# Patient Record
Sex: Female | Born: 1950 | Race: White | Hispanic: No | Marital: Married | Smoking: Never smoker
Health system: Southern US, Community
[De-identification: ages and names within clinical notes are randomized; demographics above are authoritative.]

## PROBLEM LIST (undated history)

## (undated) DIAGNOSIS — I351 Nonrheumatic aortic (valve) insufficiency: Secondary | ICD-10-CM

## (undated) DIAGNOSIS — I447 Left bundle-branch block, unspecified: Secondary | ICD-10-CM

## (undated) DIAGNOSIS — M810 Age-related osteoporosis without current pathological fracture: Secondary | ICD-10-CM

## (undated) DIAGNOSIS — R011 Cardiac murmur, unspecified: Secondary | ICD-10-CM

## (undated) DIAGNOSIS — K222 Esophageal obstruction: Secondary | ICD-10-CM

## (undated) DIAGNOSIS — Z8041 Family history of malignant neoplasm of ovary: Secondary | ICD-10-CM

## (undated) DIAGNOSIS — L309 Dermatitis, unspecified: Secondary | ICD-10-CM

## (undated) DIAGNOSIS — M797 Fibromyalgia: Secondary | ICD-10-CM

## (undated) DIAGNOSIS — H4020X0 Unspecified primary angle-closure glaucoma, stage unspecified: Secondary | ICD-10-CM

## (undated) DIAGNOSIS — M542 Cervicalgia: Secondary | ICD-10-CM

## (undated) DIAGNOSIS — I071 Rheumatic tricuspid insufficiency: Secondary | ICD-10-CM

## (undated) DIAGNOSIS — C50919 Malignant neoplasm of unspecified site of unspecified female breast: Secondary | ICD-10-CM

## (undated) DIAGNOSIS — Z808 Family history of malignant neoplasm of other organs or systems: Secondary | ICD-10-CM

## (undated) DIAGNOSIS — I700 Atherosclerosis of aorta: Secondary | ICD-10-CM

## (undated) DIAGNOSIS — I371 Nonrheumatic pulmonary valve insufficiency: Secondary | ICD-10-CM

## (undated) DIAGNOSIS — F419 Anxiety disorder, unspecified: Secondary | ICD-10-CM

## (undated) DIAGNOSIS — R918 Other nonspecific abnormal finding of lung field: Secondary | ICD-10-CM

## (undated) DIAGNOSIS — K219 Gastro-esophageal reflux disease without esophagitis: Secondary | ICD-10-CM

## (undated) DIAGNOSIS — Z800 Family history of malignant neoplasm of digestive organs: Secondary | ICD-10-CM

## (undated) DIAGNOSIS — N879 Dysplasia of cervix uteri, unspecified: Secondary | ICD-10-CM

## (undated) DIAGNOSIS — J309 Allergic rhinitis, unspecified: Secondary | ICD-10-CM

## (undated) DIAGNOSIS — K589 Irritable bowel syndrome without diarrhea: Secondary | ICD-10-CM

## (undated) DIAGNOSIS — K449 Diaphragmatic hernia without obstruction or gangrene: Secondary | ICD-10-CM

## (undated) DIAGNOSIS — I059 Rheumatic mitral valve disease, unspecified: Secondary | ICD-10-CM

## (undated) DIAGNOSIS — I493 Ventricular premature depolarization: Secondary | ICD-10-CM

## (undated) DIAGNOSIS — Z8049 Family history of malignant neoplasm of other genital organs: Secondary | ICD-10-CM

## (undated) DIAGNOSIS — I491 Atrial premature depolarization: Secondary | ICD-10-CM

## (undated) DIAGNOSIS — L719 Rosacea, unspecified: Secondary | ICD-10-CM

## (undated) DIAGNOSIS — Z8042 Family history of malignant neoplasm of prostate: Secondary | ICD-10-CM

## (undated) HISTORY — DX: Family history of malignant neoplasm of prostate: Z80.42

## (undated) HISTORY — DX: Allergic rhinitis, unspecified: J30.9

## (undated) HISTORY — DX: Malignant neoplasm of unspecified site of unspecified female breast: C50.919

## (undated) HISTORY — DX: Family history of malignant neoplasm of other organs or systems: Z80.8

## (undated) HISTORY — PX: ABDOMINAL HYSTERECTOMY: SHX81

## (undated) HISTORY — DX: Atherosclerosis of aorta: I70.0

## (undated) HISTORY — DX: Dermatitis, unspecified: L30.9

## (undated) HISTORY — DX: Family history of malignant neoplasm of other genital organs: Z80.49

## (undated) HISTORY — DX: Family history of malignant neoplasm of ovary: Z80.41

## (undated) HISTORY — PX: NECK SURGERY: SHX720

## (undated) HISTORY — DX: Other nonspecific abnormal finding of lung field: R91.8

## (undated) HISTORY — DX: Cardiac murmur, unspecified: R01.1

## (undated) HISTORY — DX: Fibromyalgia: M79.7

## (undated) HISTORY — DX: Gastro-esophageal reflux disease without esophagitis: K21.9

## (undated) HISTORY — DX: Left bundle-branch block, unspecified: I44.7

## (undated) HISTORY — DX: Rheumatic mitral valve disease, unspecified: I05.9

## (undated) HISTORY — DX: Nonrheumatic aortic (valve) insufficiency: I35.1

## (undated) HISTORY — DX: Ventricular premature depolarization: I49.3

## (undated) HISTORY — PX: EYE SURGERY: SHX253

## (undated) HISTORY — DX: Unspecified primary angle-closure glaucoma, stage unspecified: H40.20X0

## (undated) HISTORY — DX: Age-related osteoporosis without current pathological fracture: M81.0

## (undated) HISTORY — DX: Esophageal obstruction: K22.2

## (undated) HISTORY — DX: Rheumatic tricuspid insufficiency: I07.1

## (undated) HISTORY — DX: Diaphragmatic hernia without obstruction or gangrene: K44.9

## (undated) HISTORY — PX: REFRACTIVE SURGERY: SHX103

## (undated) HISTORY — DX: Cervicalgia: M54.2

## (undated) HISTORY — PX: PARACENTESIS: SHX844

## (undated) HISTORY — DX: Irritable bowel syndrome, unspecified: K58.9

## (undated) HISTORY — DX: Family history of malignant neoplasm of digestive organs: Z80.0

## (undated) HISTORY — PX: CERVICAL DISCECTOMY: SHX98

## (undated) HISTORY — DX: Nonrheumatic pulmonary valve insufficiency: I37.1

## (undated) HISTORY — DX: Atrial premature depolarization: I49.1

## (undated) HISTORY — DX: Dysplasia of cervix uteri, unspecified: N87.9

---

## 1974-03-17 HISTORY — PX: RHINOPLASTY: SUR1284

## 1975-03-18 HISTORY — PX: TUBAL LIGATION: SHX77

## 1978-03-17 DIAGNOSIS — N879 Dysplasia of cervix uteri, unspecified: Secondary | ICD-10-CM

## 1978-03-17 HISTORY — PX: GYNECOLOGIC CRYOSURGERY: SHX857

## 1978-03-17 HISTORY — DX: Dysplasia of cervix uteri, unspecified: N87.9

## 1996-03-17 HISTORY — PX: BREAST LUMPECTOMY: SHX2

## 1996-10-15 HISTORY — PX: OTHER SURGICAL HISTORY: SHX169

## 1997-12-19 ENCOUNTER — Other Ambulatory Visit: Admission: RE | Admit: 1997-12-19 | Discharge: 1997-12-19 | Payer: Self-pay | Admitting: Obstetrics and Gynecology

## 1999-01-31 ENCOUNTER — Encounter: Admission: RE | Admit: 1999-01-31 | Discharge: 1999-01-31 | Payer: Self-pay | Admitting: Neurosurgery

## 1999-01-31 ENCOUNTER — Encounter: Payer: Self-pay | Admitting: Neurosurgery

## 1999-02-14 ENCOUNTER — Other Ambulatory Visit: Admission: RE | Admit: 1999-02-14 | Discharge: 1999-02-14 | Payer: Self-pay | Admitting: Obstetrics and Gynecology

## 1999-04-22 ENCOUNTER — Encounter: Admission: RE | Admit: 1999-04-22 | Discharge: 1999-04-22 | Payer: Self-pay

## 2000-02-18 ENCOUNTER — Other Ambulatory Visit: Admission: RE | Admit: 2000-02-18 | Discharge: 2000-02-18 | Payer: Self-pay | Admitting: Obstetrics and Gynecology

## 2001-02-19 ENCOUNTER — Other Ambulatory Visit: Admission: RE | Admit: 2001-02-19 | Discharge: 2001-02-19 | Payer: Self-pay | Admitting: Obstetrics and Gynecology

## 2001-06-30 ENCOUNTER — Ambulatory Visit (HOSPITAL_COMMUNITY): Admission: RE | Admit: 2001-06-30 | Discharge: 2001-06-30 | Payer: Self-pay | Admitting: Gastroenterology

## 2001-06-30 ENCOUNTER — Encounter: Payer: Self-pay | Admitting: Gastroenterology

## 2001-07-30 ENCOUNTER — Encounter: Admission: RE | Admit: 2001-07-30 | Discharge: 2001-07-30 | Payer: Self-pay | Admitting: Gastroenterology

## 2001-07-30 ENCOUNTER — Encounter: Payer: Self-pay | Admitting: Gastroenterology

## 2002-03-04 ENCOUNTER — Other Ambulatory Visit: Admission: RE | Admit: 2002-03-04 | Discharge: 2002-03-04 | Payer: Self-pay | Admitting: Obstetrics and Gynecology

## 2003-03-07 ENCOUNTER — Other Ambulatory Visit: Admission: RE | Admit: 2003-03-07 | Discharge: 2003-03-07 | Payer: Self-pay | Admitting: Obstetrics and Gynecology

## 2004-01-06 ENCOUNTER — Ambulatory Visit (HOSPITAL_COMMUNITY): Admission: RE | Admit: 2004-01-06 | Discharge: 2004-01-06 | Payer: Self-pay | Admitting: Orthopedic Surgery

## 2004-01-17 ENCOUNTER — Encounter: Admission: RE | Admit: 2004-01-17 | Discharge: 2004-01-17 | Payer: Self-pay | Admitting: Orthopedic Surgery

## 2004-05-02 ENCOUNTER — Other Ambulatory Visit: Admission: RE | Admit: 2004-05-02 | Discharge: 2004-05-02 | Payer: Self-pay | Admitting: Obstetrics and Gynecology

## 2004-06-28 ENCOUNTER — Ambulatory Visit (HOSPITAL_COMMUNITY): Admission: RE | Admit: 2004-06-28 | Discharge: 2004-06-28 | Payer: Self-pay | Admitting: Orthopedic Surgery

## 2004-07-02 ENCOUNTER — Ambulatory Visit: Payer: Self-pay | Admitting: Gastroenterology

## 2004-07-08 ENCOUNTER — Ambulatory Visit: Payer: Self-pay | Admitting: Gastroenterology

## 2004-10-27 ENCOUNTER — Emergency Department (HOSPITAL_COMMUNITY): Admission: EM | Admit: 2004-10-27 | Discharge: 2004-10-27 | Payer: Self-pay | Admitting: Emergency Medicine

## 2005-06-24 ENCOUNTER — Other Ambulatory Visit: Admission: RE | Admit: 2005-06-24 | Discharge: 2005-06-24 | Payer: Self-pay | Admitting: Obstetrics and Gynecology

## 2005-09-09 ENCOUNTER — Ambulatory Visit: Payer: Self-pay | Admitting: Gastroenterology

## 2006-04-27 ENCOUNTER — Ambulatory Visit: Payer: Self-pay | Admitting: Gastroenterology

## 2006-05-13 ENCOUNTER — Ambulatory Visit: Payer: Self-pay | Admitting: Gastroenterology

## 2006-06-30 ENCOUNTER — Other Ambulatory Visit: Admission: RE | Admit: 2006-06-30 | Discharge: 2006-06-30 | Payer: Self-pay | Admitting: Obstetrics and Gynecology

## 2007-02-23 ENCOUNTER — Ambulatory Visit: Payer: Self-pay | Admitting: Gastroenterology

## 2007-03-23 ENCOUNTER — Ambulatory Visit: Payer: Self-pay | Admitting: Gastroenterology

## 2007-04-20 ENCOUNTER — Ambulatory Visit: Payer: Self-pay | Admitting: Gastroenterology

## 2007-04-20 LAB — CONVERTED CEMR LAB
Ferritin: 51.1 ng/mL (ref 10.0–291.0)
Folate: >20 ng/mL
Iron: 70 ug/dL (ref 42–145)
Saturation Ratios: 18.3 % — ABNORMAL LOW (ref 20.0–50.0)
Transferrin: 273.3 mg/dL (ref >=212.0)
Vitamin B-12: 630 pg/mL (ref 211–911)

## 2007-04-29 ENCOUNTER — Ambulatory Visit (HOSPITAL_COMMUNITY): Admission: RE | Admit: 2007-04-29 | Discharge: 2007-04-29 | Payer: Self-pay | Admitting: Gastroenterology

## 2007-05-04 ENCOUNTER — Ambulatory Visit (HOSPITAL_COMMUNITY): Admission: RE | Admit: 2007-05-04 | Discharge: 2007-05-04 | Payer: Self-pay | Admitting: Family Medicine

## 2007-07-20 ENCOUNTER — Other Ambulatory Visit: Admission: RE | Admit: 2007-07-20 | Discharge: 2007-07-20 | Payer: Self-pay | Admitting: Obstetrics and Gynecology

## 2008-04-17 DIAGNOSIS — M542 Cervicalgia: Secondary | ICD-10-CM | POA: Insufficient documentation

## 2008-04-17 DIAGNOSIS — Z853 Personal history of malignant neoplasm of breast: Secondary | ICD-10-CM | POA: Insufficient documentation

## 2008-04-17 DIAGNOSIS — K449 Diaphragmatic hernia without obstruction or gangrene: Secondary | ICD-10-CM | POA: Insufficient documentation

## 2008-04-17 DIAGNOSIS — C50919 Malignant neoplasm of unspecified site of unspecified female breast: Secondary | ICD-10-CM

## 2008-04-17 DIAGNOSIS — K222 Esophageal obstruction: Secondary | ICD-10-CM | POA: Insufficient documentation

## 2008-04-17 DIAGNOSIS — I341 Nonrheumatic mitral (valve) prolapse: Secondary | ICD-10-CM | POA: Insufficient documentation

## 2008-04-17 DIAGNOSIS — I059 Rheumatic mitral valve disease, unspecified: Secondary | ICD-10-CM | POA: Insufficient documentation

## 2008-04-17 DIAGNOSIS — G609 Hereditary and idiopathic neuropathy, unspecified: Secondary | ICD-10-CM | POA: Insufficient documentation

## 2008-04-18 ENCOUNTER — Ambulatory Visit: Payer: Self-pay | Admitting: Gastroenterology

## 2008-04-24 ENCOUNTER — Ambulatory Visit (HOSPITAL_COMMUNITY): Admission: RE | Admit: 2008-04-24 | Discharge: 2008-04-24 | Payer: Self-pay | Admitting: Neurosurgery

## 2008-04-25 ENCOUNTER — Encounter: Payer: Self-pay | Admitting: Gastroenterology

## 2008-05-04 ENCOUNTER — Ambulatory Visit (HOSPITAL_COMMUNITY): Admission: RE | Admit: 2008-05-04 | Discharge: 2008-05-05 | Payer: Self-pay | Admitting: Neurosurgery

## 2008-05-05 ENCOUNTER — Telehealth: Payer: Self-pay | Admitting: Gastroenterology

## 2008-05-08 ENCOUNTER — Telehealth: Payer: Self-pay | Admitting: Gastroenterology

## 2008-08-02 ENCOUNTER — Encounter: Payer: Self-pay | Admitting: Obstetrics and Gynecology

## 2008-08-02 ENCOUNTER — Other Ambulatory Visit: Admission: RE | Admit: 2008-08-02 | Discharge: 2008-08-02 | Payer: Self-pay | Admitting: Obstetrics and Gynecology

## 2008-08-02 ENCOUNTER — Ambulatory Visit: Payer: Self-pay | Admitting: Obstetrics and Gynecology

## 2008-08-03 ENCOUNTER — Ambulatory Visit: Payer: Self-pay | Admitting: Obstetrics and Gynecology

## 2009-06-26 ENCOUNTER — Ambulatory Visit: Payer: Self-pay | Admitting: Gastroenterology

## 2009-06-26 LAB — CONVERTED CEMR LAB
Ferritin: 62.6 ng/mL (ref 10.0–291.0)
Folate: 20 ng/mL
Iron: 86 ug/dL (ref 42–145)
Saturation Ratios: 20.7 % (ref 20.0–50.0)
Transferrin: 297 mg/dL (ref 212.0–360.0)
Vitamin B-12: 528 pg/mL (ref 211–911)

## 2009-06-28 ENCOUNTER — Encounter: Payer: Self-pay | Admitting: Gastroenterology

## 2009-07-12 ENCOUNTER — Telehealth: Payer: Self-pay | Admitting: Gastroenterology

## 2009-08-07 ENCOUNTER — Ambulatory Visit: Payer: Self-pay | Admitting: Obstetrics and Gynecology

## 2009-08-07 ENCOUNTER — Other Ambulatory Visit: Admission: RE | Admit: 2009-08-07 | Discharge: 2009-08-07 | Payer: Self-pay | Admitting: Obstetrics and Gynecology

## 2009-10-03 ENCOUNTER — Encounter: Payer: Self-pay | Admitting: Gastroenterology

## 2009-10-03 ENCOUNTER — Telehealth: Payer: Self-pay | Admitting: Gastroenterology

## 2009-10-04 ENCOUNTER — Telehealth: Payer: Self-pay | Admitting: Gastroenterology

## 2009-10-11 ENCOUNTER — Ambulatory Visit: Payer: Self-pay | Admitting: Obstetrics and Gynecology

## 2009-11-07 ENCOUNTER — Ambulatory Visit: Payer: Self-pay | Admitting: Obstetrics and Gynecology

## 2009-11-13 ENCOUNTER — Ambulatory Visit: Payer: Self-pay | Admitting: Obstetrics and Gynecology

## 2009-11-15 ENCOUNTER — Ambulatory Visit: Payer: Self-pay | Admitting: Obstetrics and Gynecology

## 2009-11-21 ENCOUNTER — Ambulatory Visit: Payer: Self-pay | Admitting: Obstetrics and Gynecology

## 2010-03-17 DIAGNOSIS — I491 Atrial premature depolarization: Secondary | ICD-10-CM

## 2010-03-17 HISTORY — DX: Atrial premature depolarization: I49.1

## 2010-04-06 ENCOUNTER — Encounter: Payer: Self-pay | Admitting: Orthopedic Surgery

## 2010-04-06 ENCOUNTER — Encounter: Payer: Self-pay | Admitting: General Surgery

## 2010-04-18 NOTE — Progress Notes (Signed)
Summary: Triage  Medications Added SUCRALFATE 1 GM/10ML SUSP (SUCRALFATE) 1 gm three times a day after meals, Take one day before taking Atelvia and 3 days after.       Phone Note Outgoing Call   Call placed by: Ashok Cordia RN,  October 03, 2009 3:22 PM Summary of Call: Received note from pt.  Gyn has put her on Atevia (risedronate sodium) 35 mg weekly.  Pt is having an uncomfortable feeling in esophagus for a few days after taking med.  Asking if Dr. Jarold Motto thinks it is OK for pt to take this medication? Initial call taken by: Ashok Cordia RN,  October 03, 2009 3:24 PM  Follow-up for Phone Call        Per Dr. Jarold Motto:  Take carafate 1 gm pc three times a day for 1 day before and 3 days after taking Rx. Follow-up by: Ashok Cordia RN,  October 03, 2009 3:25 PM  Additional Follow-up for Phone Call Additional follow up Details #1::        LM for pt to call.  Lupita Leash Surface RN  October 03, 2009 3:25 PM  Pt notified.  Will try carafate and report back. Additional Follow-up by: Ashok Cordia RN,  October 03, 2009 3:46 PM    New/Updated Medications: SUCRALFATE 1 GM/10ML SUSP (SUCRALFATE) 1 gm three times a day after meals, Take one day before taking Atelvia and 3 days after. Prescriptions: SUCRALFATE 1 GM/10ML SUSP (SUCRALFATE) 1 gm three times a day after meals, Take one day before taking Atelvia and 3 days after.  #14 oz x 3   Entered by:   Ashok Cordia RN   Authorized by:   Mardella Layman MD [facility]   Signed by:   Ashok Cordia RN on 10/03/2009   Method used:   [facility]  [name] [address] [facility]       [name]       [place], [place]  [address]       Ph: [phone] or [phone]       Fax: [phone]   RxID:   [id number]

## 2010-04-18 NOTE — Medication Information (Signed)
Summary: Approved/[employer]  Approved/[employer]   Imported By: Lester Bronson 07/04/2009 08:26:16  _____________________________________________________________________  External Attachment:    Type:   Image     Comment:   External Document

## 2010-04-18 NOTE — Procedures (Signed)
Summary: Egd   EGD  Procedure date:  06/30/2001  Findings:      Location: [facility]    Patient Name: [name] [name], [name] [name] MRN: [id number] Procedure Procedures: Panendoscopy (EGD) CPT: 43235.    with esophageal dilation. CPT: G9296129.  Personnel: Endoscopist: [name]. Jarold Motto, MD.  Exam Location: Exam performed in Endoscopy Suite.  Patient Consent: Procedure, Alternatives, Risks and Benefits discussed, consent obtained,  Indications Symptoms: Dysphagia. Reflux symptoms  History  Pre-Exam Physical: Performed Jun 30, 2001  Cardio-pulmonary exam, Abdominal exam, Extremity exam, Mental status exam WNL.  Exam Exam Info: Maximum depth of insertion Duodenum, intended Duodenum. Patient position: on left side. Duration of exam: 20 minutes. Vocal cords visualized. Gastric retroflexion performed. Images taken. ASA Classification: I. Tolerance: excellent.  Sedation Meds: Cetacaine Spray 2 sprays Versed 8 mg.  Monitoring: BP and pulse monitoring done. Oximetry used. Supplemental O2 given  Fluoroscopy: Fluoroscopy was not used.  Instrument(s): PCF 140L. Serial [id number].   Findings - Normal: Proximal Esophagus.  - HIATAL HERNIA: Prolapsing, 6 cms. in length. ICD9: Hernia, Hiatal: 553.3. - STRICTURE / STENOSIS: Distal Esophagus.  Constriction: partial. Lumen diameter is 15 mm. ICD9: Esophageal Stricture: 530.3.  - Dilation: Distal Esophagus. for esophageal stricture. Savary dilator used, Diameter: 18 mm, No Resistance, No Heme present on extraction. 1  total dilators used. Patient tolerance excellent. Outcome: successful.  - Normal: Body to Duodenal 2nd Portion. Not Seen: Ulcer. Mucosal abnormality.   Assessment  Diagnoses: 553.3: Hernia, Hiatal. GERD.  530.3: Esophageal Stricture.   Events  Unplanned Intervention: No unplanned interventions were required.  Plans Medication(s): PPI: Omeprazole/Prilosec 40 mg prn, starting Jun 30, 2001 for indefinitely.    Patient Education: Patient given standard instructions for: Reflux. Stenosis / Stricture. Soft diet for 24 hours.  Disposition: After procedure patient sent to recovery.  Scheduling: Call office for appointment, to [name]. Jarold Motto, MD, around Jul 30, 2001.    This report was created from the original endoscopy report, which was reviewed and signed by the above listed endoscopist.

## 2010-04-18 NOTE — Assessment & Plan Note (Signed)
Summary: YEARLY FUP FOR PROTONIX REFILL/YF    History of Present Illness Visit Type: Follow-up Visit Primary GI MD: Sheryn Bison MD FACP FAGA Primary Provider: Foy Guadalajara Chief Complaint: Patient here for yearly follow up of her reflux and needs Protonix refills. History of Present Illness:   [name] [name] has Raynaud's phenomenon and chronic GERD controlled on Protonix 40 mg one to 2 times a day. She recently has been bothered with Roscea in the care of Dr. Dorinda Hill. She has multiple drug allergies to various antibiotics. 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 Rapid good her weight is stable. She denies hepatobiliary or lower gastrointestinal complaints.   GI Review of Systems    Reports acid reflux.      Denies abdominal pain, belching, bloating, chest pain, dysphagia with liquids, dysphagia with solids, heartburn, loss of appetite, nausea, vomiting, vomiting blood, weight loss, and  weight gain.        Denies anal fissure, black tarry stools, change in bowel habit, constipation, diarrhea, diverticulosis, fecal incontinence, heme positive stool, hemorrhoids, irritable bowel syndrome, jaundice, light color stool, liver problems, rectal bleeding, and  rectal pain.    Current Medications (verified): 1)  Protonix 40 Mg Pack (Pantoprazole Sodium) .... Take One By Mouth Two Times A Day 2)  Align  Caps (Misc Intestinal Flora Regulat) .... Once Daily 3)  Caltrate 600+d 600-400 Mg-Unit Tabs (Calcium Carbonate-Vitamin D) .... Take One By Mouth Two Times A Day 4)  Vitamin C Cr 500 Mg Cr-Caps (Ascorbic Acid) .... Once Daily 5)  Protopic 0.1 % Oint (Tacrolimus) .... Apply To Face At Bedtime 6)  Vitamin D3 400 Unit Tabs (Cholecalciferol) .... Take One By Mouth Two Times A Day 7)  B Complex  Tabs (B Complex Vitamins) .... Take One By Mouth Once Daily 8)  Nasalcrom 5.2 Mg/act Aers (Cromolyn  Sodium) .... One Spray One Nostril  Three Times A Day 9)  Rhinocort Aqua 32 Mcg/act Susp (Budesonide) .... One Spray One Nostril Once A Day 10)  Ketoconazole 2 % Crea (Ketoconazole) .... Apply To Face Once A Day  Allergies: 1)  ! Penicillin 2)  ! Sulfa 3)  ! * Emycin 4)  ! Flagyl 5)  ! Doxycycline 6)  ! Quinaglute 7)  ! * Thimerosal 8)  ! * Tramadol 9)  ! * Ditropan 10)  ! * Mincycline  Past History:  Past medical, surgical, family and social histories (including risk factors) reviewed for relevance to current acute and chronic problems.  Past Medical History: Reviewed history from 04/18/2008 and no changes required. Current Problems:  MITRAL VALVE PROLAPSE (ICD-424.0) DEPRESSION (ICD-311) ADENOCARCINOMA, BREAST, RIGHT (ICD-174.9) NECK PAIN (ICD-723.1) PERIPHERAL NEUROPATHY (ICD-356.9) OSTEOPOROSIS (ICD-733.00) GERD (ICD-530.81) IBS (ICD-564.1) ESOPHAGEAL STRICTURE (ICD-530.3) HIATAL HERNIA (ICD-553.3)  Past Surgical History: neck surgery with bone graft and fusion [id number] Rhinoplasty 1976 Tubal Ligation 1977 Cyrosurgery 1980 Right breast lumpectomy with radiation 1998 Laser surgery for narrow angle glucoma anterior cervical discectomy c3-4 & c4-5 with bone graft and fusion  Family History: Reviewed history from 04/17/2008 and no changes required. Family History of Pancreatic Cancer:grandfather Family History of Uterine Cancer:sister Family History of Heart Disease: mother, grandfather, father, sister No FH of Colon Cancer Family History of ETOH Abuse: Sister, mother Family History of Depression: mother, sister sister deceased of melanoma  Social History: Reviewed history from 04/17/2008 and no changes required. Married Patient has never smoked.  Alcohol Use - yes Illicit Drug Use -  no  Review of Systems       The patient complains of back pain.  The patient denies allergy/sinus, anemia, anxiety-new, arthritis/joint pain, blood in urine, breast  changes/lumps, change in vision, confusion, cough, coughing up blood, depression-new, fainting, fatigue, fever, headaches-new, hearing problems, heart murmur, heart rhythm changes, itching, menstrual pain, muscle pains/cramps, night sweats, nosebleeds, pregnancy symptoms, shortness of breath, skin rash, sleeping problems, sore throat, swelling of feet/legs, swollen lymph glands, thirst - excessive , urination - excessive , urination changes/pain, urine leakage, vision changes, and voice change.         She has had neck fusion surgery also occasions and does have some mild dysphasia and her upper esophageal area which is chronic in nature.  Vital Signs:  Patient profile:   60 year old female Height:      65 inches Weight:      113.2 pounds BMI:     18.91 Pulse rate:   880 / minute Pulse rhythm:   regular BP sitting:   140 / 66  (left arm) Cuff size:   regular  Vitals Entered By: Harlow Mares CMA Duncan Dull) (June 26, 2009 1:50 PM)  Physical Exam  General:  Well developed, well nourished, no acute distress.healthy appearing and tall statured.  healthy appearing.   Head:  Normocephalic and atraumatic. Eyes:  PERRLA, no icterus.exam deferred to patient's ophthalmologist.  exam deferred to patient's ophthalmologist.   Neck:  Neck deformity with anterior displacement of her cartilage. I cannot appreciate thyromegaly or other masses or tenderness. Abdomen:  Soft, nontender and nondistended. No masses, hepatosplenomegaly or hernias noted. Normal bowel sounds. Extremities:  No clubbing, cyanosis, edema or deformities noted. Neurologic:  Alert and  oriented x4;  grossly normal neurologically. Skin:  Changes of facial telangiectasias bilaterally over the malar areas. I cannot appreciate other skin signs of scleroderma. Psych:  Alert and cooperative. Normal mood and affect.anxious.     Impression & Recommendations:  Problem # 1:  GERD (ICD-530.81) Assessment Improved Continue antireflux maneuvers  and daily Protonix generic and twice a day as needed. I do not think followup endoscopy indicated this time. We will check B12 level per chronic use of PPI medications. 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 Orders: TLB-B12, Serum-Total ONLY ([address]-U04) TLB-Ferritin (82728-FER) TLB-Folic Acid (Folate) (82746-FOL) TLB-IBC Pnl (Iron/FE;Transferrin) (83550-IBC)  Problem # 3:  IBS (ICD-564.1) Assessment: Improved  Patient Instructions: 1)  Please go to the basement for lab work. 2)  Take pantoprazole two times a day. 3)  take Xifaxin two times a day for 10 days. 4)  The medication list was reviewed and reconciled.  All changed / newly prescribed medications were explained.  A complete medication list was provided to the patient / caregiver. 5)  Copy sent to : Dr. Dorinda Hill in dermatology and Dr. Marinda Elk in primary care. 6)  Please continue current medications.  7)  Avoid foods high in acid content ( tomatoes, citrus juices, spicy foods) . Avoid eating within 3 to 4 hours of lying down or before exercising. Do not over eat; try smaller more frequent meals. Elevate head of bed four inches when sleeping.   Appended Document: YEARLY FUP FOR PROTONIX REFILL/YF    Clinical Lists Changes  Medications: Added new medication of XIFAXAN 550 MG TABS (RIFAXIMIN)  1 by mouth two times a day - Signed Changed medication from PROTONIX 40 MG PACK (PANTOPRAZOLE SODIUM) take one by mouth two times a day to PANTOPRAZOLE SODIUM 40 MG TBEC (PANTOPRAZOLE SODIUM) 1 by mouth two times a day - Signed Rx of XIFAXAN 550 MG TABS (RIFAXIMIN) 1 by mouth two times a day;  #28 x 0;  Signed;  Entered by: Ashok Cordia RN;  Authorized by: Mardella Layman MD [facility];  Method used: Print  then Give to Patient Rx of PANTOPRAZOLE SODIUM 40 MG TBEC (PANTOPRAZOLE SODIUM) 1 by mouth two times a day;  #60 x 11;  Signed;  Entered by: Ashok Cordia RN;  Authorized by: Mardella Layman MD [facility];  Method used: Print then Give to Patient Rx of PANTOPRAZOLE SODIUM 40 MG TBEC (PANTOPRAZOLE SODIUM) 1 by mouth two times a day;  #60 x 11;  Signed;  Entered by: Ashok Cordia RN;  Authorized by: Mardella Layman MD [facility];  Method used[facility]  [name] [address] [name], [place], [place]  [address], Ph: [phone] or [phone], Fax: [phone]    Prescriptions: PANTOPRAZOLE SODIUM 40 MG TBEC (PANTOPRAZOLE SODIUM) 1 by mouth two times a day  #60 x 11   Entered by:   Ashok Cordia RN   Authorized by:   Mardella Layman MD [facility]   Signed by:   Ashok Cordia RN on 06/26/2009   Method used:   [facility]        [facility]  [name] [address] ([facility]ail)       [name]       [place], [place]  [address]       Ph: [phone] or [phone]       Fax: [phone]   RxID:   [id number] PANTOPRAZOLE SODIUM 40 MG TBEC (PANTOPRAZOLE SODIUM) 1 by mouth two times a day  #60 x 11   Entered by:   Ashok Cordia RN   Authorized by:   Mardella Layman MD [facility]   Signed by:   Ashok Cordia RN on 06/26/2009   Method used:   Print then Give to Patient   RxID:   [id number] TDDUKGU 550 MG TABS (RIFAXIMIN) 1 by mouth two times a day  #28 x 0   Entered by:   Ashok Cordia RN   Authorized by:   Mardella Layman MD [facility]   Signed by:   Ashok Cordia RN on 06/26/2009   Method used:   Print then Give to Patient   RxID:   [id number]

## 2010-04-18 NOTE — Assessment & Plan Note (Signed)
Summary: YEARLY F-UP/YF  Medications Added PROTONIX 40 MG PACK (PANTOPRAZOLE SODIUM) Take  every morning Eat 1/2 hour afterwards PROTONIX 40 MG PACK (PANTOPRAZOLE SODIUM) take one by mouth two times a day ALIGN  CAPS (MISC INTESTINAL FLORA REGULAT) once daily CALCIUM 500/D 500-400 MG-UNIT CHEW (CALCIUM-VITAMIN D) two times a day VITAMIN C CR 500 MG CR-CAPS (ASCORBIC ACID) once daily FINACEA 15 % GEL (AZELAIC ACID) at bedtime for rosacea PROTOPIC 0.1 % OINT (TACROLIMUS) Apply to face at bedtime        History of Present Illness Visit Type: follow up Primary GI MD: Sheryn Bison MD FACP FAGA Primary Provider: Foy Guadalajara Chief Complaint: follow-up visit/Yearly History of Present Illness:   [name] [name] is doing markedly well on probiotic therapy and daily Protonix. She will occasionally have acid reflux which responds to twice a day Protonix therapy. She has no associated dysphasia. Her lower bowel problems have abated with probiotic therapy and removal of fructose-sorbitol from her diet. She is to have cervical disc surgery for the third time in the next several months. She may need solutabs for acid control at that time if she has dysphasia. She denies nocturnal reflux symptoms but has not had previous 24-hour pH probe monitor sedation. Her dentist apparently feels that some of her enamel erosion is from GERD.   GI Review of Systems    Reports acid reflux and  heartburn.      Denies abdominal pain, belching, bloating, chest pain, dysphagia with liquids, dysphagia with solids, loss of appetite, nausea, vomiting, vomiting blood, weight loss, and  weight gain.        Denies anal fissure, black tarry stools, change in bowel habit, constipation, diarrhea, diverticulosis, fecal incontinence, heme positive stool, hemorrhoids, irritable bowel syndrome, jaundice, light color stool, liver problems, rectal bleeding, and  rectal pain.     Prior Medications Reviewed Using: List Brought by Patient  Updated  Prior Medication List: PROTONIX 40 MG PACK (PANTOPRAZOLE SODIUM) Take  every morning Eat 1/2 hour afterwards ALIGN  CAPS (MISC INTESTINAL FLORA REGULAT) once daily CALCIUM 500/D 500-400 MG-UNIT CHEW (CALCIUM-VITAMIN D) two times a day VITAMIN C CR 500 MG CR-CAPS (ASCORBIC ACID) once daily FINACEA 15 % GEL (AZELAIC ACID) at bedtime for rosacea PROTOPIC 0.1 % OINT (TACROLIMUS) Apply to face at bedtime  Current Allergies (reviewed today): ! PENICILLIN ! SULFA ! [facility] ! FLAGYL ! DOXYCYCLINE ! QUINAGLUTE ! * THIMEROSAL ! * TRAMADOL ! * DITROPAN  Past Medical History:    Reviewed history and no changes required:       Current Problems:        MITRAL VALVE PROLAPSE (ICD-424.0)       DEPRESSION (ICD-311)       ADENOCARCINOMA, BREAST, RIGHT (ICD-174.9)       NECK PAIN (ICD-723.1)       PERIPHERAL NEUROPATHY (ICD-356.9)       OSTEOPOROSIS (ICD-733.00)       GERD (ICD-530.81)       IBS (ICD-564.1)       ESOPHAGEAL STRICTURE (ICD-530.3)       HIATAL HERNIA (ICD-553.3)         Past Surgical History:    Reviewed history from 04/17/2008 and no changes required:       neck surgery with bone graft and fusion [id number]       Rhinoplasty 1976       Tubal Ligation 1977       Cyrosurgery 1980       Right breast lumpectomy  with radiation 1998       Laser surgery for narrow angle glucoma   Family History:    Reviewed history from 04/17/2008 and no changes required:       Family History of Pancreatic Cancer:grandfather       Family History of Uterine Cancer:sister       Family History of Heart Disease: mother, grandfather, father, sister       No FH of Colon Cancer       Family History of ETOH Abuse: Sister, mother       Family History of Depression: mother, sister  Social History:    Reviewed history from 04/17/2008 and no changes required:       Married       Patient has never smoked.        Alcohol Use - yes       Illicit Drug Use - no   Risk Factors:  Passive smoke  exposure:  no Caffeine use:  0 drinks per day    Type:  wine or beer    Drinks per day:  <1 Exercise:  no Seatbelt use:  100 %   Review of Systems  The patient denies allergy/sinus, anemia, anxiety-new, arthritis/joint pain, back pain, blood in urine, breast changes/lumps, change in vision, confusion, cough, coughing up blood, depression-new, fainting, fatigue, fever, headaches-new, hearing problems, heart murmur, heart rhythm changes, itching, menstrual pain, muscle pains/cramps, night sweats, nosebleeds, pregnancy symptoms, shortness of breath, skin rash, sleeping problems, sore throat, swelling of feet/legs, swollen lymph glands, thirst - excessive , urination - excessive , urination changes/pain, urine leakage, vision changes, and voice change.     Vital Signs:  Patient Profile:   60 Years Old Female Height:     65 inches Weight:      120.25 pounds BMI:     20.08 Pulse rate:   72 / minute Pulse rhythm:   regular BP sitting:   112 / 70  (left arm)  Vitals Entered By: June McMurray CMA (April 18, 2008 1:43 PM)                  Physical Exam  General:     Well developed, well nourished, no acute distress.healthy appearing.   Psych:     Alert and cooperative. Normal mood and affect.    Impression & Recommendations:  Problem # 1:  GERD (ICD-530.81) Assessment: Improved Continue current regime with Protonix 40 mg 30 minutes before breakfast twice a day if needed.  Problem # 2:  IBS (ICD-564.1) Assessment: Improved Continue probiotic therapy as tolerated.   Patient Instructions: 1)  Copy Sent To:Dr. Marinda Elk.Marland KitchenMarland Kitchen 2)  Please Continue current medications. 3)  Avoid foods high in acid content (tomatoes, citrus juices, spicy foods). Avoid eating within 3 to 4 hours of lying down or before exercising. Do not over eat; try smaller more frequent meals. Elevate head of bed four inches when sleeping. 4)  Consider changing to Kapidex 60 mg q.a.m. if needed for longer pH  acid control.    Prescriptions: PROTONIX 40 MG PACK (PANTOPRAZOLE SODIUM) take one by mouth two times a day  #60 x 3   Entered by:   Harlow Mares CMA   Authorized by:   Mardella Layman MD FACG,FAGA   Signed by:   Harlow Mares CMA on 04/18/2008   Method used:   [facility]        [facility]  [name] [address] ([facility])  [name]       [place], [place]  [address]       Ph: [phone] or [phone]       Fax: [phone]   RxID:   [id number]

## 2010-04-18 NOTE — Progress Notes (Signed)
Summary: RX/Triage  Medications Added PREVACID SOLUTAB 30 MG TBDP (LANSOPRAZOLE) take one by mouth once daily       Phone Note Call from Patient Call back at Home Phone [phone]   Caller: Patient Call For: [name] [name]  Reason for Call: Talk to Nurse Details for Reason: RX / triage  Summary of Call: pt just had neck surgery having trouble swallowing  and was told that Dr would be happy to call in Prevacid solutab. [facility]  [name] [address]Marland KitchenMarland Kitchenplease call pt when done  Initial call taken by: Guadlupe Spanish [facility],  May 05, 2008 11:48 AM  Follow-up for Phone Call        rx sent... No Answer at patient home number Follow-up by: Harlow Mares CMA,  May 05, 2008 12:05 PM    New/Updated Medications: PREVACID SOLUTAB 30 MG TBDP (LANSOPRAZOLE) take one by mouth once daily   Prescriptions: PREVACID SOLUTAB 30 MG TBDP (LANSOPRAZOLE) take one by mouth once daily  #30 x 1   Entered by:   Harlow Mares CMA   Authorized by:   Mardella Layman MD FACG,FAGA   Signed by:   Harlow Mares CMA on 05/05/2008   Method used:   [facility]        [facility]  [name] [address] ([facility]ail)       [name]       [place], [place]  [address]       Ph: [phone] or [phone]       Fax: [phone]   RxID:   [id number]

## 2010-04-18 NOTE — Progress Notes (Signed)
Summary: stop prior authi   Phone Note Call from Patient Call back at Home Phone [phone]   Caller: Patient Call For: [name] [name]  Reason for Call: Talk to Nurse Details for Reason: STOP prior autho Summary of Call: would like to STOP prior autho on Prevacid Solutab. pt can now swallow pills Initial call taken by: Guadlupe Spanish [facility],  May 08, 2008 8:10 AM  Follow-up for Phone Call        noted and removed from med list. Follow-up by: Harlow Mares CMA,  May 10, 2008 8:31 AM

## 2010-04-18 NOTE — Progress Notes (Signed)
Summary: results request   Phone Note Call from Patient Call back at Home Phone [phone]   Caller: Patient Call For: Dr. Jarold Motto Reason for Call: Lab or Test Results Summary of Call: would like labwork results from early April... also has questions about Xifaxan rx Initial call taken by: Vallarie Mare,  July 12, 2009 2:16 PM  Follow-up for Phone Call        yesPt given lab results.  Req a copy of lab work.  this is faxed to pt. Pt  was given rx for xifaxin when last here.  She has a hard time swallowing pills.  THe xifaxin 550 tabs are large pills.  Pt wanted to know if she can cut pills in half? If not can she be given a smaller dose that may be a smaller tab? Follow-up by: Ashok Cordia RN,  July 12, 2009 4:33 PM  Additional Follow-up for Phone Call Additional follow up Details #1::        pt called back wishing to speak with Lupita Leash about "something they discussed yesterday" Additional Follow-up by: Vallarie Mare,  July 13, 2009 8:16 AM    Additional Follow-up for Phone Call Additional follow up Details #2::    yes.Marland Kitchenoffice visit if nreeded Follow-up by: Mardella Layman MD Clementeen Graham,  July 13, 2009 8:16 AM  Additional Follow-up for Phone Call Additional follow up Details #3:: Details for Additional Follow-up Action Taken: pt. called back and wants labwork faxed to her @ [phone]. Said she did not receive them yesterday   Labs faxed again.  Pt notified re xifaxin.   Ashok Cordia RN  July 13, 2009 12:41 PM Additional Follow-up by: Karna Christmas,  July 13, 2009 12:36 PM

## 2010-04-18 NOTE — Medication Information (Signed)
Summary: PROTONIX Approved til 04/24/09/[employer]  PROTONIX Approved til 04/24/09/[employer]   Imported By: Lester Savona 05/10/2008 09:27:41  _____________________________________________________________________  External Attachment:    Type:   Image     Comment:   External Document

## 2010-04-18 NOTE — Progress Notes (Signed)
Summary: med ?'s  Medications Added * GI COCKTAIL (EQUAL PARTS MAALOX, DONNATAL, XYLOCIANE) 1 Tbsp 1 6-8 hrs as needed       Phone Note Call from Patient Call back at [phone]   Caller: Patient Call For: Dr. Jarold Motto Reason for Call: Talk to Nurse Summary of Call: has more questions regarding Carafate Initial call taken by: Vallarie Mare,  October 04, 2009 3:25 PM  Follow-up for Phone Call        Se phone note from 10/03/09.  Pt states shc can not afford to take the carafate.  Asking if there is any thing else she can take?  Per pharmacist, no generic is available for carefate susp.  Pt also forgot to tell Dr. Jarold Motto that she is on a six month treatment of cephelaxin from dermatologist.  Takes 500 mg two times a day. Follow-up by: Ashok Cordia RN,  October 04, 2009 3:54 PM    Additional Follow-up for Phone Call Additional follow up Details #2::    gi cocktail as needed... Follow-up by: Mardella Layman MD FACG,  October 05, 2009 8:41 AM  Additional Follow-up for Phone Call Additional follow up Details #3:: Details for Additional Follow-up Action Taken: Pt notified.  Rx sent. Additional Follow-up by: Ashok Cordia RN,  October 05, 2009 2:36 PM  New/Updated Medications: * GI COCKTAIL (EQUAL PARTS MAALOX, DONNATAL, XYLOCIANE) 1 Tbsp 1 6-8 hrs as needed Prescriptions: GI COCKTAIL (EQUAL PARTS MAALOX, DONNATAL, XYLOCIANE) 1 Tbsp 1 6-8 hrs as needed  #200 cc x 2   Entered by:   Ashok Cordia RN   Authorized by:   Mardella Layman MD [facility]   Signed by:   Ashok Cordia RN on 10/05/2009   Method used:   Faxed to ...       [facility]  [name] [address] (retail)       [name]       [place], [place]  [address]       Ph: [phone] or [phone]       Fax: [phone]   RxID:   [id number]

## 2010-04-18 NOTE — Procedures (Signed)
Summary: Colon   Colonoscopy  Procedure date:  05/13/2006  Findings:      [facility]   [facility]    Patient Name: [name] [name], [name] [name] MRN: [id number] Procedure Procedures: Colonoscopy CPT: 13086.  Personnel: Endoscopist: [name]. Jarold Motto, MD.  Exam Location: Exam performed in Outpatient Clinic. Outpatient  Patient Consent: Procedure, Alternatives, Risks and Benefits discussed, consent obtained, from patient. Consent was obtained by the RN.  Indications  Average Risk Screening Routine.  History  Current Medications: Patient is not currently taking Coumadin.  Medical/ Surgical History: Breast Cancer, Osteoporesis, Reflux Disease,  Pre-Exam Physical: Performed May 13, 2006. Cardio-pulmonary exam, Abdominal exam, Extremity exam, Mental status exam WNL.  Comments: Pt. history reviewed/updated, physical exam performed prior to initiation of sedation?yes Exam Exam: Extent of exam reached: Cecum, extent intended: Cecum.  The cecum was identified by appendiceal orifice and IC valve. Patient position: on left side. Time to Cecum: 00:05:05. Time for Withdrawl: 00:04:15. Colon retroflexion performed. Images taken. ASA Classification: II. Tolerance: excellent.  Monitoring: Pulse and BP monitoring, Oximetry used. Supplemental O2 given. at 2 Liters.  Colon Prep Used Golytely for colon prep. Prep results: excellent.  Sedation Meds: Patient assessed and found to be appropriate for moderate (conscious) sedation. Versed 7 mg. given IV. Benadryl 25 given IV.  Instrument(s): CF 140L. Serial [id number].  Findings - NORMAL EXAM: Cecum to Rectum. Not Seen: Polyps. AVM's. Colitis. Tumors. Melanosis. Crohn's. Diverticulosis.   Assessment Normal examination.  Events  Unplanned Interventions: No intervention was required.  Plans Medication Plan: Referring provider to order medications.  Patient Education: Patient given standard instructions for: a normal exam.   Disposition: After procedure patient sent to recovery. After recovery patient sent home.  Scheduling/Referral: Follow-Up prn.    CC: Teena Irani. Arlyce Dice, MD   This report was created from the original endoscopy report, which was reviewed and signed by the above listed endoscopist.

## 2010-04-18 NOTE — Letter (Signed)
Summary: Response to patient  Response to patient   Imported By: Lester [place] 10/08/2009 08:44:29  _____________________________________________________________________  External Attachment:    Type:   Image     Comment:   External Document

## 2010-07-02 LAB — CBC
HCT: 36.3 % (ref 36.0–46.0)
HCT: 36.4 % (ref 36.0–46.0)
Hemoglobin: 12.5 g/dL (ref 12.0–15.0)
Hemoglobin: 12.8 g/dL (ref 12.0–15.0)
MCHC: 34.4 g/dL (ref 30.0–36.0)
MCHC: 35 g/dL (ref 30.0–36.0)
MCV: 92.1 fL (ref 78.0–100.0)
MCV: 91.6 fL (ref 78.0–100.0)
Platelets: 351 10*3/uL (ref 150–400)
Platelets: 218 10*3/uL (ref 150–400)
RBC: 3.94 MIL/uL (ref 3.87–5.11)
RBC: 3.98 MIL/uL (ref 3.87–5.11)
RDW: 12.8 % (ref 11.5–15.5)
RDW: 13 % (ref 11.5–15.5)
WBC: 7 10*3/uL (ref 4.0–10.5)
WBC: 8.9 10*3/uL (ref 4.0–10.5)

## 2010-07-02 LAB — COMPREHENSIVE METABOLIC PANEL
ALT: 17 U/L (ref 0–35)
AST: 19 U/L (ref 0–37)
Albumin: 4.2 g/dL (ref 3.5–5.2)
Alkaline Phosphatase: 60 U/L (ref 39–117)
BUN: 6 mg/dL (ref 6–23)
CO2: 28 mEq/L (ref 19–32)
Calcium: 9.5 mg/dL (ref 8.4–10.5)
Chloride: 103 mEq/L (ref 96–112)
Creatinine, Ser: 0.6 mg/dL (ref 0.4–1.2)
GFR calc Af Amer: >60 mL/min (ref >60)
GFR calc non Af Amer: >60 mL/min (ref >60)
Glucose, Bld: 98 mg/dL (ref 70–99)
Potassium: 4.4 mEq/L (ref 3.5–5.1)
Sodium: 139 mEq/L (ref 135–145)
Total Bilirubin: 0.6 mg/dL (ref 0.3–1.2)
Total Protein: 8.1 g/dL (ref 6.0–8.3)

## 2010-07-04 ENCOUNTER — Ambulatory Visit (INDEPENDENT_AMBULATORY_CARE_PROVIDER_SITE_OTHER): Payer: BC Managed Care – PPO | Admitting: Obstetrics and Gynecology

## 2010-07-04 DIAGNOSIS — N949 Unspecified condition associated with female genital organs and menstrual cycle: Secondary | ICD-10-CM

## 2010-07-04 DIAGNOSIS — R350 Frequency of micturition: Secondary | ICD-10-CM

## 2010-07-09 ENCOUNTER — Ambulatory Visit (INDEPENDENT_AMBULATORY_CARE_PROVIDER_SITE_OTHER): Payer: BC Managed Care – PPO | Admitting: Obstetrics and Gynecology

## 2010-07-09 ENCOUNTER — Other Ambulatory Visit: Payer: BC Managed Care – PPO

## 2010-07-09 DIAGNOSIS — D259 Leiomyoma of uterus, unspecified: Secondary | ICD-10-CM

## 2010-07-09 DIAGNOSIS — R1032 Left lower quadrant pain: Secondary | ICD-10-CM

## 2010-07-09 DIAGNOSIS — N83209 Unspecified ovarian cyst, unspecified side: Secondary | ICD-10-CM

## 2010-07-09 DIAGNOSIS — N949 Unspecified condition associated with female genital organs and menstrual cycle: Secondary | ICD-10-CM

## 2010-07-30 NOTE — Assessment & Plan Note (Signed)
[facility]                         GASTROENTEROLOGY OFFICE NOTE   NAME:[name] [name]                     MRN:          [id number]  DATE:02/23/2007                            DOB:          05/21/1950    [name] [name] has been having some increased reflux symptoms, etiology of which  are unclear. She had been on increased doses of Protonix for the last  few days with some improvement. Her last endoscopic examination was  completed in April of 2003. She has had extensive workups otherwise  including colonoscopy in February of 2008. She initially had some  positive antigliadin antibodies, but anti tissue transglutaminase  antibody for celiac disease was negative. She really otherwise denies GI  complaints. She does complain of a lot of abdominal gas and bloating and  does use sorbitol in her diet which I have asked her to discontinue. She  also has a history of lactose intolerance.   She weighs 124 pounds. Her blood pressure is 122/62, pulse 66 and  regular.  I could not appreciate stigmata of chronic liver disease.  CHEST: Was clear.  CARDIAC: Examination was unremarkable.  I could not appreciate hepatosplenomegaly, abdominal masses or  tenderness. Bowel sounds were normal.   ASSESSMENT:  1. Worsening gastroesophageal reflux disease, reason for which are      unclear.  2. Abdominal gas and bloating probably secondary to carbohydrate      malabsorption.  3. History of Lyrica 100 mg twice a day for treatment of cervical      NECK: Pain and peripheral neuropathy.  4. History of osteoporosis on weekly actonel. She had recently gone to      monthly actonel which may have exacerbated her acid reflux.   RECOMMENDATIONS:  1. Avoidance of sorbitol and fructose along with lactose free diet.  2. Increase Protonix to 40 mg twice a day for 2-3 weeks.  3. Trial of align probiotic therapy.  4. Consider repeat endoscopy with small bowel biopsy dependent on her   clinic course.     [name]. Jarold Motto, MD, Caleen Essex, FAGA  Electronically Signed    DRP/MedQ  DD: 02/23/2007  DT: 02/23/2007  Job #: [id number]

## 2010-07-30 NOTE — Op Note (Signed)
NAME:  [name] [name], [name] [name]              ACCOUNT NO.:  [id number]   MEDICAL RECORD NO.:  [id number]          PATIENT TYPE:  OIB   LOCATION:  3526                         FACILITY:  [facility]   PHYSICIAN:  Hewitt Shorts, M.D.DATE OF BIRTH:  1951/02/22   DATE OF PROCEDURE:  05/04/2008  DATE OF DISCHARGE:                               OPERATIVE REPORT   PREOPERATIVE DIAGNOSIS:  C3-4 and C4-5 cervical degenerative disk  disease, spondylosis, and disk herniation.   POSTOPERATIVE DIAGNOSIS:  C3-4 and C4-5 cervical degenerative disk  disease, spondylosis, and disk herniation.   PROCEDURE:  C3-4 and C4-5 anterior cervical decompression and  arthrodesis with allograft and Tether cervical plating.   SURGEON:  Hewitt Shorts, M.D.   ASSISTANT:  Nelia Shi. Webb Silversmith, NP and Hilda Lias, M.D.   ANESTHESIA:  General endotracheal.   INDICATIONS:  The patient is a 60 year old woman who presented with neck  pain descending into the trapezius and shoulders bilaterally, at times  into the left pectoral region.  She has had previous diskectomy and  fusions at C5-6 and C6-7 and x-rays of the MRI scan show degenerative  changes with disk protrusions at C3-4 and C4-5.  A decision was made to  proceed with 2 level anterior cervical decompression and arthrodesis.   PROCEDURE IN DETAIL:  The patient was brought to the operating room and  placed under general endotracheal anesthesia.  The patient was placed in  10 pounds of Halter traction.  The neck was prepped with Betadine soap  and solution and draped in sterile fashion.  A horizontal incision was  made on the left side of the neck.  The line of the incision was  infiltrated with local anesthetic with epinephrine.  Dissection was  carried down through the subcutaneous tissue and platysma with bipolar  cautery.  Electrocautery was used to maintain hemostasis.  Dissection  was carried out through an avascular plane leaving the  sternocleidomastoid,  carotid artery, and jugular vein laterally and the  trachea and esophagus medially.  The ventral aspect of the vertebral  column was identified and localizing x-rays were taken.  The C3-4 and C4-  5 intervertebral disk space was identified.  Diskectomy was begun at the  each level with incision of the annulus and continued with microcurettes  and pituitary rongeurs.  Both disk spaces were remarkably narrowed.  We  removed the anterior aspect of overgrowth and proceeded with the  diskectomy.  The operating microscope was draped and brought into the  field to provide additional magnification, illumination, and  visualization.  The remainder of the decompression was performed using  microdissection and microsurgical technique.  Diskectomy was continued  with microcurettes and pituitary rongeurs and this cauterized, endplates  were removed using microcurettes and high-speed drill.  Posterior  osteophytic overgrowth was removed using the high-speed drill along with  a 2-mm Kerrison punch with the thin foot plate.  The posterior  longitudinal ligament was carefully removed along with the disk  herniation.  Good decompression was achieved in the spinal canal and  thecal sac and then the attention was turned to the neuroforamina  and  the spondylotic encroachment was removed as well using a 2-mm Kerrison  punch at each level bilaterally.  Once decompression was completed,  hemostasis was established with the use of Gelfoam soaked in thrombin.  We then measured the height of the intervertebral disk space and  selected two 6 mm intervertebral grafts.  Each of the grafts was  hydrated in saline solution and positioned in the intervertebral disk  space and countersunk.  We then selected a 29-mm Tether cervical plate.  It was positioned over the fusion construct and secured to the vertebra  with 4 x 13 mm variable angle screws placing a pair of screws at C3  another pair at C5, and a single screw at C4.   Once all 5 screws were in  place, final tightening was performed.  Then the wound was irrigated  with Bacitracin solution, checked for hemostasis which was established  and confirmed, and then an x-ray was taken which showed the plate,  graft, and screws all in good position.  The alignment was good.  Then  we proceeded with closure.  Incision was closed with interrupted  inverted 2-0 undyed Vicryl suture.  The subcutaneous and subcuticular  were closed with interrupted inverted 2-0 and 3-0 undyed Vicryl sutures.  The skin was approximated with Dermabond.  The procedure was tolerated  well.  The estimated blood loss was 100 mL.  Sponge and needle count  were correct.  Following surgery, the patient was to be placed in a soft  cervical collar  reversed from the anesthetic, extubated, and  transferred to the recovery room for further care, was noted to be  moving all 4 extremities to command.      Hewitt Shorts, M.D.  Electronically Signed     RWN/MEDQ  D:  05/04/2008  T:  05/05/2008  Job:  [id number]

## 2010-07-30 NOTE — Assessment & Plan Note (Signed)
[facility]                         GASTROENTEROLOGY OFFICE NOTE   NAME:[name], [name] PERI                     MRN:          [id number]  DATE:04/20/2007                            DOB:          Jan 14, 1951    [name] [name] is better after being treated empirically for bacterial overgrowth  syndrome with Xifaxan 400 mg t.i.d. for 10 days, along with Align  probiotic therapy.  She continued with the gas, bloating, and some early  satiety.  She has chronic GERD and has been taking Protonix 1-2 times a  day.  Other medications include Lyrica 100 mg twice a day and a variety  of multivitamin and minerals, Actonel 35 mg a week.  She has a history  of allergies to PENICILLIN, ERYTHROMYCIN, FLAGYL, and SULFA.   She continues to be concerned about malabsorption, and this certainly  is a possibility, although she has not had anorexia, weight loss, or  steatorrhea.  She has had chronic problems on review of her chart for  the last several years, without any definite diagnosis being determined.  Review of her chart as per my previous note says she had a weakly  positive sprue panel but a negative TTG.  Previous trials that have  treated her with pancreatic extracts were unsuccessful.   She weighs 129 pounds, which is actually up 10 pounds from 3 years ago.  Blood pressure 118/68, and pulse was 72 and regular.  General physical  exam is deferred.   ASSESSMENT:  1. Probable chronic irritable bowel syndrome.  2. Exclude gastrointestinal motility disturbance with bacterial      overgrowth secondary to this problem and chronic proton pump      inhibitor use.  3. Chronic acid reflux.   RECOMMENDATIONS:  1. Check technetium gastric emptying scan.  2. If scan is normal, will proceed with repeat endoscopy and upper      abdominal ultrasound exam.  3. Continue avoidance of nondigestible carbohydrates.  4. Decrease PPI therapy to once a day.  5. Check anemia profile, mainly  to see B12 level, to see if this is      low, which would support a diagnosis of recurrent bacterial      overgrowth syndrome.     [name]. Jarold Motto, MD, Caleen Essex, FAGA  Electronically Signed    DRP/MedQ  DD: 04/20/2007  DT: 04/21/2007  Job #: [id number]   cc:   Molly Maduro L. Foy [place], M.D.

## 2010-07-30 NOTE — Assessment & Plan Note (Signed)
[facility]                         GASTROENTEROLOGY OFFICE NOTE   NAME:Alejos, MORGANNA STYLES                     MRN:          [id number]  DATE:03/23/2007                            DOB:          [birth year]    HISTORY:  Natajah's reflux has been cured by taking twice a day Protonix.  She continued with the gas and bloating which was initially abolished  with Align, but just returned.  It gives her a lot of mid abdominal  discomfort.  Her appetite is good and her weight is stable.  She is  having fairly regular bowel movements.  She is up to date on her  endoscopy and colonoscopy.  The last colonoscopy in February 2008.   PHYSICAL EXAMINATION:  VITAL SIGNS:  All normal.  General physical  examination was not repeated.   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  She also has probable gluten sensitivity, although her Sprue  panel does not show a positive TTG.  Surprisingly, she says that wheat  products soothe my gut.  This makes celiac disease highly unlikely.   RECOMMENDATIONS:  1. Trial of Xifaxan 40 mg t.i.d. for 10 days along with a Align      probiotic therapy.  2. Adjust Protonix dose to 30 minutes before breakfast and twice a day      if needed.  3. Continue to avoid sorbitol and fructose.  4. GI followup in 1 months' time.     [name]. Jarold Motto, MD, Caleen Essex, FAGA  Electronically Signed    DRP/MedQ  DD: 03/23/2007  DT: 03/23/2007  Job #: [id number]

## 2010-10-29 ENCOUNTER — Encounter: Payer: Self-pay | Admitting: Gastroenterology

## 2010-10-29 ENCOUNTER — Ambulatory Visit (INDEPENDENT_AMBULATORY_CARE_PROVIDER_SITE_OTHER): Payer: BC Managed Care – PPO | Admitting: Gastroenterology

## 2010-10-29 VITALS — BP 128/64 | HR 60 | Ht 65.0 in | Wt 114.0 lb

## 2010-10-29 DIAGNOSIS — K589 Irritable bowel syndrome without diarrhea: Secondary | ICD-10-CM

## 2010-10-29 DIAGNOSIS — K6389 Other specified diseases of intestine: Secondary | ICD-10-CM

## 2010-10-29 MED ORDER — RIFAXIMIN 550 MG PO TABS: 550.0000 mg | ORAL_TABLET | Freq: Two times a day (BID) | ORAL | Status: DC

## 2010-10-29 NOTE — Patient Instructions (Signed)
Your prescription(s) have been sent to you pharmacy.  Hold Protonix until you come back to see Dr Jarold Motto in 2 weeks.

## 2010-10-29 NOTE — Progress Notes (Signed)
This is a 60 year old Caucasian female with chronic acid reflux and chronic IBS. She now presents with vague abdominal discomfort, gas and bloating which she attributes to one year of by mouth Cephalexin that she is taken for facial Rosecea. He has tried various probiotics, and actually tried by mouth Xifaxan in February minimal improvement. Recent abdominal ultrasound, pelvic ultrasound, and GYN exam by Dr. Eda Paschal apparently been normal. Patient has had previous surgery and radiation therapy for breast cancer. She also has had fusion surgery of C3-C7. She has been evaluated repeatedly for celiac disease, and apparently workups have been negative. She denies symptoms of malabsorption, anorexia, weight loss, melena, hematochezia hepatobiliary complaints, or current upper GI issues. She avoids sorbitol and fructose and eats healthfully.  Current Medications, Allergies, Past Medical History, Past Surgical History, Family History and Social History were reviewed in [employer] record.  Pertinent Review of Systems Negative... no current dermatologic problems. Review of systems otherwise negative.   Physical Exam: Attractive healthy appearing patient in no acute distress. I cannot appreciate any dermatologic lesions or stigmata of chronic liver disease. Chest clear cardiac exam shows no murmurs gallops or rubs with a normal rhythm. I cannot appreciate hepatosplenomegaly, abdominal masses or tenderness. Bowel sounds are normal.  Extremities are unremarkable mental status is normal.   Assessment and Plan: We'll recurrent bacterial overgrowth syndrome versus recurrent IBS. We will repeat her Xifaxan therapy for 2 weeks with probiotics, hold her Protonix therapy, and see her in the office in 2 weeks' time. She is to send [name] her labs for review. She probably needs H. pylori antibodies and anemia profile with B12 level if not done. No diagnosis found.

## 2010-10-31 ENCOUNTER — Telehealth: Payer: Self-pay | Admitting: Gastroenterology

## 2010-10-31 NOTE — Telephone Encounter (Signed)
Advised to continue all medication, except Protonix.

## 2010-11-12 ENCOUNTER — Ambulatory Visit (INDEPENDENT_AMBULATORY_CARE_PROVIDER_SITE_OTHER): Payer: BC Managed Care – PPO | Admitting: Gastroenterology

## 2010-11-12 ENCOUNTER — Encounter: Payer: Self-pay | Admitting: Gastroenterology

## 2010-11-12 VITALS — BP 116/70 | HR 76 | Ht 65.0 in | Wt 115.2 lb

## 2010-11-12 DIAGNOSIS — K589 Irritable bowel syndrome without diarrhea: Secondary | ICD-10-CM | POA: Insufficient documentation

## 2010-11-12 DIAGNOSIS — K219 Gastro-esophageal reflux disease without esophagitis: Secondary | ICD-10-CM | POA: Insufficient documentation

## 2010-11-12 DIAGNOSIS — K6389 Other specified diseases of intestine: Secondary | ICD-10-CM | POA: Insufficient documentation

## 2010-11-12 MED ORDER — PANTOPRAZOLE SODIUM 40 MG PO TBEC: 40.0000 mg | DELAYED_RELEASE_TABLET | Freq: Two times a day (BID) | ORAL | Status: DC

## 2010-11-12 NOTE — Progress Notes (Signed)
History of Present Illness: This is a 60 year old Caucasian female with chronic IBS who recently completed a two-week course of Xifaxan 550 mg twice a day along with probiotic therapy. She has had marked improvement in her gas and bloating and obvious symptomatology. At this time she held her Protonix medication, and is currently having recurrent mild acid reflux symptoms. Patient has a long history of multiple drug sensitivities and allergies. Currently her appetite is good and her weight is stable and she denies upper or lower bowel problems, melena, hematochezia, or any hepatobiliary difficulties. Review of her labs from her primary care physician are all normal.    Current Medications, Allergies, Past Medical History, Past Surgical History, Family History and Social History were reviewed in [employer] record.   Assessment and plan: Chronic IBS with probable element of bacterial overgrowth syndrome. She has she does symptomatically and decide if she needs periodic Xifaxan treatment. We will resume her Protonix, and again see how she tolerates this medication. I've given her information concerning dietary adjustments with IBS. She is to call in 2 weeks time for progress report. Encounter Diagnoses  Name Primary?  . IBS (irritable bowel syndrome) Yes  . Bacterial overgrowth syndrome   . GERD (gastroesophageal reflux disease)

## 2010-11-12 NOTE — Patient Instructions (Signed)
Your prescription(s) have been sent to you pharmacy.  We have made a copy of Diet modifications for patients for IBS.

## 2010-11-19 ENCOUNTER — Ambulatory Visit: Payer: BC Managed Care – PPO | Admitting: Gastroenterology

## 2010-11-19 ENCOUNTER — Other Ambulatory Visit: Payer: Self-pay | Admitting: Obstetrics and Gynecology

## 2010-11-22 ENCOUNTER — Telehealth: Payer: Self-pay

## 2010-11-22 MED ORDER — RISEDRONATE SODIUM 35 MG PO TBEC: 35.0000 mg | DELAYED_RELEASE_TABLET | ORAL | Status: DC

## 2010-11-22 NOTE — Telephone Encounter (Signed)
PT DOES NOT HAVE ANYMORE REFILLS ON HER ATELVIA, BECAUSE LAST PAP WAS MAY 2011. [facility] STATES AT THE April 2012 VISITS SHE HAD WITH YOU THAT YOU TOLD HER OK TO HAVE NEXT PAP 2013 WITH YOU. SEE PCP FOR PHYSICAL THIS YEAR. SHE STATES SHE ALSO SEES DR. TOTH FOR HER ANNUAL BREAST CHECK WITH HIM. IS THIS ALL CORRECT SO I CAN REFILL HER RX THRU 2013?

## 2010-11-22 NOTE — Telephone Encounter (Signed)
Does PCP do pelvic exam on her?

## 2010-11-22 NOTE — Telephone Encounter (Signed)
NO-PER DR. Janina Mayo PT OK TO GET AEX WITH PAP WITH HIM April 2013. I REFILLED HER RX THRU THEN. PT. NOTIFIED.

## 2010-12-05 ENCOUNTER — Ambulatory Visit
Admission: RE | Admit: 2010-12-05 | Discharge: 2010-12-05 | Disposition: A | Discharge status: Home / Self Care | Payer: BC Managed Care – PPO | Source: Ambulatory Visit | Attending: Family Medicine | Admitting: Family Medicine

## 2010-12-05 ENCOUNTER — Other Ambulatory Visit: Payer: Self-pay | Admitting: Family Medicine

## 2010-12-05 DIAGNOSIS — R42 Dizziness and giddiness: Secondary | ICD-10-CM

## 2010-12-05 DIAGNOSIS — S0990XA Unspecified injury of head, initial encounter: Secondary | ICD-10-CM

## 2010-12-05 DIAGNOSIS — R51 Headache: Secondary | ICD-10-CM

## 2011-03-20 ENCOUNTER — Other Ambulatory Visit (HOSPITAL_BASED_OUTPATIENT_CLINIC_OR_DEPARTMENT_OTHER): Payer: Self-pay | Admitting: Family Medicine

## 2011-03-20 DIAGNOSIS — F0781 Postconcussional syndrome: Secondary | ICD-10-CM

## 2011-03-22 ENCOUNTER — Ambulatory Visit (HOSPITAL_BASED_OUTPATIENT_CLINIC_OR_DEPARTMENT_OTHER)
Admission: RE | Admit: 2011-03-22 | Discharge: 2011-03-22 | Disposition: A | Discharge status: Home / Self Care | Payer: BC Managed Care – PPO | Source: Ambulatory Visit | Attending: Family Medicine | Admitting: Family Medicine

## 2011-03-22 DIAGNOSIS — F0781 Postconcussional syndrome: Secondary | ICD-10-CM

## 2011-03-22 DIAGNOSIS — IMO0002 Reserved for concepts with insufficient information to code with codable children: Secondary | ICD-10-CM

## 2011-03-22 DIAGNOSIS — R930 Abnormal findings on diagnostic imaging of skull and head, not elsewhere classified: Secondary | ICD-10-CM

## 2011-03-22 DIAGNOSIS — R51 Headache: Secondary | ICD-10-CM

## 2011-03-28 ENCOUNTER — Encounter: Payer: Self-pay | Admitting: Obstetrics and Gynecology

## 2011-03-31 ENCOUNTER — Encounter (INDEPENDENT_AMBULATORY_CARE_PROVIDER_SITE_OTHER): Payer: Self-pay | Admitting: General Surgery

## 2011-07-07 ENCOUNTER — Ambulatory Visit (INDEPENDENT_AMBULATORY_CARE_PROVIDER_SITE_OTHER): Payer: Self-pay | Admitting: General Surgery

## 2011-07-10 ENCOUNTER — Ambulatory Visit (INDEPENDENT_AMBULATORY_CARE_PROVIDER_SITE_OTHER): Payer: BC Managed Care – PPO | Admitting: General Surgery

## 2011-07-10 ENCOUNTER — Encounter (INDEPENDENT_AMBULATORY_CARE_PROVIDER_SITE_OTHER): Payer: Self-pay | Admitting: General Surgery

## 2011-07-10 VITALS — BP 122/73 | HR 73 | Temp 98.9°F | Ht 65.0 in | Wt 115.8 lb

## 2011-07-10 DIAGNOSIS — C50919 Malignant neoplasm of unspecified site of unspecified female breast: Secondary | ICD-10-CM

## 2011-07-10 DIAGNOSIS — Z86000 Personal history of in-situ neoplasm of breast: Secondary | ICD-10-CM | POA: Insufficient documentation

## 2011-07-10 NOTE — Patient Instructions (Signed)
Continue regular self exams  

## 2011-07-10 NOTE — Progress Notes (Signed)
Subjective:     Patient ID: [name] [name], female   DOB: [birth year], 61 y.o.   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 She received radiation and no of extreme it. She still has stable soreness in the right breast that is unchanged. She also has some soreness in the 6:00 position of the left breast which comes and goes. Otherwise she has had no major medical problems since her last visit.  Review of Systems  Constitutional: Negative.   HENT: Negative.   Eyes: Negative.   Respiratory: Negative.   Cardiovascular: Negative.   Gastrointestinal: Negative.   Genitourinary: Negative.   Musculoskeletal: Negative.   Skin: Negative.   Neurological: Negative.   Hematological: Negative.   Psychiatric/Behavioral: Negative.        Objective:   Physical Exam  Constitutional: She is oriented to person, place, and time. She appears well-developed and well-nourished.  HENT:  Head: Normocephalic and atraumatic.  Eyes: Conjunctivae and EOM are normal. Pupils are equal, round, and reactive to light.  Neck: Normal range of motion. Neck supple.  Cardiovascular: Normal rate, regular rhythm and normal heart sounds.   Pulmonary/Chest: Effort normal and breath sounds normal.       She has no palpable mass in either breast. No axillary supraclavicular or cervical lymphadenopathy is palpable.  Abdominal: Soft. Bowel sounds are normal. She exhibits no mass. There is no tenderness.  Musculoskeletal: Normal range of motion.  Lymphadenopathy:    She has no cervical adenopathy.  Neurological: She is alert and oriented to person, place, and time.  Skin: Skin is warm and dry.  Psychiatric: She has a normal mood and affect. Her behavior is normal.       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 We will plan to see her back  in another year.

## 2011-07-24 ENCOUNTER — Telehealth: Payer: Self-pay | Admitting: Gastroenterology

## 2011-07-24 NOTE — Telephone Encounter (Signed)
Prescribe Florstar twice a day for 2 weeks.

## 2011-07-24 NOTE — Telephone Encounter (Signed)
Pt reports Dr Jarold Motto will place her on Xifaxan when she has problems with her stomach. Pt had a sinus infection in March and was on an antibiotic. Ever since, she has gas, bloating and "discomfort" in her stomach. Please advise. Thanks.

## 2011-07-24 NOTE — Telephone Encounter (Signed)
Informed pt of the need for a Probiotic. Pt reports she's been on a probiotic for years. Please advise. Thanks.

## 2011-07-25 NOTE — Telephone Encounter (Signed)
This is been a problem for this patient for many years. She has not need more antibiotics. Referr to [facility], please arrange, I am not providing further antibiotics or phone consultations.

## 2011-07-28 ENCOUNTER — Telehealth: Payer: Self-pay | Admitting: *Deleted

## 2011-07-28 MED ORDER — SACCHAROMYCES BOULARDII 250 MG PO CAPS: 250.0000 mg | ORAL_CAPSULE | Freq: Two times a day (BID) | ORAL | Status: AC

## 2011-07-28 NOTE — Telephone Encounter (Signed)
Informed pt Dr Jarold Motto does not want to tx her with more antibiotics. Dr Jarold Motto would like to refer pt to [facility] for their recommendations. Pt stated understanding and will call when she reviews her schedule.

## 2011-07-28 NOTE — Telephone Encounter (Signed)
Pt reports she will not make an appt with [facility] at this time. She did request I order the probiotic recommended by Dr Jarold Motto; ordered Florastor.

## 2011-08-07 ENCOUNTER — Encounter: Payer: Self-pay | Admitting: Gynecology

## 2011-08-15 ENCOUNTER — Encounter: Payer: Self-pay | Admitting: Obstetrics and Gynecology

## 2011-08-15 ENCOUNTER — Ambulatory Visit (INDEPENDENT_AMBULATORY_CARE_PROVIDER_SITE_OTHER): Payer: BC Managed Care – PPO | Admitting: Obstetrics and Gynecology

## 2011-08-15 VITALS — BP 124/70 | Ht 65.0 in | Wt 114.0 lb

## 2011-08-15 DIAGNOSIS — M949 Disorder of cartilage, unspecified: Secondary | ICD-10-CM

## 2011-08-15 DIAGNOSIS — M8580 Other specified disorders of bone density and structure, unspecified site: Secondary | ICD-10-CM

## 2011-08-15 DIAGNOSIS — N949 Unspecified condition associated with female genital organs and menstrual cycle: Secondary | ICD-10-CM

## 2011-08-15 DIAGNOSIS — M899 Disorder of bone, unspecified: Secondary | ICD-10-CM

## 2011-08-15 DIAGNOSIS — R102 Pelvic and perineal pain: Secondary | ICD-10-CM

## 2011-08-15 DIAGNOSIS — Z01419 Encounter for gynecological examination (general) (routine) without abnormal findings: Secondary | ICD-10-CM

## 2011-08-15 DIAGNOSIS — N898 Other specified noninflammatory disorders of vagina: Secondary | ICD-10-CM

## 2011-08-15 DIAGNOSIS — I493 Ventricular premature depolarization: Secondary | ICD-10-CM | POA: Insufficient documentation

## 2011-08-15 DIAGNOSIS — I491 Atrial premature depolarization: Secondary | ICD-10-CM | POA: Insufficient documentation

## 2011-08-15 DIAGNOSIS — N899 Noninflammatory disorder of vagina, unspecified: Secondary | ICD-10-CM

## 2011-08-15 LAB — CBC WITH DIFFERENTIAL/PLATELET
Basophils Absolute: 0 10*3/uL (ref 0.0–0.1)
Basophils Relative: 0 % (ref 0–1)
Eosinophils Absolute: 0.1 10*3/uL (ref 0.0–0.7)
Eosinophils Relative: 1 % (ref 0–5)
HCT: 37.7 % (ref 36.0–46.0)
Hemoglobin: 12.5 g/dL (ref 12.0–15.0)
Lymphocytes Relative: 26 % (ref 12–46)
Lymphs Abs: 2 10*3/uL (ref 0.7–4.0)
MCH: 30 pg (ref 26.0–34.0)
MCHC: 33.2 g/dL (ref 30.0–36.0)
MCV: 90.4 fL (ref 78.0–100.0)
Monocytes Absolute: 0.6 10*3/uL (ref 0.1–1.0)
Monocytes Relative: 7 % (ref 3–12)
Neutro Abs: 5.1 10*3/uL (ref 1.7–7.7)
Neutrophils Relative %: 66 % (ref 43–77)
Platelets: 263 10*3/uL (ref 150–400)
RBC: 4.17 MIL/uL (ref 3.87–5.11)
RDW: 14.3 % (ref 11.5–15.5)
WBC: 7.7 10*3/uL (ref 4.0–10.5)

## 2011-08-15 LAB — WET PREP FOR TRICH, YEAST, CLUE
Clue Cells Wet Prep HPF POC: NONE SEEN
Trich, Wet Prep: NONE SEEN
Yeast Wet Prep HPF POC: NONE SEEN

## 2011-08-15 MED ORDER — NYSTATIN-TRIAMCINOLONE 100000-0.1 UNIT/GM-% EX OINT: TOPICAL_OINTMENT | Freq: Two times a day (BID) | CUTANEOUS | Status: DC

## 2011-08-15 NOTE — Progress Notes (Addendum)
Patient came to see me today for her annual GYN exam. She is menopausal and is doing well without HRT. She does have atrophic vaginitis but she is a breast cancer survivor and is not on vaginal estrogen. She does have dyspareunia but they have other sexual activity. She is up-to-date on mammograms. She is having no vaginal bleeding. She is having lower abdominal discomfort and bloating. She has had low bone mass and has been treated with biphosphonate's. From 2006-2009 she took Actonel. Prior to that she took 2 years of Fosamax in 1998-1999. For the past 2 years she has been on atelvia without side effects. She is due for followup bone density. She had her cholesterol checked by her PCP. She does get intermittent vulvar irritation especially when she wears tight clothes. Patient was treated for CIN with cryosurgery in 1980 and has had normal Paps since that.  Physical examination: Kennon Portela present HEENT within normal limits. Neck: Thyroid not large. No masses. Supraclavicular nodes: not enlarged. Breasts: Examined in both sitting and lying  position. No skin changes and no masses. Abdomen: Soft no guarding rebound or masses or hernia. Pelvic: External: Within normal limits. BUS: Within normal limits. Vaginal:within normal limits. Poor  estrogen effect. No evidence of cystocele rectocele or enterocele. Cervix: clean. Uterus: Normal size and shape. Adnexa: No masses. Rectovaginal exam: Confirmatory and negative. Extremities: Within normal limits. Wet prep negative.  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 Scheduled bone density here. Continue yearly mammograms. Schedule pelvic ultrasound. Mycolog  ointment for vulvar irritation. We also discussed testing for BRCA-1 and BRCA2. Patient had early onset DCIS and sister had ovarian cancer. Her risk for the gene is small. I suggested if she wanted to discuss whether she needs to  be tested to get genetic testing at [facility].

## 2011-08-16 LAB — URINALYSIS W MICROSCOPIC + REFLEX CULTURE
Bacteria, UA: NONE SEEN
Bilirubin Urine: NEGATIVE
Casts: NONE SEEN
Crystals: NONE SEEN
Glucose, UA: NEGATIVE mg/dL
Hgb urine dipstick: NEGATIVE
Ketones, ur: NEGATIVE mg/dL
Leukocytes, UA: NEGATIVE
Nitrite: NEGATIVE
Protein, ur: NEGATIVE mg/dL
Specific Gravity, Urine: 1.01 (ref 1.005–1.030)
Squamous Epithelial / LPF: NONE SEEN
Urobilinogen, UA: 0.2 mg/dL (ref 0.0–1.0)
pH: 7 (ref 5.0–8.0)

## 2011-09-03 ENCOUNTER — Ambulatory Visit (INDEPENDENT_AMBULATORY_CARE_PROVIDER_SITE_OTHER): Payer: BC Managed Care – PPO

## 2011-09-03 DIAGNOSIS — M899 Disorder of bone, unspecified: Secondary | ICD-10-CM

## 2011-09-03 DIAGNOSIS — M8580 Other specified disorders of bone density and structure, unspecified site: Secondary | ICD-10-CM

## 2011-09-03 DIAGNOSIS — M949 Disorder of cartilage, unspecified: Secondary | ICD-10-CM

## 2011-09-10 ENCOUNTER — Encounter: Payer: Self-pay | Admitting: Obstetrics and Gynecology

## 2011-09-11 ENCOUNTER — Ambulatory Visit (INDEPENDENT_AMBULATORY_CARE_PROVIDER_SITE_OTHER): Payer: BC Managed Care – PPO | Admitting: Obstetrics and Gynecology

## 2011-09-11 ENCOUNTER — Ambulatory Visit (INDEPENDENT_AMBULATORY_CARE_PROVIDER_SITE_OTHER): Payer: BC Managed Care – PPO

## 2011-09-11 DIAGNOSIS — M8580 Other specified disorders of bone density and structure, unspecified site: Secondary | ICD-10-CM

## 2011-09-11 DIAGNOSIS — R102 Pelvic and perineal pain: Secondary | ICD-10-CM

## 2011-09-11 DIAGNOSIS — IMO0002 Reserved for concepts with insufficient information to code with codable children: Secondary | ICD-10-CM

## 2011-09-11 DIAGNOSIS — N949 Unspecified condition associated with female genital organs and menstrual cycle: Secondary | ICD-10-CM

## 2011-09-11 DIAGNOSIS — N9089 Other specified noninflammatory disorders of vulva and perineum: Secondary | ICD-10-CM

## 2011-09-11 DIAGNOSIS — M949 Disorder of cartilage, unspecified: Secondary | ICD-10-CM

## 2011-09-11 DIAGNOSIS — M899 Disorder of bone, unspecified: Secondary | ICD-10-CM

## 2011-09-11 DIAGNOSIS — D259 Leiomyoma of uterus, unspecified: Secondary | ICD-10-CM

## 2011-09-11 MED ORDER — CLOBETASOL PROPIONATE 0.05 % EX CREA: TOPICAL_CREAM | Freq: Two times a day (BID) | CUTANEOUS | Status: DC

## 2011-09-11 NOTE — Progress Notes (Signed)
The patient came to see me today for an ultrasound with pelvic pain and dyspareunia. 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 This is smaller than when she last had an ultrasound. Her endometrial echo is thin at 1.3 mm. Her right ovary is atrophic. Her left ovary has a thin walled echo-free cyst of 1.2 cm. It is negative for color-flow Doppler. The patient was reassured. She told me that she has been using nystatin/triamcinolone cream I gave her at the end of May. She gets some relief from it but not complete. We switched her today to Temovate ointment to use twice a day. If it persists after this she will make an office visit. She also had a question about her bone density. She was surprised that I had stopped her biphosphonate since her bone density was only stable but not improved. I explained to her that that is considered a positive response and that there will always be some risk of fracture regardless of whether she takes her medication or not. I offered to let her stay on it but she is fine with the explanation and we will repeat her bone density in 2 years.

## 2011-10-08 ENCOUNTER — Encounter: Payer: Self-pay | Admitting: Gynecology

## 2011-10-08 ENCOUNTER — Ambulatory Visit (INDEPENDENT_AMBULATORY_CARE_PROVIDER_SITE_OTHER): Payer: BC Managed Care – PPO | Admitting: Gynecology

## 2011-10-08 DIAGNOSIS — N899 Noninflammatory disorder of vagina, unspecified: Secondary | ICD-10-CM

## 2011-10-08 DIAGNOSIS — N952 Postmenopausal atrophic vaginitis: Secondary | ICD-10-CM

## 2011-10-08 DIAGNOSIS — N898 Other specified noninflammatory disorders of vagina: Secondary | ICD-10-CM

## 2011-10-08 MED ORDER — FLUCONAZOLE 150 MG PO TABS: 150.0000 mg | ORAL_TABLET | Freq: Once | ORAL | Status: AC

## 2011-10-08 NOTE — Progress Notes (Signed)
Patient presents complaining of periurethral red areas with some yellowish skin changes and purpleish changes. Has been recently treating for vaginal irritation initially with Mytrex type cream and then subsequently with Temovate cream by Dr. Eda Paschal. She has a history of atrophic vaginal changes preventing intercourse. She denies any trauma to the area or manipulation. She has been applying the cream digitally.  Exam with Selena Batten assistant Pelvic: External BUS vagina with atrophic changes. Several small red hemorrhagic linear mucosal changes suburethral opening. Mucosa intact without evidence of infection, swelling, necrosis. Vagina palpates normal. Cervix normal. Uterus normal size midline mobile nontender. Adnexa without masses or tenderness.  Assessment and plan: Red linear areas suburethral probable due to trauma and mucosal fragility. No evidence of infection necrosis tumor or any other abnormality. Recommended patient stop applying cream at present. I would cover her with Diflucan 150 mg tablets one by mouth daily x5 days to treat any cutaneous yeast and she'll follow up in one month for reinspection.

## 2011-10-08 NOTE — Patient Instructions (Signed)
Take Diflucan pills daily for 5 days. Follow up in one month for reinspection. Sooner if any issues.

## 2011-10-14 ENCOUNTER — Ambulatory Visit: Payer: BC Managed Care – PPO | Admitting: Obstetrics and Gynecology

## 2011-10-15 ENCOUNTER — Ambulatory Visit: Payer: BC Managed Care – PPO | Admitting: Obstetrics and Gynecology

## 2011-11-05 ENCOUNTER — Ambulatory Visit: Payer: BC Managed Care – PPO | Admitting: Gynecology

## 2011-11-06 ENCOUNTER — Encounter: Payer: Self-pay | Admitting: Gynecology

## 2011-11-06 ENCOUNTER — Ambulatory Visit (INDEPENDENT_AMBULATORY_CARE_PROVIDER_SITE_OTHER): Payer: BC Managed Care – PPO | Admitting: Gynecology

## 2011-11-06 DIAGNOSIS — B3731 Acute candidiasis of vulva and vagina: Secondary | ICD-10-CM

## 2011-11-06 DIAGNOSIS — N952 Postmenopausal atrophic vaginitis: Secondary | ICD-10-CM

## 2011-11-06 DIAGNOSIS — B373 Candidiasis of vulva and vagina: Secondary | ICD-10-CM

## 2011-11-06 NOTE — Patient Instructions (Signed)
Use nystatin triamcinolone cream externally as needed. If external irritation becomes consisting call me otherwise follow up when you're due for your annual pelvic exam.

## 2011-11-06 NOTE — Progress Notes (Signed)
Patient follows up in reference to her 10/08/2011 visit. She is some linear suburethral streaking thought to be due to trauma but she was concerned about this on self exam. She also was treated with Diflucan for 5 days having failed Mytrex and Temovate cream. Patient notes that her irritative symptoms are much improved and on self exam she no longer see the street areas. Does have occasional external vulvar irritation and itching. Does not have dyspareunia or other hypoestrogenic type symptoms.  Exam with a mean assistant External BUS vagina with atrophic changes prior linear suburethral streaking areas are now gone. Bimanual without masses or tenderness  Assessment and plan: Slight trauma to be able to intercourse previously now totally resolved. Occasional irritation externally. Recommended Mytrex cream that she has at home when necessary. If her symptoms would become more consistent I will retreat with a longer course of Diflucan given her good response to the five-day course. We discussed atrophic vaginal treatments to include OTC moisturizers such as Replens. She does have a history of breast cancer and she does not want to entertain estrogen. Assuming she continues well then she'll follow up when she is due for her annual exam. Sooner as needed.

## 2012-01-13 ENCOUNTER — Encounter: Payer: Self-pay | Admitting: Gastroenterology

## 2012-01-13 ENCOUNTER — Ambulatory Visit (INDEPENDENT_AMBULATORY_CARE_PROVIDER_SITE_OTHER): Payer: BC Managed Care – PPO | Admitting: Gastroenterology

## 2012-01-13 VITALS — BP 118/62 | HR 64 | Ht 65.0 in | Wt 114.0 lb

## 2012-01-13 DIAGNOSIS — Z8719 Personal history of other diseases of the digestive system: Secondary | ICD-10-CM

## 2012-01-13 DIAGNOSIS — K589 Irritable bowel syndrome without diarrhea: Secondary | ICD-10-CM

## 2012-01-13 DIAGNOSIS — Z1211 Encounter for screening for malignant neoplasm of colon: Secondary | ICD-10-CM

## 2012-01-13 MED ORDER — RIFAXIMIN 550 MG PO TABS: 550.0000 mg | ORAL_TABLET | Freq: Two times a day (BID) | ORAL | Status: DC

## 2012-01-13 MED ORDER — PANTOPRAZOLE SODIUM 40 MG PO TBEC: 40.0000 mg | DELAYED_RELEASE_TABLET | Freq: Two times a day (BID) | ORAL | Status: DC

## 2012-01-13 NOTE — Progress Notes (Signed)
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 She is on regular probiotics and daily PPI therapy. She denies current change in her chronic GI complaints of gas and bloating. She specifically denies melena, hematochezia nausea vomiting, anorexia or weight loss. Previous treatment with Xifaxan had questionable benefits. This patient has a history of multiple drug allergies to almost all antibiotics.  Current Medications, Allergies, Past Medical History, Past Surgical History, Family History and Social History were reviewed in [employer] record.  Pertinent Review of Systems Negative   Physical Exam: Blood pressure 118/62, pulse 64 and regular, and weight 114 pounds with BMI of 18.97. Healthy-appearing patient in no distress. Abdominal exam entirely normal without distention, organomegaly, masses or tenderness.    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 She may have an element of bacterial overgrowth syndrome, and we have given her prescription for when necessary Xifaxan 5 mg twice a day for 10 days as needed. Also have renewed her Protonix. At her request we will do IFOB  Stool cards depending on her insurance coverage. She has no symptoms of refractory acid reflux, and no family history of colon polyps or colon carcinoma.  Encounter Diagnosis  Name Primary?  . Special screening for malignant neoplasms, colon Yes

## 2012-01-13 NOTE — Patient Instructions (Addendum)
Your physician has requested that you go to the basement for the following lab work before leaving today: Ifob.  We have sent the following medications to your pharmacy for you to pick up at your convenience:pantoprazole.  You have been handed a prescription for Xifaxan for you take as needed.

## 2012-04-02 ENCOUNTER — Encounter: Payer: Self-pay | Admitting: Gynecology

## 2012-05-13 ENCOUNTER — Other Ambulatory Visit (HOSPITAL_BASED_OUTPATIENT_CLINIC_OR_DEPARTMENT_OTHER): Payer: Self-pay | Admitting: Family Medicine

## 2012-05-13 DIAGNOSIS — R109 Unspecified abdominal pain: Secondary | ICD-10-CM

## 2012-05-14 ENCOUNTER — Ambulatory Visit (HOSPITAL_BASED_OUTPATIENT_CLINIC_OR_DEPARTMENT_OTHER): Payer: BC Managed Care – PPO

## 2012-05-20 ENCOUNTER — Ambulatory Visit (HOSPITAL_BASED_OUTPATIENT_CLINIC_OR_DEPARTMENT_OTHER)
Admission: RE | Admit: 2012-05-20 | Discharge: 2012-05-20 | Disposition: A | Discharge status: Home / Self Care | Payer: BC Managed Care – PPO | Source: Ambulatory Visit | Attending: Family Medicine | Admitting: Family Medicine

## 2012-05-20 ENCOUNTER — Other Ambulatory Visit (HOSPITAL_BASED_OUTPATIENT_CLINIC_OR_DEPARTMENT_OTHER): Payer: BC Managed Care – PPO

## 2012-05-20 ENCOUNTER — Encounter (HOSPITAL_BASED_OUTPATIENT_CLINIC_OR_DEPARTMENT_OTHER): Payer: Self-pay

## 2012-05-20 DIAGNOSIS — N949 Unspecified condition associated with female genital organs and menstrual cycle: Secondary | ICD-10-CM | POA: Insufficient documentation

## 2012-05-20 DIAGNOSIS — R109 Unspecified abdominal pain: Secondary | ICD-10-CM

## 2012-05-20 HISTORY — DX: Left bundle-branch block, unspecified: I44.7

## 2012-05-20 MED ORDER — IOHEXOL 300 MG/ML  SOLN
100.0000 mL | Freq: Once | INTRAMUSCULAR | Status: AC | PRN
  Administered 2012-05-20: 100 mL via INTRAVENOUS

## 2012-05-26 ENCOUNTER — Encounter (INDEPENDENT_AMBULATORY_CARE_PROVIDER_SITE_OTHER): Payer: Self-pay | Admitting: General Surgery

## 2012-10-13 ENCOUNTER — Encounter (INDEPENDENT_AMBULATORY_CARE_PROVIDER_SITE_OTHER): Payer: Self-pay

## 2012-10-13 ENCOUNTER — Ambulatory Visit (INDEPENDENT_AMBULATORY_CARE_PROVIDER_SITE_OTHER): Payer: BC Managed Care – PPO | Admitting: General Surgery

## 2012-10-13 ENCOUNTER — Encounter (INDEPENDENT_AMBULATORY_CARE_PROVIDER_SITE_OTHER): Payer: Self-pay | Admitting: General Surgery

## 2012-10-13 VITALS — BP 124/72 | HR 64 | Resp 12 | Ht 65.0 in | Wt 112.0 lb

## 2012-10-13 DIAGNOSIS — C50911 Malignant neoplasm of unspecified site of right female breast: Secondary | ICD-10-CM

## 2012-10-13 DIAGNOSIS — C50919 Malignant neoplasm of unspecified site of unspecified female breast: Secondary | ICD-10-CM

## 2012-10-13 NOTE — Progress Notes (Signed)
Subjective:     Patient ID: [name] [name], female   DOB: Oct 05, 1950, 62 y.o.   MRN: [id number]  HPI The patient is a 62 year old white female who is 15 years status post right lumpectomy for DCIS. She continues to have the same soreness in the right breast that she has had previously. She denies any discharge from her nipple. Her only other complaint is of back pain which began in October. She is followed for this by her neurosurgeon.  Review of Systems  Constitutional: Negative.   HENT: Negative.   Eyes: Negative.   Respiratory: Negative.   Cardiovascular: Negative.   Gastrointestinal: Negative.   Endocrine: Negative.   Genitourinary: Negative.   Musculoskeletal: Positive for back pain and arthralgias.  Skin: Negative.   Allergic/Immunologic: Negative.   Neurological: Negative.   Hematological: Negative.   Psychiatric/Behavioral: Negative.        Objective:   Physical Exam  Constitutional: She is oriented to person, place, and time. She appears well-developed and well-nourished.  HENT:  Head: Normocephalic and atraumatic.  Eyes: Conjunctivae and EOM are normal. Pupils are equal, round, and reactive to light.  Neck: Normal range of motion. Neck supple.  Cardiovascular: Normal rate, regular rhythm and normal heart sounds.   Pulmonary/Chest: Effort normal and breath sounds normal.  There is no palpable mass in either breast. Her right lumpectomy incision is healing nicely. There is a small soft mobile lymph node palpable in the left axilla. There is no palpable adenopathy in the right axilla, supraclavicular or cervical area  Abdominal: Soft. Bowel sounds are normal. She exhibits no mass. There is no tenderness.  Musculoskeletal: Normal range of motion.  Lymphadenopathy:    She has no cervical adenopathy.  Neurological: She is alert and oriented to person, place, and time.  Skin: Skin is warm and dry.  Psychiatric: She has a normal mood and affect. Her behavior is normal.        Assessment:     The patient is 62 years status post right lumpectomy for DCIS     Plan:     At this point she will continue to do regular self exams. We will plan to see her back in one year. Her most recent mammogram in January showed no evidence of malignancy

## 2012-10-13 NOTE — Patient Instructions (Signed)
Continue regular self exams  

## 2012-10-19 DIAGNOSIS — M546 Pain in thoracic spine: Secondary | ICD-10-CM | POA: Insufficient documentation

## 2012-10-19 DIAGNOSIS — M545 Low back pain, unspecified: Secondary | ICD-10-CM | POA: Insufficient documentation

## 2012-10-19 DIAGNOSIS — M5414 Radiculopathy, thoracic region: Secondary | ICD-10-CM | POA: Insufficient documentation

## 2012-11-19 ENCOUNTER — Other Ambulatory Visit: Payer: Self-pay | Admitting: Gastroenterology

## 2012-11-19 DIAGNOSIS — R109 Unspecified abdominal pain: Secondary | ICD-10-CM

## 2012-11-23 ENCOUNTER — Ambulatory Visit (HOSPITAL_BASED_OUTPATIENT_CLINIC_OR_DEPARTMENT_OTHER): Payer: BC Managed Care – PPO

## 2012-11-23 ENCOUNTER — Other Ambulatory Visit: Payer: BC Managed Care – PPO

## 2012-11-23 ENCOUNTER — Ambulatory Visit (HOSPITAL_BASED_OUTPATIENT_CLINIC_OR_DEPARTMENT_OTHER)
Admission: RE | Admit: 2012-11-23 | Discharge: 2012-11-23 | Disposition: A | Discharge status: Home / Self Care | Payer: BC Managed Care – PPO | Source: Ambulatory Visit | Attending: Gastroenterology | Admitting: Gastroenterology

## 2012-11-23 ENCOUNTER — Other Ambulatory Visit (HOSPITAL_BASED_OUTPATIENT_CLINIC_OR_DEPARTMENT_OTHER): Payer: Self-pay | Admitting: Gastroenterology

## 2012-11-23 DIAGNOSIS — I700 Atherosclerosis of aorta: Secondary | ICD-10-CM | POA: Insufficient documentation

## 2012-11-23 DIAGNOSIS — Z923 Personal history of irradiation: Secondary | ICD-10-CM | POA: Insufficient documentation

## 2012-11-23 DIAGNOSIS — D7389 Other diseases of spleen: Secondary | ICD-10-CM | POA: Insufficient documentation

## 2012-11-23 DIAGNOSIS — M25559 Pain in unspecified hip: Secondary | ICD-10-CM | POA: Insufficient documentation

## 2012-11-23 DIAGNOSIS — R109 Unspecified abdominal pain: Secondary | ICD-10-CM

## 2012-11-23 DIAGNOSIS — Z853 Personal history of malignant neoplasm of breast: Secondary | ICD-10-CM | POA: Insufficient documentation

## 2012-11-23 MED ORDER — IOHEXOL 300 MG/ML  SOLN
100.0000 mL | Freq: Once | INTRAMUSCULAR | Status: AC | PRN
  Administered 2012-11-23: 100 mL via INTRAVENOUS

## 2013-01-03 ENCOUNTER — Ambulatory Visit (INDEPENDENT_AMBULATORY_CARE_PROVIDER_SITE_OTHER): Payer: BC Managed Care – PPO | Admitting: Gynecology

## 2013-01-03 ENCOUNTER — Encounter: Payer: Self-pay | Admitting: Gynecology

## 2013-01-03 ENCOUNTER — Other Ambulatory Visit (HOSPITAL_COMMUNITY)
Admission: RE | Admit: 2013-01-03 | Discharge: 2013-01-03 | Disposition: A | Discharge status: Home / Self Care | Payer: BC Managed Care – PPO | Source: Ambulatory Visit | Attending: Gynecology | Admitting: Gynecology

## 2013-01-03 VITALS — BP 120/70 | Ht 65.0 in | Wt 111.0 lb

## 2013-01-03 DIAGNOSIS — N952 Postmenopausal atrophic vaginitis: Secondary | ICD-10-CM

## 2013-01-03 DIAGNOSIS — R109 Unspecified abdominal pain: Secondary | ICD-10-CM

## 2013-01-03 DIAGNOSIS — N879 Dysplasia of cervix uteri, unspecified: Secondary | ICD-10-CM

## 2013-01-03 DIAGNOSIS — M8580 Other specified disorders of bone density and structure, unspecified site: Secondary | ICD-10-CM

## 2013-01-03 DIAGNOSIS — Z01419 Encounter for gynecological examination (general) (routine) without abnormal findings: Secondary | ICD-10-CM | POA: Insufficient documentation

## 2013-01-03 DIAGNOSIS — M899 Disorder of bone, unspecified: Secondary | ICD-10-CM

## 2013-01-03 DIAGNOSIS — Z1151 Encounter for screening for human papillomavirus (HPV): Secondary | ICD-10-CM | POA: Insufficient documentation

## 2013-01-03 NOTE — Addendum Note (Signed)
Addended by: Dayna Barker on: 01/03/2013 03:44 PM   Modules accepted: Orders

## 2013-01-03 NOTE — Patient Instructions (Signed)
Follow up in one year for annual exam 

## 2013-01-03 NOTE — Progress Notes (Signed)
[name] [name] 1950-04-01 [id number]        62 y.o.  G0P0 for annual followup exam.  Former patient of Dr. Eda Paschal. Several issues noted below.  Past medical history,surgical history, medications, allergies, family history and social history were all reviewed and documented in the EPIC chart.  ROS:  Performed and pertinent positives and negatives are included in the history, assessment and plan .  Exam: Kim assistant There were no vitals filed for this visit. General appearance  Normal Skin grossly normal Head/Neck normal with no cervical or supraclavicular adenopathy thyroid normal Lungs  clear Cardiac RR, without RMG Abdominal  soft, nontender, without masses, organomegaly or hernia Breasts  examined lying and sitting without masses, retractions, discharge or axillary adenopathy. Right breast status post lumpectomy changes. No acute changes. Pelvic  Ext/BUS/vagina  significant atrophic changes  Cervix  flush with the upper vagina. Os stenotic. Pap/HPV  Uterus  anteverted, normal size, shape and contour, midline and mobile nontender   Adnexa  Without masses or tenderness    Anus and perineum  normal   Rectovaginal  normal sphincter tone without palpated masses or tenderness.    Assessment/Plan:  62 y.o. G0P0 female for annual exam.   1. Postmenopausal/atrophic genital changes.  History of breast cancer and does not want to entertain estrogen either systemically or vaginally. Is tolerating her atrophic genital changes.  2. Osteopenia. DEXA 08/2011 T score -1.9.  Per Dr. Verl Dicker history from 2006-2009 she took Actonel. Prior to that she took 2 years of Fosamax in 1998-1999. For the past 2 years she has been on [place] which was discontinued last year and she is one year into a drug-free holiday. Increase calcium vitamin D reviewed. Plan repeat DEXA next year at 2 year interval 3. Breast cancer. Continues to see her oncologist with routine followup and NED. Mammogram 03/2012.  Continue with annual mammography. SBE monthly reviewed. Sister with ovarian cancer. Dr. Eda Paschal had discussed and offered BRCA testing given her history of breast cancer also. Patient has not had done and again reoffered the option to talk to a genetic counselor at the oncology center which she declines. 4. Pap smear 2011. Pap/HPV today. History of cryosurgery 1980 without significant abnormalities since then. Her cervix is very atrophic and I would not be surprised if it is inadequate on Pap smear and HPV also with this. 5. Colonoscopy. Scheduled for next week along with endoscopy. Being worked up for abdominal pain. Has had numerous testing to include CT scans x2 this past year. Will followup with her gastroenterologist. She did ask about missing GYN disease. I reviewed with her with a negative CT x2 unlikely. Could have miliary disease that is unseen on ultrasound or CT scans but unlikely. The only other testing would be laparoscopy which she is not interested in pursuing.  6. Health maintenance. No blood work done today as it is all done through her primary physician's office who she sees on on a regular basis. Followup one year, sooner as needed.     Note: This document was prepared with digital dictation and possible smart phrase technology. Any transcriptional errors that result from this process are unintentional.   Dara Lords MD, 2:34 PM 01/03/2013

## 2013-01-04 NOTE — Progress Notes (Signed)
Error encounter. 

## 2013-01-10 ENCOUNTER — Other Ambulatory Visit: Payer: Self-pay | Admitting: Dermatology

## 2013-04-28 ENCOUNTER — Ambulatory Visit: Payer: BC Managed Care – PPO | Admitting: Cardiology

## 2013-05-05 ENCOUNTER — Encounter: Payer: Self-pay | Admitting: Gynecology

## 2013-05-18 ENCOUNTER — Encounter: Payer: Self-pay | Admitting: General Surgery

## 2013-05-18 ENCOUNTER — Encounter (INDEPENDENT_AMBULATORY_CARE_PROVIDER_SITE_OTHER): Payer: Self-pay

## 2013-05-18 ENCOUNTER — Ambulatory Visit (INDEPENDENT_AMBULATORY_CARE_PROVIDER_SITE_OTHER): Payer: BC Managed Care – PPO | Admitting: Cardiology

## 2013-05-18 ENCOUNTER — Encounter: Payer: Self-pay | Admitting: Cardiology

## 2013-05-18 VITALS — BP 128/67 | HR 96 | Ht 65.0 in | Wt 112.8 lb

## 2013-05-18 DIAGNOSIS — I4719 Other supraventricular tachycardia: Secondary | ICD-10-CM

## 2013-05-18 DIAGNOSIS — I493 Ventricular premature depolarization: Secondary | ICD-10-CM

## 2013-05-18 DIAGNOSIS — I447 Left bundle-branch block, unspecified: Secondary | ICD-10-CM

## 2013-05-18 DIAGNOSIS — I471 Supraventricular tachycardia, unspecified: Secondary | ICD-10-CM

## 2013-05-18 DIAGNOSIS — I491 Atrial premature depolarization: Secondary | ICD-10-CM

## 2013-05-18 DIAGNOSIS — I059 Rheumatic mitral valve disease, unspecified: Secondary | ICD-10-CM

## 2013-05-18 DIAGNOSIS — I4949 Other premature depolarization: Secondary | ICD-10-CM

## 2013-05-18 NOTE — Patient Instructions (Signed)
Your physician recommends that you continue on your current medications as directed. Please refer to the Current Medication list given to you today.  Your physician wants you to follow-up in: 1 year with Dr. Turner. You will receive a reminder letter in the mail two months in advance. If you don't receive a letter, please call our office to schedule the follow-up appointment.  

## 2013-05-18 NOTE — Progress Notes (Signed)
[facility], [place], [place]  [address] Phone: [phone] Fax:  [phone]  Date:  05/18/2013   ID:  [name] [name], DOB 06/17/1950, MRN [id number]  PCP:  Abigail Miyamoto, MD  Cardiologist:  Fransico Him, MD     History of Present Illness: [name] [name] is a 63 y.o. female with a history of PAT, PAC's/PVC's, MVP and LBBB who presents today for followup.  She is doing well.  She denies any chest pain, SOB, DOE, LE edema, dizziness or syncope.  She occasionally has some palpitation but her PAC's and PVC's have been very well controlled.   Wt Readings from Last 3 Encounters:  05/18/13 112 lb 12.8 oz (51.166 kg)  01/03/13 111 lb (50.349 kg)  10/13/12 112 lb (50.803 kg)     Past Medical History  Diagnosis Date  . Mitral valve disorders     No antibiotics required  . Cervicalgia   . Esophageal reflux   . Irritable bowel syndrome   . Stricture and stenosis of esophagus   . Hiatal hernia   . Heart murmur   . Osteopenia 08/2011    T score -1.9  . PVC (premature ventricular contraction)   . PAC (premature atrial contraction) 2012    ,PVC's, and nonsustained atril tachycardia w aberration by heart monitor   . Cervical dysplasia 1980  . Left bundle branch block   . LBBB (left bundle branch block)   . 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 Eczema     Rosacea,dermatitis-Dr [employer]  . Allergic rhinitis   . GERD (gastroesophageal reflux disease)   . 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), unspecified site     DCIS  . Glaucoma, narrow-angle     Current Outpatient Prescriptions  Medication Sig Dispense Refill  . Azelaic Acid (FINACEA EX) Apply topically daily.        . Calcium Carbonate-Vitamin D (CALTRATE 600+D) 600-400 MG-UNIT per tablet Take 1 tablet by mouth 2 (two) times daily.        . cromolyn (NASALCROM) 5.2 MG/ACT nasal spray Place 1 spray into the nose as needed.       Marland Kitchen Dextromethorphan-Guaifenesin (MUCINEX DM PO) Take by  mouth as needed.       . gabapentin (NEURONTIN) 300 MG capsule Take 300 mg by mouth daily.       . Sulfacetamide Sodium 10 % CREA Apply topically at bedtime.        . tacrolimus (PROTOPIC) 0.1 % ointment Apply topically at bedtime.        . vitamin C (ASCORBIC ACID) 500 MG tablet Take 500 mg by mouth as needed.       No current facility-administered medications for this visit.    Allergies:    Allergies  Allergen Reactions  . Avelox [Moxifloxacin Hcl In Nacl]   . Doxycycline   . Erythromycin   . Metronidazole   . Minocycline   . Oxybutynin Chloride     Ditropan   . Penicillins   . Quinolones   . Sulfonamide Derivatives   . Thimerosal   . Tramadol     Social History:  The patient  reports that she has never smoked. She has never used smokeless tobacco. She reports that she drinks alcohol. She reports that she does not use illicit drugs.   Family History:  The patient's family history includes Alcohol abuse in her sister; Aneurysm in her father; Cancer in  her maternal grandfather; Depression in her mother and sister; Heart disease in her father, mother, paternal grandfather, and sister; Hypertension in her father, mother, and sister; Melanoma in her sister and sister; Ovarian cancer in her sister; Stroke in her maternal grandmother and paternal grandmother; Uterine cancer in her sister. There is no history of Colon cancer.   ROS:  Please see the history of present illness.       All other systems reviewed and negative.   PHYSICAL EXAM: VS:  BP 128/67  Pulse 96  Ht 5\' 5"  (1.651 m)  Wt 112 lb 12.8 oz (51.166 kg)  BMI 18.77 kg/m2 Well nourished, well developed, in no acute distress HEENT: normal Neck: no JVD Cardiac:  normal S1, S2; RRR; no murmur Lungs:  clear to auscultation bilaterally, no wheezing, rhonchi or rales Abd: soft, nontender, no hepatomegaly Ext: no edema Skin: warm and dry Neuro:  CNs 2-12 intact, no focal abnormalities noted  EKG:  NSR with LBBB      ASSESSMENT AND PLAN:  1. Chronic LBBB 2. PAC's and PVC's well tolerated 3. MVP  Followup with me in 1 year  Signed, Fransico Him, MD 05/18/2013 1:37 PM

## 2013-06-26 ENCOUNTER — Encounter: Payer: Self-pay | Admitting: *Deleted

## 2013-09-19 ENCOUNTER — Encounter (INDEPENDENT_AMBULATORY_CARE_PROVIDER_SITE_OTHER): Payer: Self-pay | Admitting: General Surgery

## 2013-11-03 DIAGNOSIS — M542 Cervicalgia: Secondary | ICD-10-CM | POA: Diagnosis not present

## 2013-11-03 DIAGNOSIS — Z23 Encounter for immunization: Secondary | ICD-10-CM | POA: Diagnosis not present

## 2013-11-03 DIAGNOSIS — M25559 Pain in unspecified hip: Secondary | ICD-10-CM | POA: Diagnosis not present

## 2013-11-29 DIAGNOSIS — H2510 Age-related nuclear cataract, unspecified eye: Secondary | ICD-10-CM | POA: Diagnosis not present

## 2013-11-29 DIAGNOSIS — H402290 Chronic angle-closure glaucoma, unspecified eye, stage unspecified: Secondary | ICD-10-CM | POA: Diagnosis not present

## 2013-12-12 ENCOUNTER — Ambulatory Visit (INDEPENDENT_AMBULATORY_CARE_PROVIDER_SITE_OTHER): Payer: Medicare Other | Admitting: General Surgery

## 2013-12-20 DIAGNOSIS — Z23 Encounter for immunization: Secondary | ICD-10-CM | POA: Diagnosis not present

## 2013-12-20 DIAGNOSIS — Z13220 Encounter for screening for lipoid disorders: Secondary | ICD-10-CM | POA: Diagnosis not present

## 2013-12-20 DIAGNOSIS — R109 Unspecified abdominal pain: Secondary | ICD-10-CM | POA: Diagnosis not present

## 2013-12-20 DIAGNOSIS — Z0000 Encounter for general adult medical examination without abnormal findings: Secondary | ICD-10-CM | POA: Diagnosis not present

## 2013-12-20 DIAGNOSIS — Z136 Encounter for screening for cardiovascular disorders: Secondary | ICD-10-CM | POA: Diagnosis not present

## 2014-01-23 DIAGNOSIS — M79671 Pain in right foot: Secondary | ICD-10-CM | POA: Diagnosis not present

## 2014-03-14 ENCOUNTER — Encounter: Payer: Self-pay | Admitting: Cardiology

## 2014-04-03 DIAGNOSIS — J020 Streptococcal pharyngitis: Secondary | ICD-10-CM | POA: Diagnosis not present

## 2014-04-13 DIAGNOSIS — R0981 Nasal congestion: Secondary | ICD-10-CM | POA: Diagnosis not present

## 2014-04-13 DIAGNOSIS — M542 Cervicalgia: Secondary | ICD-10-CM | POA: Diagnosis not present

## 2014-05-30 DIAGNOSIS — H40033 Anatomical narrow angle, bilateral: Secondary | ICD-10-CM | POA: Diagnosis not present

## 2014-05-30 DIAGNOSIS — H0289 Other specified disorders of eyelid: Secondary | ICD-10-CM | POA: Diagnosis not present

## 2014-06-13 DIAGNOSIS — Z1231 Encounter for screening mammogram for malignant neoplasm of breast: Secondary | ICD-10-CM | POA: Diagnosis not present

## 2014-06-13 DIAGNOSIS — Z853 Personal history of malignant neoplasm of breast: Secondary | ICD-10-CM | POA: Diagnosis not present

## 2014-06-14 ENCOUNTER — Encounter: Payer: Self-pay | Admitting: Gynecology

## 2014-06-21 ENCOUNTER — Ambulatory Visit: Payer: Self-pay | Admitting: Cardiology

## 2014-07-11 ENCOUNTER — Encounter: Payer: Self-pay | Admitting: Cardiology

## 2014-07-11 ENCOUNTER — Ambulatory Visit (INDEPENDENT_AMBULATORY_CARE_PROVIDER_SITE_OTHER): Payer: Medicare Other | Admitting: Cardiology

## 2014-07-11 VITALS — BP 140/70 | HR 75 | Ht 65.0 in | Wt 114.0 lb

## 2014-07-11 DIAGNOSIS — I471 Supraventricular tachycardia: Secondary | ICD-10-CM

## 2014-07-11 DIAGNOSIS — I493 Ventricular premature depolarization: Secondary | ICD-10-CM | POA: Diagnosis not present

## 2014-07-11 DIAGNOSIS — I491 Atrial premature depolarization: Secondary | ICD-10-CM | POA: Diagnosis not present

## 2014-07-11 DIAGNOSIS — I447 Left bundle-branch block, unspecified: Secondary | ICD-10-CM

## 2014-07-11 DIAGNOSIS — I059 Rheumatic mitral valve disease, unspecified: Secondary | ICD-10-CM

## 2014-07-11 DIAGNOSIS — R0602 Shortness of breath: Secondary | ICD-10-CM

## 2014-07-11 NOTE — Progress Notes (Signed)
Cardiology Office Note   Date:  07/11/2014   ID:  [name] [name], DOB 10-22-1950, MRN [id number]  PCP:  Abigail Miyamoto, MD    Chief Complaint  Patient presents with  . Irregular Heart Beat  . Shortness of Breath      History of Present Illness: [name] [name] is a 64 y.o. female with a history of PAT, PAC's/PVC's, MVP and LBBB who presents today for followup. She is doing well. She denies any chest pain,  LE edema, dizziness or syncope. She occasionally has some palpitation but her PAC's and PVC's have been very well controlled.  She has chronic allergies but has noticed that she has been having some DOE when going upstairs over the past year and has also been more tired than usual.  She says that she will get an odd feeling in her chest when exerting herself.  She does have some problems with GERD as well and uses PPI PRN.      Past Medical History  Diagnosis Date  . Mitral valve disorders     No antibiotics required  . Cervicalgia   . Esophageal reflux   . Irritable bowel syndrome   . Stricture and stenosis of esophagus   . Hiatal hernia   . Heart murmur   . Osteopenia 08/2011    T score -1.9  . PVC (premature ventricular contraction)   . PAC (premature atrial contraction) 2012    ,PVC's, and nonsustained atril tachycardia w aberration by heart monitor   . Cervical dysplasia 1980  . Left bundle branch block   . LBBB (left bundle branch block)   . 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 Eczema     Rosacea,dermatitis-Dr [employer]  . Allergic rhinitis   . GERD (gastroesophageal reflux disease)   . 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), unspecified site     DCIS  . Glaucoma, narrow-angle     Past Surgical History  Procedure Laterality Date  . Neck surgery  [phone]    bone graft and fusion  . Rhinoplasty  1976  . Tubal ligation  1977  . Breast lumpectomy  1998    right breast with radiation  . Refractive surgery      narrow  angle glucoma  . Cervical discectomy      C3-5 with bone graft  . Gynecologic cryosurgery  1980     Current Outpatient Prescriptions  Medication Sig Dispense Refill  . Azelaic Acid (FINACEA EX) Apply topically daily.      . Calcium Carbonate-Vitamin D (CALTRATE 600+D) 600-400 MG-UNIT per tablet Take 1 tablet by mouth 2 (two) times daily.      . cromolyn (NASALCROM) 5.2 MG/ACT nasal spray Place 1 spray into the nose as needed.     Marland Kitchen Dextromethorphan-Guaifenesin (MUCINEX DM PO) Take by mouth as needed.     . gabapentin (NEURONTIN) 300 MG capsule Take 300 mg by mouth daily.     . Magnesium 250 MG TABS Take 1 tablet by mouth daily.    . Sulfacetamide Sodium 10 % CREA Apply topically at bedtime.      . tacrolimus (PROTOPIC) 0.1 % ointment Apply topically at bedtime.      . vitamin C (ASCORBIC ACID) 500 MG tablet Take 500 mg by mouth as needed.     No current facility-administered medications for this visit.    Allergies:   Avelox; Doxycycline; Erythromycin; Metronidazole; Minocycline; Oxybutynin chloride; Penicillins;  Quinolones; Sulfonamide derivatives; Thimerosal; and Tramadol    Social History:  The patient  reports that she has never smoked. She has never used smokeless tobacco. She reports that she drinks alcohol. She reports that she does not use illicit drugs.   Family History:  The patient's family history includes Alcohol abuse in her sister; Aneurysm in her father; Cancer in her maternal grandfather; Depression in her mother and sister; Heart disease in her father, mother, paternal grandfather, and sister; Hypertension in her father, mother, and sister; Melanoma in her sister and sister; Ovarian cancer in her sister; Stroke in her maternal grandmother and paternal grandmother; Uterine cancer in her sister. There is no history of Colon cancer.    ROS:  Please see the history of present illness.   Otherwise, review of systems are positive for none.   All other systems are reviewed  and negative.    PHYSICAL EXAM: VS:  BP 140/70 mmHg  Pulse 75  Ht 5\' 5"  (1.651 m)  Wt 114 lb (51.71 kg)  BMI 18.97 kg/m2 , BMI Body mass index is 18.97 kg/(m^2). GEN: Well nourished, well developed, in no acute distress HEENT: normal Neck: no JVD, carotid bruits, or masses Cardiac: RRR; no murmurs, rubs, or gallops,no edema  Respiratory:  clear to auscultation bilaterally, normal work of breathing GI: soft, nontender, nondistended, + BS MS: no deformity or atrophy Skin: warm and dry, no rash Neuro:  Strength and sensation are intact Psych: euthymic mood, full affect   EKG:  EKG  ordered today showed NSR with LBBB and LAE.    Recent Labs: No results found for requested labs within last 365 days.    Lipid Panel No results found for: CHOL, TRIG, HDL, CHOLHDL, VLDL, LDLCALC, LDLDIRECT    Wt Readings from Last 3 Encounters:  07/11/14 114 lb (51.71 kg)  05/18/13 112 lb 12.8 oz (51.166 kg)  01/03/13 111 lb (50.349 kg)    ASSESSMENT AND PLAN:  1. Chronic LBBB 2. PAC's and PVC's well tolerated 3. MVP 4. SOB ? Related to allergies but need to rule out cardiac etiology.  I will get a 2D echo to assess LVF and stress myoview to assess for ischemia.   5. Paroxysmal atrial tachcyardia   Current medicines are reviewed at length with the patient today.  The patient does not have concerns regarding medicines.  The following changes have been made:  no change  Labs/ tests ordered today include: see above assessment and plan  Orders Placed This Encounter  Procedures  . Myocardial Perfusion Imaging  . EKG 12-Lead  . 2D Echocardiogram with contrast     Disposition:   FU with me in 1 year   Signed, Sueanne Margarita, MD  07/11/2014 1:59 PM    [facility] [place], [place], [place]  [address] Phone: [phone]; Fax: [phone]

## 2014-07-11 NOTE — Patient Instructions (Signed)
Medication Instructions:  Your physician recommends that you continue on your current medications as directed. Please refer to the Current Medication list given to you today.   Labwork: None  Testing/Procedures: Your physician has requested that you have NUCLEAR STRESS TEST.  Your physician has requested that you have an echocardiogram. Echocardiography is a painless test that uses sound waves to create images of your heart. It provides your doctor with information about the size and shape of your heart and how well your heart's chambers and valves are working. This procedure takes approximately one hour. There are no restrictions for this procedure.  Follow-Up: Your physician wants you to follow-up in: 1 year with Dr. Radford Pax. You will receive a reminder letter in the mail two months in advance. If you don't receive a letter, please call our office to schedule the follow-up appointment.

## 2014-07-12 ENCOUNTER — Telehealth: Payer: Self-pay | Admitting: Cardiology

## 2014-07-12 NOTE — Telephone Encounter (Signed)
Spoke with pt and she states that she had spoken with [facility] yesterday in regards to instructions for her stress test. Pt states that she is going to attempt the Exercise Myoview but may have to do Lexiscan if unable to exercise. Pt wanted to know if the instructions would be any different if she went from one test to the other. Pt verbalized instructions given to her by [facility] and I informed pt that those were the correct instructions whether she had the exercise or the lexiscan, Pt verbalized understanding and was appreciative for the call.

## 2014-07-12 NOTE — Telephone Encounter (Signed)
New message         Pt has a question about her upcoming test

## 2014-07-13 ENCOUNTER — Telehealth: Payer: Self-pay | Admitting: Cardiology

## 2014-07-13 NOTE — Telephone Encounter (Signed)
New problem   Pt has questions about some medications she is taking and will it affect her stress test.  Please call home first and then [phone]

## 2014-07-13 NOTE — Telephone Encounter (Signed)
Reviewed medication list with patient again and verified with her that her medications are OK to take and will not affect her stress test.

## 2014-07-19 ENCOUNTER — Telehealth: Payer: Self-pay | Admitting: Cardiology

## 2014-07-19 NOTE — Telephone Encounter (Signed)
Patient is calling requesting prior authorization for her upcoming echo and nuclear test. I advised her these tests do not require precertification.   She asked me if I could get it approved through medicare even though it does not require so she will know she won't get a bill. I advised her that we cannot, as medicare covers by medical necessity. I quoted her an estimate of charges and explained that her blue cross supplement will pick up what medicare doesn't cover. She stated that she called medicare and they told her that her providers office can call and get an approval. I have been on the phone since 12:15 pm and it is now 3:20.  I have been transferred 5 times and every department states that CPT [phone] and CPT [phone] are covered and approved codes and that payment is based on medical necessity when the claim is received.   I called her back to tell her this information and explained that Dr. Radford Pax would only be requesting the tests if they were medically necessary.  She is requesting a call from our billing manager in hopes that she can not be charged anything if medicare was to deny payment. Can you please help me with this?  She is requesting a call at her work # in the morning.  She is available at that # until 59.  She can be reached at her home # after 2 pm. Her tests are scheduled the 10th.  She states she will cancel them tomorrow if she does not receive a phone call.

## 2014-07-24 ENCOUNTER — Telehealth (HOSPITAL_COMMUNITY): Payer: Self-pay

## 2014-07-24 NOTE — Telephone Encounter (Signed)
Patient given detailed instructions per Myocardial Perfusion Study Information Sheet for test on 07-25-2014 at 12:30pm. Patient verbalized understanding. [name] [name], [name] A

## 2014-07-25 ENCOUNTER — Other Ambulatory Visit: Payer: Self-pay

## 2014-07-25 ENCOUNTER — Ambulatory Visit (HOSPITAL_BASED_OUTPATIENT_CLINIC_OR_DEPARTMENT_OTHER): Payer: Medicare Other

## 2014-07-25 ENCOUNTER — Ambulatory Visit (HOSPITAL_COMMUNITY): Payer: Medicare Other | Attending: Cardiology

## 2014-07-25 DIAGNOSIS — I493 Ventricular premature depolarization: Secondary | ICD-10-CM | POA: Diagnosis not present

## 2014-07-25 DIAGNOSIS — I059 Rheumatic mitral valve disease, unspecified: Secondary | ICD-10-CM

## 2014-07-25 DIAGNOSIS — I341 Nonrheumatic mitral (valve) prolapse: Secondary | ICD-10-CM | POA: Insufficient documentation

## 2014-07-25 DIAGNOSIS — R0602 Shortness of breath: Secondary | ICD-10-CM | POA: Diagnosis not present

## 2014-07-25 LAB — MYOCARDIAL PERFUSION IMAGING
Estimated workload: 1 METS
LV dias vol: 75 mL
LV sys vol: 29 mL
Nuc Stress EF: 61 %
Peak HR: 117 {beats}/min
Percent of predicted max HR: 75 %
RATE: 0.33
Rest HR: 77 {beats}/min
SDS: 2
SRS: 4
SSS: 6
Stage 1 DBP: 68 mmHg
Stage 1 Grade: 0 %
Stage 1 HR: 79 {beats}/min
Stage 1 SBP: 130 mmHg
Stage 1 Speed: 0 mph
Stage 2 Grade: 0 %
Stage 2 HR: 80 {beats}/min
Stage 2 Speed: 0 mph
Stage 3 DBP: 53 mmHg
Stage 3 Grade: 0 %
Stage 3 HR: 116 {beats}/min
Stage 3 SBP: 145 mmHg
Stage 3 Speed: 0 mph
Stage 4 Grade: 0 %
Stage 4 HR: 117 {beats}/min
Stage 4 Speed: 0 mph
Stage 5 DBP: 57 mmHg
Stage 5 Grade: 0 %
Stage 5 HR: 104 {beats}/min
Stage 5 SBP: 135 mmHg
Stage 5 Speed: 0 mph
Stage 6 DBP: 61 mmHg
Stage 6 Grade: 0 %
Stage 6 HR: 96 {beats}/min
Stage 6 SBP: 128 mmHg
Stage 6 Speed: 0 mph
TID: 1.02

## 2014-07-25 MED ORDER — TECHNETIUM TC 99M SESTAMIBI GENERIC - CARDIOLITE
33.0000 | Freq: Once | INTRAVENOUS | Status: AC | PRN
  Administered 2014-07-25: 33 via INTRAVENOUS

## 2014-07-25 MED ORDER — REGADENOSON 0.4 MG/5ML IV SOLN
0.4000 mg | Freq: Once | INTRAVENOUS | Status: AC
  Administered 2014-07-25: 0.4 mg via INTRAVENOUS

## 2014-07-25 MED ORDER — TECHNETIUM TC 99M SESTAMIBI GENERIC - CARDIOLITE
11.0000 | Freq: Once | INTRAVENOUS | Status: AC | PRN
  Administered 2014-07-25: 11 via INTRAVENOUS

## 2014-07-26 ENCOUNTER — Telehealth: Payer: Self-pay | Admitting: Cardiology

## 2014-07-26 NOTE — Telephone Encounter (Signed)
ERROR

## 2014-08-15 ENCOUNTER — Encounter: Payer: Self-pay | Admitting: Gynecology

## 2014-08-22 ENCOUNTER — Ambulatory Visit (INDEPENDENT_AMBULATORY_CARE_PROVIDER_SITE_OTHER): Payer: Medicare Other | Admitting: Gynecology

## 2014-08-22 ENCOUNTER — Encounter: Payer: Self-pay | Admitting: Gynecology

## 2014-08-22 VITALS — BP 120/74 | Ht 65.0 in | Wt 113.0 lb

## 2014-08-22 DIAGNOSIS — Z01419 Encounter for gynecological examination (general) (routine) without abnormal findings: Secondary | ICD-10-CM | POA: Diagnosis not present

## 2014-08-22 DIAGNOSIS — M8580 Other specified disorders of bone density and structure, unspecified site: Secondary | ICD-10-CM

## 2014-08-22 NOTE — Progress Notes (Signed)
[name] [name] 1950-07-31 [id number]        64 y.o.  G0P0 for breast and pelvic exam. Several issues noted below.  Past medical history,surgical history, problem list, medications, allergies, family history and social history were all reviewed and documented as reviewed in the EPIC chart.  ROS:  Performed with pertinent positives and negatives included in the history, assessment and plan.   Additional significant findings :  none   Exam: Kim Counsellor Vitals:   08/22/14 1332  BP: 120/74  Height: 5\' 5"  (1.651 m)  Weight: 113 lb (51.256 kg)   General appearance:  Normal affect, orientation and appearance. Skin: Grossly normal HEENT: Without gross lesions.  No cervical or supraclavicular adenopathy. Thyroid normal.  Lungs:  Clear without wheezing, rales or rhonchi Cardiac: RR, without RMG Abdominal:  Soft, nontender, without masses, guarding, rebound, organomegaly or hernia Breasts:  Examined lying and sitting without masses, retractions, discharge or axillary adenopathy.  Well-healed right lumpectomy scar. Pelvic:  Ext/BUS/vagina with atrophic changes  Cervix with atrophic changes  Uterus anteverted, normal size, shape and contour, midline and mobile nontender   Adnexa  Without masses or tenderness    Anus and perineum  Normal   Rectovaginal  Normal sphincter tone without palpated masses or tenderness.    Assessment/Plan:  64 y.o. G0P0 female for breast and pelvic exam.   1. Osteopenia. DEXA 08/2011 T score -1.9. 1998 through 1999 took Fosamax.  2006 through 2009 she took Actonel. Subsequently was on [place] for 2 years and has been off of medication now for 2 years. Repeat DEXA now the patient will schedule. Increased calcium vitamin D reviewed. 2. Postmenopausal/atrophic genital changes. Without significant hot flushes, night sweats, vaginal dryness. No vaginal bleeding. Continue to monitor and report any vaginal bleeding. 3. Right sided breast cancer. Exam NED.   Mammography 05/2014. SBE monthly reviewed. Continue with annual mammography. Follow up with oncology as recommended. 4. Pap smear/HPV negative 12/2012. No Pap smear done today. History of cryosurgery in 1980 with normal Pap smears since then. Plan repeat Pap smear in 3-5 year interval current screening guidelines. 5. Colonoscopy 2008. Repeat at their recommended interval. 6. Health maintenance. No routine blood work done as this is done at her primary physician's office. Follow up for bone density otherwise annually.     Anastasio Auerbach MD, 2:39 PM 08/22/2014

## 2014-08-22 NOTE — Patient Instructions (Signed)
Follow up for bone density as scheduled.  You may obtain a copy of any labs that were done today by logging onto MyChart as outlined in the instructions provided with your AVS (after visit summary). The office will not call with normal lab results but certainly if there are any significant abnormalities then we will contact you.   Health Maintenance, Female A healthy lifestyle and preventative care can promote health and wellness.  Maintain regular health, dental, and eye exams.  Eat a healthy diet. Foods like vegetables, fruits, whole grains, low-fat dairy products, and lean protein foods contain the nutrients you need without too many calories. Decrease your intake of foods high in solid fats, added sugars, and salt. Get information about a proper diet from your caregiver, if necessary.  Regular physical exercise is one of the most important things you can do for your health. Most adults should get at least 150 minutes of moderate-intensity exercise (any activity that increases your heart rate and causes you to sweat) each week. In addition, most adults need muscle-strengthening exercises on 2 or more days a week.   Maintain a healthy weight. The body mass index (BMI) is a screening tool to identify possible weight problems. It provides an estimate of body fat based on height and weight. Your caregiver can help determine your BMI, and can help you achieve or maintain a healthy weight. For adults 20 years and older:  A BMI below 18.5 is considered underweight.  A BMI of 18.5 to 24.9 is normal.  A BMI of 25 to 29.9 is considered overweight.  A BMI of 30 and above is considered obese.  Maintain normal blood lipids and cholesterol by exercising and minimizing your intake of saturated fat. Eat a balanced diet with plenty of fruits and vegetables. Blood tests for lipids and cholesterol should begin at age 20 and be repeated every 5 years. If your lipid or cholesterol levels are high, you are over  50, or you are a high risk for heart disease, you may need your cholesterol levels checked more frequently.Ongoing high lipid and cholesterol levels should be treated with medicines if diet and exercise are not effective.  If you smoke, find out from your caregiver how to quit. If you do not use tobacco, do not start.  Lung cancer screening is recommended for adults aged 55 80 years who are at high risk for developing lung cancer because of a history of smoking. Yearly low-dose computed tomography (CT) is recommended for people who have at least a 30-pack-year history of smoking and are a current smoker or have quit within the past 15 years. A pack year of smoking is smoking an average of 1 pack of cigarettes a day for 1 year (for example: 1 pack a day for 30 years or 2 packs a day for 15 years). Yearly screening should continue until the smoker has stopped smoking for at least 15 years. Yearly screening should also be stopped for people who develop a health problem that would prevent them from having lung cancer treatment.  If you are pregnant, do not drink alcohol. If you are breastfeeding, be very cautious about drinking alcohol. If you are not pregnant and choose to drink alcohol, do not exceed 1 drink per day. One drink is considered to be 12 ounces (355 mL) of beer, 5 ounces (148 mL) of wine, or 1.5 ounces (44 mL) of liquor.  Avoid use of street drugs. Do not share needles with anyone. Ask for help   if you need support or instructions about stopping the use of drugs.  High blood pressure causes heart disease and increases the risk of stroke. Blood pressure should be checked at least every 1 to 2 years. Ongoing high blood pressure should be treated with medicines, if weight loss and exercise are not effective.  If you are 55 to 66 years old, ask your caregiver if you should take aspirin to prevent strokes.  Diabetes screening involves taking a blood sample to check your fasting blood sugar level.  This should be done once every 3 years, after age 45, if you are within normal weight and without risk factors for diabetes. Testing should be considered at a younger age or be carried out more frequently if you are overweight and have at least 1 risk factor for diabetes.  Breast cancer screening is essential preventative care for women. You should practice "breast self-awareness." This means understanding the normal appearance and feel of your breasts and may include breast self-examination. Any changes detected, no matter how small, should be reported to a caregiver. Women in their 20s and 30s should have a clinical breast exam (CBE) by a caregiver as part of a regular health exam every 1 to 3 years. After age 40, women should have a CBE every year. Starting at age 40, women should consider having a mammogram (breast X-ray) every year. Women who have a family history of breast cancer should talk to their caregiver about genetic screening. Women at a high risk of breast cancer should talk to their caregiver about having an MRI and a mammogram every year.  Breast cancer gene (BRCA)-related cancer risk assessment is recommended for women who have family members with BRCA-related cancers. BRCA-related cancers include breast, ovarian, tubal, and peritoneal cancers. Having family members with these cancers may be associated with an increased risk for harmful changes (mutations) in the breast cancer genes BRCA1 and BRCA2. Results of the assessment will determine the need for genetic counseling and BRCA1 and BRCA2 testing.  The Pap test is a screening test for cervical cancer. Women should have a Pap test starting at age 21. Between ages 21 and 29, Pap tests should be repeated every 2 years. Beginning at age 30, you should have a Pap test every 3 years as long as the past 3 Pap tests have been normal. If you had a hysterectomy for a problem that was not cancer or a condition that could lead to cancer, then you no  longer need Pap tests. If you are between ages 65 and 70, and you have had normal Pap tests going back 10 years, you no longer need Pap tests. If you have had past treatment for cervical cancer or a condition that could lead to cancer, you need Pap tests and screening for cancer for at least 20 years after your treatment. If Pap tests have been discontinued, risk factors (such as a new sexual partner) need to be reassessed to determine if screening should be resumed. Some women have medical problems that increase the chance of getting cervical cancer. In these cases, your caregiver may recommend more frequent screening and Pap tests.  The human papillomavirus (HPV) test is an additional test that may be used for cervical cancer screening. The HPV test looks for the virus that can cause the cell changes on the cervix. The cells collected during the Pap test can be tested for HPV. The HPV test could be used to screen women aged 30 years and older, and   should be used in women of any age who have unclear Pap test results. After the age of 30, women should have HPV testing at the same frequency as a Pap test.  Colorectal cancer can be detected and often prevented. Most routine colorectal cancer screening begins at the age of 50 and continues through age 75. However, your caregiver may recommend screening at an earlier age if you have risk factors for colon cancer. On a yearly basis, your caregiver may provide home test kits to check for hidden blood in the stool. Use of a small camera at the end of a tube, to directly examine the colon (sigmoidoscopy or colonoscopy), can detect the earliest forms of colorectal cancer. Talk to your caregiver about this at age 50, when routine screening begins. Direct examination of the colon should be repeated every 5 to 10 years through age 75, unless early forms of pre-cancerous polyps or small growths are found.  Hepatitis C blood testing is recommended for all people born from  1945 through 1965 and any individual with known risks for hepatitis C.  Practice safe sex. Use condoms and avoid high-risk sexual practices to reduce the spread of sexually transmitted infections (STIs). Sexually active women aged 25 and younger should be checked for Chlamydia, which is a common sexually transmitted infection. Older women with new or multiple partners should also be tested for Chlamydia. Testing for other STIs is recommended if you are sexually active and at increased risk.  Osteoporosis is a disease in which the bones lose minerals and strength with aging. This can result in serious bone fractures. The risk of osteoporosis can be identified using a bone density scan. Women ages 65 and over and women at risk for fractures or osteoporosis should discuss screening with their caregivers. Ask your caregiver whether you should be taking a calcium supplement or vitamin D to reduce the rate of osteoporosis.  Menopause can be associated with physical symptoms and risks. Hormone replacement therapy is available to decrease symptoms and risks. You should talk to your caregiver about whether hormone replacement therapy is right for you.  Use sunscreen. Apply sunscreen liberally and repeatedly throughout the day. You should seek shade when your shadow is shorter than you. Protect yourself by wearing long sleeves, pants, a wide-brimmed hat, and sunglasses year round, whenever you are outdoors.  Notify your caregiver of new moles or changes in moles, especially if there is a change in shape or color. Also notify your caregiver if a mole is larger than the size of a pencil eraser.  Stay current with your immunizations. Document Released: 09/16/2010 Document Revised: 06/28/2012 Document Reviewed: 09/16/2010 Exit[employer] Patient Information 2014 [employer].   

## 2014-08-22 NOTE — Addendum Note (Signed)
Addended by: Joaquin Music on: 08/22/2014 03:57 PM   Modules accepted: Orders

## 2014-08-23 ENCOUNTER — Encounter: Payer: Self-pay | Admitting: Gynecology

## 2014-09-06 DIAGNOSIS — D2239 Melanocytic nevi of other parts of face: Secondary | ICD-10-CM | POA: Diagnosis not present

## 2014-09-06 DIAGNOSIS — L814 Other melanin hyperpigmentation: Secondary | ICD-10-CM | POA: Diagnosis not present

## 2014-09-06 DIAGNOSIS — L718 Other rosacea: Secondary | ICD-10-CM | POA: Diagnosis not present

## 2014-09-06 DIAGNOSIS — D1801 Hemangioma of skin and subcutaneous tissue: Secondary | ICD-10-CM | POA: Diagnosis not present

## 2014-09-06 DIAGNOSIS — D2271 Melanocytic nevi of right lower limb, including hip: Secondary | ICD-10-CM | POA: Diagnosis not present

## 2014-09-06 DIAGNOSIS — D225 Melanocytic nevi of trunk: Secondary | ICD-10-CM | POA: Diagnosis not present

## 2014-09-06 DIAGNOSIS — L821 Other seborrheic keratosis: Secondary | ICD-10-CM | POA: Diagnosis not present

## 2014-09-11 ENCOUNTER — Other Ambulatory Visit: Payer: Self-pay

## 2014-09-15 DIAGNOSIS — M810 Age-related osteoporosis without current pathological fracture: Secondary | ICD-10-CM

## 2014-09-15 HISTORY — DX: Age-related osteoporosis without current pathological fracture: M81.0

## 2014-10-09 ENCOUNTER — Other Ambulatory Visit: Payer: Self-pay | Admitting: Gynecology

## 2014-10-09 ENCOUNTER — Ambulatory Visit (INDEPENDENT_AMBULATORY_CARE_PROVIDER_SITE_OTHER): Payer: Medicare Other

## 2014-10-09 DIAGNOSIS — M8580 Other specified disorders of bone density and structure, unspecified site: Secondary | ICD-10-CM

## 2014-10-09 DIAGNOSIS — M810 Age-related osteoporosis without current pathological fracture: Secondary | ICD-10-CM

## 2014-10-10 ENCOUNTER — Encounter: Payer: Self-pay | Admitting: Gynecology

## 2014-10-10 ENCOUNTER — Telehealth: Payer: Self-pay | Admitting: Gynecology

## 2014-10-10 DIAGNOSIS — M810 Age-related osteoporosis without current pathological fracture: Secondary | ICD-10-CM

## 2014-10-10 NOTE — Telephone Encounter (Signed)
Pt informed with the below note, orders placed for labs, pt would like to check with medicare to see if they are going to pay. She will call to schedule lab and consult.

## 2014-10-10 NOTE — Telephone Encounter (Signed)
Tell patient that her most recent bone density does show osteoporosis with an 8% to 90% loss of bone at her spine. Also a 4-5% loss at her left hip. I would recommend considering treatment with medication now. Given her past history of medication use I would suggest Prolia. I do think given her younger age and osteoporosis I would recommend checking a baseline lab studies to include vitamin D,TSH, PTH, comprehensive metabolic panel and 56-DJSH urine collection for calcium excretion.  Office visit after this lab work for discussion.

## 2014-10-25 ENCOUNTER — Telehealth: Payer: Self-pay | Admitting: *Deleted

## 2014-10-25 NOTE — Telephone Encounter (Signed)
Pt was told to come back to have blood done for Osteoporosis, she had a CMET done at PCP office in Oct. 2016 asked if you could use this one? Please advise

## 2014-10-25 NOTE — Telephone Encounter (Signed)
If she had a normal comprehensive metabolic panel that is okay but she still needs the vitamin D TSH PTH and 24-hour urine collection for calcium excretion

## 2014-10-25 NOTE — Telephone Encounter (Signed)
Pt informed states was normal will have result faxed to office to review.

## 2014-11-01 ENCOUNTER — Telehealth: Payer: Self-pay

## 2014-11-01 NOTE — Telephone Encounter (Signed)
Patient said she faxed her metabolic panel results last week to [facility] and wanted to be sure received. Anderson Malta did not recall receiving them and I do not see if scanned in to chart yet. I asked her to re-fax them to my attn.   Patient will drop them by tomorrow and will make them to my attention. Refer to Jennifer's last note and let patient know.

## 2014-11-02 ENCOUNTER — Telehealth: Payer: Self-pay

## 2014-11-02 NOTE — Telephone Encounter (Signed)
Patient informed. 

## 2014-11-02 NOTE — Telephone Encounter (Addendum)
Patient called back. I informed her I received her CMET and all normal. Per Dr. Loetta Rough note 8.10.16 no need to repeat but will need to come for other labs as ordered.  She asked does she need to discontinue her Calcium supplement prior to 24 hour urine for calcium excretion?  (She takes 600 mg Calcium tab w 800 units of D3 in it. She tries to take 2 daily but sometimes just one. )

## 2014-11-02 NOTE — Telephone Encounter (Signed)
No, she should continue her calcium as normally she does.

## 2014-11-02 NOTE — Telephone Encounter (Signed)
Per Jennifer's note from 10/25/14 patient dropped her CMET from 12/20/13 by today.  It does appear that it was all normal. She was advised we will not have to repeat it per Dr. Loetta Rough 10/25/14 note but will need to get the other labs drawn.

## 2014-11-30 DIAGNOSIS — H40033 Anatomical narrow angle, bilateral: Secondary | ICD-10-CM | POA: Diagnosis not present

## 2014-12-04 ENCOUNTER — Other Ambulatory Visit: Payer: Medicare Other

## 2014-12-11 ENCOUNTER — Other Ambulatory Visit: Payer: Medicare Other

## 2014-12-11 DIAGNOSIS — M810 Age-related osteoporosis without current pathological fracture: Secondary | ICD-10-CM

## 2014-12-12 LAB — CALCIUM, URINE, 24 HOUR
Calcium, 24 hour urine: 196 mg/d (ref 100–250)
Calcium, Ur: 14 mg/dL

## 2014-12-12 LAB — VITAMIN D 25 HYDROXY (VIT D DEFICIENCY, FRACTURES): Vit D, 25-Hydroxy: 41 ng/mL (ref 30–100)

## 2014-12-12 LAB — PTH, INTACT AND CALCIUM
Calcium: 10.3 mg/dL (ref 8.4–10.5)
PTH: 29 pg/mL (ref 14–64)

## 2014-12-12 LAB — TSH: TSH: 2.243 u[IU]/mL (ref 0.350–4.500)

## 2015-01-02 DIAGNOSIS — Z23 Encounter for immunization: Secondary | ICD-10-CM | POA: Diagnosis not present

## 2015-01-08 DIAGNOSIS — M542 Cervicalgia: Secondary | ICD-10-CM | POA: Diagnosis not present

## 2015-01-08 DIAGNOSIS — M79672 Pain in left foot: Secondary | ICD-10-CM | POA: Diagnosis not present

## 2015-01-08 DIAGNOSIS — M810 Age-related osteoporosis without current pathological fracture: Secondary | ICD-10-CM | POA: Diagnosis not present

## 2015-01-08 DIAGNOSIS — Z981 Arthrodesis status: Secondary | ICD-10-CM | POA: Diagnosis not present

## 2015-01-17 ENCOUNTER — Ambulatory Visit: Payer: Medicare Other | Admitting: Gynecology

## 2015-01-22 ENCOUNTER — Ambulatory Visit (INDEPENDENT_AMBULATORY_CARE_PROVIDER_SITE_OTHER): Payer: Medicare Other | Admitting: Gynecology

## 2015-01-22 ENCOUNTER — Encounter: Payer: Self-pay | Admitting: Gynecology

## 2015-01-22 VITALS — BP 120/70

## 2015-01-22 DIAGNOSIS — M810 Age-related osteoporosis without current pathological fracture: Secondary | ICD-10-CM

## 2015-01-22 MED ORDER — RISEDRONATE SODIUM 35 MG PO TBEC: 1.0000 | DELAYED_RELEASE_TABLET | ORAL | Status: DC

## 2015-01-22 NOTE — Progress Notes (Signed)
[name] [name] 1950/05/01 [id number]        64 y.o.  G0P0 presents with her husband to discuss her most recent bone density which shows osteoporosis T score -2.5 at the AP spine. Secondary workup was all normal to include a normal TSH, PTH, serum calcium, vitamin D and 24-hour urine calcium excretion.  Comparison to her last bone density 2013 shows a statistically significant decline at the spine of 8.8% and left hip of 4.7%.  She took Fosamax 1998 and 1999 but discontinued due to foot pain and took Actonel/Atelvia 2006 through 2009. She  Has an issue with chronic pain which limits her mobility from a weight bearing exercise standpoint.  Past medical history,surgical history, problem list, medications, allergies, family history and social history were all reviewed and documented in the EPIC chart.  Directed ROS with pertinent positives and negatives documented in the history of present illness/assessment and plan.  Exam: Filed Vitals:   01/22/15 1552  BP: 120/70   General appearance:  Normal   Assessment/Plan:  64 y.o. G0P0 With osteoporosisand a statistically significant decline in her spine and left hip. Prior use of bisphosphate for approximately 5 your total. I reviewed options with the patient and her husband and I discussed all available medications to include oral bisphosphonates, Reclast, Prolia, Evista, Forteo. The risks/benefits, side effects were reviewed. GERD, osteonecrosis of the jaw, atypical fractures particularly with prolonged use rashes and infections all reviewed. Options for referral for second opinion with endocrinologist such as Dr. Benjiman Core discussed. Patient came prepared with many questions from Internet studies.  At this point the patient wants to start back on [place] she did well with this from a side effect standpoint. She understands the issues of atypical fractures particularly with prolonged use and we are going to plan on a short interval study in 12-14  months to assess response. Patient will call if she has any issues or questions.   Time spent face-to-face in excess of 30 minutes.    Anastasio Auerbach MD, 4:39 PM 01/22/2015

## 2015-01-29 DIAGNOSIS — M79671 Pain in right foot: Secondary | ICD-10-CM | POA: Diagnosis not present

## 2015-03-09 DIAGNOSIS — J029 Acute pharyngitis, unspecified: Secondary | ICD-10-CM | POA: Diagnosis not present

## 2015-03-09 DIAGNOSIS — J019 Acute sinusitis, unspecified: Secondary | ICD-10-CM | POA: Diagnosis not present

## 2015-04-03 DIAGNOSIS — M797 Fibromyalgia: Secondary | ICD-10-CM | POA: Diagnosis not present

## 2015-04-03 DIAGNOSIS — R201 Hypoesthesia of skin: Secondary | ICD-10-CM | POA: Diagnosis not present

## 2015-04-27 DIAGNOSIS — M542 Cervicalgia: Secondary | ICD-10-CM | POA: Diagnosis not present

## 2015-04-27 DIAGNOSIS — R51 Headache: Secondary | ICD-10-CM | POA: Diagnosis not present

## 2015-04-27 DIAGNOSIS — Z981 Arthrodesis status: Secondary | ICD-10-CM | POA: Diagnosis not present

## 2015-04-27 DIAGNOSIS — G4486 Cervicogenic headache: Secondary | ICD-10-CM | POA: Insufficient documentation

## 2015-05-31 DIAGNOSIS — H40033 Anatomical narrow angle, bilateral: Secondary | ICD-10-CM | POA: Diagnosis not present

## 2015-05-31 DIAGNOSIS — H43813 Vitreous degeneration, bilateral: Secondary | ICD-10-CM | POA: Diagnosis not present

## 2015-05-31 DIAGNOSIS — H2513 Age-related nuclear cataract, bilateral: Secondary | ICD-10-CM | POA: Diagnosis not present

## 2015-07-03 DIAGNOSIS — Z853 Personal history of malignant neoplasm of breast: Secondary | ICD-10-CM | POA: Diagnosis not present

## 2015-07-03 DIAGNOSIS — Z1231 Encounter for screening mammogram for malignant neoplasm of breast: Secondary | ICD-10-CM | POA: Diagnosis not present

## 2015-07-18 DIAGNOSIS — G8929 Other chronic pain: Secondary | ICD-10-CM | POA: Diagnosis not present

## 2015-07-18 DIAGNOSIS — M810 Age-related osteoporosis without current pathological fracture: Secondary | ICD-10-CM | POA: Diagnosis not present

## 2015-07-18 DIAGNOSIS — I889 Nonspecific lymphadenitis, unspecified: Secondary | ICD-10-CM | POA: Diagnosis not present

## 2015-07-18 DIAGNOSIS — Z23 Encounter for immunization: Secondary | ICD-10-CM | POA: Diagnosis not present

## 2015-07-18 DIAGNOSIS — I700 Atherosclerosis of aorta: Secondary | ICD-10-CM | POA: Diagnosis not present

## 2015-08-22 DIAGNOSIS — M797 Fibromyalgia: Secondary | ICD-10-CM | POA: Diagnosis not present

## 2015-10-25 DIAGNOSIS — H538 Other visual disturbances: Secondary | ICD-10-CM | POA: Diagnosis not present

## 2015-10-25 DIAGNOSIS — M797 Fibromyalgia: Secondary | ICD-10-CM | POA: Diagnosis not present

## 2015-10-30 DIAGNOSIS — L218 Other seborrheic dermatitis: Secondary | ICD-10-CM | POA: Diagnosis not present

## 2015-10-30 DIAGNOSIS — D2239 Melanocytic nevi of other parts of face: Secondary | ICD-10-CM | POA: Diagnosis not present

## 2015-10-30 DIAGNOSIS — D225 Melanocytic nevi of trunk: Secondary | ICD-10-CM | POA: Diagnosis not present

## 2015-10-30 DIAGNOSIS — D1801 Hemangioma of skin and subcutaneous tissue: Secondary | ICD-10-CM | POA: Diagnosis not present

## 2015-10-30 DIAGNOSIS — D2271 Melanocytic nevi of right lower limb, including hip: Secondary | ICD-10-CM | POA: Diagnosis not present

## 2015-10-30 DIAGNOSIS — L718 Other rosacea: Secondary | ICD-10-CM | POA: Diagnosis not present

## 2015-10-30 DIAGNOSIS — L821 Other seborrheic keratosis: Secondary | ICD-10-CM | POA: Diagnosis not present

## 2015-10-30 DIAGNOSIS — D2221 Melanocytic nevi of right ear and external auricular canal: Secondary | ICD-10-CM | POA: Diagnosis not present

## 2015-11-14 DIAGNOSIS — H2513 Age-related nuclear cataract, bilateral: Secondary | ICD-10-CM | POA: Diagnosis not present

## 2015-11-14 DIAGNOSIS — H40033 Anatomical narrow angle, bilateral: Secondary | ICD-10-CM | POA: Diagnosis not present

## 2015-11-14 DIAGNOSIS — H43813 Vitreous degeneration, bilateral: Secondary | ICD-10-CM | POA: Diagnosis not present

## 2015-12-21 DIAGNOSIS — Z23 Encounter for immunization: Secondary | ICD-10-CM | POA: Diagnosis not present

## 2015-12-26 DIAGNOSIS — M797 Fibromyalgia: Secondary | ICD-10-CM | POA: Diagnosis not present

## 2015-12-26 DIAGNOSIS — H538 Other visual disturbances: Secondary | ICD-10-CM | POA: Diagnosis not present

## 2015-12-28 DIAGNOSIS — J069 Acute upper respiratory infection, unspecified: Secondary | ICD-10-CM | POA: Diagnosis not present

## 2015-12-28 DIAGNOSIS — J029 Acute pharyngitis, unspecified: Secondary | ICD-10-CM | POA: Diagnosis not present

## 2016-01-10 ENCOUNTER — Ambulatory Visit: Payer: Medicare Other | Admitting: Gynecology

## 2016-01-11 ENCOUNTER — Encounter: Payer: Self-pay | Admitting: Gynecology

## 2016-01-11 ENCOUNTER — Ambulatory Visit (INDEPENDENT_AMBULATORY_CARE_PROVIDER_SITE_OTHER): Payer: Medicare Other | Admitting: Gynecology

## 2016-01-11 VITALS — BP 118/70

## 2016-01-11 DIAGNOSIS — M810 Age-related osteoporosis without current pathological fracture: Secondary | ICD-10-CM | POA: Diagnosis not present

## 2016-01-11 MED ORDER — RISEDRONATE SODIUM 35 MG PO TBEC: 1.0000 | DELAYED_RELEASE_TABLET | ORAL | 12 refills | Status: DC

## 2016-01-11 NOTE — Progress Notes (Signed)
    TANDRIA STAUP 07-15-1950 [id number]        65 y.o.  G0P0 presents to discuss several issues. Currently on Atelvia for osteoporosis. Started 1 year ago. We had talked about a short interval study at that time but she is questioning whether she would prefer to wait a full 2 years before doing the follow up bone density. She also asked about annual exams and Medicare coverage.  Past medical history,surgical history, problem list, medications, allergies, family history and social history were all reviewed and documented in the EPIC chart.  Directed ROS with pertinent positives and negatives documented in the history of present illness/assessment and plan.  Exam: Vitals:   01/11/16 1217  BP: 118/70   General appearance:  Normal   Assessment/Plan:  65 y.o. G0P0 With osteoporosis currently on Atelvia. We had a lengthy discussion 01/22/2015 as far as options and she elected to start the medication. She is doing well from a tolerance standpoint. We discussed a short interval study. I think given that she is doing well with this we can certainly extend her density out till next year at a 2 year interval. I refilled her [place] 1 year. She is due for exam at this point. She asked me about Medicare coverage pain every 2 years. I reviewed with her that this is in a low risk patient. I reviewed with her the benefits of exam particularly in a patient with a history of breast cancer should have a provider breast exam annually. 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 Lastly she is complaining today of vaginal dryness and irritation which is also needs to be addressed. Patient is going to schedule an exam to see me which I think is appropriate.  Greater than 50% of my time was spent in direct face to face counseling and coordination of care with the patient.     Anastasio Auerbach MD, 12:54 PM 01/11/2016

## 2016-01-11 NOTE — Patient Instructions (Signed)
Follow up for exam as we discussed.

## 2016-01-12 LAB — VITAMIN D 25 HYDROXY (VIT D DEFICIENCY, FRACTURES): Vit D, 25-Hydroxy: 32 ng/mL (ref 30–100)

## 2016-01-18 DIAGNOSIS — H43813 Vitreous degeneration, bilateral: Secondary | ICD-10-CM | POA: Diagnosis not present

## 2016-01-18 DIAGNOSIS — H2513 Age-related nuclear cataract, bilateral: Secondary | ICD-10-CM | POA: Diagnosis not present

## 2016-01-18 DIAGNOSIS — H40033 Anatomical narrow angle, bilateral: Secondary | ICD-10-CM | POA: Diagnosis not present

## 2016-03-26 DIAGNOSIS — M797 Fibromyalgia: Secondary | ICD-10-CM | POA: Diagnosis not present

## 2016-03-26 DIAGNOSIS — M542 Cervicalgia: Secondary | ICD-10-CM | POA: Diagnosis not present

## 2016-03-28 ENCOUNTER — Encounter: Payer: Self-pay | Admitting: Gastroenterology

## 2016-04-08 ENCOUNTER — Encounter: Payer: Medicare Other | Admitting: Gynecology

## 2016-05-14 ENCOUNTER — Ambulatory Visit (INDEPENDENT_AMBULATORY_CARE_PROVIDER_SITE_OTHER): Payer: Medicare Other | Admitting: Gynecology

## 2016-05-14 ENCOUNTER — Encounter: Payer: Self-pay | Admitting: Gynecology

## 2016-05-14 VITALS — BP 116/74 | Ht 65.0 in | Wt 115.0 lb

## 2016-05-14 DIAGNOSIS — M810 Age-related osteoporosis without current pathological fracture: Secondary | ICD-10-CM

## 2016-05-14 DIAGNOSIS — Z01411 Encounter for gynecological examination (general) (routine) with abnormal findings: Secondary | ICD-10-CM | POA: Diagnosis not present

## 2016-05-14 DIAGNOSIS — N952 Postmenopausal atrophic vaginitis: Secondary | ICD-10-CM

## 2016-05-14 DIAGNOSIS — Z779 Other contact with and (suspected) exposures hazardous to health: Secondary | ICD-10-CM | POA: Diagnosis not present

## 2016-05-14 DIAGNOSIS — Z01419 Encounter for gynecological examination (general) (routine) without abnormal findings: Secondary | ICD-10-CM | POA: Diagnosis not present

## 2016-05-14 DIAGNOSIS — Z9189 Other specified personal risk factors, not elsewhere classified: Secondary | ICD-10-CM | POA: Diagnosis not present

## 2016-05-14 MED ORDER — MUPIROCIN 2 % EX OINT: 1.0000 "application " | TOPICAL_OINTMENT | Freq: Two times a day (BID) | CUTANEOUS | 1 refills | Status: DC

## 2016-05-14 NOTE — Progress Notes (Addendum)
    [name] [name] 02-14-1951 [id number]        66 y.o.  G0P0 for breast and pelvic exam.  Past medical history,surgical history, problem list, medications, allergies, family history and social history were all reviewed and documented as reviewed in the EPIC chart.  ROS:  Performed with pertinent positives and negatives included in the history, assessment and plan.   Additional significant findings :  None   Exam: Caryn Bee assistant Vitals:   05/14/16 1357  BP: 116/74  Weight: 115 lb (52.2 kg)  Height: 5\' 5"  (1.651 m)   Body mass index is 19.14 kg/m.  General appearance:  Normal affect, orientation and appearance. Skin: Grossly normal HEENT: Without gross lesions.  No cervical or supraclavicular adenopathy. Thyroid normal.  Lungs:  Clear without wheezing, rales or rhonchi Cardiac: RR, without RMG Abdominal:  Soft, nontender, without masses, guarding, rebound, organomegaly or hernia Breasts:  Examined lying and sitting without masses, retractions, discharge or axillary adenopathy.  Well-healed right lumpectomy scar Pelvic:  Ext, BUS, Vagina: With atrophic changes  Cervix: With atrophic changes  Uterus: Anteverted, normal size, shape and contour, midline and mobile nontender   Adnexa: Without masses or tenderness    Anus and perineum: Normal   Rectovaginal: Normal sphincter tone without palpated masses or tenderness.    Assessment/Plan:  66 y.o. G0P0 female for breast and pelvic exam  1. Postmenopausal/atrophic genital changes. No significant hot flushes, night sweats, vaginal dryness or any vaginal bleeding. Continue to monitor and report any issues or bleeding. 2. Patient asked for a refill of Bactroban 2% ointment that she received elsewhere but uses on her perineum when she has irritation which quickly resolves it. Refill times to tubes provided. 3. Family history of melanoma 2 in her sisters to include vulvar melanoma. Vulvar exam is normal. Self vulvar exams  reviewed with annual provider vulvar exam commended. 4. Osteoporosis. DEXA 09/2014 T score -2.5. On Atelvia started approximately 12/2014. Doing well on this. We'll continue and repeat her bone density in this coming fall. Patient knows to follow up for this. 5. Right-sided breast cancer. Exam NED. Mammography coming due in April and I reminded her to schedule this. SBE monthly reviewed. 6. Pap smear/HPV 12/2012. No Pap smear done today. History of cryosurgery 1980 with normal Pap smears afterwards. Plan repeat Pap smear at 5 year interval per current screening guidelines. 7. Colonoscopy 2014. Repeat at their recommended interval. 8. Health maintenance. No routine lab work done as patient does this elsewhere. Follow up for DEXA this coming fall as scheduled. Follow up for annual exam in one year.   Anastasio Auerbach MD, 2:40 PM 05/14/2016

## 2016-05-14 NOTE — Patient Instructions (Signed)
Follow up for your bone density this coming fall.

## 2016-05-28 ENCOUNTER — Encounter: Payer: Self-pay | Admitting: Gynecology

## 2016-06-09 DIAGNOSIS — J0100 Acute maxillary sinusitis, unspecified: Secondary | ICD-10-CM | POA: Diagnosis not present

## 2016-06-09 DIAGNOSIS — R911 Solitary pulmonary nodule: Secondary | ICD-10-CM | POA: Diagnosis not present

## 2016-06-09 DIAGNOSIS — R05 Cough: Secondary | ICD-10-CM | POA: Diagnosis not present

## 2016-06-10 ENCOUNTER — Ambulatory Visit
Admission: RE | Admit: 2016-06-10 | Discharge: 2016-06-10 | Disposition: A | Discharge status: Home / Self Care | Payer: Medicare Other | Source: Ambulatory Visit | Attending: Family Medicine | Admitting: Family Medicine

## 2016-06-10 ENCOUNTER — Other Ambulatory Visit: Payer: Self-pay | Admitting: Family Medicine

## 2016-06-10 DIAGNOSIS — R918 Other nonspecific abnormal finding of lung field: Secondary | ICD-10-CM

## 2016-06-10 DIAGNOSIS — R05 Cough: Secondary | ICD-10-CM | POA: Diagnosis not present

## 2016-06-17 DIAGNOSIS — G4483 Primary cough headache: Secondary | ICD-10-CM | POA: Diagnosis not present

## 2016-06-17 DIAGNOSIS — J329 Chronic sinusitis, unspecified: Secondary | ICD-10-CM | POA: Diagnosis not present

## 2016-06-17 DIAGNOSIS — M797 Fibromyalgia: Secondary | ICD-10-CM | POA: Diagnosis not present

## 2016-06-17 DIAGNOSIS — R51 Headache: Secondary | ICD-10-CM | POA: Diagnosis not present

## 2016-07-16 DIAGNOSIS — H40033 Anatomical narrow angle, bilateral: Secondary | ICD-10-CM | POA: Diagnosis not present

## 2016-07-16 DIAGNOSIS — H2513 Age-related nuclear cataract, bilateral: Secondary | ICD-10-CM | POA: Diagnosis not present

## 2016-07-16 DIAGNOSIS — H43813 Vitreous degeneration, bilateral: Secondary | ICD-10-CM | POA: Diagnosis not present

## 2016-07-22 ENCOUNTER — Encounter: Payer: Self-pay | Admitting: Gynecology

## 2016-07-22 DIAGNOSIS — Z853 Personal history of malignant neoplasm of breast: Secondary | ICD-10-CM | POA: Diagnosis not present

## 2016-07-22 DIAGNOSIS — Z1231 Encounter for screening mammogram for malignant neoplasm of breast: Secondary | ICD-10-CM | POA: Diagnosis not present

## 2016-09-11 DIAGNOSIS — M797 Fibromyalgia: Secondary | ICD-10-CM | POA: Diagnosis not present

## 2016-09-28 DIAGNOSIS — M25552 Pain in left hip: Secondary | ICD-10-CM | POA: Diagnosis not present

## 2016-12-03 DIAGNOSIS — D2239 Melanocytic nevi of other parts of face: Secondary | ICD-10-CM | POA: Diagnosis not present

## 2016-12-03 DIAGNOSIS — L718 Other rosacea: Secondary | ICD-10-CM | POA: Diagnosis not present

## 2016-12-03 DIAGNOSIS — L2089 Other atopic dermatitis: Secondary | ICD-10-CM | POA: Diagnosis not present

## 2016-12-03 DIAGNOSIS — L821 Other seborrheic keratosis: Secondary | ICD-10-CM | POA: Diagnosis not present

## 2016-12-03 DIAGNOSIS — D2271 Melanocytic nevi of right lower limb, including hip: Secondary | ICD-10-CM | POA: Diagnosis not present

## 2016-12-03 DIAGNOSIS — D692 Other nonthrombocytopenic purpura: Secondary | ICD-10-CM | POA: Diagnosis not present

## 2016-12-03 DIAGNOSIS — D1801 Hemangioma of skin and subcutaneous tissue: Secondary | ICD-10-CM | POA: Diagnosis not present

## 2016-12-03 DIAGNOSIS — L814 Other melanin hyperpigmentation: Secondary | ICD-10-CM | POA: Diagnosis not present

## 2016-12-03 DIAGNOSIS — D2221 Melanocytic nevi of right ear and external auricular canal: Secondary | ICD-10-CM | POA: Diagnosis not present

## 2016-12-03 DIAGNOSIS — D225 Melanocytic nevi of trunk: Secondary | ICD-10-CM | POA: Diagnosis not present

## 2016-12-03 DIAGNOSIS — L720 Epidermal cyst: Secondary | ICD-10-CM | POA: Diagnosis not present

## 2016-12-10 DIAGNOSIS — Z0000 Encounter for general adult medical examination without abnormal findings: Secondary | ICD-10-CM | POA: Diagnosis not present

## 2016-12-10 DIAGNOSIS — F341 Dysthymic disorder: Secondary | ICD-10-CM | POA: Diagnosis not present

## 2016-12-10 DIAGNOSIS — M797 Fibromyalgia: Secondary | ICD-10-CM | POA: Diagnosis not present

## 2016-12-19 DIAGNOSIS — Z23 Encounter for immunization: Secondary | ICD-10-CM | POA: Diagnosis not present

## 2017-01-12 ENCOUNTER — Ambulatory Visit (INDEPENDENT_AMBULATORY_CARE_PROVIDER_SITE_OTHER): Payer: Medicare Other

## 2017-01-12 ENCOUNTER — Other Ambulatory Visit: Payer: Self-pay | Admitting: Gynecology

## 2017-01-12 DIAGNOSIS — M810 Age-related osteoporosis without current pathological fracture: Secondary | ICD-10-CM

## 2017-01-13 ENCOUNTER — Encounter: Payer: Self-pay | Admitting: Gynecology

## 2017-01-19 ENCOUNTER — Other Ambulatory Visit: Payer: Self-pay | Admitting: Gynecology

## 2017-01-28 DIAGNOSIS — H40022 Open angle with borderline findings, high risk, left eye: Secondary | ICD-10-CM | POA: Diagnosis not present

## 2017-01-29 DIAGNOSIS — J029 Acute pharyngitis, unspecified: Secondary | ICD-10-CM | POA: Diagnosis not present

## 2017-01-29 DIAGNOSIS — J069 Acute upper respiratory infection, unspecified: Secondary | ICD-10-CM | POA: Diagnosis not present

## 2017-02-25 DIAGNOSIS — M797 Fibromyalgia: Secondary | ICD-10-CM | POA: Diagnosis not present

## 2017-02-25 DIAGNOSIS — R0789 Other chest pain: Secondary | ICD-10-CM | POA: Diagnosis not present

## 2017-03-13 DIAGNOSIS — J069 Acute upper respiratory infection, unspecified: Secondary | ICD-10-CM | POA: Diagnosis not present

## 2017-03-13 DIAGNOSIS — J0100 Acute maxillary sinusitis, unspecified: Secondary | ICD-10-CM | POA: Diagnosis not present

## 2017-05-13 DIAGNOSIS — H2513 Age-related nuclear cataract, bilateral: Secondary | ICD-10-CM | POA: Diagnosis not present

## 2017-05-13 DIAGNOSIS — H40022 Open angle with borderline findings, high risk, left eye: Secondary | ICD-10-CM | POA: Diagnosis not present

## 2017-05-13 DIAGNOSIS — H43813 Vitreous degeneration, bilateral: Secondary | ICD-10-CM | POA: Diagnosis not present

## 2017-05-16 ENCOUNTER — Other Ambulatory Visit: Payer: Self-pay | Admitting: Gynecology

## 2017-05-18 NOTE — Telephone Encounter (Signed)
Annual scheduled on 06/30/17

## 2017-06-30 ENCOUNTER — Encounter: Payer: Self-pay | Admitting: Gynecology

## 2017-06-30 ENCOUNTER — Ambulatory Visit (INDEPENDENT_AMBULATORY_CARE_PROVIDER_SITE_OTHER): Payer: Medicare Other | Admitting: Gynecology

## 2017-06-30 VITALS — BP 118/74 | Ht 65.0 in | Wt 120.0 lb

## 2017-06-30 DIAGNOSIS — Z779 Other contact with and (suspected) exposures hazardous to health: Secondary | ICD-10-CM | POA: Diagnosis not present

## 2017-06-30 DIAGNOSIS — Z01411 Encounter for gynecological examination (general) (routine) with abnormal findings: Secondary | ICD-10-CM

## 2017-06-30 DIAGNOSIS — Z853 Personal history of malignant neoplasm of breast: Secondary | ICD-10-CM

## 2017-06-30 DIAGNOSIS — M810 Age-related osteoporosis without current pathological fracture: Secondary | ICD-10-CM

## 2017-06-30 DIAGNOSIS — N952 Postmenopausal atrophic vaginitis: Secondary | ICD-10-CM

## 2017-06-30 NOTE — Progress Notes (Signed)
    [name] [name] 12-31-1950 [id number]        66 y.o.  G0P0 for breast and pelvic exam.  Without gynecologic complaints.  Past medical history,surgical history, problem list, medications, allergies, family history and social history were all reviewed and documented as reviewed in the EPIC chart.  ROS:  Performed with pertinent positives and negatives included in the history, assessment and plan.   Additional significant findings : None   Exam: Caryn Bee assistant Vitals:   06/30/17 1400  BP: 118/74  Weight: 120 lb (54.4 kg)  Height: 5\' 5"  (1.651 m)   Body mass index is 19.97 kg/m.  General appearance:  Normal affect, orientation and appearance. Skin: Grossly normal HEENT: Without gross lesions.  No cervical or supraclavicular adenopathy. Thyroid normal.  Lungs:  Clear without wheezing, rales or rhonchi Cardiac: RR, without RMG Abdominal:  Soft, nontender, without masses, guarding, rebound, organomegaly or hernia Breasts:  Examined lying and sitting without masses, retractions, discharge or axillary adenopathy.  Well-healed right lumpectomy scar Pelvic:  Ext, BUS, Vagina: With atrophic changes.  No lesions or abnormal pigmentation.  Cervix: With atrophic changes  Uterus: Anteverted, normal size, shape and contour, midline and mobile nontender   Adnexa: Without masses or tenderness    Anus and perineum: Normal   Rectovaginal: Normal sphincter tone without palpated masses or tenderness.    Assessment/Plan:  67 y.o. G0P0 female for breast and pelvic exam.   1. Postmenopausal/atrophic genital changes.  Without significant symptoms or any vaginal bleeding. 2. Osteoporosis.  DEXA 12/2016.  T score -2.3.  Prior DEXA 2014 T score -2.5.  On Atelvia started 12/2014.  Doing well with this.  Will continue for this coming year.  Has supply at home but will call when needs more. 3. Pap smear/HPV 12/2012.  Discussed Pap smear this year but patient prefers to wait till next year.   Concerned about Medicare reimbursement.  Options to stop screening altogether per current screening guidelines based on age also reviewed.  Will readdress next year.  History of cryosurgery 1980 with normal Pap smears since. 4. History of right-sided breast cancer.  Mammography coming due next month and I reminded her to schedule this.  Exam NED. 5. History of melanoma in her sisters to include vulvar melanoma.  Vulvar exam today is normal.  Patient will continue with self vulvar exams. 6. Colonoscopy 2014.  Repeat at their recommended interval. 7. Uses Bactroban 2% ointment for perineal irritation intermittently.  Does well for her.  Has supply at home but will call when needs more. 8. Health maintenance.  No routine lab work done as patient does this elsewhere.  Follow-up in 1 year, sooner as needed.   Anastasio Auerbach MD, 2:30 PM 06/30/2017

## 2017-06-30 NOTE — Patient Instructions (Signed)
Follow-up in 1 year for annual exam, sooner as needed. 

## 2017-07-14 ENCOUNTER — Other Ambulatory Visit: Payer: Self-pay | Admitting: Gynecology

## 2017-08-12 ENCOUNTER — Encounter: Payer: Self-pay | Admitting: Gynecology

## 2017-08-12 DIAGNOSIS — Z853 Personal history of malignant neoplasm of breast: Secondary | ICD-10-CM | POA: Diagnosis not present

## 2017-08-12 DIAGNOSIS — Z1231 Encounter for screening mammogram for malignant neoplasm of breast: Secondary | ICD-10-CM | POA: Diagnosis not present

## 2017-10-06 DIAGNOSIS — M797 Fibromyalgia: Secondary | ICD-10-CM | POA: Diagnosis not present

## 2017-10-06 DIAGNOSIS — G8929 Other chronic pain: Secondary | ICD-10-CM | POA: Diagnosis not present

## 2017-10-06 DIAGNOSIS — H6122 Impacted cerumen, left ear: Secondary | ICD-10-CM | POA: Diagnosis not present

## 2017-10-06 DIAGNOSIS — H60543 Acute eczematoid otitis externa, bilateral: Secondary | ICD-10-CM | POA: Diagnosis not present

## 2017-11-02 DIAGNOSIS — S90122A Contusion of left lesser toe(s) without damage to nail, initial encounter: Secondary | ICD-10-CM | POA: Diagnosis not present

## 2017-11-02 DIAGNOSIS — S92505A Nondisplaced unspecified fracture of left lesser toe(s), initial encounter for closed fracture: Secondary | ICD-10-CM | POA: Diagnosis not present

## 2017-11-09 DIAGNOSIS — S92504A Nondisplaced unspecified fracture of right lesser toe(s), initial encounter for closed fracture: Secondary | ICD-10-CM | POA: Diagnosis not present

## 2017-11-11 DIAGNOSIS — H43813 Vitreous degeneration, bilateral: Secondary | ICD-10-CM | POA: Diagnosis not present

## 2017-11-11 DIAGNOSIS — H40022 Open angle with borderline findings, high risk, left eye: Secondary | ICD-10-CM | POA: Diagnosis not present

## 2017-11-11 DIAGNOSIS — H2513 Age-related nuclear cataract, bilateral: Secondary | ICD-10-CM | POA: Diagnosis not present

## 2017-12-07 DIAGNOSIS — S92504D Nondisplaced unspecified fracture of right lesser toe(s), subsequent encounter for fracture with routine healing: Secondary | ICD-10-CM | POA: Diagnosis not present

## 2017-12-16 DIAGNOSIS — L718 Other rosacea: Secondary | ICD-10-CM | POA: Diagnosis not present

## 2017-12-16 DIAGNOSIS — L814 Other melanin hyperpigmentation: Secondary | ICD-10-CM | POA: Diagnosis not present

## 2017-12-16 DIAGNOSIS — L2089 Other atopic dermatitis: Secondary | ICD-10-CM | POA: Diagnosis not present

## 2017-12-16 DIAGNOSIS — L821 Other seborrheic keratosis: Secondary | ICD-10-CM | POA: Diagnosis not present

## 2017-12-16 DIAGNOSIS — D1801 Hemangioma of skin and subcutaneous tissue: Secondary | ICD-10-CM | POA: Diagnosis not present

## 2017-12-16 DIAGNOSIS — D2271 Melanocytic nevi of right lower limb, including hip: Secondary | ICD-10-CM | POA: Diagnosis not present

## 2017-12-16 DIAGNOSIS — D225 Melanocytic nevi of trunk: Secondary | ICD-10-CM | POA: Diagnosis not present

## 2017-12-17 DIAGNOSIS — Z23 Encounter for immunization: Secondary | ICD-10-CM | POA: Diagnosis not present

## 2018-01-21 DIAGNOSIS — Z0000 Encounter for general adult medical examination without abnormal findings: Secondary | ICD-10-CM | POA: Diagnosis not present

## 2018-01-21 DIAGNOSIS — G8929 Other chronic pain: Secondary | ICD-10-CM | POA: Diagnosis not present

## 2018-01-21 DIAGNOSIS — M810 Age-related osteoporosis without current pathological fracture: Secondary | ICD-10-CM | POA: Diagnosis not present

## 2018-01-21 DIAGNOSIS — M797 Fibromyalgia: Secondary | ICD-10-CM | POA: Diagnosis not present

## 2018-05-25 DIAGNOSIS — W19XXXA Unspecified fall, initial encounter: Secondary | ICD-10-CM | POA: Diagnosis not present

## 2018-05-25 DIAGNOSIS — S0181XA Laceration without foreign body of other part of head, initial encounter: Secondary | ICD-10-CM | POA: Diagnosis not present

## 2018-06-03 DIAGNOSIS — H43813 Vitreous degeneration, bilateral: Secondary | ICD-10-CM | POA: Diagnosis not present

## 2018-06-03 DIAGNOSIS — H2513 Age-related nuclear cataract, bilateral: Secondary | ICD-10-CM | POA: Diagnosis not present

## 2018-06-03 DIAGNOSIS — H40033 Anatomical narrow angle, bilateral: Secondary | ICD-10-CM | POA: Diagnosis not present

## 2018-06-03 DIAGNOSIS — H401421 Capsular glaucoma with pseudoexfoliation of lens, left eye, mild stage: Secondary | ICD-10-CM | POA: Diagnosis not present

## 2018-06-11 ENCOUNTER — Other Ambulatory Visit: Payer: Self-pay | Admitting: Gynecology

## 2018-06-14 NOTE — Telephone Encounter (Signed)
Annual exam is scheduled on 07/08/18

## 2018-07-08 ENCOUNTER — Encounter: Payer: Medicare Other | Admitting: Gynecology

## 2018-07-13 ENCOUNTER — Other Ambulatory Visit: Payer: Self-pay | Admitting: *Deleted

## 2018-07-13 MED ORDER — RISEDRONATE SODIUM 35 MG PO TBEC: 1.0000 | DELAYED_RELEASE_TABLET | ORAL | 1 refills | Status: DC

## 2018-07-13 NOTE — Telephone Encounter (Signed)
Annual scheduled on 08/24/18

## 2018-08-23 ENCOUNTER — Other Ambulatory Visit: Payer: Self-pay

## 2018-08-24 ENCOUNTER — Ambulatory Visit (INDEPENDENT_AMBULATORY_CARE_PROVIDER_SITE_OTHER): Payer: Medicare Other | Admitting: Gynecology

## 2018-08-24 ENCOUNTER — Encounter: Payer: Self-pay | Admitting: Gynecology

## 2018-08-24 VITALS — BP 120/74 | Ht 65.0 in | Wt 116.0 lb

## 2018-08-24 DIAGNOSIS — Z9289 Personal history of other medical treatment: Secondary | ICD-10-CM

## 2018-08-24 DIAGNOSIS — Z124 Encounter for screening for malignant neoplasm of cervix: Secondary | ICD-10-CM

## 2018-08-24 DIAGNOSIS — M810 Age-related osteoporosis without current pathological fracture: Secondary | ICD-10-CM

## 2018-08-24 DIAGNOSIS — N952 Postmenopausal atrophic vaginitis: Secondary | ICD-10-CM

## 2018-08-24 DIAGNOSIS — Z9189 Other specified personal risk factors, not elsewhere classified: Secondary | ICD-10-CM | POA: Diagnosis not present

## 2018-08-24 DIAGNOSIS — Z01419 Encounter for gynecological examination (general) (routine) without abnormal findings: Secondary | ICD-10-CM | POA: Diagnosis not present

## 2018-08-24 MED ORDER — MUPIROCIN 2 % EX OINT: 1.0000 "application " | TOPICAL_OINTMENT | CUTANEOUS | 2 refills | Status: DC | PRN

## 2018-08-24 NOTE — Patient Instructions (Signed)
Follow-up for your bone density this coming fall.  Follow-up for annual exam in 1 year.

## 2018-08-24 NOTE — Progress Notes (Signed)
    PEGI MILAZZO [birth year] [id number]        68 y.o.  G0P0 for breast and pelvic exam.  Notes left breast tenderness as discussed below.  Past medical history,surgical history, problem list, medications, allergies, family history and social history were all reviewed and documented as reviewed in the EPIC chart.  ROS:  Performed with pertinent positives and negatives included in the history, assessment and plan.   Additional significant findings : None   Exam: Caryn Bee assistant Vitals:   08/24/18 1356  BP: 120/74  Weight: 116 lb (52.6 kg)  Height: 5\' 5"  (1.651 m)   Body mass index is 19.3 kg/m.  General appearance:  Normal affect, orientation and appearance. Skin: Grossly normal HEENT: Without gross lesions.  No cervical or supraclavicular adenopathy. Thyroid normal.  Lungs:  Clear without wheezing, rales or rhonchi Cardiac: RR, without RMG Abdominal:  Soft, nontender, without masses, guarding, rebound, organomegaly or hernia Breasts:  Examined lying and sitting without masses, retractions, discharge or axillary adenopathy.  Well-healed right lumpectomy scar. Pelvic:  Ext, BUS, Vagina: With atrophic changes  Cervix: With atrophic changes.  Pap smear done  Uterus: Anteverted, normal size, shape and contour, midline and mobile nontender   Adnexa: Without masses or tenderness    Anus and perineum: Normal   Rectovaginal: Normal sphincter tone without palpated masses or tenderness.    Assessment/Plan:  68 y.o. G0P0 female for breast and pelvic exam  1. Postmenopausal.  No significant menopausal symptoms or any vaginal bleeding. 2. Osteoporosis.  DEXA 12/2016 T score -2.3 prior DEXA T score -2.5.  On Atelvia started 2016.  Will continue for now and repeat her bone density this coming fall.  She has a supply but will call when needs more. 3. History of right breast cancer.  Complaining of left breast pain over the past several weeks that come and go.  No palpable  abnormalities.  No nipple discharge.  Breast exam on the right NED.  Breast exam on the left with no palpable masses or other abnormalities.  Is coming due for mammogram.  Will plan diagnostic mammography on the left.  Continue with self breast exams and if any palpable abnormalities reevaluation.  Follow-up if pain persists. 4. History of vulvar melanoma in her sister.  Exam today is normal.  She will continue with self vulvar exams and annual physician exams. 5. Colonoscopy 2014.  Repeat at their recommended interval. 6. Pap smear/HPV 2014.  Pap smear done today.  History of cryo surgery 1980 with normal Pap smears since. 7. Health maintenance.  No routine lab work done as patient does this elsewhere.  Follow-up in 1 year, sooner as needed.   Anastasio Auerbach MD, 2:39 PM 08/24/2018

## 2018-08-24 NOTE — Addendum Note (Signed)
Addended by: Nelva Nay on: 08/24/2018 02:56 PM   Modules accepted: Orders

## 2018-08-25 ENCOUNTER — Telehealth: Payer: Self-pay | Admitting: *Deleted

## 2018-08-25 LAB — PAP IG W/ RFLX HPV ASCU

## 2018-08-25 NOTE — Telephone Encounter (Signed)
Patient scheduled on 09/01/18 @ 2:45pm at [facility], order faxed, aware to wear mask and come alone due to COVID-19.

## 2018-08-25 NOTE — Telephone Encounter (Signed)
-----   Message from Anastasio Auerbach, MD sent at 08/24/2018  2:42 PM EDT ----- Recommend diagnostic left mammography at [facility].  History of right breast cancer now with left breast tenderness over the past month.  Physician exam without palpable abnormalities.

## 2018-09-01 DIAGNOSIS — N644 Mastodynia: Secondary | ICD-10-CM | POA: Diagnosis not present

## 2018-09-02 ENCOUNTER — Encounter: Payer: Self-pay | Admitting: Gynecology

## 2018-09-06 ENCOUNTER — Other Ambulatory Visit: Payer: Self-pay

## 2018-09-06 MED ORDER — RISEDRONATE SODIUM 35 MG PO TBEC: 1.0000 | DELAYED_RELEASE_TABLET | ORAL | 11 refills | Status: DC

## 2018-09-22 DIAGNOSIS — Z1159 Encounter for screening for other viral diseases: Secondary | ICD-10-CM | POA: Diagnosis not present

## 2018-09-22 DIAGNOSIS — J329 Chronic sinusitis, unspecified: Secondary | ICD-10-CM | POA: Diagnosis not present

## 2018-10-06 DIAGNOSIS — M797 Fibromyalgia: Secondary | ICD-10-CM | POA: Diagnosis not present

## 2018-10-06 DIAGNOSIS — J309 Allergic rhinitis, unspecified: Secondary | ICD-10-CM | POA: Diagnosis not present

## 2018-11-23 ENCOUNTER — Other Ambulatory Visit: Payer: Self-pay

## 2018-11-23 ENCOUNTER — Encounter (HOSPITAL_BASED_OUTPATIENT_CLINIC_OR_DEPARTMENT_OTHER): Payer: Self-pay | Admitting: *Deleted

## 2018-11-23 ENCOUNTER — Emergency Department (HOSPITAL_BASED_OUTPATIENT_CLINIC_OR_DEPARTMENT_OTHER)
Admission: EM | Admit: 2018-11-23 | Discharge: 2018-11-23 | Disposition: A | Discharge status: Home / Self Care | Payer: Medicare Other | Attending: Emergency Medicine | Admitting: Emergency Medicine

## 2018-11-23 ENCOUNTER — Emergency Department (HOSPITAL_BASED_OUTPATIENT_CLINIC_OR_DEPARTMENT_OTHER): Payer: Medicare Other

## 2018-11-23 DIAGNOSIS — N839 Noninflammatory disorder of ovary, fallopian tube and broad ligament, unspecified: Secondary | ICD-10-CM | POA: Diagnosis not present

## 2018-11-23 DIAGNOSIS — N838 Other noninflammatory disorders of ovary, fallopian tube and broad ligament: Secondary | ICD-10-CM | POA: Diagnosis not present

## 2018-11-23 DIAGNOSIS — R109 Unspecified abdominal pain: Secondary | ICD-10-CM | POA: Diagnosis not present

## 2018-11-23 DIAGNOSIS — R8290 Unspecified abnormal findings in urine: Secondary | ICD-10-CM | POA: Diagnosis not present

## 2018-11-23 DIAGNOSIS — Z853 Personal history of malignant neoplasm of breast: Secondary | ICD-10-CM | POA: Diagnosis not present

## 2018-11-23 LAB — CBC WITH DIFFERENTIAL/PLATELET
Abs Immature Granulocytes: 0.03 10*3/uL (ref 0.00–0.07)
Basophils Absolute: 0 10*3/uL (ref 0.0–0.1)
Basophils Relative: 0 %
Eosinophils Absolute: 0 10*3/uL (ref 0.0–0.5)
Eosinophils Relative: 0 %
HCT: 39 % (ref 36.0–46.0)
Hemoglobin: 12.4 g/dL (ref 12.0–15.0)
Immature Granulocytes: 0 %
Lymphocytes Relative: 14 %
Lymphs Abs: 1.3 10*3/uL (ref 0.7–4.0)
MCH: 28.4 pg (ref 26.0–34.0)
MCHC: 31.8 g/dL (ref 30.0–36.0)
MCV: 89.4 fL (ref 80.0–100.0)
Monocytes Absolute: 0.7 10*3/uL (ref 0.1–1.0)
Monocytes Relative: 7 %
Neutro Abs: 7.8 10*3/uL — ABNORMAL HIGH (ref 1.7–7.7)
Neutrophils Relative %: 79 %
Platelets: 459 10*3/uL — ABNORMAL HIGH (ref 150–400)
RBC: 4.36 MIL/uL (ref 3.87–5.11)
RDW: 13.8 % (ref 11.5–15.5)
WBC: 9.9 10*3/uL (ref 4.0–10.5)
nRBC: 0 % (ref 0.0–0.2)

## 2018-11-23 LAB — COMPREHENSIVE METABOLIC PANEL
ALT: 23 U/L (ref 0–44)
AST: 34 U/L (ref 15–41)
Albumin: 3.6 g/dL (ref 3.5–5.0)
Alkaline Phosphatase: 79 U/L (ref 38–126)
Anion gap: 15 (ref 5–15)
BUN: 9 mg/dL (ref 8–23)
CO2: 21 mmol/L — ABNORMAL LOW (ref 22–32)
Calcium: 8.8 mg/dL — ABNORMAL LOW (ref 8.9–10.3)
Chloride: 94 mmol/L — ABNORMAL LOW (ref 98–111)
Creatinine, Ser: 0.48 mg/dL (ref 0.44–1.00)
GFR calc Af Amer: >60 mL/min (ref >60)
GFR calc non Af Amer: >60 mL/min (ref >60)
Glucose, Bld: 85 mg/dL (ref 70–99)
Potassium: 3.5 mmol/L (ref 3.5–5.1)
Sodium: 130 mmol/L — ABNORMAL LOW (ref 135–145)
Total Bilirubin: 0.6 mg/dL (ref 0.3–1.2)
Total Protein: 7.1 g/dL (ref 6.5–8.1)

## 2018-11-23 LAB — LIPASE, BLOOD: Lipase: 31 U/L (ref 11–51)

## 2018-11-23 MED ORDER — IOHEXOL 300 MG/ML  SOLN
100.0000 mL | Freq: Once | INTRAMUSCULAR | Status: AC | PRN
  Administered 2018-11-23: 17:00:00 100 mL via INTRAVENOUS

## 2018-11-23 NOTE — ED Triage Notes (Signed)
She was seen by her Md today with abdominal pain and distention x 3 days. She was sent to the ED for further evaluation.

## 2018-11-23 NOTE — ED Provider Notes (Signed)
[facility] EMERGENCY DEPARTMENT Provider Note   CSN: [id number] Arrival date & time: 11/23/18  1552     History   Chief Complaint Chief Complaint  Patient presents with  . Abdominal Pain    HPI [name] [name] is a 68 y.o. female.      Abdominal Pain Pain location:  Generalized Pain quality: bloating   Pain radiates to:  Does not radiate Pain severity:  Mild Onset quality:  Gradual Duration:  1 week Timing:  Constant Progression:  Unchanged Chronicity:  New Context: not alcohol use, not previous surgeries, not sick contacts and not suspicious food intake   Relieved by:  Nothing Worsened by:  Nothing Associated symptoms: flatus   Associated symptoms: no chest pain, no chills, no constipation, no cough, no dysuria, no fever, no hematuria, no melena, no nausea, no shortness of breath, no sore throat and no vomiting   Risk factors: has not had multiple surgeries     Past Medical History:  Diagnosis Date  . Allergic rhinitis   . Cervical dysplasia 1980  . Cervicalgia   . Eczema    Rosacea,dermatitis-Dr [employer]  . Esophageal reflux   . Fibromyalgia   . GERD (gastroesophageal reflux disease)   . Glaucoma, narrow-angle   . Heart murmur   . Hiatal hernia   . Irritable bowel syndrome   . LBBB (left bundle branch block)   . Left bundle branch block   . 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 Mitral valve disorders(424.0)    No antibiotics required  . Osteoporosis 12/2016   T score -2.3, 2014 T score -2.5 AP spine  . PAC (premature atrial contraction) 2012   ,PVC's, and nonsustained atril tachycardia w aberration by heart monitor   . PVC (premature ventricular contraction)   . Stricture and stenosis of esophagus     Patient Active Problem List   Diagnosis Date Noted  . LBBB (left bundle branch block) 05/18/2013  . Atrial tachycardia, paroxysmal ([place])  05/18/2013  . PVC (premature ventricular contraction)   . PAC (premature atrial contraction)   . Breast cancer ([place]) 07/10/2011  . IBS (irritable bowel syndrome) 11/12/2010  . Bacterial overgrowth syndrome 11/12/2010  . GERD (gastroesophageal reflux disease) 11/12/2010  . PERIPHERAL NEUROPATHY 04/17/2008  . 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 ESOPHAGEAL STRICTURE 04/17/2008  . HIATAL HERNIA 04/17/2008  . NECK PAIN 04/17/2008    Past Surgical History:  Procedure Laterality Date  . BREAST LUMPECTOMY  1998   right breast with radiation  . CERVICAL DISCECTOMY     C3-5 with bone graft  . GYNECOLOGIC CRYOSURGERY  1980  . NECK SURGERY  [phone]   bone graft and fusion  . REFRACTIVE SURGERY     narrow angle glucoma  . RHINOPLASTY  1976  . TUBAL LIGATION  1977     OB History    Gravida  0   Para      Term      Preterm      AB      Living        SAB      TAB      Ectopic      Multiple      Live Births               Home Medications    Prior to Admission medications  Medication Sig Start Date End Date Taking? Authorizing Provider  Azelaic Acid (FINACEA EX) Apply topically daily.      [provider]  Calcium Carbonate-Vitamin D (CALTRATE 600+D) 600-400 MG-UNIT per tablet Take 1 tablet by mouth daily.     [provider]  cromolyn (NASALCROM) 5.2 MG/ACT nasal spray Place 1 spray into the nose as needed.     [provider]  gabapentin (NEURONTIN) 300 MG capsule Take 600 mg by mouth 3 (three) times daily.  04/05/13   [provider]  mupirocin ointment (BACTROBAN) 2 % Apply 1 application topically as needed. 08/24/18   Fontaine, Belinda Block, MD  Risedronate Sodium 35 MG TBEC Take 1 tablet (35 mg total) by mouth once a week. 09/06/18   Fontaine, Belinda Block, MD  Sulfacetamide Sodium 10 % CREA Apply topically at bedtime.      [provider]    Family History Family History  Problem Relation Age of Onset  . Heart  disease Mother   . Depression Mother   . Hypertension Mother   . Heart disease Father   . Hypertension Father   . Aneurysm Father   . Uterine cancer Sister   . Hypertension Sister   . Ovarian cancer Sister   . Melanoma Sister   . Heart disease Sister   . Alcohol abuse Sister   . Depression Sister   . Melanoma Sister   . Heart disease Paternal Grandfather   . Stroke Maternal Grandmother   . Cancer Maternal Grandfather        Pancreatic  . Stroke Paternal Grandmother   . Colon cancer Neg Hx     Social History Social History   Tobacco Use  . Smoking status: Never Smoker  . Smokeless tobacco: Never Used  Substance Use Topics  . Alcohol use: Yes    Alcohol/week: 2.0 standard drinks    Types: 2 Standard drinks or equivalent per week    Comment: Social   . Drug use: No     Allergies   Avelox [moxifloxacin hcl in nacl], Doxycycline, Erythromycin, Metronidazole, Minocycline, Oxybutynin chloride, Penicillins, Quinolones, Sulfonamide derivatives, Thimerosal, and Tramadol   Review of Systems Review of Systems  Constitutional: Negative for chills and fever.  HENT: Negative for ear pain and sore throat.   Eyes: Negative for pain and visual disturbance.  Respiratory: Negative for cough and shortness of breath.   Cardiovascular: Negative for chest pain and palpitations.  Gastrointestinal: Positive for abdominal pain and flatus. Negative for constipation, melena, nausea and vomiting.  Genitourinary: Negative for dysuria and hematuria.  Musculoskeletal: Negative for arthralgias and back pain.  Skin: Negative for color change and rash.  Neurological: Negative for seizures and syncope.  All other systems reviewed and are negative.    Physical Exam Updated Vital Signs BP 140/66 (BP Location: Left Arm)   Pulse (!) 109   Temp 98.2 F (36.8 C) (Oral)   Resp 20   Ht 5\' 5"  (1.651 m)   Wt 52.6 kg   SpO2 97%   BMI 19.30 kg/m   Physical Exam Vitals signs and nursing note  reviewed.  Constitutional:      General: She is not in acute distress.    Appearance: She is well-developed. She is not ill-appearing.  HENT:     Head: Normocephalic and atraumatic.     Mouth/Throat:     Mouth: Mucous membranes are moist.     Pharynx: No oropharyngeal exudate.  Eyes:     Extraocular Movements:  Extraocular movements intact.     Conjunctiva/sclera: Conjunctivae normal.     Pupils: Pupils are equal, round, and reactive to light.  Neck:     Musculoskeletal: Neck supple.  Cardiovascular:     Rate and Rhythm: Normal rate and regular rhythm.     Heart sounds: Normal heart sounds. No murmur.  Pulmonary:     Effort: Pulmonary effort is normal. No respiratory distress.     Breath sounds: Normal breath sounds.  Abdominal:     General: There is distension.     Palpations: Abdomen is soft.     Tenderness: There is no abdominal tenderness. There is no right CVA tenderness, left CVA tenderness, guarding or rebound. Negative signs include Murphy's sign, Rovsing's sign, McBurney's sign and psoas sign.  Skin:    General: Skin is warm and dry.     Capillary Refill: Capillary refill takes less than 2 seconds.  Neurological:     General: No focal deficit present.     Mental Status: She is alert.      ED Treatments / Results  Labs (all labs ordered are listed, but only abnormal results are displayed) Labs Reviewed  CBC WITH DIFFERENTIAL/PLATELET - Abnormal; Notable for the following components:      Result Value   Platelets 459 (*)    Neutro Abs 7.8 (*)    All other components within normal limits  COMPREHENSIVE METABOLIC PANEL - Abnormal; Notable for the following components:   Sodium 130 (*)    Chloride 94 (*)    CO2 21 (*)    Calcium 8.8 (*)    All other components within normal limits  LIPASE, BLOOD  CA 125    EKG None  Radiology Ct Abdomen Pelvis W Contrast  Result Date: 11/23/2018 CLINICAL DATA:  Abdominal pain and distention for 3 days. EXAM: CT ABDOMEN  AND PELVIS WITH CONTRAST TECHNIQUE: Multidetector CT imaging of the abdomen and pelvis was performed using the standard protocol following bolus administration of intravenous contrast. CONTRAST:  168mL OMNIPAQUE IOHEXOL 300 MG/ML  SOLN COMPARISON:  CT scan 11/23/2012 FINDINGS: Lower chest: The lung bases are clear of acute process. No pleural effusion or pulmonary lesions. The heart is normal in size. No pericardial effusion. The distal esophagus and aorta are unremarkable. Hepatobiliary: The liver contour is slightly irregular but caudate lobe is not enlarged and the hepatic fissures are not dilated. Do not see any gross portal venous collaterals and spleen is not enlarged. No focal hepatic lesions or intrahepatic biliary dilatation. The gallbladder is grossly normal. There is significant peri hepatic ascites. Pancreas: No mass, inflammation or ductal dilatation. Spleen: Normal size.  No focal lesions. Adrenals/Urinary Tract: The adrenal glands and kidneys are unremarkable. Stomach/Bowel: The stomach, duodenum, small bowel and colon are grossly normal. The small bowel and colon are surrounded by fluid. Vascular/Lymphatic: The aorta demonstrates moderate atherosclerotic calcifications. No aneurysm or dissection. The branch vessels are patent. The major venous structures are patent. Scattered small mesenteric and retroperitoneal lymph nodes. Vague ill-defined enhancing appearing peritoneal lesions in the mesentery worrisome for peritoneal carcinomatosis. Reproductive: The uterus and left ovary appear normal. Ill-defined irregular enhancing soft tissue noted in the region of the right adnexa but I do not see a discrete mass. Other: Large volume abdominal/pelvic ascites Musculoskeletal: No significant bony findings. IMPRESSION: 1. Findings highly suspicious for peritoneal carcinomatosis. Recommend paracentesis for therapeutic and diagnostic purposes. I do not see an obvious primary lesion but there is some irregular  enhancing soft tissue in the  right adnexal area. CA 125 level may be helpful. 2. Mild surface irregularity involving the liver but I do not see any obvious changes of cirrhosis. No hepatic lesions. Electronically Signed   By: Marijo Sanes M.D.   On: 11/23/2018 17:54    Procedures Procedures (including critical care time)  Medications Ordered in ED Medications  iohexol (OMNIPAQUE) 300 MG/ML solution 100 mL (100 mLs Intravenous Contrast Given 11/23/18 1719)     Initial Impression / Assessment and Plan / ED Course  I have reviewed the triage vital signs and the nursing notes.  Pertinent labs & imaging results that were available during my care of the patient were reviewed by me and considered in my medical decision making (see chart for details).     [name] [name] is a 68 year old female with history of breast cancer in remission who presents the ED with abdominal distention.  This has been ongoing for the last several weeks.  Patient with normal vitals.  No fever.  Patient was sent here to rule out bowel obstruction.  Patient has some distention of his abdomen on exam but no real focal tenderness.  Will evaluate for urinary tract infection, bowel obstruction, other intra-abdominal process such as appendicitis.   Lab work was overall unremarkable.  However, CT scan showing signs concerning for peritoneal carcinoma, likely in the setting of right ovarian mass.  Talked with gynecology on-call and they recommend getting a Ca125.  They will arrange for outpatient follow-up with gynecology oncology.  Patient is in no respiratory distress.  Overall she is well-appearing and comfortable.  Talk to her about her CT findings.  She understands follow-up with gynecology oncology.  Given return precautions and discharged in ED in good condition.  This chart was dictated using voice recognition software.  Despite best efforts to proofread,  errors can occur which can change the documentation meaning.     Final Clinical Impressions(s) / ED Diagnoses   Final diagnoses:  Abdominal pain, unspecified abdominal location  Ovarian mass    ED Discharge Orders    None       Lennice Sites, DO 11/23/18 1845

## 2018-11-23 NOTE — ED Notes (Signed)
ED Provider at bedside. 

## 2018-11-23 NOTE — ED Notes (Signed)
Family at bedside. 

## 2018-11-23 NOTE — ED Notes (Signed)
Verified with lab to do add on for CA 125

## 2018-11-24 LAB — CA 125: Cancer Antigen (CA) 125: 253 U/mL — ABNORMAL HIGH (ref 0.0–38.1)

## 2018-11-25 ENCOUNTER — Inpatient Hospital Stay: Payer: Medicare Other | Attending: Gynecologic Oncology | Admitting: Gynecologic Oncology

## 2018-11-25 ENCOUNTER — Other Ambulatory Visit: Payer: Self-pay

## 2018-11-25 ENCOUNTER — Ambulatory Visit (HOSPITAL_COMMUNITY)
Admission: RE | Admit: 2018-11-25 | Discharge: 2018-11-25 | Disposition: A | Discharge status: Home / Self Care | Payer: Medicare Other | Source: Ambulatory Visit | Attending: Gynecologic Oncology | Admitting: Gynecologic Oncology

## 2018-11-25 VITALS — BP 145/74 | HR 123 | Temp 98.1°F | Resp 20

## 2018-11-25 DIAGNOSIS — K219 Gastro-esophageal reflux disease without esophagitis: Secondary | ICD-10-CM | POA: Insufficient documentation

## 2018-11-25 DIAGNOSIS — R180 Malignant ascites: Secondary | ICD-10-CM | POA: Diagnosis not present

## 2018-11-25 DIAGNOSIS — R011 Cardiac murmur, unspecified: Secondary | ICD-10-CM | POA: Insufficient documentation

## 2018-11-25 DIAGNOSIS — K589 Irritable bowel syndrome without diarrhea: Secondary | ICD-10-CM | POA: Insufficient documentation

## 2018-11-25 DIAGNOSIS — Z79899 Other long term (current) drug therapy: Secondary | ICD-10-CM | POA: Diagnosis not present

## 2018-11-25 DIAGNOSIS — C801 Malignant (primary) neoplasm, unspecified: Secondary | ICD-10-CM | POA: Diagnosis not present

## 2018-11-25 DIAGNOSIS — I493 Ventricular premature depolarization: Secondary | ICD-10-CM | POA: Insufficient documentation

## 2018-11-25 DIAGNOSIS — Z7689 Persons encountering health services in other specified circumstances: Secondary | ICD-10-CM | POA: Diagnosis not present

## 2018-11-25 DIAGNOSIS — Z800 Family history of malignant neoplasm of digestive organs: Secondary | ICD-10-CM | POA: Diagnosis not present

## 2018-11-25 DIAGNOSIS — Z808 Family history of malignant neoplasm of other organs or systems: Secondary | ICD-10-CM | POA: Insufficient documentation

## 2018-11-25 DIAGNOSIS — I447 Left bundle-branch block, unspecified: Secondary | ICD-10-CM | POA: Diagnosis not present

## 2018-11-25 DIAGNOSIS — N879 Dysplasia of cervix uteri, unspecified: Secondary | ICD-10-CM | POA: Diagnosis not present

## 2018-11-25 DIAGNOSIS — R110 Nausea: Secondary | ICD-10-CM | POA: Insufficient documentation

## 2018-11-25 DIAGNOSIS — Z8041 Family history of malignant neoplasm of ovary: Secondary | ICD-10-CM | POA: Insufficient documentation

## 2018-11-25 DIAGNOSIS — C786 Secondary malignant neoplasm of retroperitoneum and peritoneum: Secondary | ICD-10-CM | POA: Insufficient documentation

## 2018-11-25 DIAGNOSIS — R188 Other ascites: Secondary | ICD-10-CM | POA: Diagnosis not present

## 2018-11-25 DIAGNOSIS — M797 Fibromyalgia: Secondary | ICD-10-CM | POA: Diagnosis not present

## 2018-11-25 DIAGNOSIS — L719 Rosacea, unspecified: Secondary | ICD-10-CM | POA: Insufficient documentation

## 2018-11-25 DIAGNOSIS — D398 Neoplasm of uncertain behavior of other specified female genital organs: Secondary | ICD-10-CM

## 2018-11-25 DIAGNOSIS — Z23 Encounter for immunization: Secondary | ICD-10-CM | POA: Insufficient documentation

## 2018-11-25 DIAGNOSIS — R1110 Vomiting, unspecified: Secondary | ICD-10-CM | POA: Insufficient documentation

## 2018-11-25 DIAGNOSIS — R971 Elevated cancer antigen 125 [CA 125]: Secondary | ICD-10-CM

## 2018-11-25 DIAGNOSIS — Z923 Personal history of irradiation: Secondary | ICD-10-CM | POA: Insufficient documentation

## 2018-11-25 DIAGNOSIS — M810 Age-related osteoporosis without current pathological fracture: Secondary | ICD-10-CM | POA: Insufficient documentation

## 2018-11-25 DIAGNOSIS — I491 Atrial premature depolarization: Secondary | ICD-10-CM | POA: Insufficient documentation

## 2018-11-25 DIAGNOSIS — M542 Cervicalgia: Secondary | ICD-10-CM | POA: Insufficient documentation

## 2018-11-25 DIAGNOSIS — Z853 Personal history of malignant neoplasm of breast: Secondary | ICD-10-CM | POA: Insufficient documentation

## 2018-11-25 DIAGNOSIS — Z5111 Encounter for antineoplastic chemotherapy: Secondary | ICD-10-CM | POA: Diagnosis not present

## 2018-11-25 DIAGNOSIS — C482 Malignant neoplasm of peritoneum, unspecified: Secondary | ICD-10-CM | POA: Insufficient documentation

## 2018-11-25 DIAGNOSIS — C800 Disseminated malignant neoplasm, unspecified: Secondary | ICD-10-CM

## 2018-11-25 MED ORDER — LIDOCAINE HCL 1 % IJ SOLN
INTRAMUSCULAR | Status: AC
  Filled 2018-11-25: qty 10

## 2018-11-25 NOTE — Procedures (Signed)
PROCEDURE SUMMARY:  Successful image-guided paracentesis from the right lateral abdomen.  Yielded 3.4 liters of hazy amber fluid.  No immediate complications.  EBL = 0 mL. Patient tolerated well.   Specimen was sent for labs.  Please see imaging section of Epic for full dictation.   Earley Abide PA-C 11/25/2018 3:51 PM

## 2018-11-25 NOTE — Progress Notes (Signed)
Consult Note: Gyn-Onc  Consult was requested by Dr. Rolanda Jay the evaluation of [name] [name] 68 y.o. female  CC: Ascites, pelvic mass  Assessment/Plan:  [name] is a 68 y.o. 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  Recent screening mammography and colonoscopy have been negative.  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  A ultrasound-guided paracentesis has been ordered to assess the source of the primary.  Given the presentation and physical examination findings, if a GYN malignancy is confirmed I recommend adjuvant chemotherapy with interval debulking followed by additional chemotherapy.  Referral to Dr. Lottie Rater for neoadjuvant chemotherapy Referral for genetic counselling  Advised to increase hydration after paracentesis.   HPI: [name] is a 68 y.o.   nulliparous female who reports menopause in her late 72s.  She reports a 6-year history of fibromyalgia.  He was in her usual state of health until several months ago when she noticed increased abdominal girth and some dyspnea upon exertion.  However since Friday, September 4 she has noted poor appetite and a rather significant increase in the abdominal distention.  She reports heartburn and intermittent nausea.  There is been no rectal or vaginal bleeding but she does report intermittent diarrhea.  [name] [name] was evaluated  in the ED for the complaint of abdominal distention on November 23, 2018.  A CT scan was notable for a slightly irregular liver contour.  Significant perihepatic ascites.  There was vague ill-defined enhancing appearing peritoneal lesions in the mesentery worrisome for  peritoneal carcinomatosis.  The uterus and left ovary appeared normal but there was an ill-defined irregular enhancing soft tissue noted in the region of the right adnexa without discrete masses.  Large volume abdominal pelvic ascites was noted.  Ca1 25 was collected and returned at a value of 253.  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  Her most recent mammogram was in June 2020 and was  within normal limits, her  last colonoscopy was in October 2014 and also was  within normal limits  [name] [name]'s  family history is notable for a sister diagnosed with multiple primaries ovarian cancer the age of 49 and vulva melanoma at the age of 50.  A second sister was diagnosed with melanoma in situ.  A paternal uncle was diagnosed with multiple primaries namely pancreatic cancer multiple myeloma and melanoma.   Review of Systems: Constitutional  Feels well,  Cardiovascular  No chest pain, shortness of breath, or edema  Pulmonary  No cough or wheeze.  Reports shallow breaths because of abdominal distention sleeps on one pillow Gastro Intestinal  [name] [name] nausea, vomitting, remittent diarrhoea. No bright red blood per rectum, distention that causes abdominal pain, no change in bowel movement, or constipation.  Genito Urinary  No frequency, urgency, dysuria, no vaginal bleeding musculo Skeletal  Intermittent myalgia, arthralgia, joint swelling and pain with flareups of fibromyalgia neurologic  No weakness, numbness, change in gait,  Psychology  No depression, anxiety, insomnia.    Current Meds:  Outpatient Encounter Medications as of 11/25/2018  Medication Sig  . magnesium oxide (MAG-OX)  400 MG tablet Take 400 mg by mouth daily.  Marland Kitchen UNABLE TO FIND Med Name: finaccea gel  . Azelaic Acid (FINACEA EX) Apply topically daily.    . Calcium  Carbonate-Vitamin D (CALTRATE 600+D) 600-400 MG-UNIT per tablet Take 1 tablet by mouth daily.   . cromolyn (NASALCROM) 5.2 MG/ACT nasal spray Place 1 spray into the nose as needed.   . gabapentin (NEURONTIN) 300 MG capsule Take 600 mg by mouth 3 (three) times daily.   . mupirocin ointment (BACTROBAN) 2 % Apply 1 application topically as needed.  . Risedronate Sodium 35 MG TBEC Take 1 tablet (35 mg total) by mouth once a week.  . Sulfacetamide Sodium 10 % CREA Apply topically at bedtime.     No facility-administered encounter medications on file as of 11/25/2018.     Allergy:  Allergies  Allergen Reactions  . Oxybutynin Chloride Other (See Comments)    Ditropan  Pt doesn't remember   . Thimerosal Other (See Comments)    Pt doesn't remember  . Tramadol Other (See Comments)    Pt doesn't remember  . Avelox [Moxifloxacin Hcl In Nacl] Rash  . Doxycycline Rash  . Erythromycin Rash  . Metronidazole Rash  . Minocycline Rash  . Penicillins Rash  . Quinolones Rash  . Sulfonamide Derivatives Rash    Social Hx:   Social History   Socioeconomic History  . Marital status: Married    Spouse name: Not on file  . Number of children: Not on file  . Years of education: Not on file  . Highest education level: Not on file  Occupational History  . Occupation: Teacher, early years/pre: [employer]  . Financial resource strain: Not on file  . Food insecurity    Worry: Not on file    Inability: Not on file  . Transportation needs    Medical: Not on file    Non-medical: Not on file  Tobacco Use  . Smoking status: Never Smoker  . Smokeless tobacco: Never Used  Substance and Sexual Activity  . Alcohol use: Yes    Alcohol/week: 2.0 standard drinks    Types: 2 Standard drinks or equivalent per week    Comment: Social   . Drug use: No  . Sexual activity: Not Currently    Birth control/protection: Post-menopausal    Comment: 1st intercourse 68 yo- More than 5  partners  Lifestyle  . Physical activity    Days per week: Not on file    Minutes per session: Not on file  . Stress: Not on file  Relationships  . Social Herbalist on phone: Not on file    Gets together: Not on file    Attends religious service: Not on file    Active member of club or organization: Not on file    Attends meetings of clubs or organizations: Not on file    Relationship status: Not on file  . Intimate partner violence    Fear of current or ex partner: Not on file    Emotionally abused: Not on file    Physically abused: Not on file    Forced sexual activity: Not on file  Other Topics Concern  . Not on file  Social History Narrative  . Not on file    Past Surgical Hx:  Past Surgical History:  Procedure Laterality Date  . BREAST LUMPECTOMY  1998   right breast with radiation  . CERVICAL DISCECTOMY  C3-5 with bone graft  . GYNECOLOGIC CRYOSURGERY  1980  . NECK SURGERY  [phone]   bone graft and fusion  . REFRACTIVE SURGERY     narrow angle glucoma  . RHINOPLASTY  1976  . TUBAL LIGATION  1977    Past Medical Hx:  Past Medical History:  Diagnosis Date  . Allergic rhinitis   . Cervical dysplasia 1980  . Cervicalgia   . Eczema    Rosacea,dermatitis-Dr [employer]  . Esophageal reflux   . Fibromyalgia   . GERD (gastroesophageal reflux disease)   . Glaucoma, narrow-angle   . Heart murmur   . Hiatal hernia   . Irritable bowel syndrome   . LBBB (left bundle branch block)   . Left bundle branch block   . 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 Mitral valve disorders(424.0)    No antibiotics required  . Osteoporosis 12/2016   T score -2.3, 2014 T score -2.5 AP spine  . PAC (premature atrial contraction) 2012   ,PVC's, and nonsustained atril tachycardia w aberration by heart monitor   . PVC (premature ventricular contraction)   . Stricture and  stenosis of esophagus     Past Gynecological History: Menarche at age 31 menopause late 35s nulliparous bilateral tubal ligation in her 76s.  History of abnormal Pap test treated with cryotherapy negative Pap test since  Family Hx:  Family History  Problem Relation Age of Onset  . Heart disease Mother   . Depression Mother   . Hypertension Mother   . Heart disease Father   . Hypertension Father   . Aneurysm Father   . Uterine cancer Sister   . Hypertension Sister   . Ovarian cancer Sister   . Melanoma Sister   . Heart disease Sister   . Alcohol abuse Sister   . Depression Sister   . Melanoma Sister   . Heart disease Paternal Grandfather   . Stroke Maternal Grandmother   . Cancer Maternal Grandfather        Pancreatic  . Stroke Paternal Grandmother   . Colon cancer Neg Hx   Sister #1 ovarian cancer diagnosed at age 31 vulvar melanoma diagnosed at age 75 Sister #2 melanoma in situ Sister #3 cervical cancer Paternal uncle pancreatic cancer multiple myeloma and melanoma  Vitals:  Blood pressure (!) 145/74, pulse (!) 123, temperature 98.1 F (36.7 C), temperature source Oral, resp. rate 20, SpO2 98 %.  Physical Exam: WD in NAD Neck  Supple NROM, without any enlargements.  Lymph Node Survey No cervical supraclavicular or inguinal adenopathy Cardiovascular  Pulse normal rate, regularity and rhythm.  Lungs  Clear to auscultation bilaterally, breath sounds present at the bases .  Skin  No rash/lesions/breakdown  Psychiatry  Alert and oriented appropriate mood affect speech and reasoning. Abdomen  Normoactive bowel sounds, abdomen soft, non-tender significantly distended consistent with ascites.  Unable to palpate an omental cake Back No CVA tenderness Genito Urinary  Vulva/vagina: Normal external female genitalia.  No lesions. No discharge or bleeding.  Bladder/urethra:  No lesions or masses  Vagina: Atrophic no discharge no bleeding cervix: Normal nulliparous  appearing, no lesions.  Uterus: Mobile, small no parametrial involvement   Adnexa: Central nodularity fixed Rectal  Good tone, no masses left-sided nodularity palpated on the anterior rectal wall Extremities  No bilateral cyanosis, clubbing or edema.   Janie Morning, MD, PhD  11/25/2018, 3:28 PM

## 2018-11-26 ENCOUNTER — Telehealth: Payer: Self-pay | Admitting: Oncology

## 2018-11-26 NOTE — Telephone Encounter (Signed)
Called [name] [name] and advised her of appointment to see Dr. Alvy Bimler on 11/30/18 at 1:30 and genetics on 12/02/18 at 1 pm.  She verbalized understanding and agreement.

## 2018-11-26 NOTE — Telephone Encounter (Signed)
[name] [name] called and asked if she can hold off on the genetics appointment for now.  She also asked if a PET scan would be recommended.

## 2018-11-29 ENCOUNTER — Other Ambulatory Visit: Payer: Self-pay

## 2018-11-29 ENCOUNTER — Telehealth: Payer: Self-pay

## 2018-11-29 NOTE — Telephone Encounter (Signed)
Told [name] [name] that the pathology shows that the fluid has cancer cells in it. The specific type of cancer is pending some more specific staining. This will not effect the visit with Dr. Alvy Bimler tomorrow.  Dr. Alvy Bimler will review pathology and plan of treatment for her tomorrow at visit.

## 2018-11-30 ENCOUNTER — Other Ambulatory Visit: Payer: Self-pay

## 2018-11-30 ENCOUNTER — Inpatient Hospital Stay (HOSPITAL_BASED_OUTPATIENT_CLINIC_OR_DEPARTMENT_OTHER): Payer: Medicare Other | Admitting: Hematology and Oncology

## 2018-11-30 ENCOUNTER — Inpatient Hospital Stay: Payer: Medicare Other

## 2018-11-30 ENCOUNTER — Encounter: Payer: Self-pay | Admitting: Hematology and Oncology

## 2018-11-30 VITALS — BP 134/84 | HR 102 | Temp 98.5°F | Resp 18 | Ht 65.0 in | Wt 112.8 lb

## 2018-11-30 DIAGNOSIS — Z923 Personal history of irradiation: Secondary | ICD-10-CM

## 2018-11-30 DIAGNOSIS — Z7289 Other problems related to lifestyle: Secondary | ICD-10-CM

## 2018-11-30 DIAGNOSIS — C482 Malignant neoplasm of peritoneum, unspecified: Secondary | ICD-10-CM

## 2018-11-30 DIAGNOSIS — R180 Malignant ascites: Secondary | ICD-10-CM

## 2018-11-30 DIAGNOSIS — Z23 Encounter for immunization: Secondary | ICD-10-CM

## 2018-11-30 DIAGNOSIS — R971 Elevated cancer antigen 125 [CA 125]: Secondary | ICD-10-CM | POA: Diagnosis not present

## 2018-11-30 DIAGNOSIS — Z86000 Personal history of in-situ neoplasm of breast: Secondary | ICD-10-CM | POA: Diagnosis not present

## 2018-11-30 DIAGNOSIS — H4020X4 Unspecified primary angle-closure glaucoma, indeterminate stage: Secondary | ICD-10-CM

## 2018-11-30 DIAGNOSIS — D398 Neoplasm of uncertain behavior of other specified female genital organs: Secondary | ICD-10-CM | POA: Diagnosis not present

## 2018-11-30 DIAGNOSIS — C801 Malignant (primary) neoplasm, unspecified: Secondary | ICD-10-CM | POA: Diagnosis not present

## 2018-11-30 DIAGNOSIS — Z5111 Encounter for antineoplastic chemotherapy: Secondary | ICD-10-CM | POA: Diagnosis not present

## 2018-11-30 LAB — CMP (CANCER CENTER ONLY)
ALT: 22 U/L (ref 0–44)
AST: 34 U/L (ref 15–41)
Albumin: 3.1 g/dL — ABNORMAL LOW (ref 3.5–5.0)
Alkaline Phosphatase: 84 U/L (ref 38–126)
Anion gap: 10 (ref 5–15)
BUN: 11 mg/dL (ref 8–23)
CO2: 29 mmol/L (ref 22–32)
Calcium: 8.8 mg/dL — ABNORMAL LOW (ref 8.9–10.3)
Chloride: 101 mmol/L (ref 98–111)
Creatinine: 0.62 mg/dL (ref 0.44–1.00)
GFR, Est AFR Am: >60 mL/min (ref >60)
GFR, Estimated: >60 mL/min (ref >60)
Glucose, Bld: 90 mg/dL (ref 70–99)
Potassium: 3.9 mmol/L (ref 3.5–5.1)
Sodium: 140 mmol/L (ref 135–145)
Total Bilirubin: <0.2 mg/dL — ABNORMAL LOW (ref 0.3–1.2)
Total Protein: 6.6 g/dL (ref 6.5–8.1)

## 2018-11-30 LAB — CBC WITH DIFFERENTIAL (CANCER CENTER ONLY)
Abs Immature Granulocytes: 0.04 10*3/uL (ref 0.00–0.07)
Basophils Absolute: 0.1 10*3/uL (ref 0.0–0.1)
Basophils Relative: 1 %
Eosinophils Absolute: 0.1 10*3/uL (ref 0.0–0.5)
Eosinophils Relative: 1 %
HCT: 35.3 % — ABNORMAL LOW (ref 36.0–46.0)
Hemoglobin: 11.3 g/dL — ABNORMAL LOW (ref 12.0–15.0)
Immature Granulocytes: 1 %
Lymphocytes Relative: 16 %
Lymphs Abs: 1.4 10*3/uL (ref 0.7–4.0)
MCH: 28.8 pg (ref 26.0–34.0)
MCHC: 32 g/dL (ref 30.0–36.0)
MCV: 90.1 fL (ref 80.0–100.0)
Monocytes Absolute: 0.7 10*3/uL (ref 0.1–1.0)
Monocytes Relative: 7 %
Neutro Abs: 6.7 10*3/uL (ref 1.7–7.7)
Neutrophils Relative %: 74 %
Platelet Count: 580 10*3/uL — ABNORMAL HIGH (ref 150–400)
RBC: 3.92 MIL/uL (ref 3.87–5.11)
RDW: 14.2 % (ref 11.5–15.5)
WBC Count: 8.9 10*3/uL (ref 4.0–10.5)
nRBC: 0 % (ref 0.0–0.2)

## 2018-11-30 MED ORDER — PROCHLORPERAZINE MALEATE 10 MG PO TABS: 10.0000 mg | ORAL_TABLET | Freq: Four times a day (QID) | ORAL | 1 refills | Status: DC | PRN

## 2018-11-30 MED ORDER — LIDOCAINE-PRILOCAINE 2.5-2.5 % EX CREA: TOPICAL_CREAM | CUTANEOUS | 3 refills | Status: DC

## 2018-11-30 MED ORDER — ONDANSETRON HCL 8 MG PO TABS: 8.0000 mg | ORAL_TABLET | Freq: Three times a day (TID) | ORAL | 1 refills | Status: DC | PRN

## 2018-11-30 MED ORDER — DEXAMETHASONE 4 MG PO TABS: ORAL_TABLET | ORAL | 6 refills | Status: DC

## 2018-11-30 MED ORDER — INFLUENZA VAC A&B SA ADJ QUAD 0.5 ML IM PRSY
PREFILLED_SYRINGE | INTRAMUSCULAR | Status: AC
  Filled 2018-11-30: qty 0.5

## 2018-11-30 MED ORDER — INFLUENZA VAC A&B SA ADJ QUAD 0.5 ML IM PRSY
0.5000 mL | PREFILLED_SYRINGE | Freq: Once | INTRAMUSCULAR | Status: AC
  Administered 2018-11-30: 0.5 mL via INTRAMUSCULAR

## 2018-11-30 MED FILL — LIDOCAINE-PRILOCAINE CREAM: 2.5-2.5 | 15 days supply | Qty: 30 | Fill #0

## 2018-11-30 MED FILL — DEXAMETHASONE 4 MG TABLET: 4 | 21 days supply | Qty: 4 | Fill #0

## 2018-11-30 MED FILL — ONDANSETRON HCL 8 MG TABLET: 8 | 10 days supply | Qty: 30 | Fill #0

## 2018-11-30 MED FILL — PROCHLORPERAZINE 10 MG TAB: 10 | 7 days supply | Qty: 30 | Fill #0

## 2018-11-30 NOTE — Progress Notes (Signed)
START ON PATHWAY REGIMEN - Ovarian     A cycle is every 21 days:     Paclitaxel      Carboplatin   **Always confirm dose/schedule in your pharmacy ordering system**  Patient Characteristics: Preoperative or Nonsurgical Candidate (Clinical Staging), Newly Diagnosed, Neoadjuvant Therapy followed by Surgery Therapeutic Status: Preoperative or Nonsurgical Candidate (Clinical Staging) BRCA Mutation Status: Awaiting Test Results AJCC T Category: cT3 AJCC 8 Stage Grouping: Unknown AJCC N Category: cN0 AJCC M Category: cM0 Therapy Plan: Neoadjuvant Therapy followed by Surgery Intent of Therapy: Curative Intent, Discussed with Patient 

## 2018-12-01 ENCOUNTER — Encounter: Payer: Self-pay | Admitting: Hematology and Oncology

## 2018-12-01 DIAGNOSIS — H4020X0 Unspecified primary angle-closure glaucoma, stage unspecified: Secondary | ICD-10-CM | POA: Insufficient documentation

## 2018-12-01 MED FILL — LIDOCAINE-PRILOCAINE CREAM: 2.5-2.5 | 15 days supply | Qty: 30 | Fill #0

## 2018-12-01 MED FILL — ONDANSETRON HCL 8 MG TABLET: 8 | 10 days supply | Qty: 30 | Fill #0

## 2018-12-01 NOTE — Assessment & Plan Note (Signed)
She has history of DCIS status post lumpectomy and radiation Her recent ultrasound and mammogram screening were negative

## 2018-12-01 NOTE — Assessment & Plan Note (Signed)
We reviewed the NCCN guidelines We discussed the role of chemotherapy. The intent is of curative intent.  We discussed some of the risks, benefits, side-effects of carboplatin & Taxol. Treatment is intravenous, every 3 weeks x 6 cycles  Some of the short term side-effects included, though not limited to, including weight loss, life threatening infections, risk of allergic reactions, need for transfusions of blood products, nausea, vomiting, change in bowel habits, loss of hair, admission to hospital for various reasons, and risks of death.   Long term side-effects are also discussed including risks of infertility, permanent damage to nerve function, hearing loss, chronic fatigue, kidney damage with possibility needing hemodialysis, and rare secondary malignancy including bone marrow disorders.  The patient is aware that the response rates discussed earlier is not guaranteed.  After a long discussion, patient made an informed decision to proceed with the prescribed plan of care.   Patient education material was dispensed. We discussed premedication with dexamethasone before chemotherapy. We discussed neoadjuvant chemotherapy approach for 3 cycles of treatment followed by imaging study If imaging study showed favorable response, she will undergo interval debulking surgery followed by 3 more cycles of adjuvant treatment I plan to order CT scan of the chest, abdomen and pelvis for staging after 3 cycles of treatment I have spoken with her ophthalmologist who felt that she can take steroid therapy We discussed port placement.  Unfortunately, due to timing issue, she will proceed with cycle 1 of treatment using peripheral veins She will attend chemo education class I do not plan prophylactic G-CSF support We discussed the role of genetic counseling and genetic testing.  I prefer to wait until her interval debulking surgery before consulting genetics We discussed the importance of preventive care and  reviewed the vaccination programs. She does not have any prior allergic reactions to influenza vaccination. She agrees to proceed with influenza vaccination today and we will administer it today at the clinic.

## 2018-12-01 NOTE — Assessment & Plan Note (Signed)
She has palpable ascites on exam I warned the patient about the risk and benefits of therapeutic paracentesis She will contact us if she needs paracentesis For now, I recommend observation only

## 2018-12-01 NOTE — Progress Notes (Signed)
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 The intent is of curative intent.  We discussed some of the risks, benefits, side-effects of carboplatin & Taxol. Treatment is intravenous, every 3 weeks x 6 cycles  Some of the short term side-effects included, though not limited to, including weight loss, life threatening infections, risk of allergic reactions, need for transfusions of blood products, nausea, vomiting, change in bowel habits, loss of hair, admission to hospital for various reasons, and risks of death.   Long term side-effects are also discussed including risks of infertility, permanent damage to nerve function, hearing loss, chronic fatigue, kidney damage with possibility needing hemodialysis, and rare secondary malignancy including bone marrow disorders.  The patient is aware that the response rates discussed earlier is not guaranteed.  After a long discussion, patient made an informed decision to proceed with the prescribed plan of care.   Patient education material was dispensed. We discussed premedication with dexamethasone before chemotherapy. We discussed neoadjuvant chemotherapy approach for 3 cycles of treatment followed by imaging study If imaging study showed favorable response, she will undergo interval debulking surgery followed by 3 more cycles of adjuvant treatment I plan to order CT scan of the chest, abdomen and pelvis for staging after 3 cycles of treatment I have spoken with her ophthalmologist who felt that she can take steroid therapy We discussed port placement.  Unfortunately, due to timing issue, she will proceed with cycle 1 of treatment using peripheral veins She will attend chemo education class I do not plan prophylactic G-CSF support We discussed the role of  genetic counseling and genetic testing.  I prefer to wait until her interval debulking surgery before consulting genetics We discussed the importance of preventive care and reviewed the vaccination programs. She does not have any prior allergic reactions to influenza vaccination. She agrees to proceed with influenza vaccination today and we will administer it today at the clinic.   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 IR IMAGING GUIDED PORT INSERTION    Standing Status:   Future    Standing Expiration Date:   01/30/2020    Order Specific Question:   Reason for Exam (SYMPTOM  OR DIAGNOSIS REQUIRED)    Answer:   need power port for chemo    Order Specific Question:   Preferred Imaging Location?    Answer:   [facility]  . CBC with Differential ([place] Only)    Standing Status:   Standing    Number of Occurrences:   20    Standing Expiration Date:   11/30/2019  . CMP ([place] only)    Standing Status:   Standing    Number of Occurrences:   20    Standing Expiration Date:   11/30/2019  .  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 female is here because of recent diagnosis of cancer. Her husband, [name] [name] is also present The patient had prior history of DCIS of the breast diagnosed at 44 treated successfully with lumpectomy and radiation with no signs of recurrence Her symptoms started with abdominal distention, fatigue and feeling unwell.  She felt bloated but denies nausea or changes in bowel habits No recent vaginal bleeding. She went to the emergency department and underwent imaging study and subsequently referred to GYN oncology for evaluation and biopsy  I have reviewed her chart and materials related to her cancer extensively and collaborated history with the patient. Summary of oncologic history is as follows: Oncology History  Peritoneal carcinoma ([place])  11/23/2018 Imaging   Ct abdomen and pelvis 1. Findings highly suspicious for peritoneal carcinomatosis. Recommend paracentesis for therapeutic and diagnostic purposes. I do not see an obvious primary lesion but there is some irregular enhancing soft tissue in the right adnexal area. CA 125 level may be helpful. 2. Mild surface irregularity involving the liver but I do not see any obvious changes of cirrhosis. No hepatic lesions.   11/23/2018 Tumor Marker   Patient's tumor was tested for the following markers: CA-125 Results of the tumor marker test revealed 253.   11/25/2018 Initial Diagnosis   Peritoneal carcinoma ([place])   11/25/2018 Imaging   US paracentesis Successful ultrasound-guided paracentesis yielding 3.4 L of peritoneal fluid.   12/01/2018 Cancer Staging   Staging form: Ovary, Fallopian Tube, and Primary Peritoneal Carcinoma, AJCC 8th Edition - Clinical stage from 12/01/2018: cT3, cN0, cM0 - Signed by [name], MD on 12/01/2018     MEDICAL HISTORY:  Past Medical History:  Diagnosis Date  . Allergic rhinitis   . Cervical dysplasia 1980  . Cervicalgia   . Eczema    Rosacea,dermatitis-Dr [employer]   . Esophageal reflux   . Fibromyalgia   . GERD (gastroesophageal reflux disease)   . Glaucoma, narrow-angle   . Heart murmur   . Hiatal hernia   . Irritable bowel syndrome   . LBBB (left bundle branch block)   . Left bundle branch block   . 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 Mitral valve disorders(424.0)    No antibiotics required  . Osteoporosis 12/2016   T score -2.3, 2014 T score -2.5 AP spine  . PAC (premature atrial contraction) 2012   ,PVC's, and nonsustained atril tachycardia w aberration by heart monitor   . PVC (premature ventricular contraction)   . Stricture and stenosis of esophagus     SURGICAL HISTORY: Past Surgical History:  Procedure Laterality Date  . BREAST LUMPECTOMY  1998   right breast with radiation  . CERVICAL DISCECTOMY     C3-5 with bone graft  . GYNECOLOGIC CRYOSURGERY  1980  . NECK SURGERY  [phone]   bone graft and fusion  . REFRACTIVE SURGERY     narrow angle glucoma  . RHINOPLASTY  1976  . TUBAL LIGATION  1977    SOCIAL HISTORY: Social History   Socioeconomic History  .  Marital status: Married    Spouse name: Antony Haste  . Number of children: 0  . Years of education: Not on file  . Highest education level: Not on file  Occupational History  . Occupation: Teacher, early years/pre: [place]  . Financial resource strain: Not on file  . Food insecurity    Worry: Not on file    Inability: Not on file  . Transportation needs    Medical: Not on file    Non-medical: Not on file  Tobacco Use  . Smoking status: Never Smoker  . Smokeless tobacco: Never Used  Substance and Sexual Activity  . Alcohol use: Yes    Alcohol/week: 2.0 standard drinks    Types: 2 Standard drinks or equivalent per week    Comment: Social   . Drug use: No  . Sexual activity: Not Currently    Birth control/protection: Post-menopausal     Comment: 1st intercourse 68 yo- More than 5 partners  Lifestyle  . Physical activity    Days per week: Not on file    Minutes per session: Not on file  . Stress: Not on file  Relationships  . Social Herbalist on phone: Not on file    Gets together: Not on file    Attends religious service: Not on file    Active member of club or organization: Not on file    Attends meetings of clubs or organizations: Not on file    Relationship status: Not on file  . Intimate partner violence    Fear of current or ex partner: Not on file    Emotionally abused: Not on file    Physically abused: Not on file    Forced sexual activity: Not on file  Other Topics Concern  . Not on file  Social History Narrative  . Not on file    FAMILY HISTORY: Family History  Problem Relation Age of Onset  . Heart disease Mother   . Depression Mother   . Hypertension Mother   . Heart disease Father   . Hypertension Father   . Aneurysm Father   . Uterine cancer Sister   . Hypertension Sister   . Ovarian cancer Sister 34  . Melanoma Sister   . Heart disease Sister   . Alcohol abuse Sister   . Depression Sister   . Melanoma Sister   . Heart disease Paternal Grandfather   . Stroke Maternal Grandmother   . Cancer Maternal Grandfather        Pancreatic  . Stroke Paternal Grandmother   . Cancer Paternal Uncle        multiple myeloma  . Cancer Paternal Uncle        prostate cancer  . Cancer Paternal Uncle        melanoma  . Colon cancer Neg Hx     ALLERGIES:  is allergic to oxybutynin chloride; thimerosal; tramadol; avelox [moxifloxacin hcl in nacl]; doxycycline; erythromycin; metronidazole; minocycline; penicillins; quinolones; and sulfonamide derivatives.  MEDICATIONS:  Current Outpatient Medications  Medication Sig Dispense Refill  . magnesium oxide (MAG-OX) 400 MG tablet Take 400 mg by mouth daily as needed.    . meloxicam (MOBIC) 7.5 MG tablet Take 7.5 mg by mouth 2 (two) times daily  as needed.    . Azelaic Acid (FINACEA EX) Apply topically daily.      . Calcium Carbonate-Vitamin D (CALTRATE 600+D) 600-400 MG-UNIT per tablet Take 1 tablet by mouth daily.     Marland Kitchen  cromolyn (NASALCROM) 5.2 MG/ACT nasal spray Place 1 spray into the nose as needed.     Marland Kitchen dexamethasone (DECADRON) 4 MG tablet Take 2 tabs at the night before and 2 tab the morning of chemotherapy, every 3 weeks, by mouth 4 tablet 6  . gabapentin (NEURONTIN) 300 MG capsule Take 600 mg by mouth 3 (three) times daily.     Marland Kitchen lidocaine-prilocaine (EMLA) cream Apply to affected area once 30 g 3  . mupirocin ointment (BACTROBAN) 2 % Apply 1 application topically as needed. 22 g 2  . ondansetron (ZOFRAN) 8 MG tablet Take 1 tablet (8 mg total) by mouth every 8 (eight) hours as needed for refractory nausea / vomiting. 30 tablet 1  . prochlorperazine (COMPAZINE) 10 MG tablet Take 1 tablet (10 mg total) by mouth every 6 (six) hours as needed (Nausea or vomiting). 30 tablet 1  . Risedronate Sodium 35 MG TBEC Take 1 tablet (35 mg total) by mouth once a week. 4 tablet 11  . Sulfacetamide Sodium 10 % CREA Apply topically at bedtime.       No current facility-administered medications for this visit.     REVIEW OF SYSTEMS:   Constitutional: Denies fevers, chills or abnormal night sweats Eyes: Denies blurriness of vision, double vision or watery eyes Ears, nose, mouth, throat, and face: Denies mucositis or sore throat Respiratory: Denies cough, dyspnea or wheezes Cardiovascular: Denies palpitation, chest discomfort or lower extremity swelling Skin: Denies abnormal skin rashes Lymphatics: Denies new lymphadenopathy or easy bruising Neurological:Denies numbness, tingling or new weaknesses Behavioral/Psych: Mood is stable, no new changes  All other systems were reviewed with the patient and are negative.  PHYSICAL EXAMINATION: ECOG PERFORMANCE STATUS: 1 - Symptomatic but completely ambulatory  Vitals:   11/30/18 1314  BP: 134/84   Pulse: (!) 102  Resp: 18  Temp: 98.5 F (36.9 C)  SpO2: 100%   Filed Weights   11/30/18 1314  Weight: 112 lb 12.8 oz (51.2 kg)    GENERAL:alert, no distress and comfortable SKIN: skin color, texture, turgor are normal, no rashes or significant lesions EYES: normal, conjunctiva are pink and non-injected, sclera clear OROPHARYNX:no exudate, no erythema and lips, buccal mucosa, and tongue normal  NECK: supple, thyroid normal size, non-tender, without nodularity LYMPH:  no palpable lymphadenopathy in the cervical, axillary or inguinal LUNGS: clear to auscultation and percussion with normal breathing effort HEART: regular rate & rhythm and no murmurs and no lower extremity edema ABDOMEN:abdomen soft, abdomen appeared distended with ascites but she does not appear uncomfortable with exam Musculoskeletal:no cyanosis of digits and no clubbing  PSYCH: alert & oriented x 3 with fluent speech NEURO: no focal motor/sensory deficits  LABORATORY DATA:  I have reviewed the data as listed Lab Results  Component Value Date   WBC 8.9 11/30/2018   HGB 11.3 (L) 11/30/2018   HCT 35.3 (L) 11/30/2018   MCV 90.1 11/30/2018   PLT 580 (H) 11/30/2018   Recent Labs    11/23/18 1627 11/30/18 1508  NA 130* 140  K 3.5 3.9  CL 94* 101  CO2 21* 29  GLUCOSE 85 90  BUN 9 11  CREATININE 0.48 0.62  CALCIUM 8.8* 8.8*  GFRNONAA >60 >60  GFRAA >60 >60  PROT 7.1 6.6  ALBUMIN 3.6 3.1*  AST 34 34  ALT 23 22  ALKPHOS 79 84  BILITOT 0.6 <0.2*    RADIOGRAPHIC STUDIES: I have personally reviewed the radiological images as listed and agreed with the findings in the  report. Ct Abdomen Pelvis W Contrast  Result Date: 11/23/2018 CLINICAL DATA:  Abdominal pain and distention for 3 days. EXAM: CT ABDOMEN AND PELVIS WITH CONTRAST TECHNIQUE: Multidetector CT imaging of the abdomen and pelvis was performed using the standard protocol following bolus administration of intravenous contrast. CONTRAST:  192m  OMNIPAQUE IOHEXOL 300 MG/ML  SOLN COMPARISON:  CT scan 11/23/2012 FINDINGS: Lower chest: The lung bases are clear of acute process. No pleural effusion or pulmonary lesions. The heart is normal in size. No pericardial effusion. The distal esophagus and aorta are unremarkable. Hepatobiliary: The liver contour is slightly irregular but caudate lobe is not enlarged and the hepatic fissures are not dilated. Do not see any gross portal venous collaterals and spleen is not enlarged. No focal hepatic lesions or intrahepatic biliary dilatation. The gallbladder is grossly normal. There is significant peri hepatic ascites. Pancreas: No mass, inflammation or ductal dilatation. Spleen: Normal size.  No focal lesions. Adrenals/Urinary Tract: The adrenal glands and kidneys are unremarkable. Stomach/Bowel: The stomach, duodenum, small bowel and colon are grossly normal. The small bowel and colon are surrounded by fluid. Vascular/Lymphatic: The aorta demonstrates moderate atherosclerotic calcifications. No aneurysm or dissection. The branch vessels are patent. The major venous structures are patent. Scattered small mesenteric and retroperitoneal lymph nodes. Vague ill-defined enhancing appearing peritoneal lesions in the mesentery worrisome for peritoneal carcinomatosis. Reproductive: The uterus and left ovary appear normal. Ill-defined irregular enhancing soft tissue noted in the region of the right adnexa but I do not see a discrete mass. Other: Large volume abdominal/pelvic ascites Musculoskeletal: No significant bony findings. IMPRESSION: 1. Findings highly suspicious for peritoneal carcinomatosis. Recommend paracentesis for therapeutic and diagnostic purposes. I do not see an obvious primary lesion but there is some irregular enhancing soft tissue in the right adnexal area. CA 125 level may be helpful. 2. Mild surface irregularity involving the liver but I do not see any obvious changes of cirrhosis. No hepatic lesions.  Electronically Signed   By: PMarijo SanesM.D.   On: 11/23/2018 17:54   [place]  Result Date: 11/25/2018 INDICATION: Patient with history of breast cancer currently in remission, CT findings concerning for possible metastatic GYN primary (irregular enhancing soft tissue in the right adnexal area) and peritoneal carcinomatosis, elevated CA 125, abdominal distension, and new rapid onset of ascites. Request is made for diagnostic and therapeutic paracentesis. EXAM: ULTRASOUND GUIDED DIAGNOSTIC AND THERAPEUTIC PARACENTESIS MEDICATIONS: 10 mL 1% lidocaine COMPLICATIONS: None immediate. PROCEDURE: Informed written consent was obtained from the patient after a discussion of the risks, benefits and alternatives to treatment. A timeout was performed prior to the initiation of the procedure. Initial ultrasound scanning demonstrates a large amount of ascites within the right lower abdominal quadrant. The right lower abdomen was prepped and draped in the usual sterile fashion. 1% lidocaine was used for local anesthesia. Following this, a 19 gauge, 7-cm, Yueh catheter was introduced. An ultrasound image was saved for documentation purposes. The paracentesis was performed. The catheter was removed and a dressing was applied. The patient tolerated the procedure well without immediate post procedural complication. FINDINGS: A total of approximately 3.4 L of hazy amber fluid was removed. Samples were sent to the laboratory as requested by the clinical team. IMPRESSION: Successful ultrasound-guided paracentesis yielding 3.4 L of peritoneal fluid. Read by: AEarley Abide PA-C Electronically Signed   By: AMarkus DaftM.D.   On: 11/25/2018 16:32    I spent 90 minutes counseling the patient face to face. The total time spent  in the appointment was 95 minutes and more than 50% was on counseling.  All questions were answered. The patient knows to call the clinic with any problems, questions or concerns.  [name],  MD 12/01/2018 12:38 PM

## 2018-12-01 NOTE — Assessment & Plan Note (Signed)
This is surgically corrected However, per patient request, I have spoken with her ophthalmologist to inquire whether it is okay to proceed with steroid treatment and he agreed

## 2018-12-02 ENCOUNTER — Encounter: Payer: Medicare Other | Admitting: Licensed Clinical Social Worker

## 2018-12-02 ENCOUNTER — Other Ambulatory Visit: Payer: Medicare Other

## 2018-12-02 ENCOUNTER — Telehealth: Payer: Self-pay | Admitting: Hematology and Oncology

## 2018-12-02 NOTE — Telephone Encounter (Signed)
I talk with patient regarding schedule  

## 2018-12-03 LAB — CA 125: Cancer Antigen (CA) 125: 267 U/mL — ABNORMAL HIGH (ref 0.0–38.1)

## 2018-12-06 ENCOUNTER — Other Ambulatory Visit: Payer: Self-pay

## 2018-12-06 ENCOUNTER — Other Ambulatory Visit: Payer: Self-pay | Admitting: Hematology and Oncology

## 2018-12-06 ENCOUNTER — Inpatient Hospital Stay: Payer: Medicare Other

## 2018-12-06 VITALS — BP 119/61 | HR 81 | Temp 97.9°F | Resp 17 | Wt 112.5 lb

## 2018-12-06 DIAGNOSIS — Z5111 Encounter for antineoplastic chemotherapy: Secondary | ICD-10-CM | POA: Diagnosis not present

## 2018-12-06 DIAGNOSIS — C801 Malignant (primary) neoplasm, unspecified: Secondary | ICD-10-CM | POA: Diagnosis not present

## 2018-12-06 DIAGNOSIS — C482 Malignant neoplasm of peritoneum, unspecified: Secondary | ICD-10-CM

## 2018-12-06 DIAGNOSIS — D398 Neoplasm of uncertain behavior of other specified female genital organs: Secondary | ICD-10-CM | POA: Diagnosis not present

## 2018-12-06 DIAGNOSIS — Z23 Encounter for immunization: Secondary | ICD-10-CM | POA: Diagnosis not present

## 2018-12-06 DIAGNOSIS — R180 Malignant ascites: Secondary | ICD-10-CM

## 2018-12-06 DIAGNOSIS — R971 Elevated cancer antigen 125 [CA 125]: Secondary | ICD-10-CM | POA: Diagnosis not present

## 2018-12-06 MED ORDER — SODIUM CHLORIDE 0.9 % IV SOLN
Freq: Once | INTRAVENOUS | Status: AC
  Administered 2018-12-06: 09:00:00 via INTRAVENOUS
  Filled 2018-12-06: qty 5

## 2018-12-06 MED ORDER — SODIUM CHLORIDE 0.9 % IV SOLN
175.0000 mg/m2 | Freq: Once | INTRAVENOUS | Status: AC
  Administered 2018-12-06: 270 mg via INTRAVENOUS
  Filled 2018-12-06: qty 45

## 2018-12-06 MED ORDER — DIPHENHYDRAMINE HCL 50 MG/ML IJ SOLN
INTRAMUSCULAR | Status: AC
  Filled 2018-12-06: qty 1

## 2018-12-06 MED ORDER — DIPHENHYDRAMINE HCL 50 MG/ML IJ SOLN
25.0000 mg | Freq: Once | INTRAMUSCULAR | Status: AC
  Administered 2018-12-06: 25 mg via INTRAVENOUS

## 2018-12-06 MED ORDER — PALONOSETRON HCL INJECTION 0.25 MG/5ML
INTRAVENOUS | Status: AC
  Filled 2018-12-06: qty 5

## 2018-12-06 MED ORDER — PALONOSETRON HCL INJECTION 0.25 MG/5ML
0.2500 mg | Freq: Once | INTRAVENOUS | Status: AC
  Administered 2018-12-06: 0.25 mg via INTRAVENOUS

## 2018-12-06 MED ORDER — FAMOTIDINE IN NACL 20-0.9 MG/50ML-% IV SOLN
INTRAVENOUS | Status: AC
  Filled 2018-12-06: qty 50

## 2018-12-06 MED ORDER — FAMOTIDINE IN NACL 20-0.9 MG/50ML-% IV SOLN
20.0000 mg | Freq: Once | INTRAVENOUS | Status: AC
  Administered 2018-12-06: 20 mg via INTRAVENOUS

## 2018-12-06 MED ORDER — SODIUM CHLORIDE 0.9 % IV SOLN
400.0000 mg | Freq: Once | INTRAVENOUS | Status: AC
  Administered 2018-12-06: 400 mg via INTRAVENOUS
  Filled 2018-12-06: qty 40

## 2018-12-06 MED ORDER — SODIUM CHLORIDE 0.9 % IV SOLN
Freq: Once | INTRAVENOUS | Status: AC
  Administered 2018-12-06: 08:00:00 via INTRAVENOUS
  Filled 2018-12-06: qty 250

## 2018-12-06 NOTE — Progress Notes (Signed)
Dr. Alvy Bimler aware that patient is having mild abdominal distention with discomfort, affecting appetite, and she feels likes she needs another paracentesis. Okay to treat per Dr. Alvy Bimler and she will be ordering a paracentesis and patient aware.

## 2018-12-06 NOTE — Patient Instructions (Signed)
[facility] Discharge Instructions for Patients Receiving Chemotherapy  Today you received the following chemotherapy agents: Paclitaxel, Carboplatin  To help prevent nausea and vomiting after your treatment, we encourage you to take your nausea medication as directed.   If you develop nausea and vomiting that is not controlled by your nausea medication, call the clinic.   BELOW ARE SYMPTOMS THAT SHOULD BE REPORTED IMMEDIATELY:  *FEVER GREATER THAN 100.5 F  *CHILLS WITH OR WITHOUT FEVER  NAUSEA AND VOMITING THAT IS NOT CONTROLLED WITH YOUR NAUSEA MEDICATION  *UNUSUAL SHORTNESS OF BREATH  *UNUSUAL BRUISING OR BLEEDING  TENDERNESS IN MOUTH AND THROAT WITH OR WITHOUT PRESENCE OF ULCERS  *URINARY PROBLEMS  *BOWEL PROBLEMS  UNUSUAL RASH Items with * indicate a potential emergency and should be followed up as soon as possible.  Feel free to call the clinic should you have any questions or concerns. The clinic phone number is [phone].  Please show the CHEMO ALERT CARD at check-in to the Emergency Department and triage nurse.  Paclitaxel injection What is this medicine? PACLITAXEL (PAK li TAX el) is a chemotherapy drug. It targets fast dividing cells, like cancer cells, and causes these cells to die. This medicine is used to treat ovarian cancer, breast cancer, lung cancer, Kaposi's sarcoma, and other cancers. This medicine may be used for other purposes; ask your health care provider or pharmacist if you have questions. COMMON BRAND NAME(S): Onxol, Taxol What should I tell my health care provider before I take this medicine? They need to know if you have any of these conditions:  history of irregular heartbeat  liver disease  low blood counts, like low white cell, platelet, or red cell counts  lung or breathing disease, like asthma  tingling of the fingers or toes, or other nerve disorder  an unusual or allergic reaction to paclitaxel, alcohol,  polyoxyethylated castor oil, other chemotherapy, other medicines, foods, dyes, or preservatives  pregnant or trying to get pregnant  breast-feeding How should I use this medicine? This drug is given as an infusion into a vein. It is administered in a hospital or clinic by a specially trained health care professional. Talk to your pediatrician regarding the use of this medicine in children. Special care may be needed. Overdosage: If you think you have taken too much of this medicine contact a poison control center or emergency room at once. NOTE: This medicine is only for you. Do not share this medicine with others. What if I miss a dose? It is important not to miss your dose. Call your doctor or health care professional if you are unable to keep an appointment. What may interact with this medicine? Do not take this medicine with any of the following medications:  disulfiram  metronidazole This medicine may also interact with the following medications:  antiviral medicines for hepatitis, HIV or AIDS  certain antibiotics like erythromycin and clarithromycin  certain medicines for fungal infections like ketoconazole and itraconazole  certain medicines for seizures like carbamazepine, phenobarbital, phenytoin  gemfibrozil  nefazodone  rifampin  St. John's wort This list may not describe all possible interactions. Give your health care provider a list of all the medicines, herbs, non-prescription drugs, or dietary supplements you use. Also tell them if you smoke, drink alcohol, or use illegal drugs. Some items may interact with your medicine. What should I watch for while using this medicine? Your condition will be monitored carefully while you are receiving this medicine. You will need important blood   work done while you are taking this medicine. This medicine can cause serious allergic reactions. To reduce your risk you will need to take other medicine(s) before treatment with this  medicine. If you experience allergic reactions like skin rash, itching or hives, swelling of the face, lips, or tongue, tell your doctor or health care professional right away. In some cases, you may be given additional medicines to help with side effects. Follow all directions for their use. This drug may make you feel generally unwell. This is not uncommon, as chemotherapy can affect healthy cells as well as cancer cells. Report any side effects. Continue your course of treatment even though you feel ill unless your doctor tells you to stop. Call your doctor or health care professional for advice if you get a fever, chills or sore throat, or other symptoms of a cold or flu. Do not treat yourself. This drug decreases your body's ability to fight infections. Try to avoid being around people who are sick. This medicine may increase your risk to bruise or bleed. Call your doctor or health care professional if you notice any unusual bleeding. Be careful brushing and flossing your teeth or using a toothpick because you may get an infection or bleed more easily. If you have any dental work done, tell your dentist you are receiving this medicine. Avoid taking products that contain aspirin, acetaminophen, ibuprofen, naproxen, or ketoprofen unless instructed by your doctor. These medicines may hide a fever. Do not become pregnant while taking this medicine. Women should inform their doctor if they wish to become pregnant or think they might be pregnant. There is a potential for serious side effects to an unborn child. Talk to your health care professional or pharmacist for more information. Do not breast-feed an infant while taking this medicine. Men are advised not to father a child while receiving this medicine. This product may contain alcohol. Ask your pharmacist or healthcare provider if this medicine contains alcohol. Be sure to tell all healthcare providers you are taking this medicine. Certain medicines,  like metronidazole and disulfiram, can cause an unpleasant reaction when taken with alcohol. The reaction includes flushing, headache, nausea, vomiting, sweating, and increased thirst. The reaction can last from 30 minutes to several hours. What side effects may I notice from receiving this medicine? Side effects that you should report to your doctor or health care professional as soon as possible:  allergic reactions like skin rash, itching or hives, swelling of the face, lips, or tongue  breathing problems  changes in vision  fast, irregular heartbeat  high or low blood pressure  mouth sores  pain, tingling, numbness in the hands or feet  signs of decreased platelets or bleeding - bruising, pinpoint red spots on the skin, black, tarry stools, blood in the urine  signs of decreased red blood cells - unusually weak or tired, feeling faint or lightheaded, falls  signs of infection - fever or chills, cough, sore throat, pain or difficulty passing urine  signs and symptoms of liver injury like dark yellow or brown urine; general ill feeling or flu-like symptoms; light-colored stools; loss of appetite; nausea; right upper belly pain; unusually weak or tired; yellowing of the eyes or skin  swelling of the ankles, feet, hands  unusually slow heartbeat Side effects that usually do not require medical attention (report to your doctor or health care professional if they continue or are bothersome):  diarrhea  hair loss  loss of appetite  muscle or joint pain    nausea, vomiting  pain, redness, or irritation at site where injected  tiredness This list may not describe all possible side effects. Call your doctor for medical advice about side effects. You may report side effects to FDA at 1-800-FDA-1088. Where should I keep my medicine? This drug is given in a hospital or clinic and will not be stored at home. NOTE: This sheet is a summary. It may not cover all possible information.  If you have questions about this medicine, talk to your doctor, pharmacist, or health care provider.  2020 Elsevier/Gold Standard (2016-11-04 13:14:55)  Carboplatin injection What is this medicine? CARBOPLATIN (KAR boe pla tin) is a chemotherapy drug. It targets fast dividing cells, like cancer cells, and causes these cells to die. This medicine is used to treat ovarian cancer and many other cancers. This medicine may be used for other purposes; ask your health care provider or pharmacist if you have questions. COMMON BRAND NAME(S): Paraplatin What should I tell my health care provider before I take this medicine? They need to know if you have any of these conditions:  blood disorders  hearing problems  kidney disease  recent or ongoing radiation therapy  an unusual or allergic reaction to carboplatin, cisplatin, other chemotherapy, other medicines, foods, dyes, or preservatives  pregnant or trying to get pregnant  breast-feeding How should I use this medicine? This drug is usually given as an infusion into a vein. It is administered in a hospital or clinic by a specially trained health care professional. Talk to your pediatrician regarding the use of this medicine in children. Special care may be needed. Overdosage: If you think you have taken too much of this medicine contact a poison control center or emergency room at once. NOTE: This medicine is only for you. Do not share this medicine with others. What if I miss a dose? It is important not to miss a dose. Call your doctor or health care professional if you are unable to keep an appointment. What may interact with this medicine?  medicines for seizures  medicines to increase blood counts like filgrastim, pegfilgrastim, sargramostim  some antibiotics like amikacin, gentamicin, neomycin, streptomycin, tobramycin  vaccines Talk to your doctor or health care professional before taking any of these  medicines:  acetaminophen  aspirin  ibuprofen  ketoprofen  naproxen This list may not describe all possible interactions. Give your health care provider a list of all the medicines, herbs, non-prescription drugs, or dietary supplements you use. Also tell them if you smoke, drink alcohol, or use illegal drugs. Some items may interact with your medicine. What should I watch for while using this medicine? Your condition will be monitored carefully while you are receiving this medicine. You will need important blood work done while you are taking this medicine. This drug may make you feel generally unwell. This is not uncommon, as chemotherapy can affect healthy cells as well as cancer cells. Report any side effects. Continue your course of treatment even though you feel ill unless your doctor tells you to stop. In some cases, you may be given additional medicines to help with side effects. Follow all directions for their use. Call your doctor or health care professional for advice if you get a fever, chills or sore throat, or other symptoms of a cold or flu. Do not treat yourself. This drug decreases your body's ability to fight infections. Try to avoid being around people who are sick. This medicine may increase your risk to bruise or bleed.   Call your doctor or health care professional if you notice any unusual bleeding. Be careful brushing and flossing your teeth or using a toothpick because you may get an infection or bleed more easily. If you have any dental work done, tell your dentist you are receiving this medicine. Avoid taking products that contain aspirin, acetaminophen, ibuprofen, naproxen, or ketoprofen unless instructed by your doctor. These medicines may hide a fever. Do not become pregnant while taking this medicine. Women should inform their doctor if they wish to become pregnant or think they might be pregnant. There is a potential for serious side effects to an unborn child. Talk  to your health care professional or pharmacist for more information. Do not breast-feed an infant while taking this medicine. What side effects may I notice from receiving this medicine? Side effects that you should report to your doctor or health care professional as soon as possible:  allergic reactions like skin rash, itching or hives, swelling of the face, lips, or tongue  signs of infection - fever or chills, cough, sore throat, pain or difficulty passing urine  signs of decreased platelets or bleeding - bruising, pinpoint red spots on the skin, black, tarry stools, nosebleeds  signs of decreased red blood cells - unusually weak or tired, fainting spells, lightheadedness  breathing problems  changes in hearing  changes in vision  chest pain  high blood pressure  low blood counts - This drug may decrease the number of white blood cells, red blood cells and platelets. You may be at increased risk for infections and bleeding.  nausea and vomiting  pain, swelling, redness or irritation at the injection site  pain, tingling, numbness in the hands or feet  problems with balance, talking, walking  trouble passing urine or change in the amount of urine Side effects that usually do not require medical attention (report to your doctor or health care professional if they continue or are bothersome):  hair loss  loss of appetite  metallic taste in the mouth or changes in taste This list may not describe all possible side effects. Call your doctor for medical advice about side effects. You may report side effects to FDA at 1-800-FDA-1088. Where should I keep my medicine? This drug is given in a hospital or clinic and will not be stored at home. NOTE: This sheet is a summary. It may not cover all possible information. If you have questions about this medicine, talk to your doctor, pharmacist, or health care provider.  2020 Elsevier/Gold Standard (2007-06-08 14:38:05)  

## 2018-12-07 ENCOUNTER — Telehealth: Payer: Self-pay

## 2018-12-07 NOTE — Telephone Encounter (Signed)
Called to follow up after treatment yesterday. She is doing well today except feeling a little tired. Her abdomen does not feel as distended today as yesterday. She is questioning if she should have paracentesis tomorrow or reschedule. Encouraged her to keep appt and she is going to keep appt as scheduled.   She is worried about port placement on 10/9. She has a history of mitral valve prolapse and used to take antibiotic prior to procedures. In 2016 she thinks that Dr. Radford Pax told her that she no longer has MVP.  Does she need to take a antibiotic prior to port placement?

## 2018-12-08 ENCOUNTER — Telehealth: Payer: Self-pay

## 2018-12-08 ENCOUNTER — Ambulatory Visit (HOSPITAL_COMMUNITY)
Admission: RE | Admit: 2018-12-08 | Discharge: 2018-12-08 | Disposition: A | Discharge status: Home / Self Care | Payer: Medicare Other | Source: Ambulatory Visit | Attending: Hematology and Oncology | Admitting: Hematology and Oncology

## 2018-12-08 ENCOUNTER — Other Ambulatory Visit: Payer: Self-pay

## 2018-12-08 ENCOUNTER — Telehealth: Payer: Self-pay | Admitting: Cardiology

## 2018-12-08 DIAGNOSIS — C482 Malignant neoplasm of peritoneum, unspecified: Secondary | ICD-10-CM | POA: Diagnosis not present

## 2018-12-08 DIAGNOSIS — R180 Malignant ascites: Secondary | ICD-10-CM

## 2018-12-08 MED ORDER — LIDOCAINE HCL 1 % IJ SOLN
INTRAMUSCULAR | Status: AC
  Filled 2018-12-08: qty 10

## 2018-12-08 NOTE — Procedures (Signed)
Ultrasound-guided therapeutic paracentesis performed yielding 1.7 liters of amber fluid. No immediate complications. EBL < 1 cc.

## 2018-12-08 NOTE — Telephone Encounter (Signed)
Called and given below message. She verbalized understanding. 

## 2018-12-08 NOTE — Telephone Encounter (Signed)
I am not sure about the role of antibiotics before port I suggest she calls Dr. Theodosia Blender office to inquire

## 2018-12-08 NOTE — Telephone Encounter (Signed)
° ° °  Patient calling requesting to speak with nurse. Patient is wanting  "clearance"  from Dr Radford Pax at the request of her Oncology MD.  Advised patient she had not been seen since 2016 and an appointment would be appropriate. Patient declined to schedule. Requesting to speak with nursing staff

## 2018-12-08 NOTE — Telephone Encounter (Signed)
She called and left a message to call her. She is upset about cardiology appt to see if she needs to take antibiotics prior to port placement.  Called her back and reassured her to go to cardiology appt this Friday and call back with a update. She verbalized understanding.

## 2018-12-08 NOTE — Telephone Encounter (Signed)
I called pt and explained to her that we will need to make an appt since she has not bee seen since 07/11/2014. Pt states she she needs to have a port-a-cath put in 12/24/18. She states the oncologist just wanted to know if we could just look her recent labs from them and see if she is ok to have port-a-cath. I again explained to her that she will need to have an appt. Pt states she has a MVP and said she supposes that she will need an echo and is worried that this will delay her procedure for her port-a-cath. I explained to her that we will do our very best to not delay her procedure. Pt is agreeable to appt. I looked for an appt and was not able to find a a new pt appt in time enough for the pt. I then s/w Richardson Dopp, PAC to see if it was possible to use the 8:45 time slot for the pt, even though this was not a new pt time slot. Richardson Dopp, [facility] reviewed pt's chart and is agreeable to see pt 12/10/18 @ 8:45 am. Pt is aware of the appt time and she is to wear a mask for the whole visit. Pt thanked me for the call and my help.

## 2018-12-09 NOTE — Progress Notes (Signed)
Cardiology Office Note:    Date:  12/10/2018   ID:  [name] [name], DOB 1950-06-23, MRN [id number]  PCP:  Orpah Melter, MD  Cardiologist:  Fransico Him, MD   Electrophysiologist:  None   Referring MD: Orpah Melter, MD   Chief Complaint  Patient presents with  . Surgical clearance    History of Present Illness:    [name] [name] is a 68 y.o. female with:  Paroxysmal atrial tachycardia  PACs/PVCs  Mitral valve prolapse  LBBB  GERD  Ms. Wiltz was last seen by Dr. Radford Pax in 2016.  She complained of shortness of breath at that time and underwent stress testing and echocardiography.  An echocardiogram demonstrated an EF of 45-50%.  Myoview demonstrated no ischemia.  EF was normal.  An event monitor was also ordered at that time but was never done.    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  Notes from oncology indicate she may undergo debulking surgery if she has an adequate response to chemotherapy.  She is here today with her husband.  She has recently started chemotherapy.  Prior to starting chemotherapy, she was doing well without physical limitations.  She has not had chest pain, shortness of breath, syncope.  She has a hx of palpitations related to PVCs.  These have been stable for years.  She does not have a history of congenital heart defect or prior endocarditis.  She does not have diabetes.  She does not have coronary artery disease or vascular disease.  She does not smoke.   Prior CV studies:   The following studies were reviewed today:  Myoview 07/25/2014  Myocardial perfusion is abnormal. There is a moderate-sized fixed septal defect, secondary to LBBB. No reversible ischemia This is a low risk study. Overall left ventricular systolic function was normal. The left ventricular ejection fraction is normal (55-65%). There is no prior study for comparison.  Echocardiogram  07/25/2014 EF 45-50  Past Medical History:  Diagnosis Date  . Allergic rhinitis   . Cervical dysplasia 1980  . Cervicalgia   . Eczema    Rosacea,dermatitis-Dr [employer]  . Esophageal reflux   . Fibromyalgia   . GERD (gastroesophageal reflux disease)   . Glaucoma, narrow-angle   . Heart murmur   . Hiatal hernia   . Irritable bowel syndrome   . LBBB (left bundle branch block)   . Left bundle branch block   . 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 Mitral valve disorders(424.0)    No antibiotics required  . Osteoporosis 12/2016   T score -2.3, 2014 T score -2.5 AP spine  . PAC (premature atrial contraction) 2012   ,PVC's, and nonsustained atril tachycardia w aberration by heart monitor   . PVC (premature ventricular contraction)   . Stricture and stenosis of esophagus    Surgical Hx: The patient  has a past surgical history that includes Neck surgery [id number]); Rhinoplasty (1976); Tubal ligation (1977); Breast lumpectomy (1998); Refractive surgery; Cervical discectomy; and Gynecologic cryosurgery (1980).   Current Medications: Current Meds  Medication Sig  . Azelaic Acid (FINACEA EX) Apply topically daily.    . Calcium Carbonate-Vitamin D (CALTRATE 600+D) 600-400 MG-UNIT per tablet Take 1 tablet by mouth daily.   . cromolyn (NASALCROM) 5.2 MG/ACT nasal spray Place 1 spray into the  nose as needed.   Marland Kitchen dexamethasone (DECADRON) 4 MG tablet Take 2 tabs at the night before and 2 tab the morning of chemotherapy, every 3 weeks, by mouth  . gabapentin (NEURONTIN) 300 MG capsule Take 600 mg by mouth 3 (three) times daily.   Marland Kitchen lidocaine-prilocaine (EMLA) cream Apply to affected area once  . magnesium oxide (MAG-OX) 400 MG tablet Take 400 mg by mouth daily as needed.  . meloxicam (MOBIC) 7.5 MG tablet Take 7.5 mg by mouth 2 (two) times daily as needed.  . mupirocin ointment (BACTROBAN) 2 %  Apply 1 application topically as needed.  . ondansetron (ZOFRAN) 8 MG tablet Take 1 tablet (8 mg total) by mouth every 8 (eight) hours as needed for refractory nausea / vomiting.  . prochlorperazine (COMPAZINE) 10 MG tablet Take 1 tablet (10 mg total) by mouth every 6 (six) hours as needed (Nausea or vomiting).  . Risedronate Sodium 35 MG TBEC Take 1 tablet (35 mg total) by mouth once a week.  . Sulfacetamide Sodium 10 % CREA Apply topically at bedtime.       Allergies:   Oxybutynin chloride, Thimerosal, Tramadol, Avelox [moxifloxacin hcl in nacl], Doxycycline, Erythromycin, Metronidazole, Minocycline, Penicillins, Quinolones, and Sulfonamide derivatives   Social History   Tobacco Use  . Smoking status: Never Smoker  . Smokeless tobacco: Never Used  Substance Use Topics  . Alcohol use: Yes    Alcohol/week: 2.0 standard drinks    Types: 2 Standard drinks or equivalent per week    Comment: Social   . Drug use: No     Family Hx: The patient's family history includes Alcohol abuse in her sister; Aneurysm in her father; Cancer in her maternal grandfather, paternal uncle, paternal uncle, and paternal uncle; Depression in her mother and sister; Heart disease in her father, mother, paternal grandfather, and sister; Hypertension in her father, mother, and sister; Melanoma in her sister and sister; Ovarian cancer (age of onset: 67) in her sister; Stroke in her maternal grandmother and paternal grandmother; Uterine cancer in her sister. There is no history of Colon cancer.  ROS:   Please see the history of present illness.    ROS All other systems reviewed and are negative.   EKGs/Labs/Other Test Reviewed:    EKG:  EKG is  ordered today.  The ekg ordered today demonstrates normal sinus rhythm, heart rate 97, IVCD/borderline LBBB, QTC 462, no change from prior tracing dated 07/11/2014  Recent Labs: 11/30/2018: ALT 22; BUN 11; Creatinine 0.62; Hemoglobin 11.3; Platelet Count 580; Potassium 3.9;  Sodium 140   Recent Lipid Panel No results found for: CHOL, TRIG, HDL, CHOLHDL, LDLCALC, LDLDIRECT  Physical Exam:    VS:  BP 124/60   Pulse 97   Ht 5\' 5"  (1.651 m)   Wt 106 lb 6.4 oz (48.3 kg)   SpO2 98%   BMI 17.71 kg/m     Wt Readings from Last 3 Encounters:  12/10/18 106 lb 6.4 oz (48.3 kg)  12/06/18 112 lb 8 oz (51 kg)  11/30/18 112 lb 12.8 oz (51.2 kg)     Physical Exam  Constitutional: She is oriented to person, place, and time. No distress.  Thin, frail female  HENT:  Head: Normocephalic and atraumatic.  Eyes: No scleral icterus.  Neck: No JVD present. No thyromegaly present.  Cardiovascular: Normal rate and regular rhythm. Exam reveals no gallop and no midsystolic click.  No murmur heard. Pulmonary/Chest: Effort normal. She has no wheezes. She has no rales.  Abdominal: Soft. There is no abdominal tenderness.  Musculoskeletal:        General: No edema.  Lymphadenopathy:    She has no cervical adenopathy.  Neurological: She is alert and oriented to person, place, and time.  Skin: Skin is warm and dry.  Psychiatric: She has a normal mood and affect.    ASSESSMENT & PLAN:    1. Preoperative cardiovascular examination The patient may need peritoneal debulking surgery at some point.  She does have a history of left bundle branch block.  However, her Myoview in 2016 was low risk.  She had normal EF on nuclear stress test at that time.  She has not had symptoms to suggest angina.  Her risk for perioperative major cardiac event is low at 0.9% according to the Revised Cardiac Risk Index.  She does not require further CV testing at this time and is at acceptable risk.  If her surgery is delayed greater than 6 months from now, we can reevaluate her by telephone, if needed.  2. Peritoneal carcinoma [facility]) She has recently started chemotherapy with oncology.  As noted, she may need debulking surgery at some point in the future.  3. Mitral valve disorder The patient is  pending Port-A-Cath placement.  She does not require antibiotic prophylaxis.  4. PVC (premature ventricular contraction) Overall stable.  5. LBBB (left bundle branch block) Chronic.  Myoview in 2016 was low risk.   Dispo:  Return in about 2 years (around 12/09/2020) for Routine Follow Up with Dr. Radford Pax, or Richardson Dopp, PA-C.   Medication Adjustments/Labs and Tests Ordered: Current medicines are reviewed at length with the patient today.  Concerns regarding medicines are outlined above.  Tests Ordered: Orders Placed This Encounter  Procedures  . EKG 12-Lead   Medication Changes: No orders of the defined types were placed in this encounter.   Signed, Richardson Dopp, PA-C  12/10/2018 9:27 AM    [facility] [address], [place], [place]  [address] Phone: [phone]; Fax: [phone]

## 2018-12-10 ENCOUNTER — Encounter: Payer: Self-pay | Admitting: Physician Assistant

## 2018-12-10 ENCOUNTER — Ambulatory Visit (INDEPENDENT_AMBULATORY_CARE_PROVIDER_SITE_OTHER): Payer: Medicare Other | Admitting: Physician Assistant

## 2018-12-10 ENCOUNTER — Other Ambulatory Visit: Payer: Self-pay

## 2018-12-10 VITALS — BP 124/60 | HR 97 | Ht 65.0 in | Wt 106.4 lb

## 2018-12-10 DIAGNOSIS — I493 Ventricular premature depolarization: Secondary | ICD-10-CM | POA: Diagnosis not present

## 2018-12-10 DIAGNOSIS — C482 Malignant neoplasm of peritoneum, unspecified: Secondary | ICD-10-CM | POA: Diagnosis not present

## 2018-12-10 DIAGNOSIS — I447 Left bundle-branch block, unspecified: Secondary | ICD-10-CM

## 2018-12-10 DIAGNOSIS — I059 Rheumatic mitral valve disease, unspecified: Secondary | ICD-10-CM

## 2018-12-10 DIAGNOSIS — Z01810 Encounter for preprocedural cardiovascular examination: Secondary | ICD-10-CM | POA: Diagnosis not present

## 2018-12-10 NOTE — Patient Instructions (Signed)
Medication Instructions:  Your physician recommends that you continue on your current medications as directed. Please refer to the Current Medication list given to you today.  If you need a refill on your cardiac medications before your next appointment, please call your pharmacy.   Lab work: NONE If you have labs (blood work) drawn today and your tests are completely normal, you will receive your results only by: Marland Kitchen MyChart Message (if you have MyChart) OR . A paper copy in the mail If you have any lab test that is abnormal or we need to change your treatment, we will call you to review the results.  Testing/Procedures: NONE  Follow-Up: At [facility], you and your health needs are our priority.  As part of our continuing mission to provide you with exceptional heart care, we have created designated Provider Care Teams.  These Care Teams include your primary Cardiologist (physician) and Advanced Practice Providers (APPs -  Physician Assistants and Nurse Practitioners) who all work together to provide you with the care you need, when you need it. You will need a follow up appointment in 2 years.  Please call our office 2 months in advance to schedule this appointment.  You may see DR. TURNER or one of the following Advanced Practice Providers on your designated Care Team:   [place], PA-C Melina Copa, PA-C . Ermalinda Barrios, PA-C

## 2018-12-20 ENCOUNTER — Telehealth: Payer: Self-pay

## 2018-12-20 NOTE — Telephone Encounter (Signed)
She called and left a message to call her.  Called back. She saw cardiology and does not need to to take any antibiotics prior to port placement. She confirmed port placement time. Instructed to call the office for further questions or concerns. She verbalized understanding,

## 2018-12-21 MED FILL — DEXAMETHASONE 4 MG TABLET: 4 | 21 days supply | Qty: 4 | Fill #1

## 2018-12-22 ENCOUNTER — Encounter: Payer: Self-pay | Admitting: Gynecology

## 2018-12-23 ENCOUNTER — Telehealth: Payer: Self-pay

## 2018-12-23 ENCOUNTER — Other Ambulatory Visit: Payer: Self-pay | Admitting: Radiology

## 2018-12-23 NOTE — Telephone Encounter (Signed)
She called and left message to call her.  Called back. She is concerned about port placement. She want to give IR a heads up about a medication that has had in the past that she cannot take. Offered to call IR. She would like to talk with them. Phone number given.

## 2018-12-24 ENCOUNTER — Encounter (HOSPITAL_COMMUNITY): Payer: Self-pay

## 2018-12-24 ENCOUNTER — Ambulatory Visit (HOSPITAL_COMMUNITY)
Admission: RE | Admit: 2018-12-24 | Discharge: 2018-12-24 | Disposition: A | Discharge status: Home / Self Care | Payer: Medicare Other | Source: Ambulatory Visit | Attending: Hematology and Oncology | Admitting: Hematology and Oncology

## 2018-12-24 ENCOUNTER — Other Ambulatory Visit: Payer: Self-pay

## 2018-12-24 DIAGNOSIS — I447 Left bundle-branch block, unspecified: Secondary | ICD-10-CM | POA: Insufficient documentation

## 2018-12-24 DIAGNOSIS — Z79899 Other long term (current) drug therapy: Secondary | ICD-10-CM | POA: Insufficient documentation

## 2018-12-24 DIAGNOSIS — C482 Malignant neoplasm of peritoneum, unspecified: Secondary | ICD-10-CM | POA: Insufficient documentation

## 2018-12-24 DIAGNOSIS — M810 Age-related osteoporosis without current pathological fracture: Secondary | ICD-10-CM | POA: Insufficient documentation

## 2018-12-24 DIAGNOSIS — Z853 Personal history of malignant neoplasm of breast: Secondary | ICD-10-CM | POA: Insufficient documentation

## 2018-12-24 DIAGNOSIS — K219 Gastro-esophageal reflux disease without esophagitis: Secondary | ICD-10-CM | POA: Diagnosis not present

## 2018-12-24 DIAGNOSIS — Z452 Encounter for adjustment and management of vascular access device: Secondary | ICD-10-CM | POA: Diagnosis not present

## 2018-12-24 HISTORY — PX: IR IMAGING GUIDED PORT INSERTION: IMG5740

## 2018-12-24 LAB — CBC WITH DIFFERENTIAL/PLATELET
Abs Immature Granulocytes: 0.04 10*3/uL (ref 0.00–0.07)
Basophils Absolute: 0.1 10*3/uL (ref 0.0–0.1)
Basophils Relative: 1 %
Eosinophils Absolute: 0.1 10*3/uL (ref 0.0–0.5)
Eosinophils Relative: 1 %
HCT: 36.1 % (ref 36.0–46.0)
Hemoglobin: 11.3 g/dL — ABNORMAL LOW (ref 12.0–15.0)
Immature Granulocytes: 0 %
Lymphocytes Relative: 19 %
Lymphs Abs: 1.7 10*3/uL (ref 0.7–4.0)
MCH: 27.8 pg (ref 26.0–34.0)
MCHC: 31.3 g/dL (ref 30.0–36.0)
MCV: 88.7 fL (ref 80.0–100.0)
Monocytes Absolute: 1.1 10*3/uL — ABNORMAL HIGH (ref 0.1–1.0)
Monocytes Relative: 12 %
Neutro Abs: 6.2 10*3/uL (ref 1.7–7.7)
Neutrophils Relative %: 67 %
Platelets: 418 10*3/uL — ABNORMAL HIGH (ref 150–400)
RBC: 4.07 MIL/uL (ref 3.87–5.11)
RDW: 14.6 % (ref 11.5–15.5)
WBC: 9.1 10*3/uL (ref 4.0–10.5)
nRBC: 0 % (ref 0.0–0.2)

## 2018-12-24 LAB — PROTIME-INR
INR: 0.8 (ref 0.8–1.2)
Prothrombin Time: 11.4 seconds (ref 11.4–15.2)

## 2018-12-24 MED ORDER — MIDAZOLAM HCL 2 MG/2ML IJ SOLN
INTRAMUSCULAR | Status: AC
  Filled 2018-12-24: qty 2

## 2018-12-24 MED ORDER — LIDOCAINE-EPINEPHRINE 1 %-1:100000 IJ SOLN
INTRAMUSCULAR | Status: AC
  Filled 2018-12-24: qty 1

## 2018-12-24 MED ORDER — MIDAZOLAM HCL 2 MG/2ML IJ SOLN
INTRAMUSCULAR | Status: AC | PRN
  Administered 2018-12-24 (×3): 1 mg via INTRAVENOUS

## 2018-12-24 MED ORDER — LIDOCAINE-EPINEPHRINE (PF) 1 %-1:200000 IJ SOLN
INTRAMUSCULAR | Status: AC | PRN
  Administered 2018-12-24 (×2): 10 mL

## 2018-12-24 MED ORDER — HYDROMORPHONE HCL 2 MG/ML IJ SOLN
INTRAMUSCULAR | Status: AC
  Filled 2018-12-24: qty 1

## 2018-12-24 MED ORDER — SODIUM CHLORIDE 0.9 % IV SOLN
INTRAVENOUS | Status: DC
  Administered 2018-12-24: 09:00:00 via INTRAVENOUS

## 2018-12-24 MED ORDER — HEPARIN SOD (PORK) LOCK FLUSH 100 UNIT/ML IV SOLN
INTRAVENOUS | Status: AC
  Administered 2018-12-24: 11:00:00
  Filled 2018-12-24: qty 5

## 2018-12-24 MED ORDER — CLINDAMYCIN PHOSPHATE 900 MG/50ML IV SOLN
INTRAVENOUS | Status: AC
  Administered 2018-12-24: 900 mg via INTRAVENOUS
  Filled 2018-12-24: qty 50

## 2018-12-24 MED ORDER — CLINDAMYCIN PHOSPHATE 900 MG/50ML IV SOLN
900.0000 mg | Freq: Once | INTRAVENOUS | Status: AC
  Administered 2018-12-24: 10:00:00 900 mg via INTRAVENOUS

## 2018-12-24 NOTE — Discharge Instructions (Addendum)
Please do not apply any lotions or EMLA cream to port area until glue has fallen off/ 2weeks ; for any questions or concerns call [phone]. For after hours, call [phone] and ask for on call MD .   [place] An implanted port is a device that is placed under the skin. It is usually placed in the chest. The device can be used to give IV medicine, to take blood, or for dialysis. You may have an implanted port if:  You need IV medicine that would be irritating to the small veins in your hands or arms.  You need IV medicines, such as antibiotics, for a long period of time.  You need IV nutrition for a long period of time.  You need dialysis. Having a port means that your health care provider will not need to use the veins in your arms for these procedures. You may have fewer limitations when using a port than you would if you used other types of long-term IVs, and you will likely be able to return to normal activities after your incision heals. An implanted port has two main parts:  Reservoir. The reservoir is the part where a needle is inserted to give medicines or draw blood. The reservoir is round. After it is placed, it appears as a small, raised area under your skin.  Catheter. The catheter is a thin, flexible tube that connects the reservoir to a vein. Medicine that is inserted into the reservoir goes into the catheter and then into the vein. How is my port accessed? To access your port:  A numbing cream may be placed on the skin over the port site.  Your health care provider will put on a mask and sterile gloves.  The skin over your port will be cleaned carefully with a germ-killing soap and allowed to dry.  Your health care provider will gently pinch the port and insert a needle into it.  Your health care provider will check for a blood return to make sure the port is in the vein and is not clogged.  If your port needs to remain accessed to get medicine  continuously (constant infusion), your health care provider will place a clear bandage (dressing) over the needle site. The dressing and needle will need to be changed every week, or as told by your health care provider. What is flushing? Flushing helps keep the port from getting clogged. Follow instructions from your health care provider about how and when to flush the port. Ports are usually flushed with saline solution or a medicine called heparin. The need for flushing will depend on how the port is used:  If the port is only used from time to time to give medicines or draw blood, the port may need to be flushed: ? Before and after medicines have been given. ? Before and after blood has been drawn. ? As part of routine maintenance. Flushing may be recommended every 4-6 weeks.  If a constant infusion is running, the port may not need to be flushed.  Throw away any syringes in a disposal container that is meant for sharp items (sharps container). You can buy a sharps container from a pharmacy, or you can make one by using an empty hard plastic bottle with a cover. How long will my port stay implanted? The port can stay in for as long as your health care provider thinks it is needed. When it is time for the port to come out, a  surgery will be done to remove it. The surgery will be similar to the procedure that was done to put the port in. Follow these instructions at home:   Flush your port as told by your health care provider.  If you need an infusion over several days, follow instructions from your health care provider about how to take care of your port site. Make sure you: ? Wash your hands with soap and water before you change your dressing. If soap and water are not available, use alcohol-based hand sanitizer. ? Change your dressing as told by your health care provider. ? Place any used dressings or infusion bags into a plastic bag. Throw that bag in the trash. ? Keep the dressing that  covers the needle clean and dry. Do not get it wet. ? Do not use scissors or sharp objects near the tube. ? Keep the tube clamped, unless it is being used.  Check your port site every day for signs of infection. Check for: ? Redness, swelling, or pain. ? Fluid or blood. ? Pus or a bad smell.  Protect the skin around the port site. ? Avoid wearing bra straps that rub or irritate the site. ? Protect the skin around your port from seat belts. Place a soft pad over your chest if needed.  Bathe or shower as told by your health care provider. The site may get wet as long as you are not actively receiving an infusion.  Return to your normal activities as told by your health care provider. Ask your health care provider what activities are safe for you.  Carry a medical alert card or wear a medical alert bracelet at all times. This will let health care providers know that you have an implanted port in case of an emergency. Get help right away if:  You have redness, swelling, or pain at the port site.  You have fluid or blood coming from your port site.  You have pus or a bad smell coming from the port site.  You have a fever. Summary  Implanted ports are usually placed in the chest for long-term IV access.  Follow instructions from your health care provider about flushing the port and changing bandages (dressings).  Take care of the area around your port by avoiding clothing that puts pressure on the area, and by watching for signs of infection.  Protect the skin around your port from seat belts. Place a soft pad over your chest if needed.  Get help right away if you have a fever or you have redness, swelling, pain, drainage, or a bad smell at the port site. This information is not intended to replace advice given to you by your health care provider. Make sure you discuss any questions you have with your health care provider. Document Released: 03/03/2005 Document Revised: 06/25/2018  Document Reviewed: 04/05/2016 Elsevier Patient Education  [place]. Moderate Conscious Sedation, Adult, Care After These instructions provide you with information about caring for yourself after your procedure. Your health care provider may also give you more specific instructions. Your treatment has been planned according to current medical practices, but problems sometimes occur. Call your health care provider if you have any problems or questions after your procedure. What can I expect after the procedure? After your procedure, it is common:  To feel sleepy for several hours.  To feel clumsy and have poor balance for several hours.  To have poor judgment for several hours.  To vomit if  you eat too soon. Follow these instructions at home: For at least 24 hours after the procedure:   Do not: ? Participate in activities where you could fall or become injured. ? Drive. ? Use heavy machinery. ? Drink alcohol. ? Take sleeping pills or medicines that cause drowsiness. ? Make important decisions or sign legal documents. ? Take care of children on your own.  Rest. Eating and drinking  Follow the diet recommended by your health care provider.  If you vomit: ? Drink water, juice, or soup when you can drink without vomiting. ? Make sure you have little or no nausea before eating solid foods. General instructions  Have a responsible adult stay with you until you are awake and alert.  Take over-the-counter and prescription medicines only as told by your health care provider.  If you smoke, do not smoke without supervision.  Keep all follow-up visits as told by your health care provider. This is important. Contact a health care provider if:  You keep feeling nauseous or you keep vomiting.  You feel light-headed.  You develop a rash.  You have a fever. Get help right away if:  You have trouble breathing. This information is not intended to replace advice given to  you by your health care provider. Make sure you discuss any questions you have with your health care provider. Document Released: 12/22/2012 Document Revised: 02/13/2017 Document Reviewed: 06/23/2015 Elsevier Patient Education  2020 [employer].

## 2018-12-24 NOTE — Procedures (Signed)
Interventional Radiology Procedure Note  Procedure: Placement of a right IJ approach single lumen PowerPort.  Tip is positioned at the superior cavoatrial junction and catheter is ready for immediate use.  Complications: No immediate Recommendations:  - Ok to shower tomorrow - Do not submerge for 7 days - Routine line care   Signed,  Brevin Mcfadden K. Gianmarco Roye, MD   

## 2018-12-24 NOTE — H&P (Signed)
Referring Physician(s): [name]  Supervising Physician: Jacqulynn Cadet  Patient Status:  [name] [name] OP  Chief Complaint:  "I'm getting a port a cath"  Subjective: Patient familiar to IR service from paracentesis x2, latest on 12/08/2018.  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  She presents today for Port-A-Cath placement for chemotherapy.  She currently denies fever, headache, chest pain, dyspnea, cough, abdominal/back pain, nausea, vomiting or bleeding.  She does have some shoulder/neck pain secondary to fibromyalgia.  She is anxious.  Past Medical History:  Diagnosis Date  . Allergic rhinitis   . Cervical dysplasia 1980  . Cervicalgia   . Eczema    Rosacea,dermatitis-Dr [employer]  . Esophageal reflux   . Fibromyalgia   . GERD (gastroesophageal reflux disease)   . Glaucoma, narrow-angle   . Heart murmur   . Hiatal hernia   . Irritable bowel syndrome   . LBBB (left bundle branch block)   . Left bundle branch block   . 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 Mitral valve disorders(424.0)    No antibiotics required  . Osteoporosis 12/2016   T score -2.3, 2014 T score -2.5 AP spine  . PAC (premature atrial contraction) 2012   ,PVC's, and nonsustained atril tachycardia w aberration by heart monitor   . PVC (premature ventricular contraction)   . Stricture and stenosis of esophagus    Past Surgical History:  Procedure Laterality Date  . BREAST LUMPECTOMY  1998   right breast with radiation  . CERVICAL DISCECTOMY     C3-5 with bone graft  . GYNECOLOGIC CRYOSURGERY  1980  . NECK SURGERY  [phone]   bone graft and fusion  . REFRACTIVE SURGERY     narrow angle glucoma  . RHINOPLASTY  1976  . TUBAL LIGATION  1977      Allergies: Fentanyl, Oxybutynin chloride, Thimerosal, Tramadol,  Avelox [moxifloxacin hcl in nacl], Doxycycline, Erythromycin, Metronidazole, Minocycline, Penicillins, Quinolones, and Sulfonamide derivatives  Medications: Prior to Admission medications   Medication Sig Start Date End Date Taking? Authorizing Provider  Azelaic Acid (FINACEA EX) Apply topically daily.     Yes [provider]  Calcium Carbonate-Vitamin D (CALTRATE 600+D) 600-400 MG-UNIT per tablet Take 1 tablet by mouth daily.    Yes [provider]  cromolyn (NASALCROM) 5.2 MG/ACT nasal spray Place 1 spray into the nose as needed.    Yes [provider]  dexamethasone (DECADRON) 4 MG tablet Take 2 tabs at the night before and 2 tab the morning of chemotherapy, every 3 weeks, by mouth 11/30/18  Yes Gorsuch, Ni, MD  gabapentin (NEURONTIN) 300 MG capsule Take 600 mg by mouth 3 (three) times daily.  04/05/13  Yes [provider]  mupirocin ointment (BACTROBAN) 2 % Apply 1 application topically as needed. 08/24/18  Yes Fontaine, Belinda Block, MD  Sulfacetamide Sodium 10 % CREA Apply topically at bedtime.     Yes [provider]  lidocaine-prilocaine (EMLA) cream Apply to affected area once 11/30/18   [name], MD  magnesium oxide (MAG-OX) 400 MG tablet Take 400 mg by mouth daily as needed.    [provider]  meloxicam (MOBIC) 7.5 MG tablet Take 7.5 mg by mouth 2 (two) times daily as needed. 11/15/18   [provider]  ondansetron (ZOFRAN) 8 MG tablet  Take 1 tablet (8 mg total) by mouth every 8 (eight) hours as needed for refractory nausea / vomiting. 11/30/18   [name], MD  prochlorperazine (COMPAZINE) 10 MG tablet Take 1 tablet (10 mg total) by mouth every 6 (six) hours as needed (Nausea or vomiting). 11/30/18   [name], MD  Risedronate Sodium 35 MG TBEC Take 1 tablet (35 mg total) by mouth once a week. 09/06/18   Fontaine, Belinda Block, MD     Vital Signs: BP (!) 148/84   Pulse (!) 102   Temp 98 F (36.7 C) (Oral)   Resp 18   SpO2  98%   Physical Exam awake, alert.  Chest clear to auscultation bilaterally.  Heart with slightly tachycardic but regular rhythm.  Abdomen soft, positive bowel sounds, nontender.  No lower extremity edema.  Imaging: No results found.  Labs:  CBC: Recent Labs    11/23/18 1627 11/30/18 1508 12/24/18 0815  WBC 9.9 8.9 9.1  HGB 12.4 11.3* 11.3*  HCT 39.0 35.3* 36.1  PLT 459* 580* 418*    COAGS: Recent Labs    12/24/18 0815  INR 0.8    BMP: Recent Labs    11/23/18 1627 11/30/18 1508  NA 130* 140  K 3.5 3.9  CL 94* 101  CO2 21* 29  GLUCOSE 85 90  BUN 9 11  CALCIUM 8.8* 8.8*  CREATININE 0.48 0.62  GFRNONAA >60 >60  GFRAA >60 >60    LIVER FUNCTION TESTS: Recent Labs    11/23/18 1627 11/30/18 1508  BILITOT 0.6 <0.2*  AST 34 34  ALT 23 22  ALKPHOS 79 84  PROT 7.1 6.6  ALBUMIN 3.6 3.1*    Assessment and Plan: Pt with remote history of right breast DCIS and 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  She presents today for Port-A-Cath placement for chemotherapy. Risks and benefits of image guided port-a-catheter placement was discussed with the patient including, but not limited to bleeding, infection, pneumothorax, or fibrin sheath development and need for additional procedures.  All of the patient's questions were answered, patient is agreeable to proceed. Consent signed and in chart.     Electronically Signed: D. Rowe Robert, PA-C 12/24/2018, 8:55 AM   I spent a total of 25 minutes at the the patient's bedside AND on the patient's hospital floor or unit, greater than 50% of which was counseling/coordinating care for Port-A-Cath placement

## 2018-12-27 ENCOUNTER — Inpatient Hospital Stay: Payer: Medicare Other

## 2018-12-27 ENCOUNTER — Encounter: Payer: Self-pay | Admitting: Hematology and Oncology

## 2018-12-27 ENCOUNTER — Inpatient Hospital Stay (HOSPITAL_BASED_OUTPATIENT_CLINIC_OR_DEPARTMENT_OTHER): Payer: Medicare Other | Admitting: Hematology and Oncology

## 2018-12-27 ENCOUNTER — Other Ambulatory Visit: Payer: Self-pay

## 2018-12-27 ENCOUNTER — Encounter: Payer: Self-pay | Admitting: Oncology

## 2018-12-27 ENCOUNTER — Inpatient Hospital Stay: Payer: Medicare Other | Attending: Gynecologic Oncology

## 2018-12-27 VITALS — HR 60

## 2018-12-27 DIAGNOSIS — Z5111 Encounter for antineoplastic chemotherapy: Secondary | ICD-10-CM | POA: Insufficient documentation

## 2018-12-27 DIAGNOSIS — M898X9 Other specified disorders of bone, unspecified site: Secondary | ICD-10-CM | POA: Insufficient documentation

## 2018-12-27 DIAGNOSIS — C482 Malignant neoplasm of peritoneum, unspecified: Secondary | ICD-10-CM | POA: Diagnosis not present

## 2018-12-27 DIAGNOSIS — R180 Malignant ascites: Secondary | ICD-10-CM | POA: Insufficient documentation

## 2018-12-27 LAB — CMP (CANCER CENTER ONLY)
ALT: 17 U/L (ref 0–44)
AST: 20 U/L (ref 15–41)
Albumin: 3.6 g/dL (ref 3.5–5.0)
Alkaline Phosphatase: 102 U/L (ref 38–126)
Anion gap: 14 (ref 5–15)
BUN: 14 mg/dL (ref 8–23)
CO2: 25 mmol/L (ref 22–32)
Calcium: 9.6 mg/dL (ref 8.9–10.3)
Chloride: 96 mmol/L — ABNORMAL LOW (ref 98–111)
Creatinine: 0.67 mg/dL (ref 0.44–1.00)
GFR, Est AFR Am: >60 mL/min (ref >60)
GFR, Estimated: >60 mL/min (ref >60)
Glucose, Bld: 115 mg/dL — ABNORMAL HIGH (ref 70–99)
Potassium: 4.1 mmol/L (ref 3.5–5.1)
Sodium: 135 mmol/L (ref 135–145)
Total Bilirubin: 0.2 mg/dL — ABNORMAL LOW (ref 0.3–1.2)
Total Protein: 8 g/dL (ref 6.5–8.1)

## 2018-12-27 LAB — CBC WITH DIFFERENTIAL (CANCER CENTER ONLY)
Abs Immature Granulocytes: 0.07 10*3/uL (ref 0.00–0.07)
Basophils Absolute: 0 10*3/uL (ref 0.0–0.1)
Basophils Relative: 0 %
Eosinophils Absolute: 0 10*3/uL (ref 0.0–0.5)
Eosinophils Relative: 0 %
HCT: 32.3 % — ABNORMAL LOW (ref 36.0–46.0)
Hemoglobin: 10.8 g/dL — ABNORMAL LOW (ref 12.0–15.0)
Immature Granulocytes: 1 %
Lymphocytes Relative: 6 %
Lymphs Abs: 0.8 10*3/uL (ref 0.7–4.0)
MCH: 28.3 pg (ref 26.0–34.0)
MCHC: 33.4 g/dL (ref 30.0–36.0)
MCV: 84.8 fL (ref 80.0–100.0)
Monocytes Absolute: 0.3 10*3/uL (ref 0.1–1.0)
Monocytes Relative: 2 %
Neutro Abs: 12.4 10*3/uL — ABNORMAL HIGH (ref 1.7–7.7)
Neutrophils Relative %: 91 %
Platelet Count: 448 10*3/uL — ABNORMAL HIGH (ref 150–400)
RBC: 3.81 MIL/uL — ABNORMAL LOW (ref 3.87–5.11)
RDW: 14.1 % (ref 11.5–15.5)
WBC Count: 13.6 10*3/uL — ABNORMAL HIGH (ref 4.0–10.5)
nRBC: 0 % (ref 0.0–0.2)

## 2018-12-27 MED ORDER — SODIUM CHLORIDE 0.9 % IV SOLN
403.8000 mg | Freq: Once | INTRAVENOUS | Status: AC
  Administered 2018-12-27: 400 mg via INTRAVENOUS
  Filled 2018-12-27: qty 40

## 2018-12-27 MED ORDER — SODIUM CHLORIDE 0.9 % IV SOLN
Freq: Once | INTRAVENOUS | Status: AC
  Administered 2018-12-27: 12:00:00 via INTRAVENOUS
  Filled 2018-12-27: qty 5

## 2018-12-27 MED ORDER — PALONOSETRON HCL INJECTION 0.25 MG/5ML
INTRAVENOUS | Status: AC
  Filled 2018-12-27: qty 5

## 2018-12-27 MED ORDER — SODIUM CHLORIDE 0.9 % IV SOLN
175.0000 mg/m2 | Freq: Once | INTRAVENOUS | Status: AC
  Administered 2018-12-27: 270 mg via INTRAVENOUS
  Filled 2018-12-27: qty 45

## 2018-12-27 MED ORDER — SODIUM CHLORIDE 0.9% FLUSH
10.0000 mL | INTRAVENOUS | Status: DC | PRN
  Administered 2018-12-27: 10 mL
  Filled 2018-12-27: qty 10

## 2018-12-27 MED ORDER — HEPARIN SOD (PORK) LOCK FLUSH 100 UNIT/ML IV SOLN
500.0000 [IU] | Freq: Once | INTRAVENOUS | Status: AC | PRN
  Administered 2018-12-27: 500 [IU]
  Filled 2018-12-27: qty 5

## 2018-12-27 MED ORDER — DIPHENHYDRAMINE HCL 50 MG/ML IJ SOLN
INTRAMUSCULAR | Status: AC
  Filled 2018-12-27: qty 1

## 2018-12-27 MED ORDER — PALONOSETRON HCL INJECTION 0.25 MG/5ML
0.2500 mg | Freq: Once | INTRAVENOUS | Status: AC
  Administered 2018-12-27: 0.25 mg via INTRAVENOUS

## 2018-12-27 MED ORDER — FAMOTIDINE IN NACL 20-0.9 MG/50ML-% IV SOLN
20.0000 mg | Freq: Once | INTRAVENOUS | Status: AC
  Administered 2018-12-27: 20 mg via INTRAVENOUS

## 2018-12-27 MED ORDER — DIPHENHYDRAMINE HCL 25 MG PO CAPS
ORAL_CAPSULE | ORAL | Status: AC
  Filled 2018-12-27: qty 1

## 2018-12-27 MED ORDER — DIPHENHYDRAMINE HCL 50 MG/ML IJ SOLN
25.0000 mg | Freq: Once | INTRAMUSCULAR | Status: AC
  Administered 2018-12-27: 25 mg via INTRAVENOUS

## 2018-12-27 MED ORDER — FAMOTIDINE IN NACL 20-0.9 MG/50ML-% IV SOLN
INTRAVENOUS | Status: AC
  Filled 2018-12-27: qty 50

## 2018-12-27 MED ORDER — SODIUM CHLORIDE 0.9% FLUSH
10.0000 mL | Freq: Once | INTRAVENOUS | Status: AC
  Administered 2018-12-27: 10 mL
  Filled 2018-12-27: qty 10

## 2018-12-27 MED ORDER — SODIUM CHLORIDE 0.9 % IV SOLN
Freq: Once | INTRAVENOUS | Status: AC
  Administered 2018-12-27: 11:00:00 via INTRAVENOUS
  Filled 2018-12-27: qty 250

## 2018-12-27 NOTE — Assessment & Plan Note (Signed)
So far, she tolerated treatment well except for expected side effects such as weakness, bone achiness and some fatigue She felt that her ascites has resolved Her blood work showed improvement of her total protein and albumin Clinically, she has positive response to treatment I recommend we proceed without dose adjustment I will plan CT imaging after cycle 3 and we will arrange appointment for her to see GYN surgeon after CT imaging

## 2018-12-27 NOTE — Progress Notes (Signed)
[place] OFFICE PROGRESS NOTE  Patient Care Team: Orpah Melter, MD as PCP - General (Family Medicine) Sueanne Margarita, MD as PCP - Cardiology (Cardiology)  ASSESSMENT & PLAN:  Peritoneal carcinoma [facility]) So far, she tolerated treatment well except for expected side effects such as weakness, bone achiness and some fatigue She felt that her ascites has resolved Her blood work showed improvement of her total protein and albumin Clinically, she has positive response to treatment I recommend we proceed without dose adjustment I will plan CT imaging after cycle 3 and we will arrange appointment for her to see GYN surgeon after CT imaging  Malignant ascites She has no palpable ascites on exam We will monitor closely  Bone pain She has multifactorial bone pain, likely related to side effects of chemotherapy and recent flare of fibromyalgia We discussed chronic pain management The patient declined narcotic prescription   No orders of the defined types were placed in this encounter.   INTERVAL HISTORY: Please see below for problem oriented charting. She returns to be seen prior to cycle 2 of treatment She tolerated treatment well except for bony aches, fatigue and leg weakness She denies nausea No peripheral neuropathy She felt that ascites are resolving No recent fever or chills  SUMMARY OF ONCOLOGIC HISTORY: Oncology History  Peritoneal carcinoma ([place])  11/23/2018 Imaging   Ct abdomen and pelvis 1. Findings highly suspicious for peritoneal carcinomatosis. Recommend paracentesis for therapeutic and diagnostic purposes. I do not see an obvious primary lesion but there is some irregular enhancing soft tissue in the right adnexal area. CA 125 level may be helpful. 2. Mild surface irregularity involving the liver but I do not see any obvious changes of cirrhosis. No hepatic lesions.   11/23/2018 Tumor Marker   Patient's tumor was tested for the following markers:  CA-125 Results of the tumor marker test revealed 253.   11/25/2018 Initial Diagnosis   Peritoneal carcinoma ([place])   11/25/2018 Imaging   US paracentesis Successful ultrasound-guided paracentesis yielding 3.4 L of peritoneal fluid.   12/01/2018 Cancer Staging   Staging form: Ovary, Fallopian Tube, and Primary Peritoneal Carcinoma, AJCC 8th Edition - Clinical stage from 12/01/2018: cT3, cN0, cM0 - Signed by [name], MD on 12/01/2018   12/02/2018 Tumor Marker   Patient's tumor was tested for the following markers: CA-125 Results of the tumor marker test revealed 267   12/06/2018 -  Chemotherapy   The patient had carboplatin and taxol for chemotherapy treatment.     12/08/2018 Procedure   Successful ultrasound-guided therapeutic paracentesis yielding 1.7 liters of peritoneal fluid.   12/24/2018 Procedure   Successful placement of a right IJ approach Power Port with ultrasound and fluoroscopic guidance. The catheter is ready for use     REVIEW OF SYSTEMS:   Constitutional: Denies fevers, chills or abnormal weight loss Eyes: Denies blurriness of vision Ears, nose, mouth, throat, and face: Denies mucositis or sore throat Respiratory: Denies cough, dyspnea or wheezes Cardiovascular: Denies palpitation, chest discomfort or lower extremity swelling Gastrointestinal:  Denies nausea, heartburn or change in bowel habits Skin: Denies abnormal skin rashes Lymphatics: Denies new lymphadenopathy or easy bruising Neurological:Denies numbness, tingling or new weaknesses Behavioral/Psych: Mood is stable, no new changes  All other systems were reviewed with the patient and are negative.  I have reviewed the past medical history, past surgical history, social history and family history with the patient and they are unchanged from previous note.  ALLERGIES:  is allergic to fentanyl; oxybutynin  chloride; thimerosal; tramadol; avelox [moxifloxacin hcl in nacl]; doxycycline; erythromycin;  metronidazole; minocycline; penicillins; quinolones; and sulfonamide derivatives.  MEDICATIONS:  Current Outpatient Medications  Medication Sig Dispense Refill  . Azelaic Acid (FINACEA EX) Apply topically daily.      . Calcium Carbonate-Vitamin D (CALTRATE 600+D) 600-400 MG-UNIT per tablet Take 1 tablet by mouth daily.     . cromolyn (NASALCROM) 5.2 MG/ACT nasal spray Place 1 spray into the nose as needed.     Marland Kitchen dexamethasone (DECADRON) 4 MG tablet Take 2 tabs at the night before and 2 tab the morning of chemotherapy, every 3 weeks, by mouth 4 tablet 6  . gabapentin (NEURONTIN) 300 MG capsule Take 600 mg by mouth 3 (three) times daily.     Marland Kitchen lidocaine-prilocaine (EMLA) cream Apply to affected area once 30 g 3  . magnesium oxide (MAG-OX) 400 MG tablet Take 400 mg by mouth daily as needed.    . meloxicam (MOBIC) 7.5 MG tablet Take 7.5 mg by mouth 2 (two) times daily as needed.    . mupirocin ointment (BACTROBAN) 2 % Apply 1 application topically as needed. 22 g 2  . ondansetron (ZOFRAN) 8 MG tablet Take 1 tablet (8 mg total) by mouth every 8 (eight) hours as needed for refractory nausea / vomiting. 30 tablet 1  . prochlorperazine (COMPAZINE) 10 MG tablet Take 1 tablet (10 mg total) by mouth every 6 (six) hours as needed (Nausea or vomiting). 30 tablet 1  . Risedronate Sodium 35 MG TBEC Take 1 tablet (35 mg total) by mouth once a week. 4 tablet 11  . Sulfacetamide Sodium 10 % CREA Apply topically at bedtime.       No current facility-administered medications for this visit.    Facility-Administered Medications Ordered in Other Visits  Medication Dose Route Frequency Provider Last Rate Last Dose  . CARBOplatin (PARAPLATIN) 400 mg in sodium chloride 0.9 % 250 mL chemo infusion  400 mg Intravenous Once Alvy Bimler, Abby Tucholski, MD      . heparin lock flush 100 unit/mL  500 Units Intracatheter Once PRN Alvy Bimler, Ellyn Rubiano, MD      . PACLitaxel (TAXOL) 270 mg in sodium chloride 0.9 % 250 mL chemo infusion (> 80mg /m2)   175 mg/m2 (Treatment Plan Recorded) Intravenous Once Branston Halsted, MD      . sodium chloride flush (NS) 0.9 % injection 10 mL  10 mL Intracatheter PRN Alvy Bimler, Mehmet Scally, MD        PHYSICAL EXAMINATION: ECOG PERFORMANCE STATUS: 1 - Symptomatic but completely ambulatory  Vitals:   12/27/18 1045  BP: (!) 124/58  Pulse: (!) 113  Resp: 18  Temp: 98.3 F (36.8 C)  SpO2: 100%   Filed Weights   12/27/18 1045  Weight: 107 lb 3.2 oz (48.6 kg)    GENERAL:alert, no distress and comfortable.  She looks thin SKIN: skin color, texture, turgor are normal, no rashes or significant lesions EYES: normal, Conjunctiva are pink and non-injected, sclera clear OROPHARYNX:no exudate, no erythema and lips, buccal mucosa, and tongue normal  NECK: supple, thyroid normal size, non-tender, without nodularity LYMPH:  no palpable lymphadenopathy in the cervical, axillary or inguinal LUNGS: clear to auscultation and percussion with normal breathing effort HEART: regular rate & rhythm and no murmurs and no lower extremity edema ABDOMEN:abdomen soft, non-tender and normal bowel sounds Musculoskeletal:no cyanosis of digits and no clubbing  NEURO: alert & oriented x 3 with fluent speech, no focal motor/sensory deficits  LABORATORY DATA:  I have reviewed the data as  listed    Component Value Date/Time   NA 135 12/27/2018 1035   K 4.1 12/27/2018 1035   CL 96 (L) 12/27/2018 1035   CO2 25 12/27/2018 1035   GLUCOSE 115 (H) 12/27/2018 1035   BUN 14 12/27/2018 1035   CREATININE 0.67 12/27/2018 1035   CALCIUM 9.6 12/27/2018 1035   PROT 8.0 12/27/2018 1035   ALBUMIN 3.6 12/27/2018 1035   AST 20 12/27/2018 1035   ALT 17 12/27/2018 1035   ALKPHOS 102 12/27/2018 1035   BILITOT 0.2 (L) 12/27/2018 1035   GFRNONAA >60 12/27/2018 1035   GFRAA >60 12/27/2018 1035    No results found for: SPEP, UPEP  Lab Results  Component Value Date   WBC 13.6 (H) 12/27/2018   NEUTROABS 12.4 (H) 12/27/2018   HGB 10.8 (L) 12/27/2018    HCT 32.3 (L) 12/27/2018   MCV 84.8 12/27/2018   PLT 448 (H) 12/27/2018      Chemistry      Component Value Date/Time   NA 135 12/27/2018 1035   K 4.1 12/27/2018 1035   CL 96 (L) 12/27/2018 1035   CO2 25 12/27/2018 1035   BUN 14 12/27/2018 1035   CREATININE 0.67 12/27/2018 1035      Component Value Date/Time   CALCIUM 9.6 12/27/2018 1035   ALKPHOS 102 12/27/2018 1035   AST 20 12/27/2018 1035   ALT 17 12/27/2018 1035   BILITOT 0.2 (L) 12/27/2018 1035       RADIOGRAPHIC STUDIES: I have personally reviewed the radiological images as listed and agreed with the findings in the report. US Paracentesis  Result Date: 12/08/2018 INDICATION: Patient with remote history of right breast DCIS; now with peritoneal carcinoma and recurrent malignant ascites; request made for therapeutic paracentesis. EXAM: ULTRASOUND GUIDED THERAPEUTIC PARACENTESIS MEDICATIONS: None COMPLICATIONS: None immediate. PROCEDURE: Informed written consent was obtained from the patient after a discussion of the risks, benefits and alternatives to treatment. A timeout was performed prior to the initiation of the procedure. Initial ultrasound scanning demonstrates a small to moderate amount of ascites within the right mid to lower abdominal quadrant. The right mid to lower abdomen was prepped and draped in the usual sterile fashion. 1% lidocaine was used for local anesthesia. Following this, a 6 Fr Safe-T-Centesis catheter was introduced. An ultrasound image was saved for documentation purposes. The paracentesis was performed. The catheter was removed and a dressing was applied. The patient tolerated the procedure well without immediate post procedural complication. FINDINGS: A total of approximately 1.7 liters of amber fluid was removed. IMPRESSION: Successful ultrasound-guided therapeutic paracentesis yielding 1.7 liters of peritoneal fluid. Read by: Rowe Robert, PA-C Electronically Signed   By: Sandi Mariscal M.D.   On:  12/08/2018 10:54   Ir Imaging Guided Port Insertion  Result Date: 12/24/2018 INDICATION: 68 year old female with a history of breast cancer and peritoneal carcinomatosis. She presents for port catheter placement. EXAM: IMPLANTED PORT A CATH PLACEMENT WITH ULTRASOUND AND FLUOROSCOPIC GUIDANCE MEDICATIONS: 900 mg clindamycin; The antibiotic was administered within an appropriate time interval prior to skin puncture. ANESTHESIA/SEDATION: Versed 3 mg IV.   This does not constitute moderate sedation. FLUOROSCOPY TIME:  0 minutes, 48 seconds (2 mGy) COMPLICATIONS: None immediate. PROCEDURE: The right neck and chest was prepped with chlorhexidine, and draped in the usual sterile fashion using maximum barrier technique (cap and mask, sterile gown, sterile gloves, large sterile sheet, hand hygiene and cutaneous antiseptic). Local anesthesia was attained by infiltration with 1% lidocaine with epinephrine. Ultrasound demonstrated patency of  the right internal jugular vein, and this was documented with an image. Under real-time ultrasound guidance, this vein was accessed with a 21 gauge micropuncture needle and image documentation was performed. A small dermatotomy was made at the access site with an 11 scalpel. A 0.018" wire was advanced into the SVC and the access needle exchanged for a 59F micropuncture vascular sheath. The 0.018" wire was then removed and a 0.035" wire advanced into the IVC. An appropriate location for the subcutaneous reservoir was selected below the clavicle and an incision was made through the skin and underlying soft tissues. The subcutaneous tissues were then dissected using a combination of blunt and sharp surgical technique and a pocket was formed. A single lumen power injectable slim profile portacatheter was then tunneled through the subcutaneous tissues from the pocket to the dermatotomy and the port reservoir placed within the subcutaneous pocket. The venous access site was then serially  dilated and a peel away vascular sheath placed over the wire. The wire was removed and the port catheter advanced into position under fluoroscopic guidance. The catheter tip is positioned in the superior cavoatrial junction. This was documented with a spot image. The portacatheter was then tested and found to flush and aspirate well. The port was flushed with saline followed by 100 units/mL heparinized saline. The pocket was then closed in two layers using first subdermal inverted interrupted absorbable sutures followed by a running subcuticular suture. The epidermis was then sealed with Dermabond. The dermatotomy at the venous access site was also closed with Dermabond. IMPRESSION: Successful placement of a right IJ approach Power Port with ultrasound and fluoroscopic guidance. The catheter is ready for use. Electronically Signed   By: Jacqulynn Cadet M.D.   On: 12/24/2018 13:18    All questions were answered. The patient knows to call the clinic with any problems, questions or concerns. No barriers to learning was detected.  I spent 15 minutes counseling the patient face to face. The total time spent in the appointment was 20 minutes and more than 50% was on counseling and review of test results  [name], MD 12/27/2018 12:46 PM

## 2018-12-27 NOTE — Assessment & Plan Note (Signed)
She has multifactorial bone pain, likely related to side effects of chemotherapy and recent flare of fibromyalgia We discussed chronic pain management The patient declined narcotic prescription

## 2018-12-27 NOTE — Patient Instructions (Signed)
[facility] Discharge Instructions for Patients Receiving Chemotherapy  Today you received the following chemotherapy agents Paclitaxel (TAXOL) & Carboplatin (PARAPLATIN).  To help prevent nausea and vomiting after your treatment, we encourage you to take your nausea medication as prescribed.   If you develop nausea and vomiting that is not controlled by your nausea medication, call the clinic.   BELOW ARE SYMPTOMS THAT SHOULD BE REPORTED IMMEDIATELY:  *FEVER GREATER THAN 100.5 F  *CHILLS WITH OR WITHOUT FEVER  NAUSEA AND VOMITING THAT IS NOT CONTROLLED WITH YOUR NAUSEA MEDICATION  *UNUSUAL SHORTNESS OF BREATH  *UNUSUAL BRUISING OR BLEEDING  TENDERNESS IN MOUTH AND THROAT WITH OR WITHOUT PRESENCE OF ULCERS  *URINARY PROBLEMS  *BOWEL PROBLEMS  UNUSUAL RASH Items with * indicate a potential emergency and should be followed up as soon as possible.  Feel free to call the clinic should you have any questions or concerns. The clinic phone number is [phone].  Please show the CHEMO ALERT CARD at check-in to the Emergency Department and triage nurse.  Coronavirus (COVID-19) Are you at risk?  Are you at risk for the Coronavirus (COVID-19)?  To be considered HIGH RISK for Coronavirus (COVID-19), you have to meet the following criteria:  . Traveled to [place], [place], [name], [place] or [place]; or in the [place] to [place], [place], [place], or [place]; and have fever, cough, and shortness of breath within the last 2 weeks of travel OR . Been in close contact with a person diagnosed with COVID-19 within the last 2 weeks and have fever, cough, and shortness of breath . IF YOU DO NOT MEET THESE CRITERIA, YOU ARE CONSIDERED LOW RISK FOR COVID-19.  What to do if you are HIGH RISK for COVID-19?  . If you are having a medical emergency, call 911. . Seek medical care right away. Before you go to a doctor's office, urgent care or emergency department,  call ahead and tell them about your recent travel, contact with someone diagnosed with COVID-19, and your symptoms. You should receive instructions from your physician's office regarding next steps of care.  . When you arrive at healthcare provider, tell the healthcare staff immediately you have returned from visiting [place], [place], [place], [place] or [name]; or traveled in the [place] to [place], [place], [place], or [place]; in the last two weeks or you have been in close contact with a person diagnosed with COVID-19 in the last 2 weeks.   . Tell the health care staff about your symptoms: fever, cough and shortness of breath. . After you have been seen by a medical provider, you will be either: o Tested for (COVID-19) and discharged home on quarantine except to seek medical care if symptoms worsen, and asked to  - Stay home and avoid contact with others until you get your results (4-5 days)  - Avoid travel on public transportation if possible (such as bus, train, or airplane) or o Sent to the Emergency Department by EMS for evaluation, COVID-19 testing, and possible admission depending on your condition and test results.  What to do if you are LOW RISK for COVID-19?  Reduce your risk of any infection by using the same precautions used for avoiding the common cold or flu:  . Wash your hands often with soap and warm water for at least 20 seconds.  If soap and water are not readily available, use an alcohol-based hand sanitizer with at least 60% alcohol.  .   If coughing or sneezing, cover your mouth and nose by coughing or sneezing into the elbow areas of your shirt or coat, into a tissue or into your sleeve (not your hands). . Avoid shaking hands with others and consider head nods or verbal greetings only. . Avoid touching your eyes, nose, or mouth with unwashed hands.  . Avoid close contact with people who are sick. . Avoid places or events with large numbers of people in one  location, like concerts or sporting events. . Carefully consider travel plans you have or are making. . If you are planning any travel outside or inside the [place], visit the CDC's Travelers' Health webpage for the latest health notices. . If you have some symptoms but not all symptoms, continue to monitor at home and seek medical attention if your symptoms worsen. . If you are having a medical emergency, call 911.   ADDITIONAL HEALTHCARE OPTIONS FOR PATIENTS  [facility] / e-Visit: [email or web address]         [facility]: [phone]  [facility]: [phone]                   [facility]: [phone]   

## 2018-12-27 NOTE — Assessment & Plan Note (Signed)
She has no palpable ascites on exam We will monitor closely

## 2018-12-28 ENCOUNTER — Telehealth: Payer: Self-pay | Admitting: Hematology and Oncology

## 2018-12-28 NOTE — Telephone Encounter (Signed)
I talk with patient regarding schedule  

## 2019-01-03 ENCOUNTER — Telehealth: Payer: Self-pay | Admitting: Oncology

## 2019-01-03 NOTE — Telephone Encounter (Signed)
[name] [name] and scheduled follow up with Dr. Denman George on 02/01/19 at 12 pm.  She verbalized understanding and agreement.

## 2019-01-12 MED FILL — DEXAMETHASONE 4 MG TABLET: 4 | 21 days supply | Qty: 4 | Fill #2

## 2019-01-17 ENCOUNTER — Inpatient Hospital Stay: Payer: Medicare Other

## 2019-01-17 ENCOUNTER — Inpatient Hospital Stay (HOSPITAL_BASED_OUTPATIENT_CLINIC_OR_DEPARTMENT_OTHER): Payer: Medicare Other | Admitting: Hematology and Oncology

## 2019-01-17 ENCOUNTER — Other Ambulatory Visit: Payer: Self-pay

## 2019-01-17 ENCOUNTER — Encounter: Payer: Self-pay | Admitting: Hematology and Oncology

## 2019-01-17 ENCOUNTER — Inpatient Hospital Stay: Payer: Medicare Other | Attending: Gynecologic Oncology

## 2019-01-17 VITALS — BP 148/71 | HR 120 | Temp 97.8°F | Resp 18 | Ht 65.0 in | Wt 103.7 lb

## 2019-01-17 VITALS — HR 104

## 2019-01-17 DIAGNOSIS — R180 Malignant ascites: Secondary | ICD-10-CM

## 2019-01-17 DIAGNOSIS — T451X5A Adverse effect of antineoplastic and immunosuppressive drugs, initial encounter: Secondary | ICD-10-CM | POA: Diagnosis not present

## 2019-01-17 DIAGNOSIS — D6481 Anemia due to antineoplastic chemotherapy: Secondary | ICD-10-CM

## 2019-01-17 DIAGNOSIS — R188 Other ascites: Secondary | ICD-10-CM | POA: Diagnosis not present

## 2019-01-17 DIAGNOSIS — C482 Malignant neoplasm of peritoneum, unspecified: Secondary | ICD-10-CM | POA: Diagnosis not present

## 2019-01-17 DIAGNOSIS — M797 Fibromyalgia: Secondary | ICD-10-CM | POA: Insufficient documentation

## 2019-01-17 DIAGNOSIS — M898X9 Other specified disorders of bone, unspecified site: Secondary | ICD-10-CM

## 2019-01-17 DIAGNOSIS — Z5111 Encounter for antineoplastic chemotherapy: Secondary | ICD-10-CM | POA: Insufficient documentation

## 2019-01-17 DIAGNOSIS — R1900 Intra-abdominal and pelvic swelling, mass and lump, unspecified site: Secondary | ICD-10-CM | POA: Insufficient documentation

## 2019-01-17 LAB — CBC WITH DIFFERENTIAL (CANCER CENTER ONLY)
Abs Immature Granulocytes: 0.06 10*3/uL (ref 0.00–0.07)
Basophils Absolute: 0 10*3/uL (ref 0.0–0.1)
Basophils Relative: 0 %
Eosinophils Absolute: 0 10*3/uL (ref 0.0–0.5)
Eosinophils Relative: 0 %
HCT: 32.6 % — ABNORMAL LOW (ref 36.0–46.0)
Hemoglobin: 10.8 g/dL — ABNORMAL LOW (ref 12.0–15.0)
Immature Granulocytes: 0 %
Lymphocytes Relative: 6 %
Lymphs Abs: 0.8 10*3/uL (ref 0.7–4.0)
MCH: 27.9 pg (ref 26.0–34.0)
MCHC: 33.1 g/dL (ref 30.0–36.0)
MCV: 84.2 fL (ref 80.0–100.0)
Monocytes Absolute: 0.1 10*3/uL (ref 0.1–1.0)
Monocytes Relative: 1 %
Neutro Abs: 13.4 10*3/uL — ABNORMAL HIGH (ref 1.7–7.7)
Neutrophils Relative %: 93 %
Platelet Count: 434 10*3/uL — ABNORMAL HIGH (ref 150–400)
RBC: 3.87 MIL/uL (ref 3.87–5.11)
RDW: 15.2 % (ref 11.5–15.5)
WBC Count: 14.4 10*3/uL — ABNORMAL HIGH (ref 4.0–10.5)
nRBC: 0 % (ref 0.0–0.2)

## 2019-01-17 LAB — CMP (CANCER CENTER ONLY)
ALT: 19 U/L (ref 0–44)
AST: 18 U/L (ref 15–41)
Albumin: 4 g/dL (ref 3.5–5.0)
Alkaline Phosphatase: 94 U/L (ref 38–126)
Anion gap: 14 (ref 5–15)
BUN: 10 mg/dL (ref 8–23)
CO2: 25 mmol/L (ref 22–32)
Calcium: 10.2 mg/dL (ref 8.9–10.3)
Chloride: 101 mmol/L (ref 98–111)
Creatinine: 0.61 mg/dL (ref 0.44–1.00)
GFR, Est AFR Am: >60 mL/min (ref >60)
GFR, Estimated: >60 mL/min (ref >60)
Glucose, Bld: 123 mg/dL — ABNORMAL HIGH (ref 70–99)
Potassium: 3.8 mmol/L (ref 3.5–5.1)
Sodium: 140 mmol/L (ref 135–145)
Total Bilirubin: 0.2 mg/dL — ABNORMAL LOW (ref 0.3–1.2)
Total Protein: 8.4 g/dL — ABNORMAL HIGH (ref 6.5–8.1)

## 2019-01-17 MED ORDER — FAMOTIDINE IN NACL 20-0.9 MG/50ML-% IV SOLN
20.0000 mg | Freq: Once | INTRAVENOUS | Status: AC
  Administered 2019-01-17: 13:00:00 20 mg via INTRAVENOUS

## 2019-01-17 MED ORDER — DIPHENHYDRAMINE HCL 50 MG/ML IJ SOLN
INTRAMUSCULAR | Status: AC
  Filled 2019-01-17: qty 1

## 2019-01-17 MED ORDER — PALONOSETRON HCL INJECTION 0.25 MG/5ML
0.2500 mg | Freq: Once | INTRAVENOUS | Status: AC
  Administered 2019-01-17: 13:00:00 0.25 mg via INTRAVENOUS

## 2019-01-17 MED ORDER — SODIUM CHLORIDE 0.9 % IV SOLN
Freq: Once | INTRAVENOUS | Status: AC
  Administered 2019-01-17: 13:00:00 via INTRAVENOUS
  Filled 2019-01-17: qty 5

## 2019-01-17 MED ORDER — DIPHENHYDRAMINE HCL 50 MG/ML IJ SOLN
25.0000 mg | Freq: Once | INTRAMUSCULAR | Status: AC
  Administered 2019-01-17: 25 mg via INTRAVENOUS

## 2019-01-17 MED ORDER — HEPARIN SOD (PORK) LOCK FLUSH 100 UNIT/ML IV SOLN
500.0000 [IU] | Freq: Once | INTRAVENOUS | Status: AC | PRN
  Administered 2019-01-17: 18:00:00 500 [IU]
  Filled 2019-01-17: qty 5

## 2019-01-17 MED ORDER — PALONOSETRON HCL INJECTION 0.25 MG/5ML
INTRAVENOUS | Status: AC
  Filled 2019-01-17: qty 5

## 2019-01-17 MED ORDER — SODIUM CHLORIDE 0.9% FLUSH
10.0000 mL | INTRAVENOUS | Status: DC | PRN
  Administered 2019-01-17: 18:00:00 10 mL
  Filled 2019-01-17: qty 10

## 2019-01-17 MED ORDER — FAMOTIDINE IN NACL 20-0.9 MG/50ML-% IV SOLN
INTRAVENOUS | Status: AC
  Filled 2019-01-17: qty 50

## 2019-01-17 MED ORDER — SODIUM CHLORIDE 0.9 % IV SOLN
403.8000 mg | Freq: Once | INTRAVENOUS | Status: AC
  Administered 2019-01-17: 17:00:00 400 mg via INTRAVENOUS
  Filled 2019-01-17: qty 40

## 2019-01-17 MED ORDER — SODIUM CHLORIDE 0.9% FLUSH
10.0000 mL | Freq: Once | INTRAVENOUS | Status: AC
  Administered 2019-01-17: 10 mL
  Filled 2019-01-17: qty 10

## 2019-01-17 MED ORDER — SODIUM CHLORIDE 0.9 % IV SOLN
175.0000 mg/m2 | Freq: Once | INTRAVENOUS | Status: AC
  Administered 2019-01-17: 14:00:00 270 mg via INTRAVENOUS
  Filled 2019-01-17: qty 45

## 2019-01-17 MED ORDER — SODIUM CHLORIDE 0.9 % IV SOLN
Freq: Once | INTRAVENOUS | Status: AC
  Administered 2019-01-17: 12:00:00 via INTRAVENOUS
  Filled 2019-01-17: qty 250

## 2019-01-17 NOTE — Patient Instructions (Signed)
[facility] Discharge Instructions for Patients Receiving Chemotherapy  Today you received the following chemotherapy agents Paclitaxel (TAXOL) & Carboplatin (PARAPLATIN).  To help prevent nausea and vomiting after your treatment, we encourage you to take your nausea medication as prescribed.   If you develop nausea and vomiting that is not controlled by your nausea medication, call the clinic.   BELOW ARE SYMPTOMS THAT SHOULD BE REPORTED IMMEDIATELY:  *FEVER GREATER THAN 100.5 F  *CHILLS WITH OR WITHOUT FEVER  NAUSEA AND VOMITING THAT IS NOT CONTROLLED WITH YOUR NAUSEA MEDICATION  *UNUSUAL SHORTNESS OF BREATH  *UNUSUAL BRUISING OR BLEEDING  TENDERNESS IN MOUTH AND THROAT WITH OR WITHOUT PRESENCE OF ULCERS  *URINARY PROBLEMS  *BOWEL PROBLEMS  UNUSUAL RASH Items with * indicate a potential emergency and should be followed up as soon as possible.  Feel free to call the clinic should you have any questions or concerns. The clinic phone number is [phone].  Please show the CHEMO ALERT CARD at check-in to the Emergency Department and triage nurse.  Coronavirus (COVID-19) Are you at risk?  Are you at risk for the Coronavirus (COVID-19)?  To be considered HIGH RISK for Coronavirus (COVID-19), you have to meet the following criteria:  . Traveled to [place], [place], [name], [place] or [place]; or in the [place] to [place], [place], [place], or [place]; and have fever, cough, and shortness of breath within the last 2 weeks of travel OR . Been in close contact with a person diagnosed with COVID-19 within the last 2 weeks and have fever, cough, and shortness of breath . IF YOU DO NOT MEET THESE CRITERIA, YOU ARE CONSIDERED LOW RISK FOR COVID-19.  What to do if you are HIGH RISK for COVID-19?  . If you are having a medical emergency, call 911. . Seek medical care right away. Before you go to a doctor's office, urgent care or emergency department,  call ahead and tell them about your recent travel, contact with someone diagnosed with COVID-19, and your symptoms. You should receive instructions from your physician's office regarding next steps of care.  . When you arrive at healthcare provider, tell the healthcare staff immediately you have returned from visiting [place], [place], [place], [place] or [name]; or traveled in the [place] to [place], [place], [place], or [place]; in the last two weeks or you have been in close contact with a person diagnosed with COVID-19 in the last 2 weeks.   . Tell the health care staff about your symptoms: fever, cough and shortness of breath. . After you have been seen by a medical provider, you will be either: o Tested for (COVID-19) and discharged home on quarantine except to seek medical care if symptoms worsen, and asked to  - Stay home and avoid contact with others until you get your results (4-5 days)  - Avoid travel on public transportation if possible (such as bus, train, or airplane) or o Sent to the Emergency Department by EMS for evaluation, COVID-19 testing, and possible admission depending on your condition and test results.  What to do if you are LOW RISK for COVID-19?  Reduce your risk of any infection by using the same precautions used for avoiding the common cold or flu:  . Wash your hands often with soap and warm water for at least 20 seconds.  If soap and water are not readily available, use an alcohol-based hand sanitizer with at least 60% alcohol.  .   If coughing or sneezing, cover your mouth and nose by coughing or sneezing into the elbow areas of your shirt or coat, into a tissue or into your sleeve (not your hands). . Avoid shaking hands with others and consider head nods or verbal greetings only. . Avoid touching your eyes, nose, or mouth with unwashed hands.  . Avoid close contact with people who are sick. . Avoid places or events with large numbers of people in one  location, like concerts or sporting events. . Carefully consider travel plans you have or are making. . If you are planning any travel outside or inside the [place], visit the CDC's Travelers' Health webpage for the latest health notices. . If you have some symptoms but not all symptoms, continue to monitor at home and seek medical attention if your symptoms worsen. . If you are having a medical emergency, call 911.   ADDITIONAL HEALTHCARE OPTIONS FOR PATIENTS  [facility] / e-Visit: [email or web address]         [facility]: [phone]  [facility]: [phone]                   [facility]: [phone]   

## 2019-01-17 NOTE — Progress Notes (Signed)
Pt's HR consistently in 100's, ok to treat per Dr. Alvy Bimler.

## 2019-01-17 NOTE — Progress Notes (Signed)
Met with patient to introduce myself as Arboriculturist and to offer available resources.  Discussed one-time $1000 Radio broadcast assistant to assist with personal expenses while going through treatment. Patient states she is ok for now.  Gave her my card if interested in applying and for any additional financial questions or concerns.

## 2019-01-18 ENCOUNTER — Telehealth: Payer: Self-pay | Admitting: Oncology

## 2019-01-18 ENCOUNTER — Encounter: Payer: Self-pay | Admitting: Hematology and Oncology

## 2019-01-18 ENCOUNTER — Telehealth: Payer: Self-pay

## 2019-01-18 DIAGNOSIS — D6481 Anemia due to antineoplastic chemotherapy: Secondary | ICD-10-CM | POA: Insufficient documentation

## 2019-01-18 DIAGNOSIS — T451X5A Adverse effect of antineoplastic and immunosuppressive drugs, initial encounter: Secondary | ICD-10-CM | POA: Insufficient documentation

## 2019-01-18 LAB — CA 125: Cancer Antigen (CA) 125: 57.2 U/mL — ABNORMAL HIGH (ref 0.0–38.1)

## 2019-01-18 NOTE — Assessment & Plan Note (Signed)

## 2019-01-18 NOTE — Telephone Encounter (Signed)
Called and given below message. She verbalized understanding. 

## 2019-01-18 NOTE — Progress Notes (Signed)
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[facility]) Clinically, she has responded to treatment well She has no detectable ascites on exam Her tumor markers are markedly improved She is able to tolerate side effects of chemotherapy with aggressive supportive care The plan will be to proceed with treatment as scheduled I will order a CT scan to be done in about 2 weeks She has appointment made to see GYN oncologist to discuss whether interval debulking surgery is appropriate  Bone pain She has significant bone pain and flare of fibromyalgia during chemotherapy which is not unusual She will continue conservative management with over-the-counter analgesics and pain medicine as needed  Anemia due to antineoplastic chemotherapy This is likely due to recent treatment. The patient denies recent history of bleeding such as epistaxis, hematuria or hematochezia. She is asymptomatic from the anemia. I will observe for now.  She does not require transfusion now. I will continue the chemotherapy at current dose without dosage adjustment.  If the anemia gets progressive worse in the future, I might have to delay her treatment or adjust the chemotherapy dose.    Orders Placed This Encounter  Procedures  . CT ABDOMEN PELVIS W CONTRAST    Standing Status:   Future    Standing Expiration Date:   01/17/2020    Order Specific Question:   If indicated for the ordered procedure, I authorize the administration of contrast media per Radiology protocol    Answer:   Yes    Order Specific Question:   Preferred imaging location?    Answer:   [facility]    Order Specific Question:   Radiology Contrast Protocol - do NOT remove file path    Answer:   \\[email or web address]  . CT CHEST W CONTRAST    Standing  Status:   Future    Standing Expiration Date:   01/17/2020    Order Specific Question:   If indicated for the ordered procedure, I authorize the administration of contrast media per Radiology protocol    Answer:   Yes    Order Specific Question:   Preferred imaging location?    Answer:   [facility]    Order Specific Question:   Radiology Contrast Protocol - do NOT remove file path    Answer:   \\[email or web address]    INTERVAL HISTORY: Please see below for problem oriented charting. She returns for cycle 3 of chemotherapy She tolerated treatment reasonably well with expected side effects She had recent port placement without complication She denies nausea or constipation with treatment She complained of bone ache, fatigue and weakness overall with recent flare of fibromyalgia She noted some low-grade temperature but none of the documented temperature was over 99.5 She has concern over recent port placement with occasional discomfort She denies peripheral neuropathy  SUMMARY OF ONCOLOGIC HISTORY: Oncology History  Peritoneal carcinoma ([place])  11/23/2018 Imaging   Ct abdomen and pelvis 1. Findings highly suspicious for peritoneal carcinomatosis. Recommend paracentesis for therapeutic and diagnostic purposes. I do not see an obvious primary lesion but there is some irregular enhancing soft tissue in the right adnexal area. CA 125 level may be helpful. 2. Mild surface irregularity involving the liver but I do not see any obvious changes of cirrhosis. No hepatic lesions.   11/23/2018 Tumor Marker   Patient's tumor was tested  for the following markers: CA-125 Results of the tumor marker test revealed 253.   11/25/2018 Initial Diagnosis   Peritoneal carcinoma ([place])   11/25/2018 Imaging   US paracentesis Successful ultrasound-guided paracentesis yielding 3.4 L of peritoneal fluid.   12/01/2018 Cancer Staging   Staging form: Ovary, Fallopian Tube, and Primary  Peritoneal Carcinoma, AJCC 8th Edition - Clinical stage from 12/01/2018: cT3, cN0, cM0 - Signed by [name], MD on 12/01/2018   12/02/2018 Tumor Marker   Patient's tumor was tested for the following markers: CA-125 Results of the tumor marker test revealed 267   12/06/2018 -  Chemotherapy   The patient had carboplatin and taxol for chemotherapy treatment.     12/08/2018 Procedure   Successful ultrasound-guided therapeutic paracentesis yielding 1.7 liters of peritoneal fluid.   12/24/2018 Procedure   Successful placement of a right IJ approach Power Port with ultrasound and fluoroscopic guidance. The catheter is ready for use   01/17/2019 Tumor Marker   Patient's tumor was tested for the following markers: CA-125 Results of the tumor marker test revealed 57.2     REVIEW OF SYSTEMS:   Eyes: Denies blurriness of vision Ears, nose, mouth, throat, and face: Denies mucositis or sore throat Respiratory: Denies cough, dyspnea or wheezes Cardiovascular: Denies palpitation, chest discomfort or lower extremity swelling Skin: Denies abnormal skin rashes Lymphatics: Denies new lymphadenopathy or easy bruising Neurological:Denies numbness, tingling or new weaknesses Behavioral/Psych: Mood is stable, no new changes  All other systems were reviewed with the patient and are negative.  I have reviewed the past medical history, past surgical history, social history and family history with the patient and they are unchanged from previous note.  ALLERGIES:  is allergic to fentanyl; oxybutynin chloride; thimerosal; tramadol; avelox [moxifloxacin hcl in nacl]; doxycycline; erythromycin; metronidazole; minocycline; penicillins; quinolones; and sulfonamide derivatives.  MEDICATIONS:  Current Outpatient Medications  Medication Sig Dispense Refill  . Azelaic Acid (FINACEA EX) Apply topically daily.      . Calcium Carbonate-Vitamin D (CALTRATE 600+D) 600-400 MG-UNIT per tablet Take 1 tablet by mouth daily.      . cromolyn (NASALCROM) 5.2 MG/ACT nasal spray Place 1 spray into the nose as needed.     Marland Kitchen dexamethasone (DECADRON) 4 MG tablet Take 2 tabs at the night before and 2 tab the morning of chemotherapy, every 3 weeks, by mouth 4 tablet 6  . gabapentin (NEURONTIN) 300 MG capsule Take 600 mg by mouth 3 (three) times daily.     Marland Kitchen lidocaine-prilocaine (EMLA) cream Apply to affected area once 30 g 3  . magnesium oxide (MAG-OX) 400 MG tablet Take 400 mg by mouth daily as needed.    . meloxicam (MOBIC) 7.5 MG tablet Take 7.5 mg by mouth 2 (two) times daily as needed.    . mupirocin ointment (BACTROBAN) 2 % Apply 1 application topically as needed. 22 g 2  . ondansetron (ZOFRAN) 8 MG tablet Take 1 tablet (8 mg total) by mouth every 8 (eight) hours as needed for refractory nausea / vomiting. 30 tablet 1  . prochlorperazine (COMPAZINE) 10 MG tablet Take 1 tablet (10 mg total) by mouth every 6 (six) hours as needed (Nausea or vomiting). 30 tablet 1  . Risedronate Sodium 35 MG TBEC Take 1 tablet (35 mg total) by mouth once a week. 4 tablet 11  . Sulfacetamide Sodium 10 % CREA Apply topically at bedtime.       No current facility-administered medications for this visit.     PHYSICAL EXAMINATION: ECOG  PERFORMANCE STATUS: 1 - Symptomatic but completely ambulatory  Vitals:   01/17/19 1126  BP: (!) 148/71  Pulse: (!) 120  Resp: 18  Temp: 97.8 F (36.6 C)  SpO2: 100%   Filed Weights   01/17/19 1126  Weight: 103 lb 11.2 oz (47 kg)    GENERAL:alert, no distress and comfortable SKIN: skin color, texture, turgor are normal, no rashes or significant lesions EYES: normal, Conjunctiva are pink and non-injected, sclera clear OROPHARYNX:no exudate, no erythema and lips, buccal mucosa, and tongue normal  NECK: supple, thyroid normal size, non-tender, without nodularity LYMPH:  no palpable lymphadenopathy in the cervical, axillary or inguinal LUNGS: clear to auscultation and percussion with normal breathing  effort HEART: regular rate & rhythm and no murmurs and no lower extremity edema ABDOMEN:abdomen soft, non-tender and normal bowel sounds Musculoskeletal:no cyanosis of digits and no clubbing.  The port site looks okay NEURO: alert & oriented x 3 with fluent speech, no focal motor/sensory deficits  LABORATORY DATA:  I have reviewed the data as listed    Component Value Date/Time   NA 140 01/17/2019 1114   K 3.8 01/17/2019 1114   CL 101 01/17/2019 1114   CO2 25 01/17/2019 1114   GLUCOSE 123 (H) 01/17/2019 1114   BUN 10 01/17/2019 1114   CREATININE 0.61 01/17/2019 1114   CALCIUM 10.2 01/17/2019 1114   PROT 8.4 (H) 01/17/2019 1114   ALBUMIN 4.0 01/17/2019 1114   AST 18 01/17/2019 1114   ALT 19 01/17/2019 1114   ALKPHOS 94 01/17/2019 1114   BILITOT 0.2 (L) 01/17/2019 1114   GFRNONAA >60 01/17/2019 1114   GFRAA >60 01/17/2019 1114    No results found for: SPEP, UPEP  Lab Results  Component Value Date   WBC 14.4 (H) 01/17/2019   NEUTROABS 13.4 (H) 01/17/2019   HGB 10.8 (L) 01/17/2019   HCT 32.6 (L) 01/17/2019   MCV 84.2 01/17/2019   PLT 434 (H) 01/17/2019      Chemistry      Component Value Date/Time   NA 140 01/17/2019 1114   K 3.8 01/17/2019 1114   CL 101 01/17/2019 1114   CO2 25 01/17/2019 1114   BUN 10 01/17/2019 1114   CREATININE 0.61 01/17/2019 1114      Component Value Date/Time   CALCIUM 10.2 01/17/2019 1114   ALKPHOS 94 01/17/2019 1114   AST 18 01/17/2019 1114   ALT 19 01/17/2019 1114   BILITOT 0.2 (L) 01/17/2019 1114       RADIOGRAPHIC STUDIES: I have personally reviewed the radiological images as listed and agreed with the findings in the report. Ir Imaging Guided Port Insertion  Result Date: 12/24/2018 INDICATION: 69 year old female with a history of breast cancer and peritoneal carcinomatosis. She presents for port catheter placement. EXAM: IMPLANTED PORT A CATH PLACEMENT WITH ULTRASOUND AND FLUOROSCOPIC GUIDANCE MEDICATIONS: 900 mg clindamycin;  The antibiotic was administered within an appropriate time interval prior to skin puncture. ANESTHESIA/SEDATION: Versed 3 mg IV.   This does not constitute moderate sedation. FLUOROSCOPY TIME:  0 minutes, 48 seconds (2 mGy) COMPLICATIONS: None immediate. PROCEDURE: The right neck and chest was prepped with chlorhexidine, and draped in the usual sterile fashion using maximum barrier technique (cap and mask, sterile gown, sterile gloves, large sterile sheet, hand hygiene and cutaneous antiseptic). Local anesthesia was attained by infiltration with 1% lidocaine with epinephrine. Ultrasound demonstrated patency of the right internal jugular vein, and this was documented with an image. Under real-time ultrasound guidance, this vein was accessed  with a 21 gauge micropuncture needle and image documentation was performed. A small dermatotomy was made at the access site with an 11 scalpel. A 0.018" wire was advanced into the SVC and the access needle exchanged for a 56F micropuncture vascular sheath. The 0.018" wire was then removed and a 0.035" wire advanced into the IVC. An appropriate location for the subcutaneous reservoir was selected below the clavicle and an incision was made through the skin and underlying soft tissues. The subcutaneous tissues were then dissected using a combination of blunt and sharp surgical technique and a pocket was formed. A single lumen power injectable slim profile portacatheter was then tunneled through the subcutaneous tissues from the pocket to the dermatotomy and the port reservoir placed within the subcutaneous pocket. The venous access site was then serially dilated and a peel away vascular sheath placed over the wire. The wire was removed and the port catheter advanced into position under fluoroscopic guidance. The catheter tip is positioned in the superior cavoatrial junction. This was documented with a spot image. The portacatheter was then tested and found to flush and aspirate well.  The port was flushed with saline followed by 100 units/mL heparinized saline. The pocket was then closed in two layers using first subdermal inverted interrupted absorbable sutures followed by a running subcuticular suture. The epidermis was then sealed with Dermabond. The dermatotomy at the venous access site was also closed with Dermabond. IMPRESSION: Successful placement of a right IJ approach Power Port with ultrasound and fluoroscopic guidance. The catheter is ready for use. Electronically Signed   By: Jacqulynn Cadet M.D.   On: 12/24/2018 13:18    All questions were answered. The patient knows to call the clinic with any problems, questions or concerns. No barriers to learning was detected.  I spent 15 minutes counseling the patient face to face. The total time spent in the appointment was 20 minutes and more than 50% was on counseling and review of test results  [name], MD 01/18/2019 7:23 AM

## 2019-01-18 NOTE — Telephone Encounter (Signed)
Called [place] and let her know that her husband can attend her appointment with Dr. Denman George on 02/01/19.

## 2019-01-18 NOTE — Telephone Encounter (Signed)
-----   Message from [name], MD sent at 01/18/2019  7:07 AM EST ----- Regarding: pls let her know CA-125 is better

## 2019-01-18 NOTE — Assessment & Plan Note (Signed)
Clinically, she has responded to treatment well She has no detectable ascites on exam Her tumor markers are markedly improved She is able to tolerate side effects of chemotherapy with aggressive supportive care The plan will be to proceed with treatment as scheduled I will order a CT scan to be done in about 2 weeks She has appointment made to see GYN oncologist to discuss whether interval debulking surgery is appropriate

## 2019-01-18 NOTE — Assessment & Plan Note (Signed)
She has significant bone pain and flare of fibromyalgia during chemotherapy which is not unusual She will continue conservative management with over-the-counter analgesics and pain medicine as needed

## 2019-01-31 ENCOUNTER — Other Ambulatory Visit: Payer: Self-pay

## 2019-01-31 ENCOUNTER — Encounter (HOSPITAL_COMMUNITY): Payer: Self-pay

## 2019-01-31 ENCOUNTER — Ambulatory Visit (HOSPITAL_COMMUNITY)
Admission: RE | Admit: 2019-01-31 | Discharge: 2019-01-31 | Disposition: A | Discharge status: Home / Self Care | Payer: Medicare Other | Source: Ambulatory Visit | Attending: Hematology and Oncology | Admitting: Hematology and Oncology

## 2019-01-31 DIAGNOSIS — C482 Malignant neoplasm of peritoneum, unspecified: Secondary | ICD-10-CM | POA: Diagnosis not present

## 2019-01-31 DIAGNOSIS — R180 Malignant ascites: Secondary | ICD-10-CM

## 2019-01-31 DIAGNOSIS — C481 Malignant neoplasm of specified parts of peritoneum: Secondary | ICD-10-CM | POA: Diagnosis not present

## 2019-01-31 MED ORDER — HEPARIN SOD (PORK) LOCK FLUSH 100 UNIT/ML IV SOLN
INTRAVENOUS | Status: AC
  Administered 2019-01-31: 500 [IU] via INTRAVENOUS
  Filled 2019-01-31: qty 5

## 2019-01-31 MED ORDER — SODIUM CHLORIDE (PF) 0.9 % IJ SOLN
INTRAMUSCULAR | Status: AC
  Filled 2019-01-31: qty 50

## 2019-01-31 MED ORDER — IOHEXOL 300 MG/ML  SOLN
100.0000 mL | Freq: Once | INTRAMUSCULAR | Status: AC | PRN
  Administered 2019-01-31: 100 mL via INTRAVENOUS

## 2019-01-31 MED ORDER — HEPARIN SOD (PORK) LOCK FLUSH 100 UNIT/ML IV SOLN
500.0000 [IU] | Freq: Once | INTRAVENOUS | Status: AC
  Administered 2019-01-31: 08:00:00 500 [IU] via INTRAVENOUS

## 2019-02-01 ENCOUNTER — Inpatient Hospital Stay (HOSPITAL_BASED_OUTPATIENT_CLINIC_OR_DEPARTMENT_OTHER): Payer: Medicare Other | Admitting: Gynecologic Oncology

## 2019-02-01 ENCOUNTER — Other Ambulatory Visit: Payer: Self-pay

## 2019-02-01 ENCOUNTER — Encounter: Payer: Self-pay | Admitting: Gynecologic Oncology

## 2019-02-01 VITALS — BP 129/68 | HR 106 | Temp 98.0°F | Resp 17 | Ht 65.0 in | Wt 107.0 lb

## 2019-02-01 DIAGNOSIS — R1900 Intra-abdominal and pelvic swelling, mass and lump, unspecified site: Secondary | ICD-10-CM | POA: Diagnosis not present

## 2019-02-01 DIAGNOSIS — Z5111 Encounter for antineoplastic chemotherapy: Secondary | ICD-10-CM | POA: Diagnosis not present

## 2019-02-01 DIAGNOSIS — C482 Malignant neoplasm of peritoneum, unspecified: Secondary | ICD-10-CM

## 2019-02-01 DIAGNOSIS — M797 Fibromyalgia: Secondary | ICD-10-CM | POA: Diagnosis not present

## 2019-02-01 DIAGNOSIS — R180 Malignant ascites: Secondary | ICD-10-CM | POA: Diagnosis not present

## 2019-02-01 DIAGNOSIS — R188 Other ascites: Secondary | ICD-10-CM | POA: Diagnosis not present

## 2019-02-01 MED ORDER — ENOXAPARIN SODIUM 40 MG/0.4ML ~~LOC~~ SOLN: 40.0000 mg | SUBCUTANEOUS | 0 refills | Status: DC

## 2019-02-01 MED ORDER — OXYCODONE HCL 5 MG PO TABS: 5.0000 mg | ORAL_TABLET | ORAL | 0 refills | Status: DC | PRN

## 2019-02-01 MED ORDER — SENNOSIDES-DOCUSATE SODIUM 8.6-50 MG PO TABS: 2.0000 | ORAL_TABLET | Freq: Every day | ORAL | 1 refills | Status: DC

## 2019-02-01 NOTE — H&P (View-Only) (Signed)
Follow-up Note: Gyn-Onc  Consult was initially requested by Dr. Olen Pel for the evaluation of [name] [name] 68 y.o. female  CC: Ascites, pelvic mass  Assessment/Plan:  [name] is a 68 y.o. with a history of stage IIIC cancer, s/p 3 cycles of neoadjuvant chemotherapy.  She has had a good partial response to 3 cycles of chemotherapy. Today we reviewed her CT from 01/31/19.   I am recommending interval cytoreductive surgery.  After my evaluation of her scans, and physical examination, I think she is a good candidate for robotic assisted total hysterectomy with BSO, omentectomy, possible tumor debulking.  I explained in detail the procedure, the potential need for mini laparotomy laparotomy, the potential risks associated with such procedure.  I explained that surgery is recommended in conjunction with ovarian cancer chemotherapy in order to obtain best outcomes and likelihood of either cure and during remission.  I explained surgical risks including  bleeding, infection, damage to internal organs (such as bladder,ureters, bowels), blood clot, reoperation and rehospitalization. I explained that her increased risk for VTE perioperatively due to ovarian cancer surgery would be medicated with 2 weeks of postoperative Lovenox.  She is very concerned about opioid use postop and we counseled her extensively about use of anti-inflammatory medications to avoid this.  She had history of neck surgery and cannot hyperextend her neck, I counseled her to inform anesthesia of this on the day of surgery as they may require fiberoptic or glide scope to achieve intubation.  I counseled her that after surgery and a 3 to 4-week recovery.  She would likely then transition to her final 3 cycles of chemotherapy.  I explained that she will need to have genetic testing of both germline and somatic to rule out BRCA mutations or HRD.  If these are present she would be a candidate for maintenance PARP  inhibitor therapy.   HPI: [name] is a 68 y.o.   nulliparous female who reported menopause in her late 37s.  She reported a 6-year history of fibromyalgia.  She was in her usual state of health until the spring/summer of 2020 when she noticed increased abdominal girth and some dyspnea upon exertion.  After Friday, September 4 she noted poor appetite and a rather significant increase in the abdominal distention.  She reported heartburn and intermittent nausea.  There had been no rectal or vaginal bleeding but she did report intermittent diarrhea.  SHERIAL EBRAHIM was evaluated  in the ED for the complaint of abdominal distention on November 23, 2018.  A CT scan was notable for a slightly irregular liver contour.  Significant perihepatic ascites.  There was vague ill-defined enhancing appearing peritoneal lesions in the mesentery worrisome for peritoneal carcinomatosis.  The uterus and left ovary appeared normal but there was an ill-defined irregular enhancing soft tissue noted in the region of the right adnexa without discrete masses.  Large volume abdominal pelvic ascites was noted.  Ca1 25 was collected and returned at a value of 253.  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  Her most recent mammogram was in June 2020 and was  within normal limits, her  last colonoscopy was in October 2014 and also was  within normal limits  [name] [name]'s  family history is notable for a sister diagnosed with multiple primaries ovarian cancer the age of 82 and vulva melanoma  at the age of 82.  A second sister was diagnosed with melanoma in situ.  A paternal uncle was diagnosed with multiple primaries namely pancreatic cancer multiple myeloma and melanoma.  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  She was diagnosed with stage 3C cancer and was recommended neoadjuvant chemotherapy for 3 cycles followed by interval cytoreductive surgery.   Chemotherapy with carboplatin and paclitaxel began on 12/06/2018.  Day 1 of cycle 3 was administered on 01/17/2019.   CA 125 on 01/17/19 was 57.2.  CT abd/pelvis on 01/31/19 showed significant decrease in peritoneal carcinomatosis since previous study. Interval resolution of ascites. Focal area of parenchymal consolidation in central right middle lobe, which measures 3 cm. Differential diagnosis includes infectious or inflammatory process, atelectasis, and neoplasm.  Review of Systems: Constitutional  Feels well,  Cardiovascular  No chest pain, shortness of breath, or edema  Pulmonary  No cough or wheeze.  Reports shallow breaths because of abdominal distention sleeps on one pillow Gastro Intestinal  Mittens nausea, vomitting, remittent diarrhoea. No bright red blood per rectum, distention that causes abdominal pain, no change in bowel movement, or constipation.  Genito Urinary  No frequency, urgency, dysuria, no vaginal bleeding musculo Skeletal  Intermittent myalgia, arthralgia, joint swelling and pain with flareups of fibromyalgia neurologic  No weakness, numbness, change in gait,  Psychology  No depression, anxiety, insomnia.    Current Meds:  Outpatient Encounter Medications as of 02/01/2019  Medication Sig  . Azelaic Acid (FINACEA EX) Apply topically daily.    . Calcium Carbonate-Vitamin D (CALTRATE 600+D) 600-400 MG-UNIT per tablet Take 1 tablet by mouth daily.   . cromolyn (NASALCROM) 5.2 MG/ACT nasal spray Place 1 spray into the nose as needed.   Marland Kitchen dexamethasone (DECADRON) 4 MG tablet Take 2 tabs at the night before and 2 tab the morning of chemotherapy, every 3 weeks, by mouth  . [START ON 02/18/2019] enoxaparin (LOVENOX) 40 MG/0.4ML injection Inject 0.4 mLs (40 mg total) into the  skin daily for 14 doses. For AFTER surgery only  . gabapentin (NEURONTIN) 300 MG capsule Take 600 mg by mouth 3 (three) times daily.   Marland Kitchen lidocaine-prilocaine (EMLA) cream Apply to affected area once  . magnesium oxide (MAG-OX) 400 MG tablet Take 400 mg by mouth daily as needed.  . meloxicam (MOBIC) 7.5 MG tablet Take 7.5 mg by mouth 2 (two) times daily as needed.  . mupirocin ointment (BACTROBAN) 2 % Apply 1 application topically as needed.  . ondansetron (ZOFRAN) 8 MG tablet Take 1 tablet (8 mg total) by mouth every 8 (eight) hours as needed for refractory nausea / vomiting.  Marland Kitchen oxyCODONE (OXY IR/ROXICODONE) 5 MG immediate release tablet Take 1 tablet (5 mg total) by mouth every 4 (four) hours as needed for severe pain.  Marland Kitchen prochlorperazine (COMPAZINE) 10 MG tablet Take 1 tablet (10 mg total) by mouth every 6 (six) hours as needed (Nausea or vomiting).  . Risedronate Sodium 35 MG TBEC Take 1 tablet (35 mg total) by mouth once a week.  . senna-docusate (SENOKOT-S) 8.6-50 MG tablet Take 2 tablets by mouth at bedtime. For AFTER surgery, do not take if having diarrhea  . Sulfacetamide Sodium 10 % CREA Apply topically at bedtime.    . [DISCONTINUED] enoxaparin (LOVENOX) 40 MG/0.4ML injection Inject 0.4 mLs (40 mg total) into the skin daily for 14 doses. For AFTER surgery only   No facility-administered encounter  medications on file as of 02/01/2019.     Allergy:  Allergies  Allergen Reactions  . Fentanyl Palpitations    Palpitations , fever ,headahe, N/V  " loud pounding heart beat"   . Oxybutynin Chloride Other (See Comments)    Ditropan  Pt doesn't remember   . Thimerosal Other (See Comments)    Pt doesn't remember  . Tramadol Other (See Comments)    Pt doesn't remember  . Avelox [Moxifloxacin Hcl In Nacl] Rash  . Doxycycline Rash  . Erythromycin Rash  . Metronidazole Rash  . Minocycline Rash  . Penicillins Rash  . Quinolones Rash  . Sulfonamide Derivatives Rash    Social Hx:    Social History   Socioeconomic History  . Marital status: Married    Spouse name: Antony Haste  . Number of children: 0  . Years of education: Not on file  . Highest education level: Not on file  Occupational History  . Occupation: Teacher, early years/pre: [employer]  . Financial resource strain: Not on file  . Food insecurity    Worry: Not on file    Inability: Not on file  . Transportation needs    Medical: Not on file    Non-medical: Not on file  Tobacco Use  . Smoking status: Never Smoker  . Smokeless tobacco: Never Used  Substance and Sexual Activity  . Alcohol use: Yes    Alcohol/week: 2.0 standard drinks    Types: 2 Standard drinks or equivalent per week    Comment: Social   . Drug use: No  . Sexual activity: Not Currently    Birth control/protection: Post-menopausal    Comment: 1st intercourse 68 yo- More than 5 partners  Lifestyle  . Physical activity    Days per week: Not on file    Minutes per session: Not on file  . Stress: Not on file  Relationships  . Social Herbalist on phone: Not on file    Gets together: Not on file    Attends religious service: Not on file    Active member of club or organization: Not on file    Attends meetings of clubs or organizations: Not on file    Relationship status: Not on file  . Intimate partner violence    Fear of current or ex partner: Not on file    Emotionally abused: Not on file    Physically abused: Not on file    Forced sexual activity: Not on file  Other Topics Concern  . Not on file  Social History Narrative  . Not on file    Past Surgical Hx:  Past Surgical History:  Procedure Laterality Date  . BREAST LUMPECTOMY  1998   right breast with radiation  . CERVICAL DISCECTOMY     C3-5 with bone graft  . GYNECOLOGIC CRYOSURGERY  1980  . IR IMAGING GUIDED PORT INSERTION  12/24/2018  . NECK SURGERY  [phone]   bone graft and fusion  . REFRACTIVE SURGERY     narrow angle  glucoma  . RHINOPLASTY  1976  . TUBAL LIGATION  1977    Past Medical Hx:  Past Medical History:  Diagnosis Date  . Allergic rhinitis   . Cervical dysplasia 1980  . Cervicalgia   . Eczema    Rosacea,dermatitis-Dr [employer]  . Esophageal reflux   . Fibromyalgia   . GERD (gastroesophageal reflux disease)   . Glaucoma, narrow-angle   . Heart murmur   .  Hiatal hernia   . Irritable bowel syndrome   . LBBB (left bundle branch block)   . Left bundle branch block   . 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 Mitral valve disorders(424.0)    No antibiotics required  . Osteoporosis 12/2016   T score -2.3, 2014 T score -2.5 AP spine  . PAC (premature atrial contraction) 2012   ,PVC's, and nonsustained atril tachycardia w aberration by heart monitor   . PVC (premature ventricular contraction)   . Stricture and stenosis of esophagus     Past Gynecological History: Menarche at age 14 menopause late 36s nulliparous bilateral tubal ligation in her 15s.  History of abnormal Pap test treated with cryotherapy negative Pap test since  Family Hx:  Family History  Problem Relation Age of Onset  . Heart disease Mother   . Depression Mother   . Hypertension Mother   . Heart disease Father   . Hypertension Father   . Aneurysm Father   . Uterine cancer Sister   . Hypertension Sister   . Ovarian cancer Sister 44  . Melanoma Sister   . Heart disease Sister   . Alcohol abuse Sister   . Depression Sister   . Melanoma Sister   . Heart disease Paternal Grandfather   . Stroke Maternal Grandmother   . Cancer Maternal Grandfather        Pancreatic  . Stroke Paternal Grandmother   . Cancer Paternal Uncle        multiple myeloma  . Cancer Paternal Uncle        prostate cancer  . Cancer Paternal Uncle        melanoma  . Colon cancer Neg Hx   Sister #1 ovarian cancer diagnosed at age 64 vulvar melanoma  diagnosed at age 25 Sister #2 melanoma in situ Sister #3 cervical cancer Paternal uncle pancreatic cancer multiple myeloma and melanoma  Vitals:  Blood pressure 129/68, pulse (!) 106, temperature 98 F (36.7 C), temperature source Temporal, resp. rate 17, height 5' 5" (1.651 m), weight 107 lb (48.5 kg), SpO2 100 %.  Physical Exam: WD in NAD Neck  Supple NROM, without any enlargements.  Lymph Node Survey No cervical supraclavicular or inguinal adenopathy Cardiovascular  Pulse normal rate, regularity and rhythm.  Lungs  Clear to auscultation bilaterally, breath sounds present at the bases .  Skin  No rash/lesions/breakdown  Psychiatry  Alert and oriented appropriate mood affect speech and reasoning. Abdomen  Normoactive bowel sounds, abdomen soft, non-tender, no masses or distension Back No CVA tenderness Genito Urinary  Vulva/vagina: Normal external female genitalia.  No lesions. No discharge or bleeding.  Bladder/urethra:  No lesions or masses  Vagina: Atrophic no discharge no bleeding cervix: Normal nulliparous appearing, no lesions.  Uterus: Mobile, small no parametrial involvement   Adnexa: No central nodularity fixed Rectal  Good tone, no masses unable to appreciated the previously felt left-sided nodularity palpated on the anterior rectal wall  Extremities  No bilateral cyanosis, clubbing or edema.   30 minutes was spent with the patient including >50% of direct face to face counseling timet. This included discussion about prognosis, therapy recommendations and postoperative side effects.  Thereasa Solo, MD, PhD 02/01/2019, 5:39 PM

## 2019-02-01 NOTE — Patient Instructions (Addendum)
Preparing for your Surgery  Plan for surgery on February 17, 2019 with Dr. Everitt Amber at [place] will be scheduled for a robotic assisted total laparoscopic hysterectomy, bilateral salpingo-oophorectomy, omentectomy, possible debulking, possible laparotomy.   Pre-operative Testing -You will receive a phone call from presurgical testing at [facility] if you have not received a call already to arrange for a pre-operative testing appointment before your surgery.  This appointment normally occurs one to two weeks before your scheduled surgery.   -Bring your insurance card, copy of an advanced directive if applicable, medication list  -At that visit, you will be asked to sign a consent for a possible blood transfusion in case a transfusion becomes necessary during surgery.  The need for a blood transfusion is rare but having consent is a necessary part of your care.     -You should not be taking blood thinners or aspirin at least ten days prior to surgery unless instructed by your surgeon.  -Do not take supplements such as fish oil (omega 3), red yeast rice, tumeric before your surgery.  Day Before Surgery at [name] will be asked to take in a light diet the day before surgery.  Avoid carbonated beverages.  You will be advised to have nothing to eat or drink after midnight the evening before.    Eat a light diet the day before surgery.  Examples including soups, broths, toast, yogurt, mashed potatoes.  Things to avoid include carbonated beverages (fizzy beverages), raw fruits and raw vegetables, or beans.   If your bowels are filled with gas, your surgeon will have difficulty visualizing your pelvic organs which increases your surgical risks.  Your role in recovery Your role is to become active as soon as directed by your doctor, while still giving yourself time to heal.  Rest when you feel tired. You will be asked to do the following in order to speed your recovery:   - Cough and breathe deeply. This helps toclear and expand your lungs and can prevent pneumonia.  - Do mild physical activity. Walking or moving your legs help your circulation and body functions return to normal. A staff member will help you when you try to walk and will provide you with simple exercises. Do not try to get up or walk alone the first time. - Actively manage your pain. Managing your pain lets you move in comfort. We will ask you to rate your pain on a scale of zero to 10. It is your responsibility to tell your doctor or nurse where and how much you hurt so your pain can be treated.  Special Considerations -If you are diabetic, you may be placed on insulin after surgery to have closer control over your blood sugars to promote healing and recovery.  This does not mean that you will be discharged on insulin.  If applicable, your oral antidiabetics will be resumed when you are tolerating a solid diet.  -Your final pathology results from surgery should be available around one week after surgery and the results will be relayed to you when available.  -Dr. Lahoma Crocker is the surgeon that assists your GYN Oncologist with surgery.  If you end up staying the night, the next day after your surgery you will either see Dr. Denman George or Dr. Lahoma Crocker.  -FMLA forms can be faxed to [phone] and please allow 5-7 business days for completion.  Pain Management After Surgery -You have been prescribed your pain medication and bowel  regimen medications before surgery so that you can have these available when you are discharged from the hospital. The pain medication is for use ONLY AFTER surgery and a new prescription will not be given.   -Make sure that you have Tylenol and Ibuprofen at home to use on a regular basis after surgery for pain control. We recommend alternating the medications every hour to six hours since they work differently and are processed in the body differently for pain  relief.  -Review the attached handout on narcotic use and their risks and side effects.   Bowel Regimen -You have been prescribed Sennakot-S to take nightly to prevent constipation especially if you are taking the narcotic pain medication intermittently.  It is important to prevent constipation and drink adequate amounts of liquids.  Blood Transfusion Information WHAT IS A BLOOD TRANSFUSION? A transfusion is the replacement of blood or some of its parts. Blood is made up of multiple cells which provide different functions.  Red blood cells carry oxygen and are used for blood loss replacement.  White blood cells fight against infection.  Platelets control bleeding.  Plasma helps clot blood.  Other blood products are available for specialized needs, such as hemophilia or other clotting disorders. BEFORE THE TRANSFUSION  Who gives blood for transfusions?   You may be able to donate blood to be used at a later date on yourself (autologous donation).  Relatives can be asked to donate blood. This is generally not any safer than if you have received blood from a stranger. The same precautions are taken to ensure safety when a relative's blood is donated.  Healthy volunteers who are fully evaluated to make sure their blood is safe. This is blood bank blood. Transfusion therapy is the safest it has ever been in the practice of medicine. Before blood is taken from a donor, a complete history is taken to make sure that person has no history of diseases nor engages in risky social behavior (examples are intravenous drug use or sexual activity with multiple partners). The donor's travel history is screened to minimize risk of transmitting infections, such as malaria. The donated blood is tested for signs of infectious diseases, such as HIV and hepatitis. The blood is then tested to be sure it is compatible with you in order to minimize the chance of a transfusion reaction. If you or a relative donates  blood, this is often done in anticipation of surgery and is not appropriate for emergency situations. It takes many days to process the donated blood. RISKS AND COMPLICATIONS Although transfusion therapy is very safe and saves many lives, the main dangers of transfusion include:   Getting an infectious disease.  Developing a transfusion reaction. This is an allergic reaction to something in the blood you were given. Every precaution is taken to prevent this. The decision to have a blood transfusion has been considered carefully by your caregiver before blood is given. Blood is not given unless the benefits outweigh the risks.  AFTER SURGERY INSTRUCTIONS 02/01/2019  Return to work: 4-6 weeks if applicable  Activity: 1. Be up and out of the bed during the day.  Take a nap if needed.  You may walk up steps but be careful and use the hand rail.  Stair climbing will tire you more than you think, you may need to stop part way and rest.   2. No lifting or straining for 6 weeks.  3. No driving for 1 week(s).  Do not drive if  you are taking narcotic pain medicine.  4. Shower daily.  Use soap and water on your incision and pat dry; don't rub.  No tub baths until cleared by your surgeon.   5. No sexual activity and nothing in the vagina for 8 weeks.  6. You may experience a small amount of clear drainage from your incisions, which is normal.  If the drainage persists or increases, please call the office.  7. You may experience vaginal spotting after surgery or around the 6-8 week mark from surgery when the stitches at the top of the vagina begin to dissolve.  The spotting is normal but if you experience heavy bleeding, call our office.  8. Take Tylenol or ibuprofen first for pain and only use narcotic pain medication for severe pain not relieved by the Tylenol or Ibuprofen.  Monitor your Tylenol intake to a max of 4,000 mg.  Diet: 1. Low sodium Heart Healthy Diet is recommended.  2. It is safe  to use a laxative, such as Miralax or Colace, if you have difficulty moving your bowels. You can take Sennakot at bedtime every evening to keep bowel movements regular and to prevent constipation.    Wound Care: 1. Keep clean and dry.  Shower daily.  Reasons to call the Doctor:  Fever - Oral temperature greater than 100.4 degrees Fahrenheit  Foul-smelling vaginal discharge  Difficulty urinating  Nausea and vomiting  Increased pain at the site of the incision that is unrelieved with pain medicine.  Difficulty breathing with or without chest pain  New calf pain especially if only on one side  Sudden, continuing increased vaginal bleeding with or without clots.   Contacts: For questions or concerns you should contact:  Dr. Everitt Amber at [phone]  Joylene John, NP at [phone]  After Hours: call [phone] and have the GYN Oncologist paged/contacted  Enoxaparin injection What is this medicine? ENOXAPARIN (ee nox a PA rin) is used after knee, hip, or abdominal surgeries to prevent blood clotting. It is also used to treat existing blood clots in the lungs or in the veins. This medicine may be used for other purposes; ask your health care provider or pharmacist if you have questions. COMMON BRAND NAME(S): Lovenox What should I tell my health care provider before I take this medicine? They need to know if you have any of these conditions:  bleeding disorders, hemorrhage, or hemophilia  infection of the heart or heart valves  kidney or liver disease  previous stroke  prosthetic heart valve  recent surgery or delivery of a baby  ulcer in the stomach or intestine, diverticulitis, or other bowel disease  an unusual or allergic reaction to enoxaparin, heparin, pork or pork products, other medicines, foods, dyes, or preservatives  pregnant or trying to get pregnant  breast-feeding How should I use this medicine? This medicine is for injection under the skin. It  is usually given by a health-care professional. You or a family member may be trained on how to give the injections. If you are to give yourself injections, make sure you understand how to use the syringe, measure the dose if necessary, and give the injection. To avoid bruising, do not rub the site where this medicine has been injected. Do not take your medicine more often than directed. Do not stop taking except on the advice of your doctor or health care professional. Make sure you receive a puncture-resistant container to dispose of the needles and syringes once you have finished with them.  Do not reuse these items. Return the container to your doctor or health care professional for proper disposal. Talk to your pediatrician regarding the use of this medicine in children. Special care may be needed. Overdosage: If you think you have taken too much of this medicine contact a poison control center or emergency room at once. NOTE: This medicine is only for you. Do not share this medicine with others. What if I miss a dose? If you miss a dose, take it as soon as you can. If it is almost time for your next dose, take only that dose. Do not take double or extra doses. What may interact with this medicine?  aspirin and aspirin-like medicines  certain medicines that treat or prevent blood clots  dipyridamole  NSAIDs, medicines for pain and inflammation, like ibuprofen or naproxen This list may not describe all possible interactions. Give your health care provider a list of all the medicines, herbs, non-prescription drugs, or dietary supplements you use. Also tell them if you smoke, drink alcohol, or use illegal drugs. Some items may interact with your medicine. What should I watch for while using this medicine? Visit your healthcare professional for regular checks on your progress. You may need blood work done while you are taking this medicine. Your condition will be monitored carefully while you are  receiving this medicine. It is important not to miss any appointments. If you are going to need surgery or other procedure, tell your healthcare professional that you are using this medicine. Using this medicine for a long time may weaken your bones and increase the risk of bone fractures. Avoid sports and activities that might cause injury while you are using this medicine. Severe falls or injuries can cause unseen bleeding. Be careful when using sharp tools or knives. Consider using an Copy. Take special care brushing or flossing your teeth. Report any injuries, bruising, or red spots on the skin to your healthcare professional. Wear a medical ID bracelet or chain. Carry a card that describes your disease and details of your medicine and dosage times. What side effects may I notice from receiving this medicine? Side effects that you should report to your doctor or health care professional as soon as possible:  allergic reactions like skin rash, itching or hives, swelling of the face, lips, or tongue  bone pain  signs and symptoms of bleeding such as bloody or black, tarry stools; red or dark-brown urine; spitting up blood or brown material that looks like coffee grounds; red spots on the skin; unusual bruising or bleeding from the eye, gums, or nose  signs and symptoms of a blood clot such as chest pain; shortness of breath; pain, swelling, or warmth in the leg  signs and symptoms of a stroke such as changes in vision; confusion; trouble speaking or understanding; severe headaches; sudden numbness or weakness of the face, arm or leg; trouble walking; dizziness; loss of coordination Side effects that usually do not require medical attention (report to your doctor or health care professional if they continue or are bothersome):  hair loss  pain, redness, or irritation at site where injected This list may not describe all possible side effects. Call your doctor for medical advice about  side effects. You may report side effects to FDA at 1-800-FDA-1088. Where should I keep my medicine? Keep out of the reach of children. Store at room temperature between 15 and 30 degrees C (59 and 86 degrees F). Do not freeze. If your injections  have been specially prepared, you may need to store them in the refrigerator. Ask your pharmacist. Throw away any unused medicine after the expiration date. NOTE: This sheet is a summary. It may not cover all possible information. If you have questions about this medicine, talk to your doctor, pharmacist, or health care provider.  2020 [employer] Standard (2017-02-26 11:25:34)

## 2019-02-01 NOTE — Progress Notes (Signed)
Follow-up Note: Gyn-Onc  Consult was initially requested by Dr. Olen Pel for the evaluation of [name] [name] 68 y.o. female  CC: Ascites, pelvic mass  Assessment/Plan:  [name] is a 68 y.o. with a history of stage IIIC cancer, s/p 3 cycles of neoadjuvant chemotherapy.  She has had a good partial response to 3 cycles of chemotherapy. Today we reviewed her CT from 01/31/19.   I am recommending interval cytoreductive surgery.  After my evaluation of her scans, and physical examination, I think she is a good candidate for robotic assisted total hysterectomy with BSO, omentectomy, possible tumor debulking.  I explained in detail the procedure, the potential need for mini laparotomy laparotomy, the potential risks associated with such procedure.  I explained that surgery is recommended in conjunction with ovarian cancer chemotherapy in order to obtain best outcomes and likelihood of either cure and during remission.  I explained surgical risks including  bleeding, infection, damage to internal organs (such as bladder,ureters, bowels), blood clot, reoperation and rehospitalization. I explained that her increased risk for VTE perioperatively due to ovarian cancer surgery would be medicated with 2 weeks of postoperative Lovenox.  She is very concerned about opioid use postop and we counseled her extensively about use of anti-inflammatory medications to avoid this.  She had history of neck surgery and cannot hyperextend her neck, I counseled her to inform anesthesia of this on the day of surgery as they may require fiberoptic or glide scope to achieve intubation.  I counseled her that after surgery and a 3 to 4-week recovery.  She would likely then transition to her final 3 cycles of chemotherapy.  I explained that she will need to have genetic testing of both germline and somatic to rule out BRCA mutations or HRD.  If these are present she would be a candidate for maintenance PARP  inhibitor therapy.   HPI: [name] is a 68 y.o.   nulliparous female who reported menopause in her late 37s.  She reported a 6-year history of fibromyalgia.  She was in her usual state of health until the spring/summer of 2020 when she noticed increased abdominal girth and some dyspnea upon exertion.  After Friday, September 4 she noted poor appetite and a rather significant increase in the abdominal distention.  She reported heartburn and intermittent nausea.  There had been no rectal or vaginal bleeding but she did report intermittent diarrhea.  [name] [name] was evaluated  in the ED for the complaint of abdominal distention on November 23, 2018.  A CT scan was notable for a slightly irregular liver contour.  Significant perihepatic ascites.  There was vague ill-defined enhancing appearing peritoneal lesions in the mesentery worrisome for peritoneal carcinomatosis.  The uterus and left ovary appeared normal but there was an ill-defined irregular enhancing soft tissue noted in the region of the right adnexa without discrete masses.  Large volume abdominal pelvic ascites was noted.  Ca1 25 was collected and returned at a value of 253.  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  Her most recent mammogram was in June 2020 and was  within normal limits, her  last colonoscopy was in October 2014 and also was  within normal limits  [name] [name]'s  family history is notable for a sister diagnosed with multiple primaries ovarian cancer the age of 82 and vulva melanoma  at the age of 82.  A second sister was diagnosed with melanoma in situ.  A paternal uncle was diagnosed with multiple primaries namely pancreatic cancer multiple myeloma and melanoma.  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  She was diagnosed with stage 3C cancer and was recommended neoadjuvant chemotherapy for 3 cycles followed by interval cytoreductive surgery.   Chemotherapy with carboplatin and paclitaxel began on 12/06/2018.  Day 1 of cycle 3 was administered on 01/17/2019.   CA 125 on 01/17/19 was 57.2.  CT abd/pelvis on 01/31/19 showed significant decrease in peritoneal carcinomatosis since previous study. Interval resolution of ascites. Focal area of parenchymal consolidation in central right middle lobe, which measures 3 cm. Differential diagnosis includes infectious or inflammatory process, atelectasis, and neoplasm.  Review of Systems: Constitutional  Feels well,  Cardiovascular  No chest pain, shortness of breath, or edema  Pulmonary  No cough or wheeze.  Reports shallow breaths because of abdominal distention sleeps on one pillow Gastro Intestinal  Mittens nausea, vomitting, remittent diarrhoea. No bright red blood per rectum, distention that causes abdominal pain, no change in bowel movement, or constipation.  Genito Urinary  No frequency, urgency, dysuria, no vaginal bleeding musculo Skeletal  Intermittent myalgia, arthralgia, joint swelling and pain with flareups of fibromyalgia neurologic  No weakness, numbness, change in gait,  Psychology  No depression, anxiety, insomnia.    Current Meds:  Outpatient Encounter Medications as of 02/01/2019  Medication Sig  . Azelaic Acid (FINACEA EX) Apply topically daily.    . Calcium Carbonate-Vitamin D (CALTRATE 600+D) 600-400 MG-UNIT per tablet Take 1 tablet by mouth daily.   . cromolyn (NASALCROM) 5.2 MG/ACT nasal spray Place 1 spray into the nose as needed.   Marland Kitchen dexamethasone (DECADRON) 4 MG tablet Take 2 tabs at the night before and 2 tab the morning of chemotherapy, every 3 weeks, by mouth  . [START ON 02/18/2019] enoxaparin (LOVENOX) 40 MG/0.4ML injection Inject 0.4 mLs (40 mg total) into the  skin daily for 14 doses. For AFTER surgery only  . gabapentin (NEURONTIN) 300 MG capsule Take 600 mg by mouth 3 (three) times daily.   Marland Kitchen lidocaine-prilocaine (EMLA) cream Apply to affected area once  . magnesium oxide (MAG-OX) 400 MG tablet Take 400 mg by mouth daily as needed.  . meloxicam (MOBIC) 7.5 MG tablet Take 7.5 mg by mouth 2 (two) times daily as needed.  . mupirocin ointment (BACTROBAN) 2 % Apply 1 application topically as needed.  . ondansetron (ZOFRAN) 8 MG tablet Take 1 tablet (8 mg total) by mouth every 8 (eight) hours as needed for refractory nausea / vomiting.  Marland Kitchen oxyCODONE (OXY IR/ROXICODONE) 5 MG immediate release tablet Take 1 tablet (5 mg total) by mouth every 4 (four) hours as needed for severe pain.  Marland Kitchen prochlorperazine (COMPAZINE) 10 MG tablet Take 1 tablet (10 mg total) by mouth every 6 (six) hours as needed (Nausea or vomiting).  . Risedronate Sodium 35 MG TBEC Take 1 tablet (35 mg total) by mouth once a week.  . senna-docusate (SENOKOT-S) 8.6-50 MG tablet Take 2 tablets by mouth at bedtime. For AFTER surgery, do not take if having diarrhea  . Sulfacetamide Sodium 10 % CREA Apply topically at bedtime.    . [DISCONTINUED] enoxaparin (LOVENOX) 40 MG/0.4ML injection Inject 0.4 mLs (40 mg total) into the skin daily for 14 doses. For AFTER surgery only   No facility-administered encounter  medications on file as of 02/01/2019.     Allergy:  Allergies  Allergen Reactions  . Fentanyl Palpitations    Palpitations , fever ,headahe, N/V  " loud pounding heart beat"   . Oxybutynin Chloride Other (See Comments)    Ditropan  Pt doesn't remember   . Thimerosal Other (See Comments)    Pt doesn't remember  . Tramadol Other (See Comments)    Pt doesn't remember  . Avelox [Moxifloxacin Hcl In Nacl] Rash  . Doxycycline Rash  . Erythromycin Rash  . Metronidazole Rash  . Minocycline Rash  . Penicillins Rash  . Quinolones Rash  . Sulfonamide Derivatives Rash    Social Hx:    Social History   Socioeconomic History  . Marital status: Married    Spouse name: Antony Haste  . Number of children: 0  . Years of education: Not on file  . Highest education level: Not on file  Occupational History  . Occupation: Teacher, early years/pre: [employer]  . Financial resource strain: Not on file  . Food insecurity    Worry: Not on file    Inability: Not on file  . Transportation needs    Medical: Not on file    Non-medical: Not on file  Tobacco Use  . Smoking status: Never Smoker  . Smokeless tobacco: Never Used  Substance and Sexual Activity  . Alcohol use: Yes    Alcohol/week: 2.0 standard drinks    Types: 2 Standard drinks or equivalent per week    Comment: Social   . Drug use: No  . Sexual activity: Not Currently    Birth control/protection: Post-menopausal    Comment: 1st intercourse 68 yo- More than 5 partners  Lifestyle  . Physical activity    Days per week: Not on file    Minutes per session: Not on file  . Stress: Not on file  Relationships  . Social Herbalist on phone: Not on file    Gets together: Not on file    Attends religious service: Not on file    Active member of club or organization: Not on file    Attends meetings of clubs or organizations: Not on file    Relationship status: Not on file  . Intimate partner violence    Fear of current or ex partner: Not on file    Emotionally abused: Not on file    Physically abused: Not on file    Forced sexual activity: Not on file  Other Topics Concern  . Not on file  Social History Narrative  . Not on file    Past Surgical Hx:  Past Surgical History:  Procedure Laterality Date  . BREAST LUMPECTOMY  1998   right breast with radiation  . CERVICAL DISCECTOMY     C3-5 with bone graft  . GYNECOLOGIC CRYOSURGERY  1980  . IR IMAGING GUIDED PORT INSERTION  12/24/2018  . NECK SURGERY  [phone]   bone graft and fusion  . REFRACTIVE SURGERY     narrow angle  glucoma  . RHINOPLASTY  1976  . TUBAL LIGATION  1977    Past Medical Hx:  Past Medical History:  Diagnosis Date  . Allergic rhinitis   . Cervical dysplasia 1980  . Cervicalgia   . Eczema    Rosacea,dermatitis-Dr [employer]  . Esophageal reflux   . Fibromyalgia   . GERD (gastroesophageal reflux disease)   . Glaucoma, narrow-angle   . Heart murmur   .  Hiatal hernia   . Irritable bowel syndrome   . LBBB (left bundle branch block)   . Left bundle branch block   . 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 Mitral valve disorders(424.0)    No antibiotics required  . Osteoporosis 12/2016   T score -2.3, 2014 T score -2.5 AP spine  . PAC (premature atrial contraction) 2012   ,PVC's, and nonsustained atril tachycardia w aberration by heart monitor   . PVC (premature ventricular contraction)   . Stricture and stenosis of esophagus     Past Gynecological History: Menarche at age 14 menopause late 36s nulliparous bilateral tubal ligation in her 15s.  History of abnormal Pap test treated with cryotherapy negative Pap test since  Family Hx:  Family History  Problem Relation Age of Onset  . Heart disease Mother   . Depression Mother   . Hypertension Mother   . Heart disease Father   . Hypertension Father   . Aneurysm Father   . Uterine cancer Sister   . Hypertension Sister   . Ovarian cancer Sister 44  . Melanoma Sister   . Heart disease Sister   . Alcohol abuse Sister   . Depression Sister   . Melanoma Sister   . Heart disease Paternal Grandfather   . Stroke Maternal Grandmother   . Cancer Maternal Grandfather        Pancreatic  . Stroke Paternal Grandmother   . Cancer Paternal Uncle        multiple myeloma  . Cancer Paternal Uncle        prostate cancer  . Cancer Paternal Uncle        melanoma  . Colon cancer Neg Hx   Sister #1 ovarian cancer diagnosed at age 64 vulvar melanoma  diagnosed at age 25 Sister #2 melanoma in situ Sister #3 cervical cancer Paternal uncle pancreatic cancer multiple myeloma and melanoma  Vitals:  Blood pressure 129/68, pulse (!) 106, temperature 98 F (36.7 C), temperature source Temporal, resp. rate 17, height 5' 5" (1.651 m), weight 107 lb (48.5 kg), SpO2 100 %.  Physical Exam: WD in NAD Neck  Supple NROM, without any enlargements.  Lymph Node Survey No cervical supraclavicular or inguinal adenopathy Cardiovascular  Pulse normal rate, regularity and rhythm.  Lungs  Clear to auscultation bilaterally, breath sounds present at the bases .  Skin  No rash/lesions/breakdown  Psychiatry  Alert and oriented appropriate mood affect speech and reasoning. Abdomen  Normoactive bowel sounds, abdomen soft, non-tender, no masses or distension Back No CVA tenderness Genito Urinary  Vulva/vagina: Normal external female genitalia.  No lesions. No discharge or bleeding.  Bladder/urethra:  No lesions or masses  Vagina: Atrophic no discharge no bleeding cervix: Normal nulliparous appearing, no lesions.  Uterus: Mobile, small no parametrial involvement   Adnexa: No central nodularity fixed Rectal  Good tone, no masses unable to appreciated the previously felt left-sided nodularity palpated on the anterior rectal wall  Extremities  No bilateral cyanosis, clubbing or edema.   30 minutes was spent with the patient including >50% of direct face to face counseling timet. This included discussion about prognosis, therapy recommendations and postoperative side effects.  Thereasa Solo, MD, PhD 02/01/2019, 5:39 PM

## 2019-02-02 ENCOUNTER — Telehealth: Payer: Self-pay | Admitting: *Deleted

## 2019-02-02 ENCOUNTER — Other Ambulatory Visit: Payer: Self-pay | Admitting: Gynecologic Oncology

## 2019-02-02 DIAGNOSIS — C482 Malignant neoplasm of peritoneum, unspecified: Secondary | ICD-10-CM

## 2019-02-02 NOTE — Telephone Encounter (Signed)
Patient called and stated "after Melissa and I talked yesterday, I can home and reviewed things. I have a couple questions and things I want to tell her." Message forwarded to [facility] APP

## 2019-02-04 ENCOUNTER — Encounter: Payer: Self-pay | Admitting: Gynecologic Oncology

## 2019-02-04 ENCOUNTER — Telehealth: Payer: Self-pay | Admitting: Gynecologic Oncology

## 2019-02-04 NOTE — Telephone Encounter (Signed)
Patient wouls like a call from [facility] APP, she has some questions regarding her surgery

## 2019-02-04 NOTE — Telephone Encounter (Signed)
Returned call to patient.  All questions answered.  Dr. Alvy Bimler informed of patient's concerns about finger tip numbness and toe numbness.  Discussed the role of genetic testing and advised she would be referred in the near future.  Advised her to call for any questions or concerns.

## 2019-02-07 ENCOUNTER — Other Ambulatory Visit: Payer: Medicare Other

## 2019-02-07 ENCOUNTER — Ambulatory Visit: Payer: Medicare Other | Admitting: Hematology and Oncology

## 2019-02-07 ENCOUNTER — Ambulatory Visit: Payer: Medicare Other

## 2019-02-08 NOTE — Patient Instructions (Addendum)
DUE TO COVID-19 ONLY ONE VISITOR IS ALLOWED TO COME WITH YOU AND STAY IN THE WAITING ROOM ONLY DURING PRE OP AND PROCEDURE DAY OF SURGERY. THE 1 VISITOR MAY VISIT WITH YOU AFTER SURGERY IN YOUR PRIVATE ROOM DURING VISITING HOURS ONLY!  YOU NEED TO HAVE A COVID 19 TEST ON_Monday 11/30/2020______ @_______ , THIS TEST MUST BE DONE BEFORE SURGERY, COME  [place] , [id number].  ([place]) ONCE YOUR COVID TEST IS COMPLETED, PLEASE BEGIN THE QUARANTINE INSTRUCTIONS AS OUTLINED IN YOUR HANDOUT.                [name] [name]    Your procedure is scheduled on: Thursday 02/17/2019   Report to [facility] Main  Entrance    Report to admitting at 8:00 am      Call this number if you have problems the morning of surgery [phone]    Remember: Do not eat food or drink after Midnight.   BRUSH YOUR TEETH MORNING OF SURGERY AND RINSE YOUR MOUTH OUT, NO CHEWING GUM CANDY OR MINTS.  Eat a light diet the day before surgery.  Examples including soups, broths, toast, yogurt, mashed potatoes.  Things to avoid include carbonated beverages (fizzy beverages), raw fruits and raw vegetables, or beans.   If your bowels are filled with gas, your surgeon will have difficulty visualizing your pelvic organs which increases your surgical risks.   CLEAR LIQUID DIET   Foods Allowed                                                                     Foods Excluded  Coffee and tea, regular and decaf                             liquids that you cannot  Plain Jell-O any favor except red or purple             see through such as: Fruit ices (not with fruit pulp)                                     milk, soups, orange juice  Iced Popsicles                                    All solid food                                 Cranberry, grape and apple juices Sports drinks like Gatorade Lightly seasoned clear broth or consume(fat free) Sugar, honey syrup  Sample  Menu Breakfast                                Lunch                                     Supper  Cranberry juice                    Beef broth                            Chicken broth Jell-O                                     Grape juice                           Apple juice Coffee or tea                        Jell-O                                      Popsicle                                                Coffee or tea                        Coffee or tea  _____________________________________________________________________       Take these medicines the morning of surgery with A SIP OF WATER: Gabapentin (Neurontin), Loratadine (Claritin), Eye drops if needed.                                 You may not have any metal on your body including hair pins and              piercings  Do not wear jewelry, make-up, lotions, powders or perfumes, deodorant             Do not wear nail polish on your fingernails.  Do not shave  48 hours prior to surgery.                Do not bring valuables to the hospital. [place].  Contacts, dentures or bridgework may not be worn into surgery.  Leave suitcase in the car. After surgery it may be brought to your room.     Patients discharged the day of surgery will not be allowed to drive home. IF YOU ARE HAVING SURGERY AND GOING HOME THE SAME DAY, YOU MUST HAVE AN ADULT TO DRIVE YOU HOME AND  BE WITH YOU FOR 24 HOURS. YOU MAY GO HOME BY TAXI OR UBER OR ORTHERWISE, BUT AN ADULT MUST ACCOMPANY YOU HOME AND STAY WITH YOU FOR 24 HOURS.    Name and phone number of your driver: Husband [name] [name] [phone]    Special Instructions: N/A              Please read over the following fact sheets you were given: _____________________________________________________________________             [facility] - Preparing for Surgery Before surgery, you can play an important role.  Because skin is  not sterile,  your skin needs to be as free of germs as possible.  You can reduce the number of germs on your skin by washing with CHG (chlorahexidine gluconate) soap before surgery.  CHG is an antiseptic cleaner which kills germs and bonds with the skin to continue killing germs even after washing. Please DO NOT use if you have an allergy to CHG or antibacterial soaps.  If your skin becomes reddened/irritated stop using the CHG and inform your nurse when you arrive at Short Stay. Do not shave (including legs and underarms) for at least 48 hours prior to the first CHG shower.  You may shave your face/neck. Please follow these instructions carefully:  1.  Shower with CHG Soap the night before surgery and the  morning of Surgery.  2.  If you choose to wash your hair, wash your hair first as usual with your  normal  shampoo.  3.  After you shampoo, rinse your hair and body thoroughly to remove the  shampoo.                            4.  Use CHG as you would any other liquid soap.  You can apply chg directly  to the skin and wash                       Gently with a scrungie or clean washcloth.  5.  Apply the CHG Soap to your body ONLY FROM THE NECK DOWN.   Do not use on face/ open                           Wound or open sores. Avoid contact with eyes, ears mouth and genitals (private parts).                       Wash face,  Genitals (private parts) with your normal soap.             6.  Wash thoroughly, paying special attention to the area where your surgery  will be performed.  7.  Thoroughly rinse your body with warm water from the neck down.  8.  DO NOT shower/wash with your normal soap after using and rinsing off  the CHG Soap.                9.  Pat yourself dry with a clean towel.            10.  Wear clean pajamas.            11.  Place clean sheets on your bed the night of your first shower and do not  sleep with pets. Day of Surgery : Do not apply any lotions/deodorants the morning of surgery.  Please  wear clean clothes to the hospital/surgery center.  FAILURE TO FOLLOW THESE INSTRUCTIONS MAY RESULT IN THE CANCELLATION OF YOUR SURGERY PATIENT SIGNATURE_________________________________  NURSE SIGNATURE__________________________________  ________________________________________________________________________   Adam Phenix  An incentive spirometer is a tool that can help keep your lungs clear and active. This tool measures how well you are filling your lungs with each breath. Taking long deep breaths may help reverse or decrease the chance of developing breathing (pulmonary) problems (especially infection) following:  A long period of time when you are unable to move or be active. BEFORE THE PROCEDURE   If the  spirometer includes an indicator to show your best effort, your nurse or respiratory therapist will set it to a desired goal.  If possible, sit up straight or lean slightly forward. Try not to slouch.  Hold the incentive spirometer in an upright position. INSTRUCTIONS FOR USE  1. Sit on the edge of your bed if possible, or sit up as far as you can in bed or on a chair. 2. Hold the incentive spirometer in an upright position. 3. Breathe out normally. 4. Place the mouthpiece in your mouth and seal your lips tightly around it. 5. Breathe in slowly and as deeply as possible, raising the piston or the ball toward the top of the column. 6. Hold your breath for 3-5 seconds or for as long as possible. Allow the piston or ball to fall to the bottom of the column. 7. Remove the mouthpiece from your mouth and breathe out normally. 8. Rest for a few seconds and repeat Steps 1 through 7 at least 10 times every 1-2 hours when you are awake. Take your time and take a few normal breaths between deep breaths. 9. The spirometer may include an indicator to show your best effort. Use the indicator as a goal to work toward during each repetition. 10. After each set of 10 deep breaths,  practice coughing to be sure your lungs are clear. If you have an incision (the cut made at the time of surgery), support your incision when coughing by placing a pillow or rolled up towels firmly against it. Once you are able to get out of bed, walk around indoors and cough well. You may stop using the incentive spirometer when instructed by your caregiver.  RISKS AND COMPLICATIONS  Take your time so you do not get dizzy or light-headed.  If you are in pain, you may need to take or ask for pain medication before doing incentive spirometry. It is harder to take a deep breath if you are having pain. AFTER USE  Rest and breathe slowly and easily.  It can be helpful to keep track of a log of your progress. Your caregiver can provide you with a simple table to help with this. If you are using the spirometer at home, follow these instructions: [place] IF:   You are having difficultly using the spirometer.  You have trouble using the spirometer as often as instructed.  Your pain medication is not giving enough relief while using the spirometer.  You develop fever of 100.5 F (38.1 C) or higher. SEEK IMMEDIATE MEDICAL CARE IF:   You cough up bloody sputum that had not been present before.  You develop fever of 102 F (38.9 C) or greater.  You develop worsening pain at or near the incision site. MAKE SURE YOU:   Understand these instructions.  Will watch your condition.  Will get help right away if you are not doing well or get worse. Document Released: 07/14/2006 Document Revised: 05/26/2011 Document Reviewed: 09/14/2006 ExitCare Patient Information 2014 ExitCare, [place].   ________________________________________________________________________  WHAT IS A BLOOD TRANSFUSION? Blood Transfusion Information  A transfusion is the replacement of blood or some of its parts. Blood is made up of multiple cells which provide different functions.  Red blood cells carry oxygen  and are used for blood loss replacement.  White blood cells fight against infection.  Platelets control bleeding.  Plasma helps clot blood.  Other blood products are available for specialized needs, such as hemophilia or other clotting disorders. BEFORE  THE TRANSFUSION  Who gives blood for transfusions?   Healthy volunteers who are fully evaluated to make sure their blood is safe. This is blood bank blood. Transfusion therapy is the safest it has ever been in the practice of medicine. Before blood is taken from a donor, a complete history is taken to make sure that person has no history of diseases nor engages in risky social behavior (examples are intravenous drug use or sexual activity with multiple partners). The donor's travel history is screened to minimize risk of transmitting infections, such as malaria. The donated blood is tested for signs of infectious diseases, such as HIV and hepatitis. The blood is then tested to be sure it is compatible with you in order to minimize the chance of a transfusion reaction. If you or a relative donates blood, this is often done in anticipation of surgery and is not appropriate for emergency situations. It takes many days to process the donated blood. RISKS AND COMPLICATIONS Although transfusion therapy is very safe and saves many lives, the main dangers of transfusion include:   Getting an infectious disease.  Developing a transfusion reaction. This is an allergic reaction to something in the blood you were given. Every precaution is taken to prevent this. The decision to have a blood transfusion has been considered carefully by your caregiver before blood is given. Blood is not given unless the benefits outweigh the risks. AFTER THE TRANSFUSION  Right after receiving a blood transfusion, you will usually feel much better and more energetic. This is especially true if your red blood cells have gotten low (anemic). The transfusion raises the level of  the red blood cells which carry oxygen, and this usually causes an energy increase.  The nurse administering the transfusion will monitor you carefully for complications. HOME CARE INSTRUCTIONS  No special instructions are needed after a transfusion. You may find your energy is better. Speak with your caregiver about any limitations on activity for underlying diseases you may have. SEEK MEDICAL CARE IF:   Your condition is not improving after your transfusion.  You develop redness or irritation at the intravenous (IV) site. SEEK IMMEDIATE MEDICAL CARE IF:  Any of the following symptoms occur over the next 12 hours:  Shaking chills.  You have a temperature by mouth above 102 F (38.9 C), not controlled by medicine.  Chest, back, or muscle pain.  People around you feel you are not acting correctly or are confused.  Shortness of breath or difficulty breathing.  Dizziness and fainting.  You get a rash or develop hives.  You have a decrease in urine output.  Your urine turns a dark color or changes to pink, red, or brown. Any of the following symptoms occur over the next 10 days:  You have a temperature by mouth above 102 F (38.9 C), not controlled by medicine.  Shortness of breath.  Weakness after normal activity.  The white part of the eye turns yellow (jaundice).  You have a decrease in the amount of urine or are urinating less often.  Your urine turns a dark color or changes to pink, red, or brown. Document Released: 02/29/2000 Document Revised: 05/26/2011 Document Reviewed: 10/18/2007 [facility] Patient Information 2014 [place], [place].  _______________________________________________________________________

## 2019-02-08 NOTE — Progress Notes (Signed)
PCP - Orpah Melter MD Cardiologist - Fransico Him MD   Chest x-ray -  EKG - 12/10/2018 chart and epic Stress Test - 07/25/2014 epic ECHO - 07/25/2018 epic Cardiac Cath -   Sleep Study - none CPAP -   Fasting Blood Sugar -  Checks Blood Sugar _____ times a day  Blood Thinner Instructions: none Aspirin Instructions: none Last Dose:  Anesthesia review: Chart given to Konrad Felix PA for review.  HX of: PVC, PAC, Left Bundle Branch Block, Atrial Tachycardia, Heart Murmur, HTN  Patient denies shortness of breath, fever, cough and chest pain at PAT appointment   Patient verbalized understanding of instructions that were given to them at the PAT appointment. Patient was also instructed that they will need to review over the PAT instructions again at home before surgery.

## 2019-02-14 ENCOUNTER — Other Ambulatory Visit (HOSPITAL_COMMUNITY)
Admission: RE | Admit: 2019-02-14 | Discharge: 2019-02-14 | Disposition: A | Discharge status: Home / Self Care | Payer: Medicare Other | Source: Ambulatory Visit | Attending: Gynecologic Oncology | Admitting: Gynecologic Oncology

## 2019-02-14 DIAGNOSIS — Z01812 Encounter for preprocedural laboratory examination: Secondary | ICD-10-CM | POA: Diagnosis not present

## 2019-02-14 DIAGNOSIS — Z20828 Contact with and (suspected) exposure to other viral communicable diseases: Secondary | ICD-10-CM | POA: Insufficient documentation

## 2019-02-15 ENCOUNTER — Other Ambulatory Visit: Payer: Self-pay

## 2019-02-15 ENCOUNTER — Encounter (HOSPITAL_COMMUNITY): Payer: Self-pay

## 2019-02-15 ENCOUNTER — Encounter (HOSPITAL_COMMUNITY)
Admission: RE | Admit: 2019-02-15 | Discharge: 2019-02-15 | Disposition: A | Discharge status: Home / Self Care | Payer: Medicare Other | Source: Ambulatory Visit | Attending: Gynecologic Oncology | Admitting: Gynecologic Oncology

## 2019-02-15 DIAGNOSIS — M810 Age-related osteoporosis without current pathological fracture: Secondary | ICD-10-CM | POA: Diagnosis not present

## 2019-02-15 DIAGNOSIS — C569 Malignant neoplasm of unspecified ovary: Secondary | ICD-10-CM | POA: Insufficient documentation

## 2019-02-15 DIAGNOSIS — R011 Cardiac murmur, unspecified: Secondary | ICD-10-CM | POA: Diagnosis not present

## 2019-02-15 DIAGNOSIS — I493 Ventricular premature depolarization: Secondary | ICD-10-CM | POA: Insufficient documentation

## 2019-02-15 DIAGNOSIS — Z79899 Other long term (current) drug therapy: Secondary | ICD-10-CM | POA: Insufficient documentation

## 2019-02-15 DIAGNOSIS — M797 Fibromyalgia: Secondary | ICD-10-CM | POA: Insufficient documentation

## 2019-02-15 DIAGNOSIS — I447 Left bundle-branch block, unspecified: Secondary | ICD-10-CM | POA: Diagnosis not present

## 2019-02-15 DIAGNOSIS — Z01812 Encounter for preprocedural laboratory examination: Secondary | ICD-10-CM | POA: Insufficient documentation

## 2019-02-15 HISTORY — DX: Rosacea, unspecified: L71.9

## 2019-02-15 LAB — COMPREHENSIVE METABOLIC PANEL
ALT: 20 U/L (ref 0–44)
AST: 25 U/L (ref 15–41)
Albumin: 4.4 g/dL (ref 3.5–5.0)
Alkaline Phosphatase: 58 U/L (ref 38–126)
Anion gap: 9 (ref 5–15)
BUN: 11 mg/dL (ref 8–23)
CO2: 27 mmol/L (ref 22–32)
Calcium: 9.7 mg/dL (ref 8.9–10.3)
Chloride: 101 mmol/L (ref 98–111)
Creatinine, Ser: 0.52 mg/dL (ref 0.44–1.00)
GFR calc Af Amer: >60 mL/min (ref >60)
GFR calc non Af Amer: >60 mL/min (ref >60)
Glucose, Bld: 98 mg/dL (ref 70–99)
Potassium: 4.1 mmol/L (ref 3.5–5.1)
Sodium: 137 mmol/L (ref 135–145)
Total Bilirubin: 0.8 mg/dL (ref 0.3–1.2)
Total Protein: 7.9 g/dL (ref 6.5–8.1)

## 2019-02-15 LAB — URINALYSIS, ROUTINE W REFLEX MICROSCOPIC
Bilirubin Urine: NEGATIVE
Glucose, UA: NEGATIVE mg/dL
Ketones, ur: NEGATIVE mg/dL
Leukocytes,Ua: NEGATIVE
Nitrite: NEGATIVE
Protein, ur: NEGATIVE mg/dL
Specific Gravity, Urine: 1.006 (ref 1.005–1.030)
pH: 7 (ref 5.0–8.0)

## 2019-02-15 LAB — CBC
HCT: 36.6 % (ref 36.0–46.0)
Hemoglobin: 11.6 g/dL — ABNORMAL LOW (ref 12.0–15.0)
MCH: 28.4 pg (ref 26.0–34.0)
MCHC: 31.7 g/dL (ref 30.0–36.0)
MCV: 89.5 fL (ref 80.0–100.0)
Platelets: 317 10*3/uL (ref 150–400)
RBC: 4.09 MIL/uL (ref 3.87–5.11)
RDW: 17 % — ABNORMAL HIGH (ref 11.5–15.5)
WBC: 6.2 10*3/uL (ref 4.0–10.5)
nRBC: 0 % (ref 0.0–0.2)

## 2019-02-15 LAB — NOVEL CORONAVIRUS, NAA (HOSP ORDER, SEND-OUT TO REF LAB; TAT 18-24 HRS): SARS-CoV-2, NAA: NOT DETECTED

## 2019-02-15 LAB — ABO/RH: ABO/RH(D): A POS

## 2019-02-16 ENCOUNTER — Telehealth: Payer: Self-pay

## 2019-02-16 NOTE — Anesthesia Preprocedure Evaluation (Addendum)
Anesthesia Evaluation  Patient identified by MRN, date of birth, ID band Patient awake    Reviewed: Allergy & Precautions, NPO status , Patient's Chart, lab work & pertinent test results  History of Anesthesia Complications Negative for: history of anesthetic complications  Airway Mallampati: II  TM Distance: >3 FB Neck ROM: Limited    Dental  (+) Teeth Intact   Pulmonary neg pulmonary ROS,    Pulmonary exam normal        Cardiovascular (-) anginaNormal cardiovascular exam  LBBB- low risk myoview 2016, EF 55-60%, cleared by cardiology 12/10/18   Neuro/Psych negative psych ROS   GI/Hepatic Neg liver ROS, hiatal hernia, GERD  ,  Endo/Other  negative endocrine ROS  Renal/GU negative Renal ROS   ovarian cancer    Musculoskeletal  (+) Fibromyalgia -  Abdominal   Peds  Hematology negative hematology ROS (+)   Anesthesia Other Findings   Reproductive/Obstetrics                            Anesthesia Physical Anesthesia Plan  ASA: III  Anesthesia Plan: General   Post-op Pain Management:    Induction: Intravenous  PONV Risk Score and Plan: 4 or greater and Ondansetron, Dexamethasone, Treatment may vary due to age or medical condition and Midazolam  Airway Management Planned: Oral ETT  Additional Equipment: None  Intra-op Plan:   Post-operative Plan: Extubation in OR  Informed Consent: I have reviewed the patients History and Physical, chart, labs and discussed the procedure including the risks, benefits and alternatives for the proposed anesthesia with the patient or authorized representative who has indicated his/her understanding and acceptance.     Dental advisory given  Plan Discussed with:   Anesthesia Plan Comments: (Discussed fentanyl allergy- pt experienced palpitations after arriving home after a colonoscopy in which fentanyl was used many years ago. Discussed that  fentanyl is unlikely to have cardiac side effects, and that we will use fentanyl towards beginning of the case and try to minimize overall opioid use.)      Anesthesia Quick Evaluation

## 2019-02-16 NOTE — Progress Notes (Signed)
Anesthesia Chart Review   Case: [id number] Date/Time: 02/17/19 0945   Procedure: XI ROBOTIC ASSISTED TOTAL HYSTERECTOMY BILATERAL SALPINGO OOPHORECTOMY WITH OMENTECTOMY AND POSSIBLE DEBULKING,POSSIBLE LAPAROTOMY (Bilateral )   Anesthesia type: General   Pre-op diagnosis: OVARIAN CANCER   Location: [place] [facility] / [facility] OR[facility]   Surgeon: Everitt Amber, MD      DISCUSSION:.never smoker with h/o GERD, LBBB, fibromyalgia, PVC's, ovarian cancer scheduled for above procedure 02/17/2019 with Dr. Everitt Amber.   Pt last seen by cardiology 12/10/2018.  Seen by Richardson Dopp, PA-C.  Per OV note, "The patient may need peritoneal debulking surgery at some point.  She does have a history of left bundle branch block.  However, her Myoview in 2016 was low risk.  She had normal EF on nuclear stress test at that time.  She has not had symptoms to suggest angina.  Her risk for perioperative major cardiac event is low at 0.9% according to the Revised Cardiac Risk Index.  She does not require further CV testing at this time and is at acceptable risk.  If her surgery is delayed greater than 6 months from now, we can reevaluate her by telephone, if needed."  Anticipate pt can proceed with planned procedure barring acute status change.   VS: BP 128/86   Pulse 82   Temp 36.7 C (Oral)   Resp 16   Ht 5\' 5"  (1.651 m)   Wt 48.1 kg   SpO2 100%   BMI 17.64 kg/m   PROVIDERS: Orpah Melter, MD is PCP   Fransico Him, MD is Cardiologist  LABS: Labs reviewed: Acceptable for surgery. 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 Interval resolution of ascites.  Focal area of parenchymal consolidation in central right middle lobe, which measures 3 cm. Differential diagnosis includes infectious or inflammatory process, atelectasis, and neoplasm. Recommend short-term follow-up by chest CT in 2-3 months.  EKG: 12/10/2018 Rate 97 bpm Normal sinus rhythm  Biatrial enlargement  Septal infarct, age undetermined ST & T wave abnormality, consider lateral ischemia  CV: Echo 07/25/2014 Study Conclusions  - Left ventricle: Abnormal septal motoin. The cavity size was   mildly dilated. Wall thickness was normal. Systolic function was   mildly reduced. The estimated ejection fraction was in the range   of 45% to 50%. - Atrial septum: No defect or patent foramen ovale was identified.  Myocardial Perfusion Imaging 07/25/2014  Myocardial perfusion is abnormal. There is a moderate-sized fixed septal defect, secondary to LBBB. No reversible ischemia This is a low risk study. Overall left ventricular systolic function was normal. The left ventricular ejection fraction is normal (55-65%). There is no prior study for comparison. Past Medical History:  Diagnosis Date  . Allergic rhinitis   . Cervical dysplasia 1980  . Cervicalgia   . Eczema    Rosacea,dermatitis-Dr [employer]  . Esophageal  reflux   . Fibromyalgia   . GERD (gastroesophageal reflux disease)   . Glaucoma, narrow-angle   . Heart murmur   . Hiatal hernia   . Irritable bowel syndrome   . LBBB (left bundle branch block)   . Left bundle branch block   . 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 Mitral valve disorders(424.0)    No antibiotics required  . Osteoporosis 12/2016    T score -2.3, 2014 T score -2.5 AP spine  . PAC (premature atrial contraction) 2012   ,PVC's, and nonsustained atril tachycardia w aberration by heart monitor   . PVC (premature ventricular contraction)   . Rosacea   . Stricture and stenosis of esophagus     Past Surgical History:  Procedure Laterality Date  . BREAST LUMPECTOMY  1998   right breast with radiation  . CERVICAL DISCECTOMY     C3-5 with bone graft  . GYNECOLOGIC CRYOSURGERY  1980  . IR IMAGING GUIDED PORT INSERTION  12/24/2018  . NECK SURGERY  [phone]   bone graft and fusion  . REFRACTIVE SURGERY     narrow angle glucoma  . RHINOPLASTY  1976  . TUBAL LIGATION  1977    MEDICATIONS: . acetaminophen (TYLENOL) 500 MG tablet  . ARTIFICIAL TEAR SOLUTION OP  . Azelaic Acid (FINACEA) 15 % cream  . cromolyn (NASALCROM) 5.2 MG/ACT nasal spray  . dexamethasone (DECADRON) 4 MG tablet  . [START ON 02/18/2019] enoxaparin (LOVENOX) 40 MG/0.4ML injection  . gabapentin (NEURONTIN) 300 MG capsule  . guaiFENesin (MUCINEX) 600 MG 12 hr tablet  . Ketotifen Fumarate (ALLERGY EYE DROPS OP)  . lidocaine-prilocaine (EMLA) cream  . loratadine (CLARITIN) 5 MG/5ML syrup  . mupirocin ointment (BACTROBAN) 2 %  . ondansetron (ZOFRAN) 8 MG tablet  . oxyCODONE (OXY IR/ROXICODONE) 5 MG immediate release tablet  . prochlorperazine (COMPAZINE) 10 MG tablet  . Risedronate Sodium 35 MG TBEC  . senna-docusate (SENOKOT-S) 8.6-50 MG tablet  . Sulfacetamide Sodium, Acne, 10 % LOTN   No current facility-administered medications for this encounter.     Maia Plan [facility] Pre-Surgical Testing [phone] 02/16/19  3:07 PM

## 2019-02-16 NOTE — Telephone Encounter (Signed)
I spoke with [name] [name] this am.  I told her she is covid negative.  I asked if she had any questions regarding surgery tomorrow.  She did not.

## 2019-02-17 ENCOUNTER — Ambulatory Visit (HOSPITAL_COMMUNITY)
Admission: RE | Admit: 2019-02-17 | Discharge: 2019-02-17 | Disposition: A | Discharge status: Home / Self Care | Payer: Medicare Other | Attending: Gynecologic Oncology | Admitting: Gynecologic Oncology

## 2019-02-17 ENCOUNTER — Ambulatory Visit (HOSPITAL_COMMUNITY): Payer: Medicare Other | Admitting: Physician Assistant

## 2019-02-17 ENCOUNTER — Encounter (HOSPITAL_COMMUNITY)
Admission: RE | Disposition: A | Discharge status: Home / Self Care | Payer: Self-pay | Source: Home / Self Care | Attending: Gynecologic Oncology

## 2019-02-17 ENCOUNTER — Other Ambulatory Visit: Payer: Self-pay

## 2019-02-17 ENCOUNTER — Ambulatory Visit (HOSPITAL_COMMUNITY): Payer: Medicare Other | Admitting: Anesthesiology

## 2019-02-17 ENCOUNTER — Encounter (HOSPITAL_COMMUNITY): Payer: Self-pay | Admitting: Certified Registered Nurse Anesthetist

## 2019-02-17 DIAGNOSIS — K449 Diaphragmatic hernia without obstruction or gangrene: Secondary | ICD-10-CM | POA: Insufficient documentation

## 2019-02-17 DIAGNOSIS — Z791 Long term (current) use of non-steroidal anti-inflammatories (NSAID): Secondary | ICD-10-CM | POA: Insufficient documentation

## 2019-02-17 DIAGNOSIS — Z7901 Long term (current) use of anticoagulants: Secondary | ICD-10-CM | POA: Insufficient documentation

## 2019-02-17 DIAGNOSIS — I447 Left bundle-branch block, unspecified: Secondary | ICD-10-CM | POA: Diagnosis not present

## 2019-02-17 DIAGNOSIS — Z79899 Other long term (current) drug therapy: Secondary | ICD-10-CM | POA: Diagnosis not present

## 2019-02-17 DIAGNOSIS — T451X5A Adverse effect of antineoplastic and immunosuppressive drugs, initial encounter: Secondary | ICD-10-CM | POA: Diagnosis present

## 2019-02-17 DIAGNOSIS — I491 Atrial premature depolarization: Secondary | ICD-10-CM | POA: Diagnosis not present

## 2019-02-17 DIAGNOSIS — D6481 Anemia due to antineoplastic chemotherapy: Secondary | ICD-10-CM | POA: Diagnosis present

## 2019-02-17 DIAGNOSIS — Z923 Personal history of irradiation: Secondary | ICD-10-CM | POA: Diagnosis not present

## 2019-02-17 DIAGNOSIS — Z8041 Family history of malignant neoplasm of ovary: Secondary | ICD-10-CM | POA: Diagnosis not present

## 2019-02-17 DIAGNOSIS — R188 Other ascites: Secondary | ICD-10-CM | POA: Insufficient documentation

## 2019-02-17 DIAGNOSIS — Z8249 Family history of ischemic heart disease and other diseases of the circulatory system: Secondary | ICD-10-CM | POA: Diagnosis not present

## 2019-02-17 DIAGNOSIS — K589 Irritable bowel syndrome without diarrhea: Secondary | ICD-10-CM | POA: Insufficient documentation

## 2019-02-17 DIAGNOSIS — M797 Fibromyalgia: Secondary | ICD-10-CM | POA: Insufficient documentation

## 2019-02-17 DIAGNOSIS — Z853 Personal history of malignant neoplasm of breast: Secondary | ICD-10-CM | POA: Insufficient documentation

## 2019-02-17 DIAGNOSIS — C482 Malignant neoplasm of peritoneum, unspecified: Secondary | ICD-10-CM | POA: Diagnosis not present

## 2019-02-17 DIAGNOSIS — M810 Age-related osteoporosis without current pathological fracture: Secondary | ICD-10-CM | POA: Insufficient documentation

## 2019-02-17 DIAGNOSIS — C569 Malignant neoplasm of unspecified ovary: Secondary | ICD-10-CM | POA: Diagnosis not present

## 2019-02-17 DIAGNOSIS — C7962 Secondary malignant neoplasm of left ovary: Secondary | ICD-10-CM | POA: Insufficient documentation

## 2019-02-17 DIAGNOSIS — K219 Gastro-esophageal reflux disease without esophagitis: Secondary | ICD-10-CM | POA: Insufficient documentation

## 2019-02-17 HISTORY — PX: OMENTECTOMY: SHX2098

## 2019-02-17 LAB — TYPE AND SCREEN
ABO/RH(D): A POS
Antibody Screen: NEGATIVE

## 2019-02-17 SURGERY — XI ROBOTIC ASSISTED TOTAL HYSTERECTOMY BILATERAL SALPINGO OOPHORECTOMY WITH OMENTECTOMY AND DEBULKING
Anesthesia: General | Laterality: Bilateral

## 2019-02-17 MED ORDER — OXYCODONE HCL 5 MG PO TABS: 5.0000 mg | ORAL_TABLET | ORAL | Status: DC | PRN

## 2019-02-17 MED ORDER — LIDOCAINE 20MG/ML (2%) 15 ML SYRINGE OPTIME
INTRAMUSCULAR | Status: DC | PRN
  Administered 2019-02-17: 1.5 mg/kg/h via INTRAVENOUS

## 2019-02-17 MED ORDER — ROCURONIUM BROMIDE 10 MG/ML (PF) SYRINGE
PREFILLED_SYRINGE | INTRAVENOUS | Status: DC | PRN
  Administered 2019-02-17: 20 mg via INTRAVENOUS
  Administered 2019-02-17: 50 mg via INTRAVENOUS
  Administered 2019-02-17: 20 mg via INTRAVENOUS

## 2019-02-17 MED ORDER — MIDAZOLAM HCL 2 MG/2ML IJ SOLN
INTRAMUSCULAR | Status: AC
  Filled 2019-02-17: qty 2

## 2019-02-17 MED ORDER — ACETAMINOPHEN 650 MG RE SUPP
650.0000 mg | RECTAL | Status: DC | PRN
  Filled 2019-02-17: qty 1

## 2019-02-17 MED ORDER — HYDROMORPHONE HCL 1 MG/ML IJ SOLN
0.2500 mg | INTRAMUSCULAR | Status: DC | PRN
  Administered 2019-02-17 (×4): 0.5 mg via INTRAVENOUS

## 2019-02-17 MED ORDER — ONDANSETRON HCL 4 MG/2ML IJ SOLN
INTRAMUSCULAR | Status: DC | PRN
  Administered 2019-02-17: 4 mg via INTRAVENOUS

## 2019-02-17 MED ORDER — ROCURONIUM BROMIDE 10 MG/ML (PF) SYRINGE
PREFILLED_SYRINGE | INTRAVENOUS | Status: AC
  Filled 2019-02-17: qty 10

## 2019-02-17 MED ORDER — GABAPENTIN 300 MG PO CAPS
300.0000 mg | ORAL_CAPSULE | ORAL | Status: AC
  Administered 2019-02-17: 300 mg via ORAL
  Filled 2019-02-17: qty 1

## 2019-02-17 MED ORDER — MORPHINE SULFATE (PF) 4 MG/ML IV SOLN: 2.0000 mg | INTRAVENOUS | Status: DC | PRN

## 2019-02-17 MED ORDER — SODIUM CHLORIDE 0.9% FLUSH: 3.0000 mL | INTRAVENOUS | Status: DC | PRN

## 2019-02-17 MED ORDER — FENTANYL CITRATE (PF) 100 MCG/2ML IJ SOLN
INTRAMUSCULAR | Status: DC | PRN
  Administered 2019-02-17: 100 ug via INTRAVENOUS
  Administered 2019-02-17 (×2): 50 ug via INTRAVENOUS

## 2019-02-17 MED ORDER — CEFAZOLIN SODIUM-DEXTROSE 2-4 GM/100ML-% IV SOLN
2.0000 g | INTRAVENOUS | Status: AC
  Administered 2019-02-17: 2 g via INTRAVENOUS
  Filled 2019-02-17: qty 100

## 2019-02-17 MED ORDER — LIDOCAINE 2% (20 MG/ML) 5 ML SYRINGE
INTRAMUSCULAR | Status: DC | PRN
  Administered 2019-02-17: 80 mg via INTRAVENOUS

## 2019-02-17 MED ORDER — LACTATED RINGERS IR SOLN
Status: DC | PRN
  Administered 2019-02-17: 1000 mL

## 2019-02-17 MED ORDER — MIDAZOLAM HCL 5 MG/5ML IJ SOLN
INTRAMUSCULAR | Status: DC | PRN
  Administered 2019-02-17: 2 mg via INTRAVENOUS

## 2019-02-17 MED ORDER — SODIUM CHLORIDE 0.9 % IV SOLN: 250.0000 mL | INTRAVENOUS | Status: DC | PRN

## 2019-02-17 MED ORDER — HYDROMORPHONE HCL 1 MG/ML IJ SOLN
INTRAMUSCULAR | Status: AC
  Administered 2019-02-17: 0.5 mg via INTRAVENOUS
  Filled 2019-02-17: qty 1

## 2019-02-17 MED ORDER — DEXAMETHASONE SODIUM PHOSPHATE 4 MG/ML IJ SOLN
4.0000 mg | INTRAMUSCULAR | Status: AC
  Administered 2019-02-17: 5 mg via INTRAVENOUS

## 2019-02-17 MED ORDER — ENOXAPARIN SODIUM 40 MG/0.4ML ~~LOC~~ SOLN
40.0000 mg | SUBCUTANEOUS | Status: AC
  Administered 2019-02-17: 40 mg via SUBCUTANEOUS
  Filled 2019-02-17: qty 0.4

## 2019-02-17 MED ORDER — SUGAMMADEX SODIUM 200 MG/2ML IV SOLN
INTRAVENOUS | Status: DC | PRN
  Administered 2019-02-17: 100 mg via INTRAVENOUS

## 2019-02-17 MED ORDER — LIDOCAINE HCL 2 % IJ SOLN
INTRAMUSCULAR | Status: AC
  Filled 2019-02-17: qty 20

## 2019-02-17 MED ORDER — SODIUM CHLORIDE 0.9% FLUSH: 3.0000 mL | Freq: Two times a day (BID) | INTRAVENOUS | Status: DC

## 2019-02-17 MED ORDER — PHENYLEPHRINE 40 MCG/ML (10ML) SYRINGE FOR IV PUSH (FOR BLOOD PRESSURE SUPPORT)
PREFILLED_SYRINGE | INTRAVENOUS | Status: DC | PRN
  Administered 2019-02-17 (×2): 80 ug via INTRAVENOUS
  Administered 2019-02-17: 40 ug via INTRAVENOUS
  Administered 2019-02-17 (×2): 80 ug via INTRAVENOUS

## 2019-02-17 MED ORDER — ONDANSETRON HCL 4 MG/2ML IJ SOLN: 4.0000 mg | Freq: Once | INTRAMUSCULAR | Status: DC | PRN

## 2019-02-17 MED ORDER — LIDOCAINE 2% (20 MG/ML) 5 ML SYRINGE
INTRAMUSCULAR | Status: AC
  Filled 2019-02-17: qty 5

## 2019-02-17 MED ORDER — BUPIVACAINE LIPOSOME 1.3 % IJ SUSP
20.0000 mL | Freq: Once | INTRAMUSCULAR | Status: AC
  Administered 2019-02-17: 20 mL
  Filled 2019-02-17: qty 20

## 2019-02-17 MED ORDER — ACETAMINOPHEN 500 MG PO TABS
1000.0000 mg | ORAL_TABLET | ORAL | Status: AC
  Administered 2019-02-17: 1000 mg via ORAL
  Filled 2019-02-17: qty 2

## 2019-02-17 MED ORDER — BUPIVACAINE HCL (PF) 0.25 % IJ SOLN
INTRAMUSCULAR | Status: AC
  Filled 2019-02-17: qty 30

## 2019-02-17 MED ORDER — ACETAMINOPHEN 325 MG PO TABS: 650.0000 mg | ORAL_TABLET | ORAL | Status: DC | PRN

## 2019-02-17 MED ORDER — EPHEDRINE SULFATE-NACL 50-0.9 MG/10ML-% IV SOSY
PREFILLED_SYRINGE | INTRAVENOUS | Status: DC | PRN
  Administered 2019-02-17: 5 mg via INTRAVENOUS
  Administered 2019-02-17 (×2): 10 mg via INTRAVENOUS

## 2019-02-17 MED ORDER — PROPOFOL 10 MG/ML IV BOLUS
INTRAVENOUS | Status: DC | PRN
  Administered 2019-02-17: 90 mg via INTRAVENOUS

## 2019-02-17 MED ORDER — LACTATED RINGERS IV SOLN
INTRAVENOUS | Status: DC
  Administered 2019-02-17: 1000 mL via INTRAVENOUS
  Administered 2019-02-17 (×2): via INTRAVENOUS

## 2019-02-17 MED ORDER — BUPIVACAINE HCL 0.25 % IJ SOLN
INTRAMUSCULAR | Status: DC | PRN
  Administered 2019-02-17: 30 mL

## 2019-02-17 MED ORDER — MEPERIDINE HCL 50 MG/ML IJ SOLN: 6.2500 mg | INTRAMUSCULAR | Status: DC | PRN

## 2019-02-17 MED ORDER — PROPOFOL 10 MG/ML IV BOLUS
INTRAVENOUS | Status: AC
  Filled 2019-02-17: qty 20

## 2019-02-17 SURGICAL SUPPLY — 76 items
ADH SKN CLS APL DERMABOND .7 (GAUZE/BANDAGES/DRESSINGS) ×1
AGENT HMST KT MTR STRL THRMB (HEMOSTASIS)
APL ESCP 34 STRL LF DISP (HEMOSTASIS)
APPLICATOR SURGIFLO ENDO (HEMOSTASIS) IMPLANT
BAG LAPAROSCOPIC 12 15 PORT 16 (BASKET) IMPLANT
BAG RETRIEVAL 12/15 (BASKET)
BAG SPEC RTRVL LRG 6X4 10 (ENDOMECHANICALS)
BLADE SURG SZ10 CARB STEEL (BLADE) IMPLANT
CELLS DAT CNTRL 66122 CELL SVR (MISCELLANEOUS) ×1 IMPLANT
COVER BACK TABLE 60X90IN (DRAPES) ×2 IMPLANT
COVER TIP SHEARS 8 DVNC (MISCELLANEOUS) ×1 IMPLANT
COVER TIP SHEARS 8MM DA VINCI (MISCELLANEOUS) ×1
COVER WAND RF STERILE (DRAPES) IMPLANT
DECANTER SPIKE VIAL GLASS SM (MISCELLANEOUS) IMPLANT
DERMABOND ADVANCED (GAUZE/BANDAGES/DRESSINGS) ×1
DERMABOND ADVANCED .7 DNX12 (GAUZE/BANDAGES/DRESSINGS) ×1 IMPLANT
DRAPE ARM DVNC X/XI (DISPOSABLE) ×4 IMPLANT
DRAPE COLUMN DVNC XI (DISPOSABLE) ×1 IMPLANT
DRAPE DA VINCI XI ARM (DISPOSABLE) ×4
DRAPE DA VINCI XI COLUMN (DISPOSABLE) ×1
DRAPE SHEET LG 3/4 BI-LAMINATE (DRAPES) ×2 IMPLANT
DRAPE SURG IRRIG POUCH 19X23 (DRAPES) ×2 IMPLANT
DRSG OPSITE POSTOP 4X6 (GAUZE/BANDAGES/DRESSINGS) ×1 IMPLANT
DRSG OPSITE POSTOP 4X8 (GAUZE/BANDAGES/DRESSINGS) IMPLANT
ELECT REM PT RETURN 15FT ADLT (MISCELLANEOUS) ×2 IMPLANT
GLOVE BIO SURGEON STRL SZ 6 (GLOVE) ×8 IMPLANT
GLOVE BIO SURGEON STRL SZ 6.5 (GLOVE) ×2 IMPLANT
GOWN STRL REUS W/ TWL LRG LVL3 (GOWN DISPOSABLE) ×4 IMPLANT
GOWN STRL REUS W/TWL LRG LVL3 (GOWN DISPOSABLE) ×8
HOLDER FOLEY CATH W/STRAP (MISCELLANEOUS) ×2 IMPLANT
IRRIG SUCT STRYKERFLOW 2 WTIP (MISCELLANEOUS) ×2
IRRIGATION SUCT STRKRFLW 2 WTP (MISCELLANEOUS) ×1 IMPLANT
KIT PROCEDURE DA VINCI SI (MISCELLANEOUS)
KIT PROCEDURE DVNC SI (MISCELLANEOUS) IMPLANT
KIT TURNOVER KIT A (KITS) ×1 IMPLANT
LIGASURE IMPACT 36 18CM CVD LR (INSTRUMENTS) ×1 IMPLANT
MANIPULATOR UTERINE 4.5 ZUMI (MISCELLANEOUS) ×2 IMPLANT
NDL SPNL 18GX3.5 QUINCKE PK (NEEDLE) IMPLANT
NEEDLE HYPO 22GX1.5 SAFETY (NEEDLE) ×1 IMPLANT
NEEDLE SPNL 18GX3.5 QUINCKE PK (NEEDLE) IMPLANT
OBTURATOR OPTICAL STANDARD 8MM (TROCAR) ×1
OBTURATOR OPTICAL STND 8 DVNC (TROCAR) ×1
OBTURATOR OPTICALSTD 8 DVNC (TROCAR) ×1 IMPLANT
PACK ROBOT GYN CUSTOM WL (TRAY / TRAY PROCEDURE) ×2 IMPLANT
PAD POSITIONING PINK XL (MISCELLANEOUS) ×2 IMPLANT
PENCIL SMOKE EVACUATOR (MISCELLANEOUS) ×1 IMPLANT
PORT ACCESS TROCAR AIRSEAL 12 (TROCAR) ×1 IMPLANT
PORT ACCESS TROCAR AIRSEAL 5M (TROCAR) ×1
POUCH SPECIMEN RETRIEVAL 10MM (ENDOMECHANICALS) IMPLANT
RETRACTOR WND ALEXIS 18 MED (MISCELLANEOUS) IMPLANT
RTRCTR WOUND ALEXIS 18CM MED (MISCELLANEOUS) ×2
SEAL CANN UNIV 5-8 DVNC XI (MISCELLANEOUS) ×3 IMPLANT
SEAL XI 5MM-8MM UNIVERSAL (MISCELLANEOUS) ×4
SET TRI-LUMEN FLTR TB AIRSEAL (TUBING) ×2 IMPLANT
SPONGE DRAIN TRACH 4X4 STRL 2S (GAUZE/BANDAGES/DRESSINGS) ×1 IMPLANT
SPONGE LAP 18X18 X RAY DECT (DISPOSABLE) ×2 IMPLANT
SURGIFLO W/THROMBIN 8M KIT (HEMOSTASIS) IMPLANT
SUT MNCRL AB 4-0 PS2 18 (SUTURE) ×3 IMPLANT
SUT PDS AB 1 TP1 96 (SUTURE) ×2 IMPLANT
SUT SILK 3 0 SH CR/8 (SUTURE) ×2 IMPLANT
SUT VIC AB 0 CT1 27 (SUTURE) ×2
SUT VIC AB 0 CT1 27XBRD ANTBC (SUTURE) IMPLANT
SUT VIC AB 2-0 CT1 27 (SUTURE) ×2
SUT VIC AB 2-0 CT1 TAPERPNT 27 (SUTURE) IMPLANT
SUT VIC AB 3-0 SH 27 (SUTURE) ×6
SUT VIC AB 3-0 SH 27X BRD (SUTURE) IMPLANT
SUT VIC AB 3-0 SH 27XBRD (SUTURE) IMPLANT
SUT VICRYL 4-0 PS2 18IN ABS (SUTURE) ×4 IMPLANT
SYR 10ML LL (SYRINGE) ×1 IMPLANT
TOWEL OR NON WOVEN STRL DISP B (DISPOSABLE) ×2 IMPLANT
TRAP SPECIMEN MUCOUS 40CC (MISCELLANEOUS) IMPLANT
TRAY FOLEY MTR SLVR 16FR STAT (SET/KITS/TRAYS/PACK) ×2 IMPLANT
TROCAR XCEL NON-BLD 5MMX100MML (ENDOMECHANICALS) IMPLANT
UNDERPAD 30X36 HEAVY ABSORB (UNDERPADS AND DIAPERS) ×2 IMPLANT
WATER STERILE IRR 1000ML POUR (IV SOLUTION) ×2 IMPLANT
YANKAUER SUCT BULB TIP 10FT TU (MISCELLANEOUS) ×1 IMPLANT

## 2019-02-17 NOTE — Anesthesia Postprocedure Evaluation (Signed)
Anesthesia Post Note  Patient: [name] [name]  Procedure(s) Performed: XI ROBOTIC ASSISTED TOTAL HYSTERECTOMY BILATERAL SALPINGO OOPHORECTOMY WITH OMENTECTOMY AND DEBULKING, LAPAROTOMY (Bilateral )     Patient location during evaluation: PACU Anesthesia Type: General Level of consciousness: sedated and patient cooperative Pain management: pain level controlled Vital Signs Assessment: post-procedure vital signs reviewed and stable Respiratory status: spontaneous breathing Cardiovascular status: stable Anesthetic complications: no    Last Vitals:  Vitals:   02/17/19 1500 02/17/19 1515  BP:  (!) 102/46  Pulse: 77 81  Resp: 11 12  Temp:  (!) 36.3 C  SpO2: 100% 95%    Last Pain:  Vitals:   02/17/19 1500  TempSrc:   PainSc: [place]

## 2019-02-17 NOTE — Interval H&P Note (Signed)
History and Physical Interval Note:  02/17/2019 10:16 AM  [name] [name]  has presented today for surgery, with the diagnosis of OVARIAN CANCER.  The various methods of treatment have been discussed with the patient and family. After consideration of risks, benefits and other options for treatment, the patient has consented to  Procedure(s): XI ROBOTIC ASSISTED TOTAL HYSTERECTOMY BILATERAL SALPINGO OOPHORECTOMY WITH OMENTECTOMY AND POSSIBLE DEBULKING,POSSIBLE LAPAROTOMY (Bilateral) as a surgical intervention.  The patient's history has been reviewed, patient examined, no change in status, stable for surgery.  I have reviewed the patient's chart and labs.  Questions were answered to the patient's satisfaction.     Thereasa Solo

## 2019-02-17 NOTE — Anesthesia Procedure Notes (Signed)
Procedure Name: Intubation Date/Time: 02/17/2019 10:58 AM Performed by: Claudia Desanctis, CRNA Pre-anesthesia Checklist: Patient identified, Emergency Drugs available, Suction available and Patient being monitored Patient Re-evaluated:Patient Re-evaluated prior to induction Oxygen Delivery Method: Circle system utilized Preoxygenation: Pre-oxygenation with 100% oxygen Induction Type: IV induction Ventilation: Mask ventilation without difficulty Laryngoscope Size: 2 and Miller Grade View: Grade I Tube type: Oral Tube size: 7.0 mm Number of attempts: 1 Airway Equipment and Method: Stylet Placement Confirmation: ETT inserted through vocal cords under direct vision,  positive ETCO2 and breath sounds checked- equal and bilateral Secured at: 20 cm Tube secured with: Tape Dental Injury: Teeth and Oropharynx as per pre-operative assessment

## 2019-02-17 NOTE — Transfer of Care (Signed)
Immediate Anesthesia Transfer of Care Note  Patient: KHALEESI CRISTIANO  Procedure(s) Performed: XI ROBOTIC ASSISTED TOTAL HYSTERECTOMY BILATERAL SALPINGO OOPHORECTOMY WITH OMENTECTOMY AND DEBULKING, LAPAROTOMY (Bilateral )  Patient Location: PACU  Anesthesia Type:General  Level of Consciousness: awake and patient cooperative  Airway & Oxygen Therapy: Patient Spontanous Breathing and Patient connected to face mask  Post-op Assessment: Report given to RN and Post -op Vital signs reviewed and stable  Post vital signs: Reviewed and stable  Last Vitals:  Vitals Value Taken Time  BP 125/53 02/17/19 1400  Temp    Pulse 72 02/17/19 1400  Resp 17 02/17/19 1400  SpO2 100 % 02/17/19 1400  Vitals shown include unvalidated device data.  Last Pain:  Vitals:   02/17/19 0849  TempSrc:   PainSc: 2       Patients Stated Pain Goal: 4 ([id number])  Complications: No apparent anesthesia complications

## 2019-02-17 NOTE — Discharge Instructions (Signed)
Return to work: 4 weeks (2 weeks with physical restrictions).  Activity: 1. Be up and out of the bed during the day.  Take a nap if needed.  You may walk up steps but be careful and use the hand rail.  Stair climbing will tire you more than you think, you may need to stop part way and rest.   2. No lifting or straining for 4 weeks.  3. No driving for 1 weeks.  Do Not drive if you are taking narcotic pain medicine.  4. Shower daily.  Use soap and water on your incision and pat dry; don't rub.   5. No sexual activity and nothing in the vagina for 8 weeks.  Medications:  - Take ibuprofen and tylenol first line for pain control. Take these regularly (every 6 hours) to decrease the build up of pain.  - If necessary, for severe pain not relieved by ibuprofen, contact Dr Quavis Klutz's office and you will be prescribed percocet.  - While taking percocet you should take sennakot every night to reduce the likelihood of constipation. If this causes diarrhea, stop its use.  Diet: 1. Low sodium Heart Healthy Diet is recommended.  2. It is safe to use a laxative if you have difficulty moving your bowels.   Wound Care: 1. Keep clean and dry.  Shower daily. 2. You can get the dressing wet in the shower, but avoid tub baths. 3. Remove the dressing between 5 and 7 days after your surgery (1 week after your operation). 4. After the dressing is removed you can get the incision wet in the shower, however continue to avoid tub baths until advised otherwise by your surgeon at follow-up.   Reasons to call the Doctor:   Fever - Oral temperature greater than 100.4 degrees Fahrenheit  Foul-smelling vaginal discharge  Difficulty urinating  Nausea and vomiting  Increased pain at the site of the incision that is unrelieved with pain medicine.  Difficulty breathing with or without chest pain  New calf pain especially if only on one side  Sudden, continuing increased vaginal bleeding with or without  clots.   Follow-up: 1. See [name] [name] in 4 weeks.  Contacts: For questions or concerns you should contact:  Dr. Shaiann Mcmanamon at [phone] After hours and on week-ends call [phone] and ask to speak to the physician on call for Gynecologic Oncology  After Your Surgery  The information in this section will tell you what to expect after your surgery, both during your stay and after you leave. You will learn how to safely recover from your surgery. Write down any questions you have and be sure to ask your doctor or nurse.  What to Expect When you wake up after your surgery, you will be in the Post-Anesthesia Care Unit (PACU) or your recovery room. A nurse will be monitoring your body temperature, blood pressure, pulse, and oxygen levels. You may have a urinary catheter in your bladder to help monitor the amount of urine you are making. It should come out before you go home. You will also have compression boots on your lower legs to help your circulation. Your pain medication will be given through an IV line or in tablet form. If you are having pain, tell your nurse. Your nurse will tell you how to recover from your surgery. Below are examples of ways you can help yourself recover safely. . You will be encouraged to walk with the help of your nurse or physical therapist.   We will give you medication to relieve pain. Walking helps reduce the risk for blood clots and pneumonia. It also helps to stimulate your bowels so they begin working again. . Use your incentive spirometer. This will help your lungs expand, which prevents pneumonia.   Commonly Asked Questions  Will I have pain after surgery? Yes, you will have some pain after your surgery, especially in the first few days. Your doctor and nurse will ask you about your pain often. You will be given medication to manage your pain as needed. If your pain is not relieved, please tell your doctor or nurse. It is important to control your pain  so you can cough, breathe deeply, use your incentive spirometer, and get out of bed and walk.  Will I be able to eat? Yes, you will be able to eat a regular diet or eat as tolerated. You should start with foods that are soft and easy to digest such as apple sauce and chicken noodle soup. Eat small meals frequently, and then advance to regular foods. If you experience bloating, gas, or cramps, limit high-fiber foods, including whole grain breads and cereal, nuts, seeds, salads, fresh fruit, broccoli, cabbage, and cauliflower. Will I have pain when I am home? The length of time each person has pain or discomfort varies. You may still have some pain when you go home and will probably be taking pain medication. Follow the guidelines below. . Take your medications as directed and as needed. . Call your doctor if the medication prescribed for you doesn't relieve your pain. . Don't drive or drink alcohol while you're taking prescription pain medication. . As your incision heals, you will have less pain and need less pain medication. A mild pain reliever such as acetaminophen (Tylenol) or ibuprofen (Advil) will relieve aches and discomfort. However, large quantities of acetaminophen may be harmful to your liver. Don't take more acetaminophen than the amount directed on the bottle or as instructed by your doctor or nurse. . Pain medication should help you as you resume your normal activities. Take enough medication to do your exercises comfortably. Pain medication is most effective 30 to 45 minutes after taking it. . Keep track of when you take your pain medication. Taking it when your pain first begins is more effective than waiting for the pain to get worse. Pain medication may cause constipation (having fewer bowel movements than what is normal for you).  How can I prevent constipation? . Go to the bathroom at the same time every day. Your body will get used to going at that time. . If you feel the urge  to go, don't put it off. Try to use the bathroom 5 to 15 minutes after meals. . After breakfast is a good time to move your bowels. The reflexes in your colon are strongest at this time. . Exercise, if you can. Walking is an excellent form of exercise. . Drink 8 (8-ounce) glasses (2 liters) of liquids daily, if you can. Drink water, juices, soups, ice cream shakes, and other drinks that don't have caffeine. Drinks with caffeine, such as coffee and soda, pull fluid out of the body. . Slowly increase the fiber in your diet to 25 to 35 grams per day. Fruits, vegetables, whole grains, and cereals contain fiber. If you have an ostomy or have had recent bowel surgery, check with your doctor or nurse before making any changes in your diet. . Both over-the-counter and prescription medications are available to treat constipation.   Start with 1 of the following over-the-counter medications first: o Docusate sodium (Colace) 100 mg. Take ___1__ capsules _2____ times a day. This is a stool softener that causes few side effects. Don't take it with mineral oil. o Polyethylene glycol (MiraLAX) 17 grams daily. o Senna (Senokot) 2 tablets at bedtime. This is a stimulant laxative, which can cause cramping. . If you haven't had a bowel movement in 2 days, call your doctor or nurse.  Can I shower? Yes, you should shower 24 hours after your surgery. Be sure to shower every day. Taking a warm shower is relaxing and can help decrease muscle aches. Use soap when you shower and gently wash your incision. Pat the areas dry with a towel after showering, and leave your incision uncovered (unless there is drainage). Call your doctor if you see any redness or drainage from your incision. Don't take tub baths until you discuss it with your doctor at the first appointment after your surgery. How do I care for my incisions? You will have several small incisions on your abdomen. The incisions are closed with Steri-Strips or  Dermabond. You may also have square white dressings on your incisions ([place]). You can remove these in the shower 24 hours after your surgery. You should clean your incisions with soap and water. If you go home with Steri-Strips on your incision, they will loosen and may fall off by themselves. If they haven't fallen off within 10 days, you can remove them. If you go home with Dermabond over your sutures (stitches), it will also loosen and peel off.  What are the most common symptoms after a hysterectomy? It's common for you to have some vaginal spotting or light bleeding. You should monitor this with a pad or a panty liner. If you have having heavy bleeding (bleeding through a pad or liner every 1 to 2 hours), call your doctor right away. It's also common to have some discomfort after surgery from the air that was pumped into your abdomen during surgery. To help with this, walk, drink plenty of liquids and make sure to take the stool softeners you received.  When is it safe for me to drive? You may resume driving 2 weeks after surgery, as long as you are not taking pain medication that may make you drowsy.  When can I resume sexual activity? Do not place anything in your vagina or have vaginal intercourse for 8 weeks after your surgery. Some people will need to wait longer than 8 weeks, so speak with your doctor before resuming sexual intercourse.  Will I be able to travel? Yes, you can travel. If you are traveling by plane within a few weeks after your surgery, make sure you get up and walk every hour. Be sure to stretch your legs, drink plenty of liquids, and keep your feet elevated when possible.  Will I need any supplies? Most people do not need any supplies after the surgery. In the rare case that you do need supplies, such as tubes or drains, your nurse will order them for you.  When can I return to work? The time it takes to return to work depends on the type of work you do, the  type of surgery you had, and how fast your body heals. Most people can return to work about 2 to 4 weeks after the surgery.  What exercises can I do? Exercise will help you gain strength and feel better. Walking and stair climbing are excellent forms of exercise.   Gradually increase the distance you walk. Climb stairs slowly, resting or stopping as needed. Ask your doctor or nurse before starting more strenuous exercises.  When can I lift heavy objects? Most people should not lift anything heavier than 10 pounds (4.5 kilograms) for at least 4 weeks after surgery. Speak with your doctor about when you can do heavy lifting.  How can I cope with my feelings? After surgery for a serious illness, you may have new and upsetting feelings. Many people say they felt weepy, sad, worried, nervous, irritable, and angry at one time or another. You may find that you can't control some of these feelings. If this happens, it's a good idea to seek emotional support. The first step in coping is to talk about how you feel. Family and friends can help. Your nurse, doctor, and social worker can reassure, support, and guide you. It's always a good idea to let these professionals know how you, your family, and your friends are feeling emotionally. Many resources are available to patients and their families. Whether you're in the hospital or at home, the nurses, doctors, and social workers are here to help you and your family and friends handle the emotional aspects of your illness.  When is my first appointment after surgery? Your first appointment after surgery will be 2 to 4 weeks after surgery. Your nurse will give you instructions on how to make this appointment, including the phone number to call.  What if I have other questions? If you have any questions or concerns, please talk with your doctor or nurse. You can reach them Monday through Friday from 9:00 am to 5:00 pm. After 5:00 pm, during the weekend, and on  holidays, call [phone] and ask for the doctor on call for your doctor.  . Have a temperature of 101 F (38.3 C) or higher . Have pain that does not get better with pain medication . Have redness, drainage, or swelling from your incisions 

## 2019-02-17 NOTE — Op Note (Signed)
OPERATIVE NOTE 02/17/19  Surgeon: Donaciano Eva   Assistants: Dr Lahoma Crocker (an MD assistant was necessary for tissue manipulation, management of robotic instrumentation, retraction and positioning due to the complexity of the case and hospital policies).   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  Surgeon: Donaciano Eva  Assistant Surgeon: Lahoma Crocker MD  Anesthesia: GET  Urine Output: 100cc  Operative Findings:  : grossly normal uterus, ovaries normal, few scattered peritoneal nodules (29mm) on serosa of uterus and tubes. The omentum (gastrocolic) was tethered to the mesentery with tumor (thin rind).  Complete (optimal) resection of tumor with no gross residual disease.  Estimated Blood Loss:  50cc      Total IV Fluids: 1,000 ml         Specimens: uterus with cervix and bilateral tubes and ovaries and omentum         Complications:  None; patient tolerated the procedure well.         Disposition: PACU - hemodynamically stable.  Procedure Details  The patient was seen in the Holding Room. The risks, benefits, complications, treatment options, and expected outcomes were discussed with the patient.  The patient concurred with the proposed plan, giving informed consent.  The site of surgery properly noted/marked. The patient was identified as [name] [name] and the procedure verified as a Robotic-assisted hysterectomy with bilateral salpingo oophorectomy and omentectomy. A Time Out was held and the above information confirmed.  After induction of anesthesia, the patient was draped and prepped in the usual sterile manner. Pt was placed in supine position after anesthesia and  draped and prepped in the usual sterile manner. The abdominal drape was placed after the CholoraPrep had been allowed to dry for 3 minutes.  Her arms were tucked to her side with all appropriate precautions.  The shoulders were stabilized with padded shoulder blocks applied to the acromium processes.  The patient was placed in the semi-lithotomy position in [place].  The perineum was prepped with Betadine. The patient was then prepped. Foley catheter was placed.  A uterine manipulator could not be placed due to the narrow vagina, therefore an EEA sizer was used instead.  OG tube placement was confirmed and to suction.   Next, a 5 mm skin incision was made 1 cm below the subcostal margin in the midclavicular line.  The 5 mm Optiview port and scope was used for direct entry.  Opening pressure was under 10 mm CO2.  The abdomen was insufflated and the findings were noted as above.   At this point and all points during the procedure, the patient's intra-abdominal pressure did not exceed 15 mmHg. Next, a 10 mm skin incision was made in the umbilicus and a right and left port was placed about 10 cm lateral to the robot port on the right and left side.  A fourth arm was placed in the left lower quadrant 2 cm above and superior and medial to the anterior superior iliac spine.  All ports were placed under direct visualization.  The patient was placed in steep Trendelenburg.  Bowel was folded away into the upper abdomen.  The robot was docked in the normal manner.  The hysterectomy was started after the round ligament on the right side was incised and the retroperitoneum was entered and the pararectal  space was developed.  The ureter was noted to be on the medial leaf of the broad ligament.  The peritoneum above the ureter was incised and stretched and the infundibulopelvic ligament was skeletonized, cauterized and cut.  The posterior peritoneum was taken down to the level of the EEA sizer. This incision was  extended across the posterior cervix to develop a rectovaginal septum and drop the rectum off of the back of the cervix and vagina.The anterior peritoneum was also taken down.  This was a difficult dissection likely secondary to the indurated bladder flap from regression of tumor. The bladder flap was created to the level of the EEA sizer.  The uterine artery on the right side was skeletonized, cauterized and cut in the normal manner.  A similar procedure was performed on the left.  The colpotomy was made and the uterus, cervix, bilateral ovaries and tubes were amputated and delivered through the vagina.  Pedicles were inspected and excellent hemostasis was achieved.    The colpotomy at the vaginal cuff was closed with Vicryl on a CT1 needle in a running manner.   An area of anterior rectal wall was imbricated with 3-0 vicryl (as was an area of cecum where it had been mobilized from sidewall adhesions) using lembrich's sutures. Irrigation was used and excellent hemostasis was achieved.  At this point in the robotic procedure was completed. It was felt that omentectomy should be performed through minilaparotomy due to the tethering to the small bowel mesentery.  Robotic instruments were removed under direct visulaization.  The robot was undocked.   A 6cm supraumbilical midline vertical incision was made with the scalpel. The subcutaneous skin was opened with the bovie. The fascia was opened with the bovie inferiorally and superiorally. The peritoneum was opened sharply in the midline. The peritoneal incision was extended. The omentectomy was then performed.  Windows were made in the infragastric omentum parallel to the short gastric vessels.  These were sealed with bipolar sealing device.  And transected.  The omentum was taken down from its attachments to the greater curvature of the stomach to into the lesser sac.  The omentum was carefully dissected from the mesentery of the transverse colon.  A 10 cm  stretch of tumor rind was appreciated on the attachment between the gastrocolic omentum and the transverse colon in the midline.  This was carefully dissected from the transverse colon with Metzenbaum scissors.  An additional tethered nodule of omentum existed between the transverse colon and the hepatic flexure.  This was carefully dissected with Metzenbaum scissors and the LigaSure.  Areas of bleeding vessels were made hemostatic with 3-0 silk suture.  The gastric wall was reinforced areas close to the dissection with 3-0 silk suture.  The bowel and gastric wall carefully inspected were noted to be hemostatic and free of injury including thermal sharp.  A manual survey of the peritoneal cavity confirmed no palpable residual tumor in the peritoneal or retroperitoneal spaces.    The fascia was closed with running #1 looped PDS. The subcutaneous fat was closed with 2-0 vicryl. 20cc of exparel mixed with 20cc of marcaine was infiltrated into the incision. The incision was closed at the skin with monocryl and dermabond.   The 10 mm ports were closed with Vicryl on a UR-5 needle and the fascia was closed with 0 Vicryl on a UR-5 needle.  The skin was closed with 4-0 Vicryl in a subcuticular manner.  Dermabond was applied.  Sponge, lap and needle counts correct  x 2.  The patient was taken to the recovery room in stable condition.  The vagina was swabbed with  minimal bleeding noted.   All instrument and needle counts were correct x  3.   The patient was transferred to the recovery room in a stable condition.  Donaciano Eva, MD

## 2019-02-18 ENCOUNTER — Telehealth: Payer: Self-pay

## 2019-02-18 NOTE — Telephone Encounter (Signed)
Ms Reindel states that she has had some nausea and vomiting x 2 this am.  She has taken a Zofran tablet with good effect. She has not eaten too much with nausea. She is passing gas.  Will begin the senokot-s this evening. Told her she can take bid tomorrow if bowels have not moved. She can also add Miralax on Saturday if needed. Experiencing some belching. She can use gas-x. She can apply a heating pad to her back as well. Her pain is ~ 6-7/10 when she is up and around. She is taking tylenol 1000 mg  alt with Ibuprofen 400 mg q hrs. Pt afraid to take the Oxycodone 5 mg tabs. Suggested that she take half a tablet with crackers and wait 45 minutes to see if she is more comfortable. If not, she can take the other half.  Her pain can be contributing to her nausea.   Suggest she splint her abdomen with a small pillow when she gets up and or take deeps breaths and cough. She is eating and drinking but encouraged her to increase fluids. She can eat jello to get fluids in. Dressings are D&I. Told her to remove the honey comb dressing on Tuesday 02-22-19. Instructed Ms Briguglio to call if she develops severe abdominal pain with nausea and vomiting. Some times the bowel does not wake up well after surgery. She would need to call the office at [phone] and listen to the message to get alternate number for after hours nurse.  Tell them that she had surgery with GYN Oncology and would need the on call physician. She has appointment for 03-08-19 for post op appointment. Pt verbalized understanding.

## 2019-02-22 LAB — SURGICAL PATHOLOGY

## 2019-03-04 MED FILL — DEXAMETHASONE 4 MG TABLET: 4 | 21 days supply | Qty: 4 | Fill #3

## 2019-03-05 ENCOUNTER — Encounter: Payer: Self-pay | Admitting: Gynecologic Oncology

## 2019-03-08 ENCOUNTER — Encounter: Payer: Self-pay | Admitting: Gynecologic Oncology

## 2019-03-08 ENCOUNTER — Other Ambulatory Visit: Payer: Self-pay

## 2019-03-08 ENCOUNTER — Inpatient Hospital Stay: Payer: Medicare Other | Attending: Gynecologic Oncology | Admitting: Gynecologic Oncology

## 2019-03-08 VITALS — BP 129/53 | HR 90 | Temp 98.0°F | Resp 16 | Ht 66.0 in | Wt 103.6 lb

## 2019-03-08 DIAGNOSIS — C7989 Secondary malignant neoplasm of other specified sites: Secondary | ICD-10-CM

## 2019-03-08 DIAGNOSIS — C482 Malignant neoplasm of peritoneum, unspecified: Secondary | ICD-10-CM

## 2019-03-08 DIAGNOSIS — Z9221 Personal history of antineoplastic chemotherapy: Secondary | ICD-10-CM

## 2019-03-08 NOTE — Patient Instructions (Signed)
Dr Denman George is facilitating follow up with Dr Alvy Bimler to resume chemotherapy. Dr Alvy Bimler and Elmo Putt (nurse navigator) will facilitate your referral to genetics.  Dr Denman George will see you back after completing therapy for 3 monthly surveillance checks.

## 2019-03-08 NOTE — Progress Notes (Signed)
Follow-up Note: Gyn-Onc  Consult was initially requested by Dr. Olen Pel for the evaluation of [name] [name] 68 y.o. female  Chief Complaint  Patient presents with  . Peritoneal carcinoma [facility])    Assessment/Plan:  [name] [name] is a 68 y.o. with a history of stage IIIC cancer, s/p 3 cycles of neoadjuvant chemotherapy. S/p optimal interval cytoreduction on February 17, 2019.  I recommend that she transition to her final 3 cycles of chemotherapy.  I explained that she will need to have genetic testing of both germline and somatic to rule out BRCA mutations or HRD.  If these are present she would be a candidate for maintenance PARP inhibitor therapy.  I will see her back to transition to surveillance after she has completed her adjuvant therapy. I would recommend a baseline post-treatment CT scan after completing therapy to document disease status.  HPI: [name] [name] is a 68 y.o.   nulliparous female who reported menopause in her late 26s.  She reported a 6-year history of fibromyalgia.  She was in her usual state of health until the spring/summer of 2020 when she noticed increased abdominal girth and some dyspnea upon exertion.  After Friday, September 4 she noted poor appetite and a rather significant increase in the abdominal distention.  She reported heartburn and intermittent nausea.  There had been no rectal or vaginal bleeding but she did report intermittent diarrhea.  [name] [name] was evaluated  in the ED for the complaint of abdominal distention on November 23, 2018.  A CT scan was notable for a slightly irregular liver contour.  Significant perihepatic ascites.  There was vague ill-defined enhancing appearing peritoneal lesions in the mesentery worrisome for peritoneal carcinomatosis.  The uterus and left ovary appeared normal but there was an ill-defined irregular enhancing soft tissue noted in the region of the right adnexa without discrete masses.  Large volume  abdominal pelvic ascites was noted.  Ca1 25 was collected and returned at a value of 253.  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  Her most recent mammogram was in June 2020 and was  within normal limits, her  last colonoscopy was in October 2014 and also was  within normal limits  [name] [name]'s  family history is notable for a sister diagnosed with multiple primaries ovarian cancer the age of 21 and vulva melanoma at the age of 20.  A second sister was diagnosed with melanoma in situ.  A paternal uncle was diagnosed with multiple primaries namely pancreatic cancer multiple myeloma and melanoma.   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  She was diagnosed with stage 3C cancer and was recommended neoadjuvant chemotherapy for 3 cycles followed by interval cytoreductive surgery.   Chemotherapy with carboplatin and paclitaxel began on 12/06/2018.  Day 1 of cycle 3 was administered on 01/17/2019.   CA 125 on 01/17/19 was 57.2.  CT abd/pelvis on 01/31/19 showed significant decrease in peritoneal carcinomatosis since previous study. Interval resolution of ascites. Focal area of parenchymal consolidation in central right middle lobe, which measures 3 cm. Differential diagnosis includes infectious or inflammatory process, atelectasis, and neoplasm.  Interval Hx:  On February 17, 2019 she underwent robotic assisted total hysterectomy with BSO, omentectomy, radical tumor debulking, mini laparotomy for omentectomy. Intraoperative findings were significant for a  grossly normal uterus, normal ovaries, few scattered peritoneal nodules 1 mm on the serosa of the uterus and tubes.  The omentum and the gastrocolic region was tethered to the mesentery with tumor.   Surgery included a complete optimal resection with no gross residual disease at the end of the procedure. Surgery was uncomplicated.  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  There were metastatic implants on the serosa of the uterus, ovaries, and fallopian tubes in addition to metastatic foci greater than 2 cm in the omentum.  Since surgery she is done as well as can be expected with some postoperative soreness but no specific complications or complaints.  Review of Systems: Constitutional  Feels well,  Cardiovascular  No chest pain, shortness of breath, or edema  Pulmonary  No cough or wheeze.  Reports shallow breaths because of abdominal distention sleeps on one pillow Gastro Intestinal  [name] [name] nausea, vomitting, remittent diarrhoea. No bright red blood per rectum, distention that causes abdominal pain, no change in bowel movement, or constipation.  Genito Urinary  No frequency, urgency, dysuria, no vaginal bleeding musculo Skeletal  Intermittent myalgia, arthralgia, joint swelling and pain with flareups of fibromyalgia neurologic  No weakness, numbness, change in gait,  Psychology  No depression, anxiety, insomnia.    Current Meds:  Outpatient Encounter Medications as of 03/08/2019  Medication Sig  . acetaminophen (TYLENOL) 500 MG tablet Take 500 mg by mouth every 6 (six) hours as needed for moderate pain or headache.  . ARTIFICIAL TEAR SOLUTION OP Place 1 drop into both eyes daily as needed (dry eyes).  . Azelaic Acid (FINACEA) 15 % cream Apply 1 application topically daily.  . cromolyn (NASALCROM) 5.2 MG/ACT nasal spray Place 1 spray into the nose as needed for allergies.   Marland Kitchen dexamethasone (DECADRON) 4 MG tablet Take 2 tabs at the night before and 2 tab the morning of chemotherapy, every 3 weeks, by mouth (Patient taking  differently: Take 4 mg by mouth See admin instructions. Take 4 mg at the night before and 4 mg the morning of chemotherapy, every 3 weeks, by mouth)  . gabapentin (NEURONTIN) 300 MG capsule Take 600 mg by mouth 3 (three) times daily.   Marland Kitchen guaiFENesin (MUCINEX) 600 MG 12 hr tablet Take 600 mg by mouth 2 (two) times daily as needed (congestion).  . Ketotifen Fumarate (ALLERGY EYE DROPS OP) Place 1 drop into both eyes daily as needed (allergies).  Marland Kitchen lidocaine-prilocaine (EMLA) cream Apply to affected area once (Patient taking differently: Apply 1 application topically daily as needed (port access). )  . loratadine (CLARITIN) 5 MG/5ML syrup Take 5 mg by mouth daily as needed for allergies (bone pain related to chemo).  . mupirocin ointment (BACTROBAN) 2 % Apply 1 application topically as needed. (Patient taking differently: Apply 1 application topically as needed (wound care). )  . ondansetron (ZOFRAN) 8 MG tablet Take 1 tablet (8 mg total) by mouth every 8 (eight) hours as needed for refractory nausea / vomiting.  . prochlorperazine (COMPAZINE) 10 MG tablet Take 1 tablet (10 mg total) by mouth every 6 (six) hours as needed (Nausea or vomiting).  . Risedronate Sodium 35 MG TBEC Take 1 tablet (35 mg total) by mouth once a week. (Patient taking differently: Take 1 tablet by mouth every Tuesday. )  . Sulfacetamide Sodium, Acne, 10 % LOTN Apply 1 application topically  at bedtime.  . [DISCONTINUED] enoxaparin (LOVENOX) 40 MG/0.4ML injection Inject 0.4 mLs (40 mg total) into the skin daily for 14 doses. For AFTER surgery only  . [DISCONTINUED] oxyCODONE (OXY IR/ROXICODONE) 5 MG immediate release tablet Take 1 tablet (5 mg total) by mouth every 4 (four) hours as needed for severe pain. (Patient not taking: Reported on 03/08/2019)  . [DISCONTINUED] senna-docusate (SENOKOT-S) 8.6-50 MG tablet Take 2 tablets by mouth at bedtime. For AFTER surgery, do not take if having diarrhea (Patient not taking: Reported on  03/08/2019)   No facility-administered encounter medications on file as of 03/08/2019.    Allergy:  Allergies  Allergen Reactions  . Fentanyl Palpitations    Palpitations , fever ,headahe, N/V  " loud pounding heart beat"   . Milk-Related Compounds Diarrhea and Nausea And Vomiting  . Oxybutynin Chloride Other (See Comments)    Ditropan  Pt doesn't remember   . Thimerosal Other (See Comments)    Pt doesn't remember  . Tramadol Other (See Comments)    Pt doesn't remember  . Avelox [Moxifloxacin Hcl In Nacl] Rash  . Doxycycline Rash  . Erythromycin Rash  . Metronidazole Rash  . Minocycline Rash  . Penicillins Rash    Did it involve swelling of the face/tongue/throat, SOB, or low BP? No Did it involve sudden or severe rash/hives, skin peeling, or any reaction on the inside of your mouth or nose? No Did you need to seek medical attention at a hospital or doctor's office? No When did it last happen?Childhood allergy If all above answers are "NO", may proceed with cephalosporin use.   . Quinolones Rash  . Sulfonamide Derivatives Rash    Social Hx:   Social History   Socioeconomic History  . Marital status: Married    Spouse name: Antony Haste  . Number of children: 0  . Years of education: Not on file  . Highest education level: Not on file  Occupational History  . Occupation: Teacher, early years/pre: [employer]  Tobacco Use  . Smoking status: Never Smoker  . Smokeless tobacco: Never Used  Substance and Sexual Activity  . Alcohol use: Yes    Alcohol/week: 2.0 standard drinks    Types: 2 Standard drinks or equivalent per week    Comment: Social   . Drug use: No  . Sexual activity: Not Currently    Birth control/protection: Post-menopausal    Comment: 1st intercourse 68 yo- More than 5 partners  Other Topics Concern  . Not on file  Social History Narrative  . Not on file   Social Determinants of Health   Financial Resource Strain:   . Difficulty of  Paying Living Expenses: Not on file  Food Insecurity:   . Worried About Charity fundraiser in the Last Year: Not on file  . Ran Out of Food in the Last Year: Not on file  Transportation Needs:   . Lack of Transportation (Medical): Not on file  . Lack of Transportation (Non-Medical): Not on file  Physical Activity:   . Days of Exercise per Week: Not on file  . Minutes of Exercise per Session: Not on file  Stress:   . Feeling of Stress : Not on file  Social Connections:   . Frequency of Communication with Friends and Family: Not on file  . Frequency of Social Gatherings with Friends and Family: Not on file  . Attends Religious Services: Not on file  . Active Member of Clubs or Organizations: Not  on file  . Attends Archivist Meetings: Not on file  . Marital Status: Not on file  Intimate Partner Violence:   . Fear of Current or Ex-Partner: Not on file  . Emotionally Abused: Not on file  . Physically Abused: Not on file  . Sexually Abused: Not on file    Past Surgical Hx:  Past Surgical History:  Procedure Laterality Date  . BREAST LUMPECTOMY  1998   right breast with radiation  . CERVICAL DISCECTOMY     C3-5 with bone graft  . GYNECOLOGIC CRYOSURGERY  1980  . IR IMAGING GUIDED PORT INSERTION  12/24/2018  . NECK SURGERY  [id number]   bone graft and fusion  . REFRACTIVE SURGERY     narrow angle glucoma  . RHINOPLASTY  1976  . TUBAL LIGATION  1977    Past Medical Hx:  Past Medical History:  Diagnosis Date  . Allergic rhinitis   . Cervical dysplasia 1980  . Cervicalgia   . Eczema    Rosacea,dermatitis-Dr [employer]  . Esophageal reflux   . Fibromyalgia   . GERD (gastroesophageal reflux disease)   . Glaucoma, narrow-angle   . Heart murmur   . Hiatal hernia   . Irritable bowel syndrome   . LBBB (left bundle branch block)   . Left bundle branch block   . 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 Mitral valve disorders(424.0)    No antibiotics required  . Osteoporosis 12/2016   T score -2.3, 2014 T score -2.5 AP spine  . PAC (premature atrial contraction) 2012   ,PVC's, and nonsustained atril tachycardia w aberration by heart monitor   . PVC (premature ventricular contraction)   . Rosacea   . Stricture and stenosis of esophagus     Past Gynecological History: Menarche at age 7 menopause late 42s nulliparous bilateral tubal ligation in her 24s.  History of abnormal Pap test treated with cryotherapy negative Pap test since  Family Hx:  Family History  Problem Relation Age of Onset  . Heart disease Mother   . Depression Mother   . Hypertension Mother   . Heart disease Father   . Hypertension Father   . Aneurysm Father   . Uterine cancer Sister   . Hypertension Sister   . Ovarian cancer Sister 75  . Melanoma Sister   . Heart disease Sister   . Alcohol abuse Sister   . Depression Sister   . Melanoma Sister   . Heart disease Paternal Grandfather   . Stroke Maternal Grandmother   . Cancer Maternal Grandfather        Pancreatic  . Stroke Paternal Grandmother   . Cancer Paternal Uncle        multiple myeloma  . Cancer Paternal Uncle        prostate cancer  . Cancer Paternal Uncle        melanoma  . Colon cancer Neg Hx   Sister #1 ovarian cancer diagnosed at age 64 vulvar melanoma diagnosed at age 34 Sister #2 melanoma in situ Sister #3 cervical cancer Paternal uncle pancreatic cancer multiple myeloma and melanoma  Vitals:  Blood pressure (!) 129/53, pulse 90, temperature 98 F (36.7 C), resp. rate 16, height _0  (1.676 m), weight 103 lb 9.6 oz (47 kg), SpO2 100 %.  Physical Exam: WD in NAD Neck  Supple NROM, without any  enlargements.  Lymph Node Survey No cervical supraclavicular or inguinal adenopathy Cardiovascular  Pulse normal rate, regularity and rhythm.  Lungs  Clear to auscultation bilaterally, breath sounds present  at the bases .  Skin  No rash/lesions/breakdown  Psychiatry  Alert and oriented appropriate mood affect speech and reasoning. Abdomen  Normoactive bowel sounds, abdomen soft, non-tender, no masses or distension. Incisions well-healed Back No CVA tenderness Genito Urinary  Vaginal cuff is somewhat raw with some blood at the vaginal cuff but no active bleeding.  Sutures are intact.  No palpable defects in the cuff line.  No palpable fluid collections in the pelvis Rectal  deferred Extremities  No bilateral cyanosis, clubbing or edema.  30 minutes was spent with the patient including >50% of direct face to face counseling timet. This included discussion about prognosis, therapy recommendations and postoperative side effects.  Thereasa Solo, MD, PhD 03/08/2019, 4:22 PM

## 2019-03-14 ENCOUNTER — Telehealth: Payer: Self-pay | Admitting: Hematology and Oncology

## 2019-03-14 NOTE — Telephone Encounter (Signed)
Scheduled appt per 12/28 sch message - pt is aware of appt date and time   

## 2019-03-15 ENCOUNTER — Telehealth: Payer: Self-pay | Admitting: Licensed Clinical Social Worker

## 2019-03-15 NOTE — Telephone Encounter (Signed)
Returned phone call from patient with questions about upcoming genetic counseling appointment. Patient wondered what information she needed to bring and we discussed having information about the family history related to cancer diagnoses.

## 2019-03-21 ENCOUNTER — Inpatient Hospital Stay: Payer: Medicare Other

## 2019-03-21 ENCOUNTER — Encounter: Payer: Self-pay | Admitting: Hematology and Oncology

## 2019-03-21 ENCOUNTER — Other Ambulatory Visit: Payer: Self-pay | Admitting: Licensed Clinical Social Worker

## 2019-03-21 ENCOUNTER — Encounter: Payer: Self-pay | Admitting: Licensed Clinical Social Worker

## 2019-03-21 ENCOUNTER — Inpatient Hospital Stay: Payer: Medicare Other | Attending: Gynecologic Oncology | Admitting: Hematology and Oncology

## 2019-03-21 ENCOUNTER — Inpatient Hospital Stay (HOSPITAL_BASED_OUTPATIENT_CLINIC_OR_DEPARTMENT_OTHER): Payer: Medicare Other | Admitting: Licensed Clinical Social Worker

## 2019-03-21 ENCOUNTER — Telehealth: Payer: Self-pay | Admitting: Hematology and Oncology

## 2019-03-21 ENCOUNTER — Other Ambulatory Visit: Payer: Self-pay

## 2019-03-21 DIAGNOSIS — Z86000 Personal history of in-situ neoplasm of breast: Secondary | ICD-10-CM

## 2019-03-21 DIAGNOSIS — T451X5A Adverse effect of antineoplastic and immunosuppressive drugs, initial encounter: Secondary | ICD-10-CM

## 2019-03-21 DIAGNOSIS — C482 Malignant neoplasm of peritoneum, unspecified: Secondary | ICD-10-CM

## 2019-03-21 DIAGNOSIS — Z8041 Family history of malignant neoplasm of ovary: Secondary | ICD-10-CM | POA: Diagnosis not present

## 2019-03-21 DIAGNOSIS — G620 Drug-induced polyneuropathy: Secondary | ICD-10-CM | POA: Diagnosis not present

## 2019-03-21 DIAGNOSIS — Z8042 Family history of malignant neoplasm of prostate: Secondary | ICD-10-CM | POA: Diagnosis not present

## 2019-03-21 DIAGNOSIS — Z8049 Family history of malignant neoplasm of other genital organs: Secondary | ICD-10-CM | POA: Diagnosis not present

## 2019-03-21 DIAGNOSIS — Z800 Family history of malignant neoplasm of digestive organs: Secondary | ICD-10-CM | POA: Insufficient documentation

## 2019-03-21 DIAGNOSIS — Z5111 Encounter for antineoplastic chemotherapy: Secondary | ICD-10-CM | POA: Diagnosis present

## 2019-03-21 DIAGNOSIS — R509 Fever, unspecified: Secondary | ICD-10-CM | POA: Diagnosis not present

## 2019-03-21 DIAGNOSIS — Z808 Family history of malignant neoplasm of other organs or systems: Secondary | ICD-10-CM | POA: Diagnosis not present

## 2019-03-21 DIAGNOSIS — Z7189 Other specified counseling: Secondary | ICD-10-CM | POA: Insufficient documentation

## 2019-03-21 LAB — CMP (CANCER CENTER ONLY)
ALT: 23 U/L (ref 0–44)
AST: 23 U/L (ref 15–41)
Albumin: 4.7 g/dL (ref 3.5–5.0)
Alkaline Phosphatase: 56 U/L (ref 38–126)
Anion gap: 11 (ref 5–15)
BUN: 10 mg/dL (ref 8–23)
CO2: 26 mmol/L (ref 22–32)
Calcium: 9.5 mg/dL (ref 8.9–10.3)
Chloride: 103 mmol/L (ref 98–111)
Creatinine: 0.55 mg/dL (ref 0.44–1.00)
GFR, Est AFR Am: >60 mL/min (ref >60)
GFR, Estimated: >60 mL/min (ref >60)
Glucose, Bld: 84 mg/dL (ref 70–99)
Potassium: 3.7 mmol/L (ref 3.5–5.1)
Sodium: 140 mmol/L (ref 135–145)
Total Bilirubin: 0.7 mg/dL (ref 0.3–1.2)
Total Protein: 7.7 g/dL (ref 6.5–8.1)

## 2019-03-21 LAB — CBC WITH DIFFERENTIAL (CANCER CENTER ONLY)
Abs Immature Granulocytes: 0.01 10*3/uL (ref 0.00–0.07)
Basophils Absolute: 0 10*3/uL (ref 0.0–0.1)
Basophils Relative: 1 %
Eosinophils Absolute: 0.1 10*3/uL (ref 0.0–0.5)
Eosinophils Relative: 2 %
HCT: 34.4 % — ABNORMAL LOW (ref 36.0–46.0)
Hemoglobin: 11.1 g/dL — ABNORMAL LOW (ref 12.0–15.0)
Immature Granulocytes: 0 %
Lymphocytes Relative: 24 %
Lymphs Abs: 1.3 10*3/uL (ref 0.7–4.0)
MCH: 28.8 pg (ref 26.0–34.0)
MCHC: 32.3 g/dL (ref 30.0–36.0)
MCV: 89.4 fL (ref 80.0–100.0)
Monocytes Absolute: 0.5 10*3/uL (ref 0.1–1.0)
Monocytes Relative: 10 %
Neutro Abs: 3.5 10*3/uL (ref 1.7–7.7)
Neutrophils Relative %: 63 %
Platelet Count: 253 10*3/uL (ref 150–400)
RBC: 3.85 MIL/uL — ABNORMAL LOW (ref 3.87–5.11)
RDW: 17.2 % — ABNORMAL HIGH (ref 11.5–15.5)
WBC Count: 5.5 10*3/uL (ref 4.0–10.5)
nRBC: 0 % (ref 0.0–0.2)

## 2019-03-21 LAB — GENETIC SCREENING ORDER

## 2019-03-21 MED ORDER — SODIUM CHLORIDE 0.9% FLUSH
10.0000 mL | Freq: Once | INTRAVENOUS | Status: AC
  Administered 2019-03-21: 10 mL
  Filled 2019-03-21: qty 10

## 2019-03-21 MED ORDER — HEPARIN SOD (PORK) LOCK FLUSH 100 UNIT/ML IV SOLN
500.0000 [IU] | Freq: Once | INTRAVENOUS | Status: AC
  Administered 2019-03-21: 500 [IU]
  Filled 2019-03-21: qty 5

## 2019-03-21 NOTE — Assessment & Plan Note (Signed)
she has mild peripheral neuropathy, likely related to side effects of treatment. °I plan to reduce the dose of treatment as outlined above.  °I explained to the patient the rationale of this strategy and reassured the patient it would not compromise the efficacy of treatment ° °

## 2019-03-21 NOTE — Patient Instructions (Signed)

## 2019-03-21 NOTE — Telephone Encounter (Signed)
Scheduled per 1/4 sch msg. Called and left msg. Mailing printout 

## 2019-03-21 NOTE — Assessment & Plan Note (Signed)
She has numerous questions about plan of care We will resume chemotherapy this week with 3 more cycles of treatment before repeating imaging study in March We will continue close tumor marker monitoring She has appointment to see genetic counselor for genetic testing as well

## 2019-03-21 NOTE — Progress Notes (Signed)
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 I plan to reduce the dose of treatment as outlined above.  I explained to the patient the rationale of this strategy and reassured the patient it would not compromise the efficacy of treatment   Goals of care, counseling/discussion She has numerous questions about goals of care We discussed the role of adjuvant treatment for 3 more cycles before repeating CT imaging If she have complete response at the end of treatment, she will be observed If she tested positive for genetic markers, she would be a candidate for consideration for olaparib maintenance We discussed the risk and benefits of Covid vaccine in the setting of chemotherapy and for now, I do not recommend her to proceed with the vaccination until she is finished with all her treatment   No orders of the defined types were placed in this encounter.   INTERVAL HISTORY: Please see below for problem oriented charting. She returns for resumption of chemotherapy and follow-up Since she had surgery, she felt slightly weaker but overall improving She has minimum incisional pain Her appetite and bowel habits are within normal limits She has noted persistent neuropathy affecting the tips of fingers and toes She has question related to plan of care as well  is Covid vaccine  SUMMARY OF ONCOLOGIC HISTORY: Oncology History  Peritoneal carcinoma ([place])  11/23/2018 Imaging   Ct abdomen and pelvis 1. Findings highly suspicious for peritoneal carcinomatosis. Recommend paracentesis for therapeutic and diagnostic purposes. I do not see an obvious primary lesion but there is some irregular enhancing soft tissue in the right adnexal area. CA 125 level may be helpful. 2. Mild surface irregularity involving the liver but I do not see any obvious changes of cirrhosis. No hepatic lesions.   11/23/2018 Tumor Marker   Patient's tumor was tested for the following markers: CA-125 Results of the tumor marker test revealed 253.   11/25/2018 Initial Diagnosis   Peritoneal carcinoma ([place])   11/25/2018 Imaging   US paracentesis Successful ultrasound-guided paracentesis yielding 3.4 L of peritoneal fluid.   12/01/2018 Cancer Staging   Staging form: Ovary, Fallopian Tube, and Primary Peritoneal Carcinoma, AJCC 8th Edition - Clinical stage from 12/01/2018: cT3, cN0, cM0 - Signed by [name], MD on 12/01/2018   12/02/2018 Tumor Marker   Patient's tumor was tested for the following markers: CA-125 Results of the tumor marker test revealed 267   12/06/2018 -  Chemotherapy   The patient had carboplatin and taxol for chemotherapy treatment.     12/08/2018 Procedure   Successful ultrasound-guided therapeutic paracentesis yielding 1.7 liters of peritoneal fluid.   12/24/2018 Procedure   Successful placement of a right IJ approach Power Port with ultrasound and fluoroscopic guidance. The catheter is ready for use   01/17/2019 Tumor Marker   Patient's tumor was tested for the following markers: CA-125 Results of the tumor marker test revealed 57.2  01/31/2019 Imaging   Significant decrease in peritoneal carcinomatosis since previous study. Interval resolution of ascites.   Focal area of parenchymal consolidation in central right middle lobe, which measures 3 cm.  Differential diagnosis includes infectious or inflammatory process, atelectasis, and neoplasm. Recommend short-term follow-up by chest CT in 2-3 months.     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   Surgeon: Donaciano Eva    Operative Findings:  : grossly normal uterus, ovaries normal, few scattered peritoneal nodules (67mm) on serosa of uterus and tubes. The omentum (gastrocolic) was tethered to the mesentery with tumor (thin rind). Complete (optimal) resection of tumor with no gross residual disease.     02/17/2019 Pathology Results   FINAL MICROSCOPIC DIAGNOSIS: A. UTERUS, CERVIX, BILATERAL FALLOPIAN TUBES AND OVARIES, HYSTERECTOMY WITH SALPINGOOOPHORECTOMY: - Uterus: Endometrium: Inactive endometrium. No hyperplasia or malignancy. Myometrium: Unremarkable. No malignancy. Serosa: Metastatic carcinoma. No malignancy. - Cervix: Benign squamous and endocervical mucosa. No dysplasia or malignancy. - Left ovary and fallopian tube: Metastatic carcinoma. - Right ovary: No malignancy identified. - Right fallopian tube: Luminal tumor, see comment. B. OMENTUM, RESECTION: - High grade serous carcinoma. - Deposits up to at least 3 cm. - See oncology table. ONCOLOGY TABLE: OVARY or FALLOPIAN TUBE or PRIMARY PERITONEUM: Procedure: Hysterectomy with bilateral salpingo-oophorectomy and omentectomy. Specimen Integrity: Intact Tumor Site: Peritoneum Ovarian Surface Involvement (required only if applicable): Left ovary Fallopian Tube Surface Involvement (required only if applicable): Left Fallopian tube Tumor Size: Largest deposit 3 cm Histologic Type: High-grade serous carcinoma Histologic Grade: High-grade Implants (required for advanced stage  serous/seromucinous borderline tumors only): Uterine serosa, left fallopian tube and ovary, omentum Other Tissue/ Organ Involvement: As above Largest Extrapelvic Peritoneal Focus (required only if applicable): 3 cm Peritoneal/Ascitic Fluid: Pre neoadjuvant [id number] positive for carcinoma. Treatment Effect (required only for high-grade serous carcinomas): Probably partial treatment effect in omental tissue. Regional Lymph Nodes: No lymph nodes submitted or found Pathologic Stage Classification (pTNM, AJCC 8th Edition): ypT3c, ypNX Representative Tumor Block: B5 Comment(s): There is surface involvement of the left ovary and fallopian tube but no primary tumor. Within the right fallopian tube lumen there is detached fragments of tumor but again no precursor lesion is noted in the right fallopian tube. Thus, the tumor is presumed primary peritoneal and staged as such.     REVIEW OF SYSTEMS:   Constitutional: Denies fevers, chills or abnormal weight loss Eyes: Denies blurriness of vision Ears, nose, mouth, throat, and face: Denies mucositis or sore throat Respiratory: Denies cough, dyspnea or wheezes Cardiovascular: Denies palpitation, chest discomfort or lower extremity swelling Gastrointestinal:  Denies nausea, heartburn or change in bowel habits Skin: Denies abnormal skin rashes Behavioral/Psych: Mood is stable, no new changes  All other systems were reviewed with the patient and are negative.  I have reviewed the past medical history, past surgical history, social history and family history with the patient and they are unchanged from previous note.  ALLERGIES:  is allergic to fentanyl; milk-related compounds; oxybutynin chloride; thimerosal; tramadol; avelox [moxifloxacin hcl in nacl]; doxycycline; erythromycin; metronidazole; minocycline; penicillins; quinolones; and sulfonamide derivatives.  MEDICATIONS:  Current Outpatient Medications  Medication Sig Dispense Refill  . acetaminophen  (TYLENOL) 500 MG tablet Take 500 mg by mouth every 6 (six) hours as needed for moderate pain or headache.    . ARTIFICIAL TEAR  SOLUTION OP Place 1 drop into both eyes daily as needed (dry eyes).    . Azelaic Acid (FINACEA) 15 % cream Apply 1 application topically daily.    . cromolyn (NASALCROM) 5.2 MG/ACT nasal spray Place 1 spray into the nose as needed for allergies.     Marland Kitchen dexamethasone (DECADRON) 4 MG tablet Take 2 tabs at the night before and 2 tab the morning of chemotherapy, every 3 weeks, by mouth (Patient taking differently: Take 4 mg by mouth See admin instructions. Take 4 mg at the night before and 4 mg the morning of chemotherapy, every 3 weeks, by mouth) 4 tablet 6  . gabapentin (NEURONTIN) 300 MG capsule Take 600 mg by mouth 3 (three) times daily.     Marland Kitchen guaiFENesin (MUCINEX) 600 MG 12 hr tablet Take 600 mg by mouth 2 (two) times daily as needed (congestion).    . Ketotifen Fumarate (ALLERGY EYE DROPS OP) Place 1 drop into both eyes daily as needed (allergies).    Marland Kitchen lidocaine-prilocaine (EMLA) cream Apply to affected area once (Patient taking differently: Apply 1 application topically daily as needed (port access). ) 30 g 3  . loratadine (CLARITIN) 5 MG/5ML syrup Take 5 mg by mouth daily as needed for allergies (bone pain related to chemo).    . mupirocin ointment (BACTROBAN) 2 % Apply 1 application topically as needed. (Patient taking differently: Apply 1 application topically as needed (wound care). ) 22 g 2  . ondansetron (ZOFRAN) 8 MG tablet Take 1 tablet (8 mg total) by mouth every 8 (eight) hours as needed for refractory nausea / vomiting. 30 tablet 1  . prochlorperazine (COMPAZINE) 10 MG tablet Take 1 tablet (10 mg total) by mouth every 6 (six) hours as needed (Nausea or vomiting). 30 tablet 1  . Risedronate Sodium 35 MG TBEC Take 1 tablet (35 mg total) by mouth once a week. (Patient taking differently: Take 1 tablet by mouth every Tuesday. ) 4 tablet 11  . Sulfacetamide Sodium,  Acne, 10 % LOTN Apply 1 application topically at bedtime.     No current facility-administered medications for this visit.    PHYSICAL EXAMINATION: ECOG PERFORMANCE STATUS: 1 - Symptomatic but completely ambulatory  Vitals:   03/21/19 1306  BP: (!) 136/59  Pulse: (!) 105  Resp: 18  Temp: 98.9 F (37.2 C)  SpO2: 100%   Filed Weights   03/21/19 1306  Weight: 102 lb 9.6 oz (46.5 kg)    GENERAL:alert, no distress and comfortable SKIN: skin color, texture, turgor are normal, no rashes or significant lesions EYES: normal, Conjunctiva are pink and non-injected, sclera clear OROPHARYNX:no exudate, no erythema and lips, buccal mucosa, and tongue normal  NECK: supple, thyroid normal size, non-tender, without nodularity LYMPH:  no palpable lymphadenopathy in the cervical, axillary or inguinal LUNGS: clear to auscultation and percussion with normal breathing effort HEART: regular rate & rhythm and no murmurs and no lower extremity edema ABDOMEN:abdomen soft, non-tender and normal bowel sounds.  Well-healed surgical scar Musculoskeletal:no cyanosis of digits and no clubbing  NEURO: alert & oriented x 3 with fluent speech, no focal motor/sensory deficits  LABORATORY DATA:  I have reviewed the data as listed    Component Value Date/Time   NA 137 02/15/2019 0938   K 4.1 02/15/2019 0938   CL 101 02/15/2019 0938   CO2 27 02/15/2019 0938   GLUCOSE 98 02/15/2019 0938   BUN 11 02/15/2019 0938   CREATININE 0.52 02/15/2019 0938   CREATININE 0.61 01/17/2019 1114  CALCIUM 9.7 02/15/2019 0938   PROT 7.9 02/15/2019 0938   ALBUMIN 4.4 02/15/2019 0938   AST 25 02/15/2019 0938   AST 18 01/17/2019 1114   ALT 20 02/15/2019 0938   ALT 19 01/17/2019 1114   ALKPHOS 58 02/15/2019 0938   BILITOT 0.8 02/15/2019 0938   BILITOT 0.2 (L) 01/17/2019 1114   GFRNONAA >60 02/15/2019 0938   GFRNONAA >60 01/17/2019 1114   GFRAA >60 02/15/2019 0938   GFRAA >60 01/17/2019 1114    No results found for:  SPEP, UPEP  Lab Results  Component Value Date   WBC 6.2 02/15/2019   NEUTROABS 13.4 (H) 01/17/2019   HGB 11.6 (L) 02/15/2019   HCT 36.6 02/15/2019   MCV 89.5 02/15/2019   PLT 317 02/15/2019      Chemistry      Component Value Date/Time   NA 137 02/15/2019 0938   K 4.1 02/15/2019 0938   CL 101 02/15/2019 0938   CO2 27 02/15/2019 0938   BUN 11 02/15/2019 0938   CREATININE 0.52 02/15/2019 0938   CREATININE 0.61 01/17/2019 1114      Component Value Date/Time   CALCIUM 9.7 02/15/2019 0938   ALKPHOS 58 02/15/2019 0938   AST 25 02/15/2019 0938   AST 18 01/17/2019 1114   ALT 20 02/15/2019 0938   ALT 19 01/17/2019 1114   BILITOT 0.8 02/15/2019 0938   BILITOT 0.2 (L) 01/17/2019 1114      All questions were answered. The patient knows to call the clinic with any problems, questions or concerns. No barriers to learning was detected.  I spent 25 minutes counseling the patient face to face. The total time spent in the appointment was 30 minutes and more than 50% was on counseling and review of test results  [name], MD 03/21/2019 2:00 PM

## 2019-03-21 NOTE — Progress Notes (Signed)
REFERRING PROVIDER: Everitt Amber, MD [place],  [place] [address]  PRIMARY PROVIDER:  Orpah Melter, MD  PRIMARY REASON FOR VISIT:  1. 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 Family history of ovarian cancer   3. Family history of uterine cancer   4. Family history of melanoma   5. Family history of prostate cancer   6. Family history of pancreatic cancer   7. Family history of colon cancer   8. History of ductal carcinoma in situ (DCIS) of breast     HISTORY OF PRESENT ILLNESS:   [name] [name], a 69 y.o. female, was seen for a [place] cancer genetics consultation at the request of Dr. Denman [name] due to a personal and family history of cancer.  [name] [name] presents to clinic today to discuss the possibility of a hereditary predisposition to cancer, genetic testing, and to further clarify her future cancer risks, as well as potential cancer risks for family members.   At the age of 34, [name] [name] was diagnosed with right breast cancer, treated with lumpectomy and radiation.  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 So far she has had neoadjuvant chemotherapy and underwent TAH-BSO on 02/17/2019, and will have more chemotherapy.  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 Findings highly suspicious for peritoneal carcinomatosis. Recommend paracentesis for therapeutic and diagnostic purposes. I do not see an obvious primary lesion but there is some irregular enhancing soft tissue in the right adnexal area. CA 125 level may be helpful. 2. Mild surface irregularity involving the liver but I do not see any obvious changes of cirrhosis. No hepatic lesions.   11/23/2018 Tumor Marker   Patient's tumor was tested for the following markers: CA-125 Results of the tumor marker test revealed 253.   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   12/01/2018 Cancer Staging   Staging form: Ovary, Fallopian Tube, and Primary Peritoneal Carcinoma, AJCC 8th Edition - Clinical stage from 12/01/2018: cT3, cN0, cM0 - Signed by [name], MD on 12/01/2018   12/02/2018 Tumor Marker   Patient's tumor was tested for the following markers: CA-125 Results of the tumor marker test revealed 267   12/06/2018 -  Chemotherapy   The patient had carboplatin and taxol for chemotherapy treatment.     12/08/2018 Procedure   Successful ultrasound-guided therapeutic paracentesis yielding 1.7 liters of peritoneal fluid.   12/24/2018 Procedure   Successful placement of a right IJ approach Power Port with ultrasound and fluoroscopic guidance. The catheter is ready for use   01/17/2019 Tumor Marker   Patient's tumor was tested for the following markers: CA-125 Results of the tumor marker test revealed 57.2   01/31/2019 Imaging   Significant decrease in peritoneal carcinomatosis since previous study. Interval resolution of ascites.   Focal area of parenchymal consolidation in central right middle lobe, which measures 3 cm. Differential diagnosis includes infectious or inflammatory process, atelectasis, and neoplasm. Recommend short-term follow-up by chest CT in 2-3 months.     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   Surgeon: Donaciano Eva    Operative Findings:  :  grossly normal uterus, ovaries normal, few scattered peritoneal nodules (1m) on serosa of uterus and tubes. The omentum (gastrocolic) was tethered to the mesentery with tumor (thin rind). Complete (optimal) resection of tumor with no gross residual  disease.     02/17/2019 Pathology Results   FINAL MICROSCOPIC DIAGNOSIS: A. UTERUS, CERVIX, BILATERAL FALLOPIAN TUBES AND OVARIES, HYSTERECTOMY WITH SALPINGOOOPHORECTOMY: - Uterus: Endometrium: Inactive endometrium. No hyperplasia or malignancy. Myometrium: Unremarkable. No malignancy. Serosa: Metastatic carcinoma. No malignancy. - Cervix: Benign squamous and endocervical mucosa. No dysplasia or malignancy. - Left ovary and fallopian tube: Metastatic carcinoma. - Right ovary: No malignancy identified. - Right fallopian tube: Luminal tumor, see comment. B. OMENTUM, RESECTION: - High grade serous carcinoma. - Deposits up to at least 3 cm. - See oncology table. ONCOLOGY TABLE: OVARY or FALLOPIAN TUBE or PRIMARY PERITONEUM: Procedure: Hysterectomy with bilateral salpingo-oophorectomy and omentectomy. Specimen Integrity: Intact Tumor Site: Peritoneum Ovarian Surface Involvement (required only if applicable): Left ovary Fallopian Tube Surface Involvement (required only if applicable): Left Fallopian tube Tumor Size: Largest deposit 3 cm Histologic Type: High-grade serous carcinoma Histologic Grade: High-grade Implants (required for advanced stage serous/seromucinous borderline tumors only): Uterine serosa, left fallopian tube and ovary, omentum Other Tissue/ Organ Involvement: As above Largest Extrapelvic Peritoneal Focus (required only if applicable): 3 cm Peritoneal/Ascitic Fluid: Pre neoadjuvant [id number] positive for carcinoma. Treatment Effect (required only for high-grade serous carcinomas): Probably partial treatment effect in omental tissue. Regional Lymph Nodes: No lymph nodes submitted or found Pathologic Stage Classification (pTNM, AJCC 8th Edition): ypT3c, ypNX Representative Tumor Block: B5 Comment(s): There is surface involvement of the left ovary and fallopian tube but no primary tumor. Within the right fallopian tube lumen there is detached fragments of tumor but again no  precursor lesion is noted in the right fallopian tube. Thus, the tumor is presumed primary peritoneal and staged as such.      RISK FACTORS:  Menarche was at age 69  First live birth at age no children.  OCP use for approximately 10 years.  Ovaries intact: no.  Hysterectomy: yes.  Menopausal status: postmenopausal.  HRT use: 0 years. Colonoscopy: yes in 2014, normal.  Mammogram within the last year: yes.   Past Medical History:  Diagnosis Date  . Allergic rhinitis   . Cervical dysplasia 1980  . Cervicalgia   . Eczema    Rosacea,dermatitis-Dr [employer] . Esophageal reflux   . Family history of colon cancer   . Family history of melanoma   . Family history of ovarian cancer   . Family history of pancreatic cancer   . Family history of prostate cancer   . Family history of uterine cancer   . Fibromyalgia   . GERD (gastroesophageal reflux disease)   . Glaucoma, narrow-angle   . Heart murmur   . Hiatal hernia   . Irritable bowel syndrome   . LBBB (left bundle branch block)   . Left bundle branch block   . 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 Mitral valve disorders(424.0)    No antibiotics required  . Osteoporosis 12/2016   T score -2.3, 2014 T score -2.5 AP spine  . PAC (premature atrial contraction) 2012   ,PVC's, and nonsustained atril tachycardia w aberration by heart monitor   . PVC (premature ventricular contraction)   . Rosacea   . Stricture and stenosis of esophagus  Past Surgical History:  Procedure Laterality Date  . BREAST LUMPECTOMY  1998   right breast with radiation  . CERVICAL DISCECTOMY     C3-5 with bone graft  . GYNECOLOGIC CRYOSURGERY  1980  . IR IMAGING GUIDED PORT INSERTION  12/24/2018  . NECK SURGERY  [phone]   bone graft and fusion  . REFRACTIVE SURGERY     narrow angle glucoma  . RHINOPLASTY  1976  . TUBAL LIGATION  1977    Social  History   Socioeconomic History  . Marital status: Married    Spouse name: Antony Haste  . Number of children: 0  . Years of education: Not on file  . Highest education level: Not on file  Occupational History  . Occupation: Teacher, early years/pre: [employer]  Tobacco Use  . Smoking status: Never Smoker  . Smokeless tobacco: Never Used  Substance and Sexual Activity  . Alcohol use: Yes    Alcohol/week: 2.0 standard drinks    Types: 2 Standard drinks or equivalent per week    Comment: Social   . Drug use: No  . Sexual activity: Not Currently    Birth control/protection: Post-menopausal    Comment: 1st intercourse 69 yo- More than 5 partners  Other Topics Concern  . Not on file  Social History Narrative  . Not on file   Social Determinants of Health   Financial Resource Strain:   . Difficulty of Paying Living Expenses: Not on file  Food Insecurity:   . Worried About Charity fundraiser in the Last Year: Not on file  . Ran Out of Food in the Last Year: Not on file  Transportation Needs:   . Lack of Transportation (Medical): Not on file  . Lack of Transportation (Non-Medical): Not on file  Physical Activity:   . Days of Exercise per Week: Not on file  . Minutes of Exercise per Session: Not on file  Stress:   . Feeling of Stress : Not on file  Social Connections:   . Frequency of Communication with Friends and Family: Not on file  . Frequency of Social Gatherings with Friends and Family: Not on file  . Attends Religious Services: Not on file  . Active Member of Clubs or Organizations: Not on file  . Attends Archivist Meetings: Not on file  . Marital Status: Not on file     FAMILY HISTORY:  We obtained a detailed, 4-generation family history.  Significant diagnoses are listed below: Family History  Problem Relation Age of Onset  . Heart disease Mother   . Depression Mother   . Hypertension Mother   . Heart disease Father   . Hypertension Father   .  Aneurysm Father   . Uterine cancer Sister 88  . Hypertension Sister   . Ovarian cancer Sister 71  . Melanoma Sister 61  . Depression Sister   . Melanoma Sister 69  . Heart disease Paternal Grandfather   . Stroke Maternal Grandmother   . Cancer Maternal Grandfather 67       Pancreatic  . Stroke Paternal Grandmother   . Cancer Paternal Uncle        multiple myeloma, prostate cancer and melanoma (separate primaries)  . Colon cancer Neg Hx    [name] [name] does not have children. She has 3 sisters. One sister had ovarian cancer at 57 and vulvar melanoma at 83, and died at 77. Another sister is living at 79 and melanoma at 76,  and her third sister is living at 22 and had endometrial cancer at 62. Patient has 3 nephews and 1 niece.   [name] [name] mother died at 64, no history of cancer. Patient had 1 maternal aunt, no cancer, died in her 52s due to multiple sclerosis. Patient does not have contact with her 2 cousins on this side of the family. Maternal grandfather had pancreatic cancer at 97 and died at 76. Grandmother had stroke and passed in her 5s-70s. This grandmother's father had colon cancer and died at 8.   [name] [name] father died at 53, no cancer. Patient had 1 paternal aunt and 1 paternal uncle. Her uncle had 3 types of cancer: multiple myeloma, prostate cancer and melanoma. No known cancers in paternal cousins. Paternal grandparents both passed approximately in their 73s, with no cancer history.  [name] [name] is unaware of previous family history of genetic testing for hereditary cancer risks.There is no reported Ashkenazi Jewish ancestry. There is no known consanguinity.  GENETIC COUNSELING ASSESSMENT: [name] [name] is a 69 y.o. female with a personal and family history which is somewhat suggestive of a hereditary cancer syndrome and predisposition to cancer. We, therefore, discussed and recommended the following at today's visit.   DISCUSSION: We discussed that 15 - 25% of  ovarian cancer is hereditary, with most cases associated with BRCA1/BRCA2 mutations.  There are other genes that can be associated with hereditary ovarian/peritoneal cancer syndromes.  We discussed that testing is beneficial for several reasons including knowing if the individual is a candidate for targeted therapy, knowing how to follow individuals after completing their treatment, and understand if other family members could be at risk for cancer and allow them to undergo genetic testing.   We reviewed the characteristics, features and inheritance patterns of hereditary cancer syndromes. We also discussed genetic testing, including the appropriate family members to test, the process of testing, insurance coverage and turn-around-time for results. We discussed the implications of a negative, positive and/or variant of uncertain significant result. We recommended [name] [name] pursue genetic testing for the [facility] CancerNext+TumorNext HRD gene panel.   We discussed that genetic testing through [facility] will test for hereditary mutations that could explain her diagnosis of cancer. However, homologous recombination testing (HRD) is genetic testing performed on her tumor that can determine genetic changes that could influence her management.  HRD testing is performed in tandem with genetic testing, and typically at no additional cost.    The CancerNext+RNAinsight gene panel offered by [facility] includes sequencing and rearrangement analysis for the following 36 genes: APC*, ATM*, AXIN2, BARD1, BMPR1A, BRCA1*, BRCA2*, BRIP1*, CDH1*, CDK4, CDKN2A, CHEK2*, DICER1, MLH1*, MSH2*, MSH3, MSH6*, MUTYH*, NBN, NF1*, NTHL1, PALB2*, PMS2*, PTEN*, RAD51C*, RAD51D*, RECQL, SMAD4, SMARCA4, STK11 and TP53* (sequencing and deletion/duplication); HOXB13, POLD1 and POLE (sequencing only); EPCAM and GREM1 (deletion/duplication only). DNA and RNA analyses performed for * genes.   Somatic genes analyzed through TumorNext-HRD:  ATM, BARD1, BRCA1, BRCA2, BRIP1, CHEK2, MRE11A, NBN, PALB2, RAD51C, RAD51D.  Based on [name] [name]'s personal and family history of cancer, she meets medical criteria for genetic testing. Despite that she meets criteria, she may still have an out of pocket cost. We discussed that if her out of pocket cost for testing is over $100, the laboratory will call and confirm whether she wants to proceed with testing.  If the out of pocket cost of testing is less than $100 she will be billed by the genetic testing laboratory.   PLAN: After considering the risks, benefits,  and limitations, [name] [name] provided informed consent to pursue genetic testing and the blood sample was sent to [facility] for analysis of the CancerNext+TumorNextHRD panel. Results should be available within approximately 6-8 weeks' time, at which point they will be disclosed by telephone to [name] [name], as will any additional recommendations warranted by these results. [name] [name] will receive a summary of her genetic counseling visit and a copy of her results once available. This information will also be available in Epic.   [name] [name] questions were answered to her satisfaction today. Our contact information was provided should additional questions or concerns arise. Thank you for the referral and allowing [name] to share in the care of your patient.   Faith Rogue, MS, [facility] Genetic Counselor [email or web address] Phone: [phone]  The patient was seen for a total of 30 minutes in face-to-face genetic counseling.  Drs. Magrinat, Lindi Adie and/or Burr Medico were available for discussion regarding this case.   _______________________________________________________________________ For Office Staff:  Number of people involved in session: 1 Was an Intern/ student involved with case: no

## 2019-03-21 NOTE — Assessment & Plan Note (Signed)
She has numerous questions about goals of care We discussed the role of adjuvant treatment for 3 more cycles before repeating CT imaging If she have complete response at the end of treatment, she will be observed If she tested positive for genetic markers, she would be a candidate for consideration for olaparib maintenance We discussed the risk and benefits of Covid vaccine in the setting of chemotherapy and for now, I do not recommend her to proceed with the vaccination until she is finished with all her treatment

## 2019-03-22 ENCOUNTER — Telehealth: Payer: Self-pay | Admitting: *Deleted

## 2019-03-22 ENCOUNTER — Other Ambulatory Visit: Payer: Self-pay | Admitting: Hematology and Oncology

## 2019-03-22 LAB — CA 125: Cancer Antigen (CA) 125: 15.1 U/mL (ref 0.0–38.1)

## 2019-03-22 NOTE — Telephone Encounter (Signed)
-----   Message from [name], MD sent at 03/22/2019  8:16 AM EST ----- Regarding: pls let her know CA-125 is now nromal and labs are fine

## 2019-03-22 NOTE — Telephone Encounter (Signed)
Telephone call to patient to advise lab results as directed below.

## 2019-03-23 ENCOUNTER — Inpatient Hospital Stay (HOSPITAL_BASED_OUTPATIENT_CLINIC_OR_DEPARTMENT_OTHER): Payer: Medicare Other | Admitting: Medical

## 2019-03-23 ENCOUNTER — Telehealth: Payer: Self-pay | Admitting: *Deleted

## 2019-03-23 ENCOUNTER — Other Ambulatory Visit: Payer: Self-pay

## 2019-03-23 ENCOUNTER — Other Ambulatory Visit: Payer: Self-pay | Admitting: Emergency Medicine

## 2019-03-23 ENCOUNTER — Telehealth: Payer: Self-pay | Admitting: Hematology and Oncology

## 2019-03-23 ENCOUNTER — Inpatient Hospital Stay: Payer: Medicare Other

## 2019-03-23 VITALS — BP 134/62 | HR 104 | Temp 99.6°F | Resp 18 | Ht 66.0 in | Wt 103.2 lb

## 2019-03-23 DIAGNOSIS — Z5111 Encounter for antineoplastic chemotherapy: Secondary | ICD-10-CM | POA: Diagnosis not present

## 2019-03-23 DIAGNOSIS — R509 Fever, unspecified: Secondary | ICD-10-CM | POA: Diagnosis not present

## 2019-03-23 DIAGNOSIS — C482 Malignant neoplasm of peritoneum, unspecified: Secondary | ICD-10-CM

## 2019-03-23 LAB — CBC WITH DIFFERENTIAL (CANCER CENTER ONLY)
Abs Immature Granulocytes: 0.01 10*3/uL (ref 0.00–0.07)
Basophils Absolute: 0 10*3/uL (ref 0.0–0.1)
Basophils Relative: 1 %
Eosinophils Absolute: 0 10*3/uL (ref 0.0–0.5)
Eosinophils Relative: 0 %
HCT: 34.6 % — ABNORMAL LOW (ref 36.0–46.0)
Hemoglobin: 11.5 g/dL — ABNORMAL LOW (ref 12.0–15.0)
Immature Granulocytes: 0 %
Lymphocytes Relative: 22 %
Lymphs Abs: 0.8 10*3/uL (ref 0.7–4.0)
MCH: 29 pg (ref 26.0–34.0)
MCHC: 33.2 g/dL (ref 30.0–36.0)
MCV: 87.2 fL (ref 80.0–100.0)
Monocytes Absolute: 0.2 10*3/uL (ref 0.1–1.0)
Monocytes Relative: 7 %
Neutro Abs: 2.4 10*3/uL (ref 1.7–7.7)
Neutrophils Relative %: 70 %
Platelet Count: 184 10*3/uL (ref 150–400)
RBC: 3.97 MIL/uL (ref 3.87–5.11)
RDW: 16.6 % — ABNORMAL HIGH (ref 11.5–15.5)
WBC Count: 3.4 10*3/uL — ABNORMAL LOW (ref 4.0–10.5)
nRBC: 0 % (ref 0.0–0.2)

## 2019-03-23 LAB — URINALYSIS, COMPLETE (UACMP) WITH MICROSCOPIC
Bacteria, UA: NONE SEEN
Bilirubin Urine: NEGATIVE
Glucose, UA: NEGATIVE mg/dL
Ketones, ur: NEGATIVE mg/dL
Leukocytes,Ua: NEGATIVE
Nitrite: NEGATIVE
Protein, ur: NEGATIVE mg/dL
Specific Gravity, Urine: 1.014 (ref 1.005–1.030)
pH: 6 (ref 5.0–8.0)

## 2019-03-23 LAB — CMP (CANCER CENTER ONLY)
ALT: 34 U/L (ref 0–44)
AST: 45 U/L — ABNORMAL HIGH (ref 15–41)
Albumin: 4.3 g/dL (ref 3.5–5.0)
Alkaline Phosphatase: 65 U/L (ref 38–126)
Anion gap: 12 (ref 5–15)
BUN: 10 mg/dL (ref 8–23)
CO2: 23 mmol/L (ref 22–32)
Calcium: 8.9 mg/dL (ref 8.9–10.3)
Chloride: 102 mmol/L (ref 98–111)
Creatinine: 0.71 mg/dL (ref 0.44–1.00)
GFR, Est AFR Am: >60 mL/min (ref >60)
GFR, Estimated: >60 mL/min (ref >60)
Glucose, Bld: 109 mg/dL — ABNORMAL HIGH (ref 70–99)
Potassium: 3.8 mmol/L (ref 3.5–5.1)
Sodium: 137 mmol/L (ref 135–145)
Total Bilirubin: 0.3 mg/dL (ref 0.3–1.2)
Total Protein: 7.4 g/dL (ref 6.5–8.1)

## 2019-03-23 MED ORDER — CEFUROXIME AXETIL 500 MG PO TABS: 500.0000 mg | ORAL_TABLET | Freq: Two times a day (BID) | ORAL | 0 refills | Status: DC

## 2019-03-23 NOTE — Telephone Encounter (Signed)
Discussed symptoms with [facility] nurse and provider. Ok to add to schedule. Scheduling message sent and patient notified of appts. She expressed great appreciation and confirms appt for 1230/1pm.

## 2019-03-23 NOTE — Telephone Encounter (Signed)
Patient called to report a fever that started last night. It was 101.2 late last night. She returned home from her appts yesterday and felt horrible. Nothing specific just "bad in general". She states she went right to bed. She woke up in the evening, had chills and felt flush. She took her temperature and it was 101.2. She took a Tylenol and went back to bed.   This morning she still feels poorly, no appetite and the thought of food makes her nauseous. Her temperature this morning is 100.3. She denies any nausea, vomiting, urinary symptoms or respiratory symptoms. She is concerned about coming in tomorrow with the fever.

## 2019-03-23 NOTE — Telephone Encounter (Signed)
Her recent work-up is normal Can she be seen at symptom management clinic today? Otherwise I can also work her in tomorrow morning and see her at 8 am Take tylenol and anti-emetics as prescribed

## 2019-03-23 NOTE — Telephone Encounter (Signed)
Scheduled appt per 1/6 sch message - pt is aware of appt date and time   

## 2019-03-23 NOTE — Telephone Encounter (Signed)
Pt called per visit today with Lucianne Lei in [facility] - chemo scheduled for 1/7 has been rescheduled to 1/13.  She is not scheduled for lab prior to infusion ( note she is scheduled for 730 am). " will I need a lab for chemo on 1/13?  She also stated she was informed her following chemo from the 13th will be in over 3 weeks (26 days vs 21 days ) " is it ok "  This RN informed pt her lab concerns will be forwarded to MD for review. If labs are needed on 1/13 - nurse in infusion room may need to draw when they access port due to early appointment.  No other issues at this time.  This message will be forwarded to MD for further recommendations.

## 2019-03-23 NOTE — Patient Instructions (Signed)

## 2019-03-24 ENCOUNTER — Other Ambulatory Visit: Payer: Self-pay | Admitting: Hematology and Oncology

## 2019-03-24 ENCOUNTER — Ambulatory Visit: Payer: Medicare Other

## 2019-03-24 LAB — URINE CULTURE: Culture: NO GROWTH

## 2019-03-24 NOTE — Telephone Encounter (Signed)
Her labs from yesterday is fine No need to repeat labs since she has been off treatment since surgery and I do not anticipate any changes

## 2019-03-24 NOTE — Telephone Encounter (Signed)
Telephone call to patient to discuss labs. Patient understands she will not need labs and to come 1/13. Patient states she is still feeling the same but has not had any fevers today. She will continue to monitor these and call us with any concerns.

## 2019-03-25 ENCOUNTER — Telehealth: Payer: Self-pay | Admitting: Medical

## 2019-03-25 ENCOUNTER — Telehealth: Payer: Self-pay | Admitting: *Deleted

## 2019-03-25 MED ORDER — ALBUTEROL SULFATE HFA 108 (90 BASE) MCG/ACT IN AERS: 2.0000 | INHALATION_SPRAY | Freq: Four times a day (QID) | RESPIRATORY_TRACT | 1 refills | Status: DC | PRN

## 2019-03-25 NOTE — Progress Notes (Signed)
[facility] Progress Note   [name] [name] [id number] 04/18/1950 69 y.o.  [name] [name] is managed by Dr. [name]  Actively treated with chemotherapy/immunotherapy/hormonal therapy: yes  Current therapy: Carboplatin and paclitaxel  Next scheduled appointment with provider: 03/24/2019  Assessment: Plan:    Peritoneal carcinoma ([place])  Fever, unspecified fever cause - Plan: cefUROXime (CEFTIN) 500 MG tablet, albuterol (VENTOLIN HFA) 108 (90 Base) MCG/ACT inhaler   Peritoneal carcinoma: The patient is managed by Dr. [name] and is treated with carboplatin and paclitaxel.  She is status post cycle 3 of chemotherapy and was scheduled to return to clinic on 03/23/2018 (cycle 4 of chemotherapy.  This will be delayed by 1 week given the patient's fevers.  Fever: Blood cultures x2, urine, urine culture, CBC, and chemistry panel collected today.  Patient CBC returned with a WBC of 3.4 and an ANC of 2.4.  She was given a prescription of Ceftin 500 mg p.o. twice daily x7 days.  She was told to continue using Tylenol as needed.  She was asked to call back later this week for follow-up if she was still febrile.  Please see After Visit Summary for patient specific instructions.  Future Appointments  Date Time Provider [place]  03/30/2019  7:30 AM [facility] INFUSION [facility] None  04/25/2019  8:45 AM [facility] LAB 1 [facility] None  04/25/2019  9:00 AM [facility] [place] FLUSH [facility] None  04/25/2019  9:45 AM Alvy Bimler, Ni, MD [facility] None  04/25/2019 11:00 AM [facility] INFUSION [facility] None  05/16/2019  9:15 AM [facility] LAB 3 [facility] None  05/16/2019  9:30 AM [facility] [place] FLUSH [facility] None  05/16/2019 10:00 AM Alvy Bimler, Ni, MD [facility] None  05/16/2019 11:00 AM [facility] INFUSION [facility] None  09/08/2019 11:00 AM Larey Days, MD [facility]    No orders of the defined types were placed in this encounter.      Subjective:    Patient ID:  [name] [name] is a 69 y.o. (DOB 1951-01-27) female.  Chief Complaint:  Chief Complaint  Patient presents with  . Fever    HPI MARIGOLD MOM  Is a 69 y.o. female with a diagnosis of a peritoneal carcinoma.  She is managed by Dr. [name] and is treated with carboplatin and paclitaxel.  She was scheduled to return tomorrow for cycle 4, day 1 of chemotherapy.  She reports that she has had a fever of 101 to as high as 101.2 since Monday.  She has been using Tylenol.  Her temperature this morning was 99.5.  She has anorexia, fatigue, and chills.  She denies redness, tenderness, or swelling around her port.  She also denies chest pain, shortness of breath, cough, nausea, vomiting, or diarrhea.  No one else in her home has been sick.  She has been limiting her time out of her home as much as possible  Medications: I have reviewed the patient's current medications.  Allergies:  Allergies  Allergen Reactions  . Fentanyl Palpitations    Palpitations , fever ,headahe, N/V  " loud pounding heart beat"   . Milk-Related Compounds Diarrhea and Nausea And Vomiting  . Oxybutynin Chloride Other (See Comments)    Ditropan  Pt doesn't remember   . Thimerosal Other (See Comments)    Pt doesn't remember  . Tramadol Other (See Comments)    Pt doesn't remember  . Avelox [Moxifloxacin Hcl In Nacl] Rash  . Doxycycline Rash  . Erythromycin Rash  . Metronidazole Rash  .  Minocycline Rash  . Penicillins Rash    Did it involve swelling of the face/tongue/throat, SOB, or low BP? No Did it involve sudden or severe rash/hives, skin peeling, or any reaction on the inside of your mouth or nose? No Did you need to seek medical attention at a hospital or doctor's office? No When did it last happen?Childhood allergy If all above answers are "NO", may proceed with cephalosporin use.   . Quinolones Rash  . Sulfonamide Derivatives Rash    Past Medical History:  Diagnosis Date  .  Allergic rhinitis   . Cervical dysplasia 1980  . Cervicalgia   . Eczema    Rosacea,dermatitis-Dr [employer]  . Esophageal reflux   . Family history of colon cancer   . Family history of melanoma   . Family history of ovarian cancer   . Family history of pancreatic cancer   . Family history of prostate cancer   . Family history of uterine cancer   . Fibromyalgia   . GERD (gastroesophageal reflux disease)   . Glaucoma, narrow-angle   . Heart murmur   . Hiatal hernia   . Irritable bowel syndrome   . LBBB (left bundle branch block)   . Left bundle branch block   . 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 Mitral valve disorders(424.0)    No antibiotics required  . Osteoporosis 12/2016   T score -2.3, 2014 T score -2.5 AP spine  . PAC (premature atrial contraction) 2012   ,PVC's, and nonsustained atril tachycardia w aberration by heart monitor   . PVC (premature ventricular contraction)   . Rosacea   . Stricture and stenosis of esophagus     Past Surgical History:  Procedure Laterality Date  . BREAST LUMPECTOMY  1998   right breast with radiation  . CERVICAL DISCECTOMY     C3-5 with bone graft  . GYNECOLOGIC CRYOSURGERY  1980  . IR IMAGING GUIDED PORT INSERTION  12/24/2018  . NECK SURGERY  [phone]   bone graft and fusion  . REFRACTIVE SURGERY     narrow angle glucoma  . RHINOPLASTY  1976  . TUBAL LIGATION  1977    Family History  Problem Relation Age of Onset  . Heart disease Mother   . Depression Mother   . Hypertension Mother   . Heart disease Father   . Hypertension Father   . Aneurysm Father   . Uterine cancer Sister 35  . Hypertension Sister   . Ovarian cancer Sister 32  . Melanoma Sister 7  . Depression Sister   . Melanoma Sister 88  . Heart disease Paternal Grandfather   . Stroke Maternal Grandmother   . Cancer Maternal Grandfather 69       Pancreatic  .  Stroke Paternal Grandmother   . Cancer Paternal Uncle        multiple myeloma, prostate cancer and melanoma (separate primaries)  . Colon cancer Neg Hx     Social History   Socioeconomic History  . Marital status: Married    Spouse name: Antony Haste  . Number of children: 0  . Years of education: Not on file  . Highest education level: Not on file  Occupational History  . Occupation: Teacher, early years/pre: [employer]  Tobacco Use  . Smoking status: Never Smoker  . Smokeless tobacco: Never Used  Substance and Sexual  Activity  . Alcohol use: Yes    Alcohol/week: 2.0 standard drinks    Types: 2 Standard drinks or equivalent per week    Comment: Social   . Drug use: No  . Sexual activity: Not Currently    Birth control/protection: Post-menopausal    Comment: 1st intercourse 69 yo- More than 5 partners  Other Topics Concern  . Not on file  Social History Narrative  . Not on file   Social Determinants of Health   Financial Resource Strain:   . Difficulty of Paying Living Expenses: Not on file  Food Insecurity:   . Worried About Charity fundraiser in the Last Year: Not on file  . Ran Out of Food in the Last Year: Not on file  Transportation Needs:   . Lack of Transportation (Medical): Not on file  . Lack of Transportation (Non-Medical): Not on file  Physical Activity:   . Days of Exercise per Week: Not on file  . Minutes of Exercise per Session: Not on file  Stress:   . Feeling of Stress : Not on file  Social Connections:   . Frequency of Communication with Friends and Family: Not on file  . Frequency of Social Gatherings with Friends and Family: Not on file  . Attends Religious Services: Not on file  . Active Member of Clubs or Organizations: Not on file  . Attends Archivist Meetings: Not on file  . Marital Status: Not on file  Intimate Partner Violence:   . Fear of Current or Ex-Partner: Not on file  . Emotionally Abused: Not on file  .  Physically Abused: Not on file  . Sexually Abused: Not on file    Past Medical History, Surgical history, Social history, and Family history were reviewed and updated as appropriate.   Please see review of systems for further details on the patient's review from today.   Review of Systems:  Review of Systems  Constitutional: Positive for appetite change, chills, fatigue and fever. Negative for diaphoresis and unexpected weight change.  HENT: Negative for trouble swallowing.   Respiratory: Negative for cough, chest tightness and shortness of breath.   Cardiovascular: Negative for chest pain and palpitations.  Gastrointestinal: Negative for abdominal pain, constipation, diarrhea, nausea and vomiting.  Genitourinary: Negative for difficulty urinating, dysuria and frequency.  Skin: Negative for color change and rash.  Neurological: Negative for dizziness and headaches.    Objective:   Physical Exam:  BP 134/62 (BP Location: Left Arm, Patient Position: Sitting)   Pulse (!) 104   Temp 99.6 F (37.6 C) (Oral)   Resp 18   Ht _0  (1.676 m)   Wt 103 lb 3.2 oz (46.8 kg)   SpO2 100%   BMI 16.66 kg/m  ECOG: 0  Physical Exam Constitutional:      General: She is not in acute distress.    Appearance: She is not diaphoretic.  HENT:     Head: Normocephalic and atraumatic.     Mouth/Throat:     Mouth: Mucous membranes are moist.     Pharynx: No oropharyngeal exudate or posterior oropharyngeal erythema.  Eyes:     General: No scleral icterus.       Right eye: No discharge.        Left eye: No discharge.     Conjunctiva/sclera: Conjunctivae normal.  Cardiovascular:     Rate and Rhythm: Normal rate and regular rhythm.     Heart sounds: Normal heart sounds. No murmur.  No friction rub. No gallop.   Pulmonary:     Effort: Pulmonary effort is normal. No respiratory distress.     Breath sounds: Normal breath sounds. No wheezing or rales.  Chest:    Abdominal:     General: Abdomen is  flat. Bowel sounds are normal. There is no distension.     Palpations: Abdomen is soft.     Tenderness: There is no abdominal tenderness. There is no guarding.  Musculoskeletal:     Cervical back: Normal range of motion and neck supple. No rigidity or tenderness.  Lymphadenopathy:     Cervical: No cervical adenopathy.     Upper Body:     Right upper body: No supraclavicular, axillary or epitrochlear adenopathy.     Left upper body: No supraclavicular, axillary or epitrochlear adenopathy.  Skin:    General: Skin is warm and dry.     Findings: No erythema or rash.  Neurological:     Mental Status: She is alert.     Lab Review:     Component Value Date/Time   NA 137 03/23/2019 1210   K 3.8 03/23/2019 1210   CL 102 03/23/2019 1210   CO2 23 03/23/2019 1210   GLUCOSE 109 (H) 03/23/2019 1210   BUN 10 03/23/2019 1210   CREATININE 0.71 03/23/2019 1210   CALCIUM 8.9 03/23/2019 1210   PROT 7.4 03/23/2019 1210   ALBUMIN 4.3 03/23/2019 1210   AST 45 (H) 03/23/2019 1210   ALT 34 03/23/2019 1210   ALKPHOS 65 03/23/2019 1210   BILITOT 0.3 03/23/2019 1210   GFRNONAA >60 03/23/2019 1210   GFRAA >60 03/23/2019 1210       Component Value Date/Time   WBC 3.4 (L) 03/23/2019 1210   WBC 6.2 02/15/2019 0938   RBC 3.97 03/23/2019 1210   HGB 11.5 (L) 03/23/2019 1210   HCT 34.6 (L) 03/23/2019 1210   PLT 184 03/23/2019 1210   MCV 87.2 03/23/2019 1210   MCH 29.0 03/23/2019 1210   MCHC 33.2 03/23/2019 1210   RDW 16.6 (H) 03/23/2019 1210   LYMPHSABS 0.8 03/23/2019 1210   MONOABS 0.2 03/23/2019 1210   EOSABS 0.0 03/23/2019 1210   BASOSABS 0.0 03/23/2019 1210   -------------------------------  Imaging from last 24 hours (if applicable):  Radiology interpretation: No results found.

## 2019-03-25 NOTE — Telephone Encounter (Signed)
The patient reported that she was still having a low-grade fever of up to 100.1.  She has a mild cough and continues to have some fatigue.  She is agreeable to continue with Ceftin as prescribed.  An albuterol inhaler was sent to her pharmacy.

## 2019-03-25 NOTE — Telephone Encounter (Signed)
-----   Message from [name], MD sent at 03/25/2019  8:18 AM EST ----- Regarding: she saw Lucianne Lei 2 days ago. can you call and check on her? is she feeling better?

## 2019-03-25 NOTE — Telephone Encounter (Signed)
VM left on [facility] line by patient asking to speak with PA Lucianne Lei as a f/u from 03/23/19 [facility] visit about fevers/fatigue.  PA Lucianne Lei states he will call pt back.  Desk RN Clarise Cruz aware that PA Lucianne Lei is also f/u with patient today by phone.

## 2019-03-25 NOTE — Telephone Encounter (Signed)
Telephone call to patient. She is feeling a little better. She still reports she feels "crummy" no specific symptoms, just overall does not feel well. She has been monitoring her temperature and she has not had a fever since Wednesday. She knows to call this office or go to the nearest ER if her symptoms change or she starts to feel worse.

## 2019-03-28 ENCOUNTER — Telehealth: Payer: Self-pay

## 2019-03-28 LAB — CULTURE, BLOOD (SINGLE)
Culture: NO GROWTH
Culture: NO GROWTH

## 2019-03-28 NOTE — Telephone Encounter (Signed)
Called and given below message. She verbalized understanding. 

## 2019-03-28 NOTE — Telephone Encounter (Signed)
She called and left a message to call her.  Called back. She is scheduled for chemo 1/13. Her husband works in a office and 3 people are out with Covid. He was around those people, but wore a mask and is careful. No symptoms for Covid for her husband and he is still working. She denies symptoms. She is still taking antibiotic and will finish the last one on 1/13.   Is she okay for appt for infusion on 1/13? Her next appt after 1/13 is 2/8, this is not 21 days.

## 2019-03-28 NOTE — Telephone Encounter (Signed)
She should get her treatment as scheduled Her next month appt was scheduled that way because we move her treatment to Mondays

## 2019-03-30 ENCOUNTER — Other Ambulatory Visit: Payer: Self-pay

## 2019-03-30 ENCOUNTER — Inpatient Hospital Stay: Payer: Medicare Other

## 2019-03-30 VITALS — BP 120/63 | HR 99 | Temp 98.9°F | Resp 16 | Ht 65.0 in

## 2019-03-30 DIAGNOSIS — Z5111 Encounter for antineoplastic chemotherapy: Secondary | ICD-10-CM | POA: Diagnosis not present

## 2019-03-30 DIAGNOSIS — C482 Malignant neoplasm of peritoneum, unspecified: Secondary | ICD-10-CM

## 2019-03-30 MED ORDER — DIPHENHYDRAMINE HCL 50 MG/ML IJ SOLN
25.0000 mg | Freq: Once | INTRAMUSCULAR | Status: AC
  Administered 2019-03-30: 08:00:00 25 mg via INTRAVENOUS

## 2019-03-30 MED ORDER — SODIUM CHLORIDE 0.9 % IV SOLN
131.2500 mg/m2 | Freq: Once | INTRAVENOUS | Status: AC
  Administered 2019-03-30: 09:00:00 198 mg via INTRAVENOUS
  Filled 2019-03-30: qty 33

## 2019-03-30 MED ORDER — SODIUM CHLORIDE 0.9 % IV SOLN
Freq: Once | INTRAVENOUS | Status: AC
  Filled 2019-03-30: qty 5

## 2019-03-30 MED ORDER — PALONOSETRON HCL INJECTION 0.25 MG/5ML
0.2500 mg | Freq: Once | INTRAVENOUS | Status: AC
  Administered 2019-03-30: 0.25 mg via INTRAVENOUS

## 2019-03-30 MED ORDER — FAMOTIDINE IN NACL 20-0.9 MG/50ML-% IV SOLN
INTRAVENOUS | Status: AC
  Filled 2019-03-30: qty 50

## 2019-03-30 MED ORDER — DIPHENHYDRAMINE HCL 50 MG/ML IJ SOLN
INTRAMUSCULAR | Status: AC
  Filled 2019-03-30: qty 1

## 2019-03-30 MED ORDER — PALONOSETRON HCL INJECTION 0.25 MG/5ML
INTRAVENOUS | Status: AC
  Filled 2019-03-30: qty 5

## 2019-03-30 MED ORDER — HEPARIN SOD (PORK) LOCK FLUSH 100 UNIT/ML IV SOLN
500.0000 [IU] | Freq: Once | INTRAVENOUS | Status: AC | PRN
  Administered 2019-03-30: 500 [IU]
  Filled 2019-03-30: qty 5

## 2019-03-30 MED ORDER — FAMOTIDINE IN NACL 20-0.9 MG/50ML-% IV SOLN
20.0000 mg | Freq: Once | INTRAVENOUS | Status: AC
  Administered 2019-03-30: 08:00:00 20 mg via INTRAVENOUS

## 2019-03-30 MED ORDER — SODIUM CHLORIDE 0.9 % IV SOLN
403.8000 mg | Freq: Once | INTRAVENOUS | Status: AC
  Administered 2019-03-30: 12:00:00 400 mg via INTRAVENOUS
  Filled 2019-03-30: qty 40

## 2019-03-30 MED ORDER — SODIUM CHLORIDE 0.9% FLUSH
10.0000 mL | INTRAVENOUS | Status: DC | PRN
  Administered 2019-03-30: 13:00:00 10 mL
  Filled 2019-03-30: qty 10

## 2019-03-30 MED ORDER — SODIUM CHLORIDE 0.9 % IV SOLN
Freq: Once | INTRAVENOUS | Status: AC
  Filled 2019-03-30: qty 250

## 2019-03-30 NOTE — Patient Instructions (Signed)
[facility] Discharge Instructions for Patients Receiving Chemotherapy  Today you received the following chemotherapy agents Paclitaxel (TAXOL) & Carboplatin (PARAPLATIN).  To help prevent nausea and vomiting after your treatment, we encourage you to take your nausea medication as prescribed.   If you develop nausea and vomiting that is not controlled by your nausea medication, call the clinic.   BELOW ARE SYMPTOMS THAT SHOULD BE REPORTED IMMEDIATELY:  *FEVER GREATER THAN 100.5 F  *CHILLS WITH OR WITHOUT FEVER  NAUSEA AND VOMITING THAT IS NOT CONTROLLED WITH YOUR NAUSEA MEDICATION  *UNUSUAL SHORTNESS OF BREATH  *UNUSUAL BRUISING OR BLEEDING  TENDERNESS IN MOUTH AND THROAT WITH OR WITHOUT PRESENCE OF ULCERS  *URINARY PROBLEMS  *BOWEL PROBLEMS  UNUSUAL RASH Items with * indicate a potential emergency and should be followed up as soon as possible.  Feel free to call the clinic should you have any questions or concerns. The clinic phone number is [phone].  Please show the CHEMO ALERT CARD at check-in to the Emergency Department and triage nurse.  Coronavirus (COVID-19) Are you at risk?  Are you at risk for the Coronavirus (COVID-19)?  To be considered HIGH RISK for Coronavirus (COVID-19), you have to meet the following criteria:  . Traveled to [place], [place], [name], [place] or [place]; or in the [place] to [place], [place], [place], or [place]; and have fever, cough, and shortness of breath within the last 2 weeks of travel OR . Been in close contact with a person diagnosed with COVID-19 within the last 2 weeks and have fever, cough, and shortness of breath . IF YOU DO NOT MEET THESE CRITERIA, YOU ARE CONSIDERED LOW RISK FOR COVID-19.  What to do if you are HIGH RISK for COVID-19?  . If you are having a medical emergency, call 911. . Seek medical care right away. Before you go to a doctor's office, urgent care or emergency department,  call ahead and tell them about your recent travel, contact with someone diagnosed with COVID-19, and your symptoms. You should receive instructions from your physician's office regarding next steps of care.  . When you arrive at healthcare provider, tell the healthcare staff immediately you have returned from visiting [place], [place], [place], [place] or [name]; or traveled in the [place] to [place], [place], [place], or [place]; in the last two weeks or you have been in close contact with a person diagnosed with COVID-19 in the last 2 weeks.   . Tell the health care staff about your symptoms: fever, cough and shortness of breath. . After you have been seen by a medical provider, you will be either: o Tested for (COVID-19) and discharged home on quarantine except to seek medical care if symptoms worsen, and asked to  - Stay home and avoid contact with others until you get your results (4-5 days)  - Avoid travel on public transportation if possible (such as bus, train, or airplane) or o Sent to the Emergency Department by EMS for evaluation, COVID-19 testing, and possible admission depending on your condition and test results.  What to do if you are LOW RISK for COVID-19?  Reduce your risk of any infection by using the same precautions used for avoiding the common cold or flu:  . Wash your hands often with soap and warm water for at least 20 seconds.  If soap and water are not readily available, use an alcohol-based hand sanitizer with at least 60% alcohol.  .   If coughing or sneezing, cover your mouth and nose by coughing or sneezing into the elbow areas of your shirt or coat, into a tissue or into your sleeve (not your hands). . Avoid shaking hands with others and consider head nods or verbal greetings only. . Avoid touching your eyes, nose, or mouth with unwashed hands.  . Avoid close contact with people who are sick. . Avoid places or events with large numbers of people in one  location, like concerts or sporting events. . Carefully consider travel plans you have or are making. . If you are planning any travel outside or inside the [place], visit the CDC's Travelers' Health webpage for the latest health notices. . If you have some symptoms but not all symptoms, continue to monitor at home and seek medical attention if your symptoms worsen. . If you are having a medical emergency, call 911.   ADDITIONAL HEALTHCARE OPTIONS FOR PATIENTS  [facility] / e-Visit: [email or web address]         [facility]: [phone]  [facility]: [phone]                   [facility]: [phone]   

## 2019-04-01 ENCOUNTER — Ambulatory Visit: Payer: Medicare Other

## 2019-04-12 MED FILL — DEXAMETHASONE 4 MG TABLET: 4 | 21 days supply | Qty: 4 | Fill #4

## 2019-04-12 MED FILL — LIDOCAINE-PRILOCAINE CREAM: 2.5-2.5 | 15 days supply | Qty: 30 | Fill #1

## 2019-04-14 ENCOUNTER — Ambulatory Visit: Payer: Medicare Other | Admitting: Hematology and Oncology

## 2019-04-14 ENCOUNTER — Ambulatory Visit: Payer: Medicare Other

## 2019-04-14 ENCOUNTER — Other Ambulatory Visit: Payer: Medicare Other

## 2019-04-18 ENCOUNTER — Ambulatory Visit: Payer: Medicare Other

## 2019-04-18 ENCOUNTER — Other Ambulatory Visit: Payer: Medicare Other

## 2019-04-18 ENCOUNTER — Ambulatory Visit: Payer: Medicare Other | Admitting: Hematology and Oncology

## 2019-04-22 ENCOUNTER — Other Ambulatory Visit: Payer: Self-pay | Admitting: Hematology and Oncology

## 2019-04-25 ENCOUNTER — Inpatient Hospital Stay: Payer: Medicare Other | Attending: Gynecologic Oncology

## 2019-04-25 ENCOUNTER — Inpatient Hospital Stay (HOSPITAL_BASED_OUTPATIENT_CLINIC_OR_DEPARTMENT_OTHER): Payer: Medicare Other | Admitting: Hematology and Oncology

## 2019-04-25 ENCOUNTER — Inpatient Hospital Stay: Payer: Medicare Other

## 2019-04-25 ENCOUNTER — Encounter: Payer: Self-pay | Admitting: Hematology and Oncology

## 2019-04-25 ENCOUNTER — Other Ambulatory Visit: Payer: Self-pay

## 2019-04-25 VITALS — HR 93

## 2019-04-25 DIAGNOSIS — C482 Malignant neoplasm of peritoneum, unspecified: Secondary | ICD-10-CM

## 2019-04-25 DIAGNOSIS — M797 Fibromyalgia: Secondary | ICD-10-CM | POA: Insufficient documentation

## 2019-04-25 DIAGNOSIS — K1231 Oral mucositis (ulcerative) due to antineoplastic therapy: Secondary | ICD-10-CM | POA: Insufficient documentation

## 2019-04-25 DIAGNOSIS — G620 Drug-induced polyneuropathy: Secondary | ICD-10-CM | POA: Diagnosis not present

## 2019-04-25 DIAGNOSIS — Z5111 Encounter for antineoplastic chemotherapy: Secondary | ICD-10-CM | POA: Insufficient documentation

## 2019-04-25 DIAGNOSIS — D6481 Anemia due to antineoplastic chemotherapy: Secondary | ICD-10-CM | POA: Diagnosis not present

## 2019-04-25 DIAGNOSIS — T451X5A Adverse effect of antineoplastic and immunosuppressive drugs, initial encounter: Secondary | ICD-10-CM

## 2019-04-25 LAB — CMP (CANCER CENTER ONLY)
ALT: 18 U/L (ref 0–44)
AST: 19 U/L (ref 15–41)
Albumin: 4.5 g/dL (ref 3.5–5.0)
Alkaline Phosphatase: 85 U/L (ref 38–126)
Anion gap: 14 (ref 5–15)
BUN: 12 mg/dL (ref 8–23)
CO2: 25 mmol/L (ref 22–32)
Calcium: 9.8 mg/dL (ref 8.9–10.3)
Chloride: 100 mmol/L (ref 98–111)
Creatinine: 0.71 mg/dL (ref 0.44–1.00)
GFR, Est AFR Am: >60 mL/min (ref >60)
GFR, Estimated: >60 mL/min (ref >60)
Glucose, Bld: 135 mg/dL — ABNORMAL HIGH (ref 70–99)
Potassium: 3.9 mmol/L (ref 3.5–5.1)
Sodium: 139 mmol/L (ref 135–145)
Total Bilirubin: 0.2 mg/dL — ABNORMAL LOW (ref 0.3–1.2)
Total Protein: 8.3 g/dL — ABNORMAL HIGH (ref 6.5–8.1)

## 2019-04-25 LAB — CBC WITH DIFFERENTIAL (CANCER CENTER ONLY)
Abs Immature Granulocytes: 0.08 10*3/uL — ABNORMAL HIGH (ref 0.00–0.07)
Basophils Absolute: 0 10*3/uL (ref 0.0–0.1)
Basophils Relative: 0 %
Eosinophils Absolute: 0 10*3/uL (ref 0.0–0.5)
Eosinophils Relative: 0 %
HCT: 35 % — ABNORMAL LOW (ref 36.0–46.0)
Hemoglobin: 11.6 g/dL — ABNORMAL LOW (ref 12.0–15.0)
Immature Granulocytes: 1 %
Lymphocytes Relative: 7 %
Lymphs Abs: 0.7 10*3/uL (ref 0.7–4.0)
MCH: 29.1 pg (ref 26.0–34.0)
MCHC: 33.1 g/dL (ref 30.0–36.0)
MCV: 87.9 fL (ref 80.0–100.0)
Monocytes Absolute: 0.1 10*3/uL (ref 0.1–1.0)
Monocytes Relative: 1 %
Neutro Abs: 9.9 10*3/uL — ABNORMAL HIGH (ref 1.7–7.7)
Neutrophils Relative %: 91 %
Platelet Count: 351 10*3/uL (ref 150–400)
RBC: 3.98 MIL/uL (ref 3.87–5.11)
RDW: 15.7 % — ABNORMAL HIGH (ref 11.5–15.5)
WBC Count: 10.8 10*3/uL — ABNORMAL HIGH (ref 4.0–10.5)
nRBC: 0 % (ref 0.0–0.2)

## 2019-04-25 MED ORDER — DIPHENHYDRAMINE HCL 50 MG/ML IJ SOLN
25.0000 mg | Freq: Once | INTRAMUSCULAR | Status: AC
  Administered 2019-04-25: 11:00:00 25 mg via INTRAVENOUS

## 2019-04-25 MED ORDER — SODIUM CHLORIDE 0.9% FLUSH
10.0000 mL | INTRAVENOUS | Status: DC | PRN
  Administered 2019-04-25: 10 mL
  Filled 2019-04-25: qty 10

## 2019-04-25 MED ORDER — SODIUM CHLORIDE 0.9 % IV SOLN
Freq: Once | INTRAVENOUS | Status: AC
  Filled 2019-04-25: qty 5

## 2019-04-25 MED ORDER — DIPHENHYDRAMINE HCL 50 MG/ML IJ SOLN
INTRAMUSCULAR | Status: AC
  Filled 2019-04-25: qty 1

## 2019-04-25 MED ORDER — HEPARIN SOD (PORK) LOCK FLUSH 100 UNIT/ML IV SOLN
500.0000 [IU] | Freq: Once | INTRAVENOUS | Status: AC | PRN
  Administered 2019-04-25: 16:00:00 500 [IU]
  Filled 2019-04-25: qty 5

## 2019-04-25 MED ORDER — SODIUM CHLORIDE 0.9 % IV SOLN: 481.5000 mg | Freq: Once | INTRAVENOUS | Status: DC

## 2019-04-25 MED ORDER — SODIUM CHLORIDE 0.9 % IV SOLN
116.6667 mg/m2 | Freq: Once | INTRAVENOUS | Status: AC
  Administered 2019-04-25: 12:00:00 174 mg via INTRAVENOUS
  Filled 2019-04-25: qty 29

## 2019-04-25 MED ORDER — PALONOSETRON HCL INJECTION 0.25 MG/5ML
INTRAVENOUS | Status: AC
  Filled 2019-04-25: qty 5

## 2019-04-25 MED ORDER — SODIUM CHLORIDE 0.9 % IV SOLN
382.2000 mg | Freq: Once | INTRAVENOUS | Status: AC
  Administered 2019-04-25: 380 mg via INTRAVENOUS
  Filled 2019-04-25: qty 38

## 2019-04-25 MED ORDER — SODIUM CHLORIDE 0.9 % IV SOLN
Freq: Once | INTRAVENOUS | Status: AC
  Filled 2019-04-25: qty 250

## 2019-04-25 MED ORDER — FAMOTIDINE IN NACL 20-0.9 MG/50ML-% IV SOLN
20.0000 mg | Freq: Once | INTRAVENOUS | Status: AC
  Administered 2019-04-25: 20 mg via INTRAVENOUS

## 2019-04-25 MED ORDER — FAMOTIDINE IN NACL 20-0.9 MG/50ML-% IV SOLN
INTRAVENOUS | Status: AC
  Filled 2019-04-25: qty 50

## 2019-04-25 MED ORDER — PALONOSETRON HCL INJECTION 0.25 MG/5ML
0.2500 mg | Freq: Once | INTRAVENOUS | Status: AC
  Administered 2019-04-25: 11:00:00 0.25 mg via INTRAVENOUS

## 2019-04-25 NOTE — Progress Notes (Signed)
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[facility]) She had multiple different side effects with her last treatment including mucositis and mild worsening neuropathy Her calculated carboplatin dose is lower today We will proceed with low-dose carboplatin and I plan to reduce the dose of Taxol further I will see her again in 3 weeks for further follow-up  Mucositis due to chemotherapy We discussed conservative approach I recommend minor dose adjustment  Peripheral neuropathy due to chemotherapy ([place]) I plan to reduce the dose of chemotherapy further and I explained to her it will not jeopardize the effectiveness of treatment  Anemia due to antineoplastic chemotherapy The anemia is somewhat improved compared to prior visit Observe for now   No orders of the defined types were placed in this encounter.   All questions were answered. The patient knows to call the clinic with any problems, questions or concerns. The total time spent in the appointment was 30 minutes encounter with patients including review of chart and various tests results, discussions about plan of care and coordination of care plan   [name], MD 04/25/2019 10:37 AM  INTERVAL HISTORY: Please see below for problem oriented charting. She returns for cycle 5 of chemotherapy Since last time I saw her, she noticed some slight worsening neuropathy especially in her toes She also had some mucositis that lasted for about 2 weeks No recent nausea or constipation She noticed some rosacea with a flareup for the first 2 days She felt somewhat weak on day 3 and 4 after feeling good on the first 2 days of treatment She also have some mild worsening flare of her fibromyalgia  SUMMARY OF ONCOLOGIC  HISTORY: Oncology History  Peritoneal carcinoma ([place])  11/23/2018 Imaging   Ct abdomen and pelvis 1. Findings highly suspicious for peritoneal carcinomatosis. Recommend paracentesis for therapeutic and diagnostic purposes. I do not see an obvious primary lesion but there is some irregular enhancing soft tissue in the right adnexal area. CA 125 level may be helpful. 2. Mild surface irregularity involving the liver but I do not see any obvious changes of cirrhosis. No hepatic lesions.   11/23/2018 Tumor Marker   Patient's tumor was tested for the following markers: CA-125 Results of the tumor marker test revealed 253.   11/25/2018 Initial Diagnosis   Peritoneal carcinoma ([place])   11/25/2018 Imaging   US paracentesis Successful ultrasound-guided paracentesis yielding 3.4 L of peritoneal fluid.   12/01/2018 Cancer Staging   Staging form: Ovary, Fallopian Tube, and Primary Peritoneal Carcinoma, AJCC 8th Edition - Clinical stage from 12/01/2018: cT3, cN0, cM0 - Signed by [name], MD on 12/01/2018   12/02/2018 Tumor Marker   Patient's tumor was tested for the following markers: CA-125 Results of the tumor marker test revealed 267   12/06/2018 -  Chemotherapy   The patient had carboplatin and taxol for chemotherapy treatment.     12/08/2018 Procedure   Successful ultrasound-guided therapeutic paracentesis yielding 1.7 liters of peritoneal fluid.   12/24/2018 Procedure   Successful placement of a right IJ approach Power Port with ultrasound and fluoroscopic guidance. The catheter is ready for use   01/17/2019 Tumor Marker   Patient's tumor was tested for the following markers: CA-125 Results of the tumor marker test revealed 57.2   01/31/2019 Imaging  Significant decrease in peritoneal carcinomatosis since previous study. Interval resolution of ascites.   Focal area of parenchymal consolidation in central right middle lobe, which measures 3 cm. Differential diagnosis includes infectious or  inflammatory process, atelectasis, and neoplasm. Recommend short-term follow-up by chest CT in 2-3 months.     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   Surgeon: Donaciano Eva    Operative Findings:  : grossly normal uterus, ovaries normal, few scattered peritoneal nodules (17mm) on serosa of uterus and tubes. The omentum (gastrocolic) was tethered to the mesentery with tumor (thin rind). Complete (optimal) resection of tumor with no gross residual disease.     02/17/2019 Pathology Results   FINAL MICROSCOPIC DIAGNOSIS: A. UTERUS, CERVIX, BILATERAL FALLOPIAN TUBES AND OVARIES, HYSTERECTOMY WITH SALPINGOOOPHORECTOMY: - Uterus: Endometrium: Inactive endometrium. No hyperplasia or malignancy. Myometrium: Unremarkable. No malignancy. Serosa: Metastatic carcinoma. No malignancy. - Cervix: Benign squamous and endocervical mucosa. No dysplasia or malignancy. - Left ovary and fallopian tube: Metastatic carcinoma. - Right ovary: No malignancy identified. - Right fallopian tube: Luminal tumor, see comment. B. OMENTUM, RESECTION: - High grade serous carcinoma. - Deposits up to at least 3 cm. - See oncology table. ONCOLOGY TABLE: OVARY or FALLOPIAN TUBE or PRIMARY PERITONEUM: Procedure: Hysterectomy with bilateral salpingo-oophorectomy and omentectomy. Specimen Integrity: Intact Tumor Site: Peritoneum Ovarian Surface Involvement (required only if applicable): Left ovary Fallopian Tube Surface Involvement (required only if applicable): Left Fallopian tube Tumor Size: Largest deposit 3 cm Histologic Type: High-grade serous carcinoma Histologic Grade: High-grade Implants (required for advanced stage serous/seromucinous borderline tumors only):  Uterine serosa, left fallopian tube and ovary, omentum Other Tissue/ Organ Involvement: As above Largest Extrapelvic Peritoneal Focus (required only if applicable): 3 cm Peritoneal/Ascitic Fluid: Pre neoadjuvant [id number] positive for carcinoma. Treatment Effect (required only for high-grade serous carcinomas): Probably partial treatment effect in omental tissue. Regional Lymph Nodes: No lymph nodes submitted or found Pathologic Stage Classification (pTNM, AJCC 8th Edition): ypT3c, ypNX Representative Tumor Block: B5 Comment(s): There is surface involvement of the left ovary and fallopian tube but no primary tumor. Within the right fallopian tube lumen there is detached fragments of tumor but again no precursor lesion is noted in the right fallopian tube. Thus, the tumor is presumed primary peritoneal and staged as such.   03/21/2019 Tumor Marker   Patient's tumor was tested for the following markers: CA-125 Results of the tumor marker test revealed 15.1     REVIEW OF SYSTEMS:   Constitutional: Denies fevers, chills or abnormal weight loss Eyes: Denies blurriness of vision Respiratory: Denies cough, dyspnea or wheezes Cardiovascular: Denies palpitation, chest discomfort or lower extremity swelling Gastrointestinal:  Denies nausea, heartburn or change in bowel habits Skin: Denies abnormal skin rashes Lymphatics: Denies new lymphadenopathy or easy bruising Behavioral/Psych: Mood is stable, no new changes  All other systems were reviewed with the patient and are negative.  I have reviewed the past medical history, past surgical history, social history and family history with the patient and they are unchanged from previous note.  ALLERGIES:  is allergic to fentanyl; milk-related compounds; oxybutynin chloride; thimerosal; tramadol; avelox [moxifloxacin hcl in nacl]; doxycycline; erythromycin; metronidazole; minocycline; penicillins; quinolones; and sulfonamide derivatives.  MEDICATIONS:   Current Outpatient Medications  Medication Sig Dispense Refill  . acetaminophen (TYLENOL) 500 MG tablet Take 500 mg by mouth every  6 (six) hours as needed for moderate pain or headache.    . albuterol (VENTOLIN HFA) 108 (90 Base) MCG/ACT inhaler Inhale 2 puffs into the lungs every 6 (six) hours as needed for wheezing or shortness of breath. 18 g 1  . ARTIFICIAL TEAR SOLUTION OP Place 1 drop into both eyes daily as needed (dry eyes).    . Azelaic Acid (FINACEA) 15 % cream Apply 1 application topically daily.    . cefUROXime (CEFTIN) 500 MG tablet Take 1 tablet (500 mg total) by mouth 2 (two) times daily with a meal. 14 tablet 0  . cromolyn (NASALCROM) 5.2 MG/ACT nasal spray Place 1 spray into the nose as needed for allergies.     Marland Kitchen dexamethasone (DECADRON) 4 MG tablet Take 2 tabs at the night before and 2 tab the morning of chemotherapy, every 3 weeks, by mouth (Patient taking differently: Take 4 mg by mouth See admin instructions. Take 4 mg at the night before and 4 mg the morning of chemotherapy, every 3 weeks, by mouth) 4 tablet 6  . gabapentin (NEURONTIN) 300 MG capsule Take 600 mg by mouth 3 (three) times daily.     Marland Kitchen guaiFENesin (MUCINEX) 600 MG 12 hr tablet Take 600 mg by mouth 2 (two) times daily as needed (congestion).    . Ketotifen Fumarate (ALLERGY EYE DROPS OP) Place 1 drop into both eyes daily as needed (allergies).    Marland Kitchen lidocaine-prilocaine (EMLA) cream Apply to affected area once (Patient taking differently: Apply 1 application topically daily as needed (port access). ) 30 g 3  . loratadine (CLARITIN) 5 MG/5ML syrup Take 5 mg by mouth daily as needed for allergies (bone pain related to chemo).    . mupirocin ointment (BACTROBAN) 2 % Apply 1 application topically as needed. (Patient taking differently: Apply 1 application topically as needed (wound care). ) 22 g 2  . ondansetron (ZOFRAN) 8 MG tablet Take 1 tablet (8 mg total) by mouth every 8 (eight) hours as needed for refractory  nausea / vomiting. 30 tablet 1  . prochlorperazine (COMPAZINE) 10 MG tablet Take 1 tablet (10 mg total) by mouth every 6 (six) hours as needed (Nausea or vomiting). 30 tablet 1  . Risedronate Sodium 35 MG TBEC Take 1 tablet (35 mg total) by mouth once a week. (Patient taking differently: Take 1 tablet by mouth every Tuesday. ) 4 tablet 11  . Sulfacetamide Sodium, Acne, 10 % LOTN Apply 1 application topically at bedtime.     No current facility-administered medications for this visit.   Facility-Administered Medications Ordered in Other Visits  Medication Dose Route Frequency Provider Last Rate Last Admin  . CARBOplatin (PARAPLATIN) 480 mg in sodium chloride 0.9 % 250 mL chemo infusion  480 mg Intravenous Once Alvy Bimler, Linwood Gullikson, MD      . diphenhydrAMINE (BENADRYL) injection 25 mg  25 mg Intravenous Once Alvy Bimler, Javonda Suh, MD      . famotidine (PEPCID) IVPB 20 mg premix  20 mg Intravenous Once Alvy Bimler, Sorayah Schrodt, MD      . fosaprepitant (EMEND) 150 mg, dexamethasone (DECADRON) 12 mg in sodium chloride 0.9 % 145 mL IVPB   Intravenous Once Alvy Bimler, Zhyon Antenucci, MD      . heparin lock flush 100 unit/mL  500 Units Intracatheter Once PRN Alvy Bimler, Climmie Cronce, MD      . PACLitaxel (TAXOL) 174 mg in sodium chloride 0.9 % 250 mL chemo infusion (> 80mg /m2)  BB:[id number] mg/m2 (Treatment Plan Recorded) Intravenous Once [name], MD      .  sodium chloride flush (NS) 0.9 % injection 10 mL  10 mL Intracatheter PRN Alvy Bimler, Ashland Osmer, MD        PHYSICAL EXAMINATION: ECOG PERFORMANCE STATUS: 1 - Symptomatic but completely ambulatory  Vitals:   04/25/19 0922  BP: 127/65  Pulse: (!) 111  Resp: 18  Temp: 97.9 F (36.6 C)  SpO2: 98%   Filed Weights   04/25/19 0922  Weight: 101 lb 14.4 oz (46.2 kg)    GENERAL:alert, no distress and comfortable SKIN: skin color, texture, turgor are normal, no rashes or significant lesions EYES: normal, Conjunctiva are pink and non-injected, sclera clear OROPHARYNX:no exudate, no erythema and lips, buccal  mucosa, and tongue normal  NECK: supple, thyroid normal size, non-tender, without nodularity LYMPH:  no palpable lymphadenopathy in the cervical, axillary or inguinal LUNGS: clear to auscultation and percussion with normal breathing effort HEART: regular rate & rhythm and no murmurs and no lower extremity edema ABDOMEN:abdomen soft, non-tender and normal bowel sounds Musculoskeletal:no cyanosis of digits and no clubbing  NEURO: alert & oriented x 3 with fluent speech, no focal motor/sensory deficits  LABORATORY DATA:  I have reviewed the data as listed    Component Value Date/Time   NA 139 04/25/2019 0845   K 3.9 04/25/2019 0845   CL 100 04/25/2019 0845   CO2 25 04/25/2019 0845   GLUCOSE 135 (H) 04/25/2019 0845   BUN 12 04/25/2019 0845   CREATININE 0.71 04/25/2019 0845   CALCIUM 9.8 04/25/2019 0845   PROT 8.3 (H) 04/25/2019 0845   ALBUMIN 4.5 04/25/2019 0845   AST 19 04/25/2019 0845   ALT 18 04/25/2019 0845   ALKPHOS 85 04/25/2019 0845   BILITOT 0.2 (L) 04/25/2019 0845   GFRNONAA >60 04/25/2019 0845   GFRAA >60 04/25/2019 0845    No results found for: SPEP, UPEP  Lab Results  Component Value Date   WBC 10.8 (H) 04/25/2019   NEUTROABS 9.9 (H) 04/25/2019   HGB 11.6 (L) 04/25/2019   HCT 35.0 (L) 04/25/2019   MCV 87.9 04/25/2019   PLT 351 04/25/2019      Chemistry      Component Value Date/Time   NA 139 04/25/2019 0845   K 3.9 04/25/2019 0845   CL 100 04/25/2019 0845   CO2 25 04/25/2019 0845   BUN 12 04/25/2019 0845   CREATININE 0.71 04/25/2019 0845      Component Value Date/Time   CALCIUM 9.8 04/25/2019 0845   ALKPHOS 85 04/25/2019 0845   AST 19 04/25/2019 0845   ALT 18 04/25/2019 0845   BILITOT 0.2 (L) 04/25/2019 0845

## 2019-04-25 NOTE — Patient Instructions (Signed)
[facility] Discharge Instructions for Patients Receiving Chemotherapy  Today you received the following chemotherapy agents: Paclitaxel, Carboplatin  To help prevent nausea and vomiting after your treatment, we encourage you to take your nausea medication as directed by your MD.   If you develop nausea and vomiting that is not controlled by your nausea medication, call the clinic.   BELOW ARE SYMPTOMS THAT SHOULD BE REPORTED IMMEDIATELY:  *FEVER GREATER THAN 100.5 F  *CHILLS WITH OR WITHOUT FEVER  NAUSEA AND VOMITING THAT IS NOT CONTROLLED WITH YOUR NAUSEA MEDICATION  *UNUSUAL SHORTNESS OF BREATH  *UNUSUAL BRUISING OR BLEEDING  TENDERNESS IN MOUTH AND THROAT WITH OR WITHOUT PRESENCE OF ULCERS  *URINARY PROBLEMS  *BOWEL PROBLEMS  UNUSUAL RASH Items with * indicate a potential emergency and should be followed up as soon as possible.  Feel free to call the clinic should you have any questions or concerns. The clinic phone number is [phone].  Please show the CHEMO ALERT CARD at check-in to the Emergency Department and triage nurse.  Coronavirus (COVID-19) Are you at risk?  Are you at risk for the Coronavirus (COVID-19)?  To be considered HIGH RISK for Coronavirus (COVID-19), you have to meet the following criteria:  . Traveled to [place], [place], [name], [place] or [place]; or in the [place] to [place], [place], [place], or [place]; and have fever, cough, and shortness of breath within the last 2 weeks of travel OR . Been in close contact with a person diagnosed with COVID-19 within the last 2 weeks and have fever, cough, and shortness of breath . IF YOU DO NOT MEET THESE CRITERIA, YOU ARE CONSIDERED LOW RISK FOR COVID-19.  What to do if you are HIGH RISK for COVID-19?  . If you are having a medical emergency, call 911. . Seek medical care right away. Before you go to a doctor's office, urgent care or emergency department, call ahead and  tell them about your recent travel, contact with someone diagnosed with COVID-19, and your symptoms. You should receive instructions from your physician's office regarding next steps of care.  . When you arrive at healthcare provider, tell the healthcare staff immediately you have returned from visiting [place], [place], [place], [place] or [name]; or traveled in the [place] to [place], [place], [place], or [place]; in the last two weeks or you have been in close contact with a person diagnosed with COVID-19 in the last 2 weeks.   . Tell the health care staff about your symptoms: fever, cough and shortness of breath. . After you have been seen by a medical provider, you will be either: o Tested for (COVID-19) and discharged home on quarantine except to seek medical care if symptoms worsen, and asked to  - Stay home and avoid contact with others until you get your results (4-5 days)  - Avoid travel on public transportation if possible (such as bus, train, or airplane) or o Sent to the Emergency Department by EMS for evaluation, COVID-19 testing, and possible admission depending on your condition and test results.  What to do if you are LOW RISK for COVID-19?  Reduce your risk of any infection by using the same precautions used for avoiding the common cold or flu:  . Wash your hands often with soap and warm water for at least 20 seconds.  If soap and water are not readily available, use an alcohol-based hand sanitizer with at least 60% alcohol.  .   If coughing or sneezing, cover your mouth and nose by coughing or sneezing into the elbow areas of your shirt or coat, into a tissue or into your sleeve (not your hands). . Avoid shaking hands with others and consider head nods or verbal greetings only. . Avoid touching your eyes, nose, or mouth with unwashed hands.  . Avoid close contact with people who are sick. . Avoid places or events with large numbers of people in one location, like  concerts or sporting events. . Carefully consider travel plans you have or are making. . If you are planning any travel outside or inside the [place], visit the CDC's Travelers' Health webpage for the latest health notices. . If you have some symptoms but not all symptoms, continue to monitor at home and seek medical attention if your symptoms worsen. . If you are having a medical emergency, call 911.   ADDITIONAL HEALTHCARE OPTIONS FOR PATIENTS  [place] Telehealth / e-Visit: [email or web address]         [facility]: [phone]  [facility]: [phone]                   [facility]: [phone]  

## 2019-04-25 NOTE — Assessment & Plan Note (Signed)
I plan to reduce the dose of chemotherapy further and I explained to her it will not jeopardize the effectiveness of treatment

## 2019-04-25 NOTE — Assessment & Plan Note (Signed)
She had multiple different side effects with her last treatment including mucositis and mild worsening neuropathy Her calculated carboplatin dose is lower today We will proceed with low-dose carboplatin and I plan to reduce the dose of Taxol further I will see her again in 3 weeks for further follow-up

## 2019-04-25 NOTE — Assessment & Plan Note (Signed)
The anemia is somewhat improved compared to prior visit Observe for now

## 2019-04-25 NOTE — Assessment & Plan Note (Signed)
We discussed conservative approach I recommend minor dose adjustment

## 2019-04-25 NOTE — Progress Notes (Signed)
Per Dr. Alvy Bimler, use Carboplatin dose of 380mg  today (AUC=6).   Demetrius Charity, PharmD, [facility] Pharmacist Pharmacy Phone: [phone] 04/25/2019

## 2019-05-09 ENCOUNTER — Ambulatory Visit: Payer: Medicare Other

## 2019-05-09 ENCOUNTER — Other Ambulatory Visit: Payer: Medicare Other

## 2019-05-09 ENCOUNTER — Ambulatory Visit: Payer: Medicare Other | Admitting: Hematology and Oncology

## 2019-05-09 MED FILL — DEXAMETHASONE 4 MG TABLET: 4 | 21 days supply | Qty: 4 | Fill #5

## 2019-05-13 ENCOUNTER — Telehealth: Payer: Self-pay | Admitting: Licensed Clinical Social Worker

## 2019-05-13 ENCOUNTER — Other Ambulatory Visit: Payer: Self-pay | Admitting: Hematology and Oncology

## 2019-05-16 ENCOUNTER — Other Ambulatory Visit: Payer: Self-pay | Admitting: Hematology and Oncology

## 2019-05-16 ENCOUNTER — Inpatient Hospital Stay: Payer: Medicare Other

## 2019-05-16 ENCOUNTER — Encounter: Payer: Self-pay | Admitting: Licensed Clinical Social Worker

## 2019-05-16 ENCOUNTER — Ambulatory Visit: Payer: Self-pay | Admitting: Licensed Clinical Social Worker

## 2019-05-16 ENCOUNTER — Inpatient Hospital Stay (HOSPITAL_BASED_OUTPATIENT_CLINIC_OR_DEPARTMENT_OTHER): Payer: Medicare Other | Admitting: Hematology and Oncology

## 2019-05-16 ENCOUNTER — Telehealth: Payer: Self-pay | Admitting: Hematology and Oncology

## 2019-05-16 ENCOUNTER — Other Ambulatory Visit: Payer: Self-pay

## 2019-05-16 ENCOUNTER — Inpatient Hospital Stay: Payer: Medicare Other | Attending: Gynecologic Oncology

## 2019-05-16 VITALS — BP 136/66 | HR 125 | Temp 98.9°F | Resp 18 | Ht 65.0 in | Wt 102.6 lb

## 2019-05-16 DIAGNOSIS — K449 Diaphragmatic hernia without obstruction or gangrene: Secondary | ICD-10-CM | POA: Diagnosis not present

## 2019-05-16 DIAGNOSIS — D6481 Anemia due to antineoplastic chemotherapy: Secondary | ICD-10-CM | POA: Insufficient documentation

## 2019-05-16 DIAGNOSIS — Z8049 Family history of malignant neoplasm of other genital organs: Secondary | ICD-10-CM

## 2019-05-16 DIAGNOSIS — K2100 Gastro-esophageal reflux disease with esophagitis, without bleeding: Secondary | ICD-10-CM

## 2019-05-16 DIAGNOSIS — G620 Drug-induced polyneuropathy: Secondary | ICD-10-CM | POA: Diagnosis not present

## 2019-05-16 DIAGNOSIS — K219 Gastro-esophageal reflux disease without esophagitis: Secondary | ICD-10-CM | POA: Insufficient documentation

## 2019-05-16 DIAGNOSIS — Z808 Family history of malignant neoplasm of other organs or systems: Secondary | ICD-10-CM

## 2019-05-16 DIAGNOSIS — C482 Malignant neoplasm of peritoneum, unspecified: Secondary | ICD-10-CM

## 2019-05-16 DIAGNOSIS — R000 Tachycardia, unspecified: Secondary | ICD-10-CM

## 2019-05-16 DIAGNOSIS — Z1379 Encounter for other screening for genetic and chromosomal anomalies: Secondary | ICD-10-CM

## 2019-05-16 DIAGNOSIS — L719 Rosacea, unspecified: Secondary | ICD-10-CM

## 2019-05-16 DIAGNOSIS — Z5111 Encounter for antineoplastic chemotherapy: Secondary | ICD-10-CM | POA: Insufficient documentation

## 2019-05-16 DIAGNOSIS — Z8041 Family history of malignant neoplasm of ovary: Secondary | ICD-10-CM

## 2019-05-16 DIAGNOSIS — Z86000 Personal history of in-situ neoplasm of breast: Secondary | ICD-10-CM

## 2019-05-16 DIAGNOSIS — R911 Solitary pulmonary nodule: Secondary | ICD-10-CM | POA: Diagnosis not present

## 2019-05-16 DIAGNOSIS — Z8042 Family history of malignant neoplasm of prostate: Secondary | ICD-10-CM

## 2019-05-16 DIAGNOSIS — T451X5A Adverse effect of antineoplastic and immunosuppressive drugs, initial encounter: Secondary | ICD-10-CM

## 2019-05-16 DIAGNOSIS — Z800 Family history of malignant neoplasm of digestive organs: Secondary | ICD-10-CM

## 2019-05-16 LAB — CMP (CANCER CENTER ONLY)
ALT: 16 U/L (ref 0–44)
AST: 19 U/L (ref 15–41)
Albumin: 4.5 g/dL (ref 3.5–5.0)
Alkaline Phosphatase: 77 U/L (ref 38–126)
Anion gap: 12 (ref 5–15)
BUN: 11 mg/dL (ref 8–23)
CO2: 26 mmol/L (ref 22–32)
Calcium: 10.1 mg/dL (ref 8.9–10.3)
Chloride: 102 mmol/L (ref 98–111)
Creatinine: 0.65 mg/dL (ref 0.44–1.00)
GFR, Est AFR Am: >60 mL/min (ref >60)
GFR, Estimated: >60 mL/min (ref >60)
Glucose, Bld: 125 mg/dL — ABNORMAL HIGH (ref 70–99)
Potassium: 3.9 mmol/L (ref 3.5–5.1)
Sodium: 140 mmol/L (ref 135–145)
Total Bilirubin: 0.2 mg/dL — ABNORMAL LOW (ref 0.3–1.2)
Total Protein: 8.3 g/dL — ABNORMAL HIGH (ref 6.5–8.1)

## 2019-05-16 LAB — CBC WITH DIFFERENTIAL (CANCER CENTER ONLY)
Abs Immature Granulocytes: 0.04 10*3/uL (ref 0.00–0.07)
Basophils Absolute: 0 10*3/uL (ref 0.0–0.1)
Basophils Relative: 0 %
Eosinophils Absolute: 0 10*3/uL (ref 0.0–0.5)
Eosinophils Relative: 0 %
HCT: 34.3 % — ABNORMAL LOW (ref 36.0–46.0)
Hemoglobin: 11.3 g/dL — ABNORMAL LOW (ref 12.0–15.0)
Immature Granulocytes: 0 %
Lymphocytes Relative: 6 %
Lymphs Abs: 0.7 10*3/uL (ref 0.7–4.0)
MCH: 28.7 pg (ref 26.0–34.0)
MCHC: 32.9 g/dL (ref 30.0–36.0)
MCV: 87.1 fL (ref 80.0–100.0)
Monocytes Absolute: 0.1 10*3/uL (ref 0.1–1.0)
Monocytes Relative: 1 %
Neutro Abs: 11.1 10*3/uL — ABNORMAL HIGH (ref 1.7–7.7)
Neutrophils Relative %: 93 %
Platelet Count: 323 10*3/uL (ref 150–400)
RBC: 3.94 MIL/uL (ref 3.87–5.11)
RDW: 15.4 % (ref 11.5–15.5)
WBC Count: 12 10*3/uL — ABNORMAL HIGH (ref 4.0–10.5)
nRBC: 0 % (ref 0.0–0.2)

## 2019-05-16 MED ORDER — SODIUM CHLORIDE 0.9 % IV SOLN
Freq: Once | INTRAVENOUS | Status: AC
  Filled 2019-05-16: qty 250

## 2019-05-16 MED ORDER — HEPARIN SOD (PORK) LOCK FLUSH 100 UNIT/ML IV SOLN
500.0000 [IU] | Freq: Once | INTRAVENOUS | Status: AC | PRN
  Administered 2019-05-16: 17:00:00 500 [IU]
  Filled 2019-05-16: qty 5

## 2019-05-16 MED ORDER — DIPHENHYDRAMINE HCL 50 MG/ML IJ SOLN
25.0000 mg | Freq: Once | INTRAMUSCULAR | Status: AC
  Administered 2019-05-16: 11:00:00 25 mg via INTRAVENOUS

## 2019-05-16 MED ORDER — FAMOTIDINE IN NACL 20-0.9 MG/50ML-% IV SOLN
INTRAVENOUS | Status: AC
  Filled 2019-05-16: qty 50

## 2019-05-16 MED ORDER — SODIUM CHLORIDE 0.9 % IV SOLN
Freq: Once | INTRAVENOUS | Status: AC
  Filled 2019-05-16: qty 5

## 2019-05-16 MED ORDER — FAMOTIDINE IN NACL 20-0.9 MG/50ML-% IV SOLN
20.0000 mg | Freq: Once | INTRAVENOUS | Status: AC
  Administered 2019-05-16: 20 mg via INTRAVENOUS

## 2019-05-16 MED ORDER — PALONOSETRON HCL INJECTION 0.25 MG/5ML
0.2500 mg | Freq: Once | INTRAVENOUS | Status: AC
  Administered 2019-05-16: 11:00:00 0.25 mg via INTRAVENOUS

## 2019-05-16 MED ORDER — PANTOPRAZOLE SODIUM 40 MG PO TBEC: 40.0000 mg | DELAYED_RELEASE_TABLET | Freq: Every day | ORAL | 3 refills | Status: DC

## 2019-05-16 MED ORDER — SODIUM CHLORIDE 0.9 % IV SOLN
87.5000 mg/m2 | Freq: Once | INTRAVENOUS | Status: AC
  Administered 2019-05-16: 13:00:00 126 mg via INTRAVENOUS
  Filled 2019-05-16: qty 21

## 2019-05-16 MED ORDER — DIPHENHYDRAMINE HCL 50 MG/ML IJ SOLN
INTRAMUSCULAR | Status: AC
  Filled 2019-05-16: qty 1

## 2019-05-16 MED ORDER — SODIUM CHLORIDE 0.9 % IV SOLN
380.0000 mg | Freq: Once | INTRAVENOUS | Status: AC
  Administered 2019-05-16: 16:00:00 380 mg via INTRAVENOUS
  Filled 2019-05-16: qty 38

## 2019-05-16 MED ORDER — SODIUM CHLORIDE 0.9% FLUSH
10.0000 mL | Freq: Once | INTRAVENOUS | Status: DC
  Filled 2019-05-16: qty 10

## 2019-05-16 MED ORDER — PALONOSETRON HCL INJECTION 0.25 MG/5ML
INTRAVENOUS | Status: AC
  Filled 2019-05-16: qty 5

## 2019-05-16 MED ORDER — SODIUM CHLORIDE 0.9% FLUSH
10.0000 mL | INTRAVENOUS | Status: DC | PRN
  Administered 2019-05-16: 17:00:00 10 mL
  Filled 2019-05-16: qty 10

## 2019-05-16 NOTE — Patient Instructions (Signed)
[facility] Discharge Instructions for Patients Receiving Chemotherapy  Today you received the following chemotherapy agents: Paclitaxel, Carboplatin  To help prevent nausea and vomiting after your treatment, we encourage you to take your nausea medication as directed by your MD.   If you develop nausea and vomiting that is not controlled by your nausea medication, call the clinic.   BELOW ARE SYMPTOMS THAT SHOULD BE REPORTED IMMEDIATELY:  *FEVER GREATER THAN 100.5 F  *CHILLS WITH OR WITHOUT FEVER  NAUSEA AND VOMITING THAT IS NOT CONTROLLED WITH YOUR NAUSEA MEDICATION  *UNUSUAL SHORTNESS OF BREATH  *UNUSUAL BRUISING OR BLEEDING  TENDERNESS IN MOUTH AND THROAT WITH OR WITHOUT PRESENCE OF ULCERS  *URINARY PROBLEMS  *BOWEL PROBLEMS  UNUSUAL RASH Items with * indicate a potential emergency and should be followed up as soon as possible.  Feel free to call the clinic should you have any questions or concerns. The clinic phone number is [phone].  Please show the CHEMO ALERT CARD at check-in to the Emergency Department and triage nurse.  Coronavirus (COVID-19) Are you at risk?  Are you at risk for the Coronavirus (COVID-19)?  To be considered HIGH RISK for Coronavirus (COVID-19), you have to meet the following criteria:  . Traveled to [place], [place], [name], [place] or [place]; or in the [place] to [place], [place], [place], or [place]; and have fever, cough, and shortness of breath within the last 2 weeks of travel OR . Been in close contact with a person diagnosed with COVID-19 within the last 2 weeks and have fever, cough, and shortness of breath . IF YOU DO NOT MEET THESE CRITERIA, YOU ARE CONSIDERED LOW RISK FOR COVID-19.  What to do if you are HIGH RISK for COVID-19?  . If you are having a medical emergency, call 911. . Seek medical care right away. Before you go to a doctor's office, urgent care or emergency department, call ahead and  tell them about your recent travel, contact with someone diagnosed with COVID-19, and your symptoms. You should receive instructions from your physician's office regarding next steps of care.  . When you arrive at healthcare provider, tell the healthcare staff immediately you have returned from visiting [place], [place], [place], [place] or [name]; or traveled in the [place] to [place], [place], [place], or [place]; in the last two weeks or you have been in close contact with a person diagnosed with COVID-19 in the last 2 weeks.   . Tell the health care staff about your symptoms: fever, cough and shortness of breath. . After you have been seen by a medical provider, you will be either: o Tested for (COVID-19) and discharged home on quarantine except to seek medical care if symptoms worsen, and asked to  - Stay home and avoid contact with others until you get your results (4-5 days)  - Avoid travel on public transportation if possible (such as bus, train, or airplane) or o Sent to the Emergency Department by EMS for evaluation, COVID-19 testing, and possible admission depending on your condition and test results.  What to do if you are LOW RISK for COVID-19?  Reduce your risk of any infection by using the same precautions used for avoiding the common cold or flu:  . Wash your hands often with soap and warm water for at least 20 seconds.  If soap and water are not readily available, use an alcohol-based hand sanitizer with at least 60% alcohol.  .   If coughing or sneezing, cover your mouth and nose by coughing or sneezing into the elbow areas of your shirt or coat, into a tissue or into your sleeve (not your hands). . Avoid shaking hands with others and consider head nods or verbal greetings only. . Avoid touching your eyes, nose, or mouth with unwashed hands.  . Avoid close contact with people who are sick. . Avoid places or events with large numbers of people in one location, like  concerts or sporting events. . Carefully consider travel plans you have or are making. . If you are planning any travel outside or inside the [place], visit the CDC's Travelers' Health webpage for the latest health notices. . If you have some symptoms but not all symptoms, continue to monitor at home and seek medical attention if your symptoms worsen. . If you are having a medical emergency, call 911.   ADDITIONAL HEALTHCARE OPTIONS FOR PATIENTS  [place] Telehealth / e-Visit: [email or web address]         [facility]: [phone]  [facility]: [phone]                   [facility]: [phone]  

## 2019-05-16 NOTE — Telephone Encounter (Signed)
Scheduled appt per 3/1 sch message - pt to get an updated schedule after tx

## 2019-05-16 NOTE — Telephone Encounter (Signed)
Revealed negative genetic testing.  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  We discussed that we do not know why she has cancer or why there is cancer in the family. It could be due to a different gene that we are not testing, or something our current technology cannot pick up.  It will be important for her to keep in contact with genetics to learn if additional testing may be needed in the future.

## 2019-05-16 NOTE — Progress Notes (Signed)
HPI:  [name] [name] was previously seen in the [place] clinic due to a personal and family history of cancer and concerns regarding a hereditary predisposition to cancer. Please refer to our prior cancer genetics clinic note for more information regarding our discussion, assessment and recommendations, at the time. [name] [name] recent genetic test results were disclosed to her, as were recommendations warranted by these results. These results and recommendations are discussed in more detail below.  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 Findings highly suspicious for peritoneal carcinomatosis. Recommend paracentesis for therapeutic and diagnostic purposes. I do not see an obvious primary lesion but there is some irregular enhancing soft tissue in the right adnexal area. CA 125 level may be helpful. 2. Mild surface irregularity involving the liver but I do not see any obvious changes of cirrhosis. No hepatic lesions.   11/23/2018 Tumor Marker   Patient's tumor was tested for the following markers: CA-125 Results of the tumor marker test revealed 253.   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   12/01/2018 Cancer Staging   Staging form: Ovary, Fallopian Tube, and Primary Peritoneal Carcinoma, AJCC 8th Edition - Clinical stage from 12/01/2018: cT3, cN0, cM0 - Signed by [name], MD on 12/01/2018   12/02/2018 Tumor Marker   Patient's tumor was tested for the following markers: CA-125 Results of the tumor marker test revealed 267   12/06/2018 -  Chemotherapy   The patient had carboplatin and taxol for chemotherapy treatment.     12/08/2018 Procedure   Successful ultrasound-guided therapeutic paracentesis yielding 1.7 liters of peritoneal fluid.   12/24/2018 Procedure   Successful placement of a right IJ approach Power Port with ultrasound and fluoroscopic guidance. The catheter is ready for use   01/17/2019 Tumor Marker   Patient's tumor was tested for the following markers: CA-125 Results of the tumor marker test revealed 57.2   01/31/2019 Imaging   Significant decrease in peritoneal carcinomatosis since previous study. Interval resolution of ascites.   Focal area of parenchymal consolidation in central right middle lobe, which measures 3 cm. Differential diagnosis includes infectious or inflammatory process, atelectasis, and neoplasm. Recommend short-term follow-up by chest CT in 2-3 months.     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   Surgeon: Donaciano Eva    Operative Findings:  : grossly normal uterus, ovaries normal, few scattered peritoneal nodules (63m) on serosa of uterus and tubes. The omentum (gastrocolic) was tethered to the mesentery with tumor (thin rind). Complete (optimal) resection of tumor with no gross residual disease.     02/17/2019 Pathology Results   FINAL MICROSCOPIC DIAGNOSIS: A. UTERUS, CERVIX, BILATERAL FALLOPIAN TUBES AND OVARIES, HYSTERECTOMY WITH SALPINGOOOPHORECTOMY: - Uterus: Endometrium: Inactive endometrium. No hyperplasia or malignancy. Myometrium: Unremarkable. No malignancy. Serosa: Metastatic carcinoma. No malignancy. - Cervix: Benign squamous and endocervical mucosa. No dysplasia or malignancy. - Left ovary and fallopian tube: Metastatic carcinoma. - Right ovary: No malignancy identified. - Right fallopian tube: Luminal tumor, see comment. B. OMENTUM, RESECTION: - High grade serous carcinoma. - Deposits up to at least 3 cm. - See oncology table. ONCOLOGY  TABLE: OVARY or FALLOPIAN TUBE  or PRIMARY PERITONEUM: Procedure: Hysterectomy with bilateral salpingo-oophorectomy and omentectomy. Specimen Integrity: Intact Tumor Site: Peritoneum Ovarian Surface Involvement (required only if applicable): Left ovary Fallopian Tube Surface Involvement (required only if applicable): Left Fallopian tube Tumor Size: Largest deposit 3 cm Histologic Type: High-grade serous carcinoma Histologic Grade: High-grade Implants (required for advanced stage serous/seromucinous borderline tumors only): Uterine serosa, left fallopian tube and ovary, omentum Other Tissue/ Organ Involvement: As above Largest Extrapelvic Peritoneal Focus (required only if applicable): 3 cm Peritoneal/Ascitic Fluid: Pre neoadjuvant [id number] positive for carcinoma. Treatment Effect (required only for high-grade serous carcinomas): Probably partial treatment effect in omental tissue. Regional Lymph Nodes: No lymph nodes submitted or found Pathologic Stage Classification (pTNM, AJCC 8th Edition): ypT3c, ypNX Representative Tumor Block: B5 Comment(s): There is surface involvement of the left ovary and fallopian tube but no primary tumor. Within the right fallopian tube lumen there is detached fragments of tumor but again no precursor lesion is noted in the right fallopian tube. Thus, the tumor is presumed primary peritoneal and staged as such.   03/21/2019 Tumor Marker   Patient's tumor was tested for the following markers: CA-125 Results of the tumor marker test revealed 15.1    Genetic Testing   Negative genetic testing. No pathogenic variants identified on the [facility] CancerNext + RNAinsight panel. VUS in ATM called c.[id number] identified. The report date is 05/12/2019. TumorNext HRD was originally ordered but there was not enough sample to complete this testing.   The CancerNext+RNAinsight gene panel offered by [facility] includes sequencing and rearrangement analysis for the following 36  genes: APC*, ATM*, AXIN2, BARD1, BMPR1A, BRCA1*, BRCA2*, BRIP1*, CDH1*, CDK4, CDKN2A, CHEK2*, DICER1, MLH1*, MSH2*, MSH3, MSH6*, MUTYH*, NBN, NF1*, NTHL1, PALB2*, PMS2*, PTEN*, RAD51C*, RAD51D*, RECQL, SMAD4, SMARCA4, STK11 and TP53* (sequencing and deletion/duplication); HOXB13, POLD1 and POLE (sequencing only); EPCAM and GREM1 (deletion/duplication only). DNA and RNA analyses performed for * genes.      FAMILY HISTORY:  We obtained a detailed, 4-generation family history.  Significant diagnoses are listed below: Family History  Problem Relation Age of Onset   Heart disease Mother    Depression Mother    Hypertension Mother    Heart disease Father    Hypertension Father    Aneurysm Father    Uterine cancer Sister 20   Hypertension Sister    Ovarian cancer Sister 53   Melanoma Sister 20   Depression Sister    Melanoma Sister 29   Heart disease Paternal Grandfather    Stroke Maternal Grandmother    Cancer Maternal Grandfather 56       Pancreatic   Stroke Paternal Grandmother    Cancer Paternal Uncle        multiple myeloma, prostate cancer and melanoma (separate primaries)   Colon cancer Neg Hx     [name] [name] does not have children. She has 3 sisters. One sister had ovarian cancer at 82 and vulvar melanoma at 43, and died at 45. Another sister is living at 7 and melanoma at 36, and her third sister is living at 33 and had endometrial cancer at 54. Patient has 3 nephews and 1 niece.   [name] [name] mother died at 61, no history of cancer. Patient had 1 maternal aunt, no cancer, died in her 54s due to multiple sclerosis. Patient does not have contact with her 2 cousins on this side of the family. Maternal grandfather had pancreatic cancer at 31 and died at 45. Grandmother had stroke and passed in her 30s-70s. This grandmother's  father had colon cancer and died at 18.   [name] [name] father died at 19, no cancer. Patient had 1 paternal aunt and 1 paternal uncle.  Her uncle had 3 types of cancer: multiple myeloma, prostate cancer and melanoma. No known cancers in paternal cousins. Paternal grandparents both passed approximately in their 39s, with no cancer history.  [name] [name] is unaware of previous family history of genetic testing for hereditary cancer risks.There is no reported Ashkenazi Jewish ancestry. There is no known consanguinity.  GENETIC TEST RESULTS: Genetic testing reported out on 05/12/2019 through the [facility] CancerNext+RNAinsight cancer panel found no pathogenic mutations.  The CancerNext+RNAinsight gene panel offered by [facility] includes sequencing and rearrangement analysis for the following 36 genes: APC*, ATM*, AXIN2, BARD1, BMPR1A, BRCA1*, BRCA2*, BRIP1*, CDH1*, CDK4, CDKN2A, CHEK2*, DICER1, MLH1*, MSH2*, MSH3, MSH6*, MUTYH*, NBN, NF1*, NTHL1, PALB2*, PMS2*, PTEN*, RAD51C*, RAD51D*, RECQL, SMAD4, SMARCA4, STK11 and TP53* (sequencing and deletion/duplication); HOXB13, POLD1 and POLE (sequencing only); EPCAM and GREM1 (deletion/duplication only). DNA and RNA analyses performed for * genes.   TumorNext HRD was originally ordered but there was insufficient tumor sample and the test could not be completed.  The test report has been scanned into EPIC and is located under the Molecular Pathology section of the Results Review tab.  A portion of the result report is included below for reference.     We discussed with [name] [name] that because current genetic testing is not perfect, it is possible there may be a gene mutation in one of these genes that current testing cannot detect, but that chance is small.  We also discussed, that there could be another gene that has not yet been discovered, or that we have not yet tested, that is responsible for the cancer diagnoses in the family. It is also possible there is a hereditary cause for the cancer in the family that [name] [name] did not inherit and therefore was not identified in her testing.   Therefore, it is important to remain in touch with cancer genetics in the future so that we can continue to offer [name] [name] the most up to date genetic testing.   Genetic testing did identify a variant of uncertain significance (VUS) was identified in the ATM gene called c.6007G>A.  At this time, it is unknown if this variant is associated with increased cancer risk or if this is a normal finding, but most variants such as this get reclassified to being inconsequential. It should not be used to make medical management decisions. With time, we suspect the lab will determine the significance of this variant, if any. If we do learn more about it, we will try to contact [name] [name] to discuss it further. However, it is important to stay in touch with [name] periodically and keep the address and phone number up to date.  ADDITIONAL GENETIC TESTING: We discussed with [name] [name] that her genetic testing was fairly extensive.  If there are genes identified to increase cancer risk that can be analyzed in the future, we would be happy to discuss and coordinate this testing at that time.    CANCER SCREENING RECOMMENDATIONS: [name] [name] test result is considered negative (normal).  This means that we have not identified a hereditary cause for her  personal and family history of cancer at this time.  While reassuring, this does not definitively rule out a hereditary predisposition to cancer. It is still possible that there could be genetic mutations that are undetectable by current technology. There  could be genetic mutations in genes that have not been tested or identified to increase cancer risk.  Therefore, it is recommended she continue to follow the cancer management and screening guidelines provided by her oncology and primary healthcare provider.   An individual's cancer risk and medical management are not determined by genetic test results alone. Overall cancer risk assessment incorporates additional  factors, including personal medical history, family history, and any available genetic information that may result in a personalized plan for cancer prevention and surveillance.  RECOMMENDATIONS FOR FAMILY MEMBERS:  Relatives in this family might be at some increased risk of developing cancer, over the general population risk, simply due to the family history of cancer.  We recommended female relatives in this family have a yearly mammogram beginning at age 63, or 78 years younger than the earliest onset of cancer, an annual clinical breast exam, and perform monthly breast self-exams. Female relatives in this family should also have a gynecological exam as recommended by their primary provider. All family members should have a colonoscopy by age 47, or as directed by their physicians.   It is also possible there is a hereditary cause for the cancer in [name] [name]'s family that she did not inherit and therefore was not identified in her.  Based on [name] [name]'s family history, we recommended her sisters and maternal relatives have genetic counseling and testing. [name] [name] will let us know if we can be of any assistance in coordinating genetic counseling and/or testing for these family members.  FOLLOW-UP: Lastly, we discussed with [name] [name] that cancer genetics is a rapidly advancing field and it is possible that new genetic tests will be appropriate for her and/or her family members in the future. We encouraged her to remain in contact with cancer genetics on an annual basis so we can update her personal and family histories and let her know of advances in cancer genetics that may benefit this family.   Our contact number was provided. [name] [name] questions were answered to her satisfaction, and she knows she is welcome to call us at anytime with additional questions or concerns.   Faith Rogue, MS, [facility] Genetic Counselor [email or web address] Phone: [phone]

## 2019-05-16 NOTE — Progress Notes (Signed)
Per Dr. Gorsuch ok to treat with elevated HR °

## 2019-05-17 ENCOUNTER — Encounter: Payer: Self-pay | Admitting: Hematology and Oncology

## 2019-05-17 ENCOUNTER — Telehealth: Payer: Self-pay

## 2019-05-17 DIAGNOSIS — R000 Tachycardia, unspecified: Secondary | ICD-10-CM | POA: Insufficient documentation

## 2019-05-17 DIAGNOSIS — L719 Rosacea, unspecified: Secondary | ICD-10-CM | POA: Insufficient documentation

## 2019-05-17 LAB — CA 125: Cancer Antigen (CA) 125: 10.7 U/mL (ref 0.0–38.1)

## 2019-05-17 NOTE — Assessment & Plan Note (Signed)
Likely due to corticosteroid therapy I anticipate will improve once we stop treatment

## 2019-05-17 NOTE — Assessment & Plan Note (Signed)
The anemia is stable. Observe for now

## 2019-05-17 NOTE — Assessment & Plan Note (Signed)
I plan to reduce the dose of chemotherapy further and I explained to her it will not jeopardize the effectiveness of treatment

## 2019-05-17 NOTE — Telephone Encounter (Signed)
She called and left a message to call her.  Called back. She is wanting Dr. Alvy Bimler know that she scheduled the scan for 1130 on 4/1. Offered to move lab and flush appt closer to scan. She declined offer and would like leave the times as scheduled.  Just FYI.

## 2019-05-17 NOTE — Assessment & Plan Note (Signed)
Likely due to side effects of chemotherapy, statistically corticosteroid therapy I anticipate it will improve once we stop treatment

## 2019-05-17 NOTE — Assessment & Plan Note (Signed)
She has questionable lung lesion on her last CT imaging I plan to order CT chest next month for further follow-up

## 2019-05-17 NOTE — Assessment & Plan Note (Signed)
She has significant side effects of symptoms which I believe could be due to esophagitis causing dysphagia I recommend she hold off her residual neck for month and start on high-dose proton pump inhibitor Alternatively, we can also recommend evaluation by gastroenterologist but she is comfortable to proceed with proton pump inhibitor for now

## 2019-05-17 NOTE — Progress Notes (Signed)
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 I plan further dose adjustment of paclitaxel and reduce steroids Her tumor marker is within normal limits  Peripheral neuropathy due to chemotherapy (HCC) I plan to reduce the dose of chemotherapy further and I explained to her it will not jeopardize the effectiveness of treatment  Lesion of lung She has questionable lung lesion on her last CT imaging I plan to order CT chest next month for further follow-up  GERD (gastroesophageal reflux disease) She has significant side effects of symptoms which I believe could be due to esophagitis causing dysphagia I recommend she hold off her residual neck for month and start on high-dose proton pump inhibitor Alternatively, we can also recommend evaluation by gastroenterologist but she is comfortable to proceed with proton pump inhibitor for now  Anemia due to antineoplastic chemotherapy The anemia is stable. Observe for now  Tachycardia Likely due to side effects of chemotherapy, statistically corticosteroid therapy I anticipate it will improve once we stop treatment  Rosacea Likely due to corticosteroid therapy I anticipate will improve once we stop treatment   Orders Placed This Encounter  Procedures  . CT CHEST W CONTRAST    Standing Status:   Future    Standing Expiration Date:   05/15/2020    Order Specific Question:    If indicated for the ordered procedure, I authorize the administration of contrast media per Radiology protocol    Answer:   Yes    Order Specific Question:   Preferred imaging location?    Answer:   [facility]    Order Specific Question:   Radiology Contrast Protocol - do NOT remove file path    Answer:   \\[email or web address]  . CT ABDOMEN PELVIS W CONTRAST    Standing Status:   Future    Standing Expiration Date:   05/15/2020    Order Specific Question:   If indicated for the ordered procedure, I authorize the administration of contrast media per Radiology protocol    Answer:   Yes    Order Specific Question:   Preferred imaging location?    Answer:   [facility]    Order Specific Question:   Radiology Contrast Protocol - do NOT remove file path    Answer:   \\[email or web address]    All questions were answered. The patient knows to call the clinic with any problems, questions or concerns. The total time spent in the appointment was 40 minutes encounter with patients including review of chart and various tests results, discussions about plan of care and coordination of care plan   [name], MD 05/17/2019 9:06 AM  INTERVAL HISTORY: Please see below for problem oriented charting. She returns for further follow-up She brought with her a list of concerns Since her last cycle of treatment, she had experience significant side effects and she felt it is the worst chemo therapy experience she had She had numbness in her tongue and  lips and was told at the back of the half of the palate.  It has affected her swallowing She had history of swallowing difficulties after 3 neck surgeries and she stated in her statement that she cannot afford to have additional dysphagia Of note, on reviewing her medications, she is still taking risedronate She has occasional symptoms of fullness in her esophagus as if she could not eat and drink She  also have abdominal gurgling and discomfort for several days and also some reflux type regurgitation She felt bad for the first 5 days that she have to spend a lot of these days lying down She have some hollow twinges in the area between her clavicles and extreme dryness in her mouth She also have fatigue, rosacea for about 3 days, overall feeling bad and pain in the shoulders, collarbone and the neck Her toe numbness remain but it did not get worse She also noted she have highs pulse rate especially in the morning and wondering whether this could be related to dexamethasone She is wondering about the plan for CT imaging and what to do beyond that She has occasional mild sensation along her incision site She denies nausea or constipation  SUMMARY OF ONCOLOGIC HISTORY: Oncology History Overview Note  Negative genetics High grade serous   Peritoneal carcinoma ([place])  11/23/2018 Imaging   Ct abdomen and pelvis 1. Findings highly suspicious for peritoneal carcinomatosis. Recommend paracentesis for therapeutic and diagnostic purposes. I do not see an obvious primary lesion but there is some irregular enhancing soft tissue in the right adnexal area. CA 125 level may be helpful. 2. Mild surface irregularity involving the liver but I do not see any obvious changes of cirrhosis. No hepatic lesions.   11/23/2018 Tumor Marker   Patient's tumor was tested for the following markers: CA-125 Results of the tumor marker test revealed 253.   11/25/2018 Initial Diagnosis   Peritoneal carcinoma ([place])   11/25/2018 Imaging   US paracentesis Successful ultrasound-guided paracentesis yielding 3.4 L of peritoneal fluid.   12/01/2018 Cancer Staging   Staging form: Ovary, Fallopian Tube, and Primary Peritoneal Carcinoma, AJCC 8th Edition - Clinical stage from 12/01/2018: cT3, cN0, cM0 - Signed by [name], MD on 12/01/2018   12/02/2018 Tumor Marker   Patient's tumor was tested for the following markers:  CA-125 Results of the tumor marker test revealed 267   12/06/2018 -  Chemotherapy   The patient had carboplatin and taxol for chemotherapy treatment.     12/08/2018 Procedure   Successful ultrasound-guided therapeutic paracentesis yielding 1.7 liters of peritoneal fluid.   12/24/2018 Procedure   Successful placement of a right IJ approach Power Port with ultrasound and fluoroscopic guidance. The catheter is ready for use   01/17/2019 Tumor Marker   Patient's tumor was tested for the following markers: CA-125 Results of the tumor marker test revealed 57.2   01/31/2019 Imaging   Significant decrease in peritoneal carcinomatosis since previous study. Interval resolution of ascites.   Focal area of parenchymal consolidation in central right middle lobe, which measures 3 cm. Differential diagnosis includes infectious or inflammatory process, atelectasis, and neoplasm. Recommend short-term follow-up by chest CT in 2-3 months.     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   Surgeon: Donaciano Eva    Operative Findings:  : grossly  normal uterus, ovaries normal, few scattered peritoneal nodules (29m) on serosa of uterus and tubes. The omentum (gastrocolic) was tethered to the mesentery with tumor (thin rind). Complete (optimal) resection of tumor with no gross residual disease.     02/17/2019 Pathology Results   FINAL MICROSCOPIC DIAGNOSIS: A. UTERUS, CERVIX, BILATERAL FALLOPIAN TUBES AND OVARIES, HYSTERECTOMY WITH SALPINGOOOPHORECTOMY: - Uterus: Endometrium: Inactive endometrium. No hyperplasia or malignancy. Myometrium: Unremarkable. No malignancy. Serosa: Metastatic carcinoma. No malignancy. - Cervix: Benign squamous and endocervical  mucosa. No dysplasia or malignancy. - Left ovary and fallopian tube: Metastatic carcinoma. - Right ovary: No malignancy identified. - Right fallopian tube: Luminal tumor, see comment. B. OMENTUM, RESECTION: - High grade serous carcinoma. - Deposits up to at least 3 cm. - See oncology table. ONCOLOGY TABLE: OVARY or FALLOPIAN TUBE or PRIMARY PERITONEUM: Procedure: Hysterectomy with bilateral salpingo-oophorectomy and omentectomy. Specimen Integrity: Intact Tumor Site: Peritoneum Ovarian Surface Involvement (required only if applicable): Left ovary Fallopian Tube Surface Involvement (required only if applicable): Left Fallopian tube Tumor Size: Largest deposit 3 cm Histologic Type: High-grade serous carcinoma Histologic Grade: High-grade Implants (required for advanced stage serous/seromucinous borderline tumors only): Uterine serosa, left fallopian tube and ovary, omentum Other Tissue/ Organ Involvement: As above Largest Extrapelvic Peritoneal Focus (required only if applicable): 3 cm Peritoneal/Ascitic Fluid: Pre neoadjuvant [id number] positive for carcinoma. Treatment Effect (required only for high-grade serous carcinomas): Probably partial treatment effect in omental tissue. Regional Lymph Nodes: No lymph nodes submitted or found Pathologic Stage Classification (pTNM, AJCC 8th Edition): ypT3c, ypNX Representative Tumor Block: B5 Comment(s): There is surface involvement of the left ovary and fallopian tube but no primary tumor. Within the right fallopian tube lumen there is detached fragments of tumor but again no precursor lesion is noted in the right fallopian tube. Thus, the tumor is presumed primary peritoneal and staged as such.   03/21/2019 Tumor Marker   Patient's tumor was tested for the following markers: CA-125 Results of the tumor marker test revealed 15.1    Genetic Testing   Negative genetic testing. No pathogenic variants identified on the [facility] CancerNext + RNAinsight  panel. VUS in ATM called [id number] identified. The report date is 05/12/2019. TumorNext HRD was originally ordered but there was not enough sample to complete this testing.   The CancerNext+RNAinsight gene panel offered by [employer] sequencing and rearrangement analysis for the following 36 genes: APC*, ATM*, AXIN2, BARD1, BMPR1A, BRCA1*, BRCA2*, BRIP1*, CDH1*, CDK4, CDKN2A, CHEK2*, DICER1, MLH1*, MSH2*, MSH3, MSH6*, MUTYH*, NBN, NF1*, NTHL1, PALB2*, PMS2*, PTEN*, RAD51C*, RAD51D*, RECQL, SMAD4, SMARCA4, STK11 and TP53* (sequencing and deletion/duplication); HOXB13, POLD1 and POLE (sequencing only); EPCAM and GREM1 (deletion/duplication only). DNA and RNA analyses performed for * genes.    05/16/2019 Tumor Marker   Patient's tumor was tested for the following markers: CA-125 Results of the tumor marker test revealed 10.7     REVIEW OF SYSTEMS:   Constitutional: Denies fevers, chills or abnormal weight loss Eyes: Denies blurriness of vision Ears, nose, mouth, throat, and face: Denies mucositis or sore throat Respiratory: Denies cough, dyspnea or wheezes Cardiovascular: Denies palpitation, chest discomfort or lower extremity swelling Skin: Denies abnormal skin rashes Lymphatics: Denies new lymphadenopathy or easy bruising Behavioral/Psych: Mood is stable, no new changes  All other systems were reviewed with the patient and are negative.  I have reviewed the past medical history, past surgical history, social history and family history with the patient and they are unchanged from previous note.  ALLERGIES:  is allergic to fentanyl;  milk-related compounds; oxybutynin chloride; thimerosal; tramadol; avelox [moxifloxacin hcl in nacl]; doxycycline; erythromycin; metronidazole; minocycline; penicillins; quinolones; and sulfonamide derivatives.  MEDICATIONS:  Current Outpatient Medications  Medication Sig Dispense Refill  . acetaminophen (TYLENOL) 500 MG tablet Take 500 mg by mouth  every 6 (six) hours as needed for moderate pain or headache.    . albuterol (VENTOLIN HFA) 108 (90 Base) MCG/ACT inhaler Inhale 2 puffs into the lungs every 6 (six) hours as needed for wheezing or shortness of breath. 18 g 1  . ARTIFICIAL TEAR SOLUTION OP Place 1 drop into both eyes daily as needed (dry eyes).    . Azelaic Acid (FINACEA) 15 % cream Apply 1 application topically daily.    . cromolyn (NASALCROM) 5.2 MG/ACT nasal spray Place 1 spray into the nose as needed for allergies.     Marland Kitchen gabapentin (NEURONTIN) 300 MG capsule Take 600 mg by mouth 3 (three) times daily.     Marland Kitchen guaiFENesin (MUCINEX) 600 MG 12 hr tablet Take 600 mg by mouth 2 (two) times daily as needed (congestion).    . Ketotifen Fumarate (ALLERGY EYE DROPS OP) Place 1 drop into both eyes daily as needed (allergies).    Marland Kitchen lidocaine-prilocaine (EMLA) cream Apply to affected area once (Patient taking differently: Apply 1 application topically daily as needed (port access). ) 30 g 3  . loratadine (CLARITIN) 5 MG/5ML syrup Take 5 mg by mouth daily as needed for allergies (bone pain related to chemo).    . mupirocin ointment (BACTROBAN) 2 % Apply 1 application topically as needed. (Patient taking differently: Apply 1 application topically as needed (wound care). ) 22 g 2  . ondansetron (ZOFRAN) 8 MG tablet Take 1 tablet (8 mg total) by mouth every 8 (eight) hours as needed for refractory nausea / vomiting. 30 tablet 1  . pantoprazole (PROTONIX) 40 MG tablet Take 1 tablet (40 mg total) by mouth daily. 30 tablet 3  . prochlorperazine (COMPAZINE) 10 MG tablet Take 1 tablet (10 mg total) by mouth every 6 (six) hours as needed (Nausea or vomiting). 30 tablet 1  . Risedronate Sodium 35 MG TBEC Take 1 tablet (35 mg total) by mouth once a week. (Patient taking differently: Take 1 tablet by mouth every Tuesday. ) 4 tablet 11  . Sulfacetamide Sodium, Acne, 10 % LOTN Apply 1 application topically at bedtime.     No current facility-administered  medications for this visit.    PHYSICAL EXAMINATION: ECOG PERFORMANCE STATUS: 1 - Symptomatic but completely ambulatory  Vitals:   05/16/19 1027  BP: 136/66  Pulse: (!) 125  Resp: 18  Temp: 98.9 F (37.2 C)  SpO2: 98%   Filed Weights   05/16/19 1027  Weight: 102 lb 9.6 oz (46.5 kg)    GENERAL:alert, no distress and comfortable.  She looks thin SKIN: skin color, texture, turgor are normal, no rashes or significant lesions EYES: normal, Conjunctiva are pink and non-injected, sclera clear OROPHARYNX:no exudate, no erythema and lips, buccal mucosa, and tongue normal  NECK: supple, noted well-healed surgical scar LYMPH:  no palpable lymphadenopathy in the cervical, axillary or inguinal LUNGS: clear to auscultation and percussion with normal breathing effort HEART: Tachycardia, no murmurs and no lower extremity edema ABDOMEN:abdomen soft, non-tender and normal bowel sounds.  The incision scar looks well-healed Musculoskeletal:no cyanosis of digits and no clubbing  NEURO: alert & oriented x 3 with fluent speech, no focal motor/sensory deficits  LABORATORY DATA:  I have reviewed the data as listed  Component Value Date/Time   NA 140 05/16/2019 1012   K 3.9 05/16/2019 1012   CL 102 05/16/2019 1012   CO2 26 05/16/2019 1012   GLUCOSE 125 (H) 05/16/2019 1012   BUN 11 05/16/2019 1012   CREATININE 0.65 05/16/2019 1012   CALCIUM 10.1 05/16/2019 1012   PROT 8.3 (H) 05/16/2019 1012   ALBUMIN 4.5 05/16/2019 1012   AST 19 05/16/2019 1012   ALT 16 05/16/2019 1012   ALKPHOS 77 05/16/2019 1012   BILITOT 0.2 (L) 05/16/2019 1012   GFRNONAA >60 05/16/2019 1012   GFRAA >60 05/16/2019 1012    No results found for: SPEP, UPEP  Lab Results  Component Value Date   WBC 12.0 (H) 05/16/2019   NEUTROABS 11.1 (H) 05/16/2019   HGB 11.3 (L) 05/16/2019   HCT 34.3 (L) 05/16/2019   MCV 87.1 05/16/2019   PLT 323 05/16/2019      Chemistry      Component Value Date/Time   NA 140  05/16/2019 1012   K 3.9 05/16/2019 1012   CL 102 05/16/2019 1012   CO2 26 05/16/2019 1012   BUN 11 05/16/2019 1012   CREATININE 0.65 05/16/2019 1012      Component Value Date/Time   CALCIUM 10.1 05/16/2019 1012   ALKPHOS 77 05/16/2019 1012   AST 19 05/16/2019 1012   ALT 16 05/16/2019 1012   BILITOT 0.2 (L) 05/16/2019 1012

## 2019-05-17 NOTE — Assessment & Plan Note (Signed)
Per patient request, I have asked genetic counselor to reach out to her Genetic testing came back negative She will proceed to finish cycle 6 of treatment with plan to repeat CT imaging study next month for objective assessment of response to therapy Clinically, she is not symptomatic but is experiencing multiple side effects of therapy. I plan further dose adjustment of paclitaxel and reduce steroids Her tumor marker is within normal limits

## 2019-06-08 ENCOUNTER — Other Ambulatory Visit: Payer: Self-pay | Admitting: Hematology and Oncology

## 2019-06-16 ENCOUNTER — Inpatient Hospital Stay: Payer: Medicare Other | Attending: Gynecologic Oncology

## 2019-06-16 ENCOUNTER — Ambulatory Visit (HOSPITAL_COMMUNITY)
Admission: RE | Admit: 2019-06-16 | Discharge: 2019-06-16 | Disposition: A | Discharge status: Home / Self Care | Payer: Medicare Other | Source: Ambulatory Visit | Attending: Hematology and Oncology | Admitting: Hematology and Oncology

## 2019-06-16 ENCOUNTER — Inpatient Hospital Stay: Payer: Medicare Other

## 2019-06-16 ENCOUNTER — Other Ambulatory Visit: Payer: Self-pay

## 2019-06-16 ENCOUNTER — Encounter (HOSPITAL_COMMUNITY): Payer: Self-pay

## 2019-06-16 DIAGNOSIS — R911 Solitary pulmonary nodule: Secondary | ICD-10-CM

## 2019-06-16 DIAGNOSIS — Z8542 Personal history of malignant neoplasm of other parts of uterus: Secondary | ICD-10-CM | POA: Insufficient documentation

## 2019-06-16 DIAGNOSIS — C482 Malignant neoplasm of peritoneum, unspecified: Secondary | ICD-10-CM

## 2019-06-16 DIAGNOSIS — D6481 Anemia due to antineoplastic chemotherapy: Secondary | ICD-10-CM | POA: Insufficient documentation

## 2019-06-16 DIAGNOSIS — R5381 Other malaise: Secondary | ICD-10-CM | POA: Diagnosis not present

## 2019-06-16 DIAGNOSIS — G620 Drug-induced polyneuropathy: Secondary | ICD-10-CM | POA: Diagnosis not present

## 2019-06-16 LAB — CMP (CANCER CENTER ONLY)
ALT: 16 U/L (ref 0–44)
AST: 23 U/L (ref 15–41)
Albumin: 4.4 g/dL (ref 3.5–5.0)
Alkaline Phosphatase: 64 U/L (ref 38–126)
Anion gap: 12 (ref 5–15)
BUN: 7 mg/dL — ABNORMAL LOW (ref 8–23)
CO2: 26 mmol/L (ref 22–32)
Calcium: 9.6 mg/dL (ref 8.9–10.3)
Chloride: 102 mmol/L (ref 98–111)
Creatinine: 0.66 mg/dL (ref 0.44–1.00)
GFR, Est AFR Am: >60 mL/min (ref >60)
GFR, Estimated: >60 mL/min (ref >60)
Glucose, Bld: 94 mg/dL (ref 70–99)
Potassium: 3.8 mmol/L (ref 3.5–5.1)
Sodium: 140 mmol/L (ref 135–145)
Total Bilirubin: 0.5 mg/dL (ref 0.3–1.2)
Total Protein: 7.8 g/dL (ref 6.5–8.1)

## 2019-06-16 LAB — CBC WITH DIFFERENTIAL (CANCER CENTER ONLY)
Abs Immature Granulocytes: 0.02 10*3/uL (ref 0.00–0.07)
Basophils Absolute: 0 10*3/uL (ref 0.0–0.1)
Basophils Relative: 1 %
Eosinophils Absolute: 0 10*3/uL (ref 0.0–0.5)
Eosinophils Relative: 1 %
HCT: 33.3 % — ABNORMAL LOW (ref 36.0–46.0)
Hemoglobin: 10.8 g/dL — ABNORMAL LOW (ref 12.0–15.0)
Immature Granulocytes: 0 %
Lymphocytes Relative: 34 %
Lymphs Abs: 1.7 10*3/uL (ref 0.7–4.0)
MCH: 28.8 pg (ref 26.0–34.0)
MCHC: 32.4 g/dL (ref 30.0–36.0)
MCV: 88.8 fL (ref 80.0–100.0)
Monocytes Absolute: 0.4 10*3/uL (ref 0.1–1.0)
Monocytes Relative: 8 %
Neutro Abs: 2.7 10*3/uL (ref 1.7–7.7)
Neutrophils Relative %: 56 %
Platelet Count: 233 10*3/uL (ref 150–400)
RBC: 3.75 MIL/uL — ABNORMAL LOW (ref 3.87–5.11)
RDW: 16.2 % — ABNORMAL HIGH (ref 11.5–15.5)
WBC Count: 4.9 10*3/uL (ref 4.0–10.5)
nRBC: 0 % (ref 0.0–0.2)

## 2019-06-16 MED ORDER — HEPARIN SOD (PORK) LOCK FLUSH 100 UNIT/ML IV SOLN
INTRAVENOUS | Status: AC
  Filled 2019-06-16: qty 5

## 2019-06-16 MED ORDER — SODIUM CHLORIDE 0.9% FLUSH
10.0000 mL | Freq: Once | INTRAVENOUS | Status: AC
  Administered 2019-06-16: 10 mL
  Filled 2019-06-16: qty 10

## 2019-06-16 MED ORDER — SODIUM CHLORIDE (PF) 0.9 % IJ SOLN
INTRAMUSCULAR | Status: AC
  Filled 2019-06-16: qty 50

## 2019-06-16 MED ORDER — IOHEXOL 300 MG/ML  SOLN
75.0000 mL | Freq: Once | INTRAMUSCULAR | Status: AC | PRN
  Administered 2019-06-16: 75 mL via INTRAVENOUS

## 2019-06-16 MED ORDER — HEPARIN SOD (PORK) LOCK FLUSH 100 UNIT/ML IV SOLN
500.0000 [IU] | Freq: Once | INTRAVENOUS | Status: AC
  Administered 2019-06-16: 500 [IU] via INTRAVENOUS

## 2019-06-16 NOTE — Patient Instructions (Signed)

## 2019-06-17 ENCOUNTER — Telehealth: Payer: Self-pay | Admitting: Hematology and Oncology

## 2019-06-17 ENCOUNTER — Inpatient Hospital Stay (HOSPITAL_BASED_OUTPATIENT_CLINIC_OR_DEPARTMENT_OTHER): Payer: Medicare Other | Admitting: Hematology and Oncology

## 2019-06-17 ENCOUNTER — Other Ambulatory Visit: Payer: Self-pay

## 2019-06-17 ENCOUNTER — Encounter: Payer: Self-pay | Admitting: Hematology and Oncology

## 2019-06-17 ENCOUNTER — Telehealth: Payer: Self-pay

## 2019-06-17 VITALS — BP 141/63 | HR 82 | Temp 98.7°F | Resp 18 | Ht 65.0 in | Wt 105.9 lb

## 2019-06-17 DIAGNOSIS — R5381 Other malaise: Secondary | ICD-10-CM

## 2019-06-17 DIAGNOSIS — Z8542 Personal history of malignant neoplasm of other parts of uterus: Secondary | ICD-10-CM | POA: Diagnosis not present

## 2019-06-17 DIAGNOSIS — G620 Drug-induced polyneuropathy: Secondary | ICD-10-CM | POA: Diagnosis not present

## 2019-06-17 DIAGNOSIS — R911 Solitary pulmonary nodule: Secondary | ICD-10-CM

## 2019-06-17 DIAGNOSIS — T451X5A Adverse effect of antineoplastic and immunosuppressive drugs, initial encounter: Secondary | ICD-10-CM

## 2019-06-17 DIAGNOSIS — D6481 Anemia due to antineoplastic chemotherapy: Secondary | ICD-10-CM

## 2019-06-17 DIAGNOSIS — C482 Malignant neoplasm of peritoneum, unspecified: Secondary | ICD-10-CM | POA: Diagnosis not present

## 2019-06-17 LAB — CA 125: Cancer Antigen (CA) 125: 9.1 U/mL (ref 0.0–38.1)

## 2019-06-17 NOTE — Progress Notes (Signed)
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[facility]) I have reviewed blood work and CT imaging with the patient and her husband There are no signs of disease Her tumor marker is normal She had lung lesion for many years and that is stable We discussed the risk of disease relapse She would like to keep her port for now I will schedule port flush every 6 weeks and blood work along with tumor marker every 12 weeks She will see GYN surgeon in 3 months and I will see her back in 6 months for further follow-up There is no contraindication for her to receive Covid vaccination We discussed long-term follow-up and prognosis She can resume primary care visit in the near future She can resume screening mammogram in the future  Anemia due to antineoplastic chemotherapy Her blood count is stable She is not symptomatic Observe only I anticipate her blood count will return back to normal in a few months  Peripheral neuropathy due to chemotherapy [facility]) She denies worsening neuropathy She will continue taking gabapentin  Lesion of lung She had a lesion in her lung for many years We will observe only  Physical debility She is debilitated since chemotherapy and surgery She has some pelvic floor dysfunction We discussed the role of physical therapy referral and she agreed to proceed   Orders Placed This Encounter  Procedures  . Ambulatory referral to Physical Therapy    Referral Priority:   Routine    Referral Type:   Physical Medicine    Referral Reason:   Specialty Services Required    Requested Specialty:   Physical Therapy    Number of Visits Requested:   1    All questions were answered. The patient knows to call the clinic with any problems, questions or  concerns. The total time spent in the appointment was 40 minutes encounter with patients including review of chart and various tests results, discussions about plan of care and coordination of care plan   [name], MD 06/17/2019 12:22 PM  INTERVAL HISTORY: Please see below for problem oriented charting. She returns with her husband for further follow-up She had numerous questions today She had questions related to the future follow-up, Covid vaccination, other screening programs, her prognosis and others Overall, she is recovering well from recent surgery She has some pelvic floor dysfunction and some mild weakness since surgery and chemotherapy She has not noticed any more vaginal spotting recently She denies worsening neuropathy since completion of treatment No recent infection, fever or chills Her appetite is fair Her husband has questions related to vitamin supplementations She had lung lesion for many years.  She is not symptomatic  SUMMARY OF ONCOLOGIC HISTORY: Oncology History Overview Note  Negative genetics High grade serous   Peritoneal carcinoma ([place])  11/23/2018 Imaging   Ct abdomen and pelvis 1. Findings highly suspicious for peritoneal carcinomatosis. Recommend paracentesis for therapeutic and diagnostic purposes. I do not see an obvious primary lesion but there is some irregular enhancing soft tissue in the right adnexal area. CA 125 level may be helpful. 2. Mild surface irregularity involving the liver but I do not see any obvious changes of cirrhosis. No hepatic lesions.   11/23/2018 Tumor Marker   Patient's tumor was tested for the following markers: CA-125  Results of the tumor marker test revealed 253.   11/25/2018 Initial Diagnosis   Peritoneal carcinoma ([place])   11/25/2018 Imaging   US paracentesis Successful ultrasound-guided paracentesis yielding 3.4 L of peritoneal fluid.   12/01/2018 Cancer Staging   Staging form: Ovary, Fallopian Tube, and Primary  Peritoneal Carcinoma, AJCC 8th Edition - Clinical stage from 12/01/2018: cT3, cN0, cM0 - Signed by [name], MD on 12/01/2018   12/02/2018 Tumor Marker   Patient's tumor was tested for the following markers: CA-125 Results of the tumor marker test revealed 267   12/06/2018 -  Chemotherapy   The patient had carboplatin and taxol for chemotherapy treatment.     12/08/2018 Procedure   Successful ultrasound-guided therapeutic paracentesis yielding 1.7 liters of peritoneal fluid.   12/24/2018 Procedure   Successful placement of a right IJ approach Power Port with ultrasound and fluoroscopic guidance. The catheter is ready for use   01/17/2019 Tumor Marker   Patient's tumor was tested for the following markers: CA-125 Results of the tumor marker test revealed 57.2   01/31/2019 Imaging   Significant decrease in peritoneal carcinomatosis since previous study. Interval resolution of ascites.   Focal area of parenchymal consolidation in central right middle lobe, which measures 3 cm. Differential diagnosis includes infectious or inflammatory process, atelectasis, and neoplasm. Recommend short-term follow-up by chest CT in 2-3 months.     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   Surgeon: Donaciano Eva    Operative Findings:  : grossly normal uterus, ovaries normal, few scattered peritoneal nodules (72m) on serosa of uterus and tubes. The omentum (gastrocolic) was tethered to the mesentery with tumor (thin rind). Complete (optimal) resection of tumor with no gross residual disease.     02/17/2019 Pathology Results   FINAL MICROSCOPIC DIAGNOSIS: A. UTERUS, CERVIX, BILATERAL FALLOPIAN TUBES AND OVARIES, HYSTERECTOMY WITH SALPINGOOOPHORECTOMY: -  Uterus: Endometrium: Inactive endometrium. No hyperplasia or malignancy. Myometrium: Unremarkable. No malignancy. Serosa: Metastatic carcinoma. No malignancy. - Cervix: Benign squamous and endocervical mucosa. No dysplasia or malignancy. - Left ovary and fallopian tube: Metastatic carcinoma. - Right ovary: No malignancy identified. - Right fallopian tube: Luminal tumor, see comment. B. OMENTUM, RESECTION: - High grade serous carcinoma. - Deposits up to at least 3 cm. - See oncology table. ONCOLOGY TABLE: OVARY or FALLOPIAN TUBE or PRIMARY PERITONEUM: Procedure: Hysterectomy with bilateral salpingo-oophorectomy and omentectomy. Specimen Integrity: Intact Tumor Site: Peritoneum Ovarian Surface Involvement (required only if applicable): Left ovary Fallopian Tube Surface Involvement (required only if applicable): Left Fallopian tube Tumor Size: Largest deposit 3 cm Histologic Type: High-grade serous carcinoma Histologic Grade: High-grade Implants (required for advanced stage serous/seromucinous borderline tumors only): Uterine serosa, left fallopian tube and ovary, omentum Other Tissue/ Organ Involvement: As above Largest Extrapelvic Peritoneal Focus (required only if applicable): 3 cm Peritoneal/Ascitic Fluid: Pre neoadjuvant [id number] positive for carcinoma. Treatment Effect (required only for high-grade serous carcinomas): Probably partial treatment effect in omental tissue. Regional Lymph Nodes: No lymph nodes submitted or found Pathologic Stage Classification (pTNM, AJCC 8th Edition): ypT3c, ypNX Representative Tumor Block: B5 Comment(s): There is surface involvement of the left ovary and fallopian tube but no primary tumor. Within the right fallopian tube lumen there is detached fragments of tumor but again no precursor lesion is noted in the right fallopian tube. Thus, the tumor is presumed primary  peritoneal and staged as such.   03/21/2019 Tumor Marker   Patient's tumor was tested  for the following markers: CA-125 Results of the tumor marker test revealed 15.1    Genetic Testing   Negative genetic testing. No pathogenic variants identified on the [facility] CancerNext + RNAinsight panel. VUS in ATM called [id number] identified. The report date is 05/12/2019. TumorNext HRD was originally ordered but there was not enough sample to complete this testing.   The CancerNext+RNAinsight gene panel offered by [facility] includes sequencing and rearrangement analysis for the following 36 genes: APC*, ATM*, AXIN2, BARD1, BMPR1A, BRCA1*, BRCA2*, BRIP1*, CDH1*, CDK4, CDKN2A, CHEK2*, DICER1, MLH1*, MSH2*, MSH3, MSH6*, MUTYH*, NBN, NF1*, NTHL1, PALB2*, PMS2*, PTEN*, RAD51C*, RAD51D*, RECQL, SMAD4, SMARCA4, STK11 and TP53* (sequencing and deletion/duplication); HOXB13, POLD1 and POLE (sequencing only); EPCAM and GREM1 (deletion/duplication only). DNA and RNA analyses performed for * genes.    05/16/2019 Tumor Marker   Patient's tumor was tested for the following markers: CA-125 Results of the tumor marker test revealed 10.7   06/16/2019 Imaging   1. No evidence of residual or recurrent metastatic disease in the chest, abdomen or pelvis. 2. Masslike focus of consolidation in the right middle lobe along the major and minor fissures with associated volume loss is stable since 01/31/2019. Indolent primary pulmonary neoplasm not excluded. PET-CT may be considered for further characterization. 3. Aortic Atherosclerosis (ICD10-I70.0).   06/16/2019 Tumor Marker   Patient's tumor was tested for the following markers: CA-125 Results of the tumor marker test revealed 9.1.     REVIEW OF SYSTEMS:   Constitutional: Denies fevers, chills or abnormal weight loss Eyes: Denies blurriness of vision Ears, nose, mouth, throat, and face: Denies mucositis or sore throat Respiratory: Denies cough, dyspnea or wheezes Cardiovascular: Denies palpitation, chest discomfort or lower extremity  swelling Gastrointestinal:  Denies nausea, heartburn or change in bowel habits Skin: Denies abnormal skin rashes Lymphatics: Denies new lymphadenopathy or easy bruising Neurological:Denies numbness, tingling or new weaknesses Behavioral/Psych: Mood is stable, no new changes  All other systems were reviewed with the patient and are negative.  I have reviewed the past medical history, past surgical history, social history and family history with the patient and they are unchanged from previous note.  ALLERGIES:  is allergic to fentanyl; milk-related compounds; oxybutynin chloride; thimerosal; tramadol; avelox [moxifloxacin hcl in nacl]; doxycycline; erythromycin; metronidazole; minocycline; penicillins; quinolones; and sulfonamide derivatives.  MEDICATIONS:  Current Outpatient Medications  Medication Sig Dispense Refill  . acetaminophen (TYLENOL) 500 MG tablet Take 500 mg by mouth every 6 (six) hours as needed for moderate pain or headache.    . albuterol (VENTOLIN HFA) 108 (90 Base) MCG/ACT inhaler Inhale 2 puffs into the lungs every 6 (six) hours as needed for wheezing or shortness of breath. 18 g 1  . ARTIFICIAL TEAR SOLUTION OP Place 1 drop into both eyes daily as needed (dry eyes).    . Azelaic Acid (FINACEA) 15 % cream Apply 1 application topically daily.    . cromolyn (NASALCROM) 5.2 MG/ACT nasal spray Place 1 spray into the nose as needed for allergies.     Marland Kitchen gabapentin (NEURONTIN) 300 MG capsule Take 600 mg by mouth 3 (three) times daily.     Marland Kitchen guaiFENesin (MUCINEX) 600 MG 12 hr tablet Take 600 mg by mouth 2 (two) times daily as needed (congestion).    . Ketotifen Fumarate (ALLERGY EYE DROPS OP) Place 1 drop into both eyes daily as needed (allergies).    Marland Kitchen lidocaine-prilocaine (EMLA) cream  Apply to affected area once (Patient taking differently: Apply 1 application topically daily as needed (port access). ) 30 g 3  . loratadine (CLARITIN) 5 MG/5ML syrup Take 5 mg by mouth daily as  needed for allergies (bone pain related to chemo).    . mupirocin ointment (BACTROBAN) 2 % Apply 1 application topically as needed. (Patient taking differently: Apply 1 application topically as needed (wound care). ) 22 g 2  . ondansetron (ZOFRAN) 8 MG tablet Take 1 tablet (8 mg total) by mouth every 8 (eight) hours as needed for refractory nausea / vomiting. 30 tablet 1  . pantoprazole (PROTONIX) 40 MG tablet Take 1 tablet (40 mg total) by mouth daily. 30 tablet 3  . prochlorperazine (COMPAZINE) 10 MG tablet Take 1 tablet (10 mg total) by mouth every 6 (six) hours as needed (Nausea or vomiting). 30 tablet 1  . Risedronate Sodium 35 MG TBEC Take 1 tablet (35 mg total) by mouth once a week. (Patient taking differently: Take 1 tablet by mouth every Tuesday. ) 4 tablet 11  . Sulfacetamide Sodium, Acne, 10 % LOTN Apply 1 application topically at bedtime.     No current facility-administered medications for this visit.    PHYSICAL EXAMINATION: ECOG PERFORMANCE STATUS: 1 - Symptomatic but completely ambulatory  Vitals:   06/17/19 0840  BP: (!) 141/63  Pulse: 82  Resp: 18  Temp: 98.7 F (37.1 C)  SpO2: 98%   Filed Weights   06/17/19 0840  Weight: 105 lb 14.4 oz (48 kg)    GENERAL:alert, no distress and comfortable NEURO: alert & oriented x 3 with fluent speech, no focal motor/sensory deficits  LABORATORY DATA:  I have reviewed the data as listed    Component Value Date/Time   NA 140 06/16/2019 0955   K 3.8 06/16/2019 0955   CL 102 06/16/2019 0955   CO2 26 06/16/2019 0955   GLUCOSE 94 06/16/2019 0955   BUN 7 (L) 06/16/2019 0955   CREATININE 0.66 06/16/2019 0955   CALCIUM 9.6 06/16/2019 0955   PROT 7.8 06/16/2019 0955   ALBUMIN 4.4 06/16/2019 0955   AST 23 06/16/2019 0955   ALT 16 06/16/2019 0955   ALKPHOS 64 06/16/2019 0955   BILITOT 0.5 06/16/2019 0955   GFRNONAA >60 06/16/2019 0955   GFRAA >60 06/16/2019 0955    No results found for: SPEP, UPEP  Lab Results   Component Value Date   WBC 4.9 06/16/2019   NEUTROABS 2.7 06/16/2019   HGB 10.8 (L) 06/16/2019   HCT 33.3 (L) 06/16/2019   MCV 88.8 06/16/2019   PLT 233 06/16/2019      Chemistry      Component Value Date/Time   NA 140 06/16/2019 0955   K 3.8 06/16/2019 0955   CL 102 06/16/2019 0955   CO2 26 06/16/2019 0955   BUN 7 (L) 06/16/2019 0955   CREATININE 0.66 06/16/2019 0955      Component Value Date/Time   CALCIUM 9.6 06/16/2019 0955   ALKPHOS 64 06/16/2019 0955   AST 23 06/16/2019 0955   ALT 16 06/16/2019 0955   BILITOT 0.5 06/16/2019 0955       RADIOGRAPHIC STUDIES: I have reviewed multiple imaging studies with the patient and gave her copies of test results I have personally reviewed the radiological images as listed and agreed with the findings in the report. CT CHEST W CONTRAST  Result Date: 06/16/2019 CLINICAL DATA:  Primary peritoneal carcinoma diagnosed September 2020 status post chemotherapy. TAHBSO, radical tumor debulking and  omentectomy 02/17/2019. EXAM: CT CHEST, ABDOMEN, AND PELVIS WITH CONTRAST TECHNIQUE: Multidetector CT imaging of the chest, abdomen and pelvis was performed following the standard protocol during bolus administration of intravenous contrast. CONTRAST:  60m OMNIPAQUE IOHEXOL 300 MG/ML  SOLN COMPARISON:  01/31/2019 CT chest, abdomen and pelvis. FINDINGS: CT CHEST FINDINGS Cardiovascular: Normal heart size. No significant pericardial effusion/thickening. Right internal jugular Port-A-Cath terminates at the cavoatrial junction. Minimally atherosclerotic nonaneurysmal thoracic aorta. Normal caliber pulmonary arteries. No central pulmonary emboli. Mediastinum/Nodes: No discrete thyroid nodules. Unremarkable esophagus. No pathologically enlarged axillary, mediastinal or hilar lymph nodes. Lungs/Pleura: No pneumothorax. No pleural effusion. Masslike 3.5 x 2.2 cm focus of consolidation in the right middle lobe along the major and minor fissures with associated  volume loss (series 6/image 70), previously 3.4 x 2.2 cm on 01/31/2019 chest CT using similar measurement technique, not appreciably changed. No acute consolidative airspace disease or significant pulmonary nodules. Musculoskeletal:  No aggressive appearing focal osseous lesions. CT ABDOMEN PELVIS FINDINGS Hepatobiliary: Normal liver with no liver mass. Normal gallbladder with no radiopaque cholelithiasis. No biliary ductal dilatation. Pancreas: Normal, with no mass or duct dilation. Spleen: Normal size spleen. Low-attenuation subcapsular 1.0 cm posterior splenic lesion (series 2/image 60), stable. No new splenic lesions. Adrenals/Urinary Tract: Normal adrenals. Normal kidneys with no hydronephrosis and no renal mass. Normal bladder. Stomach/Bowel: Normal non-distended stomach. Normal caliber small bowel with no small bowel wall thickening. Normal appendix. Oral contrast transits to the rectum. No large bowel wall thickening, diverticulosis or acute pericolonic fat stranding. Vascular/Lymphatic: Atherosclerotic nonaneurysmal abdominal aorta. Patent portal, splenic, hepatic and renal veins. Circumaortic left renal vein. No pathologically enlarged lymph nodes in the abdomen or pelvis. Reproductive: Status post hysterectomy, with no abnormal findings at the vaginal cuff. No adnexal mass. Other: No pneumoperitoneum, ascites or focal fluid collection. No evidence of residual or recurrent peritoneal nodularity. Musculoskeletal: No aggressive appearing focal osseous lesions. IMPRESSION: 1. No evidence of residual or recurrent metastatic disease in the chest, abdomen or pelvis. 2. Masslike focus of consolidation in the right middle lobe along the major and minor fissures with associated volume loss is stable since 01/31/2019. Indolent primary pulmonary neoplasm not excluded. PET-CT may be considered for further characterization. 3. Aortic Atherosclerosis (ICD10-I70.0). Electronically Signed   By: JIlona SorrelM.D.   On:  06/16/2019 14:01   CT ABDOMEN PELVIS W CONTRAST  Result Date: 06/16/2019 CLINICAL DATA:  Primary peritoneal carcinoma diagnosed September 2020 status post chemotherapy. TAHBSO, radical tumor debulking and omentectomy 02/17/2019. EXAM: CT CHEST, ABDOMEN, AND PELVIS WITH CONTRAST TECHNIQUE: Multidetector CT imaging of the chest, abdomen and pelvis was performed following the standard protocol during bolus administration of intravenous contrast. CONTRAST:  768mOMNIPAQUE IOHEXOL 300 MG/ML  SOLN COMPARISON:  01/31/2019 CT chest, abdomen and pelvis. FINDINGS: CT CHEST FINDINGS Cardiovascular: Normal heart size. No significant pericardial effusion/thickening. Right internal jugular Port-A-Cath terminates at the cavoatrial junction. Minimally atherosclerotic nonaneurysmal thoracic aorta. Normal caliber pulmonary arteries. No central pulmonary emboli. Mediastinum/Nodes: No discrete thyroid nodules. Unremarkable esophagus. No pathologically enlarged axillary, mediastinal or hilar lymph nodes. Lungs/Pleura: No pneumothorax. No pleural effusion. Masslike 3.5 x 2.2 cm focus of consolidation in the right middle lobe along the major and minor fissures with associated volume loss (series 6/image 70), previously 3.4 x 2.2 cm on 01/31/2019 chest CT using similar measurement technique, not appreciably changed. No acute consolidative airspace disease or significant pulmonary nodules. Musculoskeletal:  No aggressive appearing focal osseous lesions. CT ABDOMEN PELVIS FINDINGS Hepatobiliary: Normal liver with no liver mass.  Normal gallbladder with no radiopaque cholelithiasis. No biliary ductal dilatation. Pancreas: Normal, with no mass or duct dilation. Spleen: Normal size spleen. Low-attenuation subcapsular 1.0 cm posterior splenic lesion (series 2/image 60), stable. No new splenic lesions. Adrenals/Urinary Tract: Normal adrenals. Normal kidneys with no hydronephrosis and no renal mass. Normal bladder. Stomach/Bowel: Normal  non-distended stomach. Normal caliber small bowel with no small bowel wall thickening. Normal appendix. Oral contrast transits to the rectum. No large bowel wall thickening, diverticulosis or acute pericolonic fat stranding. Vascular/Lymphatic: Atherosclerotic nonaneurysmal abdominal aorta. Patent portal, splenic, hepatic and renal veins. Circumaortic left renal vein. No pathologically enlarged lymph nodes in the abdomen or pelvis. Reproductive: Status post hysterectomy, with no abnormal findings at the vaginal cuff. No adnexal mass. Other: No pneumoperitoneum, ascites or focal fluid collection. No evidence of residual or recurrent peritoneal nodularity. Musculoskeletal: No aggressive appearing focal osseous lesions. IMPRESSION: 1. No evidence of residual or recurrent metastatic disease in the chest, abdomen or pelvis. 2. Masslike focus of consolidation in the right middle lobe along the major and minor fissures with associated volume loss is stable since 01/31/2019. Indolent primary pulmonary neoplasm not excluded. PET-CT may be considered for further characterization. 3. Aortic Atherosclerosis (ICD10-I70.0). Electronically Signed   By: Ilona Sorrel M.D.   On: 06/16/2019 14:01

## 2019-06-17 NOTE — Telephone Encounter (Signed)
She called back. She did not share how she was really feeling today during visit. Today is a good day. Some days she feeling sickly and does not want to do anything.She does not know why she did not share that in her visit. She has vaginal cramps at times. She also has a heaviness feeling in vagina most days. She would like a call back to her land line after Dr. Alvy Bimler has seen this message.

## 2019-06-17 NOTE — Assessment & Plan Note (Signed)
She had a lesion in her lung for many years We will observe only

## 2019-06-17 NOTE — Telephone Encounter (Signed)
Scheduled appt per 4/2 sch message- unable to reach pt and unable to leave vmail - mailed letter with appt

## 2019-06-17 NOTE — Assessment & Plan Note (Signed)
She denies worsening neuropathy She will continue taking gabapentin

## 2019-06-17 NOTE — Assessment & Plan Note (Signed)
I have reviewed blood work and CT imaging with the patient and her husband There are no signs of disease Her tumor marker is normal She had lung lesion for many years and that is stable We discussed the risk of disease relapse She would like to keep her port for now I will schedule port flush every 6 weeks and blood work along with tumor marker every 12 weeks She will see GYN surgeon in 3 months and I will see her back in 6 months for further follow-up There is no contraindication for her to receive Covid vaccination We discussed long-term follow-up and prognosis She can resume primary care visit in the near future She can resume screening mammogram in the future

## 2019-06-17 NOTE — Assessment & Plan Note (Signed)
Her blood count is stable She is not symptomatic Observe only I anticipate her blood count will return back to normal in a few months

## 2019-06-17 NOTE — Assessment & Plan Note (Signed)
She is debilitated since chemotherapy and surgery She has some pelvic floor dysfunction We discussed the role of physical therapy referral and she agreed to proceed

## 2019-06-20 ENCOUNTER — Telehealth: Payer: Self-pay | Admitting: Oncology

## 2019-06-20 NOTE — Telephone Encounter (Signed)
[name] [name] with message from Dr. Alvy Bimler.  She verbalized agreement and would like to try physical therapy/pelvic rehab and wait a few weeks before scheduling an appointment with Dr. Denman George.  She is going to call back when she would like to schedule it.

## 2019-06-20 NOTE — Telephone Encounter (Signed)
I spent almost an hours with the patient in the previous visit I suggested physical therapy referral for stamina and pelvic floor therapy She is still recovering from recent treatment Ct showed not disease in her pelvis but if she wants that evaluated we can get her see Dr. Denman George sooner  Santiago Glad, can you call her later?

## 2019-06-23 ENCOUNTER — Encounter: Payer: Self-pay | Admitting: Hematology and Oncology

## 2019-07-15 ENCOUNTER — Encounter: Payer: Self-pay | Admitting: Hematology and Oncology

## 2019-07-28 ENCOUNTER — Inpatient Hospital Stay: Payer: Medicare Other | Attending: Gynecologic Oncology

## 2019-07-28 ENCOUNTER — Other Ambulatory Visit: Payer: Self-pay

## 2019-07-28 DIAGNOSIS — Z452 Encounter for adjustment and management of vascular access device: Secondary | ICD-10-CM | POA: Insufficient documentation

## 2019-07-28 DIAGNOSIS — C482 Malignant neoplasm of peritoneum, unspecified: Secondary | ICD-10-CM | POA: Diagnosis present

## 2019-07-28 DIAGNOSIS — Z8542 Personal history of malignant neoplasm of other parts of uterus: Secondary | ICD-10-CM | POA: Diagnosis present

## 2019-07-28 MED ORDER — HEPARIN SOD (PORK) LOCK FLUSH 100 UNIT/ML IV SOLN
500.0000 [IU] | Freq: Once | INTRAVENOUS | Status: AC
  Administered 2019-07-28: 500 [IU]
  Filled 2019-07-28: qty 5

## 2019-08-03 ENCOUNTER — Encounter: Payer: Self-pay | Admitting: Hematology and Oncology

## 2019-08-04 ENCOUNTER — Telehealth: Payer: Self-pay | Admitting: Oncology

## 2019-08-04 NOTE — Telephone Encounter (Signed)
[name] [name] regarding her upcoming appointments for Dr. Alvy Bimler, Dr. Denman George and for port flush/labs.  She is aware of all the appointments.

## 2019-08-08 ENCOUNTER — Encounter: Payer: Self-pay | Admitting: Physical Therapy

## 2019-08-08 ENCOUNTER — Ambulatory Visit: Payer: Medicare Other | Attending: Hematology and Oncology | Admitting: Physical Therapy

## 2019-08-08 ENCOUNTER — Other Ambulatory Visit: Payer: Self-pay

## 2019-08-08 DIAGNOSIS — R2689 Other abnormalities of gait and mobility: Secondary | ICD-10-CM

## 2019-08-08 DIAGNOSIS — M6281 Muscle weakness (generalized): Secondary | ICD-10-CM | POA: Diagnosis not present

## 2019-08-08 DIAGNOSIS — R279 Unspecified lack of coordination: Secondary | ICD-10-CM | POA: Insufficient documentation

## 2019-08-08 NOTE — Therapy (Signed)
[facility] [address] [place], [place], [address] Phone: [phone]   Fax:  [phone]  Physical Therapy Evaluation  Patient Details  Name: [name] [name] MRN: [id number] Date of Birth: 06-Sep-1950 Referring Provider (PT): Dr. [name]   Encounter Date: 08/08/2019  PT End of Session - 08/08/19 1407    Visit Number  1    Date for PT Re-Evaluation  10/03/19    Authorization Type  Medicare BCBS    PT Start Time  0845    PT Stop Time  0930    PT Time Calculation (min)  45 min    Activity Tolerance  Patient tolerated treatment well    Behavior During Therapy  [facility] for tasks assessed/performed       Past Medical History:  Diagnosis Date  . Allergic rhinitis   . Cervical dysplasia 1980  . Cervicalgia   . Eczema    Rosacea,dermatitis-Dr [employer]  . Esophageal reflux   . Family history of colon cancer   . Family history of melanoma   . Family history of ovarian cancer   . Family history of pancreatic cancer   . Family history of prostate cancer   . Family history of uterine cancer   . Fibromyalgia   . GERD (gastroesophageal reflux disease)   . Glaucoma, narrow-angle   . Heart murmur   . Hiatal hernia   . Irritable bowel syndrome   . LBBB (left bundle branch block)   . Left bundle branch block   . 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 Mitral valve disorders(424.0)    No antibiotics required  . Osteoporosis 12/2016   T score -2.3, 2014 T score -2.5 AP spine  . PAC (premature atrial contraction) 2012   ,PVC's, and nonsustained atril tachycardia w aberration by heart monitor   . PVC (premature ventricular contraction)   . Rosacea   . Stricture and stenosis of esophagus     Past Surgical History:  Procedure Laterality Date  . BREAST LUMPECTOMY  1998   right breast with radiation  . CERVICAL DISCECTOMY      C3-5 with bone graft  . GYNECOLOGIC CRYOSURGERY  1980  . IR IMAGING GUIDED PORT INSERTION  12/24/2018  . NECK SURGERY  [phone]   bone graft and fusion  . REFRACTIVE SURGERY     narrow angle glucoma  . RHINOPLASTY  1976  . TUBAL LIGATION  1977    There were no vitals filed for this visit.   Subjective Assessment - 08/08/19 1406    Subjective  I am having issues with weakness, endurance, going up steps, balance and bladder issues.    Patient Stated Goals  build endurance, strength, improve balance and bladder issues    Currently in Pain?  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 [name]    Onset Date/Surgical Date  11/25/18    Prior Therapy  none      Precautions   Precautions  Other (comment)    Precaution Comments  cancer; osteoporosis      Restrictions   Weight Bearing Restrictions  No      Balance Screen   Has the  patient fallen in the past 6 months  No    Has the patient had a decrease in activity level because of a fear of falling?   No    Is the patient reluctant to leave their home because of a fear of falling?   No      Home Film/video editor residence      Prior Function   Level of Independence  Independent    Vocation  Retired      Associate Professor   Overall Cognitive Status  Within Functional Limits for tasks assessed      Posture/Postural Control   Posture/Postural Control  No significant limitations      ROM / Strength   AROM / PROM / Strength  AROM;PROM;Strength      Strength   Overall Strength Comments  bilateral UE strength 4/5    Right Hip Flexion  4/5    Right Hip Extension  4/5    Right Hip ABduction  3+/5    Left Hip Flexion  4/5    Left Hip Extension  4/5    Left Hip ABduction  4-/5    Right Knee Flexion  4/5    Right Knee Extension  4/5    Left Knee Flexion  4/5    Left  Knee Extension  4/5    Right Ankle Dorsiflexion  3+/5    Right Ankle Eversion  4/5    Left Ankle Dorsiflexion  3+/5    Left Ankle Eversion  4/5      Palpation   Palpation comment  tenderness located in left lower abdomen, tightness in the scar  on the abdomen, Decreased rib exursion      Ambulation/Gait   Ambulation/Gait  Yes      6 minute walk test results    Endurance additional comments  1282 feet; 6 minutes      Standardized Balance Assessment   Standardized Balance Assessment  Five Times Sit to Stand;Berg Balance Test    Five times sit to stand comments   12.8 sec      Berg Balance Test   Sit to Stand  Able to stand without using hands and stabilize independently    Standing Unsupported  Able to stand 2 minutes with supervision    Sitting with Back Unsupported but Feet Supported on Floor or Stool  Able to sit safely and securely 2 minutes    Stand to Sit  Controls descent by using hands    Transfers  Able to transfer safely, definite need of hands    Standing Unsupported with Eyes Closed  Able to stand 10 seconds with supervision    Standing Unsupported with Feet Together  Needs help to attain position but able to stand for 30 seconds with feet together    From Standing, Reach Forward with Outstretched Arm  Can reach forward >5 cm safely (2")    From Standing Position, Pick up Object from Floor  Able to pick up shoe safely and easily    From Standing Position, Turn to Look Behind Over each Shoulder  Looks behind from both sides and weight shifts well    Turn 360 Degrees  Able to turn 360 degrees safely but slowly    Standing Unsupported, Alternately Place Feet on Step/Stool  Able to complete 4 steps without aid or supervision    Standing Unsupported, One Foot in Front  Able to take small step independently and hold 30 seconds  Standing on One Leg  Able to lift leg independently and hold equal to or more than 3 seconds    Total Score  39    Berg comment:  80% chance of  falling                  Objective measurements completed on examination: See above findings.    Pelvic Floor Special Questions - 08/08/19 0001    Urinary Leakage  Yes    Activities that cause leaking  Other    Other activities that cause leaking  waiting too long to go to the bathroom    Urinary urgency  Yes    Urinary frequency  going to the bathroom every hour, sometimes will dribble after uriation and have to go to the bathroom again    Fecal incontinence  No    Pelvic Floor Internal Exam  Patient confirms identifiction and approves PT to assess the pelvic floor and treatment externally    Exam Type  Vaginal                 PT Short Term Goals - 08/08/19 1408      PT SHORT TERM GOAL #1   Title  independent with initial exercise program    Time  4    Period  Weeks    Status  New    Target Date  09/05/19      PT SHORT TERM GOAL #2   Title  understand ways to manage her balance to reduce the risk of falls    Time  4    Period  Weeks    Status  New    Target Date  09/05/19      PT SHORT TERM GOAL #3   Title  Berg balance score >/= 43/56    Time  4    Period  Weeks    Status  New    Target Date  10/03/19        PT Long Term Goals - 08/08/19 1753      PT LONG TERM GOAL #1   Title  independent with advanced HEP    Time  8    Period  Weeks    Status  New    Target Date  10/03/19      PT LONG TERM GOAL #2   Title  Berg balance score >/= 48/56    Time  8    Period  Weeks    Status  New    Target Date  10/03/19      PT LONG TERM GOAL #3   Title  able to go up and down the stairs with railing 4 times in the gym with minimal to no fatique due to increased endurance    Time  8    Period  Weeks    Status  New    Target Date  10/03/19      PT LONG TERM GOAL #4   Title  able to go on an outing with her husband and not having to hold onto her arm due to increased endurance and strength    Time  8    Period  Weeks    Status  New    Target  Date  10/03/19      PT LONG TERM GOAL #5   Title  report her fatique has decreased >/= 50% due to increased endurance and strength    Time  8    Period  Weeks    Status  New    Target Date  10/03/19             Plan - 08/08/19 1741    Clinical Impression Statement  Patient is a 69 year old female with diagnosis of peritoneal carcinoma, 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 Patient has finished chemotherapy. Patient has a history of breast cancer with radiation 1998 and cervical disectomy from C3-C5 with bone graft. When patient lays down she has to hold her head to support her neck due to her cervical surgery. Upper extremity and lower extremity strength is 4/5 with exception of right hip abduction, and bilateral ankle dorsiflexion strength 3+/5. Patient is able to walk 1282 feet in 6 minutes without an assistive device. She is able to go from sit to stand 5 times in 12/8 seconds. Her Berg balance score is 39/56 indicating 80% chance of a fall. She had most difficulty with single leg stance, tandem stance, reaching forward, toe taps, and standing with a small base of support. Patient has tightness in the abdomen with her surgical scar, lower abdomina land left upper quadrant. Patient has decreased rib excursion. Patient reports she has trouble going up and down stair and walking for long distances. She will have to hold her husbands arm to assist her. Patient reports difficulty with emptying her bladder and will dribble after she urinates. When she contract her pelvic floor she will tighten posteriorly but not anteriorly. Patient has trouble bulging the pelvic floor to relax.  Patient will benefit from skilled therapy to improve strength, endurance, and pelvic floor coordination.    Personal Factors and Comorbidities  Comorbidity 3+;Age;Fitness    Comorbidities  fibromyalgia; osteoporosis; cervical disectomy with bone graft; peritonial carcionma; robotic assisted laparoscopic total hysterectomy with  bilateral salpingoophorectomy    Examination-Activity Limitations  Locomotion Level;Bed Mobility;Carry;Stairs;Stand;Lift;Toileting    Examination-Participation Restrictions  Cleaning;Meal Prep;Community Activity;Driving;Laundry    Stability/Clinical Decision Making  Evolving/Moderate complexity    Clinical Decision Making  Moderate    Rehab Potential  Good    PT Frequency  2x / week    PT Duration  8 weeks    PT Treatment/Interventions  ADLs/Self Care Home Management;Neuromuscular re-education;Therapeutic exercise;Balance training;Therapeutic activities;Functional mobility training;Stair training;Gait training;Patient/family education;Manual techniques;Energy conservation;Dry needling;Scar mobilization;Taping    PT Next Visit Plan  Patient need a HEP for balance, hip, knee and ankle strength, she will be trying to come 1 time per week to have her HEP adjusted; when she sees Malachy Mood she will work on the pelvic floor and scar massage for the abdomen    Consulted and Agree with Plan of Care  Patient       Patient will benefit from skilled therapeutic intervention in order to improve the following deficits and impairments:  Decreased coordination, Difficulty walking, Increased fascial restricitons, Pain, Decreased strength, Decreased mobility, Decreased balance, Decreased scar mobility, Decreased activity tolerance, Decreased endurance  Visit Diagnosis: Muscle weakness (generalized) - Plan: PT plan of care cert/re-cert  Other abnormalities of gait and mobility - Plan: PT plan of care cert/re-cert  Unspecified lack of coordination - Plan: PT plan of care cert/re-cert     Problem List Patient Active Problem List   Diagnosis Date Noted  . Physical debility 06/17/2019  . Tachycardia 05/17/2019  . Rosacea 05/17/2019  . Lesion of lung 05/16/2019  . Genetic testing 05/16/2019  . Mucositis due to chemotherapy 04/25/2019  . 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 Goals of  care, counseling/discussion 03/21/2019  .  Family history of ovarian cancer   . Family history of uterine cancer   . Family history of melanoma   . Family history of prostate cancer   . Family history of pancreatic cancer   . Family history of colon cancer   . Anemia due to antineoplastic chemotherapy 01/18/2019  . Bone pain 12/27/2018  . Glaucoma, narrow-angle   . Peritoneal carcinoma ([place]) 11/25/2018  . LBBB (left bundle branch block) 05/18/2013  . Atrial tachycardia, paroxysmal ([place]) 05/18/2013  . PVC (premature ventricular contraction)   . PAC (premature atrial contraction)   . 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 IBS (irritable bowel syndrome) 11/12/2010  . Bacterial overgrowth syndrome 11/12/2010  . GERD (gastroesophageal reflux disease) 11/12/2010  . PERIPHERAL NEUROPATHY 04/17/2008  . 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 ESOPHAGEAL STRICTURE 04/17/2008  . Diaphragmatic hernia 04/17/2008  . NECK PAIN 04/17/2008    Earlie Counts, PT 08/08/19 5:59 PM   [facility] [address], [place], [place], [address] Phone: [phone]   Fax:  [phone]  Name: [name] [name] MRN: [id number] Date of Birth: 09-Mar-1951

## 2019-08-10 ENCOUNTER — Other Ambulatory Visit: Payer: Self-pay

## 2019-08-10 ENCOUNTER — Ambulatory Visit: Payer: Medicare Other | Admitting: Physical Therapy

## 2019-08-10 ENCOUNTER — Encounter: Payer: Self-pay | Admitting: Physical Therapy

## 2019-08-10 DIAGNOSIS — M6281 Muscle weakness (generalized): Secondary | ICD-10-CM

## 2019-08-10 DIAGNOSIS — R2689 Other abnormalities of gait and mobility: Secondary | ICD-10-CM

## 2019-08-10 DIAGNOSIS — R279 Unspecified lack of coordination: Secondary | ICD-10-CM

## 2019-08-10 NOTE — Therapy (Signed)
[facility] [address] [place], [place], [address] Phone: [phone]   Fax:  [phone]  Physical Therapy Treatment  Patient Details  Name: [name] [name] MRN: [id number] Date of Birth: 1950-06-10 Referring Provider (PT): Dr. [name]   Encounter Date: 08/10/2019  PT End of Session - 08/10/19 1322    Visit Number  2    Date for PT Re-Evaluation  10/03/19    Authorization Type  Medicare BCBS    PT Start Time  1145    PT Stop Time  1225    PT Time Calculation (min)  40 min    Activity Tolerance  Patient tolerated treatment well    Behavior During Therapy  [facility] for tasks assessed/performed       Past Medical History:  Diagnosis Date  . Allergic rhinitis   . Cervical dysplasia 1980  . Cervicalgia   . Eczema    Rosacea,dermatitis-Dr [employer]  . Esophageal reflux   . Family history of colon cancer   . Family history of melanoma   . Family history of ovarian cancer   . Family history of pancreatic cancer   . Family history of prostate cancer   . Family history of uterine cancer   . Fibromyalgia   . GERD (gastroesophageal reflux disease)   . Glaucoma, narrow-angle   . Heart murmur   . Hiatal hernia   . Irritable bowel syndrome   . LBBB (left bundle branch block)   . Left bundle branch block   . 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 Mitral valve disorders(424.0)    No antibiotics required  . Osteoporosis 12/2016   T score -2.3, 2014 T score -2.5 AP spine  . PAC (premature atrial contraction) 2012   ,PVC's, and nonsustained atril tachycardia w aberration by heart monitor   . PVC (premature ventricular contraction)   . Rosacea   . Stricture and stenosis of esophagus     Past Surgical History:  Procedure Laterality Date  . BREAST LUMPECTOMY  1998   right breast with radiation  . CERVICAL DISCECTOMY      C3-5 with bone graft  . GYNECOLOGIC CRYOSURGERY  1980  . IR IMAGING GUIDED PORT INSERTION  12/24/2018  . NECK SURGERY  [phone]   bone graft and fusion  . REFRACTIVE SURGERY     narrow angle glucoma  . RHINOPLASTY  1976  . TUBAL LIGATION  1977    There were no vitals filed for this visit.                     [place] [facility] PT Treatment/Exercise - 08/10/19 0001      Lumbar Exercises: Aerobic   Nustep  SEat # 5, arms #11 level 1, while assessing patient and monitoring for fatique      Knee/Hip Exercises: Stretches   Active Hamstring Stretch  Right;Left;1 rep;30 seconds    Active Hamstring Stretch Limitations  sitting    Piriformis Stretch  Right;Left;1 rep;30 seconds    Piriformis Stretch Limitations  sititng    Gastroc Stretch  Right;Left;1 rep;30 seconds    Gastroc Stretch Limitations  standing      Knee/Hip Exercises: Standing   Hip Abduction  Stengthening;Right;Left;10 reps;Knee straight;2 sets    Abduction Limitations  holding onto bar    Hip Extension  Stengthening;Right;Left;2 sets;10 reps  Manual Therapy   Manual Therapy  Soft tissue mobilization;Myofascial release    Soft tissue mobilization  abdominal scar mobilization to improve mobility    Myofascial Release  along the abdomen to improve tissue release and expand the abdomen with diaphragmatic breathing             PT Education - 08/10/19 1236    Education Details  Access Code: [email or web address]    Person(s) Educated  Patient    Methods  Explanation;Demonstration;Verbal cues;Handout    Comprehension  Returned demonstration;Verbalized understanding       PT Short Term Goals - 08/08/19 1408      PT SHORT TERM GOAL #1   Title  independent with initial exercise program    Time  4    Period  Weeks    Status  New    Target Date  09/05/19      PT SHORT TERM GOAL #2   Title  understand ways to manage her balance to reduce the risk of falls    Time  4    Period  Weeks    Status  New     Target Date  09/05/19      PT SHORT TERM GOAL #3   Title  Berg balance score >/= 43/56    Time  4    Period  Weeks    Status  New    Target Date  10/03/19        PT Long Term Goals - 08/08/19 1753      PT LONG TERM GOAL #1   Title  independent with advanced HEP    Time  8    Period  Weeks    Status  New    Target Date  10/03/19      PT LONG TERM GOAL #2   Title  Berg balance score >/= 48/56    Time  8    Period  Weeks    Status  New    Target Date  10/03/19      PT LONG TERM GOAL #3   Title  able to go up and down the stairs with railing 4 times in the gym with minimal to no fatique due to increased endurance    Time  8    Period  Weeks    Status  New    Target Date  10/03/19      PT LONG TERM GOAL #4   Title  able to go on an outing with her husband and not having to hold onto her arm due to increased endurance and strength    Time  8    Period  Weeks    Status  New    Target Date  10/03/19      PT LONG TERM GOAL #5   Title  report her fatique has decreased >/= 50% due to increased endurance and strength    Time  8    Period  Weeks    Status  New    Target Date  10/03/19            Plan - 08/10/19 1236    Clinical Impression Statement  Patient was able to tolerate her HEP but fatiques easily. Patient was able to do 5 minutes on the nustep but fatique afterwards. Improved mobility of scar after manual work. Patient is not ready for weights yet. Patient will benefit from skilled therapy to improve strength, endurance, and pelvic floor coordination.    Personal  Factors and Comorbidities  Comorbidity 3+;Age;Fitness    Comorbidities  fibromyalgia; osteoporosis; cervical disectomy with bone graft; peritonial carcionma; robotic assisted laparoscopic total hysterectomy with bilateral salpingoophorectomy    Examination-Activity Limitations  Locomotion Level;Bed Mobility;Carry;Stairs;Stand;Lift;Toileting    Examination-Participation Restrictions  Cleaning;Meal  Prep;Community Activity;Driving;Laundry    Stability/Clinical Decision Making  Evolving/Moderate complexity    Rehab Potential  Good    PT Frequency  2x / week    PT Duration  8 weeks    PT Treatment/Interventions  ADLs/Self Care Home Management;Neuromuscular re-education;Therapeutic exercise;Balance training;Therapeutic activities;Functional mobility training;Stair training;Gait training;Patient/family education;Manual techniques;Energy conservation;Dry needling;Scar mobilization;Taping    PT Next Visit Plan  Patient need a HEP for balance, hip, knee and ankle strength, she will be trying to come 1 time per week to have her HEP adjusted; when she sees Malachy Mood she will work on the pelvic floor and scar massage for the abdomen    PT Home Exercise Plan  Access Code: [email or web address]    Recommended Other Services  MD signed MD initial note    Consulted and Agree with Plan of Care  Patient       Patient will benefit from skilled therapeutic intervention in order to improve the following deficits and impairments:  Decreased coordination, Difficulty walking, Increased fascial restricitons, Pain, Decreased strength, Decreased mobility, Decreased balance, Decreased scar mobility, Decreased activity tolerance, Decreased endurance  Visit Diagnosis: Muscle weakness (generalized)  Other abnormalities of gait and mobility  Unspecified lack of coordination     Problem List Patient Active Problem List   Diagnosis Date Noted  . Physical debility 06/17/2019  . Tachycardia 05/17/2019  . Rosacea 05/17/2019  . Lesion of lung 05/16/2019  . Genetic testing 05/16/2019  . Mucositis due to chemotherapy 04/25/2019  . Peripheral neuropathy due to chemotherapy ([place]) 03/21/2019  . Goals of care, counseling/discussion 03/21/2019  . Family history of ovarian cancer   . Family history of uterine cancer   . Family history of melanoma   . Family history of prostate cancer   . Family history of pancreatic cancer   .  Family history of colon cancer   . Anemia due to antineoplastic chemotherapy 01/18/2019  . Bone pain 12/27/2018  . Glaucoma, narrow-angle   . Peritoneal carcinoma ([place]) 11/25/2018  . LBBB (left bundle branch block) 05/18/2013  . Atrial tachycardia, paroxysmal ([place]) 05/18/2013  . PVC (premature ventricular contraction)   . PAC (premature atrial contraction)   . 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 IBS (irritable bowel syndrome) 11/12/2010  . Bacterial overgrowth syndrome 11/12/2010  . GERD (gastroesophageal reflux disease) 11/12/2010  . PERIPHERAL NEUROPATHY 04/17/2008  . 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 ESOPHAGEAL STRICTURE 04/17/2008  . Diaphragmatic hernia 04/17/2008  . NECK PAIN 04/17/2008    Earlie Counts, PT 08/10/19 2:09 PM   [facility] [address], [place], [place], [address] Phone: [phone]   Fax:  [phone]  Name: [name] [name] MRN: [id number] Date of Birth: Jun 25, 1950

## 2019-08-10 NOTE — Patient Instructions (Signed)
Access Code: [email or web address] URL: [email or web address] Date: 08/10/2019 Prepared by: Earlie Counts  Exercises Standing Heel Raise with Support - 1 x daily - 7 x weekly - 2 sets - 10 reps Seated Hamstring Stretch - 1 x daily - 7 x weekly - 1 sets - 1 reps - 30 sec hold Standing Hip Abduction with Counter Support - 1 x daily - 7 x weekly - 2 sets - 10 reps Seated Piriformis Stretch with Trunk Bend - 1 x daily - 7 x weekly - 1 sets - 1 reps - 30 sec hold Standing Hip Extension with Counter Support - 1 x daily - 7 x weekly - 2 sets - 10 reps Standing Gastroc Stretch at Counter - 1 x daily - 7 x weekly - 1 sets - 1 reps - 30 sec hold [facility] [address] [place],  [address] Phone # [phone] Fax [phone]

## 2019-08-25 ENCOUNTER — Ambulatory Visit: Payer: Medicare Other | Attending: Hematology and Oncology | Admitting: Physical Therapy

## 2019-08-25 ENCOUNTER — Encounter: Payer: Self-pay | Admitting: Physical Therapy

## 2019-08-25 ENCOUNTER — Other Ambulatory Visit: Payer: Self-pay

## 2019-08-25 DIAGNOSIS — M6281 Muscle weakness (generalized): Secondary | ICD-10-CM | POA: Diagnosis not present

## 2019-08-25 DIAGNOSIS — R2689 Other abnormalities of gait and mobility: Secondary | ICD-10-CM

## 2019-08-25 DIAGNOSIS — R279 Unspecified lack of coordination: Secondary | ICD-10-CM | POA: Insufficient documentation

## 2019-08-25 NOTE — Patient Instructions (Signed)
Access Code: [email or web address] URL: [email or web address] Date: 08/25/2019 Prepared by: Earlie Counts  Exercises Standing Heel Raise with Support - 1 x daily - 7 x weekly - 2 sets - 10 reps Seated Hamstring Stretch - 1 x daily - 7 x weekly - 1 sets - 1 reps - 30 sec hold Standing Hip Abduction with Counter Support - 1 x daily - 7 x weekly - 2 sets - 10 reps Seated Piriformis Stretch with Trunk Bend - 1 x daily - 7 x weekly - 1 sets - 1 reps - 30 sec hold Standing Hip Extension with Counter Support - 1 x daily - 7 x weekly - 2 sets - 10 reps Standing Gastroc Stretch at Counter - 1 x daily - 7 x weekly - 1 sets - 1 reps - 30 sec hold Seated Long Arc Quad with Ankle Weight - 1 x daily - 7 x weekly - 1 sets - 10 reps Seated Overhead Press with Dumbbells - 1 x daily - 7 x weekly - 10 reps Seated Single Arm Shoulder Flexion with Dumbbells - 1 x daily - 7 x weekly - 1 sets - 10 reps [facility] [address] [place], [place] [address] Phone # [phone] Fax [phone]

## 2019-08-25 NOTE — Therapy (Signed)
[facility] [address] [place], [place], [address] Phone: [phone]   Fax:  [phone]  Physical Therapy Treatment  Patient Details  Name: [name] [name] MRN: [id number] Date of Birth: [birth year] Referring Provider (PT): Dr. [name]   Encounter Date: 08/25/2019   PT End of Session - 08/25/19 1152    Visit Number 3    Date for PT Re-Evaluation 10/03/19    Authorization Type Medicare BCBS    PT Start Time 1100    PT Stop Time 1143    PT Time Calculation (min) 43 min    Activity Tolerance Patient tolerated treatment well;No increased pain    Behavior During Therapy WFL for tasks assessed/performed           Past Medical History:  Diagnosis Date  . Allergic rhinitis   . Cervical dysplasia 1980  . Cervicalgia   . Eczema    Rosacea,dermatitis-Dr [employer]  . Esophageal reflux   . Family history of colon cancer   . Family history of melanoma   . Family history of ovarian cancer   . Family history of pancreatic cancer   . Family history of prostate cancer   . Family history of uterine cancer   . Fibromyalgia   . GERD (gastroesophageal reflux disease)   . Glaucoma, narrow-angle   . Heart murmur   . Hiatal hernia   . Irritable bowel syndrome   . LBBB (left bundle branch block)   . Left bundle branch block   . 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 Mitral valve disorders(424.0)    No antibiotics required  . Osteoporosis 12/2016   T score -2.3, 2014 T score -2.5 AP spine  . PAC (premature atrial contraction) 2012   ,PVC's, and nonsustained atril tachycardia w aberration by heart monitor   . PVC (premature ventricular contraction)   . Rosacea   . Stricture and stenosis of esophagus     Past Surgical History:  Procedure Laterality Date  . BREAST LUMPECTOMY  1998   right breast with radiation  .  CERVICAL DISCECTOMY     C3-5 with bone graft  . GYNECOLOGIC CRYOSURGERY  1980  . IR IMAGING GUIDED PORT INSERTION  12/24/2018  . NECK SURGERY  [phone]   bone graft and fusion  . REFRACTIVE SURGERY     narrow angle glucoma  . RHINOPLASTY  1976  . TUBAL LIGATION  1977    There were no vitals filed for this visit.   Subjective Assessment - 08/25/19 1101    Subjective I had increased pain leverl days ago. I do not know if I am having a fibro attack. My scar felt soft after the soft tissue work.    Patient Stated Goals build endurance, strength, improve balance and bladder issues    Currently in Pain? No/denies    Multiple Pain Sites No                             [facility] Adult PT Treatment/Exercise - 08/25/19 0001      Lumbar Exercises: Aerobic   Nustep SEat # 6, arms #11 level 1, 3 min while assessing patient and monitoring for fatique      Knee/Hip Exercises: Stretches   Other Knee/Hip Stretches doorway stretch hold 15 sec 2 times wiht leg  forward      Knee/Hip Exercises: Standing   Other Standing Knee Exercises stand on rocker board      Knee/Hip Exercises: Seated   Long Arc Quad Strengthening;Right;Left;1 set;15 reps;Weights    Long Arc Quad Weight 3 lbs.    Long [employer] Limitations ball squeeze, slowly      Shoulder Exercises: Seated   Flexion Strengthening;Right;Left;10 reps;Weights    Flexion Weight (lbs) 1    Flexion Limitations wiht ball squeeze    Abduction Strengthening;Both;10 reps;Weights    ABduction Weight (lbs) 1    ABduction Limitations with ball squeeze      Manual Therapy   Manual Therapy Soft tissue mobilization;Myofascial release    Manual therapy comments using the suction cup on the scar and pulling along the lateral abdomen    Soft tissue mobilization abdominal scar mobilization to improve mobility; release of the right iliacus    Myofascial Release along the abdomen to improve tissue release and expand the abdomen with  diaphragmatic breathing                  PT Education - 08/25/19 1147    Education Details Access Code: [email or web address]    Person(s) Educated Patient    Methods Explanation;Demonstration;Verbal cues;Handout    Comprehension Returned demonstration;Verbalized understanding            PT Short Term Goals - 08/25/19 1211      PT SHORT TERM GOAL #1   Title independent with initial exercise program    Time 4    Period Weeks    Status Achieved      PT SHORT TERM GOAL #2   Title understand ways to manage her balance to reduce the risk of falls    Time 4    Period Weeks    Status On-going      PT SHORT TERM GOAL #3   Title Berg balance score >/= 43/56    Time 4    Period Weeks    Status On-going             PT Long Term Goals - 08/08/19 1753      PT LONG TERM GOAL #1   Title independent with advanced HEP    Time 8    Period Weeks    Status New    Target Date 10/03/19      PT LONG TERM GOAL #2   Title Berg balance score >/= 48/56    Time 8    Period Weeks    Status New    Target Date 10/03/19      PT LONG TERM GOAL #3   Title able to go up and down the stairs with railing 4 times in the gym with minimal to no fatique due to increased endurance    Time 8    Period Weeks    Status New    Target Date 10/03/19      PT LONG TERM GOAL #4   Title able to go on an outing with her husband and not having to hold onto her arm due to increased endurance and strength    Time 8    Period Weeks    Status New    Target Date 10/03/19      PT LONG TERM GOAL #5   Title report her fatique has decreased >/= 50% due to increased endurance and strength    Time 8    Period Weeks    Status New  Target Date 10/03/19                 Plan - 08/25/19 1109    Clinical Impression Statement Patient is doig her HEP daily. She has increased tissue mobility of the abdominal scar. She contiues to have urgency. Patient could only do the nustep for 3 minutes due to right hip  pain. Patient has learned arm exercises. Patient will benefit from skilled therapy to imporve strength, endurance, and pelvic floor coordination.    Personal Factors and Comorbidities Comorbidity 3+;Age;Fitness    Comorbidities fibromyalgia; osteoporosis; cervical disectomy with bone graft; peritonial carcionma; robotic assisted laparoscopic total hysterectomy with bilateral salpingoophorectomy    Examination-Activity Limitations Locomotion Level;Bed Mobility;Carry;Stairs;Stand;Lift;Toileting    Examination-Participation Restrictions Cleaning;Meal Prep;Community Activity;Driving;Laundry    Stability/Clinical Decision Making Evolving/Moderate complexity    Rehab Potential Good    PT Frequency 2x / week    PT Duration 8 weeks    PT Treatment/Interventions ADLs/Self Care Home Management;Neuromuscular re-education;Therapeutic exercise;Balance training;Therapeutic activities;Functional mobility training;Stair training;Gait training;Patient/family education;Manual techniques;Energy conservation;Dry needling;Scar mobilization;Taping    PT Next Visit Plan scar massage; nustep, work on pelvic floor externally with releasing the muscles; risk of fall; standing one step    PT Home Exercise Plan Access Code: [email or web address]    Consulted and Agree with Plan of Care Patient           Patient will benefit from skilled therapeutic intervention in order to improve the following deficits and impairments:  Decreased coordination, Difficulty walking, Increased fascial restricitons, Pain, Decreased strength, Decreased mobility, Decreased balance, Decreased scar mobility, Decreased activity tolerance, Decreased endurance  Visit Diagnosis: Muscle weakness (generalized)  Other abnormalities of gait and mobility  Unspecified lack of coordination     Problem List Patient Active Problem List   Diagnosis Date Noted  . Physical debility 06/17/2019  . Tachycardia 05/17/2019  . Rosacea 05/17/2019  . Lesion of lung  05/16/2019  . Genetic testing 05/16/2019  . Mucositis due to chemotherapy 04/25/2019  . Peripheral neuropathy due to chemotherapy ([place]) 03/21/2019  . Goals of care, counseling/discussion 03/21/2019  . Family history of ovarian cancer   . Family history of uterine cancer   . Family history of melanoma   . Family history of prostate cancer   . Family history of pancreatic cancer   . Family history of colon cancer   . Anemia due to antineoplastic chemotherapy 01/18/2019  . Bone pain 12/27/2018  . Glaucoma, narrow-angle   . Peritoneal carcinoma ([place]) 11/25/2018  . LBBB (left bundle branch block) 05/18/2013  . Atrial tachycardia, paroxysmal ([place]) 05/18/2013  . PVC (premature ventricular contraction)   . PAC (premature atrial contraction)   . 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 IBS (irritable bowel syndrome) 11/12/2010  . Bacterial overgrowth syndrome 11/12/2010  . GERD (gastroesophageal reflux disease) 11/12/2010  . PERIPHERAL NEUROPATHY 04/17/2008  . 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 ESOPHAGEAL STRICTURE 04/17/2008  . Diaphragmatic hernia 04/17/2008  . NECK PAIN 04/17/2008    Earlie Counts, PT 08/25/19 12:13 PM   [facility] [address], [place], [place], [address] Phone: [phone]   Fax:  [phone]  Name: [name] [name] MRN: [id number] Date of Birth: 15-Oct-1950

## 2019-09-07 ENCOUNTER — Other Ambulatory Visit: Payer: Self-pay

## 2019-09-07 ENCOUNTER — Ambulatory Visit: Payer: Medicare Other | Admitting: Physical Therapy

## 2019-09-07 ENCOUNTER — Encounter: Payer: Self-pay | Admitting: Physical Therapy

## 2019-09-07 DIAGNOSIS — R2689 Other abnormalities of gait and mobility: Secondary | ICD-10-CM

## 2019-09-07 DIAGNOSIS — M6281 Muscle weakness (generalized): Secondary | ICD-10-CM | POA: Diagnosis not present

## 2019-09-07 DIAGNOSIS — R279 Unspecified lack of coordination: Secondary | ICD-10-CM

## 2019-09-07 NOTE — Patient Instructions (Signed)
Access Code: [email or web address] URL: [email or web address] Date: 09/07/2019 Prepared by: Earlie Counts  Exercises Standing Heel Raise with Support - 1 x daily - 7 x weekly - 2 sets - 10 reps Seated Hamstring Stretch - 1 x daily - 7 x weekly - 1 sets - 1 reps - 30 sec hold Standing Hip Abduction with Counter Support - 1 x daily - 7 x weekly - 2 sets - 10 reps Seated Piriformis Stretch with Trunk Bend - 1 x daily - 7 x weekly - 1 sets - 1 reps - 30 sec hold Standing Hip Extension with Counter Support - 1 x daily - 7 x weekly - 2 sets - 10 reps Standing Gastroc Stretch at Counter - 1 x daily - 7 x weekly - 1 sets - 1 reps - 30 sec hold Seated Long Arc Quad with Ankle Weight - 1 x daily - 7 x weekly - 1 sets - 10 reps Seated Overhead Press with Dumbbells - 1 x daily - 7 x weekly - 10 reps Seated Single Arm Shoulder Flexion with Dumbbells - 1 x daily - 7 x weekly - 1 sets - 10 reps Beginner Bridge - 1 x daily - 7 x weekly - 2 sets - 10 reps Supine Shoulder Flexion with Anchored Resistance - 1 x daily - 7 x weekly - 1 sets - 10 reps Forward Step Up - 1 x daily - 7 x weekly - 1 sets - 10 reps Hooklying Hamstring Stretch with Strap - 1 x daily - 7 x weekly - 1 sets - 2 reps - 30 sec hold Supine Piriformis Stretch Pulling Heel to Hip - 1 x daily - 7 x weekly - 1 sets - 2 reps - 30 sec hold  Patient Education How to Prevent Falls What You Can Do to Prevent [facility] [address] [place], [place] [address] Phone # [phone] Fax [phone]

## 2019-09-07 NOTE — Therapy (Signed)
[facility] [address], [place], [place], [address] Phone: [phone]   Fax:  [phone]  Physical Therapy Treatment  Patient Details  Name: [name] [name] MRN: [id number] Date of Birth: 1950/03/24 Referring Provider (PT): Dr. [name]   Encounter Date: 09/07/2019   PT End of Session - 09/07/19 1535    Visit Number 4    Date for PT Re-Evaluation 10/03/19    Authorization Type Medicare BCBS    PT Start Time 3818    PT Stop Time 2993    PT Time Calculation (min) 45 min    Activity Tolerance Patient tolerated treatment well;No increased pain    Behavior During Therapy WFL for tasks assessed/performed           Past Medical History:  Diagnosis Date  . Allergic rhinitis   . Cervical dysplasia 1980  . Cervicalgia   . Eczema    Rosacea,dermatitis-Dr [employer]  . Esophageal reflux   . Family history of colon cancer   . Family history of melanoma   . Family history of ovarian cancer   . Family history of pancreatic cancer   . Family history of prostate cancer   . Family history of uterine cancer   . Fibromyalgia   . GERD (gastroesophageal reflux disease)   . Glaucoma, narrow-angle   . Heart murmur   . Hiatal hernia   . Irritable bowel syndrome   . LBBB (left bundle branch block)   . Left bundle branch block   . 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 Mitral valve disorders(424.0)    No antibiotics required  . Osteoporosis 12/2016   T score -2.3, 2014 T score -2.5 AP spine  . PAC (premature atrial contraction) 2012   ,PVC's, and nonsustained atril tachycardia w aberration by heart monitor   . PVC (premature ventricular contraction)   . Rosacea   . Stricture and stenosis of esophagus     Past Surgical History:  Procedure Laterality Date  . BREAST LUMPECTOMY  1998   right breast with radiation  .  CERVICAL DISCECTOMY     C3-5 with bone graft  . GYNECOLOGIC CRYOSURGERY  1980  . IR IMAGING GUIDED PORT INSERTION  12/24/2018  . NECK SURGERY  [phone]   bone graft and fusion  . REFRACTIVE SURGERY     narrow angle glucoma  . RHINOPLASTY  1976  . TUBAL LIGATION  1977    There were no vitals filed for this visit.   Subjective Assessment - 09/07/19 1447    Subjective I went to the beach last week. I am doing my exercises daily. I have good and low endurance days. Patient reports the pain in the abdominal scar is 40% better.    Patient Stated Goals build endurance, strength, improve balance and bladder issues    Currently in Pain? No/denies                             [facility] Adult PT Treatment/Exercise - 09/07/19 0001      Lumbar Exercises: Aerobic   Nustep SEat # 6, arms #11 level 1, 5 min while assessing patient and monitoring for fatique      Lumbar Exercises: Supine   Bridge 15 reps    Bridge Limitations red band across the hips  Other Supine Lumbar Exercises hookly alternate shoulder flexion with yellow band 10x each      Knee/Hip Exercises: Stretches   Active Hamstring Stretch Right;Left;1 rep;30 seconds    Active Hamstring Stretch Limitations supine with strap    Piriformis Stretch Right;Left;1 rep;30 seconds    Piriformis Stretch Limitations supine      Knee/Hip Exercises: Standing   Forward Step Up Right;Left;1 set;10 reps    Forward Step Up Limitations trying to not hold on      Manual Therapy   Manual Therapy Soft tissue mobilization;Myofascial release    Manual therapy comments using the suction cup on the scar and pulling along the lateral abdomen    Soft tissue mobilization abdominal scar mobilization to improve mobility; release of the right iliacus    Myofascial Release along the lower abdomen to release around the bladder                  PT Education - 09/07/19 1532    Education Details Access Code: [email or web address]; ways to pervent  falls    Person(s) Educated Patient    Methods Explanation;Demonstration;Verbal cues;Handout    Comprehension Returned demonstration;Verbalized understanding            PT Short Term Goals - 09/07/19 1539      PT SHORT TERM GOAL #1   Title independent with initial exercise program    Time 4    Period Weeks    Status Achieved      PT SHORT TERM GOAL #2   Title understand ways to manage her balance to reduce the risk of falls    Time 4    Period Weeks    Status Achieved      PT SHORT TERM GOAL #3   Title Berg balance score >/= 43/56    Time 4    Period Weeks    Status On-going             PT Long Term Goals - 08/08/19 1753      PT LONG TERM GOAL #1   Title independent with advanced HEP    Time 8    Period Weeks    Status New    Target Date 10/03/19      PT LONG TERM GOAL #2   Title Berg balance score >/= 48/56    Time 8    Period Weeks    Status New    Target Date 10/03/19      PT LONG TERM GOAL #3   Title able to go up and down the stairs with railing 4 times in the gym with minimal to no fatique due to increased endurance    Time 8    Period Weeks    Status New    Target Date 10/03/19      PT LONG TERM GOAL #4   Title able to go on an outing with her husband and not having to hold onto her arm due to increased endurance and strength    Time 8    Period Weeks    Status New    Target Date 10/03/19      PT LONG TERM GOAL #5   Title report her fatique has decreased >/= 50% due to increased endurance and strength    Time 8    Period Weeks    Status New    Target Date 10/03/19                 Plan -  09/07/19 1503    Clinical Impression Statement Patient has increased movement of the abdominal scar and  feels softer. Patient has learned more exericses for her core and extremity strength. Patient has been doing her exercises daily. Patient was able to walk on the beach last week but her balanced was challenged when in the surf. Paitent was able  to go up and down the stairs at the beach with greater ease. Patient reports some days she has better endurance than others. Patient has information on how to prevent falls that she will read over.  Patient will benefit from skilled  therapy to improve strenght endurance, and pelvic floor coordination.    Personal Factors and Comorbidities Comorbidity 3+;Age;Fitness    Comorbidities fibromyalgia; osteoporosis; cervical disectomy with bone graft; peritonial carcionma; robotic assisted laparoscopic total hysterectomy with bilateral salpingoophorectomy    Examination-Activity Limitations Locomotion Level;Bed Mobility;Carry;Stairs;Stand;Lift;Toileting    Examination-Participation Restrictions Cleaning;Meal Prep;Community Activity;Driving;Laundry    Stability/Clinical Decision Making Evolving/Moderate complexity    Rehab Potential Good    PT Frequency 2x / week    PT Duration 8 weeks    PT Treatment/Interventions ADLs/Self Care Home Management;Neuromuscular re-education;Therapeutic exercise;Balance training;Therapeutic activities;Functional mobility training;Stair training;Gait training;Patient/family education;Manual techniques;Energy conservation;Dry needling;Scar mobilization;Taping    PT Next Visit Plan see how appointment with Dr. Denman George went, see how long she is able to wait for urination, progress exercise, manual work on scar; check Berg balance    PT Home Exercise Plan Access Code: [email or web address]    Consulted and Agree with Plan of Care Patient           Patient will benefit from skilled therapeutic intervention in order to improve the following deficits and impairments:  Decreased coordination, Difficulty walking, Increased fascial restricitons, Pain, Decreased strength, Decreased mobility, Decreased balance, Decreased scar mobility, Decreased activity tolerance, Decreased endurance  Visit Diagnosis: Muscle weakness (generalized)  Other abnormalities of gait and mobility  Unspecified lack of  coordination     Problem List Patient Active Problem List   Diagnosis Date Noted  . Physical debility 06/17/2019  . Tachycardia 05/17/2019  . Rosacea 05/17/2019  . Lesion of lung 05/16/2019  . Genetic testing 05/16/2019  . Mucositis due to chemotherapy 04/25/2019  . Peripheral neuropathy due to chemotherapy ([place]) 03/21/2019  . Goals of care, counseling/discussion 03/21/2019  . Family history of ovarian cancer   . Family history of uterine cancer   . Family history of melanoma   . Family history of prostate cancer   . Family history of pancreatic cancer   . Family history of colon cancer   . Anemia due to antineoplastic chemotherapy 01/18/2019  . Bone pain 12/27/2018  . Glaucoma, narrow-angle   . Peritoneal carcinoma ([place]) 11/25/2018  . LBBB (left bundle branch block) 05/18/2013  . Atrial tachycardia, paroxysmal ([place]) 05/18/2013  . PVC (premature ventricular contraction)   . PAC (premature atrial contraction)   . 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 IBS (irritable bowel syndrome) 11/12/2010  . Bacterial overgrowth syndrome 11/12/2010  . GERD (gastroesophageal reflux disease) 11/12/2010  . PERIPHERAL NEUROPATHY 04/17/2008  . 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 ESOPHAGEAL STRICTURE 04/17/2008  . Diaphragmatic hernia 04/17/2008  . NECK PAIN 04/17/2008    Earlie Counts, PT 09/07/19 3:40 PM   [facility] [address], [place], [place], [address] Phone: [phone]   Fax:  [phone]  Name: NKECHI LINEHAN MRN: [id number] Date of Birth: 1950-12-04

## 2019-09-08 ENCOUNTER — Encounter: Payer: Medicare Other | Admitting: Obstetrics and Gynecology

## 2019-09-08 ENCOUNTER — Encounter: Payer: Medicare Other | Admitting: Gynecology

## 2019-09-09 ENCOUNTER — Inpatient Hospital Stay: Payer: Medicare Other | Attending: Gynecologic Oncology

## 2019-09-09 ENCOUNTER — Inpatient Hospital Stay: Payer: Medicare Other

## 2019-09-09 ENCOUNTER — Other Ambulatory Visit: Payer: Self-pay

## 2019-09-09 DIAGNOSIS — Z923 Personal history of irradiation: Secondary | ICD-10-CM | POA: Diagnosis not present

## 2019-09-09 DIAGNOSIS — Z853 Personal history of malignant neoplasm of breast: Secondary | ICD-10-CM | POA: Insufficient documentation

## 2019-09-09 DIAGNOSIS — R12 Heartburn: Secondary | ICD-10-CM | POA: Diagnosis not present

## 2019-09-09 DIAGNOSIS — R630 Anorexia: Secondary | ICD-10-CM | POA: Diagnosis not present

## 2019-09-09 DIAGNOSIS — C482 Malignant neoplasm of peritoneum, unspecified: Secondary | ICD-10-CM | POA: Diagnosis present

## 2019-09-09 DIAGNOSIS — Z800 Family history of malignant neoplasm of digestive organs: Secondary | ICD-10-CM | POA: Diagnosis not present

## 2019-09-09 DIAGNOSIS — R140 Abdominal distension (gaseous): Secondary | ICD-10-CM | POA: Insufficient documentation

## 2019-09-09 DIAGNOSIS — Z9221 Personal history of antineoplastic chemotherapy: Secondary | ICD-10-CM | POA: Diagnosis not present

## 2019-09-09 DIAGNOSIS — R110 Nausea: Secondary | ICD-10-CM | POA: Insufficient documentation

## 2019-09-09 DIAGNOSIS — Z8041 Family history of malignant neoplasm of ovary: Secondary | ICD-10-CM | POA: Insufficient documentation

## 2019-09-09 DIAGNOSIS — M797 Fibromyalgia: Secondary | ICD-10-CM | POA: Diagnosis not present

## 2019-09-09 LAB — CBC WITH DIFFERENTIAL (CANCER CENTER ONLY)
Abs Immature Granulocytes: 0.01 10*3/uL (ref 0.00–0.07)
Basophils Absolute: 0 10*3/uL (ref 0.0–0.1)
Basophils Relative: 1 %
Eosinophils Absolute: 0.1 10*3/uL (ref 0.0–0.5)
Eosinophils Relative: 1 %
HCT: 35.8 % — ABNORMAL LOW (ref 36.0–46.0)
Hemoglobin: 11.6 g/dL — ABNORMAL LOW (ref 12.0–15.0)
Immature Granulocytes: 0 %
Lymphocytes Relative: 27 %
Lymphs Abs: 2 10*3/uL (ref 0.7–4.0)
MCH: 30.1 pg (ref 26.0–34.0)
MCHC: 32.4 g/dL (ref 30.0–36.0)
MCV: 92.7 fL (ref 80.0–100.0)
Monocytes Absolute: 0.6 10*3/uL (ref 0.1–1.0)
Monocytes Relative: 8 %
Neutro Abs: 4.9 10*3/uL (ref 1.7–7.7)
Neutrophils Relative %: 63 %
Platelet Count: 215 10*3/uL (ref 150–400)
RBC: 3.86 MIL/uL — ABNORMAL LOW (ref 3.87–5.11)
RDW: 14.1 % (ref 11.5–15.5)
WBC Count: 7.7 10*3/uL (ref 4.0–10.5)
nRBC: 0 % (ref 0.0–0.2)

## 2019-09-09 LAB — CMP (CANCER CENTER ONLY)
ALT: 20 U/L (ref 0–44)
AST: 22 U/L (ref 15–41)
Albumin: 4.3 g/dL (ref 3.5–5.0)
Alkaline Phosphatase: 62 U/L (ref 38–126)
Anion gap: 11 (ref 5–15)
BUN: 10 mg/dL (ref 8–23)
CO2: 25 mmol/L (ref 22–32)
Calcium: 9.3 mg/dL (ref 8.9–10.3)
Chloride: 105 mmol/L (ref 98–111)
Creatinine: 0.64 mg/dL (ref 0.44–1.00)
GFR, Est AFR Am: >60 mL/min (ref >60)
GFR, Estimated: >60 mL/min (ref >60)
Glucose, Bld: 84 mg/dL (ref 70–99)
Potassium: 3.7 mmol/L (ref 3.5–5.1)
Sodium: 141 mmol/L (ref 135–145)
Total Bilirubin: 0.3 mg/dL (ref 0.3–1.2)
Total Protein: 7.4 g/dL (ref 6.5–8.1)

## 2019-09-09 MED ORDER — SODIUM CHLORIDE 0.9% FLUSH
10.0000 mL | Freq: Once | INTRAVENOUS | Status: AC
  Administered 2019-09-09: 10 mL
  Filled 2019-09-09: qty 10

## 2019-09-09 MED ORDER — HEPARIN SOD (PORK) LOCK FLUSH 100 UNIT/ML IV SOLN
500.0000 [IU] | Freq: Once | INTRAVENOUS | Status: AC
  Administered 2019-09-09: 500 [IU]
  Filled 2019-09-09: qty 5

## 2019-09-10 LAB — CA 125: Cancer Antigen (CA) 125: 7.4 U/mL (ref 0.0–38.1)

## 2019-09-12 ENCOUNTER — Telehealth: Payer: Self-pay | Admitting: *Deleted

## 2019-09-12 NOTE — Telephone Encounter (Signed)
Per Dr.Gorsuch, called to make pt aware that labs are stable and to f/u as scheduled. Pt verbalized understanding

## 2019-09-14 ENCOUNTER — Other Ambulatory Visit: Payer: Self-pay

## 2019-09-14 ENCOUNTER — Inpatient Hospital Stay (HOSPITAL_BASED_OUTPATIENT_CLINIC_OR_DEPARTMENT_OTHER): Payer: Medicare Other | Admitting: Gynecologic Oncology

## 2019-09-14 ENCOUNTER — Encounter: Payer: Self-pay | Admitting: Gynecologic Oncology

## 2019-09-14 VITALS — BP 131/62 | HR 85 | Temp 98.9°F | Resp 18 | Ht 65.0 in | Wt 104.8 lb

## 2019-09-14 DIAGNOSIS — C482 Malignant neoplasm of peritoneum, unspecified: Secondary | ICD-10-CM

## 2019-09-14 DIAGNOSIS — Z9221 Personal history of antineoplastic chemotherapy: Secondary | ICD-10-CM

## 2019-09-14 NOTE — Progress Notes (Signed)
Follow-up Note: Gyn-Onc  Consult was initially requested by Dr. Olen Pel for the evaluation of [name] [name] 69 y.o. female  Chief Complaint  Patient presents with  . Peritoneal carcinoma ([place])    Follow Up    Assessment/Plan:  [name] [name] is a 69 y.o. 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 Therapy completed April, 2021. Complete clinical response.  She desires annual speculum examination due to her sister's history of vulvar melanoma.  She will see Dr Alvy Bimler in 108month with CA 125 and myself in 6 months.   HPI: [name] [name] a 69y.o.   nulliparous female who reported menopause in her late 51s  She reported a 6-year history of fibromyalgia.  She was in her usual state of health until the spring/summer of 2020 when she noticed increased abdominal girth and some dyspnea upon exertion.  After Friday, September 4 she noted poor appetite and a rather significant increase in the abdominal distention.  She reported heartburn and intermittent nausea.  There had been no rectal or vaginal bleeding but she did report intermittent diarrhea.  [name] GLANTZwas evaluated  in the ED for the complaint of abdominal distention on November 23, 2018.  A CT scan was notable for a slightly irregular liver contour.  Significant perihepatic ascites.  There was vague ill-defined enhancing appearing peritoneal lesions in the mesentery worrisome for peritoneal carcinomatosis.  The uterus and left ovary appeared normal but there was an ill-defined irregular enhancing soft tissue noted in the region of the right adnexa without discrete masses.  Large volume abdominal pelvic ascites was noted.  Ca1 25 was collected and returned at a value of 253.  [name] [name] personal history is notable for a right-sided in situ breast cancer diagnosed in 1998 treated with radiation therapy  without any further interim maintenance treatment.  Her most recent mammogram was in June 2020 and was  within normal limits, her  last colonoscopy was in October 2014 and also was  within normal limits  [name] [name]  family history is notable for a sister diagnosed with multiple primaries ovarian cancer the age of 674and vulva melanoma at the age of 614  A second sister was diagnosed with melanoma in situ.  A paternal uncle was diagnosed with multiple primaries namely pancreatic cancer multiple myeloma and melanoma.   She underwent [place] paracentesis on 11/25/18 which showed metastatic adenocarcinoma with the immunostains favoring a gynecologic primary.  She was diagnosed with stage 3C cancer and was recommended neoadjuvant chemotherapy for 3 cycles followed by interval cytoreductive surgery.   Chemotherapy with carboplatin and paclitaxel began on 12/06/2018.  Day 1 of cycle 3 was administered on 01/17/2019.   CA 125 on 01/17/19 was 57.2.  CT abd/pelvis on 01/31/19 showed significant decrease in peritoneal carcinomatosis since previous study. Interval resolution of ascites. Focal area of parenchymal consolidation in central right middle lobe, which measures 3 cm. Differential diagnosis includes infectious or inflammatory process, atelectasis, and neoplasm.   On February 17, 2019 she underwent robotic assisted total hysterectomy with BSO, omentectomy, radical tumor debulking, mini laparotomy for omentectomy. Intraoperative findings were significant for a grossly normal uterus, normal ovaries, few scattered peritoneal nodules 1 mm on the serosa of the uterus and tubes.  The omentum and the gastrocolic region was tethered to the mesentery with tumor.  Surgery included a complete optimal resection with  no gross residual disease at the end of the procedure. Surgery was uncomplicated.  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  There were metastatic implants on the serosa of the uterus, ovaries, and fallopian tubes in addition to metastatic foci greater than 2 cm in the omentum.  Interval Hx She received 3 additional cycles of carboplatin and paclitaxel completed in March, 2021.  CT imaging on 06/16/19 showed complete clinical response (stable pulmonary nodule).  CA 125 normalized to 9.1 on 06/16/19 CA 125 remained normal at 7.4 on 09/09/19.  She reported symptoms of abdominal bloating.   Review of Systems: Constitutional  Feels well,  Cardiovascular  No chest pain, shortness of breath, or edema  Pulmonary  No cough or wheeze.  Gastro Intestinal  + abdominal bloating/perception of being full Genito Urinary  No frequency, urgency, dysuria, no vaginal bleeding musculo Skeletal  Intermittent myalgia, arthralgia, joint swelling and pain with flareups of fibromyalgia neurologic , + neuropathy toes No weakness, numbness, change in gait,  Psychology  No depression, anxiety, insomnia.    Current Meds:  Outpatient Encounter Medications as of 09/14/2019  Medication Sig  . acetaminophen (TYLENOL) 500 MG tablet Take 500 mg by mouth every 6 (six) hours as needed for moderate pain or headache.  . ARTIFICIAL TEAR SOLUTION OP Place 1 drop into both eyes daily as needed (dry eyes).  . Azelaic Acid (FINACEA) 15 % cream Apply 1 application topically daily.  . cholecalciferol (VITAMIN D3) 25 MCG (1000 UNIT) tablet Take 2,000 Units by mouth daily.  . cromolyn (NASALCROM) 5.2 MG/ACT nasal spray Place 1 spray into the nose as needed for allergies.   Marland Kitchen gabapentin (NEURONTIN) 300 MG capsule Take 600 mg by mouth 3 (three) times daily.   Marland Kitchen guaiFENesin (MUCINEX) 600 MG 12 hr tablet Take 600 mg by mouth 2 (two) times daily as needed (congestion).  . Ketotifen Fumarate (ALLERGY EYE DROPS OP) Place 1 drop into both eyes daily as needed (allergies).  Marland Kitchen  lidocaine-prilocaine (EMLA) cream Apply to affected area once (Patient taking differently: Apply 1 application topically daily as needed (port access). )  . loratadine (CLARITIN) 5 MG/5ML syrup Take 5 mg by mouth daily as needed for allergies (bone pain related to chemo).  . mupirocin ointment (BACTROBAN) 2 % Apply 1 application topically as needed. (Patient taking differently: Apply 1 application topically as needed (wound care). )  . pantoprazole (PROTONIX) 40 MG tablet Take 1 tablet (40 mg total) by mouth daily.  . Risedronate Sodium 35 MG TBEC Take 1 tablet (35 mg total) by mouth once a week. (Patient taking differently: Take 1 tablet by mouth every Tuesday. )  . Sulfacetamide Sodium, Acne, 10 % LOTN Apply 1 application topically at bedtime.  . vitamin E 1000 UNIT capsule Take 1,800 Units by mouth daily.  . [DISCONTINUED] albuterol (VENTOLIN HFA) 108 (90 Base) MCG/ACT inhaler Inhale 2 puffs into the lungs every 6 (six) hours as needed for wheezing or shortness of breath. (Patient not taking: Reported on 09/14/2019)  . [DISCONTINUED] ondansetron (ZOFRAN) 8 MG tablet Take 1 tablet (8 mg total) by mouth every 8 (eight) hours as needed for refractory nausea / vomiting. (Patient not taking: Reported on 09/14/2019)  . [DISCONTINUED] prochlorperazine (COMPAZINE) 10 MG tablet Take 1 tablet (10 mg total) by mouth every 6 (six) hours as needed (Nausea or vomiting). (Patient not taking:  Reported on 09/14/2019)   No facility-administered encounter medications on file as of 09/14/2019.    Allergy:  Allergies  Allergen Reactions  . Fentanyl Palpitations    Palpitations , fever ,headahe, N/V  " loud pounding heart beat"   . Milk-Related Compounds Diarrhea and Nausea And Vomiting  . Oxybutynin Chloride Other (See Comments)    Ditropan  Pt doesn't remember   . Thimerosal Other (See Comments)    Pt doesn't remember  . Tramadol Other (See Comments)    Pt doesn't remember  . Avelox [Moxifloxacin Hcl In Nacl]  Rash  . Doxycycline Rash  . Erythromycin Rash  . Metronidazole Rash  . Minocycline Rash  . Penicillins Rash    Did it involve swelling of the face/tongue/throat, SOB, or low BP? No Did it involve sudden or severe rash/hives, skin peeling, or any reaction on the inside of your mouth or nose? No Did you need to seek medical attention at a hospital or doctor's office? No When did it last happen?Childhood allergy If all above answers are "NO", may proceed with cephalosporin use.   . Quinolones Rash  . Sulfonamide Derivatives Rash    Social Hx:   Social History   Socioeconomic History  . Marital status: Married    Spouse name: Antony Haste  . Number of children: 0  . Years of education: Not on file  . Highest education level: Not on file  Occupational History  . Occupation: Teacher, early years/pre: [employer]  Tobacco Use  . Smoking status: Never Smoker  . Smokeless tobacco: Never Used  Vaping Use  . Vaping Use: Never used  Substance and Sexual Activity  . Alcohol use: Yes    Alcohol/week: 2.0 standard drinks    Types: 2 Standard drinks or equivalent per week    Comment: Social   . Drug use: No  . Sexual activity: Not Currently    Birth control/protection: Post-menopausal    Comment: 1st intercourse 69 yo- More than 5 partners  Other Topics Concern  . Not on file  Social History Narrative  . Not on file   Social Determinants of Health   Financial Resource Strain:   . Difficulty of Paying Living Expenses:   Food Insecurity:   . Worried About Charity fundraiser in the Last Year:   . Arboriculturist in the Last Year:   Transportation Needs:   . Film/video editor (Medical):   Marland Kitchen Lack of Transportation (Non-Medical):   Physical Activity:   . Days of Exercise per Week:   . Minutes of Exercise per Session:   Stress:   . Feeling of Stress :   Social Connections:   . Frequency of Communication with Friends and Family:   . Frequency of Social Gatherings  with Friends and Family:   . Attends Religious Services:   . Active Member of Clubs or Organizations:   . Attends Archivist Meetings:   Marland Kitchen Marital Status:   Intimate Partner Violence:   . Fear of Current or Ex-Partner:   . Emotionally Abused:   Marland Kitchen Physically Abused:   . Sexually Abused:     Past Surgical Hx:  Past Surgical History:  Procedure Laterality Date  . BREAST LUMPECTOMY  1998   right breast with radiation  . CERVICAL DISCECTOMY     C3-5 with bone graft  . GYNECOLOGIC CRYOSURGERY  1980  . IR IMAGING GUIDED PORT INSERTION  12/24/2018  . NECK SURGERY  [phone]  bone graft and fusion  . REFRACTIVE SURGERY     narrow angle glucoma  . RHINOPLASTY  1976  . TUBAL LIGATION  1977    Past Medical Hx:  Past Medical History:  Diagnosis Date  . Allergic rhinitis   . Cervical dysplasia 1980  . Cervicalgia   . Eczema    Rosacea,dermatitis-Dr [employer]  . Esophageal reflux   . Family history of colon cancer   . Family history of melanoma   . Family history of ovarian cancer   . Family history of pancreatic cancer   . Family history of prostate cancer   . Family history of uterine cancer   . Fibromyalgia   . GERD (gastroesophageal reflux disease)   . Glaucoma, narrow-angle   . Heart murmur   . Hiatal hernia   . Irritable bowel syndrome   . LBBB (left bundle branch block)   . Left bundle branch block   . 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 Mitral valve disorders(424.0)    No antibiotics required  . Osteoporosis 12/2016   T score -2.3, 2014 T score -2.5 AP spine  . PAC (premature atrial contraction) 2012   ,PVC's, and nonsustained atril tachycardia w aberration by heart monitor   . PVC (premature ventricular contraction)   . Rosacea   . Stricture and stenosis of esophagus     Past Gynecological History: Menarche at age 64 menopause late 49s nulliparous  bilateral tubal ligation in her 36s.  History of abnormal Pap test treated with cryotherapy negative Pap test since  Family Hx:  Family History  Problem Relation Age of Onset  . Heart disease Mother   . Depression Mother   . Hypertension Mother   . Heart disease Father   . Hypertension Father   . Aneurysm Father   . Uterine cancer Sister 56  . Hypertension Sister   . Ovarian cancer Sister 32  . Melanoma Sister 28  . Depression Sister   . Melanoma Sister 40  . Heart disease Paternal Grandfather   . Stroke Maternal Grandmother   . Cancer Maternal Grandfather 35       Pancreatic  . Stroke Paternal Grandmother   . Cancer Paternal Uncle        multiple myeloma, prostate cancer and melanoma (separate primaries)  . Colon cancer Neg Hx   Sister #1 ovarian cancer diagnosed at age 70 vulvar melanoma diagnosed at age 65 Sister #2 melanoma in situ Sister #3 cervical cancer Paternal uncle pancreatic cancer multiple myeloma and melanoma  Vitals:  Blood pressure 131/62, pulse 85, temperature 98.9 F (37.2 C), temperature source Oral, resp. rate 18, height '5\' 5"'$  (1.651 m), weight 104 lb 12.8 oz (47.5 kg), SpO2 100 %.  Physical Exam: WD in NAD Neck  Supple NROM, without any enlargements.  Lymph Node Survey No cervical supraclavicular or inguinal adenopathy Cardiovascular  Pulse normal rate, regularity and rhythm.  Lungs  Clear to auscultation bilaterally, breath sounds present at the bases .  Skin  No rash/lesions/breakdown  Psychiatry  Alert and oriented appropriate mood affect speech and reasoning. Abdomen  Normoactive bowel sounds, abdomen soft, non-tender, no masses or distension. Incisions well-healed Back No CVA tenderness Genito Urinary  Vaginal cuff is well healed. Speculum exam reveals no vaginal lesions.  Rectal  deferred Extremities  No bilateral cyanosis, clubbing or edema.  Thereasa Solo, MD 09/14/2019,  3:10 PM

## 2019-09-15 ENCOUNTER — Ambulatory Visit: Payer: Medicare Other | Attending: Hematology and Oncology | Admitting: Physical Therapy

## 2019-09-15 ENCOUNTER — Encounter: Payer: Self-pay | Admitting: Physical Therapy

## 2019-09-15 DIAGNOSIS — M6281 Muscle weakness (generalized): Secondary | ICD-10-CM | POA: Diagnosis present

## 2019-09-15 DIAGNOSIS — R279 Unspecified lack of coordination: Secondary | ICD-10-CM

## 2019-09-15 DIAGNOSIS — R2689 Other abnormalities of gait and mobility: Secondary | ICD-10-CM

## 2019-09-15 NOTE — Patient Instructions (Signed)
Moisturizers . They are used in the vagina to hydrate the mucous membrane that make up the vaginal canal. . Designed to keep a more normal acid balance (ph) . Once placed in the vagina, it will last between two to three days.  . Use 2-3 times per week at bedtime  . Ingredients to avoid is glycerin and fragrance, can increase chance of infection . Should not be used just before sex due to causing irritation . Most are gels administered either in a tampon-shaped applicator or as a vaginal suppository. They are non-hormonal.   Types of Moisturizers  . Vitamin E vaginal suppositories- Whole foods, Amazon . Moist Again . Coconut oil- can break down condoms . Julva- (Do no use if on Tamoxifen) amazon . Yes moisturizer- amazon . NeuEve Silk , NeuEve Silver for menopausal or over 65 (if have severe vaginal atrophy or cancer treatments use NeuEve Silk for  1 month than move to [employer])- [employer], MapleFlower.dk . Olive and Bee intimate cream- www.oliveandbee.com.au . Mae vaginal [place] . Aloe .    Creams to use externally on the Vulva area  Albertson's (good for for cancer patients that had radiation to the area)- [place] or [employer].FlyingBasics.com.br  V-magic cream - amazon  Julva-amazon  Vital "V [place] salve ( help moisturize and help with thinning vulvar area, does have [place] by Irwin Brakeman labial moisturizer ([place],   Coconut or olive oil, vitamin E oil  aloe [facility] [address], [place], [place] [address] Phone # [phone] Fax [phone]   Things to avoid in the vaginal area . Do not use things to irritate the vulvar area . No lotions just specialized creams for the vulva area- Neogyn, V-magic, No soaps; can use Aveeno or Calendula cleanser if needed. Must be gentle . No deodorants . No douches . Good to sleep without underwear to let  the vaginal area to air out . No scrubbing: spread the lips to let warm water rinse over labias and pat dry

## 2019-09-15 NOTE — Therapy (Signed)
[facility] [address] [place], [place], [address] Phone: [phone]   Fax:  [phone]  Physical Therapy Treatment  Patient Details  Name: [name] [name] MRN: [id number] Date of Birth: [birth year] Referring Provider (PT): Dr. [name]   Encounter Date: 09/15/2019   PT End of Session - 09/15/19 1101    Visit Number 5    Date for PT Re-Evaluation 10/03/19    Authorization Type Medicare BCBS    PT Start Time 7341    PT Stop Time 1055    PT Time Calculation (min) 40 min    Activity Tolerance Patient tolerated treatment well;No increased pain    Behavior During Therapy WFL for tasks assessed/performed           Past Medical History:  Diagnosis Date  . Allergic rhinitis   . Cervical dysplasia 1980  . Cervicalgia   . Eczema    Rosacea,dermatitis-Dr [employer]  . Esophageal reflux   . Family history of colon cancer   . Family history of melanoma   . Family history of ovarian cancer   . Family history of pancreatic cancer   . Family history of prostate cancer   . Family history of uterine cancer   . Fibromyalgia   . GERD (gastroesophageal reflux disease)   . Glaucoma, narrow-angle   . Heart murmur   . Hiatal hernia   . Irritable bowel syndrome   . LBBB (left bundle branch block)   . Left bundle branch block   . 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 Mitral valve disorders(424.0)    No antibiotics required  . Osteoporosis 12/2016   T score -2.3, 2014 T score -2.5 AP spine  . PAC (premature atrial contraction) 2012   ,PVC's, and nonsustained atril tachycardia w aberration by heart monitor   . PVC (premature ventricular contraction)   . Rosacea   . Stricture and stenosis of esophagus     Past Surgical History:  Procedure Laterality Date  . BREAST LUMPECTOMY  1998   right breast with radiation  .  CERVICAL DISCECTOMY     C3-7 with bone graft, in three different surgeries, have titanium plate and screws  . GYNECOLOGIC CRYOSURGERY  1980  . IR IMAGING GUIDED PORT INSERTION  12/24/2018  . NECK SURGERY  [phone]   bone graft and fusion  . REFRACTIVE SURGERY     narrow angle glucoma  . RHINOPLASTY  1976  . TUBAL LIGATION  1977    There were no vitals filed for this visit.   Subjective Assessment - 09/15/19 1022    Subjective there is one exercise that I need to go over. I filled out bladder log. Dr. Denman George says the therapist can do anything to the perineal area to work on pelvic floor.    Patient Stated Goals build endurance, strength, improve balance and bladder issues              [facility] PT Assessment - 09/15/19 1025      Assessment   Medical Diagnosis C48.2 Peritoneal carcinoma: R53.81 Physical debility    Referring Provider (PT) Dr. [name]    Onset Date/Surgical Date 11/25/18    Prior Therapy none      Precautions   Precautions Other (comment)    Precaution Comments cancer; osteoporosis      Restrictions   Weight  Bearing Restrictions No      [place] residence      Prior Function   Level of [place] Retired      Associate Professor   Overall Cognitive Status Within Functional Limits for tasks assessed      [place] to [place] to stand without using hands and stabilize independently    Standing Unsupported Able to stand 2 minutes with supervision    Sitting with Back Unsupported but Feet Supported on Floor or Stool Able to sit safely and securely 2 minutes    Stand to Sit Controls descent by using hands    Transfers Able to transfer safely, definite need of hands    Standing Unsupported with Eyes Closed Able to stand 10 seconds with supervision    Standing Unsupported with Feet Together Able to place feet together independently but unable to hold for 30 seconds    From Standing,  Reach Forward with Outstretched Arm Can reach forward >12 cm safely (5")    From Standing Position, Pick up Object from Reader to pick up shoe safely and easily    From Standing Position, Turn to Look Behind Over each Shoulder Looks behind from both sides and weight shifts well    Turn 360 Degrees Able to turn 360 degrees safely in 4 seconds or less    Standing Unsupported, Alternately Place Feet on Step/Stool Able to stand independently and safely and complete 8 steps in 20 seconds    Standing Unsupported, One Foot in [place] to take small step independently and hold 30 seconds    Standing on One Leg Able to lift leg independently and hold equal to or more than 3 seconds    Total Score 45    Berg comment: 80% chance of falling                         [facility] Adult PT Treatment/Exercise - 09/15/19 0001      Self-Care   Self-Care Other Self-Care Comments    Other Self-Care Comments  instructed patient on vaginal moisturizers to reduce her dryness; looked over the bladder diary and see she goes to the bathroom every 1.5 hours to 2 hours and sometimes can wait 3 hours;  instructed patient on the urge to void and she was able to domenstrate back      Lumbar Exercises: Aerobic   Stationary Bike level 1 for 6 minutes                  PT Education - 09/15/19 1101    Education Details urge to boid, vaginal moisturizers    Person(s) Educated Patient    Methods Explanation;Handout    Comprehension Verbalized understanding            PT Short Term Goals - 09/15/19 1059      PT SHORT TERM GOAL #3   Title Berg balance score >/= 43/56    Time 4    Period Weeks    Status Achieved    Target Date 10/03/19             PT Long Term Goals - 08/08/19 1753      PT LONG TERM GOAL #1   Title independent with advanced HEP    Time 8    Period Weeks    Status New    Target Date 10/03/19  PT LONG TERM GOAL #2   Title Berg balance score >/= 48/56    Time 8     Period Weeks    Status New    Target Date 10/03/19      PT LONG TERM GOAL #3   Title able to go up and down the stairs with railing 4 times in the gym with minimal to no fatique due to increased endurance    Time 8    Period Weeks    Status New    Target Date 10/03/19      PT LONG TERM GOAL #4   Title able to go on an outing with her husband and not having to hold onto her arm due to increased endurance and strength    Time 8    Period Weeks    Status New    Target Date 10/03/19      PT LONG TERM GOAL #5   Title report her fatique has decreased >/= 50% due to increased endurance and strength    Time 8    Period Weeks    Status New    Target Date 10/03/19                 Plan - 09/15/19 1052    Clinical Impression Statement Patient is able to not run to the bathroom when going to the bathroom. She will go to the bathroom every 1.5 to 3 hours. She has learned how to do the urge to void to delay the urge and wait 2 hours before going to the bathroom. Berg Balance has improved to 45/56. Patient has most trouble with single leg and tandem stance. Patient will benefit from skilled therapy to improve strength, endurance and pelvic floor contraction.    Personal Factors and Comorbidities Comorbidity 3+;Age;Fitness    Comorbidities fibromyalgia; osteoporosis; cervical disectomy with bone graft; peritonial carcionma; robotic assisted laparoscopic total hysterectomy with bilateral salpingoophorectomy    Examination-Activity Limitations Locomotion Level;Bed Mobility;Carry;Stairs;Stand;Lift;Toileting    Examination-Participation Restrictions Cleaning;Meal Prep;Community Activity;Driving;Laundry    Stability/Clinical Decision Making Evolving/Moderate complexity    Rehab Potential Good    PT Frequency 2x / week    PT Duration 8 weeks    PT Treatment/Interventions ADLs/Self Care Home Management;Neuromuscular re-education;Therapeutic exercise;Balance training;Therapeutic  activities;Functional mobility training;Stair training;Gait training;Patient/family education;Manual techniques;Energy conservation;Dry needling;Scar mobilization;Taping    PT Next Visit Plan work on scar massage, gentle fascial work on perineum, see how the urge to void is going, see how the coconut oil is doing    PT Home Exercise Plan Access Code: [email or web address]    Consulted and Agree with Plan of Care Patient           Patient will benefit from skilled therapeutic intervention in order to improve the following deficits and impairments:  Decreased coordination, Difficulty walking, Increased fascial restricitons, Pain, Decreased strength, Decreased mobility, Decreased balance, Decreased scar mobility, Decreased activity tolerance, Decreased endurance  Visit Diagnosis: Muscle weakness (generalized)  Other abnormalities of gait and mobility  Unspecified lack of coordination     Problem List Patient Active Problem List   Diagnosis Date Noted  . Physical debility 06/17/2019  . Tachycardia 05/17/2019  . Rosacea 05/17/2019  . Lesion of lung 05/16/2019  . Genetic testing 05/16/2019  . Mucositis due to chemotherapy 04/25/2019  . 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 Goals of care, counseling/discussion 03/21/2019  . Family history of ovarian cancer   . Family history of uterine cancer   . Family history  of melanoma   . Family history of prostate cancer   . Family history of pancreatic cancer   . Family history of colon cancer   . Anemia due to antineoplastic chemotherapy 01/18/2019  . Bone pain 12/27/2018  . Glaucoma, narrow-angle   . Peritoneal carcinoma ([place]) 11/25/2018  . LBBB (left bundle branch block) 05/18/2013  . Atrial tachycardia, paroxysmal ([place]) 05/18/2013  . PVC (premature ventricular contraction)   . PAC (premature atrial contraction)   . 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 IBS (irritable bowel syndrome) 11/12/2010   . Bacterial overgrowth syndrome 11/12/2010  . GERD (gastroesophageal reflux disease) 11/12/2010  . PERIPHERAL NEUROPATHY 04/17/2008  . 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 ESOPHAGEAL STRICTURE 04/17/2008  . Diaphragmatic hernia 04/17/2008  . NECK PAIN 04/17/2008    Earlie Counts, PT 09/15/19 11:02 AM   [facility] [address] [place], [place], [address] Phone: [phone]   Fax:  [phone]  Name: [name] [name] MRN: [id number] Date of Birth: 1950-09-05

## 2019-09-20 ENCOUNTER — Telehealth: Payer: Self-pay | Admitting: *Deleted

## 2019-09-20 ENCOUNTER — Ambulatory Visit: Payer: Medicare Other | Admitting: Gynecologic Oncology

## 2019-09-20 NOTE — Telephone Encounter (Signed)
Patient called and left a message to call her back. Attempted to reach the patient with no answer. Left message to call the office back

## 2019-09-21 ENCOUNTER — Encounter: Payer: Self-pay | Admitting: Physical Therapy

## 2019-09-21 ENCOUNTER — Other Ambulatory Visit: Payer: Self-pay

## 2019-09-21 ENCOUNTER — Ambulatory Visit: Payer: Medicare Other | Admitting: Physical Therapy

## 2019-09-21 DIAGNOSIS — R2689 Other abnormalities of gait and mobility: Secondary | ICD-10-CM

## 2019-09-21 DIAGNOSIS — M6281 Muscle weakness (generalized): Secondary | ICD-10-CM

## 2019-09-21 DIAGNOSIS — R279 Unspecified lack of coordination: Secondary | ICD-10-CM

## 2019-09-21 NOTE — Patient Instructions (Addendum)
Moisturizers . They are used in the vagina to hydrate the mucous membrane that make up the vaginal canal. . Designed to keep a more normal acid balance (ph) . Once placed in the vagina, it will last between two to three days.  . Use 2-3 times per week at bedtime  . Ingredients to avoid is glycerin and fragrance, can increase chance of infection  Types of Moisturizers  . Vitamin E vaginal suppositories- Whole foods, Amazon . Moist Again . Coconut oil- can break down condoms . Julva- (Do no use if on Tamoxifen) amazon . Yes moisturizer- amazon . NeuEve Silk , NeuEve Silver for menopausal or over 65 (if have severe vaginal atrophy or cancer treatments use NeuEve Silk for  1 month than move to [employer])- [employer], MapleFlower.dk . Olive and Bee intimate cream- www.oliveandbee.com.au . Mae vaginal [place] . Aloe .    Creams to use externally on the Vulva area  Albertson's (good for for cancer patients that had radiation to the area)- [place] or [employer].FlyingBasics.com.br  V-magic cream - amazon  Julva-amazon  Vital "V [place] salve ( help moisturize and help with thinning vulvar area, does have [place] by Irwin Brakeman labial moisturizer (Greenback,   Coconut or olive oil  aloe   Things to avoid in the vaginal area . Do not use things to irritate the vulvar area . No lotions just specialized creams for the vulva area- Neogyn, V-magic, No soaps; can use Aveeno or Calendula cleanser if needed. Must be gentle . No deodorants . No douches . Good to sleep without underwear to let the vaginal area to air out . No scrubbing: spread the lips to let warm water rinse over labias and pat dry [facility] [address], [place], [place] [address] Phone # [phone] . Fax [phone] .

## 2019-09-21 NOTE — Therapy (Signed)
[facility] [address], [place], [place], [address] Phone: [phone]   Fax:  [phone]  Physical Therapy Treatment  Patient Details  Name: [name] [name] MRN: [id number] Date of Birth: [birth year] Referring Provider (PT): Dr. [name]   Encounter Date: 09/21/2019   PT End of Session - 09/21/19 1107    Visit Number 6    Date for PT Re-Evaluation 10/03/19    Authorization Type Medicare BCBS    PT Start Time 1100    PT Stop Time 1138    PT Time Calculation (min) 38 min    Activity Tolerance Patient tolerated treatment well;No increased pain    Behavior During Therapy WFL for tasks assessed/performed           Past Medical History:  Diagnosis Date  . Allergic rhinitis   . Cervical dysplasia 1980  . Cervicalgia   . Eczema    Rosacea,dermatitis-Dr [employer]  . Esophageal reflux   . Family history of colon cancer   . Family history of melanoma   . Family history of ovarian cancer   . Family history of pancreatic cancer   . Family history of prostate cancer   . Family history of uterine cancer   . Fibromyalgia   . GERD (gastroesophageal reflux disease)   . Glaucoma, narrow-angle   . Heart murmur   . Hiatal hernia   . Irritable bowel syndrome   . LBBB (left bundle branch block)   . Left bundle branch block   . 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 Mitral valve disorders(424.0)    No antibiotics required  . Osteoporosis 12/2016   T score -2.3, 2014 T score -2.5 AP spine  . PAC (premature atrial contraction) 2012   ,PVC's, and nonsustained atril tachycardia w aberration by heart monitor   . PVC (premature ventricular contraction)   . Rosacea   . Stricture and stenosis of esophagus     Past Surgical History:  Procedure Laterality Date  . BREAST LUMPECTOMY  1998   right breast with radiation  .  CERVICAL DISCECTOMY     C3-7 with bone graft, in three different surgeries, have titanium plate and screws  . GYNECOLOGIC CRYOSURGERY  1980  . IR IMAGING GUIDED PORT INSERTION  12/24/2018  . NECK SURGERY  [phone]   bone graft and fusion  . REFRACTIVE SURGERY     narrow angle glucoma  . RHINOPLASTY  1976  . TUBAL LIGATION  1977    There were no vitals filed for this visit.   Subjective Assessment - 09/21/19 1104    Subjective I am trying to extend the toileting. The kegels help a little with reducing the urge. When I have a bowel movement I will urinate. Sometimes I am watching and waiting for the 2 hours to go by before I can urinate.    Patient Stated Goals build endurance, strength, improve balance and bladder issues    Currently in Pain? No/denies                             [facility] Adult PT Treatment/Exercise - 09/21/19 0001      Self-Care   Self-Care Other Self-Care Comments    Other Self-Care Comments  went over the bladder diary and her waiting for 2 hours to urinate,  using the moisturizer internally and externally      Lumbar Exercises: Aerobic   Nustep SEat # 6, arms #11 level 1, 5 min while assessing patient and monitoring for fatique      Knee/Hip Exercises: Standing   Lateral Step Up Right;Left;1 set;15 reps    Lateral Step Up Limitations step on the round side of BOSU ball holding on to the railing    Forward Step Up Right;Left;1 set;15 reps    Forward Step Up Limitations onto the round side of the BOSU ball with one hand on railing    Other Standing Knee Exercises stand on flat side of the BOSU ball and keep balance with therapist hands by her waist    Other Standing Knee Exercises tandem stance 30 sec 3 x each way with area to hold on if needed                  PT Education - 09/21/19 1139    Education Details discussed about internal and external moisturizers    Person(s) Educated Patient    Methods Explanation;Demonstration;Handout     Comprehension Verbalized understanding            PT Short Term Goals - 09/15/19 1059      PT SHORT TERM GOAL #3   Title Berg balance score >/= 43/56    Time 4    Period Weeks    Status Achieved    Target Date 10/03/19             PT Long Term Goals - 09/21/19 1144      PT LONG TERM GOAL #1   Title independent with advanced HEP    Time 8    Period Weeks    Status On-going      PT LONG TERM GOAL #2   Title Berg balance score >/= 48/56    Time 8    Period Weeks    Status On-going      PT LONG TERM GOAL #3   Title able to go up and down the stairs with railing 4 times in the gym with minimal to no fatique due to increased endurance    Time 8    Period Weeks    Status On-going      PT LONG TERM GOAL #4   Title able to go on an outing with her husband and not having to hold onto her arm due to increased endurance and strength    Time 8    Period Weeks    Status On-going      PT LONG TERM GOAL #5   Title report her fatique has decreased >/= 50% due to increased endurance and strength    Time 8    Period Weeks    Status On-going                 Plan - 09/21/19 1108    Clinical Impression Statement Patient vulvar and vaginal tissue is very thin and tears easliy so the patient and therapist decided she is not to have manaual work to the perineum due to the tissue integrity. Patient was challenged with her balance with one legged stance and tandem stance. Patient needed to hold onto something with balance activiteis. Patient is working on voiding every two hours and is able to do it 75% of the time. Patient will benefit from skilled therapy to improve strength, endurance and pelvic floor contraction.    Personal Factors and Comorbidities Comorbidity  3+;Age;Fitness    Comorbidities fibromyalgia; osteoporosis; cervical disectomy with bone graft; peritonial carcionma; robotic assisted laparoscopic total hysterectomy with bilateral salpingoophorectomy     Examination-Activity Limitations Locomotion Level;Bed Mobility;Carry;Stairs;Stand;Lift;Toileting    Examination-Participation Restrictions Cleaning;Meal Prep;Community Activity;Driving;Laundry    Stability/Clinical Decision Making Evolving/Moderate complexity    Rehab Potential Good    PT Frequency 2x / week    PT Duration 8 weeks    PT Treatment/Interventions ADLs/Self Care Home Management;Neuromuscular re-education;Therapeutic exercise;Balance training;Therapeutic activities;Functional mobility training;Stair training;Gait training;Patient/family education;Manual techniques;Energy conservation;Dry needling;Scar mobilization;Taping    PT Next Visit Plan work on balance and write renewal    PT Home Exercise Plan Access Code: [email or web address]    Consulted and Agree with Plan of Care Patient           Patient will benefit from skilled therapeutic intervention in order to improve the following deficits and impairments:  Decreased coordination, Difficulty walking, Increased fascial restricitons, Pain, Decreased strength, Decreased mobility, Decreased balance, Decreased scar mobility, Decreased activity tolerance, Decreased endurance  Visit Diagnosis: Muscle weakness (generalized)  Other abnormalities of gait and mobility  Unspecified lack of coordination     Problem List Patient Active Problem List   Diagnosis Date Noted  . Physical debility 06/17/2019  . Tachycardia 05/17/2019  . Rosacea 05/17/2019  . Lesion of lung 05/16/2019  . Genetic testing 05/16/2019  . Mucositis due to chemotherapy 04/25/2019  . Peripheral neuropathy due to chemotherapy ([place]) 03/21/2019  . Goals of care, counseling/discussion 03/21/2019  . Family history of ovarian cancer   . Family history of uterine cancer   . Family history of melanoma   . Family history of prostate cancer   . Family history of pancreatic cancer   . Family history of colon cancer   . Anemia due to antineoplastic chemotherapy 01/18/2019  .  Bone pain 12/27/2018  . Glaucoma, narrow-angle   . Peritoneal carcinoma ([place]) 11/25/2018  . LBBB (left bundle branch block) 05/18/2013  . Atrial tachycardia, paroxysmal ([place]) 05/18/2013  . PVC (premature ventricular contraction)   . PAC (premature atrial contraction)   . 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 IBS (irritable bowel syndrome) 11/12/2010  . Bacterial overgrowth syndrome 11/12/2010  . GERD (gastroesophageal reflux disease) 11/12/2010  . PERIPHERAL NEUROPATHY 04/17/2008  . 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 ESOPHAGEAL STRICTURE 04/17/2008  . Diaphragmatic hernia 04/17/2008  . NECK PAIN 04/17/2008    Earlie Counts, PT 09/21/19 11:45 AM   [facility] [address] [place], [place], [address] Phone: [phone]   Fax:  [phone]  Name: MAHDIYA MOSSBERG MRN: [id number] Date of Birth: Mar 26, 1950

## 2019-09-28 ENCOUNTER — Other Ambulatory Visit: Payer: Self-pay

## 2019-09-28 ENCOUNTER — Ambulatory Visit: Payer: Medicare Other | Admitting: Physical Therapy

## 2019-09-28 ENCOUNTER — Encounter: Payer: Self-pay | Admitting: Physical Therapy

## 2019-09-28 DIAGNOSIS — M6281 Muscle weakness (generalized): Secondary | ICD-10-CM | POA: Diagnosis not present

## 2019-09-28 DIAGNOSIS — R2689 Other abnormalities of gait and mobility: Secondary | ICD-10-CM

## 2019-09-28 DIAGNOSIS — R279 Unspecified lack of coordination: Secondary | ICD-10-CM

## 2019-09-28 NOTE — Therapy (Signed)
[facility] [address], [place], [place], [address] Phone: [phone]   Fax:  [phone]  Physical Therapy Treatment  Patient Details  Name: [name] [name] MRN: [id number] Date of Birth: 05-20-1950 Referring Provider (PT): Dr. [name]   Encounter Date: 09/28/2019   PT End of Session - 09/28/19 1001    Visit Number 7    Date for PT Re-Evaluation 11/28/19    Authorization Type Medicare BCBS    PT Start Time 0930    PT Stop Time 1015    PT Time Calculation (min) 45 min    Activity Tolerance Patient tolerated treatment well;No increased pain    Behavior During Therapy WFL for tasks assessed/performed           Past Medical History:  Diagnosis Date  . Allergic rhinitis   . Cervical dysplasia 1980  . Cervicalgia   . Eczema    Rosacea,dermatitis-Dr [employer]  . Esophageal reflux   . Family history of colon cancer   . Family history of melanoma   . Family history of ovarian cancer   . Family history of pancreatic cancer   . Family history of prostate cancer   . Family history of uterine cancer   . Fibromyalgia   . GERD (gastroesophageal reflux disease)   . Glaucoma, narrow-angle   . Heart murmur   . Hiatal hernia   . Irritable bowel syndrome   . LBBB (left bundle branch block)   . Left bundle branch block   . 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 Mitral valve disorders(424.0)    No antibiotics required  . Osteoporosis 12/2016   T score -2.3, 2014 T score -2.5 AP spine  . PAC (premature atrial contraction) 2012   ,PVC's, and nonsustained atril tachycardia w aberration by heart monitor   . PVC (premature ventricular contraction)   . Rosacea   . Stricture and stenosis of esophagus     Past Surgical History:  Procedure Laterality Date  . BREAST LUMPECTOMY  1998   right breast with radiation  .  CERVICAL DISCECTOMY     C3-7 with bone graft, in three different surgeries, have titanium plate and screws  . GYNECOLOGIC CRYOSURGERY  1980  . IR IMAGING GUIDED PORT INSERTION  12/24/2018  . NECK SURGERY  [phone]   bone graft and fusion  . REFRACTIVE SURGERY     narrow angle glucoma  . RHINOPLASTY  1976  . TUBAL LIGATION  1977    There were no vitals filed for this visit.   Subjective Assessment - 09/28/19 0940    Subjective I have had low energy. I have had increased body pain since Saturday. I have had the heel pain before and it has gone away.    Patient Stated Goals build endurance, strength, improve balance and bladder issues    Currently in Pain? Yes    Pain Score 8    right foot 3/10   Pain Location Heel    Pain Orientation Right;Left    Pain Descriptors / Indicators Sharp    Pain Type Acute pain    Pain Onset More than a month ago    Pain Frequency Intermittent    Aggravating Factors  first thing in the morning when walking    Pain Relieving Factors as the day goes on    Multiple Pain Sites  No              [facility] PT Assessment - 09/28/19 0001      Assessment   Medical Diagnosis C48.2 Peritoneal carcinoma: R53.81 Physical debility    Referring Provider (PT) Dr. [name]    Onset Date/Surgical Date 11/25/18    Prior Therapy none      Precautions   Precautions Other (comment)    Precaution Comments cancer; osteoporosis      Restrictions   Weight Bearing Restrictions No      [place] residence      Prior Function   Level of [place] Retired      Associate Professor   Overall Cognitive Status Within Functional Limits for tasks assessed      Strength   Right Hip Flexion 4+/5    Right Hip Extension 4/5    Right Hip ABduction 3+/5    Left Hip Flexion 4+/5    Left Hip Extension 4/5    Left Hip ABduction 4-/5    Right Knee Flexion 4+/5    Right Knee Extension 4/5    Left Knee Flexion 4+/5     Left Knee Extension 4/5    Right Ankle Dorsiflexion 5/5    Right Ankle Eversion 4/5    Left Ankle Dorsiflexion 5/5    Left Ankle Eversion 4/5      Standardized Balance Assessment   Five times sit to stand comments  12.9 and last one lost balance; 10.8 without loss of balance; 10.4 no balance loss      Berg Balance Test   Sit to Stand Able to stand without using hands and stabilize independently    Standing Unsupported Able to stand 2 minutes with supervision    Sitting with Back Unsupported but Feet Supported on Floor or Stool Able to sit safely and securely 2 minutes    Stand to Sit Controls descent by using hands    Transfers Able to transfer safely, definite need of hands    Standing Unsupported with Eyes Closed Able to stand 10 seconds with supervision    Standing Unsupported with Feet Together Able to place feet together independently but unable to hold for 30 seconds    From Standing, Reach Forward with Outstretched Arm Can reach forward >12 cm safely (5")    From Standing Position, Pick up Object from Floor Able to pick up shoe safely and easily    From Standing Position, Turn to Look Behind Over each Shoulder Looks behind from both sides and weight shifts well    Turn 360 Degrees Able to turn 360 degrees safely in 4 seconds or less    Standing Unsupported, Alternately Place Feet on Step/Stool Able to stand independently and safely and complete 8 steps in 20 seconds    Standing Unsupported, One Foot in Front Able to take small step independently and hold 30 seconds    Standing on One Leg Able to lift leg independently and hold equal to or more than 3 seconds    Total Score 45    Berg comment: 80% chance of falling            Access Code: [email or web address] URL: [email or web address] Date: 09/28/2019 Prepared by: [name] [name]  Exercises Supine Piriformis Stretch Pulling Heel to Hip - 1 x daily - 7 x weekly - 1 sets - 2 reps - 30 sec hold Hooklying Hamstring Stretch with  Strap - 1 x daily -  7 x weekly - 1 sets - 2 reps - 30 sec hold Standing Gastroc Stretch at Counter - 1 x daily - 7 x weekly - 1 sets - 1 reps - 30 sec hold Seated Piriformis Stretch with Trunk Bend - 1 x daily - 7 x weekly - 1 sets - 1 reps - 30 sec hold Seated Long Arc Quad with Ankle Weight - 1 x daily - 7 x weekly - 1 sets - 10 reps Seated Overhead Press with Dumbbells - 1 x daily - 7 x weekly - 10 reps Supine Shoulder Flexion with Anchored Resistance - 1 x daily - 7 x weekly - 1 sets - 10 reps Seated Single Arm Shoulder Flexion with Dumbbells - 1 x daily - 7 x weekly - 1 sets - 10 reps Beginner Bridge - 1 x daily - 7 x weekly - 2 sets - 10 reps Forward Step Up - 1 x daily - 7 x weekly - 1 sets - 10 reps Standing Hip Abduction with Resistance at Ankles and Counter Support - 1 x daily - 7 x weekly - 1 sets - 10 reps Standing 3-Way Leg Reach with Resistance at Ankles and Counter Support - 1 x daily - 7 x weekly - 1 sets - 10 reps Standing Single Leg Stance with Unilateral Counter Support - 1 x daily - 7 x weekly - 3 sets - 10 reps Single Leg Heel Raise with Counter Support - 1 x daily - 7 x weekly - 1 sets - 10 reps Tandem Stance with Support - 1 x daily - 7 x weekly - 1 sets - 2 reps - 30 sec hold  Patient Education How to Prevent Falls What You Can Do to Prevent Falls              [facility] Adult PT Treatment/Exercise - 09/28/19 0001      Lumbar Exercises: Aerobic   Nustep SEat # 6, arms #11 level 1, 5 min while assessing patient and monitoring for fatique      Knee/Hip Exercises: Stretches   Gastroc Stretch Both;2 reps;60 seconds    Gastroc Stretch Limitations on rocker board                  PT Education - 09/28/19 1019    Education Details Access Code: [email or web address]    Person(s) Educated Patient    Methods Explanation;Demonstration;Verbal cues;Handout    Comprehension Returned demonstration;Verbalized understanding            PT Short Term Goals - 09/28/19 1019       PT SHORT TERM GOAL #1   Title independent with initial exercise program    Time 4    Period Weeks    Status Achieved    Target Date 09/05/19      PT SHORT TERM GOAL #2   Title understand ways to manage her balance to reduce the risk of falls    Time 4    Period Weeks    Status Achieved    Target Date 09/05/19      PT SHORT TERM GOAL #3   Title Berg balance score >/= 43/56    Baseline 45/56    Time 4    Period Weeks    Status Achieved    Target Date 10/03/19             PT Long Term Goals - 09/28/19 1020      PT LONG TERM GOAL #1   Title independent  with advanced HEP    Time 8    Period Weeks    Status On-going      PT LONG TERM GOAL #2   Title Berg balance score >/= 48/56    Baseline 45/56    Time 8    Period Weeks    Status On-going      PT LONG TERM GOAL #3   Title able to go up and down the stairs with railing 4 times in the gym with minimal to no fatique due to increased endurance    Time 8    Period Weeks    Status On-going      PT LONG TERM GOAL #4   Title able to go on an outing with her husband and not having to hold onto her arm due to increased endurance and strength    Baseline low energy past several days    Time 8    Period Weeks    Status On-going      PT LONG TERM GOAL #5   Title report her fatique has decreased >/= 50% due to increased endurance and strength    Baseline low endergy the last few days    Time 8    Period Weeks    Status On-going                 Plan - 09/28/19 0946    Clinical Impression Statement Patient [name] [name] balance score increased to 45/56. Patient has improved time with sit to stand but on the first trial she lost her balance on the last rep. Patient has increased strength. Patient is feeling fatiqued the last several days so her energy level has been low. Patient has been consistent with her HEP. She is challenged with her balance for one legged stance, small base of support, and on uneven surfaces. She  has to hold onto something at times. Patient will benefit from skilled therapy to improve strength and endurance and balance.    Personal Factors and Comorbidities Comorbidity 3+;Age;Fitness    Comorbidities fibromyalgia; osteoporosis; cervical disectomy with bone graft; peritonial carcionma; robotic assisted laparoscopic total hysterectomy with bilateral salpingoophorectomy    Examination-Activity Limitations Locomotion Level;Bed Mobility;Carry;Stairs;Stand;Lift;Toileting    Examination-Participation Restrictions Cleaning;Meal Prep;Community Activity;Driving;Laundry    Stability/Clinical Decision Making Evolving/Moderate complexity    Rehab Potential Good    PT Frequency 2x / week    PT Duration 8 weeks    PT Treatment/Interventions ADLs/Self Care Home Management;Neuromuscular re-education;Therapeutic exercise;Balance training;Therapeutic activities;Functional mobility training;Stair training;Gait training;Patient/family education;Manual techniques;Energy conservation;Dry needling;Scar mobilization;Taping    PT Next Visit Plan work on uneven surface balance activities; single leg, walking with head movements, see how stairs are going, instruct on how to perform abdominal massage.    PT Home Exercise Plan Access Code: [email or web address]    Recommended Other Services sent MD renewal note on 09/28/2019    Consulted and Agree with Plan of Care Patient           Patient will benefit from skilled therapeutic intervention in order to improve the following deficits and impairments:  Decreased coordination, Difficulty walking, Increased fascial restricitons, Pain, Decreased strength, Decreased mobility, Decreased balance, Decreased scar mobility, Decreased activity tolerance, Decreased endurance  Visit Diagnosis: Muscle weakness (generalized) - Plan: PT plan of care cert/re-cert  Other abnormalities of gait and mobility - Plan: PT plan of care cert/re-cert  Unspecified lack of coordination - Plan: PT plan of  care cert/re-cert     Problem List Patient Active Problem List  Diagnosis Date Noted  . Physical debility 06/17/2019  . Tachycardia 05/17/2019  . Rosacea 05/17/2019  . Lesion of lung 05/16/2019  . Genetic testing 05/16/2019  . Mucositis due to chemotherapy 04/25/2019  . 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 Goals of care, counseling/discussion 03/21/2019  . Family history of ovarian cancer   . Family history of uterine cancer   . Family history of melanoma   . Family history of prostate cancer   . Family history of pancreatic cancer   . Family history of colon cancer   . Anemia due to antineoplastic chemotherapy 01/18/2019  . Bone pain 12/27/2018  . Glaucoma, narrow-angle   . Peritoneal carcinoma ([place]) 11/25/2018  . LBBB (left bundle branch block) 05/18/2013  . Atrial tachycardia, paroxysmal ([place]) 05/18/2013  . PVC (premature ventricular contraction)   . PAC (premature atrial contraction)   . 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 IBS (irritable bowel syndrome) 11/12/2010  . Bacterial overgrowth syndrome 11/12/2010  . GERD (gastroesophageal reflux disease) 11/12/2010  . PERIPHERAL NEUROPATHY 04/17/2008  . 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 ESOPHAGEAL STRICTURE 04/17/2008  . Diaphragmatic hernia 04/17/2008  . NECK PAIN 04/17/2008    [name] [name], PT 09/28/19 10:28 AM   [facility] [address], [place], [place], [address] Phone: [phone]   Fax:  [phone]  Name: [name] [name] MRN: [id number] Date of Birth: 1951/02/28

## 2019-09-28 NOTE — Patient Instructions (Signed)
Access Code: [email or web address] URL: [email or web address] Date: 09/28/2019 Prepared by: Earlie Counts  Exercises Supine Piriformis Stretch Pulling Heel to Hip - 1 x daily - 7 x weekly - 1 sets - 2 reps - 30 sec hold Hooklying Hamstring Stretch with Strap - 1 x daily - 7 x weekly - 1 sets - 2 reps - 30 sec hold Standing Gastroc Stretch at Counter - 1 x daily - 7 x weekly - 1 sets - 1 reps - 30 sec hold Seated Piriformis Stretch with Trunk Bend - 1 x daily - 7 x weekly - 1 sets - 1 reps - 30 sec hold Seated Long Arc Quad with Ankle Weight - 1 x daily - 7 x weekly - 1 sets - 10 reps Seated Overhead Press with Dumbbells - 1 x daily - 7 x weekly - 10 reps Supine Shoulder Flexion with Anchored Resistance - 1 x daily - 7 x weekly - 1 sets - 10 reps Seated Single Arm Shoulder Flexion with Dumbbells - 1 x daily - 7 x weekly - 1 sets - 10 reps Beginner Bridge - 1 x daily - 7 x weekly - 2 sets - 10 reps Forward Step Up - 1 x daily - 7 x weekly - 1 sets - 10 reps Standing Hip Abduction with Resistance at Ankles and Counter Support - 1 x daily - 7 x weekly - 1 sets - 10 reps Standing 3-Way Leg Reach with Resistance at Ankles and Counter Support - 1 x daily - 7 x weekly - 1 sets - 10 reps Standing Single Leg Stance with Unilateral Counter Support - 1 x daily - 7 x weekly - 3 sets - 10 reps Single Leg Heel Raise with Counter Support - 1 x daily - 7 x weekly - 1 sets - 10 reps Tandem Stance with Support - 1 x daily - 7 x weekly - 1 sets - 2 reps - 30 sec hold  Patient Education How to Prevent Falls What You Can Do to Prevent [facility] [address] [place], [name] [address] Phone # [phone] Fax [phone]

## 2019-10-05 ENCOUNTER — Ambulatory Visit: Payer: Medicare Other | Admitting: Physical Therapy

## 2019-10-05 ENCOUNTER — Encounter: Payer: Self-pay | Admitting: Physical Therapy

## 2019-10-05 ENCOUNTER — Other Ambulatory Visit: Payer: Self-pay

## 2019-10-05 DIAGNOSIS — R279 Unspecified lack of coordination: Secondary | ICD-10-CM

## 2019-10-05 DIAGNOSIS — M6281 Muscle weakness (generalized): Secondary | ICD-10-CM

## 2019-10-05 DIAGNOSIS — R2689 Other abnormalities of gait and mobility: Secondary | ICD-10-CM

## 2019-10-05 NOTE — Therapy (Signed)
[facility] [address], [place], [place], [address] Phone: [phone]   Fax:  [phone]  Physical Therapy Treatment  Patient Details  Name: [name] [name] MRN: [id number] Date of Birth: 1951-02-18 Referring Provider (PT): Dr. [name]   Encounter Date: 10/05/2019   PT End of Session - 10/05/19 0945    Visit Number 8    Date for PT Re-Evaluation 11/28/19    Authorization Type Medicare BCBS    PT Start Time 0930    PT Stop Time 1008    PT Time Calculation (min) 38 min    Activity Tolerance Patient tolerated treatment well;No increased pain    Behavior During Therapy WFL for tasks assessed/performed           Past Medical History:  Diagnosis Date  . Allergic rhinitis   . Cervical dysplasia 1980  . Cervicalgia   . Eczema    Rosacea,dermatitis-Dr [employer]  . Esophageal reflux   . Family history of colon cancer   . Family history of melanoma   . Family history of ovarian cancer   . Family history of pancreatic cancer   . Family history of prostate cancer   . Family history of uterine cancer   . Fibromyalgia   . GERD (gastroesophageal reflux disease)   . Glaucoma, narrow-angle   . Heart murmur   . Hiatal hernia   . Irritable bowel syndrome   . LBBB (left bundle branch block)   . Left bundle branch block   . 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 Mitral valve disorders(424.0)    No antibiotics required  . Osteoporosis 12/2016   T score -2.3, 2014 T score -2.5 AP spine  . PAC (premature atrial contraction) 2012   ,PVC's, and nonsustained atril tachycardia w aberration by heart monitor   . PVC (premature ventricular contraction)   . Rosacea   . Stricture and stenosis of esophagus     Past Surgical History:  Procedure Laterality Date  . BREAST LUMPECTOMY  1998   right breast with radiation  .  CERVICAL DISCECTOMY     C3-7 with bone graft, in three different surgeries, have titanium plate and screws  . GYNECOLOGIC CRYOSURGERY  1980  . IR IMAGING GUIDED PORT INSERTION  12/24/2018  . NECK SURGERY  [phone]   bone graft and fusion  . REFRACTIVE SURGERY     narrow angle glucoma  . RHINOPLASTY  1976  . TUBAL LIGATION  1977    There were no vitals filed for this visit.   Subjective Assessment - 10/05/19 0937    Subjective I have more fatigue at the end of the day.    Patient Stated Goals build endurance, strength, improve balance and bladder issues    Currently in Pain? Yes    Pain Score 4     Pain Location Shoulder    Pain Orientation Right    Pain Descriptors / Indicators Aching    Pain Type Acute pain    Pain Onset In the past 7 days    Pain Frequency Intermittent    Aggravating Factors  fobromyalgia    Pain Relieving Factors resat                             [facility] Adult PT Treatment/Exercise - 10/05/19 0001  Neuro Re-ed    Neuro Re-ed Details  walk with looking up and down with some loss of balance 1 time each way; ; walking lookin left and right ; walking through the ladder with high steps and several loss of balance; walking in figure 8 4 times each way;       Lumbar Exercises: Aerobic   Nustep SEat # 6, arms #11 level 1, 5 min while assessing patient and monitoring for fatique      Manual Therapy   Manual Therapy Soft tissue mobilization    Soft tissue mobilization scar tissue manipulation on the abdomen and instructed patient on how to perform on herself                  PT Education - 10/05/19 1011    Education Details educated patient on scar massage to her abdomen and she returned demonstration    Person(s) Educated Patient    Methods Explanation;Demonstration    Comprehension Verbalized understanding;Returned demonstration            PT Short Term Goals - 09/28/19 1019      PT SHORT TERM GOAL #1   Title independent  with initial exercise program    Time 4    Period Weeks    Status Achieved    Target Date 09/05/19      PT SHORT TERM GOAL #2   Title understand ways to manage her balance to reduce the risk of falls    Time 4    Period Weeks    Status Achieved    Target Date 09/05/19      PT SHORT TERM GOAL #3   Title Berg balance score >/= 43/56    Baseline 45/56    Time 4    Period Weeks    Status Achieved    Target Date 10/03/19             PT Long Term Goals - 10/05/19 1014      PT LONG TERM GOAL #1   Title independent with advanced HEP    Time 8    Period Weeks    Status On-going      PT LONG TERM GOAL #2   Title Berg balance score >/= 48/56    Baseline 45/56    Time 8    Period Weeks    Status On-going      PT LONG TERM GOAL #3   Title able to go up and down the stairs with railing 4 times in the gym with minimal to no fatique due to increased endurance    Time 8    Period Weeks    Status On-going      PT LONG TERM GOAL #4   Title able to go on an outing with her husband and not having to hold onto her arm due to increased endurance and strength    Baseline low energy past several days    Time 8    Period Weeks    Status On-going      PT LONG TERM GOAL #5   Title report her fatique has decreased >/= 50% due to increased endurance and strength    Baseline low endergy the last few days    Time 8    Period Weeks    Status On-going                 Plan - 10/05/19 0941    Clinical Impression Statement Patien thas difficulty  with keeping her balance when walking with head turns and looking up and down. Patient understands how to perform scar massage on her abdomen. There are several restictions of the scar. Patient will have to urinate every hour at times but is stil lworking on increasing the time. Patient will benefit from skilled therapy to improve strength and endurance and balance.    Personal Factors and Comorbidities Comorbidity 3+;Age;Fitness     Comorbidities fibromyalgia; osteoporosis; cervical disectomy with bone graft; peritonial carcionma; robotic assisted laparoscopic total hysterectomy with bilateral salpingoophorectomy    Examination-Activity Limitations Locomotion Level;Bed Mobility;Carry;Stairs;Stand;Lift;Toileting    Examination-Participation Restrictions Cleaning;Meal Prep;Community Activity;Driving;Laundry    Stability/Clinical Decision Making Evolving/Moderate complexity    Rehab Potential Good    PT Frequency 2x / week    PT Duration 8 weeks    PT Treatment/Interventions ADLs/Self Care Home Management;Neuromuscular re-education;Therapeutic exercise;Balance training;Therapeutic activities;Functional mobility training;Stair training;Gait training;Patient/family education;Manual techniques;Energy conservation;Dry needling;Scar mobilization;Taping    PT Next Visit Plan work on uneven surface balance activities; single leg, walking with head movements, see how stairs are going, instruct on how to perform abdominal massage.; abdominal breathing    PT Home Exercise Plan Access Code: [email or web address]    Recommended Other Services MD signed all notes    Consulted and Agree with Plan of Care Patient           Patient will benefit from skilled therapeutic intervention in order to improve the following deficits and impairments:  Decreased coordination, Difficulty walking, Increased fascial restricitons, Pain, Decreased strength, Decreased mobility, Decreased balance, Decreased scar mobility, Decreased activity tolerance, Decreased endurance  Visit Diagnosis: Muscle weakness (generalized)  Other abnormalities of gait and mobility  Unspecified lack of coordination     Problem List Patient Active Problem List   Diagnosis Date Noted  . Physical debility 06/17/2019  . Tachycardia 05/17/2019  . Rosacea 05/17/2019  . Lesion of lung 05/16/2019  . Genetic testing 05/16/2019  . Mucositis due to chemotherapy 04/25/2019  . Peripheral  neuropathy due to chemotherapy ([place]) 03/21/2019  . Goals of care, counseling/discussion 03/21/2019  . Family history of ovarian cancer   . Family history of uterine cancer   . Family history of melanoma   . Family history of prostate cancer   . Family history of pancreatic cancer   . Family history of colon cancer   . Anemia due to antineoplastic chemotherapy 01/18/2019  . Bone pain 12/27/2018  . Glaucoma, narrow-angle   . Peritoneal carcinoma ([place]) 11/25/2018  . LBBB (left bundle branch block) 05/18/2013  . Atrial tachycardia, paroxysmal ([place]) 05/18/2013  . PVC (premature ventricular contraction)   . PAC (premature atrial contraction)   . 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 IBS (irritable bowel syndrome) 11/12/2010  . Bacterial overgrowth syndrome 11/12/2010  . GERD (gastroesophageal reflux disease) 11/12/2010  . PERIPHERAL NEUROPATHY 04/17/2008  . 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 ESOPHAGEAL STRICTURE 04/17/2008  . Diaphragmatic hernia 04/17/2008  . NECK PAIN 04/17/2008    Earlie Counts, PT 10/05/19 10:18 AM   [facility] [address], [place], [place], [address] Phone: [phone]   Fax:  [phone]  Name: [name] [name] MRN: [id number] Date of Birth: 1950-06-14

## 2019-10-12 ENCOUNTER — Other Ambulatory Visit: Payer: Self-pay

## 2019-10-12 ENCOUNTER — Encounter: Payer: Self-pay | Admitting: Physical Therapy

## 2019-10-12 ENCOUNTER — Ambulatory Visit: Payer: Medicare Other | Admitting: Physical Therapy

## 2019-10-12 DIAGNOSIS — M6281 Muscle weakness (generalized): Secondary | ICD-10-CM

## 2019-10-12 DIAGNOSIS — R279 Unspecified lack of coordination: Secondary | ICD-10-CM

## 2019-10-12 DIAGNOSIS — R2689 Other abnormalities of gait and mobility: Secondary | ICD-10-CM

## 2019-10-12 NOTE — Therapy (Signed)
[facility] [address] [place], [place], [address] Phone: [phone]   Fax:  [phone]  Physical Therapy Treatment  Patient Details  Name: [name] [name] MRN: [id number] Date of Birth: Oct 05, 1950 Referring Provider (PT): Dr. [name]   Encounter Date: 10/12/2019   PT End of Session - 10/12/19 1009    Visit Number 9    Date for PT Re-Evaluation 11/28/19    Authorization Type Medicare BCBS    PT Start Time 0930    PT Stop Time 1008    PT Time Calculation (min) 38 min    Activity Tolerance Patient tolerated treatment well;No increased pain    Behavior During Therapy WFL for tasks assessed/performed           Past Medical History:  Diagnosis Date  . Allergic rhinitis   . Cervical dysplasia 1980  . Cervicalgia   . Eczema    Rosacea,dermatitis-Dr [employer]  . Esophageal reflux   . Family history of colon cancer   . Family history of melanoma   . Family history of ovarian cancer   . Family history of pancreatic cancer   . Family history of prostate cancer   . Family history of uterine cancer   . Fibromyalgia   . GERD (gastroesophageal reflux disease)   . Glaucoma, narrow-angle   . Heart murmur   . Hiatal hernia   . Irritable bowel syndrome   . LBBB (left bundle branch block)   . Left bundle branch block   . 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 Mitral valve disorders(424.0)    No antibiotics required  . Osteoporosis 12/2016   T score -2.3, 2014 T score -2.5 AP spine  . PAC (premature atrial contraction) 2012   ,PVC's, and nonsustained atril tachycardia w aberration by heart monitor   . PVC (premature ventricular contraction)   . Rosacea   . Stricture and stenosis of esophagus     Past Surgical History:  Procedure Laterality Date  . BREAST LUMPECTOMY  1998   right breast with radiation  .  CERVICAL DISCECTOMY     C3-7 with bone graft, in three different surgeries, have titanium plate and screws  . GYNECOLOGIC CRYOSURGERY  1980  . IR IMAGING GUIDED PORT INSERTION  12/24/2018  . NECK SURGERY  [phone]   bone graft and fusion  . REFRACTIVE SURGERY     narrow angle glucoma  . RHINOPLASTY  1976  . TUBAL LIGATION  1977    There were no vitals filed for this visit.   Subjective Assessment - 10/12/19 0939    Subjective I still have neuropathy in all toes. The toes are partially numb.I felt okay after last visit.    Patient Stated Goals build endurance, strength, improve balance and bladder issues    Currently in Pain? No/denies                             [facility] Adult PT Treatment/Exercise - 10/12/19 0001      Neuro Re-ed    Neuro Re-ed Details  walk throug the ladder forward and side step with high step      Lumbar Exercises: Aerobic   Nustep SEat # 6, arms #11 level 1, 5 min while assessing patient and monitoring for fatique  Knee/Hip Exercises: Standing   SLS SLS while bending forward to pick up cones 5 times each leg    Other Standing Knee Exercises going up and down  especially working on control going down the steps due to decreased balance and will need handrails at times    Other Standing Knee Exercises tandem stance on foam ovals with one foot ahead of other with CG. 4 times each side      Shoulder Exercises: Standing   Extension Strengthening;Both;10 reps;Theraband    Theraband Level (Shoulder Extension) Level 1 (Yellow)   2 sets   Extension Limitations standing on foam ovals    Row Strengthening;Both;10 reps;Theraband   2 x 10   Theraband Level (Shoulder Row) Level 1 (Yellow)    Row Limitations standing on foam ovals                    PT Short Term Goals - 09/28/19 1019      PT SHORT TERM GOAL #1   Title independent with initial exercise program    Time 4    Period Weeks    Status Achieved    Target Date 09/05/19       PT SHORT TERM GOAL #2   Title understand ways to manage her balance to reduce the risk of falls    Time 4    Period Weeks    Status Achieved    Target Date 09/05/19      PT SHORT TERM GOAL #3   Title Berg balance score >/= 43/56    Baseline 45/56    Time 4    Period Weeks    Status Achieved    Target Date 10/03/19             PT Long Term Goals - 10/12/19 1012      PT LONG TERM GOAL #1   Title independent with advanced HEP    Time 8    Period Weeks    Status On-going      PT LONG TERM GOAL #2   Title Berg balance score >/= 48/56    Baseline 45/56    Time 8    Period Weeks    Status On-going      PT LONG TERM GOAL #3   Title able to go up and down the stairs with railing 4 times in the gym with minimal to no fatique due to increased endurance    Time 8    Period Weeks    Status On-going      PT LONG TERM GOAL #4   Title able to go on an outing with her husband and not having to hold onto her arm due to increased endurance and strength    Baseline low energy past several days    Period Weeks    Status On-going      PT LONG TERM GOAL #5   Title report her fatique has decreased >/= 50% due to increased endurance and strength    Baseline low endergy the last few days    Time 8    Period Weeks    Status On-going                 Plan - 10/12/19 1005    Clinical Impression Statement Patient has difficulty going down steps due to decreased eccentric control of quads and decreased single leg balance. Patient has difficulty with tandem stance on oval foam. Patient will waver with walking with head movement  and walking through the ladder. Patient still has trouble hold her urine every 2 hours at times. Patient will benefit from skilled therapy to improve strength and endurance and balance.    Personal Factors and Comorbidities Comorbidity 3+;Age;Fitness    Comorbidities fibromyalgia; osteoporosis; cervical disectomy with bone graft; peritonial carcionma;  robotic assisted laparoscopic total hysterectomy with bilateral salpingoophorectomy    Examination-Activity Limitations Locomotion Level;Bed Mobility;Carry;Stairs;Stand;Lift;Toileting    Stability/Clinical Decision Making Evolving/Moderate complexity    Rehab Potential Good    PT Frequency 2x / week    PT Duration 8 weeks    PT Treatment/Interventions ADLs/Self Care Home Management;Neuromuscular re-education;Therapeutic exercise;Balance training;Therapeutic activities;Functional mobility training;Stair training;Gait training;Patient/family education;Manual techniques;Energy conservation;Dry needling;Scar mobilization;Taping    PT Next Visit Plan breathing to relax the pelvic floor to work on bladder control; balance exercises; write 10th note    PT Home Exercise Plan Access Code: [email or web address]    Consulted and Agree with Plan of Care Patient           Patient will benefit from skilled therapeutic intervention in order to improve the following deficits and impairments:  Decreased coordination, Difficulty walking, Increased fascial restricitons, Pain, Decreased strength, Decreased mobility, Decreased balance, Decreased scar mobility, Decreased activity tolerance, Decreased endurance  Visit Diagnosis: Muscle weakness (generalized)  Other abnormalities of gait and mobility  Unspecified lack of coordination     Problem List Patient Active Problem List   Diagnosis Date Noted  . Physical debility 06/17/2019  . Tachycardia 05/17/2019  . Rosacea 05/17/2019  . Lesion of lung 05/16/2019  . Genetic testing 05/16/2019  . Mucositis due to chemotherapy 04/25/2019  . Peripheral neuropathy due to chemotherapy ([place]) 03/21/2019  . Goals of care, counseling/discussion 03/21/2019  . Family history of ovarian cancer   . Family history of uterine cancer   . Family history of melanoma   . Family history of prostate cancer   . Family history of pancreatic cancer   . Family history of colon cancer   .  Anemia due to antineoplastic chemotherapy 01/18/2019  . Bone pain 12/27/2018  . Glaucoma, narrow-angle   . Peritoneal carcinoma ([place]) 11/25/2018  . LBBB (left bundle branch block) 05/18/2013  . Atrial tachycardia, paroxysmal ([place]) 05/18/2013  . PVC (premature ventricular contraction)   . PAC (premature atrial contraction)   . 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 IBS (irritable bowel syndrome) 11/12/2010  . Bacterial overgrowth syndrome 11/12/2010  . GERD (gastroesophageal reflux disease) 11/12/2010  . PERIPHERAL NEUROPATHY 04/17/2008  . 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 ESOPHAGEAL STRICTURE 04/17/2008  . Diaphragmatic hernia 04/17/2008  . NECK PAIN 04/17/2008    Earlie Counts, PT 10/12/19 10:14 AM   [facility] [address] [place], [place], [address] Phone: [phone]   Fax:  [phone]  Name: CHRISTON GALLAWAY MRN: [id number] Date of Birth: 15-Aug-1950

## 2019-10-24 ENCOUNTER — Other Ambulatory Visit: Payer: Self-pay

## 2019-10-24 ENCOUNTER — Inpatient Hospital Stay: Payer: Medicare Other | Attending: Gynecologic Oncology

## 2019-10-24 DIAGNOSIS — C482 Malignant neoplasm of peritoneum, unspecified: Secondary | ICD-10-CM | POA: Diagnosis present

## 2019-10-24 DIAGNOSIS — Z452 Encounter for adjustment and management of vascular access device: Secondary | ICD-10-CM | POA: Insufficient documentation

## 2019-10-24 MED ORDER — HEPARIN SOD (PORK) LOCK FLUSH 100 UNIT/ML IV SOLN
500.0000 [IU] | Freq: Once | INTRAVENOUS | Status: AC
  Administered 2019-10-24: 500 [IU]
  Filled 2019-10-24: qty 5

## 2019-10-24 MED ORDER — SODIUM CHLORIDE 0.9% FLUSH
10.0000 mL | Freq: Once | INTRAVENOUS | Status: AC
  Administered 2019-10-24: 10 mL
  Filled 2019-10-24: qty 10

## 2019-11-10 ENCOUNTER — Ambulatory Visit: Payer: Medicare Other | Admitting: Physical Therapy

## 2019-11-14 MED FILL — LIDOCAINE-PRILOCAINE 2.5-2.: 2.5-2.5 | 15 days supply | Qty: 30 | Fill #2

## 2019-11-15 ENCOUNTER — Encounter: Payer: Self-pay | Admitting: Hematology and Oncology

## 2019-11-16 ENCOUNTER — Encounter: Payer: Self-pay | Admitting: Physical Therapy

## 2019-11-16 ENCOUNTER — Ambulatory Visit: Payer: Medicare Other | Attending: Hematology and Oncology | Admitting: Physical Therapy

## 2019-11-16 ENCOUNTER — Other Ambulatory Visit: Payer: Self-pay

## 2019-11-16 DIAGNOSIS — R279 Unspecified lack of coordination: Secondary | ICD-10-CM | POA: Diagnosis present

## 2019-11-16 DIAGNOSIS — M6281 Muscle weakness (generalized): Secondary | ICD-10-CM | POA: Diagnosis present

## 2019-11-16 DIAGNOSIS — R2689 Other abnormalities of gait and mobility: Secondary | ICD-10-CM

## 2019-11-16 NOTE — Patient Instructions (Signed)
Access Code: [email or web address] URL: [email or web address] Date: 11/16/2019 Prepared by: Earlie Counts  Exercises Supine Piriformis Stretch Pulling Heel to Hip - 1 x daily - 7 x weekly - 1 sets - 2 reps - 30 sec hold Hooklying Hamstring Stretch with Strap - 1 x daily - 7 x weekly - 1 sets - 2 reps - 30 sec hold Standing Gastroc Stretch at Counter - 1 x daily - 7 x weekly - 1 sets - 1 reps - 30 sec hold Seated Piriformis Stretch with Trunk Bend - 1 x daily - 7 x weekly - 1 sets - 1 reps - 30 sec hold Seated Long Arc Quad with Ankle Weight - 1 x daily - 7 x weekly - 1 sets - 10 reps Seated Overhead Press with Dumbbells - 1 x daily - 7 x weekly - 10 reps Seated Single Arm Shoulder Flexion with Dumbbells - 1 x daily - 7 x weekly - 1 sets - 10 reps Beginner Bridge - 1 x daily - 7 x weekly - 2 sets - 10 reps Forward Step Up - 1 x daily - 7 x weekly - 1 sets - 10 reps Standing Hip Abduction with Resistance at Ankles and Counter Support - 1 x daily - 7 x weekly - 1 sets - 10 reps Standing 3-Way Leg Reach with Resistance at Ankles and Counter Support - 1 x daily - 7 x weekly - 1 sets - 10 reps Standing Single Leg Stance with Unilateral Counter Support - 1 x daily - 7 x weekly - 3 sets - 10 reps Single Leg Heel Raise with Counter Support - 1 x daily - 7 x weekly - 1 sets - 10 reps Tandem Stance with Support - 1 x daily - 7 x weekly - 1 sets - 2 reps - 30 sec hold Seated Diaphragmatic Breathing - 1 x daily - 7 x weekly - 1 sets - 5 reps  Patient Education How to Prevent Falls What You Can Do to Prevent [facility] [address] [place], [place] [address] Phone # [phone] Fax [phone]

## 2019-11-16 NOTE — Therapy (Addendum)
[facility] [address] [place], [place], [address] Phone: [phone]   Fax:  [phone]  Physical Therapy Treatment  Patient Details  Name: [name] [name] MRN: [id number] Date of Birth: [birth year] Referring Provider (PT): Dr. [name] Progress Note Reporting Period 08/08/2019 to 11/16/2019  See note below for Objective Data and Assessment of Progress/Goals.       Encounter Date: 11/16/2019   PT End of Session - 11/16/19 1141    Visit Number 10    Date for PT Re-Evaluation 11/28/19    Authorization Type Medicare BCBS    PT Start Time 1100    PT Stop Time 1141    PT Time Calculation (min) 41 min    Activity Tolerance Patient tolerated treatment well;No increased pain    Behavior During Therapy WFL for tasks assessed/performed           Past Medical History:  Diagnosis Date  . Allergic rhinitis   . Cervical dysplasia 1980  . Cervicalgia   . Eczema    Rosacea,dermatitis-Dr [employer]  . Esophageal reflux   . Family history of colon cancer   . Family history of melanoma   . Family history of ovarian cancer   . Family history of pancreatic cancer   . Family history of prostate cancer   . Family history of uterine cancer   . Fibromyalgia   . GERD (gastroesophageal reflux disease)   . Glaucoma, narrow-angle   . Heart murmur   . Hiatal hernia   . Irritable bowel syndrome   . LBBB (left bundle branch block)   . Left bundle branch block   . 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 Mitral valve disorders(424.0)    No antibiotics required  . Osteoporosis 12/2016   T score -2.3, 2014 T score -2.5 AP spine  . PAC (premature atrial contraction) 2012   ,PVC's, and nonsustained atril tachycardia w aberration by heart monitor   . PVC (premature ventricular contraction)   . Rosacea   . Stricture and stenosis of  esophagus     Past Surgical History:  Procedure Laterality Date  . BREAST LUMPECTOMY  1998   right breast with radiation  . CERVICAL DISCECTOMY     C3-7 with bone graft, in three different surgeries, have titanium plate and screws  . GYNECOLOGIC CRYOSURGERY  1980  . IR IMAGING GUIDED PORT INSERTION  12/24/2018  . NECK SURGERY  [phone]   bone graft and fusion  . REFRACTIVE SURGERY     narrow angle glucoma  . RHINOPLASTY  1976  . TUBAL LIGATION  1977    There were no vitals filed for this visit.   Subjective Assessment - 11/16/19 1104    Subjective I can hold my urine longer. I have improved with the bladder. I am getting more strength is my legs and more flexible. I have had lack of energy in the last few weeks. I see the oncologist the middle of Septmber.    Patient Stated Goals build endurance, strength, improve balance and bladder issues    Currently in Pain? No/denies              [facility] PT Assessment - 11/16/19 0001      Assessment   Medical Diagnosis C48.2 Peritoneal carcinoma: R53.81 Physical debility    Referring Provider (PT) Dr. [name]  Onset Date/Surgical Date 11/25/18    Prior Therapy none      Precautions   Precautions Other (comment)    Precaution Comments cancer; osteoporosis      Restrictions   Weight Bearing Restrictions No      [place] residence      Prior Function   Level of [place] Retired      Associate Professor   Overall Cognitive Status Within Functional Limits for tasks assessed      Strength   Right Hip Flexion 4+/5    Right Hip Extension 4/5    Right Hip ABduction 4+/5    Left Hip Flexion 4+/5    Left Hip Extension 4/5    Left Hip ABduction 4+/5    Right Knee Flexion 4+/5    Right Knee Extension 5/5    Left Knee Flexion 4+/5    Left Knee Extension 5/5    Right Ankle Dorsiflexion 5/5    Right Ankle Eversion 4/5    Left Ankle Dorsiflexion 5/5    Left Ankle  Eversion 4/5      Berg Balance Test   Sit to Stand Able to stand without using hands and stabilize independently    Standing Unsupported Able to stand safely 2 minutes    Sitting with Back Unsupported but Feet Supported on Floor or Stool Able to sit safely and securely 2 minutes    Stand to Sit Sits safely with minimal use of hands    Transfers Able to transfer safely, minor use of hands    Standing Unsupported with Eyes Closed Able to stand 10 seconds with supervision    Standing Unsupported with Feet Together Able to place feet together independently and stand for 1 minute with supervision    From Standing, Reach Forward with Outstretched Arm Can reach confidently >25 cm (10")    From Standing Position, Pick up Object from Floor Able to pick up shoe safely and easily    From Standing Position, Turn to Look Behind Over each Shoulder Looks behind from both sides and weight shifts well    Turn 360 Degrees Able to turn 360 degrees safely in 4 seconds or less    Standing Unsupported, Alternately Place Feet on Step/Stool Able to stand independently and safely and complete 8 steps in 20 seconds    Standing Unsupported, One Foot in Front Able to take small step independently and hold 30 seconds    Standing on One Leg Tries to lift leg/unable to hold 3 seconds but remains standing independently    Total Score 49           Access Code: [email or web address] URL: [email or web address] Date: 11/16/2019 Prepared by: Earlie Counts  Exercises Supine Piriformis Stretch Pulling Heel to Hip - 1 x daily - 7 x weekly - 1 sets - 2 reps - 30 sec hold Hooklying Hamstring Stretch with Strap - 1 x daily - 7 x weekly - 1 sets - 2 reps - 30 sec hold Standing Gastroc Stretch at Counter - 1 x daily - 7 x weekly - 1 sets - 1 reps - 30 sec hold Seated Piriformis Stretch with Trunk Bend - 1 x daily - 7 x weekly - 1 sets - 1 reps - 30 sec hold Seated Long Arc Quad with Ankle Weight - 1 x daily - 7 x weekly - 1 sets  - 10 reps Seated Overhead Press with Dumbbells - 1 x daily - 7  x weekly - 10 reps Seated Single Arm Shoulder Flexion with Dumbbells - 1 x daily - 7 x weekly - 1 sets - 10 reps Beginner Bridge - 1 x daily - 7 x weekly - 2 sets - 10 reps Forward Step Up - 1 x daily - 7 x weekly - 1 sets - 10 reps Standing Hip Abduction with Resistance at Ankles and Counter Support - 1 x daily - 7 x weekly - 1 sets - 10 reps Standing 3-Way Leg Reach with Resistance at Ankles and Counter Support - 1 x daily - 7 x weekly - 1 sets - 10 reps Standing Single Leg Stance with Unilateral Counter Support - 1 x daily - 7 x weekly - 3 sets - 10 reps Single Leg Heel Raise with Counter Support - 1 x daily - 7 x weekly - 1 sets - 10 reps Tandem Stance with Support - 1 x daily - 7 x weekly - 1 sets - 2 reps - 30 sec hold Seated Diaphragmatic Breathing - 1 x daily - 7 x weekly - 1 sets - 5 reps  Patient Education How to Prevent Falls What You Can Do to Prevent Falls                       PT Education - 11/16/19 1141    Education Details Access Code: [email or web address]    Person(s) Educated Patient    Methods Explanation;Demonstration;Handout    Comprehension Verbalized understanding;Returned demonstration            PT Short Term Goals - 11/16/19 1142      PT SHORT TERM GOAL #1   Title independent with initial exercise program    Time 4    Period Weeks    Status Achieved      PT SHORT TERM GOAL #2   Title understand ways to manage her balance to reduce the risk of falls    Time 4    Period Weeks    Status Achieved    Target Date 09/05/19      PT SHORT TERM GOAL #3   Title Berg balance score >/= 43/56    Time 4    Period Weeks    Status Achieved             PT Long Term Goals - 11/16/19 1142      PT LONG TERM GOAL #1   Title independent with advanced HEP    Time 8    Period Weeks    Status Achieved      PT LONG TERM GOAL #2   Title Berg balance score >/= 48/56    Time 8    Period  Weeks    Status Achieved      PT LONG TERM GOAL #3   Title able to go up and down the stairs with railing 4 times in the gym with minimal to no fatique due to increased endurance    Time 8    Period Weeks    Status Achieved      PT LONG TERM GOAL #4   Title able to go on an outing with her husband and not having to hold onto her arm due to increased endurance and strength    Time 8    Period Weeks    Status Achieved      PT LONG TERM GOAL #5   Title report her fatique has decreased >/= 50% due to increased endurance and  strength    Time 8    Period Weeks    Status Achieved                 Plan - 11/16/19 1143    Clinical Impression Statement Patient has met her goals. Patient Merrilee Jansky balance has improved to 49/56. Patient  has increased in strength. Patient still has neuropathy in her feet. Patient does feel unsteady at times. Patient is now able to hold her urine for 2-4 hours. Patient is independent with her HEP. Patient is ready for discharge.    Personal Factors and Comorbidities Comorbidity 3+;Age;Fitness    Comorbidities fibromyalgia; osteoporosis; cervical disectomy with bone graft; peritonial carcionma; robotic assisted laparoscopic total hysterectomy with bilateral salpingoophorectomy    Examination-Activity Limitations Locomotion Level;Bed Mobility;Carry;Stairs;Stand;Lift;Toileting    Examination-Participation Restrictions Cleaning;Meal Prep;Community Activity;Driving;Laundry    Stability/Clinical Decision Making Evolving/Moderate complexity    Rehab Potential Good    PT Treatment/Interventions ADLs/Self Care Home Management;Neuromuscular re-education;Therapeutic exercise;Balance training;Therapeutic activities;Functional mobility training;Stair training;Gait training;Patient/family education;Manual techniques;Energy conservation;Dry needling;Scar mobilization;Taping    PT Next Visit Plan Discharge to HEP    PT Home Exercise Plan Access Code: [email or web address]    Consulted and  Agree with Plan of Care Patient           Patient will benefit from skilled therapeutic intervention in order to improve the following deficits and impairments:  Decreased coordination, Difficulty walking, Increased fascial restricitons, Pain, Decreased strength, Decreased mobility, Decreased balance, Decreased scar mobility, Decreased activity tolerance, Decreased endurance  Visit Diagnosis: Muscle weakness (generalized)  Other abnormalities of gait and mobility  Unspecified lack of coordination     Problem List Patient Active Problem List   Diagnosis Date Noted  . Physical debility 06/17/2019  . Tachycardia 05/17/2019  . Rosacea 05/17/2019  . Lesion of lung 05/16/2019  . Genetic testing 05/16/2019  . Mucositis due to chemotherapy 04/25/2019  . Peripheral neuropathy due to chemotherapy ([place]) 03/21/2019  . Goals of care, counseling/discussion 03/21/2019  . Family history of ovarian cancer   . Family history of uterine cancer   . Family history of melanoma   . Family history of prostate cancer   . Family history of pancreatic cancer   . Family history of colon cancer   . Anemia due to antineoplastic chemotherapy 01/18/2019  . Bone pain 12/27/2018  . Glaucoma, narrow-angle   . Peritoneal carcinoma ([place]) 11/25/2018  . LBBB (left bundle branch block) 05/18/2013  . Atrial tachycardia, paroxysmal ([place]) 05/18/2013  . PVC (premature ventricular contraction)   . PAC (premature atrial contraction)   . 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 IBS (irritable bowel syndrome) 11/12/2010  . Bacterial overgrowth syndrome 11/12/2010  . GERD (gastroesophageal reflux disease) 11/12/2010  . PERIPHERAL NEUROPATHY 04/17/2008  . 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 ESOPHAGEAL STRICTURE 04/17/2008  . Diaphragmatic hernia 04/17/2008  . NECK PAIN 04/17/2008    Earlie Counts, PT 11/16/19 11:46 AM   [facility] [address] [place], [place], [address] Phone: [phone]   Fax:  [phone]  Name: KADEJAH SANDIFORD MRN: [id number] Date of Birth: 07/02/1950  PHYSICAL THERAPY DISCHARGE SUMMARY  Visits from Start of Care: 10  Current functional level related to goals / functional outcomes: See above.    Remaining deficits: See above.   Education / Equipment: HEP Plan: Patient agrees to discharge.  Patient goals were met. Patient is being discharged due to meeting the stated rehab goals.  Thank you for the referral. Earlie Counts, PT 11/16/19 11:47 AM  ?????

## 2019-12-02 ENCOUNTER — Other Ambulatory Visit: Payer: Medicare Other

## 2019-12-05 ENCOUNTER — Other Ambulatory Visit: Payer: Self-pay

## 2019-12-05 ENCOUNTER — Encounter: Payer: Self-pay | Admitting: Hematology and Oncology

## 2019-12-05 ENCOUNTER — Inpatient Hospital Stay: Payer: Medicare Other

## 2019-12-05 ENCOUNTER — Inpatient Hospital Stay: Payer: Medicare Other | Attending: Gynecologic Oncology | Admitting: Hematology and Oncology

## 2019-12-05 VITALS — BP 145/58 | HR 81 | Temp 98.0°F | Resp 18 | Ht 65.0 in | Wt 105.4 lb

## 2019-12-05 DIAGNOSIS — Z23 Encounter for immunization: Secondary | ICD-10-CM

## 2019-12-05 DIAGNOSIS — Z86000 Personal history of in-situ neoplasm of breast: Secondary | ICD-10-CM | POA: Diagnosis not present

## 2019-12-05 DIAGNOSIS — Z79899 Other long term (current) drug therapy: Secondary | ICD-10-CM | POA: Insufficient documentation

## 2019-12-05 DIAGNOSIS — C482 Malignant neoplasm of peritoneum, unspecified: Secondary | ICD-10-CM

## 2019-12-05 DIAGNOSIS — G629 Polyneuropathy, unspecified: Secondary | ICD-10-CM | POA: Diagnosis not present

## 2019-12-05 DIAGNOSIS — Z885 Allergy status to narcotic agent status: Secondary | ICD-10-CM | POA: Insufficient documentation

## 2019-12-05 DIAGNOSIS — Z881 Allergy status to other antibiotic agents status: Secondary | ICD-10-CM | POA: Insufficient documentation

## 2019-12-05 DIAGNOSIS — Z880 Allergy status to penicillin: Secondary | ICD-10-CM | POA: Diagnosis not present

## 2019-12-05 DIAGNOSIS — Z882 Allergy status to sulfonamides status: Secondary | ICD-10-CM | POA: Insufficient documentation

## 2019-12-05 DIAGNOSIS — Z299 Encounter for prophylactic measures, unspecified: Secondary | ICD-10-CM

## 2019-12-05 DIAGNOSIS — C786 Secondary malignant neoplasm of retroperitoneum and peritoneum: Secondary | ICD-10-CM | POA: Diagnosis not present

## 2019-12-05 DIAGNOSIS — Z888 Allergy status to other drugs, medicaments and biological substances status: Secondary | ICD-10-CM | POA: Insufficient documentation

## 2019-12-05 LAB — CBC WITH DIFFERENTIAL (CANCER CENTER ONLY)
Abs Immature Granulocytes: 0.01 10*3/uL (ref 0.00–0.07)
Basophils Absolute: 0 10*3/uL (ref 0.0–0.1)
Basophils Relative: 0 %
Eosinophils Absolute: 0.1 10*3/uL (ref 0.0–0.5)
Eosinophils Relative: 1 %
HCT: 35.1 % — ABNORMAL LOW (ref 36.0–46.0)
Hemoglobin: 11.6 g/dL — ABNORMAL LOW (ref 12.0–15.0)
Immature Granulocytes: 0 %
Lymphocytes Relative: 35 %
Lymphs Abs: 2.1 10*3/uL (ref 0.7–4.0)
MCH: 30.5 pg (ref 26.0–34.0)
MCHC: 33 g/dL (ref 30.0–36.0)
MCV: 92.4 fL (ref 80.0–100.0)
Monocytes Absolute: 0.4 10*3/uL (ref 0.1–1.0)
Monocytes Relative: 7 %
Neutro Abs: 3.4 10*3/uL (ref 1.7–7.7)
Neutrophils Relative %: 57 %
Platelet Count: 232 10*3/uL (ref 150–400)
RBC: 3.8 MIL/uL — ABNORMAL LOW (ref 3.87–5.11)
RDW: 13.6 % (ref 11.5–15.5)
WBC Count: 6 10*3/uL (ref 4.0–10.5)
nRBC: 0 % (ref 0.0–0.2)

## 2019-12-05 LAB — CMP (CANCER CENTER ONLY)
ALT: 21 U/L (ref 0–44)
AST: 27 U/L (ref 15–41)
Albumin: 4.4 g/dL (ref 3.5–5.0)
Alkaline Phosphatase: 62 U/L (ref 38–126)
Anion gap: 8 (ref 5–15)
BUN: 11 mg/dL (ref 8–23)
CO2: 28 mmol/L (ref 22–32)
Calcium: 9.8 mg/dL (ref 8.9–10.3)
Chloride: 103 mmol/L (ref 98–111)
Creatinine: 0.64 mg/dL (ref 0.44–1.00)
GFR, Est AFR Am: >60 mL/min (ref >60)
GFR, Estimated: >60 mL/min (ref >60)
Glucose, Bld: 82 mg/dL (ref 70–99)
Potassium: 3.6 mmol/L (ref 3.5–5.1)
Sodium: 139 mmol/L (ref 135–145)
Total Bilirubin: 0.6 mg/dL (ref 0.3–1.2)
Total Protein: 7.6 g/dL (ref 6.5–8.1)

## 2019-12-05 IMAGING — CT CT ABD-PELV W/ CM
2 of 5 series · 12 of 36 positions shown, 15 images · IV contrast (OMNIPAQUE)
Comparison: 11/23/2018

CLINICAL DATA: Follow-up peritoneal carcinoma. Currently undergoing
chemotherapy.

EXAM:
CT CHEST, ABDOMEN, AND PELVIS WITH CONTRAST
TECHNIQUE: Multidetector CT imaging of the chest, abdomen and pelvis was
performed following the standard protocol during bolus
administration of intravenous contrast.
CONTRAST:  100mL OMNIPAQUE IOHEXOL 300 MG/ML  SOLN

[Series 2: cap with · axial · 0.64mm/px · z∈[-593,-78]mm · 9 of 129 slices shown, 12 images]
[im 13/129  mediastinal]
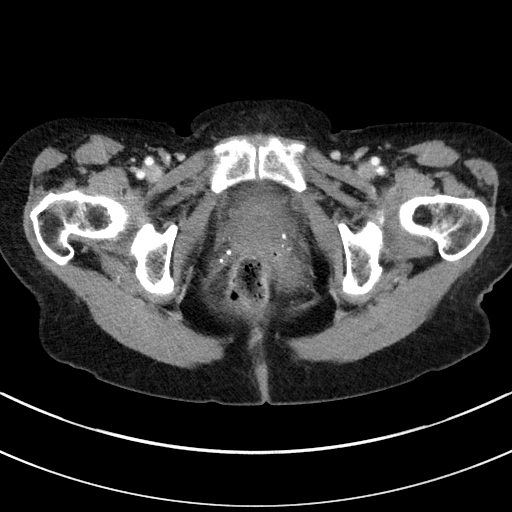
[im 13/129  lung]
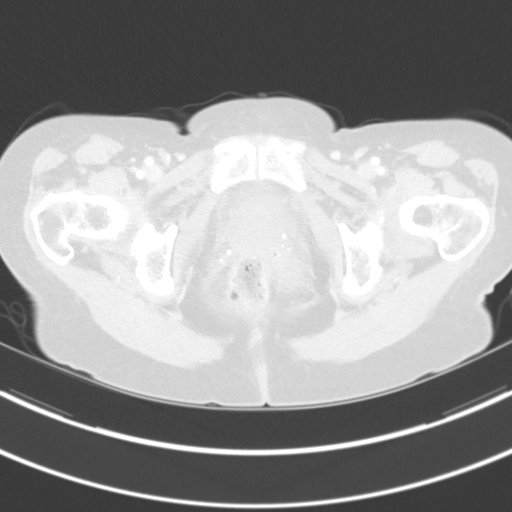
[im 26/129  lung]
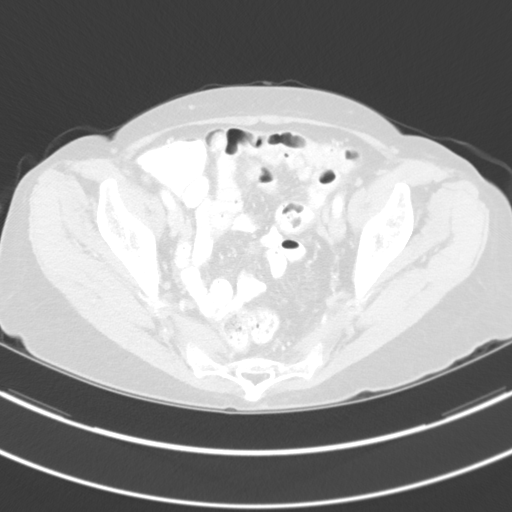
[im 39/129  lung]
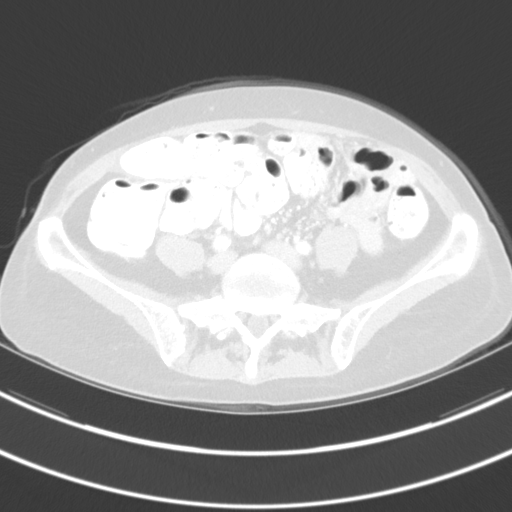
[im 52/129  lung]
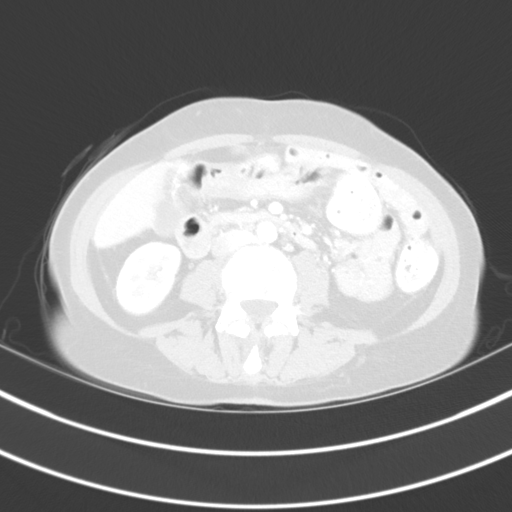
[im 65/129  mediastinal]
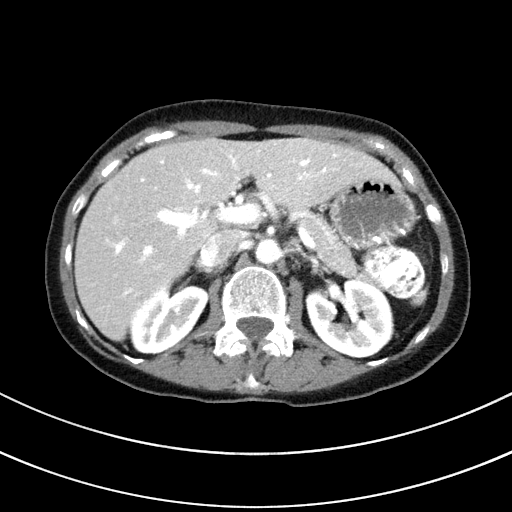
[im 65/129  lung]
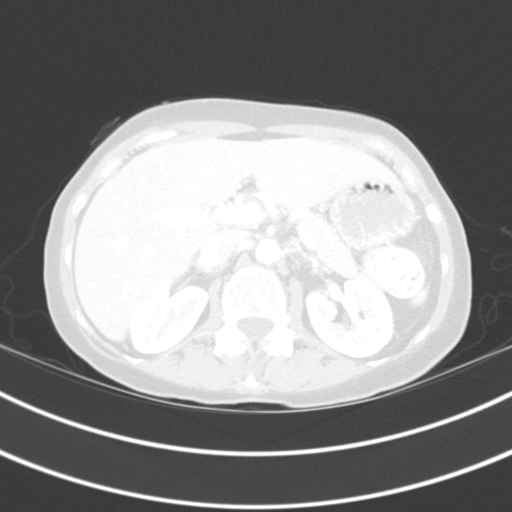
[im 77/129  lung]
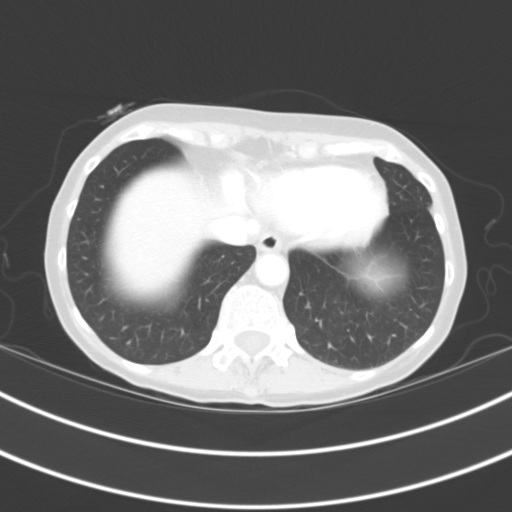
[im 90/129  lung]
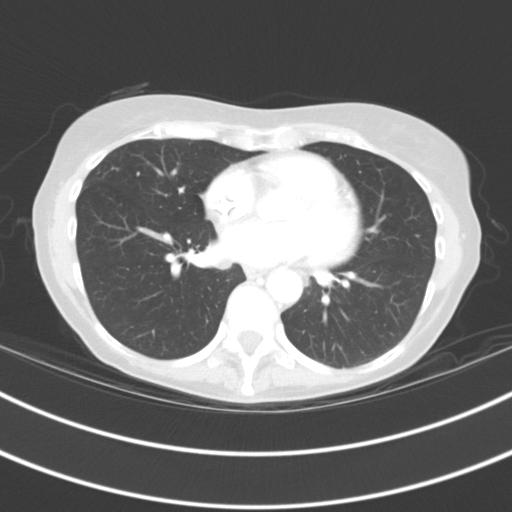
[im 103/129  lung]
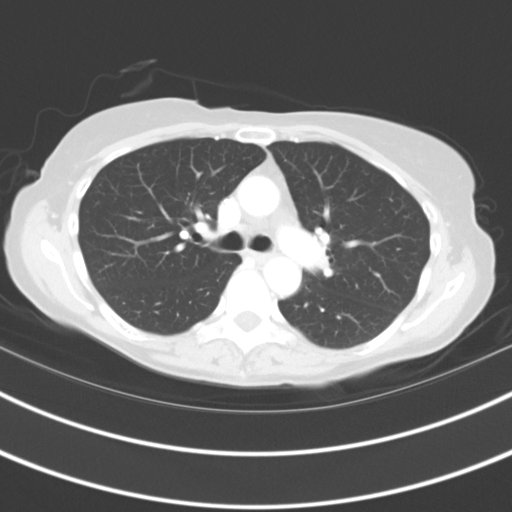
[im 116/129  mediastinal]
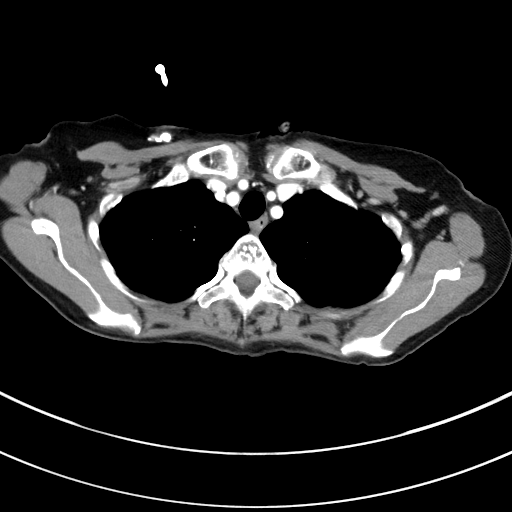
[im 116/129  lung]
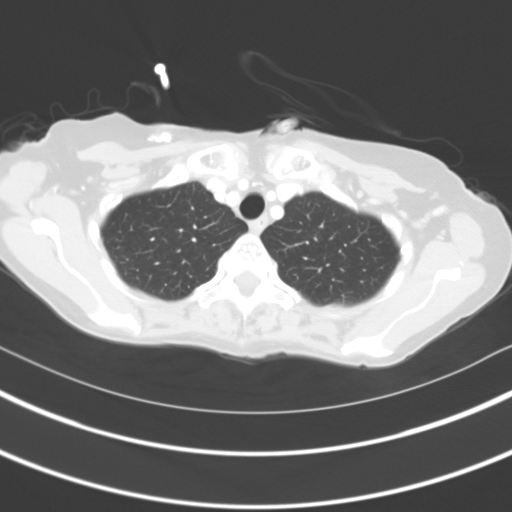

[Series 4: coronals · coronal · 0.65mm/px · 3 of 118 slices shown]
[im 24/118  lung]
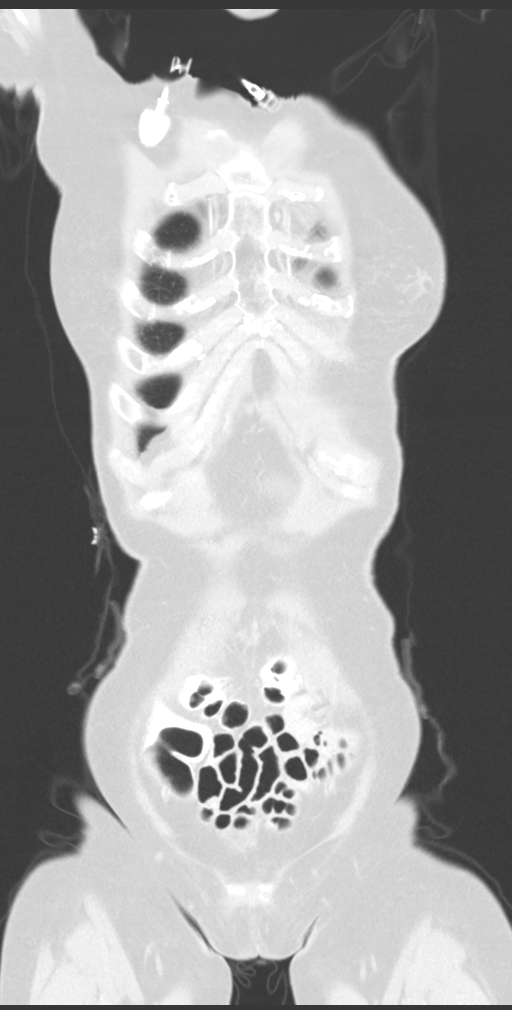
[im 47/118  lung]
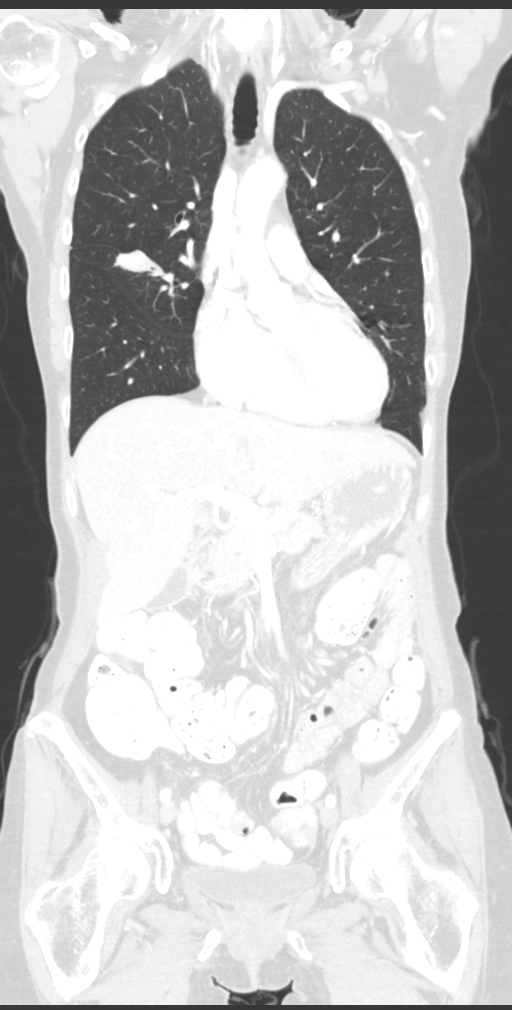
[im 71/118  lung]
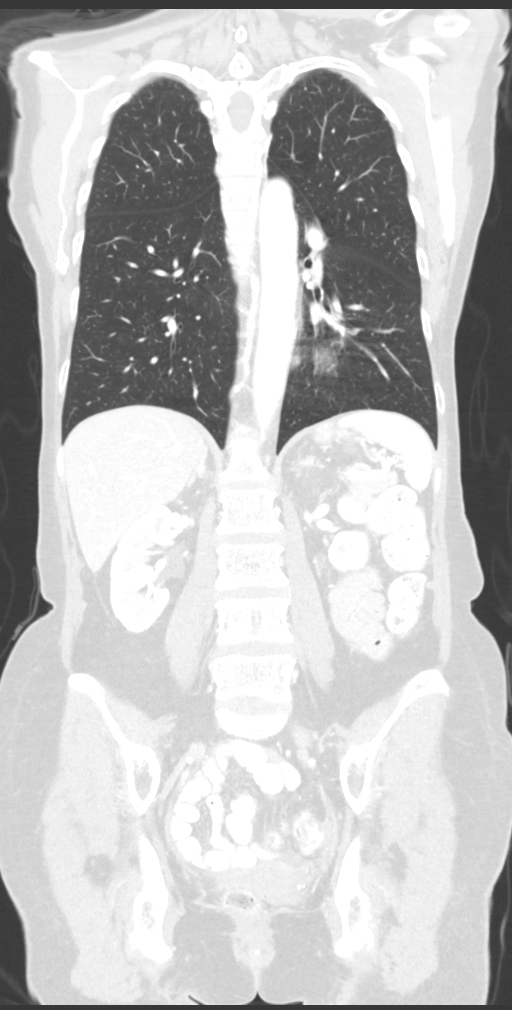

[12 of 36 positions shown; findings below may reference images not displayed]

FINDINGS: CT CHEST FINDINGS

Cardiovascular: No acute findings.

Mediastinum/Lymph Nodes: No masses or pathologically enlarged lymph
nodes identified.

Lungs/Pleura: Focal area of parenchymal consolidation is seen in the
central right middle lobe, which measures 3.0 x 2.0 cm. Differential
diagnosis includes infectious or inflammatory process, atelectasis,
and neoplasm. No other suspicious pulmonary nodules or masses
identified. No evidence of pleural effusion.

Musculoskeletal:  No suspicious bone lesions identified.

CT ABDOMEN AND PELVIS FINDINGS

Hepatobiliary: No masses identified. Gallbladder is unremarkable. No
evidence of biliary ductal dilatation.

Pancreas:  No mass or inflammatory changes.

Spleen: Small capsular low-attenuation lesion along the posterior
margin of the spleen measuring 1.2 cm shows no significant change
compared to previous study.

Adrenals/Urinary tract:  No masses or hydronephrosis.

Stomach/Bowel: No evidence of obstruction, inflammatory process, or
abnormal fluid collections.

Vascular/Lymphatic: No pathologically enlarged lymph nodes
identified. No abdominal aortic aneurysm.

Reproductive:  No mass or other significant abnormality identified.

Other: There has been complete resolution of ascites since previous
study. Decreased peritoneal thickening is seen throughout the
abdomen and pelvis. Decreased bulkiness of abnormal soft tissue
densities in the omental and mesenteric fat are also demonstrated.
No new or enlarging masses identified.

Musculoskeletal:  No suspicious bone lesions identified.
IMPRESSION: Significant decrease in peritoneal carcinomatosis since previous
study. Interval resolution of ascites.

Focal area of parenchymal consolidation in central right middle
lobe, which measures 3 cm. Differential diagnosis includes
infectious or inflammatory process, atelectasis, and neoplasm.
Recommend short-term follow-up by chest CT in 2-3 months.

## 2019-12-05 MED ORDER — SODIUM CHLORIDE 0.9% FLUSH
10.0000 mL | Freq: Once | INTRAVENOUS | Status: AC
  Administered 2019-12-05: 10 mL
  Filled 2019-12-05: qty 10

## 2019-12-05 MED ORDER — HEPARIN SOD (PORK) LOCK FLUSH 100 UNIT/ML IV SOLN
500.0000 [IU] | Freq: Once | INTRAVENOUS | Status: AC
  Administered 2019-12-05: 500 [IU]
  Filled 2019-12-05: qty 5

## 2019-12-05 NOTE — Progress Notes (Signed)
[place] OFFICE PROGRESS NOTE  Patient Care Team: Orpah Melter, MD as PCP - General (Family Medicine) Sueanne Margarita, MD as PCP - Cardiology (Cardiology) Awanda Mink Craige Cotta, RN as Oncology Nurse Navigator (Oncology)  ASSESSMENT & PLAN:  Peritoneal carcinoma [facility]) Clinically, she is doing well with no signs of disease Her tumor marker is pending, and we will call the patient with results tomorrow Assuming her tumor marker is stable, I will schedule port flush every 6-8 weeks and blood work along with tumor marker every 12 weeks She will see GYN surgeon in 3 months and I will see her back in 6 months for further follow-up   History of ductal carcinoma in situ (DCIS) of breast Her examination is benign Recent mammogram is negative She is reassured  Preventive measure We discussed the importance of preventive care and reviewed the vaccination programs. She does not have any prior allergic reactions to Covid-19 vaccination. She agrees to proceed with booster dose of Covid-19 vaccination today and we will administer it today at the clinic.    Orders Placed This Encounter  Procedures  . Pfizer SARS-COV-2 Vaccine    All questions were answered. The patient knows to call the clinic with any problems, questions or concerns. The total time spent in the appointment was 20 minutes encounter with patients including review of chart and various tests results, discussions about plan of care and coordination of care plan   [name], MD 12/05/2019 7:16 PM  INTERVAL HISTORY: Please see below for problem oriented charting. 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 Findings highly suspicious for peritoneal carcinomatosis. Recommend paracentesis for therapeutic and diagnostic purposes. I do not see an obvious primary lesion but there is some irregular enhancing soft tissue in the right adnexal area. CA 125 level may be helpful. 2. Mild surface irregularity involving the liver but I do not see any obvious changes of cirrhosis. No hepatic lesions.   11/23/2018 Tumor Marker   Patient's tumor was tested for the following markers: CA-125 Results of the tumor marker test revealed 253.   11/25/2018 Initial Diagnosis   Peritoneal carcinoma ([place])   11/25/2018 Imaging   US paracentesis Successful ultrasound-guided paracentesis yielding 3.4 L of peritoneal fluid.   12/01/2018 Cancer Staging   Staging form: Ovary, Fallopian Tube, and Primary Peritoneal Carcinoma, AJCC 8th Edition - Clinical stage from 12/01/2018: cT3, cN0, cM0 - Signed by [name], MD on 12/01/2018   12/02/2018 Tumor Marker   Patient's tumor was tested for the following markers: CA-125 Results of the tumor marker test revealed 267   12/06/2018 -  Chemotherapy   The patient had carboplatin and taxol for chemotherapy treatment.     12/08/2018 Procedure   Successful ultrasound-guided therapeutic paracentesis yielding 1.7 liters of peritoneal fluid.   12/24/2018 Procedure   Successful placement of a right IJ approach Power Port with ultrasound and fluoroscopic guidance. The catheter is ready for use   01/17/2019 Tumor Marker   Patient's tumor was tested for the following markers: CA-125 Results of the tumor marker test revealed 57.2   01/31/2019 Imaging   Significant decrease in peritoneal carcinomatosis since  previous study. Interval resolution of ascites.   Focal area of parenchymal consolidation in central right middle lobe, which measures 3 cm. Differential diagnosis includes infectious or inflammatory  process, atelectasis, and neoplasm. Recommend short-term follow-up by chest CT in 2-3 months.     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   Surgeon: Donaciano Eva    Operative Findings:  : grossly normal uterus, ovaries normal, few scattered peritoneal nodules (56m) on serosa of uterus and tubes. The omentum (gastrocolic) was tethered to the mesentery with tumor (thin rind). Complete (optimal) resection of tumor with no gross residual disease.     02/17/2019 Pathology Results   FINAL MICROSCOPIC DIAGNOSIS: A. UTERUS, CERVIX, BILATERAL FALLOPIAN TUBES AND OVARIES, HYSTERECTOMY WITH SALPINGOOOPHORECTOMY: - Uterus: Endometrium: Inactive endometrium. No hyperplasia or malignancy. Myometrium: Unremarkable. No malignancy. Serosa: Metastatic carcinoma. No malignancy. - Cervix: Benign squamous and endocervical mucosa. No dysplasia or malignancy. - Left ovary and fallopian tube: Metastatic carcinoma. - Right ovary: No malignancy identified. - Right fallopian tube: Luminal tumor, see comment. B. OMENTUM, RESECTION: - High grade serous carcinoma. - Deposits up to at least 3 cm. - See oncology table. ONCOLOGY TABLE: OVARY or FALLOPIAN TUBE or PRIMARY PERITONEUM: Procedure: Hysterectomy with bilateral salpingo-oophorectomy and omentectomy. Specimen Integrity: Intact Tumor Site: Peritoneum Ovarian Surface Involvement (required only if applicable): Left ovary Fallopian Tube Surface Involvement (required only if applicable): Left Fallopian tube Tumor Size: Largest deposit 3 cm Histologic Type: High-grade serous carcinoma Histologic Grade: High-grade Implants (required for advanced stage serous/seromucinous borderline tumors only): Uterine serosa,  left fallopian tube and ovary, omentum Other Tissue/ Organ Involvement: As above Largest Extrapelvic Peritoneal Focus (required only if applicable): 3 cm Peritoneal/Ascitic Fluid: Pre neoadjuvant [id number] positive for carcinoma. Treatment Effect (required only for high-grade serous carcinomas): Probably partial treatment effect in omental tissue. Regional Lymph Nodes: No lymph nodes submitted or found Pathologic Stage Classification (pTNM, AJCC 8th Edition): ypT3c, ypNX Representative Tumor Block: B5 Comment(s): There is surface involvement of the left ovary and fallopian tube but no primary tumor. Within the right fallopian tube lumen there is detached fragments of tumor but again no precursor lesion is noted in the right fallopian tube. Thus, the tumor is presumed primary peritoneal and staged as such.   03/21/2019 Tumor Marker   Patient's tumor was tested for the following markers: CA-125 Results of the tumor marker test revealed 15.1    Genetic Testing   Negative genetic testing. No pathogenic variants identified on the [facility] CancerNext + RNAinsight panel. VUS in ATM called [id number] identified. The report date is 05/12/2019. TumorNext HRD was originally ordered but there was not enough sample to complete this testing.   The CancerNext+RNAinsight gene panel offered by [employer] sequencing and rearrangement analysis for the following 36 genes: APC*, ATM*, AXIN2, BARD1, BMPR1A, BRCA1*, BRCA2*, BRIP1*, CDH1*, CDK4, CDKN2A, CHEK2*, DICER1, MLH1*, MSH2*, MSH3, MSH6*, MUTYH*, NBN, NF1*, NTHL1, PALB2*, PMS2*, PTEN*, RAD51C*, RAD51D*, RECQL, SMAD4, SMARCA4, STK11 and TP53* (sequencing and deletion/duplication); HOXB13, POLD1 and POLE (sequencing only); EPCAM and GREM1 (deletion/duplication only). DNA and RNA analyses performed for * genes.    05/16/2019 Tumor Marker   Patient's tumor was tested for the following markers: CA-125 Results of the tumor marker test revealed 10.7   06/16/2019  Imaging   1. No evidence of residual or recurrent metastatic disease  in the chest, abdomen or pelvis. 2. Masslike focus of consolidation in the right middle lobe along the major and minor fissures with associated volume loss is stable since 01/31/2019. Indolent primary pulmonary neoplasm not excluded. PET-CT may be considered for further characterization. 3. Aortic Atherosclerosis (ICD10-I70.0).   06/16/2019 Tumor Marker   Patient's tumor was tested for the following markers: CA-125 Results of the tumor marker test revealed 9.1.     REVIEW OF SYSTEMS:   Constitutional: Denies fevers, chills or abnormal weight loss Eyes: Denies blurriness of vision Ears, nose, mouth, throat, and face: Denies mucositis or sore throat Respiratory: Denies cough, dyspnea or wheezes Cardiovascular: Denies palpitation, chest discomfort or lower extremity swelling Gastrointestinal:  Denies nausea, heartburn or change in bowel habits Skin: Denies abnormal skin rashes Lymphatics: Denies new lymphadenopathy or easy bruising Neurological:Denies numbness, tingling or new weaknesses Behavioral/Psych: Mood is stable, no new changes  All other systems were reviewed with the patient and are negative.  I have reviewed the past medical history, past surgical history, social history and family history with the patient and they are unchanged from previous note.  ALLERGIES:  is allergic to fentanyl, milk-related compounds, oxybutynin chloride, thimerosal, tramadol, avelox [moxifloxacin hcl in nacl], doxycycline, erythromycin, metronidazole, minocycline, penicillins, quinolones, and sulfonamide derivatives.  MEDICATIONS:  Current Outpatient Medications  Medication Sig Dispense Refill  . cephALEXin (KEFLEX) 500 MG capsule Take 500 mg by mouth 3 (three) times daily.    Marland Kitchen acetaminophen (TYLENOL) 500 MG tablet Take 500 mg by mouth every 6 (six) hours as needed for moderate pain or headache.    . ARTIFICIAL TEAR SOLUTION OP  Place 1 drop into both eyes daily as needed (dry eyes).    . Azelaic Acid (FINACEA) 15 % cream Apply 1 application topically daily.    . cholecalciferol (VITAMIN D3) 25 MCG (1000 UNIT) tablet Take 2,000 Units by mouth daily.    . cromolyn (NASALCROM) 5.2 MG/ACT nasal spray Place 1 spray into the nose as needed for allergies.     Marland Kitchen gabapentin (NEURONTIN) 300 MG capsule Take 600 mg by mouth 3 (three) times daily.     Marland Kitchen guaiFENesin (MUCINEX) 600 MG 12 hr tablet Take 600 mg by mouth 2 (two) times daily as needed (congestion).    . Ketotifen Fumarate (ALLERGY EYE DROPS OP) Place 1 drop into both eyes daily as needed (allergies).    Marland Kitchen lidocaine-prilocaine (EMLA) cream Apply to affected area once (Patient taking differently: Apply 1 application topically daily as needed (port access). ) 30 g 3  . loratadine (CLARITIN) 5 MG/5ML syrup Take 5 mg by mouth daily as needed for allergies (bone pain related to chemo).    . mupirocin ointment (BACTROBAN) 2 % Apply 1 application topically as needed. (Patient taking differently: Apply 1 application topically as needed (wound care). ) 22 g 2  . Risedronate Sodium 35 MG TBEC Take 1 tablet (35 mg total) by mouth once a week. (Patient taking differently: Take 1 tablet by mouth every Tuesday. ) 4 tablet 11  . Sulfacetamide Sodium, Acne, 10 % LOTN Apply 1 application topically at bedtime.    . vitamin E 1000 UNIT capsule Take 1,800 Units by mouth daily.     No current facility-administered medications for this visit.    PHYSICAL EXAMINATION: ECOG PERFORMANCE STATUS: 1 - Symptomatic but completely ambulatory  Vitals:   12/05/19 1202  BP: (!) 145/58  Pulse: 81  Resp: 18  Temp: 98 F (36.7 C)  SpO2: 100%  Filed Weights   12/05/19 1202  Weight: 105 lb 6.4 oz (47.8 kg)    GENERAL:alert, no distress and comfortable SKIN: skin color, texture, turgor are normal, no rashes or significant lesions EYES: normal, Conjunctiva are pink and non-injected, sclera  clear OROPHARYNX:no exudate, no erythema and lips, buccal mucosa, and tongue normal  NECK: supple, thyroid normal size, non-tender, without nodularity LYMPH:  no palpable lymphadenopathy in the cervical, axillary or inguinal LUNGS: clear to auscultation and percussion with normal breathing effort HEART: regular rate & rhythm and no murmurs and no lower extremity edema ABDOMEN:abdomen soft, non-tender and normal bowel sounds.  Well-healed surgical scar with no other abnormalities Musculoskeletal:no cyanosis of digits and no clubbing  NEURO: alert & oriented x 3 with fluent speech, no focal motor/sensory deficits Bilateral breast examination is performed.  Well-healed lumpectomy scar on the right without any abnormalities  LABORATORY DATA:  I have reviewed the data as listed    Component Value Date/Time   NA 139 12/05/2019 1145   K 3.6 12/05/2019 1145   CL 103 12/05/2019 1145   CO2 28 12/05/2019 1145   GLUCOSE 82 12/05/2019 1145   BUN 11 12/05/2019 1145   CREATININE 0.64 12/05/2019 1145   CALCIUM 9.8 12/05/2019 1145   PROT 7.6 12/05/2019 1145   ALBUMIN 4.4 12/05/2019 1145   AST 27 12/05/2019 1145   ALT 21 12/05/2019 1145   ALKPHOS 62 12/05/2019 1145   BILITOT 0.6 12/05/2019 1145   GFRNONAA >60 12/05/2019 1145   GFRAA >60 12/05/2019 1145    No results found for: SPEP, UPEP  Lab Results  Component Value Date   WBC 6.0 12/05/2019   NEUTROABS 3.4 12/05/2019   HGB 11.6 (L) 12/05/2019   HCT 35.1 (L) 12/05/2019   MCV 92.4 12/05/2019   PLT 232 12/05/2019      Chemistry      Component Value Date/Time   NA 139 12/05/2019 1145   K 3.6 12/05/2019 1145   CL 103 12/05/2019 1145   CO2 28 12/05/2019 1145   BUN 11 12/05/2019 1145   CREATININE 0.64 12/05/2019 1145      Component Value Date/Time   CALCIUM 9.8 12/05/2019 1145   ALKPHOS 62 12/05/2019 1145   AST 27 12/05/2019 1145   ALT 21 12/05/2019 1145   BILITOT 0.6 12/05/2019 1145

## 2019-12-05 NOTE — Patient Instructions (Signed)

## 2019-12-05 NOTE — Progress Notes (Signed)
° °  Covid-19 Vaccination Clinic  Name:  [name] [name]    MRN: [id number] DOB: 20-Jun-1950  12/05/2019  [name] [name] was observed post Covid-19 immunization for 20 minutes without incident. She was provided with Vaccine Information Sheet and instruction to access the V-Safe system.   [name] [name] was instructed to call 911 with any severe reactions post vaccine:  Difficulty breathing   Swelling of face and throat   A fast heartbeat   A bad rash all over body   Dizziness and weakness

## 2019-12-05 NOTE — Assessment & Plan Note (Signed)
We discussed the importance of preventive care and reviewed the vaccination programs. She does not have any prior allergic reactions to Covid-19 vaccination. She agrees to proceed with booster dose of Covid-19 vaccination today and we will administer it today at the clinic.  

## 2019-12-05 NOTE — Assessment & Plan Note (Signed)
Her examination is benign Recent mammogram is negative She is reassured

## 2019-12-05 NOTE — Assessment & Plan Note (Signed)
Clinically, she is doing well with no signs of disease Her tumor marker is pending, and we will call the patient with results tomorrow Assuming her tumor marker is stable, I will schedule port flush every 6-8 weeks and blood work along with tumor marker every 12 weeks She will see GYN surgeon in 3 months and I will see her back in 6 months for further follow-up

## 2019-12-06 ENCOUNTER — Telehealth: Payer: Self-pay

## 2019-12-06 LAB — CA 125: Cancer Antigen (CA) 125: 9 U/mL (ref 0.0–38.1)

## 2019-12-06 NOTE — Telephone Encounter (Signed)
-----   Message from [name], MD sent at 12/06/2019  8:09 AM EDT ----- Regarding: pls let her know CA-125 is normal

## 2019-12-06 NOTE — Telephone Encounter (Signed)
Called and given below message. She verbalized understanding.  She is complaining of covid vaccine symptoms today. She is taking tylenol for fever and aching. She is gong take is easy today and call the office back if needed.

## 2019-12-07 ENCOUNTER — Telehealth: Payer: Self-pay | Admitting: *Deleted

## 2019-12-07 NOTE — Telephone Encounter (Signed)
Per Dr Alvy Bimler moved the patient's appt from November to December

## 2019-12-20 ENCOUNTER — Other Ambulatory Visit: Payer: Medicare Other

## 2020-01-13 ENCOUNTER — Other Ambulatory Visit: Payer: Medicare Other

## 2020-01-13 ENCOUNTER — Ambulatory Visit: Payer: Medicare Other | Admitting: Hematology and Oncology

## 2020-01-23 ENCOUNTER — Other Ambulatory Visit: Payer: Self-pay

## 2020-01-23 ENCOUNTER — Inpatient Hospital Stay: Payer: Medicare Other | Attending: Gynecologic Oncology

## 2020-01-23 DIAGNOSIS — Z452 Encounter for adjustment and management of vascular access device: Secondary | ICD-10-CM | POA: Insufficient documentation

## 2020-01-23 DIAGNOSIS — C482 Malignant neoplasm of peritoneum, unspecified: Secondary | ICD-10-CM | POA: Insufficient documentation

## 2020-01-23 MED ORDER — SODIUM CHLORIDE 0.9% FLUSH
10.0000 mL | Freq: Once | INTRAVENOUS | Status: AC
  Administered 2020-01-23: 10 mL
  Filled 2020-01-23: qty 10

## 2020-01-23 MED ORDER — HEPARIN SOD (PORK) LOCK FLUSH 100 UNIT/ML IV SOLN
500.0000 [IU] | Freq: Once | INTRAVENOUS | Status: AC
  Administered 2020-01-23: 500 [IU]
  Filled 2020-01-23: qty 5

## 2020-02-01 ENCOUNTER — Other Ambulatory Visit: Payer: Medicare Other

## 2020-02-06 ENCOUNTER — Ambulatory Visit: Payer: Medicare Other | Admitting: Gynecologic Oncology

## 2020-02-29 ENCOUNTER — Other Ambulatory Visit: Payer: Self-pay

## 2020-02-29 ENCOUNTER — Inpatient Hospital Stay: Payer: Medicare Other

## 2020-02-29 ENCOUNTER — Inpatient Hospital Stay: Payer: Medicare Other | Attending: Gynecologic Oncology

## 2020-02-29 DIAGNOSIS — Z79899 Other long term (current) drug therapy: Secondary | ICD-10-CM | POA: Diagnosis not present

## 2020-02-29 DIAGNOSIS — C482 Malignant neoplasm of peritoneum, unspecified: Secondary | ICD-10-CM

## 2020-02-29 DIAGNOSIS — K219 Gastro-esophageal reflux disease without esophagitis: Secondary | ICD-10-CM | POA: Insufficient documentation

## 2020-02-29 DIAGNOSIS — Z90722 Acquired absence of ovaries, bilateral: Secondary | ICD-10-CM | POA: Insufficient documentation

## 2020-02-29 DIAGNOSIS — M810 Age-related osteoporosis without current pathological fracture: Secondary | ICD-10-CM | POA: Insufficient documentation

## 2020-02-29 DIAGNOSIS — Z9221 Personal history of antineoplastic chemotherapy: Secondary | ICD-10-CM | POA: Insufficient documentation

## 2020-02-29 DIAGNOSIS — Z90710 Acquired absence of both cervix and uterus: Secondary | ICD-10-CM | POA: Diagnosis not present

## 2020-02-29 DIAGNOSIS — M797 Fibromyalgia: Secondary | ICD-10-CM | POA: Insufficient documentation

## 2020-02-29 DIAGNOSIS — Z853 Personal history of malignant neoplasm of breast: Secondary | ICD-10-CM | POA: Diagnosis not present

## 2020-02-29 LAB — CBC WITH DIFFERENTIAL (CANCER CENTER ONLY)
Abs Immature Granulocytes: 0.01 10*3/uL (ref 0.00–0.07)
Basophils Absolute: 0.1 10*3/uL (ref 0.0–0.1)
Basophils Relative: 1 %
Eosinophils Absolute: 0.2 10*3/uL (ref 0.0–0.5)
Eosinophils Relative: 4 %
HCT: 36.1 % (ref 36.0–46.0)
Hemoglobin: 11.8 g/dL — ABNORMAL LOW (ref 12.0–15.0)
Immature Granulocytes: 0 %
Lymphocytes Relative: 34 %
Lymphs Abs: 1.8 10*3/uL (ref 0.7–4.0)
MCH: 29.8 pg (ref 26.0–34.0)
MCHC: 32.7 g/dL (ref 30.0–36.0)
MCV: 91.2 fL (ref 80.0–100.0)
Monocytes Absolute: 0.5 10*3/uL (ref 0.1–1.0)
Monocytes Relative: 9 %
Neutro Abs: 2.7 10*3/uL (ref 1.7–7.7)
Neutrophils Relative %: 52 %
Platelet Count: 249 10*3/uL (ref 150–400)
RBC: 3.96 MIL/uL (ref 3.87–5.11)
RDW: 12.9 % (ref 11.5–15.5)
WBC Count: 5.1 10*3/uL (ref 4.0–10.5)
nRBC: 0 % (ref 0.0–0.2)

## 2020-02-29 LAB — CMP (CANCER CENTER ONLY)
ALT: 22 U/L (ref 0–44)
AST: 25 U/L (ref 15–41)
Albumin: 4.5 g/dL (ref 3.5–5.0)
Alkaline Phosphatase: 67 U/L (ref 38–126)
Anion gap: 5 (ref 5–15)
BUN: 12 mg/dL (ref 8–23)
CO2: 31 mmol/L (ref 22–32)
Calcium: 9.8 mg/dL (ref 8.9–10.3)
Chloride: 107 mmol/L (ref 98–111)
Creatinine: 0.68 mg/dL (ref 0.44–1.00)
GFR, Estimated: >60 mL/min (ref >60)
Glucose, Bld: 84 mg/dL (ref 70–99)
Potassium: 3.7 mmol/L (ref 3.5–5.1)
Sodium: 143 mmol/L (ref 135–145)
Total Bilirubin: 0.7 mg/dL (ref 0.3–1.2)
Total Protein: 7.2 g/dL (ref 6.5–8.1)

## 2020-02-29 MED ORDER — SODIUM CHLORIDE 0.9% FLUSH
10.0000 mL | Freq: Once | INTRAVENOUS | Status: AC
  Administered 2020-02-29: 10 mL
  Filled 2020-02-29: qty 10

## 2020-02-29 MED ORDER — HEPARIN SOD (PORK) LOCK FLUSH 100 UNIT/ML IV SOLN
500.0000 [IU] | Freq: Once | INTRAVENOUS | Status: AC
  Administered 2020-02-29: 500 [IU]
  Filled 2020-02-29: qty 5

## 2020-02-29 NOTE — Patient Instructions (Signed)

## 2020-03-01 ENCOUNTER — Encounter: Payer: Self-pay | Admitting: Gynecologic Oncology

## 2020-03-01 ENCOUNTER — Telehealth: Payer: Self-pay

## 2020-03-01 LAB — CA 125: Cancer Antigen (CA) 125: 9.7 U/mL (ref 0.0–38.1)

## 2020-03-01 NOTE — Telephone Encounter (Signed)
Called and given below message. She verbalized understandng.

## 2020-03-01 NOTE — Telephone Encounter (Signed)
-----   Message from [name], MD sent at 03/01/2020  8:42 AM EST ----- Regarding: pls call her and tell her labs and CA-125 is nromal

## 2020-03-02 ENCOUNTER — Other Ambulatory Visit: Payer: Self-pay

## 2020-03-02 ENCOUNTER — Inpatient Hospital Stay (HOSPITAL_BASED_OUTPATIENT_CLINIC_OR_DEPARTMENT_OTHER): Payer: Medicare Other | Admitting: Gynecologic Oncology

## 2020-03-02 ENCOUNTER — Encounter: Payer: Self-pay | Admitting: Gynecologic Oncology

## 2020-03-02 VITALS — BP 122/58 | HR 72 | Temp 97.8°F | Resp 16 | Ht 65.0 in | Wt 103.1 lb

## 2020-03-02 DIAGNOSIS — Z9221 Personal history of antineoplastic chemotherapy: Secondary | ICD-10-CM | POA: Diagnosis not present

## 2020-03-02 DIAGNOSIS — C482 Malignant neoplasm of peritoneum, unspecified: Secondary | ICD-10-CM

## 2020-03-02 DIAGNOSIS — Z90722 Acquired absence of ovaries, bilateral: Secondary | ICD-10-CM | POA: Diagnosis not present

## 2020-03-02 DIAGNOSIS — Z90710 Acquired absence of both cervix and uterus: Secondary | ICD-10-CM | POA: Diagnosis not present

## 2020-03-02 NOTE — Patient Instructions (Signed)
Please notify Dr Denman George at phone number [phone] if you notice vaginal bleeding, new pelvic or abdominal pains, bloating, feeling full easy, or a change in bladder or bowel function.    Please return to see Dr Alvy Bimler in 3 months and Dr Denman George in 6 months.

## 2020-03-02 NOTE — Progress Notes (Signed)
Follow-up Note: Gyn-Onc  Consult was initially requested by Dr. Olen Pel for the evaluation of [name] [name] 69 y.o. female  Chief Complaint  Patient presents with  . Peritoneal carcinoma [facility])    Assessment/Plan:  [name] [name] is a 69 y.o. 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 Therapy completed April, 2021. Complete clinical response.  She desires annual speculum examination due to her sister's history of vulvar melanoma.  She will see Dr Alvy Bimler in 35month with CA 125 and myself in 6 months.   HPI: Ms. [name] LINSONis a 69y.o.   nulliparous female who reported menopause in her late 559s  She reported a 6-year history of fibromyalgia.  She was in her usual state of health until the spring/summer of 2020 when she noticed increased abdominal girth and some dyspnea upon exertion.  After Friday, September 4 she noted poor appetite and a rather significant increase in the abdominal distention.  She reported heartburn and intermittent nausea.  There had been no rectal or vaginal bleeding but she did report intermittent diarrhea.  [name] MAZZAFERROwas evaluated  in the ED for the complaint of abdominal distention on November 23, 2018.  A CT scan was notable for a slightly irregular liver contour.  Significant perihepatic ascites.  There was vague ill-defined enhancing appearing peritoneal lesions in the mesentery worrisome for peritoneal carcinomatosis.  The uterus and left ovary appeared normal but there was an ill-defined irregular enhancing soft tissue noted in the region of the right adnexa without discrete masses.  Large volume abdominal pelvic ascites was noted.  Ca1 25 was collected and returned at a value of 253.  She underwent [place] paracent[place] on 11/25/18 which showed metastatic adenocarcinoma with the immunostains favoring a gynecologic primary.  She was  diagnosed with stage 3C cancer and was recommended neoadjuvant chemotherapy for 3 cycles followed by interval cytoreductive surgery.   Chemotherapy with carboplatin and paclitaxel began on 12/06/2018.  Day 1 of cycle 3 was administered on 01/17/2019. CA 125 on 01/17/19 was 57.2.  CT abd/pelvis on 01/31/19 showed significant decrease in peritoneal carcinomatosis since previous study. Interval resolution of ascites. Focal area of parenchymal consolidation in central right middle lobe, which measures 3 cm. Differential diagnosis includes infectious or inflammatory process, atelectasis, and neoplasm.   On February 17, 2019 she underwent interval debulking with a robotic assisted total hysterectomy with BSO, omentectomy, radical tumor debulking, mini laparotomy for omentectomy. Intraoperative findings were significant for a grossly normal uterus, normal ovaries, few scattered peritoneal nodules 1 mm on the serosa of the uterus and tubes.  The omentum and the gastrocolic region was tethered to the mesentery with tumor.  Surgery included a complete optimal resection with no gross residual disease at the end of the procedure. Surgery was uncomplicated.  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  There were metastatic implants on the serosa of the uterus, ovaries, and fallopian tubes in addition to metastatic foci greater than 2 cm in the omentum.  Somatic and germline BRCA and HRD testing was negative.   She received 3 additional cycles of carboplatin and paclitaxel completed in March, 2021.  CT imaging on 06/16/19 showed complete clinical response (stable pulmonary nodule).  CA 125 normalized to 9.1 on 06/16/19 CA 125  remained normal at 7.4 on 09/09/19. CA 125 was normal at 9.0 on 12/05/19.  Interval Hx CA 125 remained normal at 9.7 on 02/29/20.  She denied  symptoms concerning for recurrence.   Review of Systems: Constitutional  Feels well,  Cardiovascular  No chest pain, shortness of breath, or edema  Pulmonary  No cough or wheeze.  Gastro Intestinal  + abdominal bloating/perception of being full Genito Urinary  No frequency, urgency, dysuria, no vaginal bleeding musculo Skeletal  Intermittent myalgia, arthralgia, joint swelling and pain with flareups of fibromyalgia neurologic , + neuropathy toes No weakness, numbness, change in gait,  Psychology  No depression, anxiety, insomnia.    Current Meds:  Outpatient Encounter Medications as of 03/02/2020  Medication Sig  . acetaminophen (TYLENOL) 500 MG tablet Take 500 mg by mouth every 6 (six) hours as needed for moderate pain or headache.  . ARTIFICIAL TEAR SOLUTION OP Place 1 drop into both eyes daily as needed (dry eyes).  . Azelaic Acid 15 % cream Apply 1 application topically daily.  . cholecalciferol (VITAMIN D3) 25 MCG (1000 UNIT) tablet Take 2,000 Units by mouth daily.  . cromolyn (NASALCROM) 5.2 MG/ACT nasal spray Place 1 spray into the nose as needed for allergies.   Marland Kitchen gabapentin (NEURONTIN) 300 MG capsule Take 600 mg by mouth 3 (three) times daily.   Marland Kitchen Ketotifen Fumarate (ALLERGY EYE DROPS OP) Place 1 drop into both eyes daily as needed (allergies).  Marland Kitchen lidocaine-prilocaine (EMLA) cream Apply to affected area once (Patient taking differently: Apply 1 application topically daily as needed (port access).)  . loratadine (CLARITIN) 5 MG/5ML syrup Take 5 mg by mouth daily as needed for allergies (bone pain related to chemo).  . mupirocin ointment (BACTROBAN) 2 % Apply 1 application topically as needed. (Patient taking differently: Apply 1 application topically as needed (wound care).)  . Risedronate Sodium 35 MG TBEC Take 1 tablet (35 mg total) by mouth once a week. (Patient taking differently: Take 1 tablet by mouth every Tuesday.)  . Sulfacetamide Sodium, Acne, 10 % LOTN Apply 1  application topically at bedtime.  . vitamin E 1000 UNIT capsule Take 1,800 Units by mouth daily.  . [DISCONTINUED] cephALEXin (KEFLEX) 500 MG capsule Take 500 mg by mouth 3 (three) times daily.  Marland Kitchen guaiFENesin (MUCINEX) 600 MG 12 hr tablet Take 600 mg by mouth 2 (two) times daily as needed (congestion). (Patient not taking: Reported on 03/01/2020)   No facility-administered encounter medications on file as of 03/02/2020.    Allergy:  Allergies  Allergen Reactions  . Fentanyl Palpitations    Palpitations , fever ,headahe, N/V  " loud pounding heart beat"   . Milk-Related Compounds Diarrhea and Nausea And Vomiting  . Other Other (See Comments)  . Oxybutynin Chloride Other (See Comments)    Ditropan  Pt doesn't remember   . Thimerosal Other (See Comments)    Pt doesn't remember  . Tramadol Other (See Comments)    Pt doesn't remember  . Avelox [Moxifloxacin Hcl In Nacl] Rash  . Doxycycline Rash  . Erythromycin Rash  . Metronidazole Rash  . Minocycline Rash  . Penicillins Rash    Did it involve swelling of the face/tongue/throat, SOB, or low BP? No Did it involve sudden or severe rash/hives, skin peeling, or any reaction on the inside of your mouth or nose? No Did you need to seek medical attention at a hospital or doctor's office? No When did it last happen?Childhood allergy If all above answers are "  NO", may proceed with cephalosporin use.   . Quinolones Rash  . Sulfonamide Derivatives Rash    Social Hx:   Social History   Socioeconomic History  . Marital status: Married    Spouse name: Antony Haste  . Number of children: 0  . Years of education: Not on file  . Highest education level: Not on file  Occupational History  . Occupation: Teacher, early years/pre: [employer]  Tobacco Use  . Smoking status: Never Smoker  . Smokeless tobacco: Never Used  Vaping Use  . Vaping Use: Never used  Substance and Sexual Activity  . Alcohol use: Yes    Alcohol/week: 2.0  standard drinks    Types: 2 Standard drinks or equivalent per week    Comment: Social   . Drug use: No  . Sexual activity: Not Currently    Birth control/protection: Post-menopausal    Comment: 1st intercourse 69 yo- More than 5 partners  Other Topics Concern  . Not on file  Social History Narrative  . Not on file   Social Determinants of Health   Financial Resource Strain: Not on file  Food Insecurity: Not on file  Transportation Needs: Not on file  Physical Activity: Not on file  Stress: Not on file  Social Connections: Not on file  Intimate Partner Violence: Not on file    Past Surgical Hx:  Past Surgical History:  Procedure Laterality Date  . BREAST LUMPECTOMY  1998   right breast with radiation  . CERVICAL DISCECTOMY     C3-7 with bone graft, in three different surgeries, have titanium plate and screws  . GYNECOLOGIC CRYOSURGERY  1980  . IR IMAGING GUIDED PORT INSERTION  12/24/2018  . NECK SURGERY  [phone]   bone graft and fusion  . REFRACTIVE SURGERY     narrow angle glucoma  . RHINOPLASTY  1976  . TUBAL LIGATION  1977    Past Medical Hx:  Past Medical History:  Diagnosis Date  . Allergic rhinitis   . Cervical dysplasia 1980  . Cervicalgia   . Eczema    Rosacea,dermatitis-Dr [employer]  . Esophageal reflux   . Family history of colon cancer   . Family history of melanoma   . Family history of ovarian cancer   . Family history of pancreatic cancer   . Family history of prostate cancer   . Family history of uterine cancer   . Fibromyalgia   . GERD (gastroesophageal reflux disease)   . Glaucoma, narrow-angle   . Heart murmur   . Hiatal hernia   . Irritable bowel syndrome   . LBBB (left bundle branch block)   . Left bundle branch block   . 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 Mitral valve disorders(424.0)    No antibiotics required  . Osteoporosis  12/2016   T score -2.3, 2014 T score -2.5 AP spine  . PAC (premature atrial contraction) 2012   ,PVC's, and nonsustained atril tachycardia w aberration by heart monitor   . PVC (premature ventricular contraction)   . Rosacea   . Stricture and stenosis of esophagus     Past Gynecological History: Menarche at age 61 menopause late 47s nulliparous bilateral tubal ligation in her 66s.  History of abnormal Pap test treated with cryotherapy negative Pap test since  Family Hx:  Family History  Problem Relation Age of  Onset  . Heart disease Mother   . Depression Mother   . Hypertension Mother   . Heart disease Father   . Hypertension Father   . Aneurysm Father   . Uterine cancer Sister 37  . Hypertension Sister   . Ovarian cancer Sister 71  . Melanoma Sister 56  . Depression Sister   . Melanoma Sister 79  . Heart disease Paternal Grandfather   . Stroke Maternal Grandmother   . Cancer Maternal Grandfather 29       Pancreatic  . Stroke Paternal Grandmother   . Cancer Paternal Uncle        multiple myeloma, prostate cancer and melanoma (separate primaries)  . Colon cancer Neg Hx   Sister #1 ovarian cancer diagnosed at age 44 vulvar melanoma diagnosed at age 38 Sister #2 melanoma in situ Sister #3 cervical cancer Paternal uncle pancreatic cancer multiple myeloma and melanoma  Vitals:  Blood pressure (!) 122/58, pulse 72, temperature 97.8 F (36.6 C), temperature source Tympanic, resp. rate 16, height '5\' 5"'  (1.651 m), weight 103 lb 1.6 oz (46.8 kg), SpO2 100 %.  Physical Exam: WD in NAD Neck  Supple NROM, without any enlargements.  Lymph Node Survey No cervical supraclavicular or inguinal adenopathy Cardiovascular  Pulse normal rate, regularity and rhythm.  Lungs  Clear to auscultation bilaterally, breath sounds present at the bases .  Skin  No rash/lesions/breakdown  Psychiatry  Alert and oriented appropriate mood affect speech and reasoning. Abdomen  Normoactive bowel  sounds, abdomen soft, non-tender, no masses or distension. Incisions well-healed Back No CVA tenderness Genito Urinary  Vaginal cuff is well healed. Speculum exam reveals no vaginal lesions.  Rectal  deferred Extremities  No bilateral cyanosis, clubbing or edema.  Thereasa Solo, MD 03/02/2020, 6:00 PM

## 2020-03-12 ENCOUNTER — Other Ambulatory Visit: Payer: Self-pay | Admitting: Hematology and Oncology

## 2020-03-12 DIAGNOSIS — C482 Malignant neoplasm of peritoneum, unspecified: Secondary | ICD-10-CM

## 2020-03-13 ENCOUNTER — Other Ambulatory Visit: Payer: Self-pay | Admitting: *Deleted

## 2020-03-13 DIAGNOSIS — C482 Malignant neoplasm of peritoneum, unspecified: Secondary | ICD-10-CM

## 2020-03-14 ENCOUNTER — Ambulatory Visit: Payer: Medicare Other | Admitting: Gynecologic Oncology

## 2020-03-14 MED FILL — LIDOCAINE-PRILOCAINE CREAM: 2.5-2.5 | 30 days supply | Qty: 30 | Fill #0

## 2020-04-09 ENCOUNTER — Inpatient Hospital Stay: Payer: Medicare Other | Attending: Gynecologic Oncology

## 2020-04-09 ENCOUNTER — Other Ambulatory Visit: Payer: Self-pay

## 2020-04-09 DIAGNOSIS — Z452 Encounter for adjustment and management of vascular access device: Secondary | ICD-10-CM | POA: Diagnosis not present

## 2020-04-09 DIAGNOSIS — C482 Malignant neoplasm of peritoneum, unspecified: Secondary | ICD-10-CM | POA: Diagnosis not present

## 2020-04-09 MED ORDER — HEPARIN SOD (PORK) LOCK FLUSH 100 UNIT/ML IV SOLN
500.0000 [IU] | Freq: Once | INTRAVENOUS | Status: AC
  Administered 2020-04-09: 500 [IU]
  Filled 2020-04-09: qty 5

## 2020-04-09 MED ORDER — SODIUM CHLORIDE 0.9% FLUSH
10.0000 mL | Freq: Once | INTRAVENOUS | Status: AC
  Administered 2020-04-09: 10 mL
  Filled 2020-04-09: qty 10

## 2020-04-10 ENCOUNTER — Telehealth: Payer: Self-pay

## 2020-04-10 NOTE — Telephone Encounter (Signed)
She called to scheduled flush appt in April. Scheduled per her request.

## 2020-05-18 DIAGNOSIS — H35372 Puckering of macula, left eye: Secondary | ICD-10-CM | POA: Diagnosis not present

## 2020-05-18 DIAGNOSIS — H401421 Capsular glaucoma with pseudoexfoliation of lens, left eye, mild stage: Secondary | ICD-10-CM | POA: Diagnosis not present

## 2020-05-18 DIAGNOSIS — H40033 Anatomical narrow angle, bilateral: Secondary | ICD-10-CM | POA: Diagnosis not present

## 2020-05-18 DIAGNOSIS — H43813 Vitreous degeneration, bilateral: Secondary | ICD-10-CM | POA: Diagnosis not present

## 2020-05-18 DIAGNOSIS — H2513 Age-related nuclear cataract, bilateral: Secondary | ICD-10-CM | POA: Diagnosis not present

## 2020-05-28 ENCOUNTER — Other Ambulatory Visit: Payer: Self-pay

## 2020-05-28 ENCOUNTER — Inpatient Hospital Stay: Payer: Medicare Other

## 2020-05-28 ENCOUNTER — Inpatient Hospital Stay: Payer: Medicare Other | Attending: Gynecologic Oncology

## 2020-05-28 DIAGNOSIS — G629 Polyneuropathy, unspecified: Secondary | ICD-10-CM | POA: Insufficient documentation

## 2020-05-28 DIAGNOSIS — C482 Malignant neoplasm of peritoneum, unspecified: Secondary | ICD-10-CM

## 2020-05-28 DIAGNOSIS — Z79899 Other long term (current) drug therapy: Secondary | ICD-10-CM | POA: Diagnosis not present

## 2020-05-28 DIAGNOSIS — M797 Fibromyalgia: Secondary | ICD-10-CM | POA: Insufficient documentation

## 2020-05-28 DIAGNOSIS — D638 Anemia in other chronic diseases classified elsewhere: Secondary | ICD-10-CM | POA: Diagnosis not present

## 2020-05-28 DIAGNOSIS — R5381 Other malaise: Secondary | ICD-10-CM | POA: Insufficient documentation

## 2020-05-28 DIAGNOSIS — Z8589 Personal history of malignant neoplasm of other organs and systems: Secondary | ICD-10-CM | POA: Insufficient documentation

## 2020-05-28 DIAGNOSIS — M810 Age-related osteoporosis without current pathological fracture: Secondary | ICD-10-CM | POA: Insufficient documentation

## 2020-05-28 LAB — CBC WITH DIFFERENTIAL (CANCER CENTER ONLY)
Abs Immature Granulocytes: 0.01 10*3/uL (ref 0.00–0.07)
Basophils Absolute: 0 10*3/uL (ref 0.0–0.1)
Basophils Relative: 1 %
Eosinophils Absolute: 0.1 10*3/uL (ref 0.0–0.5)
Eosinophils Relative: 2 %
HCT: 34.6 % — ABNORMAL LOW (ref 36.0–46.0)
Hemoglobin: 11.5 g/dL — ABNORMAL LOW (ref 12.0–15.0)
Immature Granulocytes: 0 %
Lymphocytes Relative: 34 %
Lymphs Abs: 1.9 10*3/uL (ref 0.7–4.0)
MCH: 30.4 pg (ref 26.0–34.0)
MCHC: 33.2 g/dL (ref 30.0–36.0)
MCV: 91.5 fL (ref 80.0–100.0)
Monocytes Absolute: 0.3 10*3/uL (ref 0.1–1.0)
Monocytes Relative: 6 %
Neutro Abs: 3.3 10*3/uL (ref 1.7–7.7)
Neutrophils Relative %: 57 %
Platelet Count: 210 10*3/uL (ref 150–400)
RBC: 3.78 MIL/uL — ABNORMAL LOW (ref 3.87–5.11)
RDW: 13.4 % (ref 11.5–15.5)
WBC Count: 5.8 10*3/uL (ref 4.0–10.5)
nRBC: 0 % (ref 0.0–0.2)

## 2020-05-28 LAB — CMP (CANCER CENTER ONLY)
ALT: 17 U/L (ref 0–44)
AST: 22 U/L (ref 15–41)
Albumin: 4.3 g/dL (ref 3.5–5.0)
Alkaline Phosphatase: 56 U/L (ref 38–126)
Anion gap: 8 (ref 5–15)
BUN: 10 mg/dL (ref 8–23)
CO2: 27 mmol/L (ref 22–32)
Calcium: 9.2 mg/dL (ref 8.9–10.3)
Chloride: 106 mmol/L (ref 98–111)
Creatinine: 0.67 mg/dL (ref 0.44–1.00)
GFR, Estimated: >60 mL/min (ref >60)
Glucose, Bld: 84 mg/dL (ref 70–99)
Potassium: 3.8 mmol/L (ref 3.5–5.1)
Sodium: 141 mmol/L (ref 135–145)
Total Bilirubin: 0.6 mg/dL (ref 0.3–1.2)
Total Protein: 7.2 g/dL (ref 6.5–8.1)

## 2020-05-28 MED ORDER — SODIUM CHLORIDE 0.9% FLUSH
10.0000 mL | Freq: Once | INTRAVENOUS | Status: AC
  Administered 2020-05-28: 10 mL
  Filled 2020-05-28: qty 10

## 2020-05-28 MED ORDER — HEPARIN SOD (PORK) LOCK FLUSH 100 UNIT/ML IV SOLN
500.0000 [IU] | Freq: Once | INTRAVENOUS | Status: AC
  Administered 2020-05-28: 500 [IU]
  Filled 2020-05-28: qty 5

## 2020-05-28 NOTE — Patient Instructions (Signed)
Implanted Port Insertion, Care After This sheet gives you information about how to care for yourself after your procedure. Your health care provider may also give you more specific instructions. If you have problems or questions, contact your health care provider. What can I expect after the procedure? After the procedure, it is common to have:  Discomfort at the port insertion site.  Bruising on the skin over the port. This should improve over 3-4 days. Follow these instructions at home: Port care  After your port is placed, you will get a manufacturer's information card. The card has information about your port. Keep this card with you at all times.  Take care of the port as told by your health care provider. Ask your health care provider if you or a family member can get training for taking care of the port at home. A home health care nurse may also take care of the port.  Make sure to remember what type of port you have. Incision care  Follow instructions from your health care provider about how to take care of your port insertion site. Make sure you: ? Wash your hands with soap and water before and after you change your bandage (dressing). If soap and water are not available, use hand sanitizer. ? Change your dressing as told by your health care provider. ? Leave stitches (sutures), skin glue, or adhesive strips in place. These skin closures may need to stay in place for 2 weeks or longer. If adhesive strip edges start to loosen and curl up, you may trim the loose edges. Do not remove adhesive strips completely unless your health care provider tells you to do that.  Check your port insertion site every day for signs of infection. Check for: ? Redness, swelling, or pain. ? Fluid or blood. ? Warmth. ? Pus or a bad smell.      Activity  Return to your normal activities as told by your health care provider. Ask your health care provider what activities are safe for you.  Do not  lift anything that is heavier than 10 lb (4.5 kg), or the limit that you are told, until your health care provider says that it is safe. General instructions  Take over-the-counter and prescription medicines only as told by your health care provider.  Do not take baths, swim, or use a hot tub until your health care provider approves. Ask your health care provider if you may take showers. You may only be allowed to take sponge baths.  Do not drive for 24 hours if you were given a sedative during your procedure.  Wear a medical alert bracelet in case of an emergency. This will tell any health care providers that you have a port.  Keep all follow-up visits as told by your health care provider. This is important. Contact a health care provider if:  You cannot flush your port with saline as directed, or you cannot draw blood from the port.  You have a fever or chills.  You have redness, swelling, or pain around your port insertion site.  You have fluid or blood coming from your port insertion site.  Your port insertion site feels warm to the touch.  You have pus or a bad smell coming from the port insertion site. Get help right away if:  You have chest pain or shortness of breath.  You have bleeding from your port that you cannot control. Summary  Take care of the port as told by your   health care provider. Keep the manufacturer's information card with you at all times.  Change your dressing as told by your health care provider.  Contact a health care provider if you have a fever or chills or if you have redness, swelling, or pain around your port insertion site.  Keep all follow-up visits as told by your health care provider. This information is not intended to replace advice given to you by your health care provider. Make sure you discuss any questions you have with your health care provider. Document Revised: 09/29/2017 Document Reviewed: 09/29/2017 Elsevier Patient Education   2021 [employer]vier Inc.  

## 2020-05-29 LAB — CA 125: Cancer Antigen (CA) 125: 9.5 U/mL (ref 0.0–38.1)

## 2020-05-30 ENCOUNTER — Other Ambulatory Visit: Payer: Medicare Other

## 2020-05-30 ENCOUNTER — Inpatient Hospital Stay (HOSPITAL_BASED_OUTPATIENT_CLINIC_OR_DEPARTMENT_OTHER): Payer: Medicare Other | Admitting: Hematology and Oncology

## 2020-05-30 ENCOUNTER — Encounter: Payer: Self-pay | Admitting: Hematology and Oncology

## 2020-05-30 ENCOUNTER — Telehealth: Payer: Self-pay | Admitting: Hematology and Oncology

## 2020-05-30 ENCOUNTER — Other Ambulatory Visit: Payer: Self-pay

## 2020-05-30 DIAGNOSIS — D638 Anemia in other chronic diseases classified elsewhere: Secondary | ICD-10-CM | POA: Insufficient documentation

## 2020-05-30 DIAGNOSIS — Z8589 Personal history of malignant neoplasm of other organs and systems: Secondary | ICD-10-CM | POA: Diagnosis not present

## 2020-05-30 DIAGNOSIS — R5381 Other malaise: Secondary | ICD-10-CM

## 2020-05-30 DIAGNOSIS — M810 Age-related osteoporosis without current pathological fracture: Secondary | ICD-10-CM | POA: Insufficient documentation

## 2020-05-30 DIAGNOSIS — C482 Malignant neoplasm of peritoneum, unspecified: Secondary | ICD-10-CM | POA: Diagnosis not present

## 2020-05-30 DIAGNOSIS — G629 Polyneuropathy, unspecified: Secondary | ICD-10-CM | POA: Diagnosis not present

## 2020-05-30 DIAGNOSIS — M797 Fibromyalgia: Secondary | ICD-10-CM | POA: Diagnosis not present

## 2020-05-30 NOTE — Assessment & Plan Note (Signed)
Clinically, she is doing well with no signs of disease Her tumor marker is within normal limits Her clinical exam is benign I reassured the patient today For now, I will schedule port flush every 6-8 weeks and blood work along with tumor marker every 12 weeks She will see GYN surgeon in 3 months and I will see her back in 6 months for further follow-up

## 2020-05-30 NOTE — Assessment & Plan Note (Signed)
She used to take risedronate for osteopenia prior to diagnosis of malignancy and chemotherapy but has not taken the treatment for a year She is up-to-date with taking vitamin D and calcium supplement She is not aware when her last vitamin D level was checked She showed me a copy of her recent bone density report from July 2011 which show slight progression from osteopenia to osteoporosis She has significant concern about using Prolia or Reclast as suggested by endocrinologist I have a very long discussion with the patient We discussed some of the basic management of osteopenia/osteoporosis including maximizing the dose of vitamin D and calcium along with low-dose magnesium as well as weightbearing exercises as tolerated We discussed the importance of getting dental check while undergoing bisphosphonate therapy due to risk of osteonecrosis of the jaw Ultimately, I support the patient's decision to remain on risedronate if she feels more comfortable being on it but I would defer the decision to her primary care doctor for further management

## 2020-05-30 NOTE — Progress Notes (Signed)
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, she is doing well with no signs of disease Her tumor marker is within normal limits Her clinical exam is benign I reassured the patient today For now, I will schedule port flush every 6-8 weeks and blood work along with tumor marker every 12 weeks She will see GYN surgeon in 3 months and I will see her back in 6 months for further follow-up   Anemia, chronic disease Despite discontinuation of chemotherapy, she remained anemic I suspect this is likely anemia of chronic illness related to her low body weight I do not believe her excessive fatigue is related to that She does not need further work-up for this  Osteoporosis She used to take risedronate for osteopenia prior to diagnosis of malignancy and chemotherapy but has not taken the treatment for a year She is up-to-date with taking vitamin D and calcium supplement She is not aware when her last vitamin D level was checked She showed me a copy of her recent bone density report from July 2011 which show slight progression from osteopenia to osteoporosis She has significant concern about using Prolia or Reclast as suggested by endocrinologist I have a very long discussion with the patient We discussed some of the basic management of osteopenia/osteoporosis including maximizing the dose of vitamin D and calcium along with low-dose magnesium as well as weightbearing exercises as tolerated We discussed the importance of getting dental check while undergoing bisphosphonate therapy due to risk of osteonecrosis of the jaw Ultimately, I support the patient's decision to remain on risedronate if she feels more comfortable being on it but I would defer the  decision to her primary care doctor for further management  Physical debility She is quite debilitated due to excessive poor energy She also have intermittent flare of her fibromyalgia The patient is taking relatively high dose of gabapentin that could potentially cause carryover effect with sedation The patient stated she does not sleep well at night I recommend consideration for changing the gabapentin dose to take bigger dose in the evening and lighter dose in the morning She will think about   No orders of the defined types were placed in this encounter.   All questions were answered. The patient knows to call the clinic with any problems, questions or concerns. The total time spent in the appointment was 40 minutes encounter with patients including review of chart and various tests results, discussions about plan of care and coordination of care plan   [name], MD 05/30/2020 3:26 PM  INTERVAL HISTORY: Please see below for problem oriented charting. She returns for further follow-up after completion of treatment for primary peritoneal carcinoma The patient have numerous questions and symptoms since last time I saw her She has excessive fatigue, weakness, poor energy along with poor sleep She had recent flare of neuropathy She noticed some slight change in her recent bowel habits but denies bloating, vaginal bleeding or abdominal pain She is concerned about recent findings of her bone density scan in July 2011 which show slight progression from osteopenia to osteoporosis She was recommended to see an endocrinologist who suggested her to take Reclast She has research on side effects of Reclast and Prolia and overall are not enthusiastic to proceed with either  treatment She is wondering whether it might be appropriate to consider going back to risedronate She denies dental pain but does have some dental issues requiring root canal and further work-up with her dentist She denies new  bone pain  SUMMARY OF ONCOLOGIC HISTORY: Oncology History Overview Note  Negative genetics High grade serous   Peritoneal carcinoma ([place])  11/23/2018 Imaging   Ct abdomen and pelvis 1. Findings highly suspicious for peritoneal carcinomatosis. Recommend paracentesis for therapeutic and diagnostic purposes. I do not see an obvious primary lesion but there is some irregular enhancing soft tissue in the right adnexal area. CA 125 level may be helpful. 2. Mild surface irregularity involving the liver but I do not see any obvious changes of cirrhosis. No hepatic lesions.   11/23/2018 Tumor Marker   Patient's tumor was tested for the following markers: CA-125 Results of the tumor marker test revealed 253.   11/25/2018 Initial Diagnosis   Peritoneal carcinoma ([place])   11/25/2018 Imaging   US paracentesis Successful ultrasound-guided paracentesis yielding 3.4 L of peritoneal fluid.   12/01/2018 Cancer Staging   Staging form: Ovary, Fallopian Tube, and Primary Peritoneal Carcinoma, AJCC 8th Edition - Clinical stage from 12/01/2018: cT3, cN0, cM0 - Signed by [name], MD on 12/01/2018   12/02/2018 Tumor Marker   Patient's tumor was tested for the following markers: CA-125 Results of the tumor marker test revealed 267   12/06/2018 - 05/16/2019 Chemotherapy   The patient had carboplatin and taxol for chemotherapy treatment.     12/08/2018 Procedure   Successful ultrasound-guided therapeutic paracentesis yielding 1.7 liters of peritoneal fluid.   12/24/2018 Procedure   Successful placement of a right IJ approach Power Port with ultrasound and fluoroscopic guidance. The catheter is ready for use   01/17/2019 Tumor Marker   Patient's tumor was tested for the following markers: CA-125 Results of the tumor marker test revealed 57.2   01/31/2019 Imaging   Significant decrease in peritoneal carcinomatosis since previous study. Interval resolution of ascites.   Focal area of parenchymal consolidation  in central right middle lobe, which measures 3 cm. Differential diagnosis includes infectious or inflammatory process, atelectasis, and neoplasm. Recommend short-term follow-up by chest CT in 2-3 months.     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   Surgeon: Donaciano Eva    Operative Findings:  : grossly normal uterus, ovaries normal, few scattered peritoneal nodules (40m) on serosa of uterus and tubes. The omentum (gastrocolic) was tethered to the mesentery with tumor (thin rind). Complete (optimal) resection of tumor with no gross residual disease.     02/17/2019 Pathology Results   FINAL MICROSCOPIC DIAGNOSIS: A. UTERUS, CERVIX, BILATERAL FALLOPIAN TUBES AND OVARIES, HYSTERECTOMY WITH SALPINGOOOPHORECTOMY: - Uterus: Endometrium: Inactive endometrium. No hyperplasia or malignancy. Myometrium: Unremarkable. No malignancy. Serosa: Metastatic carcinoma. No malignancy. - Cervix: Benign squamous and endocervical mucosa. No dysplasia or malignancy. - Left ovary and fallopian tube: Metastatic carcinoma. - Right ovary: No malignancy identified. - Right fallopian tube: Luminal tumor, see comment. B. OMENTUM, RESECTION: - High grade serous carcinoma. - Deposits up to at least 3 cm. - See oncology table. ONCOLOGY TABLE: OVARY or FALLOPIAN TUBE or PRIMARY PERITONEUM: Procedure: Hysterectomy with bilateral salpingo-oophorectomy and omentectomy. Specimen Integrity: Intact Tumor Site: Peritoneum Ovarian Surface Involvement (required only if applicable): Left ovary Fallopian Tube Surface Involvement (  required only if applicable): Left Fallopian tube Tumor Size: Largest deposit 3 cm Histologic Type: High-grade serous carcinoma Histologic Grade:  High-grade Implants (required for advanced stage serous/seromucinous borderline tumors only): Uterine serosa, left fallopian tube and ovary, omentum Other Tissue/ Organ Involvement: As above Largest Extrapelvic Peritoneal Focus (required only if applicable): 3 cm Peritoneal/Ascitic Fluid: Pre neoadjuvant [id number] positive for carcinoma. Treatment Effect (required only for high-grade serous carcinomas): Probably partial treatment effect in omental tissue. Regional Lymph Nodes: No lymph nodes submitted or found Pathologic Stage Classification (pTNM, AJCC 8th Edition): ypT3c, ypNX Representative Tumor Block: B5 Comment(s): There is surface involvement of the left ovary and fallopian tube but no primary tumor. Within the right fallopian tube lumen there is detached fragments of tumor but again no precursor lesion is noted in the right fallopian tube. Thus, the tumor is presumed primary peritoneal and staged as such.   03/21/2019 Tumor Marker   Patient's tumor was tested for the following markers: CA-125 Results of the tumor marker test revealed 15.1    Genetic Testing   Negative genetic testing. No pathogenic variants identified on the [facility] CancerNext + RNAinsight panel. VUS in ATM called [id number] identified. The report date is 05/12/2019. TumorNext HRD was originally ordered but there was not enough sample to complete this testing.   The CancerNext+RNAinsight gene panel offered by [facility] includes sequencing and rearrangement analysis for the following 36 genes: APC*, ATM*, AXIN2, BARD1, BMPR1A, BRCA1*, BRCA2*, BRIP1*, CDH1*, CDK4, CDKN2A, CHEK2*, DICER1, MLH1*, MSH2*, MSH3, MSH6*, MUTYH*, NBN, NF1*, NTHL1, PALB2*, PMS2*, PTEN*, RAD51C*, RAD51D*, RECQL, SMAD4, SMARCA4, STK11 and TP53* (sequencing and deletion/duplication); HOXB13, POLD1 and POLE (sequencing only); EPCAM and GREM1 (deletion/duplication only). DNA and RNA analyses performed for * genes.    05/16/2019 Tumor Marker   Patient's  tumor was tested for the following markers: CA-125 Results of the tumor marker test revealed 10.7   06/16/2019 Imaging   1. No evidence of residual or recurrent metastatic disease in the chest, abdomen or pelvis. 2. Masslike focus of consolidation in the right middle lobe along the major and minor fissures with associated volume loss is stable since 01/31/2019. Indolent primary pulmonary neoplasm not excluded. PET-CT may be considered for further characterization. 3. Aortic Atherosclerosis (ICD10-I70.0).   06/16/2019 Tumor Marker   Patient's tumor was tested for the following markers: CA-125 Results of the tumor marker test revealed 9.1.   12/05/2019 Tumor Marker   Patient's tumor was tested for the following markers: CA-125. Results of the tumor marker test revealed 9.0.   02/29/2020 Tumor Marker   Patient's tumor was tested for the following markers: CA-`125 Results of the tumor marker test revealed 9.7.   05/28/2020 Tumor Marker   Patient's tumor was tested for the following markers: CA-125 Results of the tumor marker test revealed 9.5     REVIEW OF SYSTEMS:   Constitutional: Denies fevers, chills or abnormal weight loss Eyes: Denies blurriness of vision Ears, nose, mouth, throat, and face: Denies mucositis or sore throat Respiratory: Denies cough, dyspnea or wheezes Cardiovascular: Denies palpitation, chest discomfort or lower extremity swelling Skin: Denies abnormal skin rashes Lymphatics: Denies new lymphadenopathy or easy bruising Neurological:Denies numbness, tingling or new weaknesses Behavioral/Psych: Mood is stable, no new changes  All other systems were reviewed with the patient and are negative.  I have reviewed the past medical history, past surgical history, social history and family history with the patient and they are unchanged from previous note.  ALLERGIES:  is allergic to fentanyl, milk-related compounds,  other, oxybutynin chloride, thimerosal, tramadol, avelox  [moxifloxacin hcl in nacl], doxycycline, erythromycin, metronidazole, minocycline, penicillins, quinolones, and sulfonamide derivatives.  MEDICATIONS:  Current Outpatient Medications  Medication Sig Dispense Refill  . acetaminophen (TYLENOL) 500 MG tablet Take 500 mg by mouth every 6 (six) hours as needed for moderate pain or headache.    . ARTIFICIAL TEAR SOLUTION OP Place 1 drop into both eyes daily as needed (dry eyes).    . Azelaic Acid 15 % cream Apply 1 application topically daily.    . cholecalciferol (VITAMIN D3) 25 MCG (1000 UNIT) tablet Take 2,000 Units by mouth daily.    . cromolyn (NASALCROM) 5.2 MG/ACT nasal spray Place 1 spray into the nose as needed for allergies.     Marland Kitchen gabapentin (NEURONTIN) 300 MG capsule Take 600 mg by mouth 3 (three) times daily.     Marland Kitchen Ketotifen Fumarate (ALLERGY EYE DROPS OP) Place 1 drop into both eyes daily as needed (allergies).    Marland Kitchen lidocaine-prilocaine (EMLA) cream Apply 1 application topically daily as needed (port access). 30 g 3  . loratadine (CLARITIN) 5 MG/5ML syrup Take 5 mg by mouth daily as needed for allergies (bone pain related to chemo).    . Sulfacetamide Sodium, Acne, 10 % LOTN Apply 1 application topically at bedtime.    . vitamin E 1000 UNIT capsule Take 1,800 Units by mouth daily.     No current facility-administered medications for this visit.    PHYSICAL EXAMINATION: ECOG PERFORMANCE STATUS: 1 - Symptomatic but completely ambulatory  Vitals:   05/30/20 1049  BP: (!) 134/57  Pulse: 89  Resp: 17  Temp: 98.5 F (36.9 C)  SpO2: 99%   Filed Weights   05/30/20 1049  Weight: 103 lb 3.2 oz (46.8 kg)    GENERAL:alert, no distress and comfortable.  She looks thin SKIN: skin color, texture, turgor are normal, no rashes or significant lesions EYES: normal, Conjunctiva are pink and non-injected, sclera clear OROPHARYNX:no exudate, no erythema and lips, buccal mucosa, and tongue normal  NECK: supple, thyroid normal size,  non-tender, without nodularity LYMPH:  no palpable lymphadenopathy in the cervical, axillary or inguinal LUNGS: clear to auscultation and percussion with normal breathing effort HEART: regular rate & rhythm and no murmurs and no lower extremity edema ABDOMEN:abdomen soft, non-tender and normal bowel sounds.  Well-healed surgical scar with no other abnormalities Musculoskeletal:no cyanosis of digits and no clubbing  NEURO: alert & oriented x 3 with fluent speech, no focal motor/sensory deficits  LABORATORY DATA:  I have reviewed the data as listed    Component Value Date/Time   NA 141 05/28/2020 1010   K 3.8 05/28/2020 1010   CL 106 05/28/2020 1010   CO2 27 05/28/2020 1010   GLUCOSE 84 05/28/2020 1010   BUN 10 05/28/2020 1010   CREATININE 0.67 05/28/2020 1010   CALCIUM 9.2 05/28/2020 1010   PROT 7.2 05/28/2020 1010   ALBUMIN 4.3 05/28/2020 1010   AST 22 05/28/2020 1010   ALT 17 05/28/2020 1010   ALKPHOS 56 05/28/2020 1010   BILITOT 0.6 05/28/2020 1010   GFRNONAA >60 05/28/2020 1010   GFRAA >60 12/05/2019 1145    No results found for: SPEP, UPEP  Lab Results  Component Value Date   WBC 5.8 05/28/2020   NEUTROABS 3.3 05/28/2020   HGB 11.5 (L) 05/28/2020   HCT 34.6 (L) 05/28/2020   MCV 91.5 05/28/2020   PLT 210 05/28/2020      Chemistry      Component  Value Date/Time   NA 141 05/28/2020 1010   K 3.8 05/28/2020 1010   CL 106 05/28/2020 1010   CO2 27 05/28/2020 1010   BUN 10 05/28/2020 1010   CREATININE 0.67 05/28/2020 1010      Component Value Date/Time   CALCIUM 9.2 05/28/2020 1010   ALKPHOS 56 05/28/2020 1010   AST 22 05/28/2020 1010   ALT 17 05/28/2020 1010   BILITOT 0.6 05/28/2020 1010

## 2020-05-30 NOTE — Assessment & Plan Note (Signed)
She is quite debilitated due to excessive poor energy She also have intermittent flare of her fibromyalgia The patient is taking relatively high dose of gabapentin that could potentially cause carryover effect with sedation The patient stated she does not sleep well at night I recommend consideration for changing the gabapentin dose to take bigger dose in the evening and lighter dose in the morning She will think about

## 2020-05-30 NOTE — Telephone Encounter (Signed)
Scheduled appointments per 3/16 los. Spoke to patient who is aware of appointments dates and times.

## 2020-05-30 NOTE — Assessment & Plan Note (Signed)
Despite discontinuation of chemotherapy, she remained anemic I suspect this is likely anemia of chronic illness related to her low body weight I do not believe her excessive fatigue is related to that She does not need further work-up for this

## 2020-06-04 ENCOUNTER — Encounter: Payer: Self-pay | Admitting: Hematology and Oncology

## 2020-07-10 ENCOUNTER — Inpatient Hospital Stay: Payer: Medicare Other | Attending: Gynecologic Oncology

## 2020-07-10 ENCOUNTER — Other Ambulatory Visit: Payer: Self-pay

## 2020-07-10 DIAGNOSIS — Z8589 Personal history of malignant neoplasm of other organs and systems: Secondary | ICD-10-CM | POA: Insufficient documentation

## 2020-07-10 DIAGNOSIS — C482 Malignant neoplasm of peritoneum, unspecified: Secondary | ICD-10-CM

## 2020-07-10 DIAGNOSIS — Z452 Encounter for adjustment and management of vascular access device: Secondary | ICD-10-CM | POA: Diagnosis not present

## 2020-07-10 MED ORDER — HEPARIN SOD (PORK) LOCK FLUSH 100 UNIT/ML IV SOLN
500.0000 [IU] | Freq: Once | INTRAVENOUS | Status: AC
  Administered 2020-07-10: 500 [IU]
  Filled 2020-07-10: qty 5

## 2020-07-10 MED ORDER — SODIUM CHLORIDE 0.9% FLUSH
10.0000 mL | Freq: Once | INTRAVENOUS | Status: AC
  Administered 2020-07-10: 10 mL
  Filled 2020-07-10: qty 10

## 2020-07-10 NOTE — Patient Instructions (Signed)
Implanted Port Insertion, Care After This sheet gives you information about how to care for yourself after your procedure. Your health care provider may also give you more specific instructions. If you have problems or questions, contact your health care provider. What can I expect after the procedure? After the procedure, it is common to have:  Discomfort at the port insertion site.  Bruising on the skin over the port. This should improve over 3-4 days. Follow these instructions at home: Port care  After your port is placed, you will get a manufacturer's information card. The card has information about your port. Keep this card with you at all times.  Take care of the port as told by your health care provider. Ask your health care provider if you or a family member can get training for taking care of the port at home. A home health care nurse may also take care of the port.  Make sure to remember what type of port you have. Incision care  Follow instructions from your health care provider about how to take care of your port insertion site. Make sure you: ? Wash your hands with soap and water before and after you change your bandage (dressing). If soap and water are not available, use hand sanitizer. ? Change your dressing as told by your health care provider. ? Leave stitches (sutures), skin glue, or adhesive strips in place. These skin closures may need to stay in place for 2 weeks or longer. If adhesive strip edges start to loosen and curl up, you may trim the loose edges. Do not remove adhesive strips completely unless your health care provider tells you to do that.  Check your port insertion site every day for signs of infection. Check for: ? Redness, swelling, or pain. ? Fluid or blood. ? Warmth. ? Pus or a bad smell.      Activity  Return to your normal activities as told by your health care provider. Ask your health care provider what activities are safe for you.  Do not  lift anything that is heavier than 10 lb (4.5 kg), or the limit that you are told, until your health care provider says that it is safe. General instructions  Take over-the-counter and prescription medicines only as told by your health care provider.  Do not take baths, swim, or use a hot tub until your health care provider approves. Ask your health care provider if you may take showers. You may only be allowed to take sponge baths.  Do not drive for 24 hours if you were given a sedative during your procedure.  Wear a medical alert bracelet in case of an emergency. This will tell any health care providers that you have a port.  Keep all follow-up visits as told by your health care provider. This is important. Contact a health care provider if:  You cannot flush your port with saline as directed, or you cannot draw blood from the port.  You have a fever or chills.  You have redness, swelling, or pain around your port insertion site.  You have fluid or blood coming from your port insertion site.  Your port insertion site feels warm to the touch.  You have pus or a bad smell coming from the port insertion site. Get help right away if:  You have chest pain or shortness of breath.  You have bleeding from your port that you cannot control. Summary  Take care of the port as told by your   health care provider. Keep the manufacturer's information card with you at all times.  Change your dressing as told by your health care provider.  Contact a health care provider if you have a fever or chills or if you have redness, swelling, or pain around your port insertion site.  Keep all follow-up visits as told by your health care provider. This information is not intended to replace advice given to you by your health care provider. Make sure you discuss any questions you have with your health care provider. Document Revised: 09/29/2017 Document Reviewed: 09/29/2017 Elsevier Patient Education   2021 [employer]vier Inc.  

## 2020-07-20 ENCOUNTER — Encounter: Payer: Self-pay | Admitting: Hematology and Oncology

## 2020-08-01 DIAGNOSIS — C482 Malignant neoplasm of peritoneum, unspecified: Secondary | ICD-10-CM | POA: Diagnosis not present

## 2020-08-01 DIAGNOSIS — G8929 Other chronic pain: Secondary | ICD-10-CM | POA: Diagnosis not present

## 2020-08-01 DIAGNOSIS — M810 Age-related osteoporosis without current pathological fracture: Secondary | ICD-10-CM | POA: Diagnosis not present

## 2020-08-10 DIAGNOSIS — Z23 Encounter for immunization: Secondary | ICD-10-CM | POA: Diagnosis not present

## 2020-08-14 ENCOUNTER — Other Ambulatory Visit (HOSPITAL_COMMUNITY): Payer: Self-pay

## 2020-08-14 ENCOUNTER — Other Ambulatory Visit: Payer: Medicare Other

## 2020-08-14 MED FILL — Lidocaine-Prilocaine Cream 2.5-2.5%: CUTANEOUS | 30 days supply | Qty: 30 | Fill #0 | Status: AC

## 2020-08-16 ENCOUNTER — Other Ambulatory Visit: Payer: Self-pay

## 2020-08-16 ENCOUNTER — Inpatient Hospital Stay: Payer: Medicare Other | Attending: Gynecologic Oncology

## 2020-08-16 ENCOUNTER — Other Ambulatory Visit: Payer: Medicare Other

## 2020-08-16 DIAGNOSIS — Z8544 Personal history of malignant neoplasm of other female genital organs: Secondary | ICD-10-CM | POA: Diagnosis not present

## 2020-08-16 DIAGNOSIS — I493 Ventricular premature depolarization: Secondary | ICD-10-CM | POA: Diagnosis not present

## 2020-08-16 DIAGNOSIS — R011 Cardiac murmur, unspecified: Secondary | ICD-10-CM | POA: Diagnosis not present

## 2020-08-16 DIAGNOSIS — R6881 Early satiety: Secondary | ICD-10-CM | POA: Insufficient documentation

## 2020-08-16 DIAGNOSIS — C482 Malignant neoplasm of peritoneum, unspecified: Secondary | ICD-10-CM

## 2020-08-16 DIAGNOSIS — M810 Age-related osteoporosis without current pathological fracture: Secondary | ICD-10-CM | POA: Insufficient documentation

## 2020-08-16 DIAGNOSIS — Z9221 Personal history of antineoplastic chemotherapy: Secondary | ICD-10-CM | POA: Diagnosis not present

## 2020-08-16 DIAGNOSIS — K219 Gastro-esophageal reflux disease without esophagitis: Secondary | ICD-10-CM | POA: Insufficient documentation

## 2020-08-16 DIAGNOSIS — Z90722 Acquired absence of ovaries, bilateral: Secondary | ICD-10-CM | POA: Diagnosis not present

## 2020-08-16 DIAGNOSIS — R5383 Other fatigue: Secondary | ICD-10-CM | POA: Insufficient documentation

## 2020-08-16 DIAGNOSIS — R197 Diarrhea, unspecified: Secondary | ICD-10-CM | POA: Diagnosis not present

## 2020-08-16 DIAGNOSIS — Z90710 Acquired absence of both cervix and uterus: Secondary | ICD-10-CM | POA: Diagnosis not present

## 2020-08-16 DIAGNOSIS — M797 Fibromyalgia: Secondary | ICD-10-CM | POA: Diagnosis not present

## 2020-08-16 DIAGNOSIS — L719 Rosacea, unspecified: Secondary | ICD-10-CM | POA: Diagnosis not present

## 2020-08-16 DIAGNOSIS — R140 Abdominal distension (gaseous): Secondary | ICD-10-CM | POA: Insufficient documentation

## 2020-08-16 LAB — CMP (CANCER CENTER ONLY)
ALT: 25 U/L (ref 0–44)
AST: 30 U/L (ref 15–41)
Albumin: 4.2 g/dL (ref 3.5–5.0)
Alkaline Phosphatase: 62 U/L (ref 38–126)
Anion gap: 10 (ref 5–15)
BUN: 14 mg/dL (ref 8–23)
CO2: 27 mmol/L (ref 22–32)
Calcium: 9.6 mg/dL (ref 8.9–10.3)
Chloride: 103 mmol/L (ref 98–111)
Creatinine: 0.65 mg/dL (ref 0.44–1.00)
GFR, Estimated: >60 mL/min (ref >60)
Glucose, Bld: 85 mg/dL (ref 70–99)
Potassium: 3.7 mmol/L (ref 3.5–5.1)
Sodium: 140 mmol/L (ref 135–145)
Total Bilirubin: 0.5 mg/dL (ref 0.3–1.2)
Total Protein: 7.3 g/dL (ref 6.5–8.1)

## 2020-08-16 LAB — CBC WITH DIFFERENTIAL (CANCER CENTER ONLY)
Abs Immature Granulocytes: 0.01 10*3/uL (ref 0.00–0.07)
Basophils Absolute: 0 10*3/uL (ref 0.0–0.1)
Basophils Relative: 1 %
Eosinophils Absolute: 0.1 10*3/uL (ref 0.0–0.5)
Eosinophils Relative: 2 %
HCT: 35.7 % — ABNORMAL LOW (ref 36.0–46.0)
Hemoglobin: 12 g/dL (ref 12.0–15.0)
Immature Granulocytes: 0 %
Lymphocytes Relative: 45 %
Lymphs Abs: 2.2 10*3/uL (ref 0.7–4.0)
MCH: 30.6 pg (ref 26.0–34.0)
MCHC: 33.6 g/dL (ref 30.0–36.0)
MCV: 91.1 fL (ref 80.0–100.0)
Monocytes Absolute: 0.4 10*3/uL (ref 0.1–1.0)
Monocytes Relative: 8 %
Neutro Abs: 2.1 10*3/uL (ref 1.7–7.7)
Neutrophils Relative %: 44 %
Platelet Count: 231 10*3/uL (ref 150–400)
RBC: 3.92 MIL/uL (ref 3.87–5.11)
RDW: 13.1 % (ref 11.5–15.5)
WBC Count: 4.8 10*3/uL (ref 4.0–10.5)
nRBC: 0 % (ref 0.0–0.2)

## 2020-08-16 MED ORDER — SODIUM CHLORIDE 0.9% FLUSH
10.0000 mL | Freq: Once | INTRAVENOUS | Status: AC
  Administered 2020-08-16: 10 mL
  Filled 2020-08-16: qty 10

## 2020-08-16 MED ORDER — HEPARIN SOD (PORK) LOCK FLUSH 100 UNIT/ML IV SOLN
500.0000 [IU] | Freq: Once | INTRAVENOUS | Status: AC
  Administered 2020-08-16: 500 [IU]
  Filled 2020-08-16: qty 5

## 2020-08-17 ENCOUNTER — Ambulatory Visit: Payer: Medicare Other | Admitting: Gynecologic Oncology

## 2020-08-17 ENCOUNTER — Encounter: Payer: Self-pay | Admitting: Gynecologic Oncology

## 2020-08-17 ENCOUNTER — Telehealth: Payer: Self-pay

## 2020-08-17 LAB — CA 125: Cancer Antigen (CA) 125: 10.9 U/mL (ref 0.0–38.1)

## 2020-08-17 NOTE — Telephone Encounter (Signed)
Called and given below message. She verbralzled understanding.

## 2020-08-17 NOTE — Telephone Encounter (Signed)
-----   Message from [name], MD sent at 08/17/2020  8:56 AM EDT ----- Regarding: non urgent Pls call and let her know labs and CA-125 is normal

## 2020-08-20 ENCOUNTER — Inpatient Hospital Stay (HOSPITAL_BASED_OUTPATIENT_CLINIC_OR_DEPARTMENT_OTHER): Payer: Medicare Other | Admitting: Gynecologic Oncology

## 2020-08-20 ENCOUNTER — Other Ambulatory Visit: Payer: Self-pay

## 2020-08-20 VITALS — BP 148/68 | HR 89 | Temp 97.7°F | Resp 18 | Ht 65.0 in | Wt 103.1 lb

## 2020-08-20 DIAGNOSIS — Z90710 Acquired absence of both cervix and uterus: Secondary | ICD-10-CM | POA: Diagnosis not present

## 2020-08-20 DIAGNOSIS — C482 Malignant neoplasm of peritoneum, unspecified: Secondary | ICD-10-CM

## 2020-08-20 DIAGNOSIS — R5383 Other fatigue: Secondary | ICD-10-CM | POA: Diagnosis not present

## 2020-08-20 DIAGNOSIS — Z8544 Personal history of malignant neoplasm of other female genital organs: Secondary | ICD-10-CM

## 2020-08-20 DIAGNOSIS — Z9221 Personal history of antineoplastic chemotherapy: Secondary | ICD-10-CM | POA: Diagnosis not present

## 2020-08-20 DIAGNOSIS — M797 Fibromyalgia: Secondary | ICD-10-CM | POA: Diagnosis not present

## 2020-08-20 DIAGNOSIS — Z90722 Acquired absence of ovaries, bilateral: Secondary | ICD-10-CM | POA: Diagnosis not present

## 2020-08-20 NOTE — Patient Instructions (Signed)
Please notify Dr Denman George at phone number [phone] if you notice vaginal bleeding, new pelvic or abdominal pains, bloating, feeling full easy, or a change in bladder or bowel function.   Please return to see Dr Alvy Bimler in 3 months and then return to see Dr Denman George in December.

## 2020-08-20 NOTE — Progress Notes (Signed)
Follow-up Note: Gyn-Onc  Consult was initially requested by Dr. Olen Pel for the evaluation of [name] [name] 70 y.o. female  Chief Complaint  Patient presents with  . Peritoneal carcinoma [facility])    Assessment/Plan:  [name] [name] is a 70 y.o. 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 Therapy completed April, 2021. Complete clinical response.  She desires annual speculum examination in December due to her sister's history of vulvar melanoma.  She will see Dr Alvy Bimler in 17month with CA 125 and myself in 6 months.  She can transition to 6 monthly exams in April, 2023.   HPI: Ms. [name] RIGAis a 70y.o.   nulliparous female who reported menopause in her late 525s  She reported a 6-year history of fibromyalgia.  She was in her usual state of health until the spring/summer of 2020 when she noticed increased abdominal girth and some dyspnea upon exertion.  After Friday, September 4 she noted poor appetite and a rather significant increase in the abdominal distention.  She reported heartburn and intermittent nausea.  There had been no rectal or vaginal bleeding but she did report intermittent diarrhea.  [name] WISDOMwas evaluated  in the ED for the complaint of abdominal distention on November 23, 2018.  A CT scan was notable for a slightly irregular liver contour.  Significant perihepatic ascites.  There was vague ill-defined enhancing appearing peritoneal lesions in the mesentery worrisome for peritoneal carcinomatosis.  The uterus and left ovary appeared normal but there was an ill-defined irregular enhancing soft tissue noted in the region of the right adnexa without discrete masses.  Large volume abdominal pelvic ascites was noted.  Ca1 25 was collected and returned at a value of 253.  She underwent [place] paracentesis on 11/25/18 which showed metastatic adenocarcinoma  with the immunostains favoring a gynecologic primary.  She was diagnosed with stage 3C cancer and was recommended neoadjuvant chemotherapy for 3 cycles followed by interval cytoreductive surgery.   Neoadjuvant chemotherapy with carboplatin and paclitaxel began on 12/06/2018.  Day 1 of cycle 3 was administered on 01/17/2019. CA 125 on 01/17/19 was 57.2.  CT abd/pelvis on 01/31/19 showed significant decrease in peritoneal carcinomatosis since previous study. Interval resolution of ascites. Focal area of parenchymal consolidation in central right middle lobe, which measures 3 cm. Differential diagnosis includes infectious or inflammatory process, atelectasis, and neoplasm.   On February 17, 2019 she underwent interval debulking with a robotic assisted total hysterectomy with BSO, omentectomy, radical tumor debulking, mini laparotomy for omentectomy. Intraoperative findings were significant for a grossly normal uterus, normal ovaries, few scattered peritoneal nodules 1 mm on the serosa of the uterus and tubes.  The omentum and the gastrocolic region was tethered to the mesentery with tumor.  Surgery included a complete optimal resection with no gross residual disease at the end of the procedure. Surgery was uncomplicated.  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  There were metastatic implants on the serosa of the uterus, ovaries, and fallopian tubes in addition to metastatic foci greater than 2 cm in the omentum.  Somatic and germline BRCA and HRD testing was negative.   She received 3 additional cycles of carboplatin and paclitaxel completed in March, 2021.   CT imaging on 06/16/19 showed complete  clinical response (stable pulmonary nodule).  CA 125 normalized to 9.1 on 06/16/19 CA 125 remained normal at 7.4 on 09/09/19. CA 125 was normal at 9.0 on  12/05/19. CA 125 remained normal at 9.7 on 02/29/20.  Interval Hx CA 125 was 10.9 on 08/16/20.  She has many positive reviews of symptoms: fatigue, GI changes (stable, not persistent) including diarrhea and bloating and early satiety. She also has neuropathy.   Review of Systems: Constitutional Feels fatigued Cardiovascular  No chest pain, shortness of breath, or edema  Pulmonary  No cough or wheeze.  Gastro Intestinal  Intermittent diarrhea, intermittent bloating and early satiety (no escalation or new symptoms).  Genito Urinary  No frequency, urgency, dysuria, no vaginal bleeding musculo Skeletal  Intermittent myalgia, arthralgia, joint swelling and pain with flareups of fibromyalgia neurologic , + neuropathy toes No weakness, numbness, change in gait,  Psychology  No depression, anxiety, insomnia.    Current Meds:  Outpatient Encounter Medications as of 08/20/2020  Medication Sig  . ARTIFICIAL TEAR SOLUTION OP Place 1 drop into both eyes daily as needed (dry eyes).  . Azelaic Acid 15 % cream Apply 1 application topically daily.  . cholecalciferol (VITAMIN D3) 25 MCG (1000 UNIT) tablet Take 2,000 Units by mouth daily.  . cromolyn (NASALCROM) 5.2 MG/ACT nasal spray Place 1 spray into the nose as needed for allergies.   Marland Kitchen gabapentin (NEURONTIN) 300 MG capsule Take 300 mg by mouth. Taking 600 mg in am ;300 mg at lunch; 300 mg at hs.  . Ketotifen Fumarate (ALLERGY EYE DROPS OP) Place 1 drop into both eyes daily as needed (allergies).  Marland Kitchen lidocaine-prilocaine (EMLA) cream APPLY TOPICALLY AS NEEDED (PORT ACCESS).  Marland Kitchen loratadine (CLARITIN) 5 MG/5ML syrup Take 5 mg by mouth daily as needed for allergies (bone pain related to chemo).  . Probiotic Product (PROBIOTIC DAILY PO) Take by mouth.  . Sulfacetamide Sodium, Acne, 10 % LOTN Apply 1 application topically at bedtime.  Marland Kitchen VITAMIN E PO Take 50 Int'l Units/day by mouth daily.  . [DISCONTINUED] acetaminophen (TYLENOL) 500 MG tablet Take 500 mg  by mouth every 6 (six) hours as needed for moderate pain or headache.  . [DISCONTINUED] vitamin E 1000 UNIT capsule Take 1,800 Units by mouth daily.   No facility-administered encounter medications on file as of 08/20/2020.    Allergy:  Allergies  Allergen Reactions  . Fentanyl Palpitations    Palpitations , fever ,headahe, N/V  " loud pounding heart beat"   . Lac Bovis Diarrhea and Nausea And Vomiting  . Brimonidine Tartrate     Other reaction(s): blurred vision  . Milk-Related Compounds Diarrhea and Nausea And Vomiting  . Other Other (See Comments)  . Oxybutynin Chloride Other (See Comments)    Ditropan  Pt doesn't remember   . Thimerosal Other (See Comments)    Pt doesn't remember  . Tramadol Other (See Comments)    Pt doesn't remember  . Avelox [Moxifloxacin Hcl In Nacl] Rash  . Doxycycline Rash  . Erythromycin Rash  . Metronidazole Rash  . Minocycline Rash  . Penicillins Rash    Did it involve swelling of the face/tongue/throat, SOB, or low BP? No Did it involve sudden or severe rash/hives, skin peeling, or any reaction on the inside of your mouth or nose? No Did you need to seek medical attention at a hospital or doctor's office? No When did it last happen?Childhood allergy If all above answers are "NO", may proceed with cephalosporin use.   . Quinolones  Rash  . Sulfonamide Derivatives Rash    Social Hx:   Social History   Socioeconomic History  . Marital status: Married    Spouse name: Antony Haste  . Number of children: 0  . Years of education: Not on file  . Highest education level: Not on file  Occupational History  . Occupation: Teacher, early years/pre: [employer]  Tobacco Use  . Smoking status: Never Smoker  . Smokeless tobacco: Never Used  Vaping Use  . Vaping Use: Never used  Substance and Sexual Activity  . Alcohol use: Yes    Alcohol/week: 2.0 standard drinks    Types: 2 Standard drinks or equivalent per week    Comment: Social   .  Drug use: No  . Sexual activity: Not Currently    Birth control/protection: Post-menopausal    Comment: 1st intercourse 70 yo- More than 5 partners  Other Topics Concern  . Not on file  Social History Narrative  . Not on file   Social Determinants of Health   Financial Resource Strain: Not on file  Food Insecurity: Not on file  Transportation Needs: Not on file  Physical Activity: Not on file  Stress: Not on file  Social Connections: Not on file  Intimate Partner Violence: Not on file    Past Surgical Hx:  Past Surgical History:  Procedure Laterality Date  . BREAST LUMPECTOMY  1998   right breast with radiation  . CERVICAL DISCECTOMY     C3-7 with bone graft, in three different surgeries, have titanium plate and screws  . GYNECOLOGIC CRYOSURGERY  1980  . IR IMAGING GUIDED PORT INSERTION  12/24/2018  . NECK SURGERY  [phone]   bone graft and fusion  . REFRACTIVE SURGERY     narrow angle glucoma  . RHINOPLASTY  1976  . TUBAL LIGATION  1977    Past Medical Hx:  Past Medical History:  Diagnosis Date  . Allergic rhinitis   . Cervical dysplasia 1980  . Cervicalgia   . Eczema    Rosacea,dermatitis-Dr [employer]  . Esophageal reflux   . Family history of colon cancer   . Family history of melanoma   . Family history of ovarian cancer   . Family history of pancreatic cancer   . Family history of prostate cancer   . Family history of uterine cancer   . Fibromyalgia   . GERD (gastroesophageal reflux disease)   . Glaucoma, narrow-angle   . Heart murmur   . Hiatal hernia   . Irritable bowel syndrome   . LBBB (left bundle branch block)   . Left bundle branch block   . 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 Mitral valve disorders(424.0)    No antibiotics required  . Osteoporosis 12/2016   T score -2.3, 2014 T score -2.5 AP spine  . PAC (premature atrial contraction) 2012    ,PVC's, and nonsustained atril tachycardia w aberration by heart monitor   . PVC (premature ventricular contraction)   . Rosacea   . Stricture and stenosis of esophagus     Past Gynecological History: Menarche at age 74 menopause late 5s nulliparous bilateral tubal ligation in her 62s.  History of abnormal Pap test treated with cryotherapy negative Pap test since  Family Hx:  Family History  Problem Relation Age of Onset  . Heart disease Mother   . Depression  Mother   . Hypertension Mother   . Heart disease Father   . Hypertension Father   . Aneurysm Father   . Uterine cancer Sister 53  . Hypertension Sister   . Ovarian cancer Sister 18  . Melanoma Sister 15  . Depression Sister   . Melanoma Sister 52  . Heart disease Paternal Grandfather   . Stroke Maternal Grandmother   . Cancer Maternal Grandfather 93       Pancreatic  . Stroke Paternal Grandmother   . Cancer Paternal Uncle        multiple myeloma, prostate cancer and melanoma (separate primaries)  . Colon cancer Neg Hx   Sister #1 ovarian cancer diagnosed at age 18 vulvar melanoma diagnosed at age 59 Sister #2 melanoma in situ Sister #3 cervical cancer Paternal uncle pancreatic cancer multiple myeloma and melanoma  Vitals:  Blood pressure (!) 148/68, pulse 89, temperature 97.7 F (36.5 C), temperature source Tympanic, resp. rate 18, height '5\' 5"'  (1.651 m), weight 103 lb 1.6 oz (46.8 kg), SpO2 100 %.  Physical Exam: WD in NAD Neck  Supple NROM, without any enlargements.  Lymph Node Survey No cervical supraclavicular or inguinal adenopathy Cardiovascular  Pulse normal rate, regularity and rhythm.  Lungs  Clear to auscultation bilaterally, breath sounds present at the bases .  Skin  No rash/lesions/breakdown  Psychiatry  Alert and oriented appropriate mood affect speech and reasoning. Abdomen  Normoactive bowel sounds, abdomen soft, non-tender, no masses or distension. Incisions soft. Back No CVA  tenderness Genito Urinary  Vaginal cuff smooth, no palpable masses. Rectal  deferred Extremities  No bilateral cyanosis, clubbing or edema.  Thereasa Solo, MD 08/20/2020, 5:13 PM

## 2020-08-22 DIAGNOSIS — L718 Other rosacea: Secondary | ICD-10-CM | POA: Diagnosis not present

## 2020-08-22 DIAGNOSIS — L814 Other melanin hyperpigmentation: Secondary | ICD-10-CM | POA: Diagnosis not present

## 2020-08-22 DIAGNOSIS — D2239 Melanocytic nevi of other parts of face: Secondary | ICD-10-CM | POA: Diagnosis not present

## 2020-08-22 DIAGNOSIS — D1801 Hemangioma of skin and subcutaneous tissue: Secondary | ICD-10-CM | POA: Diagnosis not present

## 2020-08-22 DIAGNOSIS — L821 Other seborrheic keratosis: Secondary | ICD-10-CM | POA: Diagnosis not present

## 2020-08-22 DIAGNOSIS — L820 Inflamed seborrheic keratosis: Secondary | ICD-10-CM | POA: Diagnosis not present

## 2020-08-22 DIAGNOSIS — D225 Melanocytic nevi of trunk: Secondary | ICD-10-CM | POA: Diagnosis not present

## 2020-08-22 DIAGNOSIS — D485 Neoplasm of uncertain behavior of skin: Secondary | ICD-10-CM | POA: Diagnosis not present

## 2020-08-23 ENCOUNTER — Telehealth: Payer: Self-pay

## 2020-08-23 NOTE — Telephone Encounter (Signed)
Called and left a message. Dr. Alvy Bimler reviewed labs from Dr. Sheryn Bison. Per Dr. Alvy Bimler, labs are normal. Ask her to call the office for questions.

## 2020-08-31 ENCOUNTER — Ambulatory Visit: Payer: Medicare Other | Admitting: Gynecologic Oncology

## 2020-09-14 ENCOUNTER — Encounter: Payer: Self-pay | Admitting: Hematology and Oncology

## 2020-10-02 ENCOUNTER — Other Ambulatory Visit: Payer: Self-pay

## 2020-10-02 ENCOUNTER — Inpatient Hospital Stay: Payer: Medicare Other | Attending: Gynecologic Oncology

## 2020-10-02 DIAGNOSIS — Z8544 Personal history of malignant neoplasm of other female genital organs: Secondary | ICD-10-CM | POA: Insufficient documentation

## 2020-10-02 DIAGNOSIS — Z452 Encounter for adjustment and management of vascular access device: Secondary | ICD-10-CM | POA: Insufficient documentation

## 2020-10-02 DIAGNOSIS — C482 Malignant neoplasm of peritoneum, unspecified: Secondary | ICD-10-CM

## 2020-10-02 MED ORDER — SODIUM CHLORIDE 0.9% FLUSH
10.0000 mL | Freq: Once | INTRAVENOUS | Status: AC
Start: 2020-10-02 — End: 2020-10-02
  Administered 2020-10-02: 10 mL
  Filled 2020-10-02: qty 10

## 2020-10-02 MED ORDER — HEPARIN SOD (PORK) LOCK FLUSH 100 UNIT/ML IV SOLN
500.0000 [IU] | Freq: Once | INTRAVENOUS | Status: AC
  Administered 2020-10-02: 500 [IU]
  Filled 2020-10-02: qty 5

## 2020-10-02 NOTE — Patient Instructions (Signed)

## 2020-10-03 DIAGNOSIS — H40033 Anatomical narrow angle, bilateral: Secondary | ICD-10-CM | POA: Diagnosis not present

## 2020-10-03 DIAGNOSIS — H43813 Vitreous degeneration, bilateral: Secondary | ICD-10-CM | POA: Diagnosis not present

## 2020-10-03 DIAGNOSIS — H401421 Capsular glaucoma with pseudoexfoliation of lens, left eye, mild stage: Secondary | ICD-10-CM | POA: Diagnosis not present

## 2020-10-03 DIAGNOSIS — H35372 Puckering of macula, left eye: Secondary | ICD-10-CM | POA: Diagnosis not present

## 2020-10-03 DIAGNOSIS — H2513 Age-related nuclear cataract, bilateral: Secondary | ICD-10-CM | POA: Diagnosis not present

## 2020-10-17 ENCOUNTER — Telehealth: Payer: Self-pay | Admitting: Hematology and Oncology

## 2020-10-17 NOTE — Telephone Encounter (Signed)
Pt called requesting to schedule her lab/flush in December for her appt with Dr. Denman George. Appt scheduled and pt aware.

## 2020-11-22 ENCOUNTER — Other Ambulatory Visit: Payer: Medicare Other

## 2020-11-26 ENCOUNTER — Ambulatory Visit: Payer: Medicare Other | Admitting: Hematology and Oncology

## 2020-11-29 ENCOUNTER — Other Ambulatory Visit: Payer: Self-pay

## 2020-11-29 ENCOUNTER — Inpatient Hospital Stay: Payer: Medicare Other | Attending: Gynecologic Oncology

## 2020-11-29 ENCOUNTER — Other Ambulatory Visit: Payer: Medicare Other

## 2020-11-29 DIAGNOSIS — Z79899 Other long term (current) drug therapy: Secondary | ICD-10-CM | POA: Diagnosis not present

## 2020-11-29 DIAGNOSIS — R5381 Other malaise: Secondary | ICD-10-CM | POA: Diagnosis not present

## 2020-11-29 DIAGNOSIS — R5383 Other fatigue: Secondary | ICD-10-CM | POA: Insufficient documentation

## 2020-11-29 DIAGNOSIS — T451X5A Adverse effect of antineoplastic and immunosuppressive drugs, initial encounter: Secondary | ICD-10-CM | POA: Insufficient documentation

## 2020-11-29 DIAGNOSIS — Z880 Allergy status to penicillin: Secondary | ICD-10-CM | POA: Insufficient documentation

## 2020-11-29 DIAGNOSIS — Z888 Allergy status to other drugs, medicaments and biological substances status: Secondary | ICD-10-CM | POA: Diagnosis not present

## 2020-11-29 DIAGNOSIS — Z882 Allergy status to sulfonamides status: Secondary | ICD-10-CM | POA: Insufficient documentation

## 2020-11-29 DIAGNOSIS — Z881 Allergy status to other antibiotic agents status: Secondary | ICD-10-CM | POA: Diagnosis not present

## 2020-11-29 DIAGNOSIS — R410 Disorientation, unspecified: Secondary | ICD-10-CM | POA: Insufficient documentation

## 2020-11-29 DIAGNOSIS — R42 Dizziness and giddiness: Secondary | ICD-10-CM | POA: Diagnosis not present

## 2020-11-29 DIAGNOSIS — D638 Anemia in other chronic diseases classified elsewhere: Secondary | ICD-10-CM | POA: Diagnosis not present

## 2020-11-29 DIAGNOSIS — Z8544 Personal history of malignant neoplasm of other female genital organs: Secondary | ICD-10-CM | POA: Diagnosis not present

## 2020-11-29 DIAGNOSIS — G620 Drug-induced polyneuropathy: Secondary | ICD-10-CM | POA: Insufficient documentation

## 2020-11-29 DIAGNOSIS — C482 Malignant neoplasm of peritoneum, unspecified: Secondary | ICD-10-CM

## 2020-11-29 DIAGNOSIS — Z885 Allergy status to narcotic agent status: Secondary | ICD-10-CM | POA: Diagnosis not present

## 2020-11-29 LAB — CBC WITH DIFFERENTIAL (CANCER CENTER ONLY)
Abs Immature Granulocytes: 0.01 10*3/uL (ref 0.00–0.07)
Basophils Absolute: 0 10*3/uL (ref 0.0–0.1)
Basophils Relative: 0 %
Eosinophils Absolute: 0.1 10*3/uL (ref 0.0–0.5)
Eosinophils Relative: 1 %
HCT: 34.9 % — ABNORMAL LOW (ref 36.0–46.0)
Hemoglobin: 11.8 g/dL — ABNORMAL LOW (ref 12.0–15.0)
Immature Granulocytes: 0 %
Lymphocytes Relative: 37 %
Lymphs Abs: 2 10*3/uL (ref 0.7–4.0)
MCH: 30.6 pg (ref 26.0–34.0)
MCHC: 33.8 g/dL (ref 30.0–36.0)
MCV: 90.6 fL (ref 80.0–100.0)
Monocytes Absolute: 0.4 10*3/uL (ref 0.1–1.0)
Monocytes Relative: 8 %
Neutro Abs: 2.9 10*3/uL (ref 1.7–7.7)
Neutrophils Relative %: 54 %
Platelet Count: 260 10*3/uL (ref 150–400)
RBC: 3.85 MIL/uL — ABNORMAL LOW (ref 3.87–5.11)
RDW: 12.9 % (ref 11.5–15.5)
WBC Count: 5.4 10*3/uL (ref 4.0–10.5)
nRBC: 0 % (ref 0.0–0.2)

## 2020-11-29 LAB — CMP (CANCER CENTER ONLY)
ALT: 13 U/L (ref 0–44)
AST: 22 U/L (ref 15–41)
Albumin: 4.3 g/dL (ref 3.5–5.0)
Alkaline Phosphatase: 67 U/L (ref 38–126)
Anion gap: 10 (ref 5–15)
BUN: 15 mg/dL (ref 8–23)
CO2: 27 mmol/L (ref 22–32)
Calcium: 9.5 mg/dL (ref 8.9–10.3)
Chloride: 102 mmol/L (ref 98–111)
Creatinine: 0.64 mg/dL (ref 0.44–1.00)
GFR, Estimated: >60 mL/min (ref >60)
Glucose, Bld: 81 mg/dL (ref 70–99)
Potassium: 3.8 mmol/L (ref 3.5–5.1)
Sodium: 139 mmol/L (ref 135–145)
Total Bilirubin: 0.5 mg/dL (ref 0.3–1.2)
Total Protein: 7.4 g/dL (ref 6.5–8.1)

## 2020-11-29 MED ORDER — SODIUM CHLORIDE 0.9% FLUSH
10.0000 mL | Freq: Once | INTRAVENOUS | Status: AC
  Administered 2020-11-29: 10 mL

## 2020-11-29 MED ORDER — HEPARIN SOD (PORK) LOCK FLUSH 100 UNIT/ML IV SOLN
500.0000 [IU] | Freq: Once | INTRAVENOUS | Status: AC
  Administered 2020-11-29: 500 [IU]

## 2020-11-30 ENCOUNTER — Telehealth: Payer: Self-pay | Admitting: Oncology

## 2020-11-30 LAB — CA 125: Cancer Antigen (CA) 125: 10.2 U/mL (ref 0.0–38.1)

## 2020-11-30 NOTE — Telephone Encounter (Signed)
[name] [name] and asked if she would be able to come in and see Dr. Alvy Bimler today at 12:20.  She said she has another appointment at that time that she can't cancel.  She asked if it is still ok to leave her appointment on Monday or if she needs to reschedule to another day.  She is concerned about having enough time to talk to Dr. Alvy Bimler  She also asked about her CA 125 and was advised of the results.

## 2020-11-30 NOTE — Telephone Encounter (Signed)
Called Sumaiya back and reschedule apt on Monday to 9:20 and also advised her to arrive early. Discussed that Dr. Alvy Bimler said all of her labs were good.  She was able to see them in MyChart.

## 2020-12-03 ENCOUNTER — Inpatient Hospital Stay (HOSPITAL_BASED_OUTPATIENT_CLINIC_OR_DEPARTMENT_OTHER): Payer: Medicare Other | Admitting: Hematology and Oncology

## 2020-12-03 ENCOUNTER — Inpatient Hospital Stay: Payer: Medicare Other | Admitting: Hematology and Oncology

## 2020-12-03 ENCOUNTER — Other Ambulatory Visit: Payer: Self-pay

## 2020-12-03 ENCOUNTER — Encounter: Payer: Self-pay | Admitting: Hematology and Oncology

## 2020-12-03 VITALS — BP 142/56 | HR 86 | Temp 98.3°F | Resp 18 | Ht 65.0 in | Wt 101.8 lb

## 2020-12-03 DIAGNOSIS — T451X5A Adverse effect of antineoplastic and immunosuppressive drugs, initial encounter: Secondary | ICD-10-CM | POA: Diagnosis not present

## 2020-12-03 DIAGNOSIS — G620 Drug-induced polyneuropathy: Secondary | ICD-10-CM | POA: Diagnosis not present

## 2020-12-03 DIAGNOSIS — R278 Other lack of coordination: Secondary | ICD-10-CM | POA: Diagnosis not present

## 2020-12-03 DIAGNOSIS — C482 Malignant neoplasm of peritoneum, unspecified: Secondary | ICD-10-CM

## 2020-12-03 DIAGNOSIS — Z86000 Personal history of in-situ neoplasm of breast: Secondary | ICD-10-CM

## 2020-12-03 DIAGNOSIS — Z8544 Personal history of malignant neoplasm of other female genital organs: Secondary | ICD-10-CM | POA: Diagnosis not present

## 2020-12-03 DIAGNOSIS — R410 Disorientation, unspecified: Secondary | ICD-10-CM | POA: Diagnosis not present

## 2020-12-03 DIAGNOSIS — R5381 Other malaise: Secondary | ICD-10-CM

## 2020-12-03 DIAGNOSIS — R42 Dizziness and giddiness: Secondary | ICD-10-CM | POA: Diagnosis not present

## 2020-12-03 DIAGNOSIS — D638 Anemia in other chronic diseases classified elsewhere: Secondary | ICD-10-CM

## 2020-12-03 NOTE — Assessment & Plan Note (Signed)
Clinically, she is doing well with no signs of disease Her tumor marker is within normal limits Her clinical exam is benign I reassured the patient today For now, I will schedule port flush every 8 weeks and blood work along with tumor marker She will see GYN surgeon in 3 months and I will see her back in 6 months for further follow-up

## 2020-12-03 NOTE — Assessment & Plan Note (Signed)
I suspect her medication gabapentin might have caused some of the excessive sleepiness and fatigue I think it is beneficial for her to taper but I would prefer not to do anything right now until she is seen neurologist for evaluation

## 2020-12-03 NOTE — Assessment & Plan Note (Signed)
She is quite debilitated with multiple neurological conditions Her balance is worse but she denies fall She has several episodes of dizziness, some of them were related to changes in posture but not all of them and also increase mental brain fog with confusion What she meant for confusion was dyspraxia such as parking her car I recommend neurology consultation I do not believe this is related to her prior treatment which has since been discontinued for a year and a half

## 2020-12-03 NOTE — Assessment & Plan Note (Signed)
Her breast exam is normal She will continue screening mammogram

## 2020-12-03 NOTE — Assessment & Plan Note (Signed)
Despite discontinuation of chemotherapy, she remained anemic I suspect this is likely anemia of chronic illness related to her low body weight I do not believe her excessive fatigue is related to that She does not need further work-up for this

## 2020-12-03 NOTE — Progress Notes (Signed)
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[facility]) Clinically, she is doing well with no signs of disease Her tumor marker is within normal limits Her clinical exam is benign I reassured the patient today For now, I will schedule port flush every 8 weeks and blood work along with tumor marker She will see GYN surgeon in 3 months and I will see her back in 6 months for further follow-up   Anemia, chronic disease Despite discontinuation of chemotherapy, she remained anemic I suspect this is likely anemia of chronic illness related to her low body weight I do not believe her excessive fatigue is related to that She does not need further work-up for this  History of ductal carcinoma in situ (DCIS) of breast Her breast exam is normal She will continue screening mammogram  Physical debility She is quite debilitated with multiple neurological conditions Her balance is worse but she denies fall She has several episodes of dizziness, some of them were related to changes in posture but not all of them and also increase mental brain fog with confusion What she meant for confusion was dyspraxia such as parking her car I recommend neurology consultation I do not believe this is related to her prior treatment which has since been discontinued for a year and a half  Peripheral neuropathy due to chemotherapy ([place]) I suspect her medication gabapentin might have caused some of the excessive sleepiness and fatigue I think it is beneficial for her to taper but I would prefer not to do anything right now until she is seen neurologist for evaluation  Orders Placed This Encounter  Procedures   Ambulatory referral to Neurology    Referral Priority:   Routine    Referral  Type:   Consultation    Referral Reason:   Specialty Services Required    Requested Specialty:   Neurology    Number of Visits Requested:   1    All questions were answered. The patient knows to call the clinic with any problems, questions or concerns. The total time spent in the appointment was 30 minutes encounter with patients including review of chart and various tests results, discussions about plan of care and coordination of care plan   [name], MD 12/03/2020 9:50 AM  INTERVAL HISTORY: Please see below for problem oriented charting. 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  She is due to see an optometrist soon She complained of extreme sleepiness and fatigue with intermittent episodes of very low energy level She also disclosed symptoms of unusual dizziness that sometimes is postural related.  She have chronic brain fog but seems to be a little worse recently when she have difficulties executing certain tasks such as parking her car.  She also noted that her balance is worse  REVIEW OF SYSTEMS:   Constitutional: Denies fevers, chills or abnormal weight loss Ears, nose, mouth, throat,  and face: Denies mucositis or sore throat Respiratory: Denies cough, dyspnea or wheezes Cardiovascular: Denies palpitation, chest discomfort or lower extremity swelling Gastrointestinal:  Denies nausea, heartburn or change in bowel habits Skin: Denies abnormal skin rashes Lymphatics: Denies new lymphadenopathy or easy bruising Behavioral/Psych: Mood is stable, no new changes  All other systems  were reviewed with the patient and are negative.  I have reviewed the past medical history, past surgical history, social history and family history with the patient and they are unchanged from previous note.  ALLERGIES:  is allergic to fentanyl, lac bovis, brimonidine tartrate, milk-related compounds, other, oxybutynin chloride, thimerosal, tramadol, avelox [moxifloxacin hcl in nacl], doxycycline, erythromycin, metronidazole, minocycline, penicillins, quinolones, and sulfonamide derivatives.  MEDICATIONS:  Current Outpatient Medications  Medication Sig Dispense Refill   risedronate (ACTONEL) 35 MG tablet Take 35 mg by mouth every 7 (seven) days. with water on empty stomach, nothing by mouth or lie down for next 30 minutes.     ARTIFICIAL TEAR SOLUTION OP Place 1 drop into both eyes daily as needed (dry eyes).     Azelaic Acid 15 % cream Apply 1 application topically daily.     cholecalciferol (VITAMIN D3) 25 MCG (1000 UNIT) tablet Take 2,000 Units by mouth daily.     cromolyn (NASALCROM) 5.2 MG/ACT nasal spray Place 1 spray into the nose as needed for allergies.      gabapentin (NEURONTIN) 300 MG capsule Take 300 mg by mouth. Taking 600 mg in am ;300 mg at lunch; 300 mg at hs.     Ketotifen Fumarate (ALLERGY EYE DROPS OP) Place 1 drop into both eyes daily as needed (allergies).     lidocaine-prilocaine (EMLA) cream APPLY TOPICALLY AS NEEDED (PORT ACCESS). 30 g 3   loratadine (CLARITIN) 5 MG/5ML syrup Take 5 mg by mouth daily as needed for allergies (bone pain related to chemo).     Probiotic Product (PROBIOTIC DAILY PO) Take by mouth.     Sulfacetamide Sodium, Acne, 10 % LOTN Apply 1 application topically at bedtime.     VITAMIN E PO Take 50 Int'l Units/day by mouth daily.     No current facility-administered medications for this visit.    SUMMARY OF ONCOLOGIC HISTORY: Oncology History Overview Note  Negative genetics High grade serous   Peritoneal carcinoma ([place])  11/23/2018 Imaging    Ct abdomen and pelvis 1. Findings highly suspicious for peritoneal carcinomatosis. Recommend paracentesis for therapeutic and diagnostic purposes. I do not see an obvious primary lesion but there is some irregular enhancing soft tissue in the right adnexal area. CA 125 level may be helpful. 2. Mild surface irregularity involving the liver but I do not see any obvious changes of cirrhosis. No hepatic lesions.   11/23/2018 Tumor Marker   Patient's tumor was tested for the following markers: CA-125 Results of the tumor marker test revealed 253.   11/25/2018 Initial Diagnosis   Peritoneal carcinoma ([place])   11/25/2018 Imaging   US paracentesis Successful ultrasound-guided paracentesis yielding 3.4 L of peritoneal fluid.   12/01/2018 Cancer Staging   Staging form: Ovary, Fallopian Tube, and Primary Peritoneal Carcinoma, AJCC 8th Edition - Clinical stage from 12/01/2018: cT3, cN0, cM0 - Signed by [name], MD on 12/01/2018   12/02/2018 Tumor Marker   Patient's tumor was tested for the following markers: CA-125 Results of the tumor marker test revealed 267   12/06/2018 - 05/16/2019 Chemotherapy   The patient had carboplatin and taxol for chemotherapy treatment.     12/08/2018 Procedure  Successful ultrasound-guided therapeutic paracentesis yielding 1.7 liters of peritoneal fluid.   12/24/2018 Procedure   Successful placement of a right IJ approach Power Port with ultrasound and fluoroscopic guidance. The catheter is ready for use   01/17/2019 Tumor Marker   Patient's tumor was tested for the following markers: CA-125 Results of the tumor marker test revealed 57.2   01/31/2019 Imaging   Significant decrease in peritoneal carcinomatosis since previous study. Interval resolution of ascites.   Focal area of parenchymal consolidation in central right middle lobe, which measures 3 cm. Differential diagnosis includes infectious or inflammatory process, atelectasis, and neoplasm. Recommend short-term  follow-up by chest CT in 2-3 months.     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   Surgeon: Donaciano Eva    Operative Findings:  : grossly normal uterus, ovaries normal, few scattered peritoneal nodules (74m) on serosa of uterus and tubes. The omentum (gastrocolic) was tethered to the mesentery with tumor (thin rind). Complete (optimal) resection of tumor with no gross residual disease.     02/17/2019 Pathology Results   FINAL MICROSCOPIC DIAGNOSIS: A. UTERUS, CERVIX, BILATERAL FALLOPIAN TUBES AND OVARIES, HYSTERECTOMY WITH SALPINGOOOPHORECTOMY: - Uterus: Endometrium: Inactive endometrium. No hyperplasia or malignancy. Myometrium: Unremarkable. No malignancy. Serosa: Metastatic carcinoma. No malignancy. - Cervix: Benign squamous and endocervical mucosa. No dysplasia or malignancy. - Left ovary and fallopian tube: Metastatic carcinoma. - Right ovary: No malignancy identified. - Right fallopian tube: Luminal tumor, see comment. B. OMENTUM, RESECTION: - High grade serous carcinoma. - Deposits up to at least 3 cm. - See oncology table. ONCOLOGY TABLE: OVARY or FALLOPIAN TUBE or PRIMARY PERITONEUM: Procedure: Hysterectomy with bilateral salpingo-oophorectomy and omentectomy. Specimen Integrity: Intact Tumor Site: Peritoneum Ovarian Surface Involvement (required only if applicable): Left ovary Fallopian Tube Surface Involvement (required only if applicable): Left Fallopian tube Tumor Size: Largest deposit 3 cm Histologic Type: High-grade serous carcinoma Histologic Grade: High-grade Implants (required for advanced stage serous/seromucinous borderline tumors only): Uterine serosa, left fallopian tube and ovary, omentum Other Tissue/  Organ Involvement: As above Largest Extrapelvic Peritoneal Focus (required only if applicable): 3 cm Peritoneal/Ascitic Fluid: Pre neoadjuvant [id number] positive for carcinoma. Treatment Effect (required only for high-grade serous carcinomas): Probably partial treatment effect in omental tissue. Regional Lymph Nodes: No lymph nodes submitted or found Pathologic Stage Classification (pTNM, AJCC 8th Edition): ypT3c, ypNX Representative Tumor Block: B5 Comment(s): There is surface involvement of the left ovary and fallopian tube but no primary tumor. Within the right fallopian tube lumen there is detached fragments of tumor but again no precursor lesion is noted in the right fallopian tube. Thus, the tumor is presumed primary peritoneal and staged as such.   03/21/2019 Tumor Marker   Patient's tumor was tested for the following markers: CA-125 Results of the tumor marker test revealed 15.1    Genetic Testing   Negative genetic testing. No pathogenic variants identified on the [facility] CancerNext + RNAinsight panel. VUS in ATM called c.[id number] identified. The report date is 05/12/2019. TumorNext HRD was originally ordered but there was not enough sample to complete this testing.   The CancerNext+RNAinsight gene panel offered by [employer] [facility] sequencing and rearrangement analysis for the following 36 genes: APC*, ATM*, AXIN2, BARD1, BMPR1A, BRCA1*, BRCA2*, BRIP1*, CDH1*, CDK4, CDKN2A, CHEK2*, DICER1, MLH1*, MSH2*, MSH3, MSH6*, MUTYH*, NBN, NF1*, NTHL1, PALB2*, PMS2*,  PTEN*, RAD51C*, RAD51D*, RECQL, SMAD4, SMARCA4, STK11 and TP53* (sequencing and deletion/duplication); HOXB13, POLD1 and POLE (sequencing only); EPCAM and GREM1 (deletion/duplication only). DNA and RNA analyses performed for * genes.    05/16/2019 Tumor Marker   Patient's tumor was tested for the following markers: CA-125 Results of the tumor marker test revealed 10.7   06/16/2019 Imaging   1. No evidence of residual or recurrent  metastatic disease in the chest, abdomen or pelvis. 2. Masslike focus of consolidation in the right middle lobe along the major and minor fissures with associated volume loss is stable since 01/31/2019. Indolent primary pulmonary neoplasm not excluded. PET-CT may be considered for further characterization. 3. Aortic Atherosclerosis (ICD10-I70.0).   06/16/2019 Tumor Marker   Patient's tumor was tested for the following markers: CA-125 Results of the tumor marker test revealed 9.1.   12/05/2019 Tumor Marker   Patient's tumor was tested for the following markers: CA-125. Results of the tumor marker test revealed 9.0.   02/29/2020 Tumor Marker   Patient's tumor was tested for the following markers: CA-`125 Results of the tumor marker test revealed 9.7.   05/28/2020 Tumor Marker   Patient's tumor was tested for the following markers: CA-125 Results of the tumor marker test revealed 9.5   08/16/2020 Tumor Marker   Patient's tumor was tested for the following markers: CA-125 Results of the tumor marker test revealed 10.9   11/29/2020 Tumor Marker   Patient's tumor was tested for the following markers: CA-125. Results of the tumor marker test revealed 10.2.     PHYSICAL EXAMINATION: ECOG PERFORMANCE STATUS: 1 - Symptomatic but completely ambulatory  Vitals:   12/03/20 0917  BP: (!) 142/56  Pulse: 86  Resp: 18  Temp: 98.3 F (36.8 C)  SpO2: 99%   Filed Weights   12/03/20 0917  Weight: 101 lb 12.8 oz (46.2 kg)    GENERAL:alert, no distress and comfortable SKIN: skin color, texture, turgor are normal, no rashes or significant lesions EYES: normal, Conjunctiva are pink and non-injected, sclera clear OROPHARYNX:no exudate, no erythema and lips, buccal mucosa, and tongue normal  NECK: supple, thyroid normal size, non-tender, without nodularity LYMPH:  no palpable lymphadenopathy in the cervical, axillary or inguinal LUNGS: clear to auscultation and percussion with normal breathing  effort HEART: regular rate & rhythm and no murmurs and no lower extremity edema ABDOMEN:abdomen soft, non-tender and normal bowel sounds.  Noted well-healed surgical scar Musculoskeletal:no cyanosis of digits and no clubbing  NEURO: alert & oriented x 3 with fluent speech, no focal motor/sensory deficits Breast examination reveals well-healed surgical scar with no other abnormalities  LABORATORY DATA:  I have reviewed the data as listed    Component Value Date/Time   NA 139 11/29/2020 1007   K 3.8 11/29/2020 1007   CL 102 11/29/2020 1007   CO2 27 11/29/2020 1007   GLUCOSE 81 11/29/2020 1007   BUN 15 11/29/2020 1007   CREATININE 0.64 11/29/2020 1007   CALCIUM 9.5 11/29/2020 1007   PROT 7.4 11/29/2020 1007   ALBUMIN 4.3 11/29/2020 1007   AST 22 11/29/2020 1007   ALT 13 11/29/2020 1007   ALKPHOS 67 11/29/2020 1007   BILITOT 0.5 11/29/2020 1007   GFRNONAA >60 11/29/2020 1007   GFRAA >60 12/05/2019 1145    No results found for: SPEP, UPEP  Lab Results  Component Value Date   WBC 5.4 11/29/2020   NEUTROABS 2.9 11/29/2020   HGB 11.8 (L) 11/29/2020   HCT 34.9 (L) 11/29/2020   MCV  90.6 11/29/2020   PLT 260 11/29/2020      Chemistry      Component Value Date/Time   NA 139 11/29/2020 1007   K 3.8 11/29/2020 1007   CL 102 11/29/2020 1007   CO2 27 11/29/2020 1007   BUN 15 11/29/2020 1007   CREATININE 0.64 11/29/2020 1007      Component Value Date/Time   CALCIUM 9.5 11/29/2020 1007   ALKPHOS 67 11/29/2020 1007   AST 22 11/29/2020 1007   ALT 13 11/29/2020 1007   BILITOT 0.5 11/29/2020 1007

## 2020-12-04 ENCOUNTER — Telehealth: Payer: Self-pay

## 2020-12-04 NOTE — Telephone Encounter (Signed)
RN returned call regarding questions about appointments. Pt stated she will call back later today since she is at another appointment.

## 2020-12-11 ENCOUNTER — Encounter: Payer: Self-pay | Admitting: Hematology and Oncology

## 2020-12-11 DIAGNOSIS — Z1231 Encounter for screening mammogram for malignant neoplasm of breast: Secondary | ICD-10-CM | POA: Diagnosis not present

## 2020-12-17 DIAGNOSIS — Z23 Encounter for immunization: Secondary | ICD-10-CM | POA: Diagnosis not present

## 2020-12-20 ENCOUNTER — Ambulatory Visit (INDEPENDENT_AMBULATORY_CARE_PROVIDER_SITE_OTHER): Payer: Medicare Other | Admitting: Cardiology

## 2020-12-20 ENCOUNTER — Other Ambulatory Visit: Payer: Self-pay

## 2020-12-20 ENCOUNTER — Encounter: Payer: Self-pay | Admitting: Cardiology

## 2020-12-20 VITALS — BP 128/62 | HR 80 | Ht 65.0 in | Wt 102.8 lb

## 2020-12-20 DIAGNOSIS — I493 Ventricular premature depolarization: Secondary | ICD-10-CM | POA: Diagnosis not present

## 2020-12-20 DIAGNOSIS — I059 Rheumatic mitral valve disease, unspecified: Secondary | ICD-10-CM

## 2020-12-20 DIAGNOSIS — I447 Left bundle-branch block, unspecified: Secondary | ICD-10-CM

## 2020-12-20 DIAGNOSIS — I471 Supraventricular tachycardia: Secondary | ICD-10-CM

## 2020-12-20 NOTE — Progress Notes (Signed)
Cardiology Office Note:    Date:  12/20/2020   ID:  [name] [name], DOB 05-14-1950, MRN [id number]  PCP:  [name] Melter, MD  Cardiologist:  Fransico Him, MD   Electrophysiologist:  None   Referring MD: [name] Melter, MD   Chief Complaint  Patient presents with   Follow-up    PVCs, PAT, LBBB     History of Present Illness:    [name] [name] is a 70 y.o. female with: Paroxysmal atrial tachycardia PACs/PVCs Mitral valve prolapse LBBB GERD  [name] [name] is a 70yo female with a hx of PAT, PAC/sPVCs, LBBB and MVP.  Remote echocardiogram for SOB demonstrated an EF of 45-50%.  Myoview demonstrated no ischemia and  EF was normal in 2016.    She is here today for followup and is doing well.  She denies any chest pain or pressure, SOB, DOE, PND, orthopnea, LE edema or syncope. She has had some problems with dizziness where she feels off balance since having chemo.  She occasionally has palpitations but are very sporadic and well tolerated. She is compliant with her meds and is tolerating meds with no SE.     Prior CV studies:   The following studies were reviewed today:  Myoview 07/25/2014 Myocardial perfusion is abnormal. There is a moderate-sized fixed septal defect, secondary to LBBB. No reversible ischemia This is a low risk study. Overall left ventricular systolic function was normal. The left ventricular ejection fraction is normal (55-65%). There is no prior study for comparison.  Echocardiogram 07/25/2014 EF 45-50  Past Medical History:  Diagnosis Date   Allergic rhinitis    Cervical dysplasia 1980   Cervicalgia    Eczema    Rosacea,dermatitis-Dr Stinehelfer   Esophageal reflux    Family history of colon cancer    Family history of melanoma    Family history of ovarian cancer    Family history of pancreatic cancer    Family history of prostate cancer    Family history of uterine cancer    Fibromyalgia    GERD (gastroesophageal reflux disease)    Glaucoma,  narrow-angle    Heart murmur    Hiatal hernia    Irritable bowel syndrome    LBBB (left bundle branch block)    Left bundle branch block    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), unspecified site    DCIS   Mass of lung    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 Arlyce Dice   Mitral valve disorders(424.0)    No antibiotics required   Osteoporosis 12/2016   T score -2.3, 2014 T score -2.5 AP spine   PAC (premature atrial contraction) 2012   ,PVC's, and nonsustained atril tachycardia w aberration by heart monitor    PVC (premature ventricular contraction)    Rosacea    Stricture and stenosis of esophagus    Surgical Hx: The patient  has a past surgical history that includes Neck surgery (1443-1540); Rhinoplasty (1976); Tubal ligation (1977); Breast lumpectomy (1998); Refractive surgery; Cervical discectomy; Gynecologic cryosurgery (1980); and IR IMAGING GUIDED PORT INSERTION (12/24/2018).   Current Medications: Current Meds  Medication Sig   ARTIFICIAL TEAR SOLUTION OP Place 1 drop into both eyes daily as needed (dry eyes).   Azelaic Acid 15 % cream Apply 1 application topically daily.   cholecalciferol (VITAMIN D3) 25 MCG (1000 UNIT) tablet Take 2,000 Units by mouth daily.   cromolyn (NASALCROM) 5.2 MG/ACT nasal spray Place 1 spray into the nose as needed for allergies.  gabapentin (NEURONTIN) 300 MG capsule Take 300 mg by mouth. Taking 600 mg in am ;300 mg at lunch; 300 mg at hs.   Ketotifen Fumarate (ALLERGY EYE DROPS OP) Place 1 drop into both eyes daily as needed (allergies).   lidocaine-prilocaine (EMLA) cream APPLY TOPICALLY AS NEEDED (PORT ACCESS).   loratadine (CLARITIN) 5 MG/5ML syrup Take 5 mg by mouth daily as needed for allergies (bone pain related to chemo).   Magnesium 250 MG TABS Take 250 mg by mouth daily.   Probiotic Product (PROBIOTIC DAILY PO) Take by mouth.   risedronate (ACTONEL) 35 MG tablet Take 35 mg by mouth every 7 (seven) days. with water on empty stomach,  nothing by mouth or lie down for next 30 minutes.   Sulfacetamide Sodium, Acne, 10 % LOTN Apply 1 application topically at bedtime.   VITAMIN E PO Take 50 Int'l Units/day by mouth daily.     Allergies:   Fentanyl, Lac bovis, Brimonidine tartrate, Milk-related compounds, Other, Oxybutynin chloride, Thimerosal, Tramadol, Avelox [moxifloxacin hcl in nacl], Doxycycline, Erythromycin, Metronidazole, Minocycline, Penicillins, Quinolones, and Sulfonamide derivatives   Social History   Tobacco Use   Smoking status: Never   Smokeless tobacco: Never  Vaping Use   Vaping Use: Never used  Substance Use Topics   Alcohol use: Yes    Alcohol/week: 2.0 standard drinks    Types: 2 Standard drinks or equivalent per week    Comment: Social    Drug use: No     Family Hx: The patient's family history includes Aneurysm in her father; Cancer in her paternal uncle; Cancer (age of onset: 103) in her maternal grandfather; Depression in her mother and sister; Heart disease in her father, mother, and paternal grandfather; Hypertension in her father, mother, and sister; Melanoma (age of onset: 33) in her sister; Melanoma (age of onset: 66) in her sister; Ovarian cancer (age of onset: 3) in her sister; Stroke in her maternal grandmother and paternal grandmother; Uterine cancer (age of onset: 60) in her sister. There is no history of Colon cancer.  ROS:   Please see the history of present illness.    ROS All other systems reviewed and are negative.   EKGs/Labs/Other Test Reviewed:    EKG:  EKG is  ordered today.  The ekg ordered today demonstrates NSR with iLBBB with repol abnormality no change from 2020  Recent Labs: 11/29/2020: ALT 13; BUN 15; Creatinine 0.64; Hemoglobin 11.8; Platelet Count 260; Potassium 3.8; Sodium 139   Recent Lipid Panel No results found for: CHOL, TRIG, HDL, CHOLHDL, LDLCALC, LDLDIRECT  Physical Exam:    VS:  BP 128/62   Pulse 80   Ht 5\' 5"  (1.651 m)   Wt 102 lb 12.8 oz (46.6  kg)   SpO2 98%   BMI 17.11 kg/m     Wt Readings from Last 3 Encounters:  12/20/20 102 lb 12.8 oz (46.6 kg)  12/03/20 101 lb 12.8 oz (46.2 kg)  08/20/20 103 lb 1.6 oz (46.8 kg)    GEN: Well nourished, well developed in no acute distress HEENT: Normal NECK: No JVD; No carotid bruits LYMPHATICS: No lymphadenopathy CARDIAC:RRR, no murmurs, rubs, gallops RESPIRATORY:  Clear to auscultation without rales, wheezing or rhonchi  ABDOMEN: Soft, non-tender, non-distended MUSCULOSKELETAL:  No edema; No deformity  SKIN: Warm and dry NEUROLOGIC:  Alert and oriented x 3 PSYCHIATRIC:  Normal affect    ASSESSMENT & PLAN:     Mitral valve disorder -2D echo 2016 with no MVP -She does not  require antibiotic prophylaxis.  PVC (premature ventricular contraction)/PAT Her palpitations are very sporadic and well tolerated  LBBB (left bundle branch block) -this is chronic -no ischemia on Leixscan myoview in 2016 -she is asymptomatic -EF on echo 2016 45-50% but in setting of abnormal septal motion and normal EF on nuclear stress test -I am going to repeat 2D echo given that she has been on chemo to reassess LVF   Dispo:  Followup with me in 1 year  Medication Adjustments/Labs and Tests Ordered: Current medicines are reviewed at length with the patient today.  Concerns regarding medicines are outlined above.  Tests Ordered: Orders Placed This Encounter  Procedures   EKG 12-Lead   Medication Changes: No orders of the defined types were placed in this encounter.   Signed, Fransico Him, MD  12/20/2020 10:27 AM    [address], [place], [place]  [address] Phone: [phone]; Fax: [phone]

## 2020-12-20 NOTE — Addendum Note (Signed)
Addended by: Jacinta Shoe on: 12/20/2020 10:34 AM   Modules accepted: Orders

## 2020-12-20 NOTE — Patient Instructions (Signed)
Medication Instructions:  Your physician recommends that you continue on your current medications as directed. Please refer to the Current Medication list given to you today.  *If you need a refill on your cardiac medications before your next appointment, please call your pharmacy*   Lab Work: NONE If you have labs (blood work) drawn today and your tests are completely normal, you will receive your results only by: [place] (if you have MyChart) OR A paper copy in the mail If you have any lab test that is abnormal or we need to change your treatment, we will call you to review the results.   Testing/Procedures: Your physician has requested that you have an echocardiogram. Echocardiography is a painless test that uses sound waves to create images of your heart. It provides your doctor with information about the size and shape of your heart and how well your heart's chambers and valves are working. This procedure takes approximately one hour. There are no restrictions for this procedure.   Follow-Up: At [facility], you and your health needs are our priority.  As part of our continuing mission to provide you with exceptional heart care, we have created designated Provider Care Teams.  These Care Teams include your primary Cardiologist (physician) and Advanced Practice Providers (APPs -  Physician Assistants and Nurse Practitioners) who all work together to provide you with the care you need, when you need it.  We recommend signing up for the patient portal called "MyChart".  Sign up information is provided on this After Visit Summary.  MyChart is used to connect with patients for Virtual Visits (Telemedicine).  Patients are able to view lab/test results, encounter notes, upcoming appointments, etc.  Non-urgent messages can be sent to your provider as well.   To learn more about what you can do with MyChart, go to [email or web address].    Your next appointment:   1 year(s)  The  format for your next appointment:   In Person  Provider:   You may see Fransico Him, MD or one of the following Advanced Practice Providers on your designated Care Team:   Melina Copa, PA-C Ermalinda Barrios, PA-C   Other Instructions Echocardiogram An echocardiogram is a test that uses sound waves (ultrasound) to produce images of the heart. Images from an echocardiogram can provide important information about: Heart size and shape. The size and thickness and movement of your heart's walls. Heart muscle function and strength. Heart valve function or if you have stenosis. Stenosis is when the heart valves are too narrow. If blood is flowing backward through the heart valves (regurgitation). A tumor or infectious growth around the heart valves. Areas of heart muscle that are not working well because of poor blood flow or injury from a heart attack. Aneurysm detection. An aneurysm is a weak or damaged part of an artery wall. The wall bulges out from the normal force of blood pumping through the body. Tell a health care provider about: Any allergies you have. All medicines you are taking, including vitamins, herbs, eye drops, creams, and over-the-counter medicines. Any blood disorders you have. Any surgeries you have had. Any medical conditions you have. Whether you are pregnant or may be pregnant. What are the risks? Generally, this is a safe test. However, problems may occur, including an allergic reaction to dye (contrast) that may be used during the test. What happens before the test? No specific preparation is needed. You may eat and drink normally. What happens during the test?  You  will take off your clothes from the waist up and put on a hospital gown. Electrodes or electrocardiogram (ECG)patches may be placed on your chest. The electrodes or patches are then connected to a device that monitors your heart rate and rhythm. You will lie down on a table for an ultrasound exam. A gel  will be applied to your chest to help sound waves pass through your skin. A handheld device, called a transducer, will be pressed against your chest and moved over your heart. The transducer produces sound waves that travel to your heart and bounce back (or "echo" back) to the transducer. These sound waves will be captured in real-time and changed into images of your heart that can be viewed on a video monitor. The images will be recorded on a computer and reviewed by your health care provider. You may be asked to change positions or hold your breath for a short time. This makes it easier to get different views or better views of your heart. In some cases, you may receive contrast through an IV in one of your veins. This can improve the quality of the pictures from your heart. The procedure may vary among health care providers and hospitals. What can I expect after the test? You may return to your normal, everyday life, including diet, activities, and medicines, unless your health care provider tells you not to do that. Follow these instructions at home: It is up to you to get the results of your test. Ask your health care provider, or the department that is doing the test, when your results will be ready. Keep all follow-up visits. This is important. Summary An echocardiogram is a test that uses sound waves (ultrasound) to produce images of the heart. Images from an echocardiogram can provide important information about the size and shape of your heart, heart muscle function, heart valve function, and other possible heart problems. You do not need to do anything to prepare before this test. You may eat and drink normally. After the echocardiogram is completed, you may return to your normal, everyday life, unless your health care provider tells you not to do that. This information is not intended to replace advice given to you by your health care provider. Make sure you discuss any questions you have  with your health care provider. Document Revised: 10/25/2019 Document Reviewed: 10/25/2019 Elsevier Patient Education  2022 [employer].

## 2020-12-22 ENCOUNTER — Other Ambulatory Visit: Payer: Self-pay

## 2020-12-22 ENCOUNTER — Observation Stay (HOSPITAL_COMMUNITY)
Admission: EM | Admit: 2020-12-22 | Discharge: 2020-12-24 | Disposition: A | Discharge status: Home / Self Care | Payer: Medicare Other | Attending: Internal Medicine | Admitting: Internal Medicine

## 2020-12-22 ENCOUNTER — Observation Stay (HOSPITAL_COMMUNITY): Payer: Medicare Other

## 2020-12-22 ENCOUNTER — Encounter (HOSPITAL_COMMUNITY): Payer: Self-pay

## 2020-12-22 ENCOUNTER — Emergency Department (HOSPITAL_COMMUNITY): Payer: Medicare Other

## 2020-12-22 DIAGNOSIS — R42 Dizziness and giddiness: Principal | ICD-10-CM | POA: Insufficient documentation

## 2020-12-22 DIAGNOSIS — G9389 Other specified disorders of brain: Secondary | ICD-10-CM

## 2020-12-22 DIAGNOSIS — Z86000 Personal history of in-situ neoplasm of breast: Secondary | ICD-10-CM | POA: Diagnosis present

## 2020-12-22 DIAGNOSIS — J181 Lobar pneumonia, unspecified organism: Secondary | ICD-10-CM | POA: Diagnosis not present

## 2020-12-22 DIAGNOSIS — R569 Unspecified convulsions: Secondary | ICD-10-CM

## 2020-12-22 DIAGNOSIS — Z79899 Other long term (current) drug therapy: Secondary | ICD-10-CM | POA: Diagnosis not present

## 2020-12-22 DIAGNOSIS — Z853 Personal history of malignant neoplasm of breast: Secondary | ICD-10-CM | POA: Insufficient documentation

## 2020-12-22 DIAGNOSIS — G319 Degenerative disease of nervous system, unspecified: Secondary | ICD-10-CM | POA: Diagnosis not present

## 2020-12-22 DIAGNOSIS — I700 Atherosclerosis of aorta: Secondary | ICD-10-CM | POA: Diagnosis not present

## 2020-12-22 DIAGNOSIS — C719 Malignant neoplasm of brain, unspecified: Secondary | ICD-10-CM | POA: Diagnosis not present

## 2020-12-22 DIAGNOSIS — R590 Localized enlarged lymph nodes: Secondary | ICD-10-CM | POA: Diagnosis not present

## 2020-12-22 DIAGNOSIS — Z20822 Contact with and (suspected) exposure to covid-19: Secondary | ICD-10-CM | POA: Diagnosis not present

## 2020-12-22 DIAGNOSIS — C801 Malignant (primary) neoplasm, unspecified: Secondary | ICD-10-CM

## 2020-12-22 DIAGNOSIS — C482 Malignant neoplasm of peritoneum, unspecified: Secondary | ICD-10-CM | POA: Diagnosis present

## 2020-12-22 DIAGNOSIS — C7931 Secondary malignant neoplasm of brain: Secondary | ICD-10-CM | POA: Insufficient documentation

## 2020-12-22 LAB — COMPREHENSIVE METABOLIC PANEL
ALT: 15 U/L (ref 0–44)
AST: 25 U/L (ref 15–41)
Albumin: 4.5 g/dL (ref 3.5–5.0)
Alkaline Phosphatase: 63 U/L (ref 38–126)
Anion gap: 9 (ref 5–15)
BUN: 13 mg/dL (ref 8–23)
CO2: 29 mmol/L (ref 22–32)
Calcium: 9.7 mg/dL (ref 8.9–10.3)
Chloride: 99 mmol/L (ref 98–111)
Creatinine, Ser: 0.55 mg/dL (ref 0.44–1.00)
GFR, Estimated: >60 mL/min (ref >60)
Glucose, Bld: 92 mg/dL (ref 70–99)
Potassium: 3.9 mmol/L (ref 3.5–5.1)
Sodium: 137 mmol/L (ref 135–145)
Total Bilirubin: 0.8 mg/dL (ref 0.3–1.2)
Total Protein: 7.9 g/dL (ref 6.5–8.1)

## 2020-12-22 LAB — CBC WITH DIFFERENTIAL/PLATELET
Abs Immature Granulocytes: 0.03 10*3/uL (ref 0.00–0.07)
Basophils Absolute: 0 10*3/uL (ref 0.0–0.1)
Basophils Relative: 0 %
Eosinophils Absolute: 0 10*3/uL (ref 0.0–0.5)
Eosinophils Relative: 0 %
HCT: 37.8 % (ref 36.0–46.0)
Hemoglobin: 12.4 g/dL (ref 12.0–15.0)
Immature Granulocytes: 0 %
Lymphocytes Relative: 18 %
Lymphs Abs: 1.7 10*3/uL (ref 0.7–4.0)
MCH: 30.2 pg (ref 26.0–34.0)
MCHC: 32.8 g/dL (ref 30.0–36.0)
MCV: 92 fL (ref 80.0–100.0)
Monocytes Absolute: 0.6 10*3/uL (ref 0.1–1.0)
Monocytes Relative: 7 %
Neutro Abs: 6.7 10*3/uL (ref 1.7–7.7)
Neutrophils Relative %: 75 %
Platelets: 234 10*3/uL (ref 150–400)
RBC: 4.11 MIL/uL (ref 3.87–5.11)
RDW: 13.2 % (ref 11.5–15.5)
WBC: 9 10*3/uL (ref 4.0–10.5)
nRBC: 0 % (ref 0.0–0.2)

## 2020-12-22 LAB — RESP PANEL BY RT-PCR (FLU A&B, COVID) ARPGX2
Influenza A by PCR: NEGATIVE
Influenza B by PCR: NEGATIVE
SARS Coronavirus 2 by RT PCR: NEGATIVE

## 2020-12-22 MED ORDER — GADOBUTROL 1 MMOL/ML IV SOLN
4.5000 mL | Freq: Once | INTRAVENOUS | Status: AC | PRN
  Administered 2020-12-22: 4.5 mL via INTRAVENOUS

## 2020-12-22 MED ORDER — ACETAMINOPHEN 650 MG RE SUPP: 650.0000 mg | Freq: Four times a day (QID) | RECTAL | Status: DC | PRN

## 2020-12-22 MED ORDER — ONDANSETRON HCL 4 MG PO TABS: 4.0000 mg | ORAL_TABLET | Freq: Four times a day (QID) | ORAL | Status: DC | PRN

## 2020-12-22 MED ORDER — CROMOLYN SODIUM 5.2 MG/ACT NA AERS: 1.0000 | INHALATION_SPRAY | NASAL | Status: DC | PRN

## 2020-12-22 MED ORDER — ONDANSETRON HCL 4 MG/2ML IJ SOLN: 4.0000 mg | Freq: Four times a day (QID) | INTRAMUSCULAR | Status: DC | PRN

## 2020-12-22 MED ORDER — SENNOSIDES-DOCUSATE SODIUM 8.6-50 MG PO TABS: 1.0000 | ORAL_TABLET | Freq: Every evening | ORAL | Status: DC | PRN

## 2020-12-22 MED ORDER — SODIUM CHLORIDE (PF) 0.9 % IJ SOLN
INTRAMUSCULAR | Status: AC
  Filled 2020-12-22: qty 50

## 2020-12-22 MED ORDER — ACETAMINOPHEN 325 MG PO TABS: 650.0000 mg | ORAL_TABLET | Freq: Four times a day (QID) | ORAL | Status: DC | PRN

## 2020-12-22 MED ORDER — AZELAIC ACID 15 % EX GEL: 1.0000 "application " | Freq: Every day | CUTANEOUS | Status: DC

## 2020-12-22 MED ORDER — GABAPENTIN 300 MG PO CAPS
300.0000 mg | ORAL_CAPSULE | ORAL | Status: DC
  Administered 2020-12-22 – 2020-12-24 (×4): 300 mg via ORAL
  Filled 2020-12-22 (×4): qty 1

## 2020-12-22 MED ORDER — GABAPENTIN 300 MG PO CAPS: 300.0000 mg | ORAL_CAPSULE | Freq: Three times a day (TID) | ORAL | Status: DC

## 2020-12-22 MED ORDER — IOHEXOL 350 MG/ML SOLN
80.0000 mL | Freq: Once | INTRAVENOUS | Status: AC | PRN
  Administered 2020-12-22: 80 mL via INTRAVENOUS

## 2020-12-22 MED ORDER — LEVETIRACETAM 500 MG PO TABS
500.0000 mg | ORAL_TABLET | Freq: Two times a day (BID) | ORAL | Status: DC
  Administered 2020-12-22 – 2020-12-24 (×4): 500 mg via ORAL
  Filled 2020-12-22 (×4): qty 1

## 2020-12-22 MED ORDER — GABAPENTIN 300 MG PO CAPS
600.0000 mg | ORAL_CAPSULE | Freq: Every morning | ORAL | Status: DC
  Administered 2020-12-23 – 2020-12-24 (×2): 600 mg via ORAL
  Filled 2020-12-22 (×2): qty 2

## 2020-12-22 NOTE — ED Triage Notes (Addendum)
Patient reports increased dizziness and sttes she had a seizure yesterday and this morning. Patient's husband stated that she remained standing after having the seizures. Patient wrote on paper that her seizure activity was in her left jaw, could not swallow and slurred speech and only lasted a minute or two.  Patient also reports that she is having a fibromyalgia flare up x 2 weeks.

## 2020-12-22 NOTE — ED Provider Notes (Addendum)
[facility] DEPT Provider Note   CSN: [id number] Arrival date & time: 12/22/20  1021     History Chief Complaint  Patient presents with   Dizziness   Seizures    [name] [name] is a 70 y.o. female.  HPI  70 year old female past medical history of remote breast cancer, H3 peritoneal cancer status postchemotherapy currently in remission, fibromyalgia presents the emergency department with concern for multiple complaints.  Patient states over the past couple months she has been having worsening confusion, brain fog, dizziness, difficulty walking and changes in vision.  She describes the changes in his vision as intermittent blurriness in both eyes, no vision loss.  Denies any headache or neck pain.  She presents today because last night and today she had to episodes which she describes as seizures.  She states for about a minute the left side of her face was twitching uncontrollably and she was able to swallow with slurred speech.  States that these resolved.  There was no tongue biting, incontinence or postictal period.  She is never had symptoms like this before.  Denies any recent fever or illness.  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), unspecified site    DCIS   Mass of lung    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 Arlyce Dice   Mitral valve disorders(424.0)    No  antibiotics required   Osteoporosis 12/2016   T score -2.3, 2014 T score -2.5 AP spine   PAC (premature atrial contraction) 2012   ,PVC's, and nonsustained atril tachycardia w aberration by heart monitor    PVC (premature ventricular contraction)    Rosacea    Stricture and stenosis of esophagus     Patient Active Problem List   Diagnosis Date Noted   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   Anemia, chronic disease 05/30/2020   Osteoporosis 05/30/2020   Preventive measure 12/05/2019   Physical debility 06/17/2019   Tachycardia 05/17/2019   Rosacea 05/17/2019   Lesion of lung 05/16/2019   Genetic testing 05/16/2019   Mucositis due to chemotherapy 04/25/2019   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/21/2019   Goals of care, counseling/discussion 03/21/2019   Family history of ovarian cancer    Family history of uterine cancer    Family history of melanoma    Family history of prostate cancer    Family history of pancreatic cancer    Family history of colon cancer    Anemia due to antineoplastic chemotherapy 01/18/2019   Bone pain 12/27/2018   Glaucoma, narrow-angle    Peritoneal carcinoma ([place]) 11/25/2018   LBBB (left bundle branch block) 05/18/2013   Atrial tachycardia, paroxysmal (HCC) 05/18/2013   PVC (premature ventricular contraction)    PAC (premature atrial contraction)    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   IBS (irritable bowel syndrome) 11/12/2010   Bacterial overgrowth syndrome 11/12/2010   GERD (gastroesophageal reflux disease) 11/12/2010   PERIPHERAL NEUROPATHY 04/17/2008   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   ESOPHAGEAL STRICTURE 04/17/2008   Diaphragmatic hernia 04/17/2008   NECK PAIN 04/17/2008    Past Surgical History:  Procedure Laterality Date   BREAST LUMPECTOMY  1998   right breast with radiation   CERVICAL DISCECTOMY     C3-7 with bone graft, in three different surgeries, have titanium plate and screws   GYNECOLOGIC  CRYOSURGERY  1980   IR IMAGING GUIDED PORT INSERTION  12/24/2018   NECK SURGERY  1993-1997   bone graft and fusion   REFRACTIVE SURGERY     narrow angle glucoma   RHINOPLASTY  1976   TUBAL LIGATION  1977     OB History     Gravida  0   Para      Term      Preterm      AB      Living         SAB      IAB      Ectopic      Multiple      Live Births              Family History  Problem Relation Age of Onset   Heart disease Mother    Depression Mother    Hypertension Mother    Heart disease Father    Hypertension Father    Aneurysm Father    Uterine cancer Sister 30   Hypertension Sister    Ovarian cancer Sister 64   Melanoma Sister 15   Depression Sister    Melanoma Sister 41   Heart disease Paternal Grandfather    Stroke Maternal Grandmother    Cancer Maternal Grandfather 30       Pancreatic   Stroke Paternal Grandmother    Cancer Paternal Uncle        multiple myeloma, prostate cancer and melanoma (separate primaries)   Colon cancer Neg Hx     Social History   Tobacco Use   Smoking status: Never   Smokeless tobacco: Never  Vaping Use   Vaping Use: Never used  Substance Use Topics   Alcohol use: Yes    Alcohol/week: 2.0 standard drinks    Types: 2 Standard drinks or equivalent per week    Comment: Social    Drug use: No    Home Medications Prior to Admission medications   Medication Sig Start Date End Date Taking? Authorizing Provider  ARTIFICIAL TEAR SOLUTION OP Place 1 drop into both eyes daily as needed (dry eyes).    [provider]  Azelaic Acid 15 % cream Apply 1 application topically daily.    [provider]  cholecalciferol (VITAMIN D3) 25 MCG (1000 UNIT) tablet Take 2,000 Units by mouth daily.    [provider]  cromolyn (NASALCROM) 5.2 MG/ACT nasal spray Place 1 spray into the nose as needed for allergies.     [provider]  gabapentin (NEURONTIN) 300 MG capsule Take 300 mg by mouth.  Taking 600 mg in am ;300 mg at lunch; 300 mg at hs. 04/05/13   [provider]  Ketotifen Fumarate (ALLERGY EYE DROPS OP) Place 1 drop into both eyes daily as needed (allergies).    [provider]  lidocaine-prilocaine (EMLA) cream APPLY TOPICALLY AS NEEDED (PORT ACCESS).  03/12/20 03/12/21  [name], MD  loratadine (CLARITIN) 5 MG/5ML syrup Take 5 mg by mouth daily as needed for allergies (bone pain related to chemo).    [provider]  Magnesium 250 MG TABS Take 250 mg by mouth daily.    [provider]  Probiotic Product (PROBIOTIC DAILY PO) Take by mouth.    [provider]  risedronate (ACTONEL) 35 MG tablet Take 35 mg by mouth every 7 (seven) days. with water on empty stomach, nothing by mouth or lie down for next 30 minutes.    [provider]  Sulfacetamide Sodium, Acne, 10 % LOTN Apply 1 application topically at bedtime. 01/04/19   [provider]  VITAMIN E PO Take 50 Int'l Units/day by mouth daily.    [provider]    Allergies    Fentanyl, Lac bovis, Brimonidine tartrate, Milk-related compounds, Other, Oxybutynin chloride, Thimerosal, Tramadol, Avelox [moxifloxacin hcl in nacl], Doxycycline, Erythromycin, Metronidazole, Minocycline, Penicillins, Quinolones, and Sulfonamide derivatives  Review of Systems   Review of Systems  Constitutional:  Negative for chills and fever.  HENT:  Negative for congestion.   Eyes:  Positive for visual disturbance.  Respiratory:  Negative for shortness of breath.   Cardiovascular:  Negative for chest pain.  Gastrointestinal:  Negative for abdominal pain, diarrhea and vomiting.  Genitourinary:  Negative for dysuria.  Skin:  Negative for rash.  Neurological:  Positive for dizziness, seizures, speech difficulty and light-headedness. Negative for tremors, syncope, facial asymmetry, weakness, numbness and headaches.  Psychiatric/Behavioral:  Positive for confusion.    Physical  Exam Updated Vital Signs BP (!) 107/55   Pulse 72   Temp 98.1 F (36.7 C) (Oral)   Resp 17   Ht _0  (1.651 m)   Wt 45.4 kg   SpO2 98%   BMI 16.64 kg/m   Physical Exam Vitals and nursing note reviewed.  Constitutional:      General: She is not in acute distress.    Appearance: Normal appearance.  HENT:     Head: Normocephalic.     Mouth/Throat:     Mouth: Mucous membranes are moist.  Eyes:     Pupils: Pupils are equal, round, and reactive to light.  Cardiovascular:     Rate and Rhythm: Normal rate.  Pulmonary:     Effort: Pulmonary effort is normal. No respiratory distress.  Abdominal:     Palpations: Abdomen is soft.     Tenderness: There is no abdominal tenderness.  Skin:    General: Skin is warm.  Neurological:     Mental Status: She is alert and oriented to person, place, and time. Mental status is at baseline.     Cranial Nerves: No cranial nerve deficit.     Motor: No weakness.  Psychiatric:        Mood and Affect: Mood normal.    ED Results / Procedures / Treatments   Labs (all labs ordered are listed, but only abnormal results are displayed) Labs Reviewed  CBC WITH DIFFERENTIAL/PLATELET  COMPREHENSIVE METABOLIC PANEL    EKG None  Radiology No results found.  Procedures Procedures   Medications Ordered in ED Medications - No data to display  ED Course  I have reviewed the triage vital signs and the nursing notes.  Pertinent labs & imaging results that were available during my care of the patient were reviewed by me and considered in my medical decision making (see chart for details).    MDM Rules/Calculators/A&P  70 year old female presents emergency department with confusion, brain fog, intermittent visual disturbances and more recently focal seizure activity.  Vitals are stable on arrival, she has returned to her baseline mental status.  MRI of the brain unfortunately shows 2 new brain masses that seem to be  metastatic disease.  Spoke with on-call neurologist, Dr. Curly Shores.  She recommends starting antiepileptic medication, no indication for steroids at this time.  She recommends CT of the chest/abdomen/pelvis for evaluation of cancer disease.  She recommends neurosurgery consultation for possible biopsy as well as oncology consultation.  Patient will be medically admitted.  Patient and husband updated.  No further seizure activity while here in the department.  Lab work-up is normal.  Patients evaluation and results requires admission for further treatment and care. Patient agrees with admission plan, offers no new complaints and is stable/unchanged at time of admit.  Attempted to contact the patient's current oncologist Dr. Minette Brine was unable to is a or unavailable on secure chat.  Patient wishes at this oncologist is updated on her admission and diagnosis.  Final Clinical Impression(s) / ED Diagnoses Final diagnoses:  None    Rx / DC Orders ED Discharge Orders     None        Lorelle Gibbs, DO 12/22/20 1606    Caragh Gasper, Alvin Critchley, DO 12/22/20 1608

## 2020-12-22 NOTE — ED Notes (Signed)
Patient transported to CT 

## 2020-12-22 NOTE — Plan of Care (Signed)
Brief telephone note Reason for Consult: Brain mets and focal seizure Requesting Physician: Dr. Dina Rich  History is obtained from: Dr. Dina Rich and chart review   HPI: [name] [name] is a 70 y.o. female with a past medical history significant for peritoneal carcinoma (s/p carboplatinum and Taxol 12/06/2018 through 05/16/2019 as well as resection on 02/17/2019), ductal carcinoma in situ of the breast, anemia of chronic disease,  She follows with Dr. [name] of oncology and was last seen 12/03/2020 at which time she was complaining of significant fatigue felt to be related to her anemia as well as having episodes of dizziness and brain fog (difficulty parking her car) for which neurology consultation was planned.  Today she presents for 2 episodes of left facial twitching slurred speech and inability to swallow lasting 1 minute and self resolving.  MRI brain is revealing for 2 restricted diffusion lesions.  No other significant complaints were reported to me by Dr. Dina Rich or documented in the notes thus far   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), unspecified site    DCIS   Mass of lung    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 Arlyce Dice   Mitral valve disorders(424.0)    No antibiotics required   Osteoporosis 12/2016   T score -2.3, 2014 T score -2.5 AP spine   PAC (premature atrial contraction) 2012    ,PVC's, and nonsustained atril tachycardia w aberration by heart monitor    PVC (premature ventricular contraction)    Rosacea    Stricture and stenosis of esophagus     Current Outpatient Medications:    Azelaic Acid 15 % cream, Apply 1 application topically daily., Disp: , Rfl:    calcium carbonate (OS-CAL - DOSED IN MG OF ELEMENTAL CALCIUM) 1250 (500 Ca) MG tablet, Take 1 tablet by mouth., Disp: , Rfl:    cholecalciferol (VITAMIN D3) 25 MCG (1000 UNIT) tablet, Take 2,000 Units by mouth daily., Disp: , Rfl:    cromolyn (NASALCROM) 5.2 MG/ACT nasal spray, Place 1 spray into the nose as needed for allergies. , Disp: , Rfl:    gabapentin (NEURONTIN) 300 MG capsule, Take 300 mg by mouth. Taking 600 mg in am ;300 mg at lunch; 300 mg at hs., Disp: , Rfl:    Magnesium 250 MG TABS, Take 250 mg by mouth daily., Disp: , Rfl:    Probiotic Product (PROBIOTIC DAILY PO), Take 1 capsule by mouth daily., Disp: , Rfl:    risedronate (ACTONEL) 35 MG tablet, Take 35 mg by mouth every 7 (seven) days. with water on empty stomach, nothing by mouth or lie down for next 30 minutes., Disp: , Rfl:    Sulfacetamide Sodium, Acne, 10 % LOTN, Apply 1 application topically at bedtime., Disp: , Rfl:  VITAMIN E PO, Take 50 Int'l Units/day by mouth daily., Disp: , Rfl:    lidocaine-prilocaine (EMLA) cream, APPLY TOPICALLY AS NEEDED (PORT ACCESS). (Patient not taking: Reported on 12/22/2020), Disp: 30 g, Rfl: 3     I have reviewed labs in epic and the results pertinent to this consultation are:  Basic Metabolic Panel: Recent Labs  Lab 12/22/20 1322  NA 137  K 3.9  CL 99  CO2 29  GLUCOSE 92  BUN 13  CREATININE 0.55  CALCIUM 9.7    CBC: Recent Labs  Lab 12/22/20 1322  WBC 9.0  NEUTROABS 6.7  HGB 12.4  HCT 37.8  MCV 92.0  PLT 234    Coagulation Studies: No results for input(s): LABPROT, INR in the last 72 hours.    I have reviewed the images obtained:  MRI brain personally reviewed, agree with  radiology: Two enhancing parenchymal lesions, likely reflecting intracranial metastatic disease, one measuring 2.5 x 2.2 cm within the posterior right frontal lobe, and the other measuring 1.2 cm within the left parietal lobe. Mild edema surrounding both lesions. Mild chronic small vessel ischemic changes within the cerebral white matter. Mild generalized parenchymal atrophy.    Impression: Focal seizures in the setting of metastatic cancer, patient meeting criteria for focal localization-related epilepsy with high risk of recurrent seizures if antiseizure medications are not started  Recommendations: - Keppra 500 mg BID given her creatinine clearance as below as well as lack of any significant documented psychiatric history Estimated Creatinine Clearance: 45.6 mL/min (by C-G formula based on SCr of 0.55 mg/dL).   CrCl 80 to 130 mL/minute/1.73 m2: 500 mg to 1.5 g every 12 hours.  CrCl 50 to <80 mL/minute/1.73 m2: 500 mg to 1 g every 12 hours.  CrCl 30 to <50 mL/minute/1.73 m2: 250 to 750 mg every 12 hours.  CrCl 15 to <30 mL/minute/1.73 m2: 250 to 500 mg every 12 hours.  CrCl <15 mL/minute/1.73 m2: 250 to 500 mg every 24 hours (expert opinion). - Oncology consult, defer need for neurosurgical consult to them - Routine EEG may be obtained on Monday at [facility] if patient is admitted but could also be completed on an outpatient basis given it would not change management in terms of my recommendations to start an antiseizure medication - Given minimal edema, prefer to hold on steroids for now, but defer to oncology if they would want to start this  Standard seizure precautions to review with patient: Per [facility] statutes, patients with seizures are not allowed to drive until  they have been seizure-free for six months. Use caution when using heavy equipment or power tools. Avoid working on ladders or at heights. Take showers instead of baths. Ensure the water temperature is not too  high on the home water heater. Do not go swimming alone. When caring for infants or small children, sit down when holding, feeding, or changing them to minimize risk of injury to the child in the event you have a seizure.  To reduce risk of seizures, maintain good sleep hygiene avoid alcohol and illicit drug use, take all anti-seizure medications as prescribed.    Lesleigh Noe MD-PhD [facility] [phone] This is a brief telephone consultation only as documented above

## 2020-12-22 NOTE — H&P (Signed)
History and Physical    [name] [name] GDJ:[id number] DOB: 07/12/1950 DOA: 12/22/2020  PCP: Orpah Melter, MD   Patient coming from: Home  I have personally briefly reviewed patient's old medical records in [place]  Chief Complaint: Dizziness/seizures  HPI: [name] [name] is a 70 y.o. female with medical history significant of peritoneal cancer status postchemotherapy currently in remission, PAT, PACs/PVCs, LBBB being followed by cardiology/Dr. Radford Pax, anemia of chronic disease, ductal carcinoma in situ of breast, peripheral neuropathy, fibromyalgia presented with multiple complaints.  Patient states that she has been having worsening confusion, dizziness, fatigue, difficulty walking and changes in vision with brain fog over the last few months.  She had been evaluated by oncology recently who had recommended outpatient neurology evaluation and follow-up.  Denies any headache, fever, nausea, vomiting, neck pain, loss of consciousness.  She presented to the ED today because she had twitching of her left side of her face for 1 to 2 minutes last night and again this morning with some slurring of speech.  There was no tongue biting, bowel or bladder incontinence.  ED Course: MRI of brain showed 2 new brain masses with concern for metastatic disease.  ED provider spoke to on-call neurologist who recommended to start [place] and to obtain CT of the chest/abdomen/pelvis for evaluation of cancer along with neurosurgery consultation for possible biopsy as well as oncology consultation. Hospitalist service was called to evaluate the patient.  Review of Systems: As per HPI otherwise all other systems were reviewed and are negative.   Past Medical History:  Diagnosis Date   Allergic rhinitis    Cervical dysplasia 1980   Cervicalgia    Eczema    Rosacea,dermatitis-Dr Stinehelfer   Esophageal reflux    Family history of colon cancer    Family history of melanoma    Family history of  ovarian cancer    Family history of pancreatic cancer    Family history of prostate cancer    Family history of uterine cancer    Fibromyalgia    GERD (gastroesophageal reflux disease)    Glaucoma, narrow-angle    Heart murmur    Hiatal hernia    Irritable bowel syndrome    LBBB (left bundle branch block)    Left bundle branch block    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), unspecified site    DCIS   Mass of lung    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 Arlyce Dice   Mitral valve disorders(424.0)    No antibiotics required   Osteoporosis 12/2016   T score -2.3, 2014 T score -2.5 AP spine   PAC (premature atrial contraction) 2012   ,PVC's, and nonsustained atril tachycardia w aberration by heart monitor    PVC (premature ventricular contraction)    Rosacea    Stricture and stenosis of esophagus     Past Surgical History:  Procedure Laterality Date   BREAST LUMPECTOMY  1998   right breast with radiation   CERVICAL DISCECTOMY     C3-7 with bone graft, in three different surgeries, have titanium plate and screws   GYNECOLOGIC CRYOSURGERY  1980   IR IMAGING GUIDED PORT INSERTION  12/24/2018   NECK SURGERY  1993-1997   bone graft and fusion   REFRACTIVE SURGERY     narrow angle Fowler     reports that she has never smoked. She has never used smokeless tobacco. She reports current alcohol use of  about 2.0 standard drinks per week. She reports that she does not use drugs.  Allergies  Allergen Reactions   Fentanyl Palpitations    Palpitations , fever ,headahe, N/V  " loud pounding heart beat"    Lac Bovis Diarrhea and Nausea And Vomiting   Brimonidine Tartrate     Other reaction(s): blurred vision   Ditropan [Oxybutynin]    Milk-Related Compounds Diarrhea and Nausea And Vomiting   Other Other (See Comments)   Oxybutynin Chloride Other (See Comments)    Ditropan  Pt doesn't remember    Thimerosal Other (See Comments)    Pt doesn't  remember   Tramadol Other (See Comments)    Pt doesn't remember   Avelox [Moxifloxacin Hcl In Nacl] Rash   Doxycycline Rash   Erythromycin Rash   Metronidazole Rash   Minocycline Rash   Penicillins Rash    Did it involve swelling of the face/tongue/throat, SOB, or low BP? No Did it involve sudden or severe rash/hives, skin peeling, or any reaction on the inside of your mouth or nose? No Did you need to seek medical attention at a hospital or doctor's office? No When did it last happen?      Childhood allergy If all above answers are "NO", may proceed with cephalosporin use.    Quinolones Rash   Sulfonamide Derivatives Rash    Family History  Problem Relation Age of Onset   Heart disease Mother    Depression Mother    Hypertension Mother    Heart disease Father    Hypertension Father    Aneurysm Father    Uterine cancer Sister 57   Hypertension Sister    Ovarian cancer Sister 53   Melanoma Sister 19   Depression Sister    Melanoma Sister 74   Heart disease Paternal Grandfather    Stroke Maternal Grandmother    Cancer Maternal Grandfather 51       Pancreatic   Stroke Paternal Grandmother    Cancer Paternal Uncle        multiple myeloma, prostate cancer and melanoma (separate primaries)   Colon cancer Neg Hx     Prior to Admission medications   Medication Sig Start Date End Date Taking? Authorizing Provider  Azelaic Acid 15 % cream Apply 1 application topically daily.   Yes [provider]  calcium carbonate (OS-CAL - DOSED IN MG OF ELEMENTAL CALCIUM) 1250 (500 Ca) MG tablet Take 1 tablet by mouth.   Yes [provider]  cholecalciferol (VITAMIN D3) 25 MCG (1000 UNIT) tablet Take 2,000 Units by mouth daily.   Yes [provider]  cromolyn (NASALCROM) 5.2 MG/ACT nasal spray Place 1 spray into the nose as needed for allergies.    Yes [provider]  gabapentin (NEURONTIN) 300 MG capsule Take 300 mg by mouth. Taking 600 mg in am ;300 mg  at lunch; 300 mg at hs. 04/05/13  Yes [provider]  Magnesium 250 MG TABS Take 250 mg by mouth daily.   Yes [provider]  Probiotic Product (PROBIOTIC DAILY PO) Take 1 capsule by mouth daily.   Yes [provider]  risedronate (ACTONEL) 35 MG tablet Take 35 mg by mouth every 7 (seven) days. with water on empty stomach, nothing by mouth or lie down for next 30 minutes.   Yes [provider]  Sulfacetamide Sodium, Acne, 10 % LOTN Apply 1 application topically at bedtime. 01/04/19  Yes [provider]  VITAMIN E PO Take 50 Int'l  Units/day by mouth daily.   Yes [provider]  lidocaine-prilocaine (EMLA) cream APPLY TOPICALLY AS NEEDED (PORT ACCESS). Patient not taking: Reported on 12/22/2020 03/12/20 03/12/21  [name], MD    Physical Exam: Vitals:   12/22/20 1200 12/22/20 1230 12/22/20 1400 12/22/20 1623  BP: 133/65 (!) 107/55 117/61 127/62  Pulse: 82 72 82 (!) 114  Resp: _0 Temp:      TempSrc:      SpO2: 98% 98% 97% 99%  Weight:      Height:        Constitutional: NAD, calm, comfortable.  Thinly built female lying in bed. Vitals:   12/22/20 1200 12/22/20 1230 12/22/20 1400 12/22/20 1623  BP: 133/65 (!) 107/55 117/61 127/62  Pulse: 82 72 82 (!) 114  Resp: _1 Temp:      TempSrc:      SpO2: 98% 98% 97% 99%  Weight:      Height:       Eyes: PERRL, lids and conjunctivae normal ENMT: Mucous membranes are moist. Posterior pharynx clear of any exudate or lesions. Neck: normal, supple, no masses, no thyromegaly Respiratory: bilateral decreased breath sounds at bases, no wheezing, no crackles. Normal respiratory effort. No accessory muscle use.  Cardiovascular: S1 S2 positive, rate controlled. No extremity edema. 2+ pedal pulses.  Abdomen: no tenderness, no masses palpated. No hepatosplenomegaly. Bowel sounds positive.  Musculoskeletal: no clubbing / cyanosis. No joint deformity upper and lower  extremities.  Skin: no rashes, lesions, ulcers. No induration Neurologic: CN 2-12 grossly intact. Moving extremities. No focal neurologic deficits.  No current seizures observed. Psychiatric: Normal judgment and insight. Alert and oriented x 3. Normal mood.    Labs on Admission: I have personally reviewed following labs and imaging studies  CBC: Recent Labs  Lab 12/22/20 1322  WBC 9.0  NEUTROABS 6.7  HGB 12.4  HCT 37.8  MCV 92.0  PLT 364   Basic Metabolic Panel: Recent Labs  Lab 12/22/20 1322  NA 137  K 3.9  CL 99  CO2 29  GLUCOSE 92  BUN 13  CREATININE 0.55  CALCIUM 9.7   GFR: Estimated Creatinine Clearance: 45.6 mL/min (by C-G formula based on SCr of 0.55 mg/dL). Liver Function Tests: Recent Labs  Lab 12/22/20 1322  AST 25  ALT 15  ALKPHOS 63  BILITOT 0.8  PROT 7.9  ALBUMIN 4.5   No results for input(s): LIPASE, AMYLASE in the last 168 hours. No results for input(s): AMMONIA in the last 168 hours. Coagulation Profile: No results for input(s): INR, PROTIME in the last 168 hours. Cardiac Enzymes: No results for input(s): CKTOTAL, CKMB, CKMBINDEX, TROPONINI in the last 168 hours. BNP (last 3 results) No results for input(s): PROBNP in the last 8760 hours. HbA1C: No results for input(s): HGBA1C in the last 72 hours. CBG: No results for input(s): GLUCAP in the last 168 hours. Lipid Profile: No results for input(s): CHOL, HDL, LDLCALC, TRIG, CHOLHDL, LDLDIRECT in the last 72 hours. Thyroid Function Tests: No results for input(s): TSH, T4TOTAL, FREET4, T3FREE, THYROIDAB in the last 72 hours. Anemia Panel: No results for input(s): VITAMINB12, FOLATE, FERRITIN, TIBC, IRON, RETICCTPCT in the last 72 hours. Urine analysis:    Component Value Date/Time   COLORURINE YELLOW 03/23/2019 1300   APPEARANCEUR CLEAR 03/23/2019 1300   LABSPEC 1.014 03/23/2019 1300   PHURINE 6.0 03/23/2019 1300   GLUCOSEU NEGATIVE 03/23/2019 1300   HGBUR MODERATE (A) 03/23/2019 1300  BILIRUBINUR NEGATIVE 03/23/2019 1300   KETONESUR NEGATIVE 03/23/2019 1300   PROTEINUR NEGATIVE 03/23/2019 1300   UROBILINOGEN 0.2 08/15/2011 1416   NITRITE NEGATIVE 03/23/2019 1300   LEUKOCYTESUR NEGATIVE 03/23/2019 1300    Radiological Exams on Admission: MR Brain W and Wo Contrast  Result Date: 12/22/2020 CLINICAL DATA:  Dizziness, nonspecific; history of cancer, new seizure, dizziness. EXAM: MRI HEAD WITHOUT AND WITH CONTRAST TECHNIQUE: Multiplanar, multiecho pulse sequences of the brain and surrounding structures were obtained without and with intravenous contrast. CONTRAST:  4.66m GADAVIST GADOBUTROL 1 MMOL/ML IV SOLN COMPARISON:  Brain MRI 03/22/2011. FINDINGS: Brain: Mild generalized cerebral and cerebellar atrophy. Peripherally enhancing centrally cystic/necrotic lesion within the cortical/subcortical posterior right frontal lobe, measuring 2.5 x 2.2 x 2.2 cm (for instance as seen on series 20, image 110) (series 23, image 16). There is mild surrounding edema. There is also minimal SWI signal loss associated with this lesion which may reflect mineralization, nonacute blood products or prominent vessels. Additional 1.2 cm enhancing lesion within the left parietal lobe (for instance as seen on series 23, image 10). Mild edema surrounds this lesion as well. Background mild multifocal T2 FLAIR hyperintense signal abnormality within the cerebral white matter, nonspecific but compatible with chronic small vessel ischemic disease. There is no acute infarct. No extra-axial fluid collection. No midline shift. Vascular: Maintained flow voids within the proximal large arterial vessels. Skull and upper cervical spine: No focal suspicious marrow lesion. Susceptibility artifact arising from cervical spinal fusion hardware. Sinuses/Orbits: Visualized orbits show no acute finding. Trace bilateral ethmoid sinus mucosal thickening. IMPRESSION: Two enhancing parenchymal lesions, likely reflecting intracranial  metastatic disease, one measuring 2.5 x 2.2 cm within the posterior right frontal lobe, and the other measuring 1.2 cm within the left parietal lobe. Mild edema surrounding both lesions. Mild chronic small vessel ischemic changes within the cerebral white matter. Mild generalized parenchymal atrophy. Electronically Signed   By: KKellie SimmeringD.O.   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 -Follow official neurology recommendations.  Patient states that she is already on gabapentin and is worried about being on another seizure medication. -EEG. -Neurology is deferring initiation of steroids to oncology  New diagnosis of metastatic brain tumors -MRI of the brain showed 2 enhancing parenchymal lesions with mild edema surrounding both lesions. -I have communicated with Dr. Vaslow/neurooncology who recommended to get opinion of neurosurgery.  I have communicated with [facility]/neurosurgery: We will wait for recommendations -I have also communicated with the primary oncology team/Dr. EMarin Olp covering for Dr. GAlvy Bimler History of peritoneal cancer status postchemotherapy currently in remission History of ductal carcinoma in situ of breast -Follows up with Dr. GAlvy Bimleras an outpatient.  Currently in remission. -Follow CT of the chest/abdomen/pelvis.  DVT prophylaxis: SCDs/avoid Lovenox in case patient needs any neurosurgical intervention Code Status: Full Family Communication: Husband at bedside Disposition Plan: Home in 1 to 2 days once clinically improves Consults called: Neurology by ED provider.  Communicated on phone with Dr. Vaslow/neurooncology.  Consulted neurosurgery and notified oncology Admission status: Observation/telemetry  Severity of Illness: The appropriate patient status for this patient is OBSERVATION. Observation status is judged  to be reasonable and necessary in order to provide the required intensity of service to ensure the patient's safety. The patient's presenting symptoms, physical exam findings, and initial radiographic and laboratory data in the context of their medical  condition is felt to place them at decreased risk for further clinical deterioration. Furthermore, it is anticipated that the patient will be medically stable for discharge from the hospital within 2 midnights of admission. The following factors support the patient status of observation.   " The patient's presenting symptoms include dizziness/seizures. " The physical exam findings include no evidence of any seizures. " The initial radiographic and laboratory data are new brain lesions.    Aline August MD [facility]  12/22/2020, 4:41 PM

## 2020-12-22 NOTE — ED Notes (Signed)
Patient transported to MRI 

## 2020-12-23 DIAGNOSIS — C801 Malignant (primary) neoplasm, unspecified: Secondary | ICD-10-CM | POA: Diagnosis not present

## 2020-12-23 DIAGNOSIS — R42 Dizziness and giddiness: Secondary | ICD-10-CM | POA: Diagnosis not present

## 2020-12-23 DIAGNOSIS — G9389 Other specified disorders of brain: Secondary | ICD-10-CM | POA: Diagnosis not present

## 2020-12-23 DIAGNOSIS — Z86000 Personal history of in-situ neoplasm of breast: Secondary | ICD-10-CM | POA: Diagnosis not present

## 2020-12-23 DIAGNOSIS — C7931 Secondary malignant neoplasm of brain: Secondary | ICD-10-CM | POA: Diagnosis not present

## 2020-12-23 DIAGNOSIS — R569 Unspecified convulsions: Secondary | ICD-10-CM | POA: Diagnosis not present

## 2020-12-23 DIAGNOSIS — C482 Malignant neoplasm of peritoneum, unspecified: Secondary | ICD-10-CM | POA: Diagnosis not present

## 2020-12-23 LAB — CBC
HCT: 38.1 % (ref 36.0–46.0)
Hemoglobin: 12.7 g/dL (ref 12.0–15.0)
MCH: 30.5 pg (ref 26.0–34.0)
MCHC: 33.3 g/dL (ref 30.0–36.0)
MCV: 91.6 fL (ref 80.0–100.0)
Platelets: 201 10*3/uL (ref 150–400)
RBC: 4.16 MIL/uL (ref 3.87–5.11)
RDW: 13.1 % (ref 11.5–15.5)
WBC: 5.9 10*3/uL (ref 4.0–10.5)
nRBC: 0 % (ref 0.0–0.2)

## 2020-12-23 LAB — COMPREHENSIVE METABOLIC PANEL
ALT: 18 U/L (ref 0–44)
AST: 23 U/L (ref 15–41)
Albumin: 4.1 g/dL (ref 3.5–5.0)
Alkaline Phosphatase: 58 U/L (ref 38–126)
Anion gap: 8 (ref 5–15)
BUN: 13 mg/dL (ref 8–23)
CO2: 28 mmol/L (ref 22–32)
Calcium: 9.7 mg/dL (ref 8.9–10.3)
Chloride: 103 mmol/L (ref 98–111)
Creatinine, Ser: 0.52 mg/dL (ref 0.44–1.00)
GFR, Estimated: >60 mL/min (ref >60)
Glucose, Bld: 93 mg/dL (ref 70–99)
Potassium: 4.2 mmol/L (ref 3.5–5.1)
Sodium: 139 mmol/L (ref 135–145)
Total Bilirubin: 0.6 mg/dL (ref 0.3–1.2)
Total Protein: 7.6 g/dL (ref 6.5–8.1)

## 2020-12-23 LAB — MAGNESIUM: Magnesium: 2 mg/dL (ref 1.7–2.4)

## 2020-12-23 MED ORDER — SODIUM CHLORIDE 0.9% FLUSH: 10.0000 mL | INTRAVENOUS | Status: DC | PRN

## 2020-12-23 MED ORDER — SULFACETAMIDE SODIUM (ACNE) 10 % EX LOTN
1.0000 "application " | TOPICAL_LOTION | Freq: Two times a day (BID) | CUTANEOUS | Status: DC
  Administered 2020-12-23 – 2020-12-24 (×2): 1 via CUTANEOUS
  Filled 2020-12-23: qty 1

## 2020-12-23 MED ORDER — CHLORHEXIDINE GLUCONATE CLOTH 2 % EX PADS
6.0000 | MEDICATED_PAD | Freq: Every day | CUTANEOUS | Status: DC
  Administered 2020-12-23 – 2020-12-24 (×2): 6 via TOPICAL

## 2020-12-23 NOTE — Consult Note (Signed)
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  HPI: [name] is very charming.  She is a 70 year old white female.  She has been followed by Dr. Alvy Bimler.  She has a remote history of breast cancer.  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  She says she had a lumpectomy and radiation for this.  This was back in 1998.  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  She was treated with chemotherapy.  She did have surgery.  She was last seen by Dr. Alvy Bimler in September.  And seem like everything was doing okay.  She was having some neurological issues at the time.  Of note, at that time, her CA125 was 10.2.  She subsequently came to the hospital with mental status changes.  She had MRI that was done.  This did show a 2.5 x 2.2 by 2 x 2 centimeter mass in the right frontal lobe.  There is a additional 1.2 cm mass in the left parietal lobe.  She had a CT of the body done.  This showed a right middle lobe focus of consolidation measuring 3.6 x 2.3 cm.  This is been noted previously.  She did have a 1.1 cm high anterior mediastinal lymph node.  There is also a left retroperitoneal para aortic lymph node measuring 2.3 x 1.5 cm.  These were all suspicious for recurrent disease.  She sounds pretty good.  She seems to be in pretty good shape.  She has been eating okay.  She has had no nausea or vomiting.  She does have fibromyalgia and has having a flareup right now.  There is no cough.  There is been no visual changes.  Overall, I would say her performance status is probably ECOG 1.  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), unspecified site    DCIS   Mass of lung    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 Arlyce Dice   Mitral valve disorders(424.0)    No antibiotics required   Osteoporosis 12/2016   T score -2.3, 2014 T score -2.5 AP spine   PAC (premature atrial contraction) 2012   ,PVC's, and nonsustained atril tachycardia w aberration by heart monitor    PVC (premature ventricular contraction)    Rosacea    Stricture and stenosis of esophagus   :   Past Surgical History:  Procedure Laterality Date   BREAST LUMPECTOMY  1998   right breast with radiation   CERVICAL DISCECTOMY     C3-7 with bone graft, in three different surgeries, have titanium plate and screws  GYNECOLOGIC CRYOSURGERY  1980   IR IMAGING GUIDED PORT INSERTION  12/24/2018   NECK SURGERY  1993-1997   bone graft and fusion   REFRACTIVE SURGERY     narrow angle glucoma   RHINOPLASTY  1976   TUBAL LIGATION  1977  :   Current Facility-Administered Medications:    acetaminophen (TYLENOL) tablet 650 mg, 650 mg, Oral, Q6H PRN **OR** acetaminophen (TYLENOL) suppository 650 mg, 650 mg, Rectal, Q6H PRN, Alekh, Kshitiz, MD   Azelaic Acid 15 % 1 application, 1 application, Topical, Daily, Alekh, Kshitiz, MD   Chlorhexidine Gluconate Cloth 2 % PADS 6 each, 6 each, Topical, Daily, Alekh, Kshitiz, MD   cromolyn (NASALCROM) nasal spray 1 spray, 1 spray, Each Nare, PRN, Starla Link, Kshitiz, MD   gabapentin (NEURONTIN) capsule 600 mg, 600 mg, Oral, q AM, 600 mg at 12/23/20 0622 **AND** gabapentin (NEURONTIN) capsule 300 mg, 300 mg, Oral, 2 times per day, Starla Link,  Kshitiz, MD, 300 mg at 12/22/20 2123   levETIRAcetam (KEPPRA) tablet 500 mg, 500 mg, Oral, BID, Starla Link, Kshitiz, MD, 500 mg at 12/22/20 2123   ondansetron (ZOFRAN) tablet 4 mg, 4 mg, Oral, Q6H PRN **OR** ondansetron (ZOFRAN) injection 4 mg, 4 mg, Intravenous, Q6H PRN, Alekh, Kshitiz, MD   senna-docusate (Senokot-S) tablet 1 tablet, 1 tablet, Oral, QHS PRN, Alekh, Kshitiz, MD   sodium chloride flush (NS) 0.9 % injection 10-40 mL, 10-40 mL, Intracatheter, PRN, Starla Link, Kshitiz, MD:   Azelaic Acid  1 application Topical Daily   Chlorhexidine Gluconate Cloth  6 each Topical Daily   gabapentin  600 mg Oral q AM   And   gabapentin  300 mg Oral 2 times per day   levETIRAcetam  500 mg Oral BID  :   Allergies  Allergen Reactions   Fentanyl Palpitations    Palpitations , fever ,headahe, N/V  " loud pounding heart beat"    Lac Bovis Diarrhea and Nausea And Vomiting   Brimonidine Tartrate     Other reaction(s): blurred vision   Ditropan [Oxybutynin]    Milk-Related Compounds Diarrhea and Nausea And Vomiting   Other Other (See Comments)   Oxybutynin Chloride Other (See Comments)    Ditropan  Pt doesn't remember    Thimerosal Other (See Comments)    Pt doesn't remember   Tramadol Other (See Comments)    Pt doesn't remember   Avelox [Moxifloxacin Hcl In Nacl] Rash   Doxycycline Rash   Erythromycin Rash   Metronidazole Rash   Minocycline Rash   Penicillins Rash    Did it involve swelling of the face/tongue/throat, SOB, or low BP? No Did it involve sudden or severe rash/hives, skin peeling, or any reaction on the inside of your mouth or nose? No Did you need to seek medical attention at a hospital or doctor's office? No When did it last happen?      Childhood allergy If all above answers are "NO", may proceed with cephalosporin use.    Quinolones Rash   Sulfonamide Derivatives Rash  :   Family History  Problem Relation Age of Onset   Heart disease Mother    Depression Mother     Hypertension Mother    Heart disease Father    Hypertension Father    Aneurysm Father    Uterine cancer Sister 70   Hypertension Sister    Ovarian cancer Sister 30   Melanoma Sister 59   Depression Sister    Melanoma Sister 31   Heart disease Paternal Grandfather  Stroke Maternal Grandmother    Cancer Maternal Grandfather 65       Pancreatic   Stroke Paternal Grandmother    Cancer Paternal Uncle        multiple myeloma, prostate cancer and melanoma (separate primaries)   Colon cancer Neg Hx   :   Social History   Socioeconomic History   Marital status: Married    Spouse name: Antony Haste   Number of children: 0   Years of education: Not on file   Highest education level: Not on file  Occupational History   Occupation: Teacher, early years/pre: [employer]  Tobacco Use   Smoking status: Never   Smokeless tobacco: Never  Vaping Use   Vaping Use: Never used  Substance and Sexual Activity   Alcohol use: Yes    Alcohol/week: 2.0 standard drinks    Types: 2 Standard drinks or equivalent per week    Comment: Social    Drug use: No   Sexual activity: Not Currently    Birth control/protection: Post-menopausal    Comment: 1st intercourse 70 yo- More than 5 partners  Other Topics Concern   Not on file  Social History Narrative   Not on file   Social Determinants of Health   Financial Resource Strain: Not on file  Food Insecurity: Not on file  Transportation Needs: Not on file  Physical Activity: Not on file  Stress: Not on file  Social Connections: Not on file  Intimate Partner Violence: Not on file  :  Review of Systems  Constitutional: Negative.   HENT: Negative.    Eyes: Negative.   Respiratory: Negative.    Cardiovascular: Negative.   Gastrointestinal: Negative.   Genitourinary: Negative.   Musculoskeletal:  Positive for joint pain and myalgias.  Skin: Negative.   Neurological:  Positive for dizziness and headaches.  Endo/Heme/Allergies: Negative.    Psychiatric/Behavioral: Negative.      Exam: Patient Vitals for the past 24 hrs:  BP Temp Temp src Pulse Resp SpO2 Height Weight  12/23/20 0608 (!) 99/36 97.8 F (36.6 C) Oral 70 18 99 % -- --  12/23/20 0100 (!) 97/45 (!) 97.5 F (36.4 C) Oral 67 18 94 % -- --  12/22/20 2047 (!) 111/50 98 F (36.7 C) Oral 85 18 95 % -- --  12/22/20 1832 (!) 142/69 99.3 F (37.4 C) Oral 85 18 98 % -- --  12/22/20 1821 -- -- -- -- -- -- '5\' 5"'  (1.651 m) 100 lb 12.8 oz (45.7 kg)  12/22/20 1645 124/89 -- -- (!) 101 17 100 % -- --  12/22/20 1623 127/62 -- -- (!) 114 19 99 % -- --  12/22/20 1400 117/61 -- -- 82 16 97 % -- --  12/22/20 1230 (!) 107/55 -- -- 72 17 98 % -- --  12/22/20 1200 133/65 -- -- 82 18 98 % -- --  12/22/20 1058 -- -- -- -- -- -- '5\' 5"'  (1.651 m) 100 lb (45.4 kg)  12/22/20 1056 (!) 142/74 98.1 F (36.7 C) Oral 97 18 98 % -- --   Physical Exam Vitals reviewed.  HENT:     Head: Normocephalic and atraumatic.  Eyes:     Pupils: Pupils are equal, round, and reactive to light.  Cardiovascular:     Rate and Rhythm: Normal rate and regular rhythm.     Heart sounds: Normal heart sounds.  Pulmonary:     Effort: Pulmonary effort is normal.     Breath sounds: Normal  breath sounds.  Abdominal:     General: Bowel sounds are normal.     Palpations: Abdomen is soft.  Musculoskeletal:        General: No tenderness or deformity. Normal range of motion.     Cervical back: Normal range of motion.  Lymphadenopathy:     Cervical: No cervical adenopathy.  Skin:    General: Skin is warm and dry.     Findings: No erythema or rash.  Neurological:     Mental Status: She is alert and oriented to person, place, and time.  Psychiatric:        Behavior: Behavior normal.        Thought Content: Thought content normal.        Judgment: Judgment normal.      Recent Labs    12/22/20 1322 12/23/20 0522  WBC 9.0 5.9  HGB 12.4 12.7  HCT 37.8 38.1  PLT 234 201    Recent Labs     12/22/20 1322 12/23/20 0522  NA 137 139  K 3.9 4.2  CL 99 103  CO2 29 28  GLUCOSE 92 93  BUN 13 13  CREATININE 0.55 0.52  CALCIUM 9.7 9.7    Blood smear review: None  Pathology: None  MR Brain W and Wo Contrast  Result Date: 12/22/2020 CLINICAL DATA:  Dizziness, nonspecific; history of cancer, new seizure, dizziness. EXAM: MRI HEAD WITHOUT AND WITH CONTRAST TECHNIQUE: Multiplanar, multiecho pulse sequences of the brain and surrounding structures were obtained without and with intravenous contrast. CONTRAST:  4.12m GADAVIST GADOBUTROL 1 MMOL/ML IV SOLN COMPARISON:  Brain MRI 03/22/2011. FINDINGS: Brain: Mild generalized cerebral and cerebellar atrophy. Peripherally enhancing centrally cystic/necrotic lesion within the cortical/subcortical posterior right frontal lobe, measuring 2.5 x 2.2 x 2.2 cm (for instance as seen on series 20, image 110) (series 23, image 16). There is mild surrounding edema. There is also minimal SWI signal loss associated with this lesion which may reflect mineralization, nonacute blood products or prominent vessels. Additional 1.2 cm enhancing lesion within the left parietal lobe (for instance as seen on series 23, image 10). Mild edema surrounds this lesion as well. Background mild multifocal T2 FLAIR hyperintense signal abnormality within the cerebral white matter, nonspecific but compatible with chronic small vessel ischemic disease. There is no acute infarct. No extra-axial fluid collection. No midline shift. Vascular: Maintained flow voids within the proximal large arterial vessels. Skull and upper cervical spine: No focal suspicious marrow lesion. Susceptibility artifact arising from cervical spinal fusion hardware. Sinuses/Orbits: Visualized orbits show no acute finding. Trace bilateral ethmoid sinus mucosal thickening. IMPRESSION: Two enhancing parenchymal lesions, likely reflecting intracranial metastatic disease, one measuring 2.5 x 2.2 cm within the posterior  right frontal lobe, and the other measuring 1.2 cm within the left parietal lobe. Mild edema surrounding both lesions. Mild chronic small vessel ischemic changes within the cerebral white matter. Mild generalized parenchymal atrophy. Electronically Signed   By: KKellie SimmeringD.O.   On: 12/22/2020 15:16   CT CHEST ABDOMEN PELVIS W CONTRAST  Result Date: 12/22/2020 CLINICAL DATA:  History of primary peritoneal carcinoma diagnosed September 2020 status post chemotherapy, TAHBSO, radical tumor debulking and omentectomy 02/17/2019. Additional history of breast cancer. Patient presents with new headache with 2 new brain masses. Restaging. EXAM: CT CHEST, ABDOMEN, AND PELVIS WITH CONTRAST TECHNIQUE: Multidetector CT imaging of the chest, abdomen and pelvis was performed following the standard protocol during bolus administration of intravenous contrast. CONTRAST:  878mOMNIPAQUE IOHEXOL 350 MG/ML SOLN COMPARISON:  06/16/2019 CT chest, abdomen and pelvis. FINDINGS: CT CHEST FINDINGS Cardiovascular: Normal heart size. No significant pericardial effusion/thickening. Right internal jugular Port-A-Cath terminates at the cavoatrial junction. Atherosclerotic nonaneurysmal thoracic aorta. Normal caliber pulmonary arteries. No central pulmonary emboli. Mediastinum/Nodes: No discrete thyroid nodules. Unremarkable esophagus. No axillary adenopathy. Newly enlarged 1.1 cm high anterior mediastinal midline node (series 2/image 16). No hilar adenopathy. Lungs/Pleura: No pneumothorax. No pleural effusion. Right middle lobe masslike focus of consolidation measuring 3.6 x 2.3 cm (series 4/image 84), previously 3.5 x 2.1 cm on 06/16/2019 CT and 3.6 x 2.2 cm on 01/31/2019 CT using similar measurement technique, not substantially changed. No acute consolidative airspace disease or new significant pulmonary nodules. Musculoskeletal:  No aggressive appearing focal osseous lesions. CT ABDOMEN PELVIS FINDINGS Hepatobiliary: Normal liver with no  liver mass. Normal gallbladder with no radiopaque cholelithiasis. No biliary ductal dilatation. Pancreas: Normal, with no mass or duct dilation. Spleen: Normal size. No mass. Adrenals/Urinary Tract: Normal adrenals. Normal kidneys with no hydronephrosis and no renal mass. Normal bladder. Stomach/Bowel: Normal non-distended stomach. Normal caliber small bowel with no small bowel wall thickening. Normal appendix. Normal large bowel with no diverticulosis, large bowel wall thickening or pericolonic fat stranding. Vascular/Lymphatic: Atherosclerotic nonaneurysmal abdominal aorta. Patent portal, splenic, hepatic and renal veins. Enlarged high left retroperitoneal para-aortic additional 2.3 x 1.5 cm node (series 2/image 66), new. No pathologically enlarged lymph nodes in the abdomen or pelvis. Reproductive: Status post hysterectomy, with no abnormal findings at the vaginal cuff. No adnexal mass. Other: No pneumoperitoneum, ascites or focal fluid collection. Musculoskeletal: No aggressive appearing focal osseous lesions. IMPRESSION: 1. New high anterior mediastinal and high left retroperitoneal lymphadenopathy, suspicious for metastatic disease. 2. Chronic right middle lobe masslike focus of consolidation, unchanged back to 01/31/2019 chest CT, indeterminate although likely postinfectious/postinflammatory scarring. 3. Aortic Atherosclerosis (ICD10-I70.0). Electronically Signed   By: Ilona Sorrel M.D.   On: 12/22/2020 19:16    Assessment and Plan: [name] is a very nice 70 year old white female.  She has a history of primary peritoneal carcinoma.  She was treated aggressively.  She had chemotherapy.  She had surgery.  She was in remission.  I just have a hard time believing that this is come back into the brain.  It is very unusual to see peritoneal carcinoma recurrent in the brain.  She has 2 areas in the brain that appear to be metastatic.  She has some lymphadenopathy which is mild at best in the chest and  abdomen.  It will be interesting to see what the CA-125 is.  I would think that if there is no role for surgery to resect out these brain mets, then stereotactic radiosurgery would be a reasonable way to go.  If she does have systemic disease, she will need systemic therapy again.  I will let Dr. Alvy Bimler know her admission.  I am sure that she will get together with her colleagues and come up with a good game plan for [name].  Ms. Sickles she has been in good shape.  She certainly could tolerate aggressive therapy.  Lattie Haw, MD  Jeneen Rinks 1:5-6

## 2020-12-23 NOTE — Progress Notes (Signed)
Patient ID: [name] [name], female   DOB: 1950-09-04, 70 y.o.   MRN: [id number]  PROGRESS NOTE    ADRYAN DRUCKENMILLER  MLY:[id number] DOB: February 06, 1951 DOA: 12/22/2020 PCP: Orpah Melter, MD   Brief Narrative:  70 y.o. female with medical history significant of peritoneal cancer status postchemotherapy currently in remission, PAT, PACs/PVCs, LBBB being followed by cardiology/Dr. Radford Pax, anemia of chronic disease, ductal carcinoma in situ of breast, peripheral neuropathy, fibromyalgia presented with dizziness and seizures.  On presentation, MRI of brain showed 2 new brain masses with concern for metastatic disease.  ED provider spoke to on-call neurologist who recommended to start [facility] and to obtain CT of the chest/abdomen/pelvis for evaluation of cancer along with neurosurgery consultation for possible biopsy as well as oncology consultation.  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 Bhagat/neurology on phone on 12/22/2020 who has reviewed the chart and imaging and has recommended Keppra 500 mg twice a day.  Patient has already been started on the same.  Outpatient follow-up with neurology.  No need for any steroids at this time as per neurology. -EEG for tomorrow -No seizures since admission.  Continue monitoring mental status.   New diagnosis of metastatic brain tumors -MRI of the brain showed 2 enhancing parenchymal lesions with mild edema surrounding both lesions. -I have communicated with Dr. Vaslow/neurooncology who recommended to get opinion of neurosurgery.   -I have communicated with Dawley/neurosurgery via secure chat who was in favor of radiation of these lesions unless her seizures become uncontrolled.  Await oncology evaluation and recommendations.   History of peritoneal cancer status postchemotherapy currently in remission History of ductal carcinoma in situ of breast -Follows up with Dr. Alvy Bimler as an outpatient.  Currently in  remission. -CT of the chest/abdomen/pelvis showed mediastinal lymphadenopathy and retroperitoneal lymphadenopathy.  Follow oncology recommendations  DVT prophylaxis: SCDs/avoid Lovenox  Code Status: Full Family Communication: Husband and sister at bedside Disposition Plan: Status is: Observation  The patient will require care spanning > 2 midnights and should be moved to inpatient because: Inpatient level of care appropriate due to severity of illness  Dispo: The patient is from: Home              Anticipated d/c is to: Home              Patient currently is not medically stable to d/c.   Difficult to place patient No   Consultants: Neurology/neurosurgery/oncology  Procedures: None  Antimicrobials: None   Subjective: Patient seen and examined at bedside.  Denies worsening headache, nausea, vomiting or overnight seizures.  Objective: Vitals:   12/22/20 1832 12/22/20 2047 12/23/20 0100 12/23/20 0608  BP: (!) 142/69 (!) 111/50 (!) 97/45 (!) 99/36  Pulse: 85 85 67 70  Resp: 18 18 18 18   Temp: 99.3 F (37.4 C) 98 F (36.7 C) (!) 97.5 F (36.4 C) 97.8 F (36.6 C)  TempSrc: Oral Oral Oral Oral  SpO2: 98% 95% 94% 99%  Weight:      Height:       No intake or output data in the 24 hours ending 12/23/20 1048 Filed Weights   12/22/20 1058 12/22/20 1821  Weight: 45.4 kg 45.7 kg    Examination:  General exam: Appears calm and comfortable  Respiratory system: Bilateral decreased breath sounds at bases Cardiovascular system: S1 & S2 heard, Rate controlled Gastrointestinal system: Abdomen is nondistended, soft and nontender. Normal bowel sounds heard. Extremities: No cyanosis, clubbing, edema  Central nervous system: Alert and oriented. No focal neurological deficits. Moving extremities Skin: No rashes, lesions or ulcers Psychiatry: Judgement and insight appear normal. Mood & affect appropriate.     Data Reviewed: I have personally reviewed following labs and imaging  studies  CBC: Recent Labs  Lab 12/22/20 1322 12/23/20 0522  WBC 9.0 5.9  NEUTROABS 6.7  --   HGB 12.4 12.7  HCT 37.8 38.1  MCV 92.0 91.6  PLT 234 939   Basic Metabolic Panel: Recent Labs  Lab 12/22/20 1322 12/23/20 0522  NA 137 139  K 3.9 4.2  CL 99 103  CO2 29 28  GLUCOSE 92 93  BUN 13 13  CREATININE 0.55 0.52  CALCIUM 9.7 9.7  MG  --  2.0   GFR: Estimated Creatinine Clearance: 45.9 mL/min (by C-G formula based on SCr of 0.52 mg/dL). Liver Function Tests: Recent Labs  Lab 12/22/20 1322 12/23/20 0522  AST 25 23  ALT 15 18  ALKPHOS 63 58  BILITOT 0.8 0.6  PROT 7.9 7.6  ALBUMIN 4.5 4.1   No results for input(s): LIPASE, AMYLASE in the last 168 hours. No results for input(s): AMMONIA in the last 168 hours. Coagulation Profile: No results for input(s): INR, PROTIME in the last 168 hours. Cardiac Enzymes: No results for input(s): CKTOTAL, CKMB, CKMBINDEX, TROPONINI in the last 168 hours. BNP (last 3 results) No results for input(s): PROBNP in the last 8760 hours. HbA1C: No results for input(s): HGBA1C in the last 72 hours. CBG: No results for input(s): GLUCAP in the last 168 hours. Lipid Profile: No results for input(s): CHOL, HDL, LDLCALC, TRIG, CHOLHDL, LDLDIRECT in the last 72 hours. Thyroid Function Tests: No results for input(s): TSH, T4TOTAL, FREET4, T3FREE, THYROIDAB in the last 72 hours. Anemia Panel: No results for input(s): VITAMINB12, FOLATE, FERRITIN, TIBC, IRON, RETICCTPCT in the last 72 hours. Sepsis Labs: No results for input(s): PROCALCITON, LATICACIDVEN in the last 168 hours.  Recent Results (from the past 240 hour(s))  Resp Panel by RT-PCR (Flu A&B, Covid) Nasopharyngeal Swab     Status: None   Collection Time: 12/22/20  4:25 PM   Specimen: Nasopharyngeal Swab; Nasopharyngeal(NP) swabs in vial transport medium  Result Value Ref Range Status   SARS Coronavirus 2 by RT PCR NEGATIVE NEGATIVE Final    Comment: (NOTE) SARS-CoV-2 target  nucleic acids are NOT DETECTED.  The SARS-CoV-2 RNA is generally detectable in upper respiratory specimens during the acute phase of infection. The lowest concentration of SARS-CoV-2 viral copies this assay can detect is 138 copies/mL. A negative result does not preclude SARS-Cov-2 infection and should not be used as the sole basis for treatment or other patient management decisions. A negative result may occur with  improper specimen collection/handling, submission of specimen other than nasopharyngeal swab, presence of viral mutation(s) within the areas targeted by this assay, and inadequate number of viral copies(<138 copies/mL). A negative result must be combined with clinical observations, patient history, and epidemiological information. The expected result is Negative.  Fact Sheet for Patients:  EntrepreneurPulse.com.au  Fact Sheet for Healthcare Providers:  IncredibleEmployment.be  This test is no t yet approved or cleared by the [place] FDA and  has been authorized for detection and/or diagnosis of SARS-CoV-2 by FDA under an Emergency Use Authorization (EUA). This EUA will remain  in effect (meaning this test can be used) for the duration of the COVID-19 declaration under Section 564(b)(1) of the Act, 21 U.S.C.section 360bbb-3(b)(1), unless the authorization is terminated  or revoked sooner.       Influenza A by PCR NEGATIVE NEGATIVE Final   Influenza B by PCR NEGATIVE NEGATIVE Final    Comment: (NOTE) The Xpert Xpress SARS-CoV-2/FLU/RSV plus assay is intended as an aid in the diagnosis of influenza from Nasopharyngeal swab specimens and should not be used as a sole basis for treatment. Nasal washings and aspirates are unacceptable for Xpert Xpress SARS-CoV-2/FLU/RSV testing.  Fact Sheet for Patients: EntrepreneurPulse.com.au  Fact Sheet for Healthcare  Providers: IncredibleEmployment.be  This test is not yet approved or cleared by the [place] FDA and has been authorized for detection and/or diagnosis of SARS-CoV-2 by FDA under an Emergency Use Authorization (EUA). This EUA will remain in effect (meaning this test can be used) for the duration of the COVID-19 declaration under Section 564(b)(1) of the Act, 21 U.S.C. section 360bbb-3(b)(1), unless the authorization is terminated or revoked.  Performed at [facility], [place] [address], [place],  [address]          Radiology Studies: MR Brain W and Wo Contrast  Result Date: 12/22/2020 CLINICAL DATA:  Dizziness, nonspecific; history of cancer, new seizure, dizziness. EXAM: MRI HEAD WITHOUT AND WITH CONTRAST TECHNIQUE: Multiplanar, multiecho pulse sequences of the brain and surrounding structures were obtained without and with intravenous contrast. CONTRAST:  4.23mL GADAVIST GADOBUTROL 1 MMOL/ML IV SOLN COMPARISON:  Brain MRI 03/22/2011. FINDINGS: Brain: Mild generalized cerebral and cerebellar atrophy. Peripherally enhancing centrally cystic/necrotic lesion within the cortical/subcortical posterior right frontal lobe, measuring 2.5 x 2.2 x 2.2 cm (for instance as seen on series 20, image 110) (series 23, image 16). There is mild surrounding edema. There is also minimal SWI signal loss associated with this lesion which may reflect mineralization, nonacute blood products or prominent vessels. Additional 1.2 cm enhancing lesion within the left parietal lobe (for instance as seen on series 23, image 10). Mild edema surrounds this lesion as well. Background mild multifocal T2 FLAIR hyperintense signal abnormality within the cerebral white matter, nonspecific but compatible with chronic small vessel ischemic disease. There is no acute infarct. No extra-axial fluid collection. No midline shift. Vascular: Maintained flow voids within the proximal large  arterial vessels. Skull and upper cervical spine: No focal suspicious marrow lesion. Susceptibility artifact arising from cervical spinal fusion hardware. Sinuses/Orbits: Visualized orbits show no acute finding. Trace bilateral ethmoid sinus mucosal thickening. IMPRESSION: Two enhancing parenchymal lesions, likely reflecting intracranial metastatic disease, one measuring 2.5 x 2.2 cm within the posterior right frontal lobe, and the other measuring 1.2 cm within the left parietal lobe. Mild edema surrounding both lesions. Mild chronic small vessel ischemic changes within the cerebral white matter. Mild generalized parenchymal atrophy. Electronically Signed   By: Kellie Simmering D.O.   On: 12/22/2020 15:16   CT CHEST ABDOMEN PELVIS W CONTRAST  Result Date: 12/22/2020 CLINICAL DATA:  History of primary peritoneal carcinoma diagnosed September 2020 status post chemotherapy, TAHBSO, radical tumor debulking and omentectomy 02/17/2019. Additional history of breast cancer. Patient presents with new headache with 2 new brain masses. Restaging. EXAM: CT CHEST, ABDOMEN, AND PELVIS WITH CONTRAST TECHNIQUE: Multidetector CT imaging of the chest, abdomen and pelvis was performed following the standard protocol during bolus administration of intravenous contrast. CONTRAST:  48mL OMNIPAQUE IOHEXOL 350 MG/ML SOLN COMPARISON:  06/16/2019 CT chest, abdomen and pelvis. FINDINGS: CT CHEST FINDINGS Cardiovascular: Normal heart size. No significant pericardial effusion/thickening. Right internal jugular Port-A-Cath terminates at the cavoatrial junction. Atherosclerotic nonaneurysmal thoracic aorta. Normal caliber pulmonary arteries. No central  pulmonary emboli. Mediastinum/Nodes: No discrete thyroid nodules. Unremarkable esophagus. No axillary adenopathy. Newly enlarged 1.1 cm high anterior mediastinal midline node (series 2/image 16). No hilar adenopathy. Lungs/Pleura: No pneumothorax. No pleural effusion. Right middle lobe masslike  focus of consolidation measuring 3.6 x 2.3 cm (series 4/image 84), previously 3.5 x 2.1 cm on 06/16/2019 CT and 3.6 x 2.2 cm on 01/31/2019 CT using similar measurement technique, not substantially changed. No acute consolidative airspace disease or new significant pulmonary nodules. Musculoskeletal:  No aggressive appearing focal osseous lesions. CT ABDOMEN PELVIS FINDINGS Hepatobiliary: Normal liver with no liver mass. Normal gallbladder with no radiopaque cholelithiasis. No biliary ductal dilatation. Pancreas: Normal, with no mass or duct dilation. Spleen: Normal size. No mass. Adrenals/Urinary Tract: Normal adrenals. Normal kidneys with no hydronephrosis and no renal mass. Normal bladder. Stomach/Bowel: Normal non-distended stomach. Normal caliber small bowel with no small bowel wall thickening. Normal appendix. Normal large bowel with no diverticulosis, large bowel wall thickening or pericolonic fat stranding. Vascular/Lymphatic: Atherosclerotic nonaneurysmal abdominal aorta. Patent portal, splenic, hepatic and renal veins. Enlarged high left retroperitoneal para-aortic additional 2.3 x 1.5 cm node (series 2/image 66), new. No pathologically enlarged lymph nodes in the abdomen or pelvis. Reproductive: Status post hysterectomy, with no abnormal findings at the vaginal cuff. No adnexal mass. Other: No pneumoperitoneum, ascites or focal fluid collection. Musculoskeletal: No aggressive appearing focal osseous lesions. IMPRESSION: 1. New high anterior mediastinal and high left retroperitoneal lymphadenopathy, suspicious for metastatic disease. 2. Chronic right middle lobe masslike focus of consolidation, unchanged back to 01/31/2019 chest CT, indeterminate although likely postinfectious/postinflammatory scarring. 3. Aortic Atherosclerosis (ICD10-I70.0). Electronically Signed   By: Ilona Sorrel M.D.   On: 12/22/2020 19:16        Scheduled Meds:  Azelaic Acid  1 application Topical Daily   Chlorhexidine  Gluconate Cloth  6 each Topical Daily   gabapentin  600 mg Oral q AM   And   gabapentin  300 mg Oral 2 times per day   levETIRAcetam  500 mg Oral BID   Continuous Infusions:        Aline August, MD [facility] 12/23/2020, 10:48 AM

## 2020-12-24 ENCOUNTER — Other Ambulatory Visit: Payer: Self-pay | Admitting: Radiation Therapy

## 2020-12-24 DIAGNOSIS — C482 Malignant neoplasm of peritoneum, unspecified: Secondary | ICD-10-CM | POA: Diagnosis not present

## 2020-12-24 DIAGNOSIS — R42 Dizziness and giddiness: Secondary | ICD-10-CM | POA: Diagnosis not present

## 2020-12-24 DIAGNOSIS — C7931 Secondary malignant neoplasm of brain: Secondary | ICD-10-CM

## 2020-12-24 DIAGNOSIS — R569 Unspecified convulsions: Secondary | ICD-10-CM | POA: Diagnosis not present

## 2020-12-24 DIAGNOSIS — Z86000 Personal history of in-situ neoplasm of breast: Secondary | ICD-10-CM | POA: Diagnosis not present

## 2020-12-24 DIAGNOSIS — G9389 Other specified disorders of brain: Secondary | ICD-10-CM | POA: Diagnosis not present

## 2020-12-24 LAB — CA 125: Cancer Antigen (CA) 125: 10 U/mL (ref 0.0–38.1)

## 2020-12-24 MED ORDER — LEVETIRACETAM 500 MG PO TABS: 500.0000 mg | ORAL_TABLET | Freq: Two times a day (BID) | ORAL | 0 refills | Status: DC

## 2020-12-24 MED ORDER — HEPARIN SOD (PORK) LOCK FLUSH 100 UNIT/ML IV SOLN
500.0000 [IU] | INTRAVENOUS | Status: AC | PRN
  Administered 2020-12-24: 500 [IU]
  Filled 2020-12-24: qty 5

## 2020-12-24 NOTE — Discharge Summary (Signed)
Physician Discharge Summary  [name] [name] ZOX:[id number] DOB: 12/24/1950 DOA: 12/22/2020  PCP: Orpah Melter, MD  Admit date: 12/22/2020 Discharge date: 12/24/2020  Admitted From: Home Disposition: Home  Recommendations for Outpatient Follow-up:  Follow up with PCP in 1 week  Outpatient follow-up with oncology/Dr. Alvy Bimler at earliest convenience Keep scheduled appointment with neurology Follow up in ED if symptoms worsen or new appear As per neurology:       Standard seizure precautions to review with patient: Per [facility] [place] statutes, patients with seizures are not allowed to drive until  they have been seizure-free for six months. Use caution when using heavy equipment or power tools. Avoid working on ladders or at heights. Take showers instead of baths. Ensure the water temperature is not too high on the home water heater. Do not go swimming alone. When caring for infants or small children, sit down when holding, feeding, or changing them to minimize risk of injury to the child in the event you have a seizure.  To reduce risk of seizures, maintain good sleep hygiene avoid alcohol and illicit drug use, take all anti-seizure medications as prescribed.      Home Health: No Equipment/Devices: None  Discharge Condition: Guarded CODE STATUS: Full Diet recommendation: Heart healthy  Brief/Interim Summary: 70 y.o. female with medical history significant of peritoneal cancer status postchemotherapy currently in remission, PAT, PACs/PVCs, LBBB being followed by cardiology/Dr. Radford Pax, anemia of chronic disease, ductal carcinoma in situ of breast, peripheral neuropathy, fibromyalgia presented with dizziness and seizures.  On presentation, MRI of brain showed 2 new brain masses with concern for metastatic disease.  ED provider spoke to on-call neurologist who recommended to start [place] and to obtain CT of the chest/abdomen/pelvis for evaluation of cancer along with neurosurgery  consultation for possible biopsy as well as oncology consultation.  No seizures since admission.  Currently tolerating oral Keppra.  Oncology recommended outpatient follow-up.  Discharge home today with outpatient follow-up with PCP/neurology/oncology.  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 Bhagat/neurology on phone on 12/22/2020 who has reviewed the chart and imaging and has recommended Keppra 500 mg twice a day.  Patient has already been started on the same.  Outpatient follow-up with neurology.  No need for any steroids at this time as per neurology. -EEG can be done as an outpatient if needed upon follow-up with neurology -No seizures since admission.   -Discharge home on Keppra 500 mg twice a day.     New diagnosis of metastatic brain tumors -MRI of the brain showed 2 enhancing parenchymal lesions with mild edema surrounding both lesions. -I communicated with Dr. Vaslow/neurooncology who recommended to get opinion of neurosurgery.   -I communicated with Dawley/neurosurgery via secure chat who was in favor of radiation of these lesions unless her seizures become uncontrolled.   -Oncology has evaluated the patient and recommended outpatient follow-up at earliest convenience to discuss regarding options   History of peritoneal cancer status postchemotherapy currently in remission History of ductal carcinoma in situ of breast -Follows up with Dr. Alvy Bimler as an outpatient.  Currently in remission. -CT of the chest/abdomen/pelvis showed mediastinal lymphadenopathy and retroperitoneal lymphadenopathy.  Outpatient follow-up with Dr. Alvy Bimler at earliest convenience.  Discharge Instructions  Discharge Instructions     Diet - low sodium heart healthy   Complete by: As directed    Increase activity slowly   Complete by: As directed       Allergies as of 12/24/2020  Reactions   Fentanyl Palpitations   Palpitations , fever  ,headahe, N/V  " loud pounding heart beat"    Lac Bovis Diarrhea, Nausea And Vomiting   Brimonidine Tartrate    Other reaction(s): blurred vision   Ditropan [oxybutynin]    Milk-related Compounds Diarrhea, Nausea And Vomiting   Other Other (See Comments)   Oxybutynin Chloride Other (See Comments)   Ditropan  Pt doesn't remember   Thimerosal Other (See Comments)   Pt doesn't remember   Tramadol Other (See Comments)   Pt doesn't remember   Avelox [moxifloxacin Hcl In Nacl] Rash   Doxycycline Rash   Erythromycin Rash   Metronidazole Rash   Minocycline Rash   Penicillins Rash   Did it involve swelling of the face/tongue/throat, SOB, or low BP? No Did it involve sudden or severe rash/hives, skin peeling, or any reaction on the inside of your mouth or nose? No Did you need to seek medical attention at a hospital or doctor's office? No When did it last happen?      Childhood allergy If all above answers are "NO", may proceed with cephalosporin use.   Quinolones Rash   Sulfonamide Derivatives Rash        Medication List     TAKE these medications    Azelaic Acid 15 % gel Apply 1 application topically daily.   calcium carbonate 1250 (500 Ca) MG tablet Commonly known as: OS-CAL - dosed in mg of elemental calcium Take 1 tablet by mouth.   cholecalciferol 25 MCG (1000 UNIT) tablet Commonly known as: VITAMIN D3 Take 2,000 Units by mouth daily.   cromolyn 5.2 MG/ACT nasal spray Commonly known as: NASALCROM Place 1 spray into the nose as needed for allergies.   gabapentin 300 MG capsule Commonly known as: NEURONTIN Take 300 mg by mouth. Taking 600 mg in am ;300 mg at lunch; 300 mg at hs.   levETIRAcetam 500 MG tablet Commonly known as: KEPPRA Take 1 tablet (500 mg total) by mouth 2 (two) times daily.   lidocaine-prilocaine cream Commonly known as: EMLA APPLY TOPICALLY AS NEEDED (PORT ACCESS).   Magnesium 250 MG Tabs Take 250 mg by mouth daily.   PROBIOTIC DAILY  PO Take 1 capsule by mouth daily.   risedronate 35 MG tablet Commonly known as: ACTONEL Take 35 mg by mouth every 7 (seven) days. with water on empty stomach, nothing by mouth or lie down for next 30 minutes.   Sulfacetamide Sodium (Acne) 10 % Lotn Apply 1 application topically at bedtime.   VITAMIN E PO Take 50 Int'l Units/day by mouth daily.        Follow-up Information     Orpah Melter, MD. Schedule an appointment as soon as possible for a visit in 1 week(s).   Specialty: Family Medicine Contact information: [place] [address] [phone]         Sueanne Margarita, MD .   Specialty: Cardiology Contact information: [phone] [place] [address] [phone]         [name], MD Follow up.   Specialty: Hematology and Oncology Why: At earliest convenience Contact information: [place] [address] [phone]         Neurologist Follow up.   Why: Keep scheduled appointment               Allergies  Allergen Reactions   Fentanyl Palpitations    Palpitations , fever ,headahe, N/V  "  loud pounding heart beat"    Lac Bovis Diarrhea and Nausea And Vomiting   Brimonidine Tartrate     Other reaction(s): blurred vision   Ditropan [Oxybutynin]    Milk-Related Compounds Diarrhea and Nausea And Vomiting   Other Other (See Comments)   Oxybutynin Chloride Other (See Comments)    Ditropan  Pt doesn't remember    Thimerosal Other (See Comments)    Pt doesn't remember   Tramadol Other (See Comments)    Pt doesn't remember   Avelox [Moxifloxacin Hcl In Nacl] Rash   Doxycycline Rash   Erythromycin Rash   Metronidazole Rash   Minocycline Rash   Penicillins Rash    Did it involve swelling of the face/tongue/throat, SOB, or low BP? No Did it involve sudden or severe rash/hives, skin peeling, or any reaction on the inside of your mouth or nose? No Did you need to seek  medical attention at a hospital or doctor's office? No When did it last happen?      Childhood allergy If all above answers are "NO", may proceed with cephalosporin use.    Quinolones Rash   Sulfonamide Derivatives Rash    Consultations: Neurology/neurosurgery/oncology   Procedures/Studies: MR Brain W and Wo Contrast  Result Date: 12/22/2020 CLINICAL DATA:  Dizziness, nonspecific; history of cancer, new seizure, dizziness. EXAM: MRI HEAD WITHOUT AND WITH CONTRAST TECHNIQUE: Multiplanar, multiecho pulse sequences of the brain and surrounding structures were obtained without and with intravenous contrast. CONTRAST:  4.65mL GADAVIST GADOBUTROL 1 MMOL/ML IV SOLN COMPARISON:  Brain MRI 03/22/2011. FINDINGS: Brain: Mild generalized cerebral and cerebellar atrophy. Peripherally enhancing centrally cystic/necrotic lesion within the cortical/subcortical posterior right frontal lobe, measuring 2.5 x 2.2 x 2.2 cm (for instance as seen on series 20, image 110) (series 23, image 16). There is mild surrounding edema. There is also minimal SWI signal loss associated with this lesion which may reflect mineralization, nonacute blood products or prominent vessels. Additional 1.2 cm enhancing lesion within the left parietal lobe (for instance as seen on series 23, image 10). Mild edema surrounds this lesion as well. Background mild multifocal T2 FLAIR hyperintense signal abnormality within the cerebral white matter, nonspecific but compatible with chronic small vessel ischemic disease. There is no acute infarct. No extra-axial fluid collection. No midline shift. Vascular: Maintained flow voids within the proximal large arterial vessels. Skull and upper cervical spine: No focal suspicious marrow lesion. Susceptibility artifact arising from cervical spinal fusion hardware. Sinuses/Orbits: Visualized orbits show no acute finding. Trace bilateral ethmoid sinus mucosal thickening. IMPRESSION: Two enhancing parenchymal  lesions, likely reflecting intracranial metastatic disease, one measuring 2.5 x 2.2 cm within the posterior right frontal lobe, and the other measuring 1.2 cm within the left parietal lobe. Mild edema surrounding both lesions. Mild chronic small vessel ischemic changes within the cerebral white matter. Mild generalized parenchymal atrophy. Electronically Signed   By: Kellie Simmering D.O.   On: 12/22/2020 15:16   CT CHEST ABDOMEN PELVIS W CONTRAST  Result Date: 12/22/2020 CLINICAL DATA:  History of primary peritoneal carcinoma diagnosed September 2020 status post chemotherapy, TAHBSO, radical tumor debulking and omentectomy 02/17/2019. Additional history of breast cancer. Patient presents with new headache with 2 new brain masses. Restaging. EXAM: CT CHEST, ABDOMEN, AND PELVIS WITH CONTRAST TECHNIQUE: Multidetector CT imaging of the chest, abdomen and pelvis was performed following the standard protocol during bolus administration of intravenous contrast. CONTRAST:  53mL OMNIPAQUE IOHEXOL 350 MG/ML SOLN COMPARISON:  06/16/2019 CT chest, abdomen and pelvis. FINDINGS: CT CHEST FINDINGS  Cardiovascular: Normal heart size. No significant pericardial effusion/thickening. Right internal jugular Port-A-Cath terminates at the cavoatrial junction. Atherosclerotic nonaneurysmal thoracic aorta. Normal caliber pulmonary arteries. No central pulmonary emboli. Mediastinum/Nodes: No discrete thyroid nodules. Unremarkable esophagus. No axillary adenopathy. Newly enlarged 1.1 cm high anterior mediastinal midline node (series 2/image 16). No hilar adenopathy. Lungs/Pleura: No pneumothorax. No pleural effusion. Right middle lobe masslike focus of consolidation measuring 3.6 x 2.3 cm (series 4/image 84), previously 3.5 x 2.1 cm on 06/16/2019 CT and 3.6 x 2.2 cm on 01/31/2019 CT using similar measurement technique, not substantially changed. No acute consolidative airspace disease or new significant pulmonary nodules. Musculoskeletal:   No aggressive appearing focal osseous lesions. CT ABDOMEN PELVIS FINDINGS Hepatobiliary: Normal liver with no liver mass. Normal gallbladder with no radiopaque cholelithiasis. No biliary ductal dilatation. Pancreas: Normal, with no mass or duct dilation. Spleen: Normal size. No mass. Adrenals/Urinary Tract: Normal adrenals. Normal kidneys with no hydronephrosis and no renal mass. Normal bladder. Stomach/Bowel: Normal non-distended stomach. Normal caliber small bowel with no small bowel wall thickening. Normal appendix. Normal large bowel with no diverticulosis, large bowel wall thickening or pericolonic fat stranding. Vascular/Lymphatic: Atherosclerotic nonaneurysmal abdominal aorta. Patent portal, splenic, hepatic and renal veins. Enlarged high left retroperitoneal para-aortic additional 2.3 x 1.5 cm node (series 2/image 66), new. No pathologically enlarged lymph nodes in the abdomen or pelvis. Reproductive: Status post hysterectomy, with no abnormal findings at the vaginal cuff. No adnexal mass. Other: No pneumoperitoneum, ascites or focal fluid collection. Musculoskeletal: No aggressive appearing focal osseous lesions. IMPRESSION: 1. New high anterior mediastinal and high left retroperitoneal lymphadenopathy, suspicious for metastatic disease. 2. Chronic right middle lobe masslike focus of consolidation, unchanged back to 01/31/2019 chest CT, indeterminate although likely postinfectious/postinflammatory scarring. 3. Aortic Atherosclerosis (ICD10-I70.0). Electronically Signed   By: Ilona Sorrel M.D.   On: 12/22/2020 19:16      Subjective: Patient seen and examined at bedside.  Denies any headache, nausea, vomiting or any more seizures.  Discharge Exam: Vitals:   12/23/20 2029 12/24/20 0456  BP: (!) 106/51 101/60  Pulse: 72 73  Resp: 18 16  Temp: 97.8 F (36.6 C) 98 F (36.7 C)  SpO2: 97% 99%    General: Pt is alert, awake, not in acute distress.  Currently on room air Cardiovascular: rate  controlled, S1/S2 + Respiratory: bilateral decreased breath sounds at bases Abdominal: Soft, NT, ND, bowel sounds + Extremities: no edema, no cyanosis    The results of significant diagnostics from this hospitalization (including imaging, microbiology, ancillary and laboratory) are listed below for reference.     Microbiology: Recent Results (from the past 240 hour(s))  Resp Panel by RT-PCR (Flu A&B, Covid) Nasopharyngeal Swab     Status: None   Collection Time: 12/22/20  4:25 PM   Specimen: Nasopharyngeal Swab; Nasopharyngeal(NP) swabs in vial transport medium  Result Value Ref Range Status   SARS Coronavirus 2 by RT PCR NEGATIVE NEGATIVE Final    Comment: (NOTE) SARS-CoV-2 target nucleic acids are NOT DETECTED.  The SARS-CoV-2 RNA is generally detectable in upper respiratory specimens during the acute phase of infection. The lowest concentration of SARS-CoV-2 viral copies this assay can detect is 138 copies/mL. A negative result does not preclude SARS-Cov-2 infection and should not be used as the sole basis for treatment or other patient management decisions. A negative result may occur with  improper specimen collection/handling, submission of specimen other than nasopharyngeal swab, presence of viral mutation(s) within the areas targeted by this assay, and inadequate  number of viral copies(<138 copies/mL). A negative result must be combined with clinical observations, patient history, and epidemiological information. The expected result is Negative.  Fact Sheet for Patients:  EntrepreneurPulse.com.au  Fact Sheet for Healthcare Providers:  IncredibleEmployment.be  This test is no t yet approved or cleared by the [place] FDA and  has been authorized for detection and/or diagnosis of SARS-CoV-2 by FDA under an Emergency Use Authorization (EUA). This EUA will remain  in effect (meaning this test can be used) for the duration of  the COVID-19 declaration under Section 564(b)(1) of the Act, 21 U.S.C.section 360bbb-3(b)(1), unless the authorization is terminated  or revoked sooner.       Influenza A by PCR NEGATIVE NEGATIVE Final   Influenza B by PCR NEGATIVE NEGATIVE Final    Comment: (NOTE) The Xpert Xpress SARS-CoV-2/FLU/RSV plus assay is intended as an aid in the diagnosis of influenza from Nasopharyngeal swab specimens and should not be used as a sole basis for treatment. Nasal washings and aspirates are unacceptable for Xpert Xpress SARS-CoV-2/FLU/RSV testing.  Fact Sheet for Patients: EntrepreneurPulse.com.au  Fact Sheet for Healthcare Providers: IncredibleEmployment.be  This test is not yet approved or cleared by the [place] FDA and has been authorized for detection and/or diagnosis of SARS-CoV-2 by FDA under an Emergency Use Authorization (EUA). This EUA will remain in effect (meaning this test can be used) for the duration of the COVID-19 declaration under Section 564(b)(1) of the Act, 21 U.S.C. section 360bbb-3(b)(1), unless the authorization is terminated or revoked.  Performed at [facility], [place] [address] [place], [place] [address]      Labs: BNP (last 3 results) No results for input(s): BNP in the last 8760 hours. Basic Metabolic Panel: Recent Labs  Lab 12/22/20 1322 12/23/20 0522  NA 137 139  K 3.9 4.2  CL 99 103  CO2 29 28  GLUCOSE 92 93  BUN 13 13  CREATININE 0.55 0.52  CALCIUM 9.7 9.7  MG  --  2.0   Liver Function Tests: Recent Labs  Lab 12/22/20 1322 12/23/20 0522  AST 25 23  ALT 15 18  ALKPHOS 63 58  BILITOT 0.8 0.6  PROT 7.9 7.6  ALBUMIN 4.5 4.1   No results for input(s): LIPASE, AMYLASE in the last 168 hours. No results for input(s): AMMONIA in the last 168 hours. CBC: Recent Labs  Lab 12/22/20 1322 12/23/20 0522  WBC 9.0 5.9  NEUTROABS 6.7  --   HGB 12.4 12.7  HCT 37.8 38.1  MCV 92.0  91.6  PLT 234 201   Cardiac Enzymes: No results for input(s): CKTOTAL, CKMB, CKMBINDEX, TROPONINI in the last 168 hours. BNP: Invalid input(s): POCBNP CBG: No results for input(s): GLUCAP in the last 168 hours. D-Dimer No results for input(s): DDIMER in the last 72 hours. Hgb A1c No results for input(s): HGBA1C in the last 72 hours. Lipid Profile No results for input(s): CHOL, HDL, LDLCALC, TRIG, CHOLHDL, LDLDIRECT in the last 72 hours. Thyroid function studies No results for input(s): TSH, T4TOTAL, T3FREE, THYROIDAB in the last 72 hours.  Invalid input(s): FREET3 Anemia work up No results for input(s): VITAMINB12, FOLATE, FERRITIN, TIBC, IRON, RETICCTPCT in the last 72 hours. Urinalysis    Component Value Date/Time   COLORURINE YELLOW 03/23/2019 1300   APPEARANCEUR CLEAR 03/23/2019 1300   LABSPEC 1.014 03/23/2019 1300   PHURINE 6.0 03/23/2019 1300   GLUCOSEU NEGATIVE 03/23/2019 1300   HGBUR MODERATE (A) 03/23/2019 1300   BILIRUBINUR NEGATIVE 03/23/2019  [place] 03/23/2019 1300   PROTEINUR NEGATIVE 03/23/2019 1300   UROBILINOGEN 0.2 08/15/2011 1416   NITRITE NEGATIVE 03/23/2019 1300   LEUKOCYTESUR NEGATIVE 03/23/2019 1300   Sepsis Labs Invalid input(s): PROCALCITONIN,  WBC,  LACTICIDVEN Microbiology Recent Results (from the past 240 hour(s))  Resp Panel by RT-PCR (Flu A&B, Covid) Nasopharyngeal Swab     Status: None   Collection Time: 12/22/20  4:25 PM   Specimen: Nasopharyngeal Swab; Nasopharyngeal(NP) swabs in vial transport medium  Result Value Ref Range Status   SARS Coronavirus 2 by RT PCR NEGATIVE NEGATIVE Final    Comment: (NOTE) SARS-CoV-2 target nucleic acids are NOT DETECTED.  The SARS-CoV-2 RNA is generally detectable in upper respiratory specimens during the acute phase of infection. The lowest concentration of SARS-CoV-2 viral copies this assay can detect is 138 copies/mL. A negative result does not preclude SARS-Cov-2 infection and  should not be used as the sole basis for treatment or other patient management decisions. A negative result may occur with  improper specimen collection/handling, submission of specimen other than nasopharyngeal swab, presence of viral mutation(s) within the areas targeted by this assay, and inadequate number of viral copies(<138 copies/mL). A negative result must be combined with clinical observations, patient history, and epidemiological information. The expected result is Negative.  Fact Sheet for Patients:  EntrepreneurPulse.com.au  Fact Sheet for Healthcare Providers:  IncredibleEmployment.be  This test is no t yet approved or cleared by the [place] FDA and  has been authorized for detection and/or diagnosis of SARS-CoV-2 by FDA under an Emergency Use Authorization (EUA). This EUA will remain  in effect (meaning this test can be used) for the duration of the COVID-19 declaration under Section 564(b)(1) of the Act, 21 U.S.C.section 360bbb-3(b)(1), unless the authorization is terminated  or revoked sooner.       Influenza A by PCR NEGATIVE NEGATIVE Final   Influenza B by PCR NEGATIVE NEGATIVE Final    Comment: (NOTE) The Xpert Xpress SARS-CoV-2/FLU/RSV plus assay is intended as an aid in the diagnosis of influenza from Nasopharyngeal swab specimens and should not be used as a sole basis for treatment. Nasal washings and aspirates are unacceptable for Xpert Xpress SARS-CoV-2/FLU/RSV testing.  Fact Sheet for Patients: EntrepreneurPulse.com.au  Fact Sheet for Healthcare Providers: IncredibleEmployment.be  This test is not yet approved or cleared by the [place] FDA and has been authorized for detection and/or diagnosis of SARS-CoV-2 by FDA under an Emergency Use Authorization (EUA). This EUA will remain in effect (meaning this test can be used) for the duration of the COVID-19 declaration  under Section 564(b)(1) of the Act, 21 U.S.C. section 360bbb-3(b)(1), unless the authorization is terminated or revoked.  Performed at [facility], [place] [address] [place], [place] [address]      Time coordinating discharge: 35 minutes  SIGNED:   Aline August, MD  [facility] 12/24/2020, 10:31 AM

## 2020-12-24 NOTE — Progress Notes (Signed)
Brief Oncology Note:  We were notified by the hospitalist that the patient is stable for discharge.  Her chart has been reviewed.  We also discussed her case with neuro-oncology and radiation oncology.  We will plan for outpatient follow-up with Dr. Alvy Bimler on 12/25/2020 at 3 PM to discuss all scan results and make plans on how to move forward.  Radiation oncology is in the process of arranging for an MRI of the brain with SRS protocol.  Thereafter, they will get her set up for [facility].  A CA125 was drawn on 12/23/2020.  These results are still pending.  We will follow-up on these results as they become available.  Okay to discharge the patient from our standpoint.  Mikey Bussing, DNP, AGPCNP-BC, AOCNP

## 2020-12-25 ENCOUNTER — Other Ambulatory Visit: Payer: Self-pay | Admitting: Radiation Therapy

## 2020-12-25 ENCOUNTER — Encounter: Payer: Self-pay | Admitting: Hematology and Oncology

## 2020-12-25 ENCOUNTER — Inpatient Hospital Stay (HOSPITAL_BASED_OUTPATIENT_CLINIC_OR_DEPARTMENT_OTHER): Payer: Medicare Other | Admitting: Internal Medicine

## 2020-12-25 ENCOUNTER — Inpatient Hospital Stay: Payer: Medicare Other | Attending: Gynecologic Oncology | Admitting: Hematology and Oncology

## 2020-12-25 ENCOUNTER — Other Ambulatory Visit: Payer: Self-pay

## 2020-12-25 ENCOUNTER — Telehealth: Payer: Self-pay

## 2020-12-25 DIAGNOSIS — C7931 Secondary malignant neoplasm of brain: Secondary | ICD-10-CM | POA: Insufficient documentation

## 2020-12-25 DIAGNOSIS — C7962 Secondary malignant neoplasm of left ovary: Secondary | ICD-10-CM | POA: Insufficient documentation

## 2020-12-25 DIAGNOSIS — C482 Malignant neoplasm of peritoneum, unspecified: Secondary | ICD-10-CM | POA: Diagnosis not present

## 2020-12-25 MED ORDER — DEXAMETHASONE 4 MG PO TABS: 4.0000 mg | ORAL_TABLET | Freq: Every day | ORAL | 1 refills | Status: DC

## 2020-12-25 NOTE — Assessment & Plan Note (Signed)
Her MRI of the brain show abnormal lesions, and she has focal neurological deficits with imbalance, recent seizures and others She will be seen by Dr. Mickeal Skinner for evaluation and management I will see her back after definitive plan is made for brain lesions

## 2020-12-25 NOTE — Assessment & Plan Note (Signed)
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

## 2020-12-25 NOTE — Telephone Encounter (Signed)
Called and canceled referral. Appt no longer needed.

## 2020-12-25 NOTE — Progress Notes (Signed)
[facility] [place], [place] [address] [phone]   New Patient Evaluation  Date of Service: 12/25/20 Patient Name: [name] [name] Patient MRN: [id number] Patient DOB: 1950-08-05 Provider: Ventura Sellers, MD  Identifying Statement:  [name] [name] is a 70 y.o. female with Brain metastases [facility]) who presents for initial consultation and evaluation regarding cancer associated neurologic deficits.    Referring Provider: [name], MD [address] 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 Findings highly suspicious for peritoneal carcinomatosis. Recommend paracentesis for therapeutic and diagnostic purposes. I do not see an obvious primary lesion but there is some irregular enhancing soft tissue in the right adnexal area. CA 125 level may be helpful. 2. Mild surface irregularity involving the liver but I do not see any obvious changes of cirrhosis. No hepatic lesions.   11/23/2018 Tumor Marker   Patient's tumor was tested for the following markers: CA-125 Results of the tumor marker test revealed 253.   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   12/01/2018 Cancer Staging   Staging form: Ovary, Fallopian Tube, and Primary Peritoneal Carcinoma, AJCC 8th Edition - Clinical stage from 12/01/2018: cT3, cN0, cM0 - Signed by [name], MD on 12/01/2018   12/02/2018 Tumor Marker   Patient's tumor was tested for the following markers: CA-125 Results of the tumor marker test revealed 267   12/06/2018 - 05/16/2019 Chemotherapy   The patient had carboplatin and taxol for chemotherapy treatment.     12/08/2018 Procedure    Successful ultrasound-guided therapeutic paracentesis yielding 1.7 liters of peritoneal fluid.   12/24/2018 Procedure   Successful placement of a right IJ approach Power Port with ultrasound and fluoroscopic guidance. The catheter is ready for use   01/17/2019 Tumor Marker   Patient's tumor was tested for the following markers: CA-125 Results of the tumor marker test revealed 57.2   01/31/2019 Imaging   Significant decrease in peritoneal carcinomatosis since previous study. Interval resolution of ascites.   Focal area of parenchymal consolidation in central right middle lobe, which measures 3 cm. Differential diagnosis includes infectious or inflammatory process, atelectasis, and neoplasm. Recommend short-term follow-up by chest CT in 2-3 months.     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   Surgeon: Donaciano Eva    Operative Findings:  : grossly normal uterus, ovaries normal, few scattered peritoneal nodules (77m) on serosa of uterus and tubes. The omentum (gastrocolic) was tethered to the mesentery with tumor (thin rind). Complete (optimal) resection of tumor with no gross residual disease.     02/17/2019 Pathology Results   FINAL MICROSCOPIC DIAGNOSIS: A. UTERUS, CERVIX, BILATERAL FALLOPIAN TUBES AND OVARIES, HYSTERECTOMY WITH SALPINGOOOPHORECTOMY: - Uterus: Endometrium: Inactive endometrium. No hyperplasia or malignancy. Myometrium: Unremarkable. No malignancy. Serosa: Metastatic carcinoma. No malignancy. - Cervix: Benign squamous and endocervical mucosa. No dysplasia or malignancy. - Left ovary and fallopian tube: Metastatic carcinoma. - Right ovary: No malignancy identified. - Right fallopian tube: Luminal tumor, see  comment. B. OMENTUM, RESECTION: - High  grade serous carcinoma. - Deposits up to at least 3 cm. - See oncology table. ONCOLOGY TABLE: OVARY or FALLOPIAN TUBE or PRIMARY PERITONEUM: Procedure: Hysterectomy with bilateral salpingo-oophorectomy and omentectomy. Specimen Integrity: Intact Tumor Site: Peritoneum Ovarian Surface Involvement (required only if applicable): Left ovary Fallopian Tube Surface Involvement (required only if applicable): Left Fallopian tube Tumor Size: Largest deposit 3 cm Histologic Type: High-grade serous carcinoma Histologic Grade: High-grade Implants (required for advanced stage serous/seromucinous borderline tumors only): Uterine serosa, left fallopian tube and ovary, omentum Other Tissue/ Organ Involvement: As above Largest Extrapelvic Peritoneal Focus (required only if applicable): 3 cm Peritoneal/Ascitic Fluid: Pre neoadjuvant [id number] positive for carcinoma. Treatment Effect (required only for high-grade serous carcinomas): Probably partial treatment effect in omental tissue. Regional Lymph Nodes: No lymph nodes submitted or found Pathologic Stage Classification (pTNM, AJCC 8th Edition): ypT3c, ypNX Representative Tumor Block: B5 Comment(s): There is surface involvement of the left ovary and fallopian tube but no primary tumor. Within the right fallopian tube lumen there is detached fragments of tumor but again no precursor lesion is noted in the right fallopian tube. Thus, the tumor is presumed primary peritoneal and staged as such.   03/21/2019 Tumor Marker   Patient's tumor was tested for the following markers: CA-125 Results of the tumor marker test revealed 15.1    Genetic Testing   Negative genetic testing. No pathogenic variants identified on the [facility] CancerNext + RNAinsight panel. VUS in ATM called [id number] identified. The report date is 05/12/2019. TumorNext HRD was originally ordered but there was not enough sample to complete this testing.   The  CancerNext+RNAinsight gene panel offered by [facility] includes sequencing and rearrangement analysis for the following 36 genes: APC*, ATM*, AXIN2, BARD1, BMPR1A, BRCA1*, BRCA2*, BRIP1*, CDH1*, CDK4, CDKN2A, CHEK2*, DICER1, MLH1*, MSH2*, MSH3, MSH6*, MUTYH*, NBN, NF1*, NTHL1, PALB2*, PMS2*, PTEN*, RAD51C*, RAD51D*, RECQL, SMAD4, SMARCA4, STK11 and TP53* (sequencing and deletion/duplication); HOXB13, POLD1 and POLE (sequencing only); EPCAM and GREM1 (deletion/duplication only). DNA and RNA analyses performed for * genes.    05/16/2019 Tumor Marker   Patient's tumor was tested for the following markers: CA-125 Results of the tumor marker test revealed 10.7   06/16/2019 Imaging   1. No evidence of residual or recurrent metastatic disease in the chest, abdomen or pelvis. 2. Masslike focus of consolidation in the right middle lobe along the major and minor fissures with associated volume loss is stable since 01/31/2019. Indolent primary pulmonary neoplasm not excluded. PET-CT may be considered for further characterization. 3. Aortic Atherosclerosis (ICD10-I70.0).   06/16/2019 Tumor Marker   Patient's tumor was tested for the following markers: CA-125 Results of the tumor marker test revealed 9.1.   12/05/2019 Tumor Marker   Patient's tumor was tested for the following markers: CA-125. Results of the tumor marker test revealed 9.0.   02/29/2020 Tumor Marker   Patient's tumor was tested for the following markers: CA-`125 Results of the tumor marker test revealed 9.7.   05/28/2020 Tumor Marker   Patient's tumor was tested for the following markers: CA-125 Results of the tumor marker test revealed 9.5   08/16/2020 Tumor Marker   Patient's tumor was tested for the following markers: CA-125 Results of the tumor marker test revealed 10.9   11/29/2020 Tumor Marker   Patient's tumor was tested for the following markers: CA-125. Results of the tumor marker test revealed 10.2.     History of Present  Illness: The patient's records from the referring physician were obtained and reviewed and the patient  interviewed to confirm this HPI.  JESSLYNN KRUCK presented to the ED on 12/22/20 with twitching of her left side of her face for 1 to 2 minutes, accompanied by slurring of speech.  There was no tongue biting, bowel or bladder incontinence.  Workup demonstrated two brain lesions consistent with suspected metastases.  She was started on Keppra (no decadron) and discharged two days later with no further seizure like events.  Priro to admission she had also been complaining of dizziness, confusion, impaired memory.  Has been seeing Dr. Alvy Bimler since 2021 for primary peritoneal carcinoma.   Medications: Current Outpatient Medications on File Prior to Visit  Medication Sig Dispense Refill   Azelaic Acid 15 % cream Apply 1 application topically daily.     calcium carbonate (OS-CAL - DOSED IN MG OF ELEMENTAL CALCIUM) 1250 (500 Ca) MG tablet Take 1 tablet by mouth.     cholecalciferol (VITAMIN D3) 25 MCG (1000 UNIT) tablet Take 2,000 Units by mouth daily.     cromolyn (NASALCROM) 5.2 MG/ACT nasal spray Place 1 spray into the nose as needed for allergies.      gabapentin (NEURONTIN) 300 MG capsule Take 300 mg by mouth. Taking 600 mg in am ;300 mg at lunch; 300 mg at hs.     levETIRAcetam (KEPPRA) 500 MG tablet Take 1 tablet (500 mg total) by mouth 2 (two) times daily. 60 tablet 0   lidocaine-prilocaine (EMLA) cream APPLY TOPICALLY AS NEEDED (PORT ACCESS). (Patient not taking: Reported on 12/22/2020) 30 g 3   Magnesium 250 MG TABS Take 250 mg by mouth daily.     Probiotic Product (PROBIOTIC DAILY PO) Take 1 capsule by mouth daily.     risedronate (ACTONEL) 35 MG tablet Take 35 mg by mouth every 7 (seven) days. with water on empty stomach, nothing by mouth or lie down for next 30 minutes.     Sulfacetamide Sodium, Acne, 10 % LOTN Apply 1 application topically at bedtime.     VITAMIN E PO Take 50 Int'l  Units/day by mouth daily.     No current facility-administered medications on file prior to visit.    Allergies:  Allergies  Allergen Reactions   Fentanyl Palpitations    Palpitations , fever ,headahe, N/V  " loud pounding heart beat"    Lac Bovis Diarrhea and Nausea And Vomiting   Brimonidine Tartrate     Other reaction(s): blurred vision   Ditropan [Oxybutynin]    Milk-Related Compounds Diarrhea and Nausea And Vomiting   Other Other (See Comments)   Oxybutynin Chloride Other (See Comments)    Ditropan  Pt doesn't remember    Thimerosal Other (See Comments)    Pt doesn't remember   Tramadol Other (See Comments)    Pt doesn't remember   Avelox [Moxifloxacin Hcl In Nacl] Rash   Doxycycline Rash   Erythromycin Rash   Metronidazole Rash   Minocycline Rash   Penicillins Rash    Did it involve swelling of the face/tongue/throat, SOB, or low BP? No Did it involve sudden or severe rash/hives, skin peeling, or any reaction on the inside of your mouth or nose? No Did you need to seek medical attention at a hospital or doctor's office? No When did it last happen?      Childhood allergy If all above answers are "NO", may proceed with cephalosporin use.    Quinolones Rash   Sulfonamide Derivatives Rash   Past Medical History:  Past Medical History:  Diagnosis Date   Allergic  rhinitis    Cervical dysplasia 1980   Cervicalgia    Eczema    Rosacea,dermatitis-Dr Stinehelfer   Esophageal reflux    Family history of colon cancer    Family history of melanoma    Family history of ovarian cancer    Family history of pancreatic cancer    Family history of prostate cancer    Family history of uterine cancer    Fibromyalgia    GERD (gastroesophageal reflux disease)    Glaucoma, narrow-angle    Heart murmur    Hiatal hernia    Irritable bowel syndrome    LBBB (left bundle branch block)    Left bundle branch block    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), unspecified site    DCIS    Mass of lung    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 Arlyce Dice   Mitral valve disorders(424.0)    No antibiotics required   Osteoporosis 12/2016   T score -2.3, 2014 T score -2.5 AP spine   PAC (premature atrial contraction) 2012   ,PVC's, and nonsustained atril tachycardia w aberration by heart monitor    PVC (premature ventricular contraction)    Rosacea    Stricture and stenosis of esophagus    Past Surgical History:  Past Surgical History:  Procedure Laterality Date   BREAST LUMPECTOMY  1998   right breast with radiation   CERVICAL DISCECTOMY     C3-7 with bone graft, in three different surgeries, have titanium plate and screws   GYNECOLOGIC CRYOSURGERY  1980   IR IMAGING GUIDED PORT INSERTION  12/24/2018   NECK SURGERY  1993-1997   bone graft and fusion   REFRACTIVE SURGERY     narrow angle glucoma   RHINOPLASTY  1976   TUBAL LIGATION  1977   Social History:  Social History   Socioeconomic History   Marital status: Married    Spouse name: Antony Haste   Number of children: 0   Years of education: Not on file   Highest education level: Not on file  Occupational History   Occupation: Teacher, early years/pre: [employer]  Tobacco Use   Smoking status: Never   Smokeless tobacco: Never  Vaping Use   Vaping Use: Never used  Substance and Sexual Activity   Alcohol use: Yes    Alcohol/week: 2.0 standard drinks    Types: 2 Standard drinks or equivalent per week    Comment: Social    Drug use: No   Sexual activity: Not Currently    Birth control/protection: Post-menopausal    Comment: 1st intercourse 70 yo- More than 5 partners  Other Topics Concern   Not on file  Social History Narrative   Not on file   Social Determinants of Health   Financial Resource Strain: Not on file  Food Insecurity: Not on file  Transportation Needs: Not on file  Physical Activity: Not on file  Stress: Not on file  Social Connections: Not on file  Intimate Partner Violence: Not  on file   Family History:  Family History  Problem Relation Age of Onset   Heart disease Mother    Depression Mother    Hypertension Mother    Heart disease Father    Hypertension Father    Aneurysm Father    Uterine cancer Sister 70   Hypertension Sister    Ovarian cancer Sister 47   Melanoma Sister 34   Depression Sister    Melanoma Sister 36   Heart disease  Paternal Grandfather    Stroke Maternal Grandmother    Cancer Maternal Grandfather 18       Pancreatic   Stroke Paternal Grandmother    Cancer Paternal Uncle        multiple myeloma, prostate cancer and melanoma (separate primaries)   Colon cancer Neg Hx     Review of Systems: Constitutional: Doesn't report fevers, chills or abnormal weight loss Eyes: Doesn't report blurriness of vision Ears, nose, mouth, throat, and face: Doesn't report sore throat Respiratory: Doesn't report cough, dyspnea or wheezes Cardiovascular: Doesn't report palpitation, chest discomfort  Gastrointestinal:  Doesn't report nausea, constipation, diarrhea GU: Doesn't report incontinence Skin: Doesn't report skin rashes Neurological: Per HPI Musculoskeletal: Doesn't report joint pain Behavioral/Psych: Doesn't report anxiety  Physical Exam: Wt Readings from Last 3 Encounters:  12/25/20 99 lb 9.6 oz (45.2 kg)  12/22/20 100 lb 12.8 oz (45.7 kg)  12/20/20 102 lb 12.8 oz (46.6 kg)   Temp Readings from Last 3 Encounters:  12/25/20 99.1 F (37.3 C) (Tympanic)  12/24/20 97.6 F (36.4 C)  12/03/20 98.3 F (36.8 C) (Tympanic)   BP Readings from Last 3 Encounters:  12/25/20 (!) 143/63  12/24/20 (!) 97/54  12/20/20 128/62   Pulse Readings from Last 3 Encounters:  12/25/20 (!) 101  12/24/20 72  12/20/20 80   KPS: 80. General: Alert, cooperative, pleasant, in no acute distress Head: Normal EENT: No conjunctival injection or scleral icterus.  Lungs: Resp effort normal Cardiac: Regular rate Abdomen: Non-distended abdomen Skin: No  rashes cyanosis or petechiae. Extremities: No clubbing or edema  Neurologic Exam: Mental Status: Awake, alert, attentive to examiner. Oriented to self and environment. Language is fluent with intact comprehension.  Cranial Nerves: Visual acuity is grossly normal. Visual fields are full. Extra-ocular movements intact. No ptosis. Face is symmetric Motor: Tone and bulk are normal. Power is full in both arms and legs. Reflexes are symmetric, no pathologic reflexes present.  Sensory: Intact to light touch Gait: Normal.   Labs: I have reviewed the data as listed    Component Value Date/Time   NA 139 12/23/2020 0522   K 4.2 12/23/2020 0522   CL 103 12/23/2020 0522   CO2 28 12/23/2020 0522   GLUCOSE 93 12/23/2020 0522   BUN 13 12/23/2020 0522   CREATININE 0.52 12/23/2020 0522   CREATININE 0.64 11/29/2020 1007   CALCIUM 9.7 12/23/2020 0522   PROT 7.6 12/23/2020 0522   ALBUMIN 4.1 12/23/2020 0522   AST 23 12/23/2020 0522   AST 22 11/29/2020 1007   ALT 18 12/23/2020 0522   ALT 13 11/29/2020 1007   ALKPHOS 58 12/23/2020 0522   BILITOT 0.6 12/23/2020 0522   BILITOT 0.5 11/29/2020 1007   GFRNONAA >60 12/23/2020 0522   GFRNONAA >60 11/29/2020 1007   GFRAA >60 12/05/2019 1145   Lab Results  Component Value Date   WBC 5.9 12/23/2020   NEUTROABS 6.7 12/22/2020   HGB 12.7 12/23/2020   HCT 38.1 12/23/2020   MCV 91.6 12/23/2020   PLT 201 12/23/2020    Imaging:  MR Brain W and Wo Contrast  Result Date: 12/22/2020 CLINICAL DATA:  Dizziness, nonspecific; history of cancer, new seizure, dizziness. EXAM: MRI HEAD WITHOUT AND WITH CONTRAST TECHNIQUE: Multiplanar, multiecho pulse sequences of the brain and surrounding structures were obtained without and with intravenous contrast. CONTRAST:  4.52m GADAVIST GADOBUTROL 1 MMOL/ML IV SOLN COMPARISON:  Brain MRI 03/22/2011. FINDINGS: Brain: Mild generalized cerebral and cerebellar atrophy. Peripherally enhancing centrally cystic/necrotic lesion  within  the cortical/subcortical posterior right frontal lobe, measuring 2.5 x 2.2 x 2.2 cm (for instance as seen on series 20, image 110) (series 23, image 16). There is mild surrounding edema. There is also minimal SWI signal loss associated with this lesion which may reflect mineralization, nonacute blood products or prominent vessels. Additional 1.2 cm enhancing lesion within the left parietal lobe (for instance as seen on series 23, image 10). Mild edema surrounds this lesion as well. Background mild multifocal T2 FLAIR hyperintense signal abnormality within the cerebral white matter, nonspecific but compatible with chronic small vessel ischemic disease. There is no acute infarct. No extra-axial fluid collection. No midline shift. Vascular: Maintained flow voids within the proximal large arterial vessels. Skull and upper cervical spine: No focal suspicious marrow lesion. Susceptibility artifact arising from cervical spinal fusion hardware. Sinuses/Orbits: Visualized orbits show no acute finding. Trace bilateral ethmoid sinus mucosal thickening. IMPRESSION: Two enhancing parenchymal lesions, likely reflecting intracranial metastatic disease, one measuring 2.5 x 2.2 cm within the posterior right frontal lobe, and the other measuring 1.2 cm within the left parietal lobe. Mild edema surrounding both lesions. Mild chronic small vessel ischemic changes within the cerebral white matter. Mild generalized parenchymal atrophy. Electronically Signed   By: Kellie Simmering D.O.   On: 12/22/2020 15:16   CT CHEST ABDOMEN PELVIS W CONTRAST  Result Date: 12/22/2020 CLINICAL DATA:  History of primary peritoneal carcinoma diagnosed September 2020 status post chemotherapy, TAHBSO, radical tumor debulking and omentectomy 02/17/2019. Additional history of breast cancer. Patient presents with new headache with 2 new brain masses. Restaging. EXAM: CT CHEST, ABDOMEN, AND PELVIS WITH CONTRAST TECHNIQUE: Multidetector CT imaging of the  chest, abdomen and pelvis was performed following the standard protocol during bolus administration of intravenous contrast. CONTRAST:  30m OMNIPAQUE IOHEXOL 350 MG/ML SOLN COMPARISON:  06/16/2019 CT chest, abdomen and pelvis. FINDINGS: CT CHEST FINDINGS Cardiovascular: Normal heart size. No significant pericardial effusion/thickening. Right internal jugular Port-A-Cath terminates at the cavoatrial junction. Atherosclerotic nonaneurysmal thoracic aorta. Normal caliber pulmonary arteries. No central pulmonary emboli. Mediastinum/Nodes: No discrete thyroid nodules. Unremarkable esophagus. No axillary adenopathy. Newly enlarged 1.1 cm high anterior mediastinal midline node (series 2/image 16). No hilar adenopathy. Lungs/Pleura: No pneumothorax. No pleural effusion. Right middle lobe masslike focus of consolidation measuring 3.6 x 2.3 cm (series 4/image 84), previously 3.5 x 2.1 cm on 06/16/2019 CT and 3.6 x 2.2 cm on 01/31/2019 CT using similar measurement technique, not substantially changed. No acute consolidative airspace disease or new significant pulmonary nodules. Musculoskeletal:  No aggressive appearing focal osseous lesions. CT ABDOMEN PELVIS FINDINGS Hepatobiliary: Normal liver with no liver mass. Normal gallbladder with no radiopaque cholelithiasis. No biliary ductal dilatation. Pancreas: Normal, with no mass or duct dilation. Spleen: Normal size. No mass. Adrenals/Urinary Tract: Normal adrenals. Normal kidneys with no hydronephrosis and no renal mass. Normal bladder. Stomach/Bowel: Normal non-distended stomach. Normal caliber small bowel with no small bowel wall thickening. Normal appendix. Normal large bowel with no diverticulosis, large bowel wall thickening or pericolonic fat stranding. Vascular/Lymphatic: Atherosclerotic nonaneurysmal abdominal aorta. Patent portal, splenic, hepatic and renal veins. Enlarged high left retroperitoneal para-aortic additional 2.3 x 1.5 cm node (series 2/image 66), new. No  pathologically enlarged lymph nodes in the abdomen or pelvis. Reproductive: Status post hysterectomy, with no abnormal findings at the vaginal cuff. No adnexal mass. Other: No pneumoperitoneum, ascites or focal fluid collection. Musculoskeletal: No aggressive appearing focal osseous lesions. IMPRESSION: 1. New high anterior mediastinal and high left retroperitoneal lymphadenopathy, suspicious for metastatic disease. 2. Chronic  right middle lobe masslike focus of consolidation, unchanged back to 01/31/2019 chest CT, indeterminate although likely postinfectious/postinflammatory scarring. 3. Aortic Atherosclerosis (ICD10-I70.0). Electronically Signed   By: Ilona Sorrel M.D.   On: 12/22/2020 19:16      Assessment/Plan Brain metastases ([place])  Hassan Buckler presents today with clinical and radiographic syndrome consistent with focal seizures localizing to the right frontal lobe, pre-motor.  There is an additional focus of abnormality, smaller, within anterior left frontal lobe.  Both lesions are thought to be metastatic lesions secondary to primary peritoneal carcinoma primary.    Initial impression by tumor board team was to treat with radiosurgery alone given proximity of right frontal lesion to primary motor gyri.  Additional discussions with Dr. Jacklynn Lewis, however, have brought into question likelihood of PPC as the primary for CNS disease, given rarity of brain mets and fact that CA-125 has remained low.    We will reach out to neurosurgery for a second evaluation, for either craniotomy or diagnostic biopsy.  Case will be reviewed by tumor board group again this coming week.    Patient is agreeable with surgery if recommended by the team.  We will reach out to her early next week following further review.  Should continue Keppra 559m BID for seizures; counseled on epilepsy safety and driving restrictions today.  We spent twenty additional minutes teaching regarding the natural history, biology, and  historical experience in the treatment of neurologic complications of cancer.   We appreciate the opportunity to participate in the care of [name] [name]   All questions were answered. The patient knows to call the clinic with any problems, questions or concerns. No barriers to learning were detected.  The total time spent in the encounter was 40 minutes and more than 50% was on counseling and review of test results   ZVentura Sellers MD Medical Director of Neuro-Oncology [facility]at [facility]10/11/22 2:11 PM

## 2020-12-25 NOTE — Progress Notes (Signed)
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[facility]) I have reviewed her recent tumor marker and previous CT imaging I am not convinced that the new lymphadenopathy seen in the peritoneum is the cause of her new brain metastasis She is completely asymptomatic The abnormal lesion is not assessable with biopsy She also have small lymphadenopathy in the retrosternal area which is also not amenable to biopsy 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; her recent breast exam was normal and the abnormal lung findings on the right side of the lung is likely due to postradiation fibrotic changes At this point in time, there is no good explanation as to the source of her brain lesions I reviewed the plan of care with the patient and her husband I also review her case with neuro oncologist PET CT scan may or may not be helpful in this situation Ultimately, her case will be presented at the next neuro-oncology conference for next step For her future follow-up for the abnormal CT findings, I plan to repeat imaging study again in 3 months  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 Mickeal Skinner for evaluation and management I will see her back after definitive plan is made for brain lesions  No orders of the defined types were placed in this encounter.   All questions were answered. The patient knows to call the clinic with any problems, questions or concerns. The total time spent in the appointment was 40 minutes encounter with patients including review of chart and various tests results, discussions about plan of care and coordination of care  plan   [name], MD 12/25/2020 4:05 PM  INTERVAL HISTORY: Please see below for problem oriented charting. she returns for hospital follow-up She was admitted briefly over the weekend when she presented with dizziness and seizures She was found to have multiple brain lesions as well as abnormal CT finding Her tumor marker was unremarkable and her blood work was completely normal She continues to have sensation of dizziness and balance issues Denies recent fall or recurrent seizures The patient and her husband have numerous questions regarding plan of care I spoke with Dr. Mickeal Skinner who sees her after my appointment for next step  REVIEW OF SYSTEMS:   Constitutional: Denies fevers, chills or abnormal weight loss Eyes: Denies blurriness of vision Ears, nose, mouth, throat, and face: Denies mucositis or sore throat Respiratory: Denies cough, dyspnea or wheezes Cardiovascular: Denies palpitation, chest discomfort or lower extremity swelling Gastrointestinal:  Denies nausea, heartburn or change in bowel habits Skin: Denies abnormal skin rashes Lymphatics: Denies new lymphadenopathy or easy bruising Behavioral/Psych: Mood is stable, no new changes  All other systems were reviewed with the patient and are negative.  I have reviewed the past medical history, past surgical history, social history and family history with the patient and they are unchanged from previous note.  ALLERGIES:  is allergic to fentanyl, lac bovis, brimonidine tartrate, ditropan [oxybutynin], milk-related compounds, other, oxybutynin chloride, thimerosal, tramadol, avelox [moxifloxacin hcl in nacl], doxycycline, erythromycin, metronidazole, minocycline, penicillins, quinolones, and sulfonamide derivatives.  MEDICATIONS:  Current Outpatient Medications  Medication Sig Dispense Refill   Azelaic Acid 15 %  cream Apply 1 application topically daily.     calcium carbonate (OS-CAL - DOSED IN MG OF ELEMENTAL CALCIUM) 1250  (500 Ca) MG tablet Take 1 tablet by mouth.     cholecalciferol (VITAMIN D3) 25 MCG (1000 UNIT) tablet Take 2,000 Units by mouth daily.     cromolyn (NASALCROM) 5.2 MG/ACT nasal spray Place 1 spray into the nose as needed for allergies.      dexamethasone (DECADRON) 4 MG tablet Take 1 tablet (4 mg total) by mouth daily. 30 tablet 1   gabapentin (NEURONTIN) 300 MG capsule Take 300 mg by mouth. Taking 600 mg in am ;300 mg at lunch; 300 mg at hs.     levETIRAcetam (KEPPRA) 500 MG tablet Take 1 tablet (500 mg total) by mouth 2 (two) times daily. 60 tablet 0   lidocaine-prilocaine (EMLA) cream APPLY TOPICALLY AS NEEDED (PORT ACCESS). (Patient not taking: No sig reported) 30 g 3   Magnesium 250 MG TABS Take 250 mg by mouth daily.     Probiotic Product (PROBIOTIC DAILY PO) Take 1 capsule by mouth daily.     risedronate (ACTONEL) 35 MG tablet Take 35 mg by mouth every 7 (seven) days. with water on empty stomach, nothing by mouth or lie down for next 30 minutes.     Sulfacetamide Sodium, Acne, 10 % LOTN Apply 1 application topically at bedtime.     VITAMIN E PO Take 50 Int'l Units/day by mouth daily.     No current facility-administered medications for this visit.    SUMMARY OF ONCOLOGIC HISTORY: Oncology History Overview Note  Negative genetics High grade serous   Peritoneal carcinoma ([place])  11/23/2018 Imaging   Ct abdomen and pelvis 1. Findings highly suspicious for peritoneal carcinomatosis. Recommend paracentesis for therapeutic and diagnostic purposes. I do not see an obvious primary lesion but there is some irregular enhancing soft tissue in the right adnexal area. CA 125 level may be helpful. 2. Mild surface irregularity involving the liver but I do not see any obvious changes of cirrhosis. No hepatic lesions.   11/23/2018 Tumor Marker   Patient's tumor was tested for the following markers: CA-125 Results of the tumor marker test revealed 253.   11/25/2018 Initial Diagnosis   Peritoneal  carcinoma ([place])   11/25/2018 Imaging   US paracentesis Successful ultrasound-guided paracentesis yielding 3.4 L of peritoneal fluid.   12/01/2018 Cancer Staging   Staging form: Ovary, Fallopian Tube, and Primary Peritoneal Carcinoma, AJCC 8th Edition - Clinical stage from 12/01/2018: cT3, cN0, cM0 - Signed by [name], MD on 12/01/2018   12/02/2018 Tumor Marker   Patient's tumor was tested for the following markers: CA-125 Results of the tumor marker test revealed 267   12/06/2018 - 05/16/2019 Chemotherapy   The patient had carboplatin and taxol for chemotherapy treatment.     12/08/2018 Procedure   Successful ultrasound-guided therapeutic paracentesis yielding 1.7 liters of peritoneal fluid.   12/24/2018 Procedure   Successful placement of a right IJ approach Power Port with ultrasound and fluoroscopic guidance. The catheter is ready for use   01/17/2019 Tumor Marker   Patient's tumor was tested for the following markers: CA-125 Results of the tumor marker test revealed 57.2   01/31/2019 Imaging   Significant decrease in peritoneal carcinomatosis since previous study. Interval resolution of ascites.   Focal area of parenchymal consolidation in central right middle lobe, which measures 3 cm. Differential diagnosis includes infectious or inflammatory process, atelectasis, and neoplasm. Recommend short-term follow-up by chest CT in  2-3 months.     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   Surgeon: Donaciano Eva    Operative Findings:  : grossly normal uterus, ovaries normal, few scattered peritoneal nodules (53m) on serosa of uterus and tubes. The omentum (gastrocolic) was tethered to the mesentery with tumor (thin rind). Complete  (optimal) resection of tumor with no gross residual disease.     02/17/2019 Pathology Results   FINAL MICROSCOPIC DIAGNOSIS: A. UTERUS, CERVIX, BILATERAL FALLOPIAN TUBES AND OVARIES, HYSTERECTOMY WITH SALPINGOOOPHORECTOMY: - Uterus: Endometrium: Inactive endometrium. No hyperplasia or malignancy. Myometrium: Unremarkable. No malignancy. Serosa: Metastatic carcinoma. No malignancy. - Cervix: Benign squamous and endocervical mucosa. No dysplasia or malignancy. - Left ovary and fallopian tube: Metastatic carcinoma. - Right ovary: No malignancy identified. - Right fallopian tube: Luminal tumor, see comment. B. OMENTUM, RESECTION: - High grade serous carcinoma. - Deposits up to at least 3 cm. - See oncology table. ONCOLOGY TABLE: OVARY or FALLOPIAN TUBE or PRIMARY PERITONEUM: Procedure: Hysterectomy with bilateral salpingo-oophorectomy and omentectomy. Specimen Integrity: Intact Tumor Site: Peritoneum Ovarian Surface Involvement (required only if applicable): Left ovary Fallopian Tube Surface Involvement (required only if applicable): Left Fallopian tube Tumor Size: Largest deposit 3 cm Histologic Type: High-grade serous carcinoma Histologic Grade: High-grade Implants (required for advanced stage serous/seromucinous borderline tumors only): Uterine serosa, left fallopian tube and ovary, omentum Other Tissue/ Organ Involvement: As above Largest Extrapelvic Peritoneal Focus (required only if applicable): 3 cm Peritoneal/Ascitic Fluid: Pre neoadjuvant [id number] positive for carcinoma. Treatment Effect (required only for high-grade serous carcinomas): Probably partial treatment effect in omental tissue. Regional Lymph Nodes: No lymph nodes submitted or found Pathologic Stage Classification (pTNM, AJCC 8th Edition): ypT3c, ypNX Representative Tumor Block: B5 Comment(s): There is surface involvement of the left ovary and fallopian tube but no primary tumor. Within the right fallopian tube  lumen there is detached fragments of tumor but again no precursor lesion is noted in the right fallopian tube. Thus, the tumor is presumed primary peritoneal and staged as such.   03/21/2019 Tumor Marker   Patient's tumor was tested for the following markers: CA-125 Results of the tumor marker test revealed 15.1    Genetic Testing   Negative genetic testing. No pathogenic variants identified on the [employer] CancerNext + RNAinsight panel. VUS in ATM called [id number] identified. The report date is 05/12/2019. TumorNext HRD was originally ordered but there was not enough sample to complete this testing.   The CancerNext+RNAinsight gene panel offered by [employer] [facility] sequencing and rearrangement analysis for the following 36 genes: APC*, ATM*, AXIN2, BARD1, BMPR1A, BRCA1*, BRCA2*, BRIP1*, CDH1*, CDK4, CDKN2A, CHEK2*, DICER1, MLH1*, MSH2*, MSH3, MSH6*, MUTYH*, NBN, NF1*, NTHL1, PALB2*, PMS2*, PTEN*, RAD51C*, RAD51D*, RECQL, SMAD4, SMARCA4, STK11 and TP53* (sequencing and deletion/duplication); HOXB13, POLD1 and POLE (sequencing only); EPCAM and GREM1 (deletion/duplication only). DNA and RNA analyses performed for * genes.    05/16/2019 Tumor Marker   Patient's tumor was tested for the following markers: CA-125 Results of the tumor marker test revealed 10.7   06/16/2019 Imaging   1. No evidence of residual or recurrent metastatic disease in the chest, abdomen or pelvis. 2. Masslike focus of consolidation in the right middle lobe along the major and minor fissures with associated volume loss is stable since 01/31/2019. Indolent primary pulmonary neoplasm not excluded. PET-CT may be considered  for further characterization. 3. Aortic Atherosclerosis (ICD10-I70.0).   06/16/2019 Tumor Marker   Patient's tumor was tested for the following markers: CA-125 Results of the tumor marker test revealed 9.1.   12/05/2019 Tumor Marker   Patient's tumor was tested for the following markers: CA-125. Results of the  tumor marker test revealed 9.0.   02/29/2020 Tumor Marker   Patient's tumor was tested for the following markers: CA-`125 Results of the tumor marker test revealed 9.7.   05/28/2020 Tumor Marker   Patient's tumor was tested for the following markers: CA-125 Results of the tumor marker test revealed 9.5   08/16/2020 Tumor Marker   Patient's tumor was tested for the following markers: CA-125 Results of the tumor marker test revealed 10.9   11/29/2020 Tumor Marker   Patient's tumor was tested for the following markers: CA-125. Results of the tumor marker test revealed 10.2.     PHYSICAL EXAMINATION: ECOG PERFORMANCE STATUS: 1 - Symptomatic but completely ambulatory  Vitals:   12/25/20 1240  BP: (!) 143/63  Pulse: (!) 101  Resp: 18  Temp: 99.1 F (37.3 C)  SpO2: 99%   Filed Weights   12/25/20 1240  Weight: 99 lb 9.6 oz (45.2 kg)    GENERAL:alert, no distress and comfortable NEURO: alert & oriented x 3 with fluent speech, with balance difficulties  LABORATORY DATA:  I have reviewed the data as listed    Component Value Date/Time   NA 139 12/23/2020 0522   K 4.2 12/23/2020 0522   CL 103 12/23/2020 0522   CO2 28 12/23/2020 0522   GLUCOSE 93 12/23/2020 0522   BUN 13 12/23/2020 0522   CREATININE 0.52 12/23/2020 0522   CREATININE 0.64 11/29/2020 1007   CALCIUM 9.7 12/23/2020 0522   PROT 7.6 12/23/2020 0522   ALBUMIN 4.1 12/23/2020 0522   AST 23 12/23/2020 0522   AST 22 11/29/2020 1007   ALT 18 12/23/2020 0522   ALT 13 11/29/2020 1007   ALKPHOS 58 12/23/2020 0522   BILITOT 0.6 12/23/2020 0522   BILITOT 0.5 11/29/2020 1007   GFRNONAA >60 12/23/2020 0522   GFRNONAA >60 11/29/2020 1007   GFRAA >60 12/05/2019 1145    No results found for: SPEP, UPEP  Lab Results  Component Value Date   WBC 5.9 12/23/2020   NEUTROABS 6.7 12/22/2020   HGB 12.7 12/23/2020   HCT 38.1 12/23/2020   MCV 91.6 12/23/2020   PLT 201 12/23/2020      Chemistry      Component Value  Date/Time   NA 139 12/23/2020 0522   K 4.2 12/23/2020 0522   CL 103 12/23/2020 0522   CO2 28 12/23/2020 0522   BUN 13 12/23/2020 0522   CREATININE 0.52 12/23/2020 0522   CREATININE 0.64 11/29/2020 1007      Component Value Date/Time   CALCIUM 9.7 12/23/2020 0522   ALKPHOS 58 12/23/2020 0522   AST 23 12/23/2020 0522   AST 22 11/29/2020 1007   ALT 18 12/23/2020 0522   ALT 13 11/29/2020 1007   BILITOT 0.6 12/23/2020 0522   BILITOT 0.5 11/29/2020 1007       RADIOGRAPHIC STUDIES: I have reviewed multiple imaging studies with the patient and her husband I have personally reviewed the radiological images as listed and agreed with the findings in the report. MR Brain W and Wo Contrast  Result Date: 12/22/2020 CLINICAL DATA:  Dizziness, nonspecific; history of cancer, new seizure, dizziness. EXAM: MRI HEAD WITHOUT AND WITH CONTRAST TECHNIQUE: Multiplanar, multiecho pulse  sequences of the brain and surrounding structures were obtained without and with intravenous contrast. CONTRAST:  4.38m GADAVIST GADOBUTROL 1 MMOL/ML IV SOLN COMPARISON:  Brain MRI 03/22/2011. FINDINGS: Brain: Mild generalized cerebral and cerebellar atrophy. Peripherally enhancing centrally cystic/necrotic lesion within the cortical/subcortical posterior right frontal lobe, measuring 2.5 x 2.2 x 2.2 cm (for instance as seen on series 20, image 110) (series 23, image 16). There is mild surrounding edema. There is also minimal SWI signal loss associated with this lesion which may reflect mineralization, nonacute blood products or prominent vessels. Additional 1.2 cm enhancing lesion within the left parietal lobe (for instance as seen on series 23, image 10). Mild edema surrounds this lesion as well. Background mild multifocal T2 FLAIR hyperintense signal abnormality within the cerebral white matter, nonspecific but compatible with chronic small vessel ischemic disease. There is no acute infarct. No extra-axial fluid collection. No  midline shift. Vascular: Maintained flow voids within the proximal large arterial vessels. Skull and upper cervical spine: No focal suspicious marrow lesion. Susceptibility artifact arising from cervical spinal fusion hardware. Sinuses/Orbits: Visualized orbits show no acute finding. Trace bilateral ethmoid sinus mucosal thickening. IMPRESSION: Two enhancing parenchymal lesions, likely reflecting intracranial metastatic disease, one measuring 2.5 x 2.2 cm within the posterior right frontal lobe, and the other measuring 1.2 cm within the left parietal lobe. Mild edema surrounding both lesions. Mild chronic small vessel ischemic changes within the cerebral white matter. Mild generalized parenchymal atrophy. Electronically Signed   By: KKellie SimmeringD.O.   On: 12/22/2020 15:16   CT CHEST ABDOMEN PELVIS W CONTRAST  Result Date: 12/22/2020 CLINICAL DATA:  History of primary peritoneal carcinoma diagnosed September 2020 status post chemotherapy, TAHBSO, radical tumor debulking and omentectomy 02/17/2019. Additional history of breast cancer. Patient presents with new headache with 2 new brain masses. Restaging. EXAM: CT CHEST, ABDOMEN, AND PELVIS WITH CONTRAST TECHNIQUE: Multidetector CT imaging of the chest, abdomen and pelvis was performed following the standard protocol during bolus administration of intravenous contrast. CONTRAST:  824mOMNIPAQUE IOHEXOL 350 MG/ML SOLN COMPARISON:  06/16/2019 CT chest, abdomen and pelvis. FINDINGS: CT CHEST FINDINGS Cardiovascular: Normal heart size. No significant pericardial effusion/thickening. Right internal jugular Port-A-Cath terminates at the cavoatrial junction. Atherosclerotic nonaneurysmal thoracic aorta. Normal caliber pulmonary arteries. No central pulmonary emboli. Mediastinum/Nodes: No discrete thyroid nodules. Unremarkable esophagus. No axillary adenopathy. Newly enlarged 1.1 cm high anterior mediastinal midline node (series 2/image 16). No hilar adenopathy.  Lungs/Pleura: No pneumothorax. No pleural effusion. Right middle lobe masslike focus of consolidation measuring 3.6 x 2.3 cm (series 4/image 84), previously 3.5 x 2.1 cm on 06/16/2019 CT and 3.6 x 2.2 cm on 01/31/2019 CT using similar measurement technique, not substantially changed. No acute consolidative airspace disease or new significant pulmonary nodules. Musculoskeletal:  No aggressive appearing focal osseous lesions. CT ABDOMEN PELVIS FINDINGS Hepatobiliary: Normal liver with no liver mass. Normal gallbladder with no radiopaque cholelithiasis. No biliary ductal dilatation. Pancreas: Normal, with no mass or duct dilation. Spleen: Normal size. No mass. Adrenals/Urinary Tract: Normal adrenals. Normal kidneys with no hydronephrosis and no renal mass. Normal bladder. Stomach/Bowel: Normal non-distended stomach. Normal caliber small bowel with no small bowel wall thickening. Normal appendix. Normal large bowel with no diverticulosis, large bowel wall thickening or pericolonic fat stranding. Vascular/Lymphatic: Atherosclerotic nonaneurysmal abdominal aorta. Patent portal, splenic, hepatic and renal veins. Enlarged high left retroperitoneal para-aortic additional 2.3 x 1.5 cm node (series 2/image 66), new. No pathologically enlarged lymph nodes in the abdomen or pelvis. Reproductive: Status post hysterectomy, with  no abnormal findings at the vaginal cuff. No adnexal mass. Other: No pneumoperitoneum, ascites or focal fluid collection. Musculoskeletal: No aggressive appearing focal osseous lesions. IMPRESSION: 1. New high anterior mediastinal and high left retroperitoneal lymphadenopathy, suspicious for metastatic disease. 2. Chronic right middle lobe masslike focus of consolidation, unchanged back to 01/31/2019 chest CT, indeterminate although likely postinfectious/postinflammatory scarring. 3. Aortic Atherosclerosis (ICD10-I70.0). Electronically Signed   By: Ilona Sorrel M.D.   On: 12/22/2020 19:16

## 2020-12-25 NOTE — Telephone Encounter (Signed)
Called to verify appt today is at 1 pm. She is agreeable and is aware of appt.

## 2020-12-26 ENCOUNTER — Encounter: Payer: Self-pay | Admitting: Hematology and Oncology

## 2020-12-26 ENCOUNTER — Telehealth: Payer: Self-pay | Admitting: Internal Medicine

## 2020-12-26 NOTE — Telephone Encounter (Signed)
Scheduled appt per 10/11 los. Pt is aware.

## 2020-12-31 ENCOUNTER — Inpatient Hospital Stay (HOSPITAL_BASED_OUTPATIENT_CLINIC_OR_DEPARTMENT_OTHER): Payer: Medicare Other | Admitting: Internal Medicine

## 2020-12-31 ENCOUNTER — Inpatient Hospital Stay: Payer: Medicare Other

## 2020-12-31 DIAGNOSIS — C7931 Secondary malignant neoplasm of brain: Secondary | ICD-10-CM

## 2020-12-31 NOTE — Progress Notes (Signed)
I connected with [name] [name] on 12/31/20 at  2:00 PM EDT by telephone visit and verified that I am speaking with the correct person using two identifiers.  I discussed the limitations, risks, security and privacy concerns of performing an evaluation and management service by telemedicine and the availability of in-person appointments. I also discussed with the patient that there may be a patient responsible charge related to this service. The patient expressed understanding and agreed to proceed.  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  No new or progressive deficits.  No issues with gait, headaches. 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  Agreeable to proceed with craniotomy, planned resection of right frontal metastasis.  Follow Up Instructions: She will get scheduled with clinic appt with Dr. Reatha Armour.  We will touch base with Dr. Alvy Bimler and follow up after surgery.  I discussed the assessment and treatment plan with the patient.  The patient was provided an opportunity to ask questions and all were answered.  The patient agreed with the plan and demonstrated understanding of the instructions.    The patient was advised to call back or seek an in-person evaluation if the symptoms worsen or if the condition fails to improve as anticipated.  I provided 5-10 minutes of non-face-to-face time during this enocunter.  Ventura Sellers, MD   I provided 15 minutes of non face-to-face telephone visit time during this encounter, and > 50% was spent counseling as documented under my assessment & plan.

## 2021-01-02 ENCOUNTER — Other Ambulatory Visit: Payer: Self-pay | Admitting: Radiation Therapy

## 2021-01-02 ENCOUNTER — Other Ambulatory Visit: Payer: Self-pay | Admitting: Neurological Surgery

## 2021-01-02 DIAGNOSIS — C7931 Secondary malignant neoplasm of brain: Secondary | ICD-10-CM | POA: Diagnosis not present

## 2021-01-03 ENCOUNTER — Other Ambulatory Visit: Payer: Self-pay | Admitting: Hematology and Oncology

## 2021-01-04 ENCOUNTER — Telehealth: Payer: Self-pay | Admitting: Cardiology

## 2021-01-04 NOTE — Telephone Encounter (Signed)
Patient would like to speak to Dr. Radford Pax. Wouldn't leave any further information.

## 2021-01-04 NOTE — Telephone Encounter (Signed)
   [place] Pre-operative Risk Assessment    Patient Name: GLENDOLA FRIEDHOFF  DOB: 08/16/1950 MRN: [id number]  HEARTCARE STAFF:  - IMPORTANT!!!!!! Under Visit Info/Reason for Call, type in Other and utilize the format Clearance MM/DD/YY or Clearance TBD. Do not use dashes or single digits. - Please review there is not already an duplicate clearance open for this procedure. - If request is for dental extraction, please clarify the # of teeth to be extracted. - If the patient is currently at the dentist's office, call Pre-Op Callback Staff (MA/nurse) to input urgent request.  - If the patient is not currently in the dentist office, please route to the Pre-Op pool.  Request for surgical clearance:  What type of surgery is being performed? CRANIOTOMY FOR TUMOR  When is this surgery scheduled? 01/22/21  What type of clearance is required (medical clearance vs. Pharmacy clearance to hold med vs. Both)? MEDICAL  Are there any medications that need to be held prior to surgery and how long? NONE LISTED  Practice name and name of physician performing surgery? [facility]  What is the office phone number? [phone]   7.   What is the office fax number? [phone]  8.   Anesthesia type (None, local, MAC, general) ? GENERAL   Jacinta Shoe 01/04/2021, 3:52 PM  _________________________________________________________________   (provider comments below)

## 2021-01-04 NOTE — Telephone Encounter (Signed)
Spoke with the patient who reports that she found out that she has two tumors in her brain. She is scheduled for surgery on 11/9 to have one of them removed. She was originally scheduled for an echocardiogram on 11/9 so she has cancelled the test and wanted to make Dr. Radford Pax aware of her reason for cancelling. Patient would like to wait until after her procedure to have the echocardiogram done. Patient is not sure if her surgeon is requesting cardiac clearance prior to her surgery. She is going to speak with them to clarify. I advised her that if they are requesting clearance to have them send over a request to our office ASAP in case that Dr. Radford Pax and our pre-op team feel that she needs to have the echocardiogram prior to surgery. Patient verbalized understanding.

## 2021-01-04 NOTE — Telephone Encounter (Signed)
   Primary Cardiologist: Fransico Him, MD  Chart reviewed as part of pre-operative protocol coverage. Given past medical history and time since last visit, based on ACC/AHA guidelines, [name] [name] would be at acceptable risk for the planned procedure without further cardiovascular testing.   I will route this recommendation to the requesting party via Epic fax function and remove from pre-op pool.  Please call with questions.  Jossie Ng. Letisia Schwalb NP-C    01/04/2021, 4:02 PM [address] Office [phone] Fax [phone]

## 2021-01-09 ENCOUNTER — Ambulatory Visit: Payer: Medicare Other | Admitting: Psychiatry

## 2021-01-17 ENCOUNTER — Ambulatory Visit
Admission: RE | Admit: 2021-01-17 | Discharge: 2021-01-17 | Disposition: A | Discharge status: Home / Self Care | Payer: Medicare Other | Source: Ambulatory Visit | Attending: Radiation Oncology | Admitting: Radiation Oncology

## 2021-01-17 DIAGNOSIS — C7931 Secondary malignant neoplasm of brain: Secondary | ICD-10-CM

## 2021-01-17 DIAGNOSIS — C50919 Malignant neoplasm of unspecified site of unspecified female breast: Secondary | ICD-10-CM | POA: Diagnosis not present

## 2021-01-17 MED ORDER — GADOBENATE DIMEGLUMINE 529 MG/ML IV SOLN
9.0000 mL | Freq: Once | INTRAVENOUS | Status: AC | PRN
  Administered 2021-01-17: 9 mL via INTRAVENOUS

## 2021-01-17 NOTE — Progress Notes (Addendum)
Surgical Instructions    Your procedure is scheduled on 01/22/21.  Report to [facility] Main Entrance "A" at 9:35 A.M., then check in with the Admitting office.  Call this number if you have problems the morning of surgery:  [phone]   If you have any questions prior to your surgery date call [phone]: Open Monday-Friday 8am-4pm    Remember:  Do not eat or drink after midnight the night before your surgery     Take these medicines the morning of surgery with A SIP OF WATER:  dexamethasone (DECADRON) gabapentin (NEURONTIN)  levETIRAcetam (KEPPRA)  risedronate (ACTONEL) - if dose scheduled cromolyn (NASALCROM) - if needed   As of today, STOP taking any Aspirin (unless otherwise instructed by your surgeon) Aleve, Naproxen, Ibuprofen, Motrin, Advil, Goody's, BC's, all herbal medications, fish oil, and all vitamins.     After your COVID test   You are not required to quarantine however you are required to wear a well-fitting mask when you are out and around people not in your household.  If your mask becomes wet or soiled, replace with a new one.  Wash your hands often with soap and water for 20 seconds or clean your hands with an alcohol-based hand sanitizer that contains at least 60% alcohol.  Do not share personal items.  Notify your provider: if you are in close contact with someone who has COVID  or if you develop a fever of 100.4 or greater, sneezing, cough, sore throat, shortness of breath or body aches.           Do not wear jewelry or makeup Do not wear lotions, powders, perfumes or deodorant. Do not shave 48 hours prior to surgery.   Do not bring valuables to the hospital. DO Not wear nail polish, gel polish, artificial nails, or any other type of covering on natural nails including finger and toenails. If patients have artificial nails, gel coating, etc. that need to be removed by a nail salon, please have this removed prior to surgery or surgery may need to  be canceled/delayed if the surgeon/ anesthesia feels like the patient is unable to be adequately monitored.             [facility] is not responsible for any belongings or valuables.  Do NOT Smoke (Tobacco/Vaping)  24 hours prior to your procedure  If you use a CPAP at night, you may bring your mask for your overnight stay.   Contacts, glasses, hearing aids, dentures or partials may not be worn into surgery, please bring cases for these belongings   For patients admitted to the hospital, discharge time will be determined by your treatment team.   Patients discharged the day of surgery will not be allowed to drive home, and someone needs to stay with them for 24 hours.  NO VISITORS WILL BE ALLOWED IN PRE-OP WHERE PATIENTS ARE PREPPED FOR SURGERY.  ONLY 1 SUPPORT PERSON MAY BE PRESENT IN THE WAITING ROOM WHILE YOU ARE IN SURGERY.  IF YOU ARE TO BE ADMITTED, ONCE YOU ARE IN YOUR ROOM YOU WILL BE ALLOWED TWO (2) VISITORS. 1 (ONE) VISITOR MAY STAY OVERNIGHT BUT MUST ARRIVE TO THE ROOM BY 8pm.  Minor children may have two parents present. Special consideration for safety and communication needs will be reviewed on a case by case basis.  Special instructions:    Oral Hygiene is also important to reduce your risk of infection.  Remember - BRUSH YOUR TEETH THE MORNING OF SURGERY  WITH YOUR REGULAR TOOTHPASTE   [facility]- Preparing For Surgery  Before surgery, you can play an important role. Because skin is not sterile, your skin needs to be as free of germs as possible. You can reduce the number of germs on your skin by washing with CHG (chlorahexidine gluconate) Soap before surgery.  CHG is an antiseptic cleaner which kills germs and bonds with the skin to continue killing germs even after washing.     Please do not use if you have an allergy to CHG or antibacterial soaps. If your skin becomes reddened/irritated stop using the CHG.  Do not shave (including legs and underarms) for at least 48  hours prior to first CHG shower. It is OK to shave your face.  Please follow these instructions carefully.     Shower the NIGHT BEFORE SURGERY and the MORNING OF SURGERY with CHG Soap.   If you chose to wash your hair, wash your hair first as usual with your normal shampoo. After you shampoo, rinse your hair and body thoroughly to remove the shampoo.  Then [employer] and genitals (private parts) with your normal soap and rinse thoroughly to remove soap.  After that Use CHG Soap as you would any other liquid soap. You can apply CHG directly to the skin and wash gently with a scrungie or a clean washcloth.   Apply the CHG Soap to your body ONLY FROM THE NECK DOWN.  Do not use on open wounds or open sores. Avoid contact with your eyes, ears, mouth and genitals (private parts). Wash Face and genitals (private parts)  with your normal soap.   Wash thoroughly, paying special attention to the area where your surgery will be performed.  Thoroughly rinse your body with warm water from the neck down.  DO NOT shower/wash with your normal soap after using and rinsing off the CHG Soap.  Pat yourself dry with a CLEAN TOWEL.  Wear CLEAN PAJAMAS to bed the night before surgery  Place CLEAN SHEETS on your bed the night before your surgery  DO NOT SLEEP WITH PETS.   Day of Surgery:  Take a shower with CHG soap. Wear Clean/Comfortable clothing the morning of surgery Do not apply any deodorants/lotions.   Remember to brush your teeth WITH YOUR REGULAR TOOTHPASTE.   Please read over the following fact sheets that you were given.

## 2021-01-18 ENCOUNTER — Encounter (HOSPITAL_COMMUNITY)
Admission: RE | Admit: 2021-01-18 | Discharge: 2021-01-18 | Disposition: A | Discharge status: Home / Self Care | Payer: Medicare Other | Source: Ambulatory Visit | Attending: Neurological Surgery | Admitting: Neurological Surgery

## 2021-01-18 ENCOUNTER — Encounter (HOSPITAL_COMMUNITY): Payer: Self-pay

## 2021-01-18 DIAGNOSIS — I081 Rheumatic disorders of both mitral and tricuspid valves: Secondary | ICD-10-CM | POA: Diagnosis not present

## 2021-01-18 DIAGNOSIS — Z853 Personal history of malignant neoplasm of breast: Secondary | ICD-10-CM | POA: Diagnosis not present

## 2021-01-18 DIAGNOSIS — Z01812 Encounter for preprocedural laboratory examination: Secondary | ICD-10-CM | POA: Insufficient documentation

## 2021-01-18 DIAGNOSIS — H409 Unspecified glaucoma: Secondary | ICD-10-CM | POA: Diagnosis not present

## 2021-01-18 DIAGNOSIS — Z923 Personal history of irradiation: Secondary | ICD-10-CM | POA: Diagnosis not present

## 2021-01-18 DIAGNOSIS — Z01818 Encounter for other preprocedural examination: Secondary | ICD-10-CM

## 2021-01-18 DIAGNOSIS — C7931 Secondary malignant neoplasm of brain: Secondary | ICD-10-CM | POA: Diagnosis not present

## 2021-01-18 DIAGNOSIS — I447 Left bundle-branch block, unspecified: Secondary | ICD-10-CM | POA: Insufficient documentation

## 2021-01-18 DIAGNOSIS — I700 Atherosclerosis of aorta: Secondary | ICD-10-CM | POA: Insufficient documentation

## 2021-01-18 DIAGNOSIS — K219 Gastro-esophageal reflux disease without esophagitis: Secondary | ICD-10-CM | POA: Diagnosis not present

## 2021-01-18 DIAGNOSIS — M797 Fibromyalgia: Secondary | ICD-10-CM | POA: Insufficient documentation

## 2021-01-18 DIAGNOSIS — Z20822 Contact with and (suspected) exposure to covid-19: Secondary | ICD-10-CM | POA: Insufficient documentation

## 2021-01-18 DIAGNOSIS — K589 Irritable bowel syndrome without diarrhea: Secondary | ICD-10-CM | POA: Diagnosis not present

## 2021-01-18 LAB — TYPE AND SCREEN
ABO/RH(D): A POS
Antibody Screen: NEGATIVE

## 2021-01-18 LAB — SARS CORONAVIRUS 2 (TAT 6-24 HRS): SARS Coronavirus 2: NEGATIVE

## 2021-01-18 NOTE — Progress Notes (Signed)
Surgical Instructions    Your procedure is scheduled on 01/22/21.  Report to [facility] Main Entrance "A" at 9:35 A.M., then check in with the Admitting office.  Call this number if you have problems the morning of surgery:  [phone]   If you have any questions prior to your surgery date call [phone]: Open Monday-Friday 8am-4pm    Remember:  Do not eat or drink after midnight the night before your surgery     Take these medicines the morning of surgery with A SIP OF WATER: gabapentin (NEURONTIN)  levETIRAcetam (KEPPRA)  risedronate (ACTONEL) - if dose scheduled cromolyn (NASALCROM) - if needed   As of today, STOP taking any Aspirin (unless otherwise instructed by your surgeon) Aleve, Naproxen, Ibuprofen, Motrin, Advil, Goody's, BC's, all herbal medications, fish oil, and all vitamins.     After your COVID test   You are not required to quarantine however you are required to wear a well-fitting mask when you are out and around people not in your household.  If your mask becomes wet or soiled, replace with a new one.  Wash your hands often with soap and water for 20 seconds or clean your hands with an alcohol-based hand sanitizer that contains at least 60% alcohol.  Do not share personal items.  Notify your provider: if you are in close contact with someone who has COVID  or if you develop a fever of 100.4 or greater, sneezing, cough, sore throat, shortness of breath or body aches.           Do not wear jewelry or makeup Do not wear lotions, powders, perfumes or deodorant. Do not shave 48 hours prior to surgery.   Do not bring valuables to the hospital. DO Not wear nail polish, gel polish, artificial nails, or any other type of covering on natural nails including finger and toenails. If patients have artificial nails, gel coating, etc. that need to be removed by a nail salon, please have this removed prior to surgery or surgery may need to be canceled/delayed if the  surgeon/ anesthesia feels like the patient is unable to be adequately monitored.             [facility] is not responsible for any belongings or valuables.  Do NOT Smoke (Tobacco/Vaping)  24 hours prior to your procedure  If you use a CPAP at night, you may bring your mask for your overnight stay.   Contacts, glasses, hearing aids, dentures or partials may not be worn into surgery, please bring cases for these belongings   For patients admitted to the hospital, discharge time will be determined by your treatment team.   Patients discharged the day of surgery will not be allowed to drive home, and someone needs to stay with them for 24 hours.  NO VISITORS WILL BE ALLOWED IN PRE-OP WHERE PATIENTS ARE PREPPED FOR SURGERY.  ONLY 1 SUPPORT PERSON MAY BE PRESENT IN THE WAITING ROOM WHILE YOU ARE IN SURGERY.  IF YOU ARE TO BE ADMITTED, ONCE YOU ARE IN YOUR ROOM YOU WILL BE ALLOWED TWO (2) VISITORS. 1 (ONE) VISITOR MAY STAY OVERNIGHT BUT MUST ARRIVE TO THE ROOM BY 8pm.  Minor children may have two parents present. Special consideration for safety and communication needs will be reviewed on a case by case basis.  Special instructions:    Oral Hygiene is also important to reduce your risk of infection.  Remember - BRUSH YOUR TEETH THE MORNING OF SURGERY WITH YOUR REGULAR  TOOTHPASTE   [facility]- Preparing For Surgery  Before surgery, you can play an important role. Because skin is not sterile, your skin needs to be as free of germs as possible. You can reduce the number of germs on your skin by washing with CHG (chlorahexidine gluconate) Soap before surgery.  CHG is an antiseptic cleaner which kills germs and bonds with the skin to continue killing germs even after washing.     Please do not use if you have an allergy to CHG or antibacterial soaps. If your skin becomes reddened/irritated stop using the CHG.  Do not shave (including legs and underarms) for at least 48 hours prior to first CHG  shower. It is OK to shave your face.  Please follow these instructions carefully.     Shower the NIGHT BEFORE SURGERY and the MORNING OF SURGERY with CHG Soap.   If you chose to wash your hair, wash your hair first as usual with your normal shampoo. After you shampoo, rinse your hair and body thoroughly to remove the shampoo.  Then [employer] and genitals (private parts) with your normal soap and rinse thoroughly to remove soap.  After that Use CHG Soap as you would any other liquid soap. You can apply CHG directly to the skin and wash gently with a scrungie or a clean washcloth.   Apply the CHG Soap to your body ONLY FROM THE NECK DOWN.  Do not use on open wounds or open sores. Avoid contact with your eyes, ears, mouth and genitals (private parts). Wash Face and genitals (private parts)  with your normal soap.   Wash thoroughly, paying special attention to the area where your surgery will be performed.  Thoroughly rinse your body with warm water from the neck down.  DO NOT shower/wash with your normal soap after using and rinsing off the CHG Soap.  Pat yourself dry with a CLEAN TOWEL.  Wear CLEAN PAJAMAS to bed the night before surgery  Place CLEAN SHEETS on your bed the night before your surgery  DO NOT SLEEP WITH PETS.   Day of Surgery:  Take a shower with CHG soap. Wear Clean/Comfortable clothing the morning of surgery Do not apply any deodorants/lotions.   Remember to brush your teeth WITH YOUR REGULAR TOOTHPASTE.   Please read over the following fact sheets that you were given.

## 2021-01-18 NOTE — Progress Notes (Signed)
PCP - [facility], MD Cardiologist - Golden Hurter, MD  PPM/ICD - n/a  Chest x-ray - n/a EKG - 12/20/20 Stress Test - 07/25/14 ECHO - 07/25/14 Cardiac Cath - pt denies  Sleep Study - pt denies  Fasting Blood Sugar - n/a  Blood Thinner Instructions: n/a Aspirin Instructions:  As of today, STOP taking any Aspirin (unless otherwise instructed by your surgeon) Aleve, Naproxen, Ibuprofen, Motrin, Advil, Goody's, BC's, all herbal medications, fish oil, and all vitamins.  NPO at midnight  COVID TEST- 01/18/21 in PAT  Anesthesia review: yes, cardiac hx  Patient denies shortness of breath, fever, cough and chest pain at PAT appointment   All instructions explained to the patient, with a verbal understanding of the material. Patient agrees to go over the instructions while at home for a better understanding. Patient also instructed to self quarantine after being tested for COVID-19. The opportunity to ask questions was provided.

## 2021-01-21 ENCOUNTER — Encounter (HOSPITAL_COMMUNITY): Payer: Self-pay

## 2021-01-21 NOTE — Progress Notes (Signed)
Anesthesia Chart Review:  Case: [id number] Date/Time: 01/22/21 1120   Procedures:      STEREOTACTIC RT FRONTAL CRANIOTOMY DOE RESECTION OF TUMOR     APPLICATION OF CRANIAL NAVIGATION   Anesthesia type: General   Pre-op diagnosis: BRAIN METASTASIS   Location: [facility] OR ROOM 59 / [place] OR   Surgeons: Dawley, Theodoro Doing, DO       DISCUSSION: Patient is a 70 year old female scheduled for the above procedure.  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  She presented to ED on 12/22/2020 with new onset seizure activity.  Imaging concerning for metastatic brain tumors as well as new anterior mediastinal and high left retroperitoneal lymphadenopathy also concerning for metastatic disease.  She was started on Keppra and has had follow-up with [facility], [facility], and Neurosurgery. 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  Preoperative cardiology input outlined on 01/04/2021 by Coletta Memos, NP, "Given past medical history and time since last visit, based on ACC/AHA guidelines, JANNATUL WOJDYLA would be at acceptable risk for the planned procedure without further cardiovascular testing."  01/18/2021 presurgical COVID-19 test negative.  Anesthesia team to evaluate on the day of surgery.   VS: BP (P) 124/63   Pulse (P) 86    Temp (P) 36.6 C (Oral)   Resp (P) 17   Ht (P) 5\' 5"  (1.651 m)   Wt (P) 45 kg   SpO2 (P) 100%   BMI (P) 16.49 kg/m   PROVIDERS: Orpah Melter, MD is PCP  Golden Hurter, MD is cardiologist. Last visit 12/20/20. [name], MD is [facility] Cecil Cobbs, MD is Madolyn Frieze, Terrence Dupont, MD is [facility]   LABS: She had normal CBC and CMET on 12/23/20. T&S done on 01/18/21.   Labs Reviewed  SARS CORONAVIRUS 2 (TAT 6-24 HRS)  TYPE AND SCREEN    IMAGES: MRI Brain 01/17/21: IMPRESSION: 1. Progression of disease as evidenced by increased size of 2 dominant lesions, measuring 33 x 22 x 27 mm and 12 x 8 x 8 mm respectively. 2. No new lesions. 3. Stable atrophy and white matter disease. This likely reflects the sequela of chronic microvascular ischemia.   CT Chest/abd/pelvis 12/22/20: IMPRESSION: 1. New high anterior mediastinal and high left retroperitoneal lymphadenopathy, suspicious for metastatic disease. 2. Chronic right middle lobe masslike focus of consolidation, unchanged back to 01/31/2019 chest CT, indeterminate although likely postinfectious/postinflammatory scarring. 3. Aortic Atherosclerosis (ICD10-I70.0).    EKG: 12/20/20: NSR, LBBB   CV: Echo 07/25/14: Study Conclusions  - Left ventricle: Abnormal septal motoin. The cavity size was    mildly dilated. Wall thickness was normal. Systolic function was    mildly reduced. The estimated ejection fraction was in the range    of 45% to 50%.  - Atrial septum: No defect or patent foramen ovale was identified.    Nuclear stress test 07/25/14: Myocardial perfusion is abnormal. There is a moderate-sized fixed septal defect, secondary to LBBB. No reversible ischemia This is a low risk study.  Overall left ventricular systolic function was normal. The left ventricular ejection fraction is normal (55-65%). There is no prior study for comparison.   Past Medical History:  Diagnosis Date   Allergic rhinitis    Cervical dysplasia  1980   Cervicalgia    Eczema    Rosacea,dermatitis-Dr Stinehelfer   Esophageal reflux    Family history of colon cancer    Family history of melanoma    Family history of ovarian cancer    Family history of pancreatic cancer    Family history of prostate cancer    Family history of uterine cancer    Fibromyalgia    GERD (gastroesophageal reflux disease)    Glaucoma, narrow-angle    Heart murmur    Hiatal hernia    Irritable bowel syndrome    LBBB (left bundle branch block)    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), unspecified site    DCIS   Mass of lung    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 Arlyce Dice   Mitral valve disorders(424.0)    No antibiotics required   Osteoporosis 12/2016   T score -2.3, 2014 T score -2.5 AP spine   PAC (premature atrial contraction) 2012   ,PVC's, and nonsustained atril tachycardia w aberration by heart monitor    PVC (premature ventricular contraction)    Rosacea    Stricture and stenosis of esophagus     Past Surgical History:  Procedure Laterality Date   BREAST LUMPECTOMY  1998   right breast with radiation   CERVICAL DISCECTOMY     C3-7 with bone graft, in three different surgeries, have titanium plate and screws   GYNECOLOGIC CRYOSURGERY  1980   IR IMAGING GUIDED PORT INSERTION  12/24/2018   NECK SURGERY  1993-1997   bone graft and fusion   OMENTECTOMY  02/17/2019   REFRACTIVE SURGERY     narrow angle glucoma   RHINOPLASTY  1976   sterotactic large core needle biopsy right breast Right 10/15/1996   TUBAL LIGATION  1977    MEDICATIONS:  Azelaic Acid 15 % cream   calcium carbonate (OS-CAL - DOSED IN MG OF ELEMENTAL CALCIUM) 1250 (500 Ca) MG tablet   cholecalciferol (VITAMIN D3) 25 MCG (1000 UNIT) tablet   cromolyn (NASALCROM) 5.2 MG/ACT nasal spray   dexamethasone (DECADRON) 4 MG tablet   gabapentin (NEURONTIN) 300 MG capsule   levETIRAcetam (KEPPRA) 500 MG tablet   lidocaine-prilocaine (EMLA) cream   Magnesium 250 MG TABS    Probiotic Product (PROBIOTIC DAILY PO)   risedronate (ACTONEL) 35 MG tablet   Sulfacetamide Sodium, Acne, 10 % LOTN   VITAMIN E PO   No current facility-administered medications for this encounter.    Myra Gianotti, PA-C Surgical Short Stay/Anesthesiology [facility] Phone [phone] [facility] Phone [phone] 01/21/2021 11:53 AM

## 2021-01-21 NOTE — Anesthesia Preprocedure Evaluation (Addendum)
Anesthesia Evaluation  Patient identified by MRN, date of birth, ID band Patient awake    Reviewed: Allergy & Precautions, H&P , NPO status , Patient's Chart, lab work & pertinent test results  Airway Mallampati: II   Neck ROM: full    Dental   Pulmonary neg pulmonary ROS,    breath sounds clear to auscultation       Cardiovascular + dysrhythmias + Valvular Problems/Murmurs  Rhythm:regular Rate:Normal     Neuro/Psych Seizures -,   Neuromuscular disease    GI/Hepatic hiatal hernia, GERD  ,  Endo/Other    Renal/GU      Musculoskeletal  (+) Fibromyalgia -  Abdominal   Peds  Hematology   Anesthesia Other Findings   Reproductive/Obstetrics                             Anesthesia Physical Anesthesia Plan  ASA: 3  Anesthesia Plan: General   Post-op Pain Management:    Induction: Intravenous  PONV Risk Score and Plan: 3 and Ondansetron, Dexamethasone, Midazolam and Treatment may vary due to age or medical condition  Airway Management Planned: Oral ETT  Additional Equipment: Arterial line  Intra-op Plan:   Post-operative Plan: Extubation in OR  Informed Consent: I have reviewed the patients History and Physical, chart, labs and discussed the procedure including the risks, benefits and alternatives for the proposed anesthesia with the patient or authorized representative who has indicated his/her understanding and acceptance.     Dental advisory given  Plan Discussed with: CRNA, Anesthesiologist and Surgeon  Anesthesia Plan Comments: (PAT note written 01/21/2021 by Myra Gianotti, PA-C. )       Anesthesia Quick Evaluation

## 2021-01-22 ENCOUNTER — Other Ambulatory Visit (HOSPITAL_COMMUNITY): Payer: Medicare Other

## 2021-01-22 ENCOUNTER — Inpatient Hospital Stay (HOSPITAL_COMMUNITY): Payer: Medicare Other | Admitting: Vascular Surgery

## 2021-01-22 ENCOUNTER — Inpatient Hospital Stay (HOSPITAL_COMMUNITY): Payer: Medicare Other | Admitting: General Practice

## 2021-01-22 ENCOUNTER — Other Ambulatory Visit: Payer: Self-pay

## 2021-01-22 ENCOUNTER — Encounter (HOSPITAL_COMMUNITY): Payer: Self-pay | Admitting: Neurological Surgery

## 2021-01-22 ENCOUNTER — Inpatient Hospital Stay (HOSPITAL_COMMUNITY)
Admission: RE | Admit: 2021-01-22 | Discharge: 2021-01-23 | DRG: 027 | Disposition: A | Discharge status: Home / Self Care | Payer: Medicare Other | Source: Ambulatory Visit | Attending: Neurological Surgery | Admitting: Neurological Surgery

## 2021-01-22 ENCOUNTER — Encounter (HOSPITAL_COMMUNITY)
Admission: RE | Disposition: A | Discharge status: Home / Self Care | Payer: Self-pay | Source: Ambulatory Visit | Attending: Neurological Surgery

## 2021-01-22 DIAGNOSIS — C719 Malignant neoplasm of brain, unspecified: Secondary | ICD-10-CM | POA: Diagnosis not present

## 2021-01-22 DIAGNOSIS — Z8249 Family history of ischemic heart disease and other diseases of the circulatory system: Secondary | ICD-10-CM | POA: Diagnosis not present

## 2021-01-22 DIAGNOSIS — Z800 Family history of malignant neoplasm of digestive organs: Secondary | ICD-10-CM | POA: Diagnosis not present

## 2021-01-22 DIAGNOSIS — Z808 Family history of malignant neoplasm of other organs or systems: Secondary | ICD-10-CM | POA: Diagnosis not present

## 2021-01-22 DIAGNOSIS — Z85028 Personal history of other malignant neoplasm of stomach: Secondary | ICD-10-CM | POA: Diagnosis not present

## 2021-01-22 DIAGNOSIS — Z79899 Other long term (current) drug therapy: Secondary | ICD-10-CM | POA: Diagnosis not present

## 2021-01-22 DIAGNOSIS — Z8049 Family history of malignant neoplasm of other genital organs: Secondary | ICD-10-CM | POA: Diagnosis not present

## 2021-01-22 DIAGNOSIS — Z8041 Family history of malignant neoplasm of ovary: Secondary | ICD-10-CM | POA: Diagnosis not present

## 2021-01-22 DIAGNOSIS — Z882 Allergy status to sulfonamides status: Secondary | ICD-10-CM | POA: Diagnosis not present

## 2021-01-22 DIAGNOSIS — R569 Unspecified convulsions: Secondary | ICD-10-CM | POA: Diagnosis not present

## 2021-01-22 DIAGNOSIS — R4702 Dysphasia: Secondary | ICD-10-CM | POA: Diagnosis not present

## 2021-01-22 DIAGNOSIS — Z885 Allergy status to narcotic agent status: Secondary | ICD-10-CM | POA: Diagnosis not present

## 2021-01-22 DIAGNOSIS — Z853 Personal history of malignant neoplasm of breast: Secondary | ICD-10-CM | POA: Diagnosis not present

## 2021-01-22 DIAGNOSIS — Z86000 Personal history of in-situ neoplasm of breast: Secondary | ICD-10-CM | POA: Diagnosis not present

## 2021-01-22 DIAGNOSIS — M797 Fibromyalgia: Secondary | ICD-10-CM | POA: Diagnosis present

## 2021-01-22 DIAGNOSIS — Z8509 Personal history of malignant neoplasm of other digestive organs: Secondary | ICD-10-CM | POA: Diagnosis not present

## 2021-01-22 DIAGNOSIS — G936 Cerebral edema: Secondary | ICD-10-CM | POA: Diagnosis not present

## 2021-01-22 DIAGNOSIS — I493 Ventricular premature depolarization: Secondary | ICD-10-CM | POA: Diagnosis not present

## 2021-01-22 DIAGNOSIS — Z880 Allergy status to penicillin: Secondary | ICD-10-CM

## 2021-01-22 DIAGNOSIS — Z888 Allergy status to other drugs, medicaments and biological substances status: Secondary | ICD-10-CM

## 2021-01-22 DIAGNOSIS — Z823 Family history of stroke: Secondary | ICD-10-CM | POA: Diagnosis not present

## 2021-01-22 DIAGNOSIS — G939 Disorder of brain, unspecified: Secondary | ICD-10-CM | POA: Diagnosis not present

## 2021-01-22 DIAGNOSIS — Z8589 Personal history of malignant neoplasm of other organs and systems: Secondary | ICD-10-CM | POA: Diagnosis not present

## 2021-01-22 DIAGNOSIS — C7931 Secondary malignant neoplasm of brain: Principal | ICD-10-CM | POA: Diagnosis present

## 2021-01-22 HISTORY — PX: CRANIOTOMY: SHX93

## 2021-01-22 HISTORY — PX: APPLICATION OF CRANIAL NAVIGATION: SHX6578

## 2021-01-22 LAB — GLUCOSE, CAPILLARY: Glucose-Capillary: 128 mg/dL — ABNORMAL HIGH (ref 70–99)

## 2021-01-22 LAB — POCT I-STAT 7, (LYTES, BLD GAS, ICA,H+H)
Acid-Base Excess: 5 mmol/L — ABNORMAL HIGH (ref 0.0–2.0)
Bicarbonate: 28.7 mmol/L — ABNORMAL HIGH (ref 20.0–28.0)
Calcium, Ion: 1.19 mmol/L (ref 1.15–1.40)
HCT: 31 % — ABNORMAL LOW (ref 36.0–46.0)
Hemoglobin: 10.5 g/dL — ABNORMAL LOW (ref 12.0–15.0)
O2 Saturation: 100 %
Patient temperature: 35.3
Potassium: 3.2 mmol/L — ABNORMAL LOW (ref 3.5–5.1)
Sodium: 141 mmol/L (ref 135–145)
TCO2: 30 mmol/L (ref 22–32)
pCO2 arterial: 33.7 mmHg (ref 32.0–48.0)
pH, Arterial: 7.532 — ABNORMAL HIGH (ref 7.350–7.450)
pO2, Arterial: 555 mmHg — ABNORMAL HIGH (ref 83.0–108.0)

## 2021-01-22 LAB — CREATININE, SERUM
Creatinine, Ser: 0.51 mg/dL (ref 0.44–1.00)
GFR, Estimated: >60 mL/min (ref >60)

## 2021-01-22 LAB — MRSA NEXT GEN BY PCR, NASAL: MRSA by PCR Next Gen: NOT DETECTED

## 2021-01-22 LAB — CBC
HCT: 32.5 % — ABNORMAL LOW (ref 36.0–46.0)
Hemoglobin: 10.5 g/dL — ABNORMAL LOW (ref 12.0–15.0)
MCH: 30.1 pg (ref 26.0–34.0)
MCHC: 32.3 g/dL (ref 30.0–36.0)
MCV: 93.1 fL (ref 80.0–100.0)
Platelets: 189 10*3/uL (ref 150–400)
RBC: 3.49 MIL/uL — ABNORMAL LOW (ref 3.87–5.11)
RDW: 13 % (ref 11.5–15.5)
WBC: 6.4 10*3/uL (ref 4.0–10.5)
nRBC: 0 % (ref 0.0–0.2)

## 2021-01-22 SURGERY — CRANIOTOMY TUMOR EXCISION
Anesthesia: General | Site: Head

## 2021-01-22 MED ORDER — CHLORHEXIDINE GLUCONATE CLOTH 2 % EX PADS: 6.0000 | MEDICATED_PAD | Freq: Once | CUTANEOUS | Status: DC

## 2021-01-22 MED ORDER — BACITRACIN ZINC 500 UNIT/GM EX OINT
TOPICAL_OINTMENT | CUTANEOUS | Status: AC
  Filled 2021-01-22: qty 28.35

## 2021-01-22 MED ORDER — BUPIVACAINE-EPINEPHRINE (PF) 0.5% -1:200000 IJ SOLN
INTRAMUSCULAR | Status: DC | PRN
  Administered 2021-01-22: 5 mL via PERINEURAL

## 2021-01-22 MED ORDER — FENTANYL CITRATE (PF) 250 MCG/5ML IJ SOLN
INTRAMUSCULAR | Status: DC | PRN
  Administered 2021-01-22 (×2): 25 ug via INTRAVENOUS
  Administered 2021-01-22: 100 ug via INTRAVENOUS
  Administered 2021-01-22 (×4): 25 ug via INTRAVENOUS

## 2021-01-22 MED ORDER — DOCUSATE SODIUM 100 MG PO CAPS
100.0000 mg | ORAL_CAPSULE | Freq: Two times a day (BID) | ORAL | Status: DC
  Administered 2021-01-22 – 2021-01-23 (×2): 100 mg via ORAL
  Filled 2021-01-22 (×2): qty 1

## 2021-01-22 MED ORDER — ACETAMINOPHEN 325 MG PO TABS
650.0000 mg | ORAL_TABLET | ORAL | Status: DC | PRN
  Administered 2021-01-23 (×3): 650 mg via ORAL
  Filled 2021-01-22 (×3): qty 2

## 2021-01-22 MED ORDER — LIDOCAINE-EPINEPHRINE 1 %-1:100000 IJ SOLN
INTRAMUSCULAR | Status: DC | PRN
  Administered 2021-01-22: 5 mL

## 2021-01-22 MED ORDER — LEVETIRACETAM IN NACL 1000 MG/100ML IV SOLN
1000.0000 mg | Freq: Once | INTRAVENOUS | Status: AC
Start: 2021-01-22 — End: 2021-01-22
  Administered 2021-01-22: 1000 mg via INTRAVENOUS
  Filled 2021-01-22: qty 100

## 2021-01-22 MED ORDER — LEVETIRACETAM IN NACL 1000 MG/100ML IV SOLN
1000.0000 mg | Freq: Two times a day (BID) | INTRAVENOUS | Status: DC
  Administered 2021-01-22 – 2021-01-23 (×2): 1000 mg via INTRAVENOUS
  Filled 2021-01-22 (×2): qty 100

## 2021-01-22 MED ORDER — DEXAMETHASONE SODIUM PHOSPHATE 10 MG/ML IJ SOLN
INTRAMUSCULAR | Status: DC | PRN
  Administered 2021-01-22: 10 mg via INTRAVENOUS

## 2021-01-22 MED ORDER — BACITRACIN ZINC 500 UNIT/GM EX OINT
TOPICAL_OINTMENT | CUTANEOUS | Status: DC | PRN
  Administered 2021-01-22: 1 via TOPICAL

## 2021-01-22 MED ORDER — LABETALOL HCL 5 MG/ML IV SOLN
10.0000 mg | INTRAVENOUS | Status: DC | PRN
  Filled 2021-01-22: qty 4

## 2021-01-22 MED ORDER — PROPOFOL 10 MG/ML IV BOLUS
INTRAVENOUS | Status: AC
  Filled 2021-01-22: qty 20

## 2021-01-22 MED ORDER — PHENYLEPHRINE 40 MCG/ML (10ML) SYRINGE FOR IV PUSH (FOR BLOOD PRESSURE SUPPORT)
PREFILLED_SYRINGE | INTRAVENOUS | Status: DC | PRN
  Administered 2021-01-22: 40 ug via INTRAVENOUS

## 2021-01-22 MED ORDER — VANCOMYCIN HCL 750 MG/150ML IV SOLN
750.0000 mg | Freq: Once | INTRAVENOUS | Status: DC
  Filled 2021-01-22: qty 150

## 2021-01-22 MED ORDER — CHLORHEXIDINE GLUCONATE 0.12 % MT SOLN
15.0000 mL | Freq: Once | OROMUCOSAL | Status: AC
  Administered 2021-01-22: 15 mL via OROMUCOSAL
  Filled 2021-01-22: qty 15

## 2021-01-22 MED ORDER — MICROFIBRILLAR COLL HEMOSTAT EX PADS
MEDICATED_PAD | CUTANEOUS | Status: DC | PRN
  Administered 2021-01-22: 1 via TOPICAL

## 2021-01-22 MED ORDER — ONDANSETRON HCL 4 MG/2ML IJ SOLN
4.0000 mg | INTRAMUSCULAR | Status: DC | PRN
  Administered 2021-01-22 (×2): 4 mg via INTRAVENOUS
  Filled 2021-01-22 (×2): qty 2

## 2021-01-22 MED ORDER — HEPARIN SODIUM (PORCINE) 5000 UNIT/ML IJ SOLN
5000.0000 [IU] | Freq: Two times a day (BID) | INTRAMUSCULAR | Status: DC
  Filled 2021-01-22: qty 1

## 2021-01-22 MED ORDER — THROMBIN 5000 UNITS EX SOLR
OROMUCOSAL | Status: DC | PRN
  Administered 2021-01-22: 5 mL via TOPICAL

## 2021-01-22 MED ORDER — GABAPENTIN 300 MG PO CAPS
300.0000 mg | ORAL_CAPSULE | Freq: Two times a day (BID) | ORAL | Status: DC
  Administered 2021-01-22 – 2021-01-23 (×2): 300 mg via ORAL
  Filled 2021-01-22 (×2): qty 1

## 2021-01-22 MED ORDER — LIDOCAINE 2% (20 MG/ML) 5 ML SYRINGE
INTRAMUSCULAR | Status: AC
  Filled 2021-01-22: qty 5

## 2021-01-22 MED ORDER — EPHEDRINE SULFATE-NACL 50-0.9 MG/10ML-% IV SOSY
PREFILLED_SYRINGE | INTRAVENOUS | Status: DC | PRN
  Administered 2021-01-22 (×2): 2.5 mg via INTRAVENOUS

## 2021-01-22 MED ORDER — SODIUM CHLORIDE 0.9 % IV SOLN: INTRAVENOUS | Status: DC

## 2021-01-22 MED ORDER — CHLORHEXIDINE GLUCONATE CLOTH 2 % EX PADS
6.0000 | MEDICATED_PAD | Freq: Every day | CUTANEOUS | Status: DC
  Administered 2021-01-22 – 2021-01-23 (×2): 6 via TOPICAL

## 2021-01-22 MED ORDER — SUGAMMADEX SODIUM 200 MG/2ML IV SOLN
INTRAVENOUS | Status: DC | PRN
  Administered 2021-01-22: 200 mg via INTRAVENOUS

## 2021-01-22 MED ORDER — MANNITOL 25 % IV SOLN
INTRAVENOUS | Status: DC | PRN
  Administered 2021-01-22: 25 g via INTRAVENOUS

## 2021-01-22 MED ORDER — CLEVIDIPINE BUTYRATE 0.5 MG/ML IV EMUL
INTRAVENOUS | Status: AC
  Filled 2021-01-22: qty 50

## 2021-01-22 MED ORDER — ONDANSETRON HCL 4 MG PO TABS: 4.0000 mg | ORAL_TABLET | ORAL | Status: DC | PRN

## 2021-01-22 MED ORDER — PANTOPRAZOLE SODIUM 40 MG IV SOLR
40.0000 mg | Freq: Every day | INTRAVENOUS | Status: DC
  Administered 2021-01-22: 40 mg via INTRAVENOUS
  Filled 2021-01-22: qty 40

## 2021-01-22 MED ORDER — ONDANSETRON HCL 4 MG/2ML IJ SOLN
INTRAMUSCULAR | Status: DC | PRN
  Administered 2021-01-22: 4 mg via INTRAVENOUS

## 2021-01-22 MED ORDER — VANCOMYCIN HCL IN DEXTROSE 1-5 GM/200ML-% IV SOLN
1000.0000 mg | INTRAVENOUS | Status: AC
  Administered 2021-01-22: 1000 mg via INTRAVENOUS
  Filled 2021-01-22: qty 200

## 2021-01-22 MED ORDER — PROPOFOL 500 MG/50ML IV EMUL
INTRAVENOUS | Status: DC | PRN
  Administered 2021-01-22: 50 ug/kg/min via INTRAVENOUS

## 2021-01-22 MED ORDER — ROCURONIUM BROMIDE 10 MG/ML (PF) SYRINGE
PREFILLED_SYRINGE | INTRAVENOUS | Status: AC
  Filled 2021-01-22: qty 10

## 2021-01-22 MED ORDER — LIDOCAINE 2% (20 MG/ML) 5 ML SYRINGE
INTRAMUSCULAR | Status: DC | PRN
  Administered 2021-01-22: 60 mg via INTRAVENOUS

## 2021-01-22 MED ORDER — ROCURONIUM BROMIDE 10 MG/ML (PF) SYRINGE
PREFILLED_SYRINGE | INTRAVENOUS | Status: DC | PRN
  Administered 2021-01-22: 50 mg via INTRAVENOUS
  Administered 2021-01-22 (×2): 20 mg via INTRAVENOUS

## 2021-01-22 MED ORDER — ACETAMINOPHEN 650 MG RE SUPP: 650.0000 mg | RECTAL | Status: DC | PRN

## 2021-01-22 MED ORDER — PHENYLEPHRINE HCL-NACL 20-0.9 MG/250ML-% IV SOLN
INTRAVENOUS | Status: DC | PRN
  Administered 2021-01-22: 40 ug/min via INTRAVENOUS

## 2021-01-22 MED ORDER — CLEVIDIPINE BUTYRATE 0.5 MG/ML IV EMUL: INTRAVENOUS | Status: DC | PRN

## 2021-01-22 MED ORDER — ONDANSETRON HCL 4 MG/2ML IJ SOLN
INTRAMUSCULAR | Status: AC
  Filled 2021-01-22: qty 2

## 2021-01-22 MED ORDER — THROMBIN 5000 UNITS EX SOLR
CUTANEOUS | Status: AC
  Filled 2021-01-22: qty 5000

## 2021-01-22 MED ORDER — 0.9 % SODIUM CHLORIDE (POUR BTL) OPTIME
TOPICAL | Status: DC | PRN
  Administered 2021-01-22: 2000 mL

## 2021-01-22 MED ORDER — BUPIVACAINE-EPINEPHRINE 0.5% -1:200000 IJ SOLN
INTRAMUSCULAR | Status: AC
  Filled 2021-01-22: qty 1

## 2021-01-22 MED ORDER — THROMBIN 20000 UNITS EX KIT
PACK | CUTANEOUS | Status: DC | PRN
  Administered 2021-01-22: 20 mL via TOPICAL

## 2021-01-22 MED ORDER — PROPOFOL 10 MG/ML IV BOLUS
INTRAVENOUS | Status: DC | PRN
  Administered 2021-01-22: 100 mg via INTRAVENOUS
  Administered 2021-01-22: 20 mg via INTRAVENOUS
  Administered 2021-01-22: 30 mg via INTRAVENOUS

## 2021-01-22 MED ORDER — PROMETHAZINE HCL 25 MG PO TABS: 12.5000 mg | ORAL_TABLET | ORAL | Status: DC | PRN

## 2021-01-22 MED ORDER — HYDROCODONE-ACETAMINOPHEN 5-325 MG PO TABS: 1.0000 | ORAL_TABLET | ORAL | Status: DC | PRN

## 2021-01-22 MED ORDER — DEXAMETHASONE SODIUM PHOSPHATE 10 MG/ML IJ SOLN
INTRAMUSCULAR | Status: AC
  Filled 2021-01-22: qty 1

## 2021-01-22 MED ORDER — DEXAMETHASONE SODIUM PHOSPHATE 4 MG/ML IJ SOLN
4.0000 mg | Freq: Four times a day (QID) | INTRAMUSCULAR | Status: DC
  Administered 2021-01-22 – 2021-01-23 (×4): 4 mg via INTRAVENOUS
  Filled 2021-01-22 (×5): qty 1

## 2021-01-22 MED ORDER — LIDOCAINE-EPINEPHRINE 1 %-1:100000 IJ SOLN
INTRAMUSCULAR | Status: AC
  Filled 2021-01-22: qty 1

## 2021-01-22 MED ORDER — MORPHINE SULFATE (PF) 2 MG/ML IV SOLN
1.0000 mg | INTRAVENOUS | Status: DC | PRN
  Administered 2021-01-22 (×2): 2 mg via INTRAVENOUS
  Filled 2021-01-22 (×2): qty 1

## 2021-01-22 MED ORDER — GABAPENTIN 300 MG PO CAPS
600.0000 mg | ORAL_CAPSULE | Freq: Every morning | ORAL | Status: DC
  Administered 2021-01-23: 600 mg via ORAL
  Filled 2021-01-22: qty 2

## 2021-01-22 MED ORDER — FENTANYL CITRATE (PF) 250 MCG/5ML IJ SOLN
INTRAMUSCULAR | Status: AC
  Filled 2021-01-22: qty 5

## 2021-01-22 MED ORDER — LORAZEPAM 2 MG/ML IJ SOLN
4.0000 mg | Freq: Once | INTRAMUSCULAR | Status: DC | PRN
  Filled 2021-01-22: qty 2

## 2021-01-22 MED ORDER — SODIUM CHLORIDE 0.9 % IV SOLN: INTRAVENOUS | Status: DC | PRN

## 2021-01-22 MED ORDER — THROMBIN 20000 UNITS EX SOLR
CUTANEOUS | Status: AC
  Filled 2021-01-22: qty 20000

## 2021-01-22 MED ORDER — ORAL CARE MOUTH RINSE: 15.0000 mL | Freq: Once | OROMUCOSAL | Status: AC

## 2021-01-22 MED ORDER — SODIUM CHLORIDE 0.9 % IV SOLN
0.0500 ug/kg/min | Freq: Once | INTRAVENOUS | Status: AC
  Administered 2021-01-22: .2 ug/kg/min via INTRAVENOUS
  Filled 2021-01-22: qty 5000

## 2021-01-22 MED ORDER — LABETALOL HCL 5 MG/ML IV SOLN
INTRAVENOUS | Status: DC | PRN
  Administered 2021-01-22 (×2): 5 mg via INTRAVENOUS
  Administered 2021-01-22: 10 mg via INTRAVENOUS

## 2021-01-22 SURGICAL SUPPLY — 99 items
BAG COUNTER SPONGE SURGICOUNT (BAG) ×2 IMPLANT
BAG SPNG CNTER NS LX DISP (BAG) ×1
BAND INSRT 18 STRL LF DISP RB (MISCELLANEOUS) ×2
BAND RUBBER #18 3X1/16 STRL (MISCELLANEOUS) ×4 IMPLANT
BIT DRILL WIRE PASS 1.3MM (BIT) IMPLANT
BLADE CLIPPER SURG (BLADE) ×2 IMPLANT
BUR CARBIDE MATCH 3.0 (BURR) ×2 IMPLANT
BUR SPIRAL ROUTER 2.3 (BUR) ×2 IMPLANT
CANISTER SUCT 3000ML PPV (MISCELLANEOUS) ×2 IMPLANT
CARTRIDGE OIL MAESTRO DRILL (MISCELLANEOUS) ×1 IMPLANT
COVER BURR HOLE 14 (Orthopedic Implant) ×2 IMPLANT
DIFFUSER DRILL AIR PNEUMATIC (MISCELLANEOUS) ×1 IMPLANT
DRAIN JACKSON PRATT 1/4 1325 (MISCELLANEOUS) IMPLANT
DRAIN JACKSON RD 7FR 3/32 (WOUND CARE) IMPLANT
DRAPE MICROSCOPE LEICA (MISCELLANEOUS) ×2 IMPLANT
DRAPE NEUROLOGICAL W/INCISE (DRAPES) ×2 IMPLANT
DRAPE SHEET LG 3/4 BI-LAMINATE (DRAPES) ×2 IMPLANT
DRAPE SURG 17X23 STRL (DRAPES) IMPLANT
DRAPE WARM FLUID 44X44 (DRAPES) ×2 IMPLANT
DRILL WIRE PASS 1.3MM (BIT)
DRSG TELFA 3X8 NADH (GAUZE/BANDAGES/DRESSINGS) ×2 IMPLANT
DURAPREP 6ML APPLICATOR 50/CS (WOUND CARE) ×2 IMPLANT
ELECT COATED BLADE 2.86 ST (ELECTRODE) ×2 IMPLANT
ELECT REM PT RETURN 9FT ADLT (ELECTROSURGICAL) ×2
ELECTRODE REM PT RTRN 9FT ADLT (ELECTROSURGICAL) ×1 IMPLANT
EVACUATOR SILICONE 100CC (DRAIN) IMPLANT
FEE INTRAOP CADWELL SUPPLY NCS (MISCELLANEOUS) IMPLANT
FEE INTRAOP MONITOR IMPULS NCS (MISCELLANEOUS) IMPLANT
FORCEPS BIPO MALIS IRRIG 9X1.5 (NEUROSURGERY SUPPLIES) ×2 IMPLANT
FORCEPS BIPOLAR SPETZLER 8 1.0 (NEUROSURGERY SUPPLIES) ×1 IMPLANT
GAUZE 4X4 16PLY ~~LOC~~+RFID DBL (SPONGE) ×2 IMPLANT
GAUZE SPONGE 4X4 12PLY STRL (GAUZE/BANDAGES/DRESSINGS) IMPLANT
GLOVE EXAM NITRILE XL STR (GLOVE) IMPLANT
GLOVE SRG 8 PF TXTR STRL LF DI (GLOVE) ×2 IMPLANT
GLOVE SURG LTX SZ8 (GLOVE) ×4 IMPLANT
GLOVE SURG UNDER POLY LF SZ8 (GLOVE) ×4
GOWN STRL REUS W/ TWL LRG LVL3 (GOWN DISPOSABLE) IMPLANT
GOWN STRL REUS W/ TWL XL LVL3 (GOWN DISPOSABLE) ×1 IMPLANT
GOWN STRL REUS W/TWL 2XL LVL3 (GOWN DISPOSABLE) ×1 IMPLANT
GOWN STRL REUS W/TWL LRG LVL3 (GOWN DISPOSABLE) ×2
GOWN STRL REUS W/TWL XL LVL3 (GOWN DISPOSABLE) ×2
GRAFT DURAGEN MATRIX 2WX2L ×1 IMPLANT
HEMOSTAT POWDER KIT SURGIFOAM (HEMOSTASIS) ×2 IMPLANT
HEMOSTAT SNOW SURGICEL 2X4 (HEMOSTASIS) IMPLANT
HEMOSTAT SURGICEL 2X14 (HEMOSTASIS) ×2 IMPLANT
HEMOSTAT SURGICEL 2X4 FIBR (HEMOSTASIS) ×1 IMPLANT
HOOK DURA 1/2IN (MISCELLANEOUS) IMPLANT
HOOK RETRACTION 12 ELAST STAY (MISCELLANEOUS) ×3 IMPLANT
INTRAOP CADWELL SUPPLY FEE NCS (MISCELLANEOUS) ×1
INTRAOP DISP SUPPLY FEE NCS (MISCELLANEOUS) ×2
INTRAOP MONITOR FEE IMPULS NCS (MISCELLANEOUS) ×1
INTRAOP MONITOR FEE IMPULSE (MISCELLANEOUS) ×2
IV NS 1000ML (IV SOLUTION)
IV NS 1000ML BAXH (IV SOLUTION) ×1 IMPLANT
KIT BASIN OR (CUSTOM PROCEDURE TRAY) ×2 IMPLANT
KIT TURNOVER KIT B (KITS) ×2 IMPLANT
MARKER SPHERE PSV REFLC 13MM (MARKER) ×5 IMPLANT
NEEDLE HYPO 22GX1.5 SAFETY (NEEDLE) ×2 IMPLANT
NS IRRIG 1000ML POUR BTL (IV SOLUTION) ×3 IMPLANT
OIL CARTRIDGE MAESTRO DRILL (MISCELLANEOUS)
PACK CRANIOTOMY CUSTOM (CUSTOM PROCEDURE TRAY) ×2 IMPLANT
PAD DRESSING TELFA 3X8 NADH (GAUZE/BANDAGES/DRESSINGS) IMPLANT
PATTIES SURGICAL .5 X.5 (GAUZE/BANDAGES/DRESSINGS) IMPLANT
PATTIES SURGICAL .5 X3 (DISPOSABLE) IMPLANT
PATTIES SURGICAL 1X1 (DISPOSABLE) IMPLANT
PERFORATOR LRG  14-11MM (BIT) ×2
PERFORATOR LRG 14-11MM (BIT) ×1 IMPLANT
PIN MAYFIELD SKULL DISP (PIN) ×2 IMPLANT
PLATE UNIV CMF 16 2H (Plate) ×2 IMPLANT
RETRACTOR LONE STAR DISPOSABLE (INSTRUMENTS) ×4 IMPLANT
SCREW UNIII AXS SD 1.5X4 (Screw) ×13 IMPLANT
SET CARTRIDGE AND TUBING (SET/KITS/TRAYS/PACK) IMPLANT
SET TUBING IRRIGATION DISP (TUBING) ×2 IMPLANT
SPONGE NEURO XRAY DETECT 1X3 (DISPOSABLE) IMPLANT
SPONGE SURGIFOAM ABS GEL 100 (HEMOSTASIS) ×2 IMPLANT
SPONGE T-LAP 4X18 ~~LOC~~+RFID (SPONGE) IMPLANT
STAPLER VISISTAT 35W (STAPLE) ×3 IMPLANT
STOCKINETTE 6  STRL (DRAPES) ×2
STOCKINETTE 6 STRL (DRAPES) ×1 IMPLANT
STRIP CLOSURE SKIN 1/2X4 (GAUZE/BANDAGES/DRESSINGS) ×1 IMPLANT
STRIP PLATINUM NCS 1X4 (MISCELLANEOUS) ×4
STRIP PLATINUM NCS 1X4 NCS (MISCELLANEOUS) IMPLANT
SUT ETHILON 3 0 FSL (SUTURE) IMPLANT
SUT ETHILON 3 0 PS 1 (SUTURE) IMPLANT
SUT NURALON 4 0 TR CR/8 (SUTURE) ×6 IMPLANT
SUT VIC AB 0 CT1 18XCR BRD8 (SUTURE) ×1 IMPLANT
SUT VIC AB 0 CT1 8-18 (SUTURE) ×2
SUT VIC AB 2-0 CP2 18 (SUTURE) ×2 IMPLANT
SUT VICRYL RAPIDE 4/0 PS 2 (SUTURE) ×2 IMPLANT
TIP SHEAR CVD EXTENDED 36KH (INSTRUMENTS) IMPLANT
TOWEL GREEN STERILE (TOWEL DISPOSABLE) ×2 IMPLANT
TOWEL GREEN STERILE FF (TOWEL DISPOSABLE) ×2 IMPLANT
TRAP SPECIMEN MUCUS 40CC (MISCELLANEOUS) ×1 IMPLANT
TRAY FOLEY MTR SLVR 14FR STAT (SET/KITS/TRAYS/PACK) ×1 IMPLANT
TRAY FOLEY MTR SLVR 16FR STAT (SET/KITS/TRAYS/PACK) ×1 IMPLANT
TUBE CONNECTING 12X1/4 (SUCTIONS) ×2 IMPLANT
UNDERPAD 30X36 HEAVY ABSORB (UNDERPADS AND DIAPERS) ×2 IMPLANT
WATER STERILE IRR 1000ML POUR (IV SOLUTION) ×2 IMPLANT
WRENCH TORQUE 36KHZ (INSTRUMENTS) IMPLANT

## 2021-01-22 NOTE — Op Note (Signed)
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 Pieter Partridge C. Adriena Manfre, DO  ASSISTANT: Dr. Consuella Lose, MD  ANESTHESIA: General Endotracheal  EBL: 50 cc  SPECIMENS: Right frontal tumor  DRAINS: None  COMPLICATIONS: None  CONDITION: Hemodynamically stable  HISTORY: [name] [name] is a 70 y.o. female with a history of DCIS of breast cancer, peritoneal cancer treated in April 2021, that presented to the emergency department with focal facial seizures.  Imaging revealed multiple intracranial lesions, primarily a sizable 3 cm right frontal lesion with surrounding mass-effect in the central sulcus.  This was near the homunculus region for the face in which she was having left facial seizures.  Given [name] multiple different cancer history and concerned that this could be peritoneal versus breast, surgical resection was recommended as well as the fact that she had continued seizures.  We discussed all risks, benefits and expected outcomes including but not limited to heart attack, stroke, death, seizures, infection, bleeding, need for more surgery and more specifically possible worsening facial weakness or arm weakness on the left.  Informed consent was obtained with [name] and [name] [name].  PROCEDURE IN DETAIL: The patient was brought to the operating room. After induction of general anesthesia, the patient's head was fixed with the Mayfield head holder to the bed.  The patient was positioned on the operative table in the supine position. All pressure points were  meticulously padded.  Using BrainLab stereotaxy, the patient's MRI was registered and confirmed to have excellent accuracy.  The posterior right frontal region was clipped free of hair.  Skin incision was then marked out and prepped and draped in the usual sterile fashion using neuro navigation.  Physician driven timeout was performed.  Local anesthetic was injected into the planned incision.  Confirmation of accuracy of the registration was performed before incision.  Using a 10 blade, incision was performed down to the pericranium and temporalis fascia.  Raney clips were applied.  The temporalis fascia was incised with a 10 blade.  Periosteal elevator was used for dissection anteriorly and posteriorly to elevate the pericranium and temporalis.  Raney clips were applied to the skin edges.  Fishhooks were placed for retraction.  The craniotomy was then planned with the neuro navigation.  A craniotomy was performed using a high-speed drill and elevated in normal fashion.  Epidural hemostasis was achieved with bipolar cautery and Surgifoam.  The microscope was sterilely draped and brought into the field for the remainder the procedure.  The dura was opened with a 15 blade and Metzenbaum scissors in a curvilinear fashion with the pedicle inferior.  This was retracted inferiorly with a 4-0 nylon suture.  There was some cortical abnormality noted that coordinated with the most superficial region of the tumor on neuro navigation.  A 4-lead cortical lead was placed over the region of interest.  Confirmation of phase reversal was noted in the tumor location was in the precentral gyrus.  This was performed again and again phase reversal was confirmed and the area of interest was the precentral gyrus.  The lead was removed.  Using bipolar cautery the very anterior portion of the precentral gyrus was  coagulated and cut with a 15 blade.  Using blunt dissection with a Penfield 1 the anterior region of the cyst cavity and  tumor wall was carefully dissected from white matter.  The cyst was then aspirated and yellowish fluid was saved and sent for permanent pathology.  Using a Penfield 1 and tumor forceps, the remainder of the tumor wall was gently dissected from the white matter.  Using bipolar cautery the vascular attachments  coagulated and cut circumferentially. This was performed inferiorly, laterally and medially and superiorly.  The posterior region of the tumor along the pia and arachnoid of the central sulcus was more involved.  This was gently dissected with suction and micro curettes.  The tumor wall and capsule was sent for permanent pathology.   I remained subarachnoid posteriorly along the precentral gyrus and use gentle micro curettes and suction technique to not violate the pia of the posterior portion of the precentral sulcus.  There was obvious grayish tumor involvement at this region.  This was gently removed with suction technique.  Hemostasis was achieved with Surgifoam and bipolar cautery.  The resection cavity was extensively evaluated and there was no obvious residual tumor noted.  The wound and resection cavity was copiously irrigated and noted to be excellently hemostatic.  The resection cavity was lined with Surgicel.  Again the resection cavity was excellently hemostatic.  The dura was closed with 4-0 interrupted Nurolon sutures.  Epidural hemostasis was achieved with Surgifoam.  A small piece of DuraGen was laid over the durotomy site.  The bone flap was affixed to its original position with the cranial plating system.  Retractors were removed.  Raney clips were removed.  Hemostasis was achieved with bipolar cautery.  The temporalis fascia was closed with 2-0 Vicryl sutures.  Briant Cedar was closed with 2-0 Vicryl sutures.  Skin was closed with staples.  Sterile dressing was applied.  At the end of the case all sponge, needle, and instrument counts were correct. The patient was then transferred to the  stretcher, Mayfield head holder was removed, the patient was extubated, and taken to the post-anesthesia care unit in stable hemodynamic condition.

## 2021-01-22 NOTE — Progress Notes (Addendum)
Pharmacy Antibiotic Note  [name] [name] is a 70 y.o. female admitted on 01/22/2021 for R frontal craniotomy and resection of tumor.  Pharmacy has been consulted for vancomycin dosing for surgical prophylaxis x 24 hrs. Vancomycin 1000mg  given at ~1000 today.   Plan: Vancomycin 750mg  x1   Height: 5\' 5"  (165.1 cm) Weight: 44 kg (97 lb) IBW/kg (Calculated) : 57  Temp (24hrs), Avg:97.7 F (36.5 C), Min:97.6 F (36.4 C), Max:97.8 F (36.6 C)  Recent Labs  Lab 01/22/21 1630  WBC 6.4    CrCl cannot be calculated (Patient's most recent lab result is older than the maximum 21 days allowed.).    Allergies  Allergen Reactions   Fentanyl Palpitations    Palpitations , fever ,headahe, N/V  " loud pounding heart beat"    Lac Bovis Diarrhea and Nausea And Vomiting   Brimonidine Tartrate     Other reaction(s): blurred vision   Ditropan [Oxybutynin]    Milk-Related Compounds Diarrhea and Nausea And Vomiting   Other Other (See Comments)   Oxybutynin Chloride Other (See Comments)    Ditropan  Pt doesn't remember    Thimerosal Other (See Comments)    Pt doesn't remember   Tramadol Other (See Comments)    Pt doesn't remember   Avelox [Moxifloxacin Hcl In Nacl] Rash   Doxycycline Rash   Erythromycin Rash   Metronidazole Rash   Minocycline Rash   Penicillins Rash    Did it involve swelling of the face/tongue/throat, SOB, or low BP? No Did it involve sudden or severe rash/hives, skin peeling, or any reaction on the inside of your mouth or nose? No Did you need to seek medical attention at a hospital or doctor's office? No When did it last happen?      Childhood allergy If all above answers are "NO", may proceed with cephalosporin use.    Quinolones Rash   Sulfonamide Derivatives Rash    Antimicrobials this admission:   Dose adjustments this admission:   Microbiology results:   Thank you for allowing pharmacy to be a part of this patient's care.  Cristela Felt, PharmD,  BCPS Clinical Pharmacist 01/22/2021 5:09 PM

## 2021-01-22 NOTE — Progress Notes (Signed)
   Providing Compassionate, Quality Care - Together  NEUROSURGERY PROGRESS NOTE   S: pt s/e in pacu  O: EXAM:  BP 124/65 (BP Location: Right Arm)   Pulse 73   Temp 97.6 F (36.4 C)   Resp 10   Ht 5\' 5"  (1.651 m)   Wt 44 kg   SpO2 99%   BMI 16.14 kg/m   Awake, confused PERRL EOMI Slight L facial droop, stable MAE equally Dressing c/d/i  ASSESSMENT:  70 y.o. female with   Multiple intracranial mets  -s/p R frontal crani for tumor on 01/22/2021  PLAN: - icu - pain control - pt/ot - mri tomorrow - family updated    Thank you for allowing me to participate in this patient's care.  Please do not hesitate to call with questions or concerns.   Elwin Sleight, [facility] Cell: [phone]

## 2021-01-22 NOTE — Anesthesia Procedure Notes (Signed)
Procedure Name: Intubation Date/Time: 01/22/2021 1:08 PM Performed by: Ardyth Harps, CRNA Pre-anesthesia Checklist: Patient identified, Emergency Drugs available, Suction available and Patient being monitored Patient Re-evaluated:Patient Re-evaluated prior to induction Oxygen Delivery Method: Circle System Utilized Preoxygenation: Pre-oxygenation with 100% oxygen Induction Type: IV induction Ventilation: Mask ventilation without difficulty Laryngoscope Size: Mac and 3 Grade View: Grade I Tube type: Oral Tube size: 7.0 mm Number of attempts: 1 Airway Equipment and Method: Stylet Placement Confirmation: ETT inserted through vocal cords under direct vision, positive ETCO2 and breath sounds checked- equal and bilateral Secured at: 21 cm Tube secured with: Tape Dental Injury: Teeth and Oropharynx as per pre-operative assessment

## 2021-01-22 NOTE — Transfer of Care (Signed)
Immediate Anesthesia Transfer of Care Note  Patient: [name] [name]  Procedure(s) Performed: STEREOTACTIC RIGHT FRONTAL CRANIOTOMY FOR RESECTION OF TUMOR (Head) APPLICATION OF CRANIAL NAVIGATION (Head)  Patient Location: PACU  Anesthesia Type:General  Level of Consciousness: awake, alert  and oriented  Airway & Oxygen Therapy: Patient Spontanous Breathing  Post-op Assessment: Report given to RN and Post -op Vital signs reviewed and stable  Post vital signs: Reviewed and stable  Last Vitals:  Vitals Value Taken Time  BP 127/66 01/22/21 1613  Temp    Pulse 69 01/22/21 1613  Resp 17 01/22/21 1613  SpO2 97 % 01/22/21 1613  Vitals shown include unvalidated device data.  Last Pain:  Vitals:   01/22/21 1106  TempSrc:   PainSc: 0-No pain         Complications: No notable events documented.

## 2021-01-22 NOTE — Progress Notes (Signed)
Mechanical soft diet order placed per patient request for history of dysphagia. SLP consult placed as well.  Montez Hageman RN

## 2021-01-22 NOTE — Anesthesia Procedure Notes (Signed)
Arterial Line Insertion Start/End11/10/2020 11:00 AM Performed by: Janene Harvey, CRNA, CRNA  Patient location: Pre-op. Preanesthetic checklist: patient identified and risks and benefits discussed Lidocaine 1% used for infiltration Left, radial was placed Catheter size: 20 G Hand hygiene performed  and maximum sterile barriers used  Allen's test indicative of satisfactory collateral circulation Attempts: 1 Procedure performed without using ultrasound guided technique. Following insertion, dressing applied and Biopatch. Post procedure assessment: unchanged  Patient tolerated the procedure well with no immediate complications.

## 2021-01-22 NOTE — H&P (Signed)
Providing Compassionate, Quality Care - Together  NEUROSURGERY HISTORY & PHYSICAL   [name] [name] is an 70 y.o. female.   Chief Complaint: Right frontal tumor HPI: This is a right-handed 70 year old female with a history of breast cancer status postlumpectomy and radiation (DCIS), peritoneal cancer treated with surgical resection and chemotherapy in April 2021 presents for right frontal craniotomy for resection of tumor.  She was found to have 2 focal seizures of the left face went to the ED and was found to have multiple intracranial masses, with the largest and most concerning in the right frontal region just anterior to the motor strip along the facial region.  She states over the weekend a few days ago she had progressive focal seizures and was increased on her Keppra 1000 twice daily.  She also complains of slight facial drooping on the left primarily along her nasolabial fold and eye.  She denies any further seizures today.  She denies any focal weakness otherwise.   Past Medical History:  Diagnosis Date   Allergic rhinitis    Cervical dysplasia 1980   Cervicalgia    Eczema    Rosacea,dermatitis-Dr Stinehelfer   Esophageal reflux    Family history of colon cancer    Family history of melanoma    Family history of ovarian cancer    Family history of pancreatic cancer    Family history of prostate cancer    Family history of uterine cancer    Fibromyalgia    GERD (gastroesophageal reflux disease)    Glaucoma, narrow-angle    Heart murmur    Hiatal hernia    Irritable bowel syndrome    LBBB (left bundle branch block)    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), unspecified site    DCIS   Mass of lung    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 Arlyce Dice   Mitral valve disorders(424.0)    No antibiotics required   Osteoporosis 12/2016   T score -2.3, 2014 T score -2.5 AP spine   PAC (premature atrial contraction) 2012   ,PVC's, and nonsustained atril tachycardia w aberration  by heart monitor    PVC (premature ventricular contraction)    Rosacea    Stricture and stenosis of esophagus     Past Surgical History:  Procedure Laterality Date   BREAST LUMPECTOMY  1998   right breast with radiation   CERVICAL DISCECTOMY     C3-7 with bone graft, in three different surgeries, have titanium plate and screws   GYNECOLOGIC CRYOSURGERY  1980   IR IMAGING GUIDED PORT INSERTION  12/24/2018   NECK SURGERY  1993-1997   bone graft and fusion   OMENTECTOMY  02/17/2019   REFRACTIVE SURGERY     narrow angle glucoma   RHINOPLASTY  1976   sterotactic large core needle biopsy right breast Right 10/15/1996   TUBAL LIGATION  1977    Family History  Problem Relation Age of Onset   Heart disease Mother    Depression Mother    Hypertension Mother    Heart disease Father    Hypertension Father    Aneurysm Father    Uterine cancer Sister 26   Hypertension Sister    Ovarian cancer Sister 42   Melanoma Sister 67   Depression Sister    Melanoma Sister 4   Heart disease Paternal Grandfather    Stroke Maternal Grandmother    Cancer Maternal Grandfather 63       Pancreatic   Stroke Paternal Grandmother  Cancer Paternal Uncle        multiple myeloma, prostate cancer and melanoma (separate primaries)   Colon cancer Neg Hx    Social History:  reports that she has never smoked. She has never used smokeless tobacco. She reports current alcohol use of about 2.0 standard drinks per week. She reports that she does not use drugs.  Allergies:  Allergies  Allergen Reactions   Fentanyl Palpitations    Palpitations , fever ,headahe, N/V  " loud pounding heart beat"    Lac Bovis Diarrhea and Nausea And Vomiting   Brimonidine Tartrate     Other reaction(s): blurred vision   Ditropan [Oxybutynin]    Milk-Related Compounds Diarrhea and Nausea And Vomiting   Other Other (See Comments)   Oxybutynin Chloride Other (See Comments)    Ditropan  Pt doesn't remember    Thimerosal  Other (See Comments)    Pt doesn't remember   Tramadol Other (See Comments)    Pt doesn't remember   Avelox [Moxifloxacin Hcl In Nacl] Rash   Doxycycline Rash   Erythromycin Rash   Metronidazole Rash   Minocycline Rash   Penicillins Rash    Did it involve swelling of the face/tongue/throat, SOB, or low BP? No Did it involve sudden or severe rash/hives, skin peeling, or any reaction on the inside of your mouth or nose? No Did you need to seek medical attention at a hospital or doctor's office? No When did it last happen?      Childhood allergy If all above answers are "NO", may proceed with cephalosporin use.    Quinolones Rash   Sulfonamide Derivatives Rash    Medications Prior to Admission  Medication Sig Dispense Refill   Azelaic Acid 15 % cream Apply 1 application topically daily.     calcium carbonate (OS-CAL - DOSED IN MG OF ELEMENTAL CALCIUM) 1250 (500 Ca) MG tablet Take 1 tablet by mouth daily with breakfast.     cholecalciferol (VITAMIN D3) 25 MCG (1000 UNIT) tablet Take 2,000 Units by mouth daily.     dexamethasone (DECADRON) 4 MG tablet Take 1 tablet (4 mg total) by mouth daily. 30 tablet 1   gabapentin (NEURONTIN) 300 MG capsule Take 300-600 mg by mouth See admin instructions. Taking 600 mg in am ;300 mg at lunch; 300 mg at hs.     levETIRAcetam (KEPPRA) 500 MG tablet Take 1 tablet (500 mg total) by mouth 2 (two) times daily. 60 tablet 0   lidocaine-prilocaine (EMLA) cream APPLY TOPICALLY AS NEEDED (PORT ACCESS). 30 g 3   Magnesium 250 MG TABS Take 250 mg by mouth daily.     Probiotic Product (PROBIOTIC DAILY PO) Take 1 capsule by mouth daily.     Sulfacetamide Sodium, Acne, 10 % LOTN Apply 1 application topically at bedtime.     VITAMIN E PO Take 50 Int'l Units/day by mouth daily.     cromolyn (NASALCROM) 5.2 MG/ACT nasal spray Place 1 spray into the nose 3 (three) times daily as needed for allergies.     risedronate (ACTONEL) 35 MG tablet Take 35 mg by mouth every 7  (seven) days. with water on empty stomach, nothing by mouth or lie down for next 30 minutes.      No results found for this or any previous visit (from the past 48 hour(s)). No results found.  ROS All positives and negatives are listed in HPI above  Blood pressure 140/62, pulse 77, temperature 97.8 F (36.6 C), temperature source Oral, resp.  rate 16, height 5' 5" (1.651 m), weight 44 kg, SpO2 100 %. Physical Exam  Awake alert oriented x3, no acute distress PERRLA EOMI Slight left facial droop, otherwise cranial nerves II through XII intact Bilateral upper extremity/lower extremity 5/5 Sensory intact light touch  Assessment/Plan 70 year old female with  Metastatic brain tumor, concern for peritoneal cancer metastasis  -OR today for right frontal craniotomy, resection of tumor with phase reversal.  We discussed all risks, benefits and expected outcomes.  I discussed the risk of further weakness of her face or possibly her arm given the location and edema on her MRI.  She agrees and understands that this is possible.  Answered all of her questions.   Thank you for allowing me to participate in this patient's care.  Please do not hesitate to call with questions or concerns.   Elwin Sleight, [facility] Cell: [phone]

## 2021-01-23 ENCOUNTER — Inpatient Hospital Stay (HOSPITAL_COMMUNITY): Payer: Medicare Other

## 2021-01-23 ENCOUNTER — Encounter (HOSPITAL_COMMUNITY): Payer: Self-pay | Admitting: Neurological Surgery

## 2021-01-23 LAB — CYTOLOGY - NON PAP

## 2021-01-23 MED ORDER — GADOBUTROL 1 MMOL/ML IV SOLN
4.5000 mL | Freq: Once | INTRAVENOUS | Status: AC | PRN
  Administered 2021-01-23: 4.5 mL via INTRAVENOUS

## 2021-01-23 MED ORDER — LEVETIRACETAM 500 MG PO TABS: 500.0000 mg | ORAL_TABLET | Freq: Two times a day (BID) | ORAL | 1 refills | Status: DC

## 2021-01-23 MED ORDER — DEXAMETHASONE 4 MG PO TABS: 4.0000 mg | ORAL_TABLET | Freq: Two times a day (BID) | ORAL | 0 refills | Status: DC

## 2021-01-23 MED ORDER — KATE FARMS STANDARD 1.4 PO LIQD
325.0000 mL | Freq: Two times a day (BID) | ORAL | Status: DC
  Administered 2021-01-23: 325 mL via ORAL
  Filled 2021-01-23: qty 325

## 2021-01-23 MED ORDER — CEFAZOLIN SODIUM-DEXTROSE 1-4 GM/50ML-% IV SOLN
1.0000 g | Freq: Three times a day (TID) | INTRAVENOUS | Status: AC
  Administered 2021-01-23 (×2): 1 g via INTRAVENOUS
  Filled 2021-01-23 (×2): qty 50

## 2021-01-23 NOTE — Discharge Instructions (Addendum)
Consider adding Dillard [place] shakes to your daily routine to increase calories and protein to preserve and build lean body mass.  Follow u p with [place] Dietitian: Dory Peru, [phone] Craniotomy Care After Please read the instructions outlined below and refer to this sheet in the next few weeks. These discharge instructions provide you with general information on caring for yourself after you leave the hospital. Your surgeon may also give you specific instructions. While your treatment has been planned according to the most current medical practices available, unavoidable complications occasionally occur. If you have any problems or questions after discharge, please call your surgeon. Although there are many types of brain surgery, recovery following craniotomy (surgical opening of the skull) is much the same for each. However, recovery depends on many factors. These include the type and severity of brain injury and the type of surgery. It also depends on any nervous system function problems (neurological deficits) before surgery. If the craniotomy was done for cancer, chemotherapy and radiation could follow. You could be in the hospital from 5 days to a couple weeks. This depends on the type of surgery, findings, and whether there are complications. HOME CARE INSTRUCTIONS  It is not unusual to hear a clicking noise after a craniotomy, the plates and screws used to attach the bone flap can sometimes cause this. It is a normal occurrence if this does happen Do not drive for 10 days after the operation Your scalp may feel spongy for a while, because of fluid under it. This will gradually get better. Occasionally, the surgeon will not replace the bone that was removed to access the brain. If there is a bony defect, the surgeon will ask you to wear a helmet for protection. This is a discussion you should have with your surgeon prior to leaving the hospital (discharge). Numbness may  persist in some areas of your scalp. Take all medications as directed. Sometimes steroids to control swelling are prescribed. Anticonvulsants to prevent seizures may also be given. Do not use alcohol, other drugs, or medications unless your surgeon says it is OK. Keep the wound dry and clean. The wound may be washed gently with soap and water. Then, you may gently blot or dab it dry, without rubbing. Do not take baths, use swimming pools or hot tubs for 10 days, or as instructed by your caregiver. It is best to wait to see you surgeon at your first postoperative visit, and to get directions at that time. Only take over-the-counter or prescription medicines for pain, discomfort, or fever as directed by your caregiver. You may continue your normal diet, as directed. Walking is OK for exercise. Wait at least 3 months before you return to mild, non-contact sports or as your surgeon suggests. Contact sports should be avoided for at least 1 year, unless your surgeon says it is OK. If you are prescribed steroids, take them exactly as prescribed. If you start having a decrease in nervous system functions (neurological deficits) and headaches as the dose of steroids is reduced, tell your surgeon right away. When the anticonvulsant prescription is finished you no longer need to take it. SEEK IMMEDIATE MEDICAL CARE IF:  You develop nausea, vomiting, severe headaches, confusion, or you have a seizure. You develop chest pain, a stiff neck, or difficulty breathing. There is redness, swelling, or increasing pain in the wound or pin insertion sites. You have an increase in swelling or bruising around the eyes. There is drainage or pus coming from  the wound. You have an oral temperature above 102 F (38.9 C), not controlled by medicine. You notice a foul smell coming from the wound or dressing. The wound breaks open (edges not staying together) after the stitches have been removed. You develop dizziness or fainting  while standing. You develop a rash. You develop any reaction or side effects to the medications given. Document Released: 06/03/2005 Document Revised: 05/26/2011 Document Reviewed: 03/12/2009 [facility] Patient Information 2013 [place].

## 2021-01-23 NOTE — Evaluation (Signed)
Physical Therapy Evaluation Patient Details Name: [name] [name] MRN: [id number] DOB: 19-May-1950 Today's Date: 01/23/2021  History of Present Illness  70 y/o female s/p R frontal craniotomy for resection of metastatic brain tumor on 11/8. PMH: breast cancer s/p postlumpectomy and radiation, peritoneal cancer, GERD, LBBB, seizure, IBS  Clinical Impression  PTA, patient lives with husband and reports independence. Patient presents with impaired balance, decreased activity tolerance, generalized weakness. Patient demonstrates good safety awareness with reporting new balance deficits. Patient requires minA for ambulation with no AD. Patient demos posterior listing with static standing balance during conversation. Patient will benefit from skilled PT services during acute stay to address listed deficits. Recommend OPPT following discharge to address balance deficits.        Recommendations for follow up therapy are one component of a multi-disciplinary discharge planning process, led by the attending physician.  Recommendations may be updated based on patient status, additional functional criteria and insurance authorization.  Follow Up Recommendations Outpatient PT    Assistance Recommended at Discharge Intermittent Supervision/Assistance  Functional Status Assessment Patient has had a recent decline in their functional status and demonstrates the ability to make significant improvements in function in a reasonable and predictable amount of time.  Equipment Recommendations  None recommended by PT    Recommendations for Other Services       Precautions / Restrictions Precautions Precautions: Fall Restrictions Weight Bearing Restrictions: No      Mobility  Bed Mobility Overal bed mobility: Modified Independent                  Transfers Overall transfer level: Needs assistance Equipment used: 1 person hand held assist Transfers: Sit to/from Stand Sit to Stand: Min guard            General transfer comment: able to power up but requries Hand held (A) for balance    Ambulation/Gait Ambulation/Gait assistance: Min assist;Min guard Gait Distance (Feet): 250 Feet Assistive device: None Gait Pattern/deviations: Step-through pattern;Decreased stride length;Staggering left;Drifts right/left;Wide base of support Gait velocity: decreased     General Gait Details: drifting L/R throughout and initially requiring minA for balance dur to staggering towards L. With increasing distance, patient required min guard.  Stairs Stairs: Yes Stairs assistance: Min guard Stair Management: One rail Right;Step to pattern;Forwards Number of Stairs: 6 General stair comments: grasping rail with two hands and required min guard  Wheelchair Mobility    Modified Rankin (Stroke Patients Only)       Balance Overall balance assessment: Needs assistance Sitting-balance support: Bilateral upper extremity supported;Feet supported Sitting balance-Leahy Scale: Fair     Standing balance support: Single extremity supported;During functional activity Standing balance-Leahy Scale: Poor                               Pertinent Vitals/Pain Pain Assessment: Faces Pain Score: 8  Faces Pain Scale: Hurts little more Pain Location: head Pain Descriptors / Indicators: Discomfort;Aching;Headache Pain Intervention(s): Limited activity within patient's tolerance;Monitored during session    Home Living Family/patient expects to be discharged to:: Private residence Living Arrangements: Spouse/significant other Available Help at Discharge: Family;Available PRN/intermittently (initially 24 hours for 5-6 days) Type of Home: House Home Access: Stairs to enter Entrance Stairs-Rails: Right Entrance Stairs-Number of Steps: 6 Alternate Level Stairs-Number of Steps: 6 Home Layout: Two level (basement) Home Equipment: Shower seat - built Medical sales representative (2 wheels) Additional  Comments: spouse and sister can (A) upon d/c as  needed.    Prior Function Prior Level of Function : Independent/Modified Independent               ADLs Comments: completed laundry down steps     Hand Dominance   Dominant Hand: Right    Extremity/Trunk Assessment   Upper Extremity Assessment Upper Extremity Assessment: Defer to OT evaluation    Lower Extremity Assessment Lower Extremity Assessment: Overall WFL for tasks assessed    Cervical / Trunk Assessment Cervical / Trunk Assessment: Normal  Communication   Communication: No difficulties  Cognition Arousal/Alertness: Awake/alert Behavior During Therapy: WFL for tasks assessed/performed Overall Cognitive Status: Within Functional Limits for tasks assessed                                 General Comments: pt verbalized awareness to balance changes . pt and spouse educated on fall risk Functional Status Assessment: Patient has not had a recent decline in their functional status      General Comments General comments (skin integrity, edema, etc.): VSS    Exercises     Assessment/Plan    PT Assessment Patient needs continued PT services  PT Problem List Decreased strength;Decreased balance;Decreased mobility;Decreased coordination       PT Treatment Interventions DME instruction;Gait training;Stair training;Therapeutic exercise;Functional mobility training;Therapeutic activities;Balance training;Patient/family education    PT Goals (Current goals can be found in the Care Plan section)  Acute Rehab PT Goals Patient Stated Goal: to go home PT Goal Formulation: With patient Time For Goal Achievement: 02/06/21 Potential to Achieve Goals: Good    Frequency Min 3X/week   Barriers to discharge        Co-evaluation               AM-PAC PT "6 Clicks" Mobility  Outcome Measure Help needed turning from your back to your side while in a flat bed without using bedrails?: None Help needed  moving from lying on your back to sitting on the side of a flat bed without using bedrails?: None Help needed moving to and from a bed to a chair (including a wheelchair)?: A Little Help needed standing up from a chair using your arms (e.g., wheelchair or bedside chair)?: A Little Help needed to walk in hospital room?: A Little Help needed climbing 3-5 steps with a railing? : A Little 6 Click Score: 20    End of Session Equipment Utilized During Treatment: Gait belt Activity Tolerance: Patient tolerated treatment well Patient left: in bed;with call bell/phone within reach;with family/visitor present Nurse Communication: Mobility status PT Visit Diagnosis: Unsteadiness on feet (R26.81);Muscle weakness (generalized) (M62.81)    Time: 2641-5830 PT Time Calculation (min) (ACUTE ONLY): 21 min   Charges:   PT Evaluation $PT Eval Moderate Complexity: 1 Mod          Sigismund Cross A. Gilford Rile PT, DPT Acute Rehabilitation Services Pager [phone] Office [phone]   Linna Hoff 01/23/2021, 11:38 AM

## 2021-01-23 NOTE — Progress Notes (Addendum)
Initial Nutrition Assessment  DOCUMENTATION CODES:   Severe malnutrition in context of chronic illness, Underweight  INTERVENTION:   Educated on importance of adequate nutrition  Referred pt to outpatient [facility] for follow up; name/number documented in AVS.   Dillard Essex ordered BID; each provide: 455 kcal and 20 grams protein   NUTRITION DIAGNOSIS:   Severe Malnutrition related to chronic illness (cancer) as evidenced by severe fat depletion, severe muscle depletion.  GOAL:   Patient will meet greater than or equal to 90% of their needs  MONITOR:   PO intake, Supplement acceptance  REASON FOR ASSESSMENT:   Malnutrition Screening Tool    ASSESSMENT:   Pt with PMH of breast cancer s/p lumpectomy and XRT 1998, newly dx with peritoneal cancer in 11/2018 s/p surgical resection and chemotherapy 06/2019 who is now admitted after seizures with dx of multiple intercranial mets s/p tumor resection 11/8.   Pt discussed during ICU rounds and with RN.   Spoke with pt and her husband who is at bedside. She reports that she has never seen a RD before but has a family member who gives her a lot of advice but recognizes that she may have misinformation. Pt and her husband attempt to follow a very healthy diet and avoid gluten, dairy, and artifical sweeteners. She is concerned that sugar feeds cancer. We discussed nutrient metabolism and worked to [employer]. She is agreeable at this time to try [facility] even though it does have agave sweetener.   She reports her 24 hr recall:  Breakfast: 2 cups of Silk 10g protein almond/cashew milk Lunch: 2 poached eggs over spinach or hummus with nuts  Dinner: fish or chicken and vegetables   Pt reports weight hx of 101 lb after chemo, now down to 95 lb. 6% weight loss  Per pt she had issues swallowing pre-admission. SLP to completed MBS today (ok for Dysphagia 3 with thin liquids) and possible d/c later this evening.    11/8 s/p  craniotomy for tumor resection; pathology pending  Medications reviewed and include: decadron, colace, gabapentin, protonix Labs reviewed: K 3.2 CBG: 128  NUTRITION - FOCUSED PHYSICAL EXAM:  Flowsheet Row Most Recent Value  Orbital Region Severe depletion  Upper Arm Region Severe depletion  Thoracic and Lumbar Region Severe depletion  Buccal Region Severe depletion  Temple Region Severe depletion  Clavicle Bone Region Severe depletion  Clavicle and Acromion Bone Region Severe depletion  Scapular Bone Region Severe depletion  Dorsal Hand Severe depletion  Patellar Region Severe depletion  Anterior Thigh Region Severe depletion  Posterior Calf Region Severe depletion  Edema (RD Assessment) None  Hair Reviewed  Eyes Reviewed  Mouth Reviewed  Skin Reviewed  Nails Reviewed       Diet Order:   Diet Order             DIET DYS 3 Room service appropriate? Yes; Fluid consistency: Thin  Diet effective now                   EDUCATION NEEDS:   Education needs have been addressed  Skin:  Skin Assessment: Reviewed RN Assessment (head incision)  Last BM:  unknown  Height:   Ht Readings from Last 1 Encounters:  01/22/21 5\' 5"  (1.651 m)    Weight:   Wt Readings from Last 1 Encounters:  01/22/21 44 kg    BMI:  Body mass index is 16.14 kg/m.  Estimated Nutritional Needs:   Kcal:  1600-1800  Protein:  70-85 grams  Fluid:  >1.6 L/day  Lockie Pares., RD, LDN, CNSC See AMiON for contact information

## 2021-01-23 NOTE — Discharge Summary (Signed)
Physician Discharge Summary  Patient ID: [name] [name] MRN: [id number] DOB/AGE: 72/05/1950 72 y.o.  Admit date: 01/22/2021 Discharge date: 01/23/2021  Admission Diagnoses: 1. 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 History of peritoneal cancer (2021) 3. History of breast cancer (DCIS) Discharge Diagnoses:  1. 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 History of peritoneal cancer (2021) 3. History of breast cancer (DCIS) Active Problems:   Brain lesion   Discharged Condition: good  Hospital Course: The patient was admitted on 01/23/2021 and taken to the operating room where the patient underwent a right frontal craniotomy for resection of tumor. The patient tolerated the procedure well and was taken to the recovery room and then to the floor in stable condition. The hospital course was routine. There were no complications. The wound remained clean dry and intact. Pt had incisional pain. No complaints new neurological symptoms. The patient remained afebrile with stable vital signs, and tolerated a regular diet. The patient continued to increase activities, and pain was well controlled with oral pain medications.   Consults: None  Significant Diagnostic Studies: radiology: MRI: brain with and without contrast  Treatments: surgery:  1. Stereotactic right frontal craniotomy for resection of tumor 2. Intraoperative use of stereotaxy, BrainLab 3. Intraoperative use of neuro monitoring, phase reversal 4. Intraoperative use of microscope for microdissection  Discharge Exam: Blood pressure 106/63, pulse 80, temperature 98.2 F (36.8 C), temperature source Oral, resp. rate 16, height 5\' 5"  (1.651 m), weight 44 kg, SpO2 99 %.   Disposition: Discharge disposition: 01-Home or Self Care      Physical Exam: Patient is awake, A/O X 4, conversant, and in good spirits. They are in NAD and VSS. Doing well. Speech is fluent and appropriate. MAEW with good strength  that is symmetric bilaterally. 5/5 BUE/BLE. Sensation to light touch is intact. PERLA, EOMI. Slight left facial asymmetry that has improved following surgery. Dressing is clean dry intact. Incision is well approximated with no drainage, erythema, or edema.    Allergies as of 01/23/2021       Reactions   Fentanyl Palpitations   Palpitations , fever ,headahe, N/V  " loud pounding heart beat"    Lac Bovis Diarrhea, Nausea And Vomiting   Brimonidine Tartrate    Other reaction(s): blurred vision   Ditropan [oxybutynin]    Milk-related Compounds Diarrhea, Nausea And Vomiting   Other Other (See Comments)   Oxybutynin Chloride Other (See Comments)   Ditropan  Pt doesn't remember   Thimerosal Other (See Comments)   Pt doesn't remember   Tramadol Other (See Comments)   Pt doesn't remember   Avelox [moxifloxacin Hcl In Nacl] Rash   Doxycycline Rash   Erythromycin Rash   Metronidazole Rash   Minocycline Rash   Penicillins Rash   Did it involve swelling of the face/tongue/throat, SOB, or low BP? No Did it involve sudden or severe rash/hives, skin peeling, or any reaction on the inside of your mouth or nose? No Did you need to seek medical attention at a hospital or doctor's office? No When did it last happen?      Childhood allergy If all above answers are "NO", may proceed with cephalosporin use.   Quinolones Rash   Sulfonamide Derivatives Rash        Medication List     TAKE these medications    Azelaic Acid 15 % gel Apply 1 application topically daily.   calcium carbonate 1250 (500 Ca) MG tablet Commonly known as: OS-CAL -  dosed in mg of elemental calcium Take 1 tablet by mouth daily with breakfast.   cholecalciferol 25 MCG (1000 UNIT) tablet Commonly known as: VITAMIN D3 Take 2,000 Units by mouth daily.   cromolyn 5.2 MG/ACT nasal spray Commonly known as: NASALCROM Place 1 spray into the nose 3 (three) times daily as needed for allergies.   dexamethasone 4 MG  tablet Commonly known as: DECADRON Take 1 tablet (4 mg total) by mouth daily. What changed: Another medication with the same name was added. Make sure you understand how and when to take each.   dexamethasone 4 MG tablet Commonly known as: DECADRON Take 1 tablet (4 mg total) by mouth 2 (two) times daily with a meal. Take one tablet three times per day for two days, then two tablets per day for two days, then one tablet per day for two days What changed: You were already taking a medication with the same name, and this prescription was added. Make sure you understand how and when to take each.   gabapentin 300 MG capsule Commonly known as: NEURONTIN Take 300-600 mg by mouth See admin instructions. Taking 600 mg in am ;300 mg at lunch; 300 mg at hs.   levETIRAcetam 500 MG tablet Commonly known as: KEPPRA Take 1 tablet (500 mg total) by mouth 2 (two) times daily. What changed: Another medication with the same name was added. Make sure you understand how and when to take each.   levETIRAcetam 500 MG tablet Commonly known as: Keppra Take 1 tablet (500 mg total) by mouth every 12 (twelve) hours. What changed: You were already taking a medication with the same name, and this prescription was added. Make sure you understand how and when to take each.   lidocaine-prilocaine cream Commonly known as: EMLA APPLY TOPICALLY AS NEEDED (PORT ACCESS).   Magnesium 250 MG Tabs Take 250 mg by mouth daily.   PROBIOTIC DAILY PO Take 1 capsule by mouth daily.   risedronate 35 MG tablet Commonly known as: ACTONEL Take 35 mg by mouth every 7 (seven) days. with water on empty stomach, nothing by mouth or lie down for next 30 minutes.   Sulfacetamide Sodium (Acne) 10 % Lotn Apply 1 application topically at bedtime.   VITAMIN E PO Take 50 Int'l Units/day by mouth daily.        Follow-up Information     Dawley, Troy C, DO Follow up in 1 week(s).   Why: please call the office to make an  appointment to have your staples removed Contact information: [address] [place] [address] [phone]                 Signed: Marvis Moeller, [place], NP-C 01/23/2021, 5:12 PM

## 2021-01-23 NOTE — Evaluation (Addendum)
Clinical/Bedside Swallow Evaluation Patient Details  Name: [name] [name] MRN: [id number] Date of Birth: 09-Oct-1950  Today's Date: 01/23/2021 Time: SLP Start Time (ACUTE ONLY): 7 SLP Stop Time (ACUTE ONLY): 1004 SLP Time Calculation (min) (ACUTE ONLY): 24 min  Past Medical History:  Past Medical History:  Diagnosis Date   Allergic rhinitis    Cervical dysplasia 1980   Cervicalgia    Eczema    Rosacea,dermatitis-Dr Stinehelfer   Esophageal reflux    Family history of colon cancer    Family history of melanoma    Family history of ovarian cancer    Family history of pancreatic cancer    Family history of prostate cancer    Family history of uterine cancer    Fibromyalgia    GERD (gastroesophageal reflux disease)    Glaucoma, narrow-angle    Heart murmur    Hiatal hernia    Irritable bowel syndrome    LBBB (left bundle branch block)    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), unspecified site    DCIS   Mass of lung    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 Arlyce Dice   Mitral valve disorders(424.0)    No antibiotics required   Osteoporosis 12/2016   T score -2.3, 2014 T score -2.5 AP spine   PAC (premature atrial contraction) 2012   ,PVC's, and nonsustained atril tachycardia w aberration by heart monitor    PVC (premature ventricular contraction)    Rosacea    Stricture and stenosis of esophagus    Past Surgical History:  Past Surgical History:  Procedure Laterality Date   APPLICATION OF CRANIAL NAVIGATION N/A 01/22/2021   Procedure: APPLICATION OF CRANIAL NAVIGATION;  Surgeon: Karsten Ro, DO;  Location: [place];  Service: Neurosurgery;  Laterality: N/A;   BREAST LUMPECTOMY  1998   right breast with radiation   CERVICAL DISCECTOMY     C3-7 with bone graft, in three different surgeries, have titanium plate and screws   CRANIOTOMY N/A 01/22/2021   Procedure: STEREOTACTIC RIGHT FRONTAL CRANIOTOMY FOR RESECTION OF TUMOR;  Surgeon: Karsten Ro, DO;  Location: [place];   Service: Neurosurgery;  Laterality: N/A;   GYNECOLOGIC CRYOSURGERY  1980   IR IMAGING GUIDED PORT INSERTION  12/24/2018   NECK SURGERY  1993-1997   bone graft and fusion   OMENTECTOMY  02/17/2019   REFRACTIVE SURGERY     narrow angle glucoma   RHINOPLASTY  1976   sterotactic large core needle biopsy right breast Right 10/15/1996   TUBAL LIGATION  1977   HPI:  70 y.o. F with PMHx significant for breast cancer s/p lumpectomy and radiation (1998), peritoneal cancer treatment with surgical resection and chemotherapy (2021) presents for right frontal craniotomy for resection of tumor after presenting to ED with 2 focal seizures of the left face revealing multiple intracranial masses, largest in the right frontal region anterior to motor strip.    Assessment / Plan / Recommendation  Clinical Impression  Pt was seen for a bedside swallow evaluation. Pt reports chronic increasing difficulty with swallowing after cervical disc fusions and further decline in swallowing after current hospitalization. Oral mechanism exam revealed dry oral cavity, mild left facial asymmetry and weakness along with bilateral lingual weakness, worse on left side with adequate natural dentition. SLP trialed thin liquid from , puree, and Dys3 solids. Pt oral phase intact. Pharyngeally, pt exhibited immediate throat clear with thin liquid from cup x1 and reports sticking and globus sensation with solids. Recommend continue with Dys3/thin liquid  diet. SLP will follow with MBS later this date to objectively view suspected residue and trial compensatory strategies. SLP Visit Diagnosis: Dysphagia, unspecified (R13.10)    Aspiration Risk  Mild aspiration risk    Diet Recommendation Dys3;Thin liquid   Liquid Administration via: Straw;Cup Medication Administration: Whole meds with puree Supervision: Patient able to self feed Compensations: Multiple dry swallows after each bite/sip Postural Changes: Seated upright at 90 degrees     Other  Recommendations Oral Care Recommendations: Oral care BID    Recommendations for follow up therapy are one component of a multi-disciplinary discharge planning process, led by the attending physician.  Recommendations may be updated based on patient status, additional functional criteria and insurance authorization.  Follow up Recommendations Other (comment) (tbd)      Assistance Recommended at Discharge Other (comment) (tbd)  Functional Status Assessment Patient has had a recent decline in their functional status and demonstrates the ability to make significant improvements in function in a reasonable and predictable amount of time.  Frequency and Duration min 2x/week  2 weeks       Prognosis Prognosis for Safe Diet Advancement: Good      Swallow Study   General Date of Onset: 01/22/21 HPI: 70 y.o. F with PMHx significant for breast cancer s/p lumpectomy and radiation (1998), peritoneal cancer treatment with surgical resection and chemotherapy (2021) presents for right frontal craniotomy for resection of tumor after presenting to ED with 2 focal seizures of the left face revealing multiple intracranial masses, largest in the right frontal region anterior to motor strip. Type of Study: Bedside Swallow Evaluation Previous Swallow Assessment: esophagram (revealing stricture treated in 2008 and dismotility) Diet Prior to this Study: Thin liquids;Dysphagia 3 (soft) Temperature Spikes Noted: No Respiratory Status: Room air History of Recent Intubation: Yes Length of Intubations (days): 1 days Date extubated: 01/22/21 Behavior/Cognition: Alert;Cooperative;Pleasant mood Oral Cavity Assessment: Dry Oral Care Completed by SLP: No Oral Cavity - Dentition: Adequate natural dentition Vision: Functional for self-feeding Self-Feeding Abilities: Able to feed self Patient Positioning: Upright in bed Baseline Vocal Quality: Normal Volitional Cough: Strong Volitional Swallow: Able to  elicit    Oral/Motor/Sensory Function Overall Oral Motor/Sensory Function: Mild impairment Facial ROM: Reduced left Facial Symmetry: Abnormal symmetry left Facial Strength: Reduced left Lingual ROM: Within Functional Limits Lingual Symmetry: Within Functional Limits Lingual Strength: Reduced (more significant on left side) Velum: Within Functional Limits Mandible: Impaired   Ice Chips Ice chips: Not tested   Thin Liquid Thin Liquid: Impaired Presentation: Cup;Straw;Self Fed Pharyngeal  Phase Impairments: Throat Clearing - Immediate    Nectar Thick Nectar Thick Liquid: Not tested   Honey Thick Honey Thick Liquid: Not tested   Puree Puree: Impaired Presentation: Spoon Pharyngeal Phase Impairments: Multiple swallows;Other (comments) (complaints of globus sensation)   Solid   Dewitt Rota, SLP-Student  Solid: Impaired Pharyngeal Phase Impairments: Multiple swallows (complaints of globus sensation)      Dewitt Rota 01/23/2021,10:17 AM

## 2021-01-23 NOTE — Progress Notes (Signed)
Patient expressed concerns about getting vancomycin IV based on allergy history to multiple antibiotics. Patient also mentioned that she received it in the ER and she developed severe itching. Concerned for a worse reaction if we give it again. I will hold the vancomycin for now and speak to Dr Dawley about it when he rounds on the patient this AM. All other questions and concerns addressed.  Montez Hageman RN

## 2021-01-23 NOTE — Final Progress Note (Signed)
Patient discharged at 1645. AVS including medications gone over with patient and patiet's husband and all questions answered. All IV's removed.   Last vitals:  98.2 oral 106/63  80 HR 16 RR 99%% on RA.  Patient taken down in wheelchair by NT.   Montez Hageman RN

## 2021-01-23 NOTE — Progress Notes (Signed)
Subjective: Patient reports that she is doing well. She has appropriate incisional pain. No acute events overnight.   Objective: Vital signs in last 24 hours: Temp:  [97.6 F (36.4 C)-98.2 F (36.8 C)] 97.7 F (36.5 C) (11/09 0400) Pulse Rate:  [61-91] 75 (11/09 0700) Resp:  [10-27] 19 (11/09 0700) BP: (101-148)/(46-77) 116/62 (11/09 0700) SpO2:  [97 %-100 %] 98 % (11/09 0700) Arterial Line BP: (123-175)/(36-66) 157/64 (11/09 0500) Weight:  [44 kg] 44 kg (11/08 0911)  Intake/Output from previous day: 11/08 0701 - 11/09 0700 In: 1836.4 [P.O.:320; I.V.:1316.4; IV Piggyback:200] Out: 1400 [Urine:1300; Blood:100] Intake/Output this shift: No intake/output data recorded.  Physical Exam: Patient is awake, A/O X 4, conversant, and in good spirits. They are in NAD and VSS. Doing well. Speech is fluent and appropriate. MAEW with good strength that is symmetric bilaterally. 5/5 BUE/BLE. Sensation to light touch is intact. PERLA, EOMI. Slight left facial asymmetry that has improved following surgery. Dressing is clean dry intact. Incision is well approximated with no drainage, erythema, or edema.   Lab Results: Recent Labs    01/22/21 1339 01/22/21 1630  WBC  --  6.4  HGB 10.5* 10.5*  HCT 31.0* 32.5*  PLT  --  189   BMET Recent Labs    01/22/21 1339 01/22/21 1630  NA 141  --   K 3.2*  --   CREATININE  --  0.51    Studies/Results: No results found.  Assessment/Plan: Patient is post-op day 1 s/p stereotactic right frontal craniotomy for resection of tumor. She is recovering well. She has appropriate incisional pain. Left-sided facial asymmetry has improved following surgery. No new neurological complaints. Neuro exam is stable. The patient reports moderate urticaria following vancomycin administration yesterday. This morning's dose was held due to her complaints. Plan to d/c vancomycin and order 1g Ancef Q8H X 2 doses. Patient was evaluated by PT and OT who are both recommending  outpatient follow up. SLP evaluation with modified barium swallow, resulted in diagnosis of dysphasia. Recommended dysphagia 3 diet with thin liquids. She is stable for discharge.  Plan for her to follow up in the office in 1 week for evaluation and staple removal.  She will also follow up with Dr. Mickeal Skinner, oncology. We will taper her dexamethasone over the next 5 days to 2 mg b.i.d..  She will continue her Keppra 1 g b.i.d. Plan for Dr. Mickeal Skinner to manage patient's  dexamethasone and Keppra after discharge.   -Remove foley -Remoce art line -1G Ancef Q8H X 2 doses -PT, OT, SLP -awaiting pathology results     LOS: 1 day     Marvis Moeller, DNP, NP-C 01/23/2021, 7:40 AM

## 2021-01-23 NOTE — Progress Notes (Addendum)
Modified Barium Swallow Progress Note  Completed and documented by Oletha Blend SLP Student Supervised and reviewed by Herbie [place] CCC-SLP  Patient Details  Name: [name] [name] MRN: [id number] Date of Birth: 04/16/1950  Today's Date: 01/23/2021  Modified Barium Swallow completed.  Full report located under Chart Review in the Imaging Section.  Brief recommendations include the following:  Clinical Impression  Pt was seen for an MBS. Overall, pt presents with mild pharyngeal dysphagia c/b reduced epiglottic inversion and general muscular weakness resulting in intermittent penetration above the level of the vocal folds that does not clear (PAS 3) with thin liquids and residue on posterior pharynx, vallecula, and pyriform sinuses across all consistencies. Pt epiglottis inverts, however unable to move past posterior pharyngeal wall to fully cover airway. Penetration with thin liquids is trace and cleared spontaneously with all but 2 trials. Pt exhibited reflexive throat clear x1, clearing penetration independently when it did not spontaneously clear. Pharyngeal residue worsens with increasing consistencies, exbiting greatest residue with regular solids. Pt utilizes the multiple swallows and liquid wash strategies independently and frequently expectorates solid residue from vallecula back into oral cavity for reswallow. SLP also trialed effortful swallowing, resulting in reduction of pharyngeal residue. Pt educated re: severity of dysphagia and risk factors for future progression of swallowing impairment if medical complications progress. Recommend Dys3/thin liquid diet, administering medications whole with thin liquid. Crush or cut pills if large. Pt requires no further acute SLP services.   Swallow Evaluation Recommendations       SLP Diet Recommendations: Dysphagia 3 (Mech soft) solids;Thin liquid   Liquid Administration via: Cup;Straw   Medication Administration: Whole meds with liquid  (Crush or cut meds if large)   Supervision: Patient able to self feed   Compensations: Effortful swallow;Multiple dry swallows after each bite/sip;Follow solids with liquid   Postural Changes: Remain semi-upright after after feeds/meals (Comment);Seated upright at 90 degrees   Oral Care Recommendations: Oral care BID        Brianna Esson, Katherene Ponto 01/23/2021,2:59 PM

## 2021-01-23 NOTE — Evaluation (Signed)
Occupational Therapy Evaluation Patient Details Name: [name] [name] MRN: [id number] DOB: 24-Nov-1950 Today's Date: 01/23/2021   History of Present Illness 70 y/o female s/p R frontal craniotomy for resection of metastatic brain tumor on 11/8. PMH: breast cancer s/p postlumpectomy and radiation, peritoneal cancer, GERD, LBBB, seizure, IBS   Clinical Impression   Patient is s/p R craniotomy surgery resulting in functional limitations due to the deficits listed below (see OT problem list). Pt with posterior lean bias at this time. Pt requires cues for anterior weight shift with self awareness to directional LOB. Pt will have spouse and sister (A) upon d/c as needed. Recommendation for balance outpatient and pt guarded as reports previous outpatient therapy was not as successful as pelvic floor sessions.  Patient will benefit from skilled OT acutely to increase independence and safety with ADLS to allow discharge outpatient balance needs.  Recommendation for sitting for adls as much as possible and (A) for basic transfers at this time.       Recommendations for follow up therapy are one component of a multi-disciplinary discharge planning process, led by the attending physician.  Recommendations may be updated based on patient status, additional functional criteria and insurance authorization.   Follow Up Recommendations  Outpatient OT    Assistance Recommended at Discharge    Functional Status Assessment  Patient has had a recent decline in their functional status and demonstrates the ability to make significant improvements in function in a reasonable and predictable amount of time.  Equipment Recommendations  None recommended by OT    Recommendations for Other Services       Precautions / Restrictions Precautions Precautions: Fall Restrictions Weight Bearing Restrictions: No      Mobility Bed Mobility Overal bed mobility: Modified Independent                   Transfers Overall transfer level: Needs assistance Equipment used: 1 person hand held assist Transfers: Sit to/from Stand Sit to Stand: Min guard           General transfer comment: able to power up but requries Hand held (A) for balance      Balance Overall balance assessment: Needs assistance Sitting-balance support: Bilateral upper extremity supported;Feet supported Sitting balance-Leahy Scale: Fair     Standing balance support: Single extremity supported;During functional activity Standing balance-Leahy Scale: Poor                             ADL either performed or assessed with clinical judgement   ADL Overall ADL's : Needs assistance/impaired Eating/Feeding: Independent   Grooming: Minimal assistance;Standing;Wash/dry hands Grooming Details (indicate cue type and reason): posterior LOB               Lower Body Dressing Details (indicate cue type and reason): 70 y/o female s/p R frontal craniotomy for resection of metastatic brain tumor on 11/8. PMH: breast cancer s/p postlumpectomy and radiation, peritoneal cancer, GERD, LBBB, seizure, IBS Toilet Transfer: Minimal assistance;Regular Glass blower/designer Details (indicate cue type and reason): use Grab bars Toileting- Clothing Manipulation and Hygiene: Supervision/safety;Sitting/lateral lean     Tub/Shower Transfer Details (indicate cue type and reason): educated on the need to sit with simulation of shower. pt with posterior LOB. pt states okay i need to sit Functional mobility during ADLs: Minimal assistance       Vision Baseline Vision/History: 1 Wears glasses Ability to See in Adequate Light: 0 Adequate  Perception     Praxis      Pertinent Vitals/Pain Pain Assessment: Faces Pain Score: 8  Faces Pain Scale: Hurts little more Pain Location: head Pain Descriptors / Indicators: Discomfort;Aching;Headache Pain Intervention(s): Limited activity within patient's  tolerance;Monitored during session     Hand Dominance Right   Extremity/Trunk Assessment Upper Extremity Assessment Upper Extremity Assessment: Overall WFL for tasks assessed   Lower Extremity Assessment Lower Extremity Assessment: Defer to PT evaluation   Cervical / Trunk Assessment Cervical / Trunk Assessment: Normal   Communication Communication Communication: No difficulties   Cognition Arousal/Alertness: Awake/alert Behavior During Therapy: WFL for tasks assessed/performed Overall Cognitive Status: Within Functional Limits for tasks assessed                                 General Comments: pt verbalized awareness to balance changes . pt and spouse educated on fall risk     General Comments  VSS    Exercises     Shoulder Instructions      Home Living Family/patient expects to be discharged to:: Private residence Living Arrangements: Spouse/significant other Available Help at Discharge: Family;Available PRN/intermittently (initially 24 hours for 5-6 days) Type of Home: House Home Access: Stairs to enter [employer] of Steps: 6 Entrance Stairs-Rails: Right Home Layout: Two level (basement) Alternate Level Stairs-Number of Steps: 6 Alternate Level Stairs-Rails: Left Bathroom Shower/Tub: Occupational psychologist: Handicapped height Bathroom Accessibility: No   Home Equipment: Shower seat - built Medical sales representative (2 wheels)   Additional Comments: spouse and sister can (A) upon d/c as needed.  Lives With: Spouse    Prior Functioning/Environment Prior Level of Function : Independent/Modified Independent               ADLs Comments: completed laundry down steps        OT Problem List: Decreased activity tolerance;Impaired balance (sitting and/or standing);Decreased safety awareness;Decreased knowledge of use of DME or AE      OT Treatment/Interventions: Self-care/ADL training;Therapeutic exercise;Neuromuscular  education;Energy conservation;DME and/or AE instruction;Therapeutic activities;Cognitive remediation/compensation;Patient/family education;Balance training    OT Goals(Current goals can be found in the care plan section) Acute Rehab OT Goals Patient Stated Goal: to go home OT Goal Formulation: With patient/family Time For Goal Achievement: 02/06/21 Potential to Achieve Goals: Good  OT Frequency: Min 2X/week   Barriers to D/C:            Co-evaluation              AM-PAC OT "6 Clicks" Daily Activity     Outcome Measure Help from another person eating meals?: None Help from another person taking care of personal grooming?: None Help from another person toileting, which includes using toliet, bedpan, or urinal?: A Little Help from another person bathing (including washing, rinsing, drying)?: A Little Help from another person to put on and taking off regular upper body clothing?: None Help from another person to put on and taking off regular lower body clothing?: A Little 6 Click Score: 21   End of Session Equipment Utilized During Treatment: Gait belt Nurse Communication: Mobility status;Precautions  Activity Tolerance: Patient tolerated treatment well Patient left: Other (comment) (up with PT with mobility)  OT Visit Diagnosis: Unsteadiness on feet (R26.81);Muscle weakness (generalized) (M62.81)                Time: 9163-8466 OT Time Calculation (min): 19 min Charges:  OT General Charges $OT  Visit: 1 Visit OT Evaluation $OT Eval Moderate Complexity: 1 Mod   Brynn, OTR/L  Acute Rehabilitation Services Pager: [phone] Office: [phone] .   Jeri Modena 01/23/2021, 11:34 AM

## 2021-01-23 NOTE — Evaluation (Signed)
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   Anemia, chronic disease 05/30/2020   Osteoporosis 05/30/2020   Preventive measure 12/05/2019   Physical debility 06/17/2019   Tachycardia 05/17/2019   Rosacea 05/17/2019   Lesion of lung 05/16/2019   Genetic testing 05/16/2019   Mucositis due to chemotherapy 04/25/2019   Peripheral neuropathy due to chemotherapy ([place]) 03/21/2019   Goals of care, counseling/discussion 03/21/2019   Family history of ovarian cancer    Family history of uterine cancer    Family history of melanoma    Family history of prostate cancer    Family history of pancreatic cancer    Family history of colon cancer    Anemia due to antineoplastic chemotherapy 01/18/2019   Bone pain 12/27/2018   Glaucoma, narrow-angle    Peritoneal carcinoma ([place]) 11/25/2018   LBBB (left bundle branch block) 05/18/2013   Atrial tachycardia, paroxysmal ([place]) 05/18/2013   PVC (premature ventricular contraction)    PAC (premature atrial contraction)    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/2011   IBS (irritable bowel syndrome) 11/12/2010   Bacterial overgrowth syndrome 11/12/2010   GERD (gastroesophageal reflux disease) 11/12/2010   PERIPHERAL NEUROPATHY 04/17/2008   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   ESOPHAGEAL STRICTURE 04/17/2008   Diaphragmatic hernia 04/17/2008   NECK PAIN 04/17/2008   Past Medical History:  Past Medical History:  Diagnosis Date   Allergic rhinitis    Cervical dysplasia 1980   Cervicalgia    Eczema    Rosacea,dermatitis-Dr Stinehelfer   Esophageal reflux    Family history of  colon cancer    Family history of melanoma    Family history of ovarian cancer    Family history of pancreatic cancer    Family history of prostate cancer    Family history of uterine cancer    Fibromyalgia    GERD (gastroesophageal reflux disease)    Glaucoma, narrow-angle    Heart murmur    Hiatal hernia    Irritable bowel syndrome    LBBB (left bundle branch block)    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), unspecified site    DCIS   Mass of lung    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 Arlyce Dice   Mitral valve disorders(424.0)    No antibiotics required   Osteoporosis 12/2016   T score -2.3, 2014 T score -2.5 AP spine   PAC (premature atrial contraction) 2012   ,PVC's, and nonsustained atril tachycardia w aberration by heart monitor    PVC (premature ventricular contraction)    Rosacea    Stricture and stenosis of esophagus    Past Surgical History:  Past Surgical History:  Procedure Laterality Date   APPLICATION OF CRANIAL NAVIGATION N/A 01/22/2021   Procedure: APPLICATION OF CRANIAL NAVIGATION;  Surgeon: Karsten Ro, DO;  Location: [facility];  Service: Neurosurgery;  Laterality: N/A;   BREAST LUMPECTOMY  1998   right breast with radiation   CERVICAL DISCECTOMY     C3-7 with bone graft, in three different surgeries, have titanium plate and screws   CRANIOTOMY N/A 01/22/2021   Procedure: STEREOTACTIC RIGHT FRONTAL CRANIOTOMY FOR RESECTION OF TUMOR;  Surgeon:  Dawley, Theodoro Doing, DO;  Location: [facility];  Service: Neurosurgery;  Laterality: N/A;   GYNECOLOGIC CRYOSURGERY  1980   IR IMAGING GUIDED PORT INSERTION  12/24/2018   NECK SURGERY  1993-1997   bone graft and fusion   OMENTECTOMY  02/17/2019   REFRACTIVE SURGERY     narrow angle glucoma   RHINOPLASTY  1976   sterotactic large core needle biopsy right breast Right 10/15/1996   TUBAL LIGATION  1977   HPI:  70 y.o. F with PMHx significant for breast cancer s/p lumpectomy and radiation (1998), peritoneal cancer treatment with  surgical resection and chemotherapy (2021) presents for right frontal craniotomy for resection of tumor after presenting to ED with 2 focal seizures of the left face revealing multiple intracranial masses, largest in the right frontal region anterior to motor strip.   Assessment / Plan / Recommendation Clinical Impression  Pt was seen for a speech-language evaluation. Overall, pt cognition, speech, and language functional for all tasks assessed. Pt reports mild and slow decline in cognition and language (word finding) beginning in September of 2020 with initiation of chemotherapy and subsequent discovery of brain tumors, however pt received 27/30 on SLUMS examination and exhibited no difficulty with divergent or confrontation naming, reading, writing, or receptive/expressive oral language. Pt exhibited mild deficit in higher level attention to detail during scheduling task, however pt corrected mistakes with one verbal cue. Pt educated re: compensatory and organization strategies to aid intermittent word finding difficulty reports at home. Pt requires no further acute ST for language or cognition. '    SLP Assessment  SLP Recommendation/Assessment: Patient does not need any further Speech Lanaguage Pathology Services SLP Visit Diagnosis: Cognitive communication deficit (R41.841)    Recommendations for follow up therapy are one component of a multi-disciplinary discharge planning process, led by the attending physician.  Recommendations may be updated based on patient status, additional functional criteria and insurance authorization.    Follow Up Recommendations  No SLP follow up    Assistance Recommended at Discharge  None  Functional Status Assessment Patient has not had a recent decline in their functional status  Frequency and Duration min 2x/week         SLP Evaluation Cognition  Overall Cognitive Status: Within Functional Limits for tasks assessed Arousal/Alertness:  Awake/alert Orientation Level: Oriented X4 Year: 2022 Day of Week: Correct Attention: Sustained Sustained Attention: Appears intact Memory: Appears intact Awareness: Appears intact Problem Solving: Appears intact Executive Function: Reasoning;Self Correcting Reasoning: Appears intact Self Correcting: Appears intact Safety/Judgment: Appears intact       Comprehension  Auditory Comprehension Overall Auditory Comprehension: Appears within functional limits for tasks assessed Visual Recognition/Discrimination Discrimination: Within Function Limits Reading Comprehension Reading Status: Within funtional limits    Expression Expression Primary Mode of Expression: Verbal Verbal Expression Overall Verbal Expression: Appears within functional limits for tasks assessed Written Expression Dominant Hand: Right   Oral / Motor  Oral Motor/Sensory Function Overall Oral Motor/Sensory Function: Mild impairment Facial ROM: Reduced left Facial Symmetry: Abnormal symmetry left Facial Strength: Reduced left Lingual ROM: Within Functional Limits Lingual Symmetry: Within Functional Limits Lingual Strength: Reduced (more significant on left side) Velum: Within Functional Limits Mandible: Impaired Motor Speech Overall Motor Speech: Appears within functional limits for tasks assessed   GO                   Dewitt Rota, SLP-Student  Dewitt Rota 01/23/2021, 1:15 PM

## 2021-01-24 ENCOUNTER — Encounter (HOSPITAL_COMMUNITY): Payer: Self-pay | Admitting: Neurological Surgery

## 2021-01-26 NOTE — Anesthesia Postprocedure Evaluation (Signed)
Anesthesia Post Note  Patient: [name] [name]  Procedure(s) Performed: STEREOTACTIC RIGHT FRONTAL CRANIOTOMY FOR RESECTION OF TUMOR (Head) APPLICATION OF CRANIAL NAVIGATION (Head)     Patient location during evaluation: PACU Anesthesia Type: General Level of consciousness: awake and alert Pain management: pain level controlled Vital Signs Assessment: post-procedure vital signs reviewed and stable Respiratory status: spontaneous breathing, nonlabored ventilation, respiratory function stable and patient connected to nasal cannula oxygen Cardiovascular status: blood pressure returned to baseline and stable Postop Assessment: no apparent nausea or vomiting Anesthetic complications: no   No notable events documented.  Last Vitals:  Vitals:   01/23/21 1300 01/23/21 1600  BP: (!) 105/55 106/63  Pulse: 75 80  Resp: 18 16  Temp:  36.8 C  SpO2: 98% 99%    Last Pain:  Vitals:   01/23/21 1600  TempSrc: Oral  PainSc: [place]

## 2021-01-28 ENCOUNTER — Other Ambulatory Visit: Payer: Medicare Other

## 2021-01-28 ENCOUNTER — Inpatient Hospital Stay: Payer: Medicare Other | Attending: Gynecologic Oncology

## 2021-01-28 ENCOUNTER — Telehealth: Payer: Self-pay | Admitting: Oncology

## 2021-01-28 DIAGNOSIS — C7931 Secondary malignant neoplasm of brain: Secondary | ICD-10-CM | POA: Insufficient documentation

## 2021-01-28 DIAGNOSIS — C482 Malignant neoplasm of peritoneum, unspecified: Secondary | ICD-10-CM | POA: Insufficient documentation

## 2021-01-28 NOTE — Telephone Encounter (Signed)
[name] [name] with port flush apt on 02/15/21 at 12:30.  She verbalized understanding and agreement.

## 2021-01-28 NOTE — Telephone Encounter (Signed)
Pls schedule flush only with appt

## 2021-01-28 NOTE — Telephone Encounter (Signed)
[name] [name] and scheduled appointment with Dr. Alvy Bimler on 02/15/21 at 1:15 to discuss chemotherapy plan.  [name] verbalized agreement and asked if she could also schedule a port flush on the same day.

## 2021-01-29 ENCOUNTER — Encounter: Payer: Self-pay | Admitting: Oncology

## 2021-01-29 NOTE — Progress Notes (Signed)
Requested ER/PR, Her2 and PD-L1 testing on accession [id number] with [facility] via email.

## 2021-01-30 ENCOUNTER — Other Ambulatory Visit: Payer: Self-pay | Admitting: Radiation Therapy

## 2021-01-30 DIAGNOSIS — C7931 Secondary malignant neoplasm of brain: Secondary | ICD-10-CM

## 2021-01-30 NOTE — Progress Notes (Signed)
Location/Histology of Brain Tumor: Right Frontal  Patient presented after 2 focal seizures of the left face.  She was seen in the ED and was found to have multiple intracranial masses, with the largest and most concerning in the right frontal region just anterior to the motor strip along the facial region.  Pathology Report: Right Craniotomy 01/22/2021   Past or anticipated interventions, if any, per neurosurgery:  Dr. Reatha Armour -Right Frontal Craniotomy 01/22/2021 -Staples still intact -Follow-up 02/04/2021   Past or anticipated interventions, if any, per medical oncology:  Dr. Mickeal Skinner 01/31/2021   Dose of Decadron, if applicable: 2 mg BID  Dose of anti-seizure medication, if any: Keppra 1000 mg BID  Recent neurologic symptoms, if any:  Seizures: No Headaches: No Nausea: No Dizziness/ataxia: Gait is still shaky, walking very slow. Difficulty with hand coordination: Worse on the left Focal numbness/weakness: Left side weaker Visual deficits/changes: Decreased vision in both eyes, states this is her baseline.  Confusion/Memory deficits: confusion  Facial: Left side facial droop, drooling    SAFETY ISSUES: Prior radiation? Right Breast Pacemaker/ICD? No.  Has Port Possible current pregnancy? Hysterectomy-bilateral salpingo oophorectomy Is the patient on methotrexate? No  Additional Complaints / other details:  -History of Breast cancer s/p lumpectomy with radiation 1998 -History of peritoneal cancer s/p resection and chemotherapy 06/2019

## 2021-01-31 ENCOUNTER — Ambulatory Visit
Admission: RE | Admit: 2021-01-31 | Discharge: 2021-01-31 | Disposition: A | Discharge status: Home / Self Care | Payer: Medicare Other | Source: Ambulatory Visit | Attending: Radiation Oncology | Admitting: Radiation Oncology

## 2021-01-31 ENCOUNTER — Inpatient Hospital Stay (HOSPITAL_BASED_OUTPATIENT_CLINIC_OR_DEPARTMENT_OTHER): Payer: Medicare Other | Admitting: Internal Medicine

## 2021-01-31 ENCOUNTER — Other Ambulatory Visit: Payer: Self-pay

## 2021-01-31 ENCOUNTER — Encounter: Payer: Self-pay | Admitting: Radiation Oncology

## 2021-01-31 VITALS — BP 127/63 | HR 72 | Temp 98.5°F | Wt 97.0 lb

## 2021-01-31 VITALS — BP 127/63 | HR 72 | Temp 98.5°F | Resp 18 | Ht 65.0 in | Wt 97.4 lb

## 2021-01-31 DIAGNOSIS — R569 Unspecified convulsions: Secondary | ICD-10-CM | POA: Diagnosis not present

## 2021-01-31 DIAGNOSIS — Z800 Family history of malignant neoplasm of digestive organs: Secondary | ICD-10-CM | POA: Diagnosis not present

## 2021-01-31 DIAGNOSIS — C481 Malignant neoplasm of specified parts of peritoneum: Secondary | ICD-10-CM | POA: Diagnosis not present

## 2021-01-31 DIAGNOSIS — C482 Malignant neoplasm of peritoneum, unspecified: Secondary | ICD-10-CM

## 2021-01-31 DIAGNOSIS — K589 Irritable bowel syndrome without diarrhea: Secondary | ICD-10-CM | POA: Insufficient documentation

## 2021-01-31 DIAGNOSIS — K219 Gastro-esophageal reflux disease without esophagitis: Secondary | ICD-10-CM | POA: Diagnosis not present

## 2021-01-31 DIAGNOSIS — M810 Age-related osteoporosis without current pathological fracture: Secondary | ICD-10-CM | POA: Diagnosis not present

## 2021-01-31 DIAGNOSIS — C7931 Secondary malignant neoplasm of brain: Secondary | ICD-10-CM

## 2021-01-31 DIAGNOSIS — Z86000 Personal history of in-situ neoplasm of breast: Secondary | ICD-10-CM | POA: Insufficient documentation

## 2021-01-31 NOTE — Progress Notes (Signed)
Radiation Oncology         [phone]) [phone] ________________________________  Name: [name] [name]        MRN: [id number]  Date of Service: 01/31/2021 DOB: 1951/02/10  IH:[id number], Annie Main, MD  Dawley, Theodoro Doing, DO     REFERRING PHYSICIAN: Dawley, Theodoro Doing, DO   DIAGNOSIS: The primary encounter diagnosis was Metastasis to brain [facility]). Diagnoses of Brain metastases ([place]), Peritoneal carcinoma ([place]), and History of ductal carcinoma in situ (DCIS) of breast were also pertinent to this visit.   HISTORY OF PRESENT ILLNESS: [name] [name] is a 70 y.o. female seen at the request of Dr. 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  The patient was originally diagnosed with her disease in September 2020.  She had an elevated CA-125 and had findings concerning for peritoneal carcinomatosis by imaging.  Paracentesis confirmed her disease.  She underwent [place] and Taxol chemotherapy between September 2020 and March 2021 which showed improvement in her CA125, imaging in November 2020 showed improvement of her carcinomatosis and resolution of her ascites.  She did undergo interval debulking with robotic hysterectomy, BSO omentectomy and radical tumor debulking with mini laparotomy for omentectomy.  Final pathology revealed metastatic carcinoma within the serosa of the uterus, metastatic carcinoma to the left ovary and fallopian tube.  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  She continued with her systemic therapy until May 16, 2019.  She did have genetic testing that did not show any targetable mutations.  But had a VUS for ATM.  More recently she presented with debilitation dizziness and brain fog and feeling off balance.  She underwent an MRI of the brain on 12/22/2020 which showed a 2.5 cm mass in the right frontal lobe there was surrounding edema and signal loss associated with the lesion and  additional 1.2 cm enhancing lesion in the left parietal lobe was noted with mild edema.  Restaging CT at that time showed new mediastinal and left retroperitoneal adenopathy suspicious for recurrent disease.  Her case was discussed in multidisciplinary brain oncology conference and the findings in the brain were felt to be an unusual spread pattern for her GYN malignancy but progression of disease in both the lesions was seen when she had a repeat MRI on 01/17/2021, the largest lesion measured 3.3 cm in the parietal lesion measured 1.2 cm.  Ultimately is decided that she should undergo surgical resection for pathology and to clarify what this represented as the patient also had a history of early stage breast cancer remotely.  On 01/22/2021 she underwent right frontal craniotomy for resection of tumor with Dr. Reatha Armour, final pathology revealed malignancy consistent with metastatic adenocarcinoma, when discussed again in brain and spine oncology conference it was confirmed that this was felt to be from her GYN malignancy.  She is seen to discuss postoperative SRS to the right frontal cavity and definitive SRS to the left parietal lobe target.    PREVIOUS RADIATION THERAPY: Yes    1998: The right breast was treated with adjuvant radiotherapy following lumpectomy. Details are otherwise unknown but she likely received 6 to 6 1/2 weeks of therapy.  PAST MEDICAL HISTORY:  Past Medical History:  Diagnosis Date   Allergic rhinitis    Cervical dysplasia 1980   Cervicalgia    Eczema    Rosacea,dermatitis-Dr Stinehelfer   Esophageal reflux    Family history of colon cancer  Family history of melanoma    Family history of ovarian cancer    Family history of pancreatic cancer    Family history of prostate cancer    Family history of uterine cancer    Fibromyalgia    GERD (gastroesophageal reflux disease)    Glaucoma, narrow-angle    Heart murmur    Hiatal hernia    Irritable bowel syndrome    LBBB  (left bundle branch block)    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), unspecified site    DCIS   Mass of lung    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 Arlyce Dice   Mitral valve disorders(424.0)    No antibiotics required   Osteoporosis 12/2016   T score -2.3, 2014 T score -2.5 AP spine   PAC (premature atrial contraction) 2012   ,PVC's, and nonsustained atril tachycardia w aberration by heart monitor    PVC (premature ventricular contraction)    Rosacea    Stricture and stenosis of esophagus        PAST SURGICAL HISTORY: Past Surgical History:  Procedure Laterality Date   APPLICATION OF CRANIAL NAVIGATION N/A 01/22/2021   Procedure: APPLICATION OF CRANIAL NAVIGATION;  Surgeon: Karsten Ro, DO;  Location: [place];  Service: Neurosurgery;  Laterality: N/A;   BREAST LUMPECTOMY  1998   right breast with radiation   CERVICAL DISCECTOMY     C3-7 with bone graft, in three different surgeries, have titanium plate and screws   CRANIOTOMY N/A 01/22/2021   Procedure: STEREOTACTIC RIGHT FRONTAL CRANIOTOMY FOR RESECTION OF TUMOR;  Surgeon: Karsten Ro, DO;  Location: [place];  Service: Neurosurgery;  Laterality: N/A;   GYNECOLOGIC CRYOSURGERY  1980   IR IMAGING GUIDED PORT INSERTION  12/24/2018   NECK SURGERY  1993-1997   bone graft and fusion   OMENTECTOMY  02/17/2019   REFRACTIVE SURGERY     narrow angle glucoma   RHINOPLASTY  1976   sterotactic large core needle biopsy right breast Right 10/15/1996   TUBAL LIGATION  1977     FAMILY HISTORY:  Family History  Problem Relation Age of Onset   Heart disease Mother    Depression Mother    Hypertension Mother    Heart disease Father    Hypertension Father    Aneurysm Father    Uterine cancer Sister 87   Hypertension Sister    Ovarian cancer Sister 35   Melanoma Sister 19   Depression Sister    Melanoma Sister 25   Heart disease Paternal Grandfather    Stroke Maternal Grandmother    Cancer Maternal Grandfather 25       Pancreatic    Stroke Paternal Grandmother    Cancer Paternal Uncle        multiple myeloma, prostate cancer and melanoma (separate primaries)   Colon cancer Neg Hx      SOCIAL HISTORY:  reports that she has never smoked. She has never used smokeless tobacco. She reports current alcohol use of about 2.0 standard drinks per week. She reports that she does not use drugs. The patient is married and lives in [place]. She is a retired Optometrist and also worked in Arboriculturist.    ALLERGIES: Fentanyl, [place] bovis, Brimonidine tartrate, Ditropan [oxybutynin], Milk-related compounds, Other, Oxybutynin chloride, Thimerosal, Tramadol, Avelox [moxifloxacin hcl in nacl], Doxycycline, Erythromycin, Metronidazole, Minocycline, Penicillins, Quinolones, and Sulfonamide derivatives   MEDICATIONS:  Current Outpatient Medications  Medication Sig Dispense Refill   Azelaic Acid 15 % cream Apply  1 application topically daily.     calcium carbonate (OS-CAL - DOSED IN MG OF ELEMENTAL CALCIUM) 1250 (500 Ca) MG tablet Take 1 tablet by mouth daily with breakfast.     cholecalciferol (VITAMIN D3) 25 MCG (1000 UNIT) tablet Take 2,000 Units by mouth daily.     dexamethasone (DECADRON) 4 MG tablet Take 1 tablet (4 mg total) by mouth 2 (two) times daily with a meal. Take one tablet three times per day for two days, then two tablets per day for two days, then one tablet per day for two days 12 tablet 0   gabapentin (NEURONTIN) 300 MG capsule Take 300-600 mg by mouth See admin instructions. Taking 600 mg in am ;300 mg at lunch; 300 mg at hs.     levETIRAcetam (KEPPRA) 500 MG tablet Take 1 tablet (500 mg total) by mouth 2 (two) times daily. 60 tablet 0   lidocaine-prilocaine (EMLA) cream APPLY TOPICALLY AS NEEDED (PORT ACCESS). 30 g 3   Magnesium 250 MG TABS Take 250 mg by mouth daily.     Probiotic Product (PROBIOTIC DAILY PO) Take 1 capsule by mouth daily.     Sulfacetamide Sodium, Acne, 10 % LOTN Apply 1  application topically at bedtime.     VITAMIN E PO Take 50 Int'l Units/day by mouth daily.     cromolyn (NASALCROM) 5.2 MG/ACT nasal spray Place 1 spray into the nose 3 (three) times daily as needed for allergies. (Patient not taking: Reported on 01/31/2021)     dexamethasone (DECADRON) 4 MG tablet Take 1 tablet (4 mg total) by mouth daily. (Patient not taking: Reported on 01/31/2021) 30 tablet 1   levETIRAcetam (KEPPRA) 500 MG tablet Take 1 tablet (500 mg total) by mouth every 12 (twelve) hours. (Patient not taking: Reported on 01/31/2021) 60 tablet 1   risedronate (ACTONEL) 35 MG tablet Take 35 mg by mouth every 7 (seven) days. with water on empty stomach, nothing by mouth or lie down for next 30 minutes. (Patient not taking: Reported on 01/31/2021)     No current facility-administered medications for this encounter.     REVIEW OF SYSTEMS: On review of systems, the patient reports that prior to her surgery she felt like she knew that something was going on in her brain due to progressive fogginess and changes in her vision as well as new onset of seizure. She is taking Keppra as prescribed since her surgery. She reports that she is starting to taper her steroids and is currently taking Dexamethasone 2 mg BID. She also reports she feels like some of her sense of balance has improved, but she feels like even since surgery her vision and brain fog is not improved. She denies any additional seizures. No other complaints are noted.     PHYSICAL EXAM:  Wt Readings from Last 3 Encounters:  01/31/21 97 lb (44 kg)  01/31/21 97 lb 6.4 oz (44.2 kg)  01/22/21 97 lb (44 kg)   Temp Readings from Last 3 Encounters:  01/31/21 98.5 F (36.9 C)  01/31/21 98.5 F (36.9 C)  01/23/21 98.2 F (36.8 C) (Oral)   BP Readings from Last 3 Encounters:  01/31/21 127/63  01/31/21 127/63  01/23/21 106/63   Pulse Readings from Last 3 Encounters:  01/31/21 72  01/31/21 72  01/23/21 80   Pain Assessment Pain  Score: 2 /10  In general this is a thin but otherwise well appearing caucasian female in no acute distress. She's alert and oriented x4 and appropriate  throughout the examination. Cardiopulmonary assessment is negative for acute distress and she exhibits normal effort.     ECOG = 1  0 - Asymptomatic (Fully active, able to carry on all predisease activities without restriction)  1 - Symptomatic but completely ambulatory (Restricted in physically strenuous activity but ambulatory and able to carry out work of a light or sedentary nature. For example, light housework, office work)  2 - Symptomatic, <50% in bed during the day (Ambulatory and capable of all self care but unable to carry out any work activities. Up and about more than 50% of waking hours)  3 - Symptomatic, >50% in bed, but not bedbound (Capable of only limited self-care, confined to bed or chair 50% or more of waking hours)  4 - Bedbound (Completely disabled. Cannot carry on any self-care. Totally confined to bed or chair)  5 - Death   Eustace Pen MM, Creech RH, Tormey DC, et al. [phone]). "Toxicity and response criteria of the [facility]". [place] Oncol. 5 (6): 649-55    LABORATORY DATA:  Lab Results  Component Value Date   WBC 6.4 01/22/2021   HGB 10.5 (L) 01/22/2021   HCT 32.5 (L) 01/22/2021   MCV 93.1 01/22/2021   PLT 189 01/22/2021   Lab Results  Component Value Date   NA 141 01/22/2021   K 3.2 (L) 01/22/2021   CL 103 12/23/2020   CO2 28 12/23/2020   Lab Results  Component Value Date   ALT 18 12/23/2020   AST 23 12/23/2020   ALKPHOS 58 12/23/2020   BILITOT 0.6 12/23/2020      RADIOGRAPHY: MR BRAIN W WO CONTRAST  Result Date: 01/23/2021 CLINICAL DATA:  Postop assessment of metastatic disease resection. EXAM: MRI HEAD WITHOUT AND WITH CONTRAST TECHNIQUE: Multiplanar, multiecho pulse sequences of the brain and surrounding structures were obtained without and with intravenous  contrast. CONTRAST:  4.70m GADAVIST GADOBUTROL 1 MMOL/ML IV SOLN COMPARISON:  Six days ago FINDINGS: Brain: Interval resection of a posterior right frontal metastasis with collapsed cavity containing blood products. Mild adjacent vasogenic edema for recent surgery. No complicating infarct. Pneumocephalus greatest in the right frontal region where there is mild cortical mass effect. No hydrocephalus. Mild reactive dural thickening on the right, expected. Left parietal metastasis measuring 12 mm on coronal postcontrast images. Associated vasogenic edema is mild. Vascular: Major vessels are enhancing. Skull and upper cervical spine: Unremarkable right craniotomy Sinuses/Orbits: Negative IMPRESSION: 1. No complicating features of right-sided mass resection. No visible residual separate from blood products in the resection cavity. 2. Known smaller left parietal metastasis. 3. Pneumocephalus causing mild right frontal cortical mass effect. Electronically Signed   By: JJorje GuildM.D.   On: 01/23/2021 08:41   MR Brain W Wo Contrast  Result Date: 01/18/2021 CLINICAL DATA:  Brain CNS neoplasm. Staging. SRS protocol. 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 EXAM: MRI HEAD WITHOUT AND WITH CONTRAST TECHNIQUE: Multiplanar, multiecho pulse sequences of the brain and surrounding structures were obtained without and with intravenous contrast. CONTRAST:  925mMULTIHANCE GADOBENATE DIMEGLUMINE 529 MG/ML IV SOLN COMPARISON:  MR head without and with contrast 12/22/2020 FINDINGS: BRAIN New Lesions: None. Larger lesions: None. Untreated enhancing lesion located posterior right frontal lobe has increased from 25 x 22 x 22 mm to now 33 x 26 x 27 mm, with new or increased edema and mass effect seen on series 12, image 116. The lesion is peripherally enhancing and centrally necrotic with more solid tissue posteriorly and superiorly.  Minimal susceptibility along the posterior aspect of the lesion is present within the more  solid component, potentially representing remote blood products. Untreated enhancing lesion located left parietal lobe has increased from 11 mm to now 12 x 8 x 8 mm, with new or increased edema and mass effect seen on series 12, image 116. The lesion is peripherally enhancing and centrally necrotic with more solid tissue posteriorly and superiorly. Stable or Smaller lesions: None. Other Brain findings: No acute infarct or hemorrhage is present. Other scattered white matter changes stable. The ventricles are of normal size. No significant extraaxial fluid collection is present. The internal auditory canals are within normal limits. The brainstem and cerebellum are within normal limits. Vascular: Flow is present in the major intracranial arteries. Skull and upper cervical spine: Cervical fusion again noted. Craniocervical junction is within normal limits. Marrow signal is normal. Sinuses/Orbits: The paranasal sinuses and mastoid air cells are clear. The globes and orbits are within normal limits. IMPRESSION: 1. Progression of disease as evidenced by increased size of 2 dominant lesions, measuring 33 x 22 x 27 mm and 12 x 8 x 8 mm respectively. 2. No new lesions. 3. Stable atrophy and white matter disease. This likely reflects the sequela of chronic microvascular ischemia. Electronically Signed   By: San Morelle M.D.   On: 01/18/2021 12:39   DG Swallowing Func-Speech Pathology  Result Date: 01/23/2021 Table formatting from the original result was not included. Objective Swallowing Evaluation: Type of Study: MBS-Modified Barium Swallow Study  Completed and documented by Oletha Blend SLP Student Supervised and reviewed by Herbie Baltimore MA CCC-SLP Patient Details Name: [name] [name] MRN: [id number] Date of Birth: 09-Nov-1950 Today's Date: 01/23/2021 Time: SLP Start Time (ACUTE ONLY): 2542 -SLP Stop Time (ACUTE ONLY): 7062 SLP Time Calculation (min) (ACUTE ONLY): 22 min Past Medical History: Past Medical  History: Diagnosis Date  Allergic rhinitis   Cervical dysplasia 1980  Cervicalgia   Eczema   Rosacea,dermatitis-Dr Stinehelfer  Esophageal reflux   Family history of colon cancer   Family history of melanoma   Family history of ovarian cancer   Family history of pancreatic cancer   Family history of prostate cancer   Family history of uterine cancer   Fibromyalgia   GERD (gastroesophageal reflux disease)   Glaucoma, narrow-angle   Heart murmur   Hiatal hernia   Irritable bowel syndrome   LBBB (left bundle branch block)   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), unspecified site   DCIS  Mass of lung   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 Arlyce Dice  Mitral valve disorders(424.0)   No antibiotics required  Osteoporosis 12/2016  T score -2.3, 2014 T score -2.5 AP spine  PAC (premature atrial contraction) 2012  ,PVC's, and nonsustained atril tachycardia w aberration by heart monitor   PVC (premature ventricular contraction)   Rosacea   Stricture and stenosis of esophagus  Past Surgical History: Past Surgical History: Procedure Laterality Date  APPLICATION OF CRANIAL NAVIGATION N/A 37/08/2829  Procedure: APPLICATION OF CRANIAL NAVIGATION;  Surgeon: Karsten Ro, DO;  Location: [facility];  Service: Neurosurgery;  Laterality: N/A;  BREAST LUMPECTOMY  1998  right breast with radiation  CERVICAL DISCECTOMY    C3-7 with bone graft, in three different surgeries, have titanium plate and screws  CRANIOTOMY N/A 01/22/2021  Procedure: STEREOTACTIC RIGHT FRONTAL CRANIOTOMY FOR RESECTION OF TUMOR;  Surgeon: Karsten Ro, DO;  Location: [facility];  Service: Neurosurgery;  Laterality: N/A;  [place]  IR  IMAGING GUIDED PORT INSERTION  12/24/2018  NECK SURGERY  1993-1997  bone graft and fusion  OMENTECTOMY  02/17/2019  REFRACTIVE SURGERY    narrow angle glucoma  RHINOPLASTY  1976  sterotactic large core needle biopsy right breast Right 10/15/1996  TUBAL LIGATION  1977 HPI: 70 y.o. F with PMHx significant for breast cancer s/p  lumpectomy and radiation (1998), peritoneal cancer treatment with surgical resection and chemotherapy (2021) presents for right frontal craniotomy for resection of tumor after presenting to ED with 2 focal seizures of the left face revealing multiple intracranial masses, largest in the right frontal region anterior to motor strip.  Subjective: Pt awake and alert  Recommendations for follow up therapy are one component of a multi-disciplinary discharge planning process, led by the attending physician.  Recommendations may be updated based on patient status, additional functional criteria and insurance authorization. Assessment / Plan / Recommendation Clinical Impressions 01/23/2021 Clinical Impression Pt was seen for an MBS. Overall, pt presents with mild pharyngeal dysphagia c/b reduced epiglottic inversion and general muscular weakness resulting in intermittent penetration above the level of the vocal folds that does not clear (PAS 3) with thin liquids and residue on posterior pharynx, vallecula, and pyriform sinuses across all consistencies. Pt epiglottis inverts, however unable to move past posterior pharyngeal wall to fully cover airway. Penetration with thin liquids is trace and cleared spontaneously with all but 2 trials. Pt exhibited reflexive throat clear x1, clearing penetration independently when it did not spontaneously clear. Pharyngeal residue worsens with increasing consistencies, exbiting greatest residue with regular solids. Pt utilizes the multiple swallows and liquid wash strategies independently and frequently expectorates solid residue from vallecula back into oral cavity for reswallow. SLP also trialed effortful swallowing, resulting in reduction of pharyngeal residue. Pt educated re: severity of dysphagia and risk factors for future progression of swallowing impairment if medical complications progress. Recommend Dys3/thin liquid diet, administering medications whole with thin liquid. Crush or  cut pills if large. Pt requires no further acute SLP services. SLP Visit Diagnosis Dysphagia, pharyngeal phase (R13.13) Attention and concentration deficit following -- Frontal lobe and executive function deficit following -- Impact on safety and function Mild aspiration risk   Treatment Recommendations 01/23/2021 Treatment Recommendations No treatment recommended at this time   Prognosis 01/23/2021 Prognosis for Safe Diet Advancement Good Barriers to Reach Goals -- Barriers/Prognosis Comment -- Diet Recommendations 01/23/2021 SLP Diet Recommendations Dysphagia 3 (Mech soft) solids;Thin liquid Liquid Administration via Cup;Straw Medication Administration Whole meds with liquid Compensations Effortful swallow;Multiple dry swallows after each bite/sip;Follow solids with liquid Postural Changes Remain semi-upright after after feeds/meals (Comment);Seated upright at 90 degrees   Other Recommendations 01/23/2021 Recommended Consults -- Oral Care Recommendations Oral care BID Other Recommendations -- Follow Up Recommendations No SLP follow up Assistance recommended at discharge PRN Functional Status Assessment Patient has not had a recent decline in their functional status Frequency and Duration  01/23/2021 Speech Therapy Frequency (ACUTE ONLY) min 2x/week Treatment Duration 2 weeks   Oral Phase 01/23/2021 Oral Phase WFL Oral - Pudding Teaspoon -- Oral - Pudding Cup -- Oral - Honey Teaspoon -- Oral - Honey Cup -- Oral - Nectar Teaspoon -- Oral - Nectar Cup -- Oral - Nectar Straw -- Oral - Thin Teaspoon -- Oral - Thin Cup -- Oral - Thin Straw -- Oral - Puree -- Oral - Mech Soft -- Oral - Regular -- Oral - Multi-Consistency -- Oral - Pill -- Oral Phase - Comment --  Pharyngeal Phase 01/23/2021 Pharyngeal Phase Impaired  Pharyngeal- Pudding Teaspoon -- Pharyngeal -- Pharyngeal- Pudding Cup -- Pharyngeal -- Pharyngeal- Honey Teaspoon -- Pharyngeal -- Pharyngeal- Honey Cup -- Pharyngeal -- Pharyngeal- Nectar Teaspoon -- Pharyngeal --  Pharyngeal- Nectar Cup -- Pharyngeal -- Pharyngeal- Nectar Straw -- Pharyngeal -- Pharyngeal- Thin Teaspoon -- Pharyngeal -- Pharyngeal- Thin Cup Penetration/Aspiration during swallow;Reduced epiglottic inversion;Pharyngeal residue - posterior pharnyx;Pharyngeal residue - pyriform;Pharyngeal residue - valleculae Pharyngeal Material enters airway, remains ABOVE vocal cords and not ejected out Pharyngeal- Thin Straw Penetration/Aspiration during swallow;Reduced epiglottic inversion;Pharyngeal residue - valleculae;Pharyngeal residue - pyriform;Pharyngeal residue - posterior pharnyx Pharyngeal Material enters airway, remains ABOVE vocal cords and not ejected out Pharyngeal- Puree Reduced epiglottic inversion;Pharyngeal residue - valleculae;Pharyngeal residue - pyriform;Pharyngeal residue - posterior pharnyx;Compensatory strategies attempted (with notebox) Pharyngeal Material does not enter airway Pharyngeal- Mechanical Soft -- Pharyngeal -- Pharyngeal- Regular Reduced epiglottic inversion;Pharyngeal residue - valleculae;Pharyngeal residue - pyriform;Pharyngeal residue - posterior pharnyx Pharyngeal Material does not enter airway Pharyngeal- Multi-consistency -- Pharyngeal -- Pharyngeal- Pill -- Pharyngeal -- Pharyngeal Comment --  No flowsheet data found. DeBlois, Katherene Ponto 01/23/2021, 3:01 PM                         IMPRESSION/PLAN: 1. 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 Dr. Lisbeth Renshaw discusses the pathology findings and reviews the nature of metastatic brain disease. He reviewed her imaging course to date, and the rationale for an additional 3T MRI of the brain for planning purposes. She has an MRI scheduled for 02/12/21.  We discussed the risks, benefits, short, and long term effects of radiotherapy, as well as the curative intent, and the patient is interested in proceeding. Dr. Lisbeth Renshaw discusses the delivery and logistics of radiotherapy and anticipates a course of one  fraction to the left parietal lesion, and 3 fractions to the postoperative right frontal surgical cavity. Written consent is obtained and placed in the chart, a copy was provided to the patient. She will return for simulation on 02/11/21, and start her first treatment on 02/14/21. She will also follow up with Dr. Alvy Bimler to discuss next steps of systemic therapy and meet with Dr. Mickeal Skinner for steroid taper and seizure management discussion.   In a visit lasting 60 minutes, greater than 50% of the time was spent face to face discussing the patient's condition, in preparation for the discussion, and coordinating the patient's care.   The above documentation reflects my direct findings during this shared patient visit. Please see the separate note by Dr. Lisbeth Renshaw on this date for the remainder of the patient's plan of [email or web address]    Carola Rhine, [facility]   **Disclaimer: This note was dictated with voice recognition software. Similar sounding words can inadvertently be transcribed and this note may contain transcription errors which may not have been corrected upon publication of note.**

## 2021-01-31 NOTE — Progress Notes (Signed)
[facility] [place], [place] [address] [phone]   Interval Evaluation  Date of Service: 01/31/21 Patient Name: [name] [name] Patient MRN: [id number] Patient DOB: December 22, 1950 Provider: Ventura Sellers, MD  Identifying Statement:  [name] [name] is a 70 y.o. 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 Findings highly suspicious for peritoneal carcinomatosis. Recommend paracentesis for therapeutic and diagnostic purposes. I do not see an obvious primary lesion but there is some irregular enhancing soft tissue in the right adnexal area. CA 125 level may be helpful. 2. Mild surface irregularity involving the liver but I do not see any obvious changes of cirrhosis. No hepatic lesions.   11/23/2018 Tumor Marker   Patient's tumor was tested for the following markers: CA-125 Results of the tumor marker test revealed 253.   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   12/01/2018 Cancer Staging   Staging form: Ovary, Fallopian Tube, and Primary Peritoneal Carcinoma, AJCC 8th Edition - Clinical stage from 12/01/2018: cT3, cN0, cM0 - Signed by [name], MD on 12/01/2018    12/02/2018 Tumor Marker   Patient's tumor was tested for the following markers: CA-125 Results of the tumor marker test revealed 267   12/06/2018 - 05/16/2019 Chemotherapy   The patient had carboplatin and taxol for chemotherapy treatment.     12/08/2018 Procedure   Successful ultrasound-guided therapeutic paracentesis yielding 1.7 liters of peritoneal fluid.   12/24/2018 Procedure   Successful placement of a right IJ approach Power Port  with ultrasound and fluoroscopic guidance. The catheter is ready for use   01/17/2019 Tumor Marker   Patient's tumor was tested for the following markers: CA-125 Results of the tumor marker test revealed 57.2   01/31/2019 Imaging   Significant decrease in peritoneal carcinomatosis since previous study. Interval resolution of ascites.   Focal area of parenchymal consolidation in central right middle lobe, which measures 3 cm. Differential diagnosis includes infectious or inflammatory process, atelectasis, and neoplasm. Recommend short-term follow-up by chest CT in 2-3 months.     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   Surgeon: Donaciano Eva    Operative Findings:  : grossly normal uterus, ovaries normal, few scattered peritoneal nodules (83m) on serosa of uterus and tubes. The omentum (gastrocolic) was tethered to the mesentery with tumor (thin rind). Complete (optimal) resection of tumor with no gross residual disease.     02/17/2019 Pathology Results   FINAL MICROSCOPIC DIAGNOSIS: A. UTERUS, CERVIX, BILATERAL FALLOPIAN TUBES AND OVARIES, HYSTERECTOMY WITH SALPINGOOOPHORECTOMY: - Uterus: Endometrium: Inactive endometrium. No hyperplasia or malignancy. Myometrium: Unremarkable. No malignancy. Serosa: Metastatic carcinoma. No malignancy. - Cervix: Benign squamous and endocervical mucosa. No dysplasia or malignancy. - Left ovary and fallopian tube: Metastatic carcinoma. - Right ovary: No malignancy identified. - Right fallopian tube: Luminal tumor, see comment. B. OMENTUM, RESECTION: - High grade serous carcinoma. - Deposits up to at least 3 cm. - See oncology table. ONCOLOGY TABLE: OVARY or FALLOPIAN TUBE or PRIMARY  PERITONEUM:  Procedure: Hysterectomy with bilateral salpingo-oophorectomy and omentectomy. Specimen Integrity: Intact Tumor Site: Peritoneum Ovarian Surface Involvement (required only if applicable): Left ovary Fallopian Tube Surface Involvement (required only if applicable): Left Fallopian tube Tumor Size: Largest deposit 3 cm Histologic Type: High-grade serous carcinoma Histologic Grade: High-grade Implants (required for advanced stage serous/seromucinous borderline tumors only): Uterine serosa, left fallopian tube and ovary, omentum Other Tissue/ Organ Involvement: As above Largest Extrapelvic Peritoneal Focus (required only if applicable): 3 cm Peritoneal/Ascitic Fluid: Pre neoadjuvant [id number] positive for carcinoma. Treatment Effect (required only for high-grade serous carcinomas): Probably partial treatment effect in omental tissue. Regional Lymph Nodes: No lymph nodes submitted or found Pathologic Stage Classification (pTNM, AJCC 8th Edition): ypT3c, ypNX Representative Tumor Block: B5 Comment(s): There is surface involvement of the left ovary and fallopian tube but no primary tumor. Within the right fallopian tube lumen there is detached fragments of tumor but again no precursor lesion is noted in the right fallopian tube. Thus, the tumor is presumed primary peritoneal and staged as such.   03/21/2019 Tumor Marker   Patient's tumor was tested for the following markers: CA-125 Results of the tumor marker test revealed 15.1    Genetic Testing   Negative genetic testing. No pathogenic variants identified on the [facility] CancerNext + RNAinsight panel. VUS in ATM called c.[id number] identified. The report date is 05/12/2019. TumorNext HRD was originally ordered but there was not enough sample to complete this testing.   The CancerNext+RNAinsight gene panel offered by [facility] includes sequencing and rearrangement analysis for the following 36 genes: APC*, ATM*, AXIN2, BARD1, BMPR1A,  BRCA1*, BRCA2*, BRIP1*, CDH1*, CDK4, CDKN2A, CHEK2*, DICER1, MLH1*, MSH2*, MSH3, MSH6*, MUTYH*, NBN, NF1*, NTHL1, PALB2*, PMS2*, PTEN*, RAD51C*, RAD51D*, RECQL, SMAD4, SMARCA4, STK11 and TP53* (sequencing and deletion/duplication); HOXB13, POLD1 and POLE (sequencing only); EPCAM and GREM1 (deletion/duplication only). DNA and RNA analyses performed for * genes.    05/16/2019 Tumor Marker   Patient's tumor was tested for the following markers: CA-125 Results of the tumor marker test revealed 10.7   06/16/2019 Imaging   1. No evidence of residual or recurrent metastatic disease in the chest, abdomen or pelvis. 2. Masslike focus of consolidation in the right middle lobe along the major and minor fissures with associated volume loss is stable since 01/31/2019. Indolent primary pulmonary neoplasm not excluded. PET-CT may be considered for further characterization. 3. Aortic Atherosclerosis (ICD10-I70.0).   06/16/2019 Tumor Marker   Patient's tumor was tested for the following markers: CA-125 Results of the tumor marker test revealed 9.1.   12/05/2019 Tumor Marker   Patient's tumor was tested for the following markers: CA-125. Results of the tumor marker test revealed 9.0.   02/29/2020 Tumor Marker   Patient's tumor was tested for the following markers: CA-`125 Results of the tumor marker test revealed 9.7.   05/28/2020 Tumor Marker   Patient's tumor was tested for the following markers: CA-125 Results of the tumor marker test revealed 9.5   08/16/2020 Tumor Marker   Patient's tumor was tested for the following markers: CA-125 Results of the tumor marker test revealed 10.9   11/29/2020 Tumor Marker   Patient's tumor was tested for the following markers: CA-125. Results of the tumor marker test revealed 10.2.   01/22/2021 Pathology Results   SURGICAL PATHOLOGY  CASE: [id number]  PATIENT: [name] [name]  Surgical Pathology Report   Clinical History: brain metastasis (cm)    FINAL  MICROSCOPIC DIAGNOSIS:   A. 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     CNS Oncologic History 01/22/21: R frontal craniotomy, resection with Dr. Reatha Armour  Interval History: [name] [name] presents for follow up after recent craniotomy, resection with Dr. Reatha Armour.  She describes good outcome from surgery, no major new complaints.  She still has some balance difficutly   H+P (12/25/20) Patient presented to the ED on 12/22/20 with twitching of her left side of her face for 1 to 2 minutes, accompanied by slurring of speech.  There was no tongue biting, bowel or bladder incontinence.  Workup demonstrated two brain lesions consistent with suspected metastases.  She was started on Keppra (no decadron) and discharged two days later with no further seizure like events.  Priro to admission she had also been complaining of dizziness, confusion, impaired memory.  Has been seeing Dr. Alvy Bimler since 2021 for primary peritoneal carcinoma.   Medications: Current Outpatient Medications on File Prior to Visit  Medication Sig Dispense Refill   Azelaic Acid 15 % cream Apply 1 application topically daily.     calcium carbonate (OS-CAL - DOSED IN MG OF ELEMENTAL CALCIUM) 1250 (500 Ca) MG tablet Take 1 tablet by mouth daily with breakfast.     cholecalciferol (VITAMIN D3) 25 MCG (1000 UNIT) tablet Take 2,000 Units by mouth daily.     cromolyn (NASALCROM) 5.2 MG/ACT nasal spray Place 1 spray into the nose 3 (three) times daily as needed for allergies. (Patient not taking: Reported on 01/31/2021)     dexamethasone (DECADRON) 4 MG tablet Take 1 tablet (4 mg total) by mouth daily. (Patient not taking: Reported on 01/31/2021) 30 tablet 1   dexamethasone (DECADRON) 4 MG tablet Take 1 tablet (4 mg total) by mouth 2 (two) times daily with a meal. Take one tablet three times per day for two  days, then two tablets per day for two days, then one tablet per day for two days 12 tablet 0   gabapentin (NEURONTIN) 300 MG capsule Take 300-600 mg by mouth See admin instructions. Taking 600 mg in am ;300 mg at lunch; 300 mg at hs.     levETIRAcetam (KEPPRA) 500 MG tablet Take 1 tablet (500 mg total) by mouth 2 (two) times daily. 60 tablet 0   levETIRAcetam (KEPPRA) 500 MG tablet Take 1 tablet (500 mg total) by mouth every 12 (twelve) hours. (Patient not taking: Reported on 01/31/2021) 60 tablet 1   lidocaine-prilocaine (EMLA) cream APPLY TOPICALLY AS NEEDED (PORT ACCESS). 30 g 3   Magnesium 250 MG TABS Take 250 mg by mouth daily.     Probiotic Product (PROBIOTIC DAILY PO) Take 1 capsule by mouth daily.     risedronate (ACTONEL) 35 MG tablet Take 35 mg by mouth every 7 (seven) days. with water on empty stomach, nothing by mouth or lie down for next 30 minutes. (Patient not taking: Reported on 01/31/2021)     Sulfacetamide Sodium, Acne, 10 % LOTN Apply 1 application topically at bedtime.     VITAMIN E PO Take 50 Int'l Units/day by mouth daily.     No current facility-administered medications on file prior to visit.    Allergies:  Allergies  Allergen Reactions   Fentanyl Palpitations    Palpitations , fever ,headahe, N/V  " loud pounding heart beat"    Lac Bovis Diarrhea and Nausea And Vomiting   Brimonidine Tartrate     Other reaction(s): blurred vision   Ditropan [Oxybutynin]  Milk-Related Compounds Diarrhea and Nausea And Vomiting   Other Other (See Comments)   Oxybutynin Chloride Other (See Comments)    Ditropan  Pt doesn't remember    Thimerosal Other (See Comments)    Pt doesn't remember   Tramadol Other (See Comments)    Pt doesn't remember   Avelox [Moxifloxacin Hcl In Nacl] Rash   Doxycycline Rash   Erythromycin Rash   Metronidazole Rash   Minocycline Rash   Penicillins Rash    Did it involve swelling of the face/tongue/throat, SOB, or low BP? No Did it involve  sudden or severe rash/hives, skin peeling, or any reaction on the inside of your mouth or nose? No Did you need to seek medical attention at a hospital or doctor's office? No When did it last happen?      Childhood allergy If all above answers are "NO", may proceed with cephalosporin use.    Quinolones Rash   Sulfonamide Derivatives Rash   Past Medical History:  Past Medical History:  Diagnosis Date   Allergic rhinitis    Cervical dysplasia 1980   Cervicalgia    Eczema    Rosacea,dermatitis-Dr Stinehelfer   Esophageal reflux    Family history of colon cancer    Family history of melanoma    Family history of ovarian cancer    Family history of pancreatic cancer    Family history of prostate cancer    Family history of uterine cancer    Fibromyalgia    GERD (gastroesophageal reflux disease)    Glaucoma, narrow-angle    Heart murmur    Hiatal hernia    Irritable bowel syndrome    LBBB (left bundle branch block)    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), unspecified site    DCIS   Mass of lung    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 Arlyce Dice   Mitral valve disorders(424.0)    No antibiotics required   Osteoporosis 12/2016   T score -2.3, 2014 T score -2.5 AP spine   PAC (premature atrial contraction) 2012   ,PVC's, and nonsustained atril tachycardia w aberration by heart monitor    PVC (premature ventricular contraction)    Rosacea    Stricture and stenosis of esophagus    Past Surgical History:  Past Surgical History:  Procedure Laterality Date   APPLICATION OF CRANIAL NAVIGATION N/A 01/22/2021   Procedure: APPLICATION OF CRANIAL NAVIGATION;  Surgeon: Karsten Ro, DO;  Location: [facility];  Service: Neurosurgery;  Laterality: N/A;   BREAST LUMPECTOMY  1998   right breast with radiation   CERVICAL DISCECTOMY     C3-7 with bone graft, in three different surgeries, have titanium plate and screws   CRANIOTOMY N/A 01/22/2021   Procedure: STEREOTACTIC RIGHT FRONTAL CRANIOTOMY  FOR RESECTION OF TUMOR;  Surgeon: Karsten Ro, DO;  Location: [facility];  Service: Neurosurgery;  Laterality: N/A;   GYNECOLOGIC CRYOSURGERY  1980   IR IMAGING GUIDED PORT INSERTION  12/24/2018   NECK SURGERY  1993-1997   bone graft and fusion   OMENTECTOMY  02/17/2019   REFRACTIVE SURGERY     narrow angle glucoma   RHINOPLASTY  1976   sterotactic large core needle biopsy right breast Right 10/15/1996   TUBAL LIGATION  1977   Social History:  Social History   Socioeconomic History   Marital status: Married    Spouse name: Antony Haste   Number of children: 0   Years of education: Not on file   Highest education level:  Not on file  Occupational History   Occupation: Accounts Payable    Employer: [employer]  Tobacco Use   Smoking status: Never   Smokeless tobacco: Never  Vaping Use   Vaping Use: Never used  Substance and Sexual Activity   Alcohol use: Yes    Alcohol/week: 2.0 standard drinks    Types: 2 Standard drinks or equivalent per week    Comment: Social    Drug use: No   Sexual activity: Not Currently    Birth control/protection: Post-menopausal    Comment: 1st intercourse 70 yo- More than 5 partners  Other Topics Concern   Not on file  Social History Narrative   Not on file   Social Determinants of Health   Financial Resource Strain: Not on file  Food Insecurity: Not on file  Transportation Needs: Not on file  Physical Activity: Not on file  Stress: Not on file  Social Connections: Not on file  Intimate Partner Violence: Not on file   Family History:  Family History  Problem Relation Age of Onset   Heart disease Mother    Depression Mother    Hypertension Mother    Heart disease Father    Hypertension Father    Aneurysm Father    Uterine cancer Sister 48   Hypertension Sister    Ovarian cancer Sister 82   Melanoma Sister 21   Depression Sister    Melanoma Sister 1   Heart disease Paternal Grandfather    Stroke Maternal Grandmother    Cancer  Maternal Grandfather 40       Pancreatic   Stroke Paternal Grandmother    Cancer Paternal Uncle        multiple myeloma, prostate cancer and melanoma (separate primaries)   Colon cancer Neg Hx     Review of Systems: Constitutional: Doesn't report fevers, chills or abnormal weight loss Eyes: Doesn't report blurriness of vision Ears, nose, mouth, throat, and face: Doesn't report sore throat Respiratory: Doesn't report cough, dyspnea or wheezes Cardiovascular: Doesn't report palpitation, chest discomfort  Gastrointestinal:  Doesn't report nausea, constipation, diarrhea GU: Doesn't report incontinence Skin: Doesn't report skin rashes Neurological: Per HPI Musculoskeletal: Doesn't report joint pain Behavioral/Psych: Doesn't report anxiety  Physical Exam: Wt Readings from Last 3 Encounters:  01/31/21 97 lb (44 kg)  01/31/21 97 lb 6.4 oz (44.2 kg)  01/22/21 97 lb (44 kg)   Temp Readings from Last 3 Encounters:  01/31/21 98.5 F (36.9 C)  01/31/21 98.5 F (36.9 C)  01/23/21 98.2 F (36.8 C) (Oral)   BP Readings from Last 3 Encounters:  01/31/21 127/63  01/31/21 127/63  01/23/21 106/63   Pulse Readings from Last 3 Encounters:  01/31/21 72  01/31/21 72  01/23/21 80   KPS: 80. General: Alert, cooperative, pleasant, in no acute distress Head: Normal EENT: No conjunctival injection or scleral icterus.  Lungs: Resp effort normal Cardiac: Regular rate Abdomen: Non-distended abdomen Skin: No rashes cyanosis or petechiae. Extremities: No clubbing or edema  Neurologic Exam: Mental Status: Awake, alert, attentive to examiner. Oriented to self and environment. Language is fluent with intact comprehension.  Cranial Nerves: Visual acuity is grossly normal. Visual fields are full. Extra-ocular movements intact. No ptosis. Face is symmetric Motor: Tone and bulk are normal. Power is full in both arms and legs. Reflexes are symmetric, no pathologic reflexes present.  Sensory: Intact  to light touch Gait: Normal.   Labs: I have reviewed the data as listed    Component Value Date/Time  NA 141 01/22/2021 1339   K 3.2 (L) 01/22/2021 1339   CL 103 12/23/2020 0522   CO2 28 12/23/2020 0522   GLUCOSE 93 12/23/2020 0522   BUN 13 12/23/2020 0522   CREATININE 0.51 01/22/2021 1630   CREATININE 0.64 11/29/2020 1007   CALCIUM 9.7 12/23/2020 0522   PROT 7.6 12/23/2020 0522   ALBUMIN 4.1 12/23/2020 0522   AST 23 12/23/2020 0522   AST 22 11/29/2020 1007   ALT 18 12/23/2020 0522   ALT 13 11/29/2020 1007   ALKPHOS 58 12/23/2020 0522   BILITOT 0.6 12/23/2020 0522   BILITOT 0.5 11/29/2020 1007   GFRNONAA >60 01/22/2021 1630   GFRNONAA >60 11/29/2020 1007   GFRAA >60 12/05/2019 1145   Lab Results  Component Value Date   WBC 6.4 01/22/2021   NEUTROABS 6.7 12/22/2020   HGB 10.5 (L) 01/22/2021   HCT 32.5 (L) 01/22/2021   MCV 93.1 01/22/2021   PLT 189 01/22/2021    Imaging:  MR BRAIN W WO CONTRAST  Result Date: 01/23/2021 CLINICAL DATA:  Postop assessment of metastatic disease resection. EXAM: MRI HEAD WITHOUT AND WITH CONTRAST TECHNIQUE: Multiplanar, multiecho pulse sequences of the brain and surrounding structures were obtained without and with intravenous contrast. CONTRAST:  4.15m GADAVIST GADOBUTROL 1 MMOL/ML IV SOLN COMPARISON:  Six days ago FINDINGS: Brain: Interval resection of a posterior right frontal metastasis with collapsed cavity containing blood products. Mild adjacent vasogenic edema for recent surgery. No complicating infarct. Pneumocephalus greatest in the right frontal region where there is mild cortical mass effect. No hydrocephalus. Mild reactive dural thickening on the right, expected. Left parietal metastasis measuring 12 mm on coronal postcontrast images. Associated vasogenic edema is mild. Vascular: Major vessels are enhancing. Skull and upper cervical spine: Unremarkable right craniotomy Sinuses/Orbits: Negative IMPRESSION: 1. No complicating features  of right-sided mass resection. No visible residual separate from blood products in the resection cavity. 2. Known smaller left parietal metastasis. 3. Pneumocephalus causing mild right frontal cortical mass effect. Electronically Signed   By: JJorje GuildM.D.   On: 01/23/2021 08:41   MR Brain W Wo Contrast  Result Date: 01/18/2021 CLINICAL DATA:  Brain CNS neoplasm. Staging. SRS protocol. 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 EXAM: MRI HEAD WITHOUT AND WITH CONTRAST TECHNIQUE: Multiplanar, multiecho pulse sequences of the brain and surrounding structures were obtained without and with intravenous contrast. CONTRAST:  961mMULTIHANCE GADOBENATE DIMEGLUMINE 529 MG/ML IV SOLN COMPARISON:  MR head without and with contrast 12/22/2020 FINDINGS: BRAIN New Lesions: None. Larger lesions: None. Untreated enhancing lesion located posterior right frontal lobe has increased from 25 x 22 x 22 mm to now 33 x 26 x 27 mm, with new or increased edema and mass effect seen on series 12, image 116. The lesion is peripherally enhancing and centrally necrotic with more solid tissue posteriorly and superiorly. Minimal susceptibility along the posterior aspect of the lesion is present within the more solid component, potentially representing remote blood products. Untreated enhancing lesion located left parietal lobe has increased from 11 mm to now 12 x 8 x 8 mm, with new or increased edema and mass effect seen on series 12, image 116. The lesion is peripherally enhancing and centrally necrotic with more solid tissue posteriorly and superiorly. Stable or Smaller lesions: None. Other Brain findings: No acute infarct or hemorrhage is present. Other scattered white matter changes stable. The ventricles are of normal size. No significant extraaxial fluid collection is present. The internal auditory canals are within  normal limits. The brainstem and cerebellum are within normal limits. Vascular: Flow is present in the major  intracranial arteries. Skull and upper cervical spine: Cervical fusion again noted. Craniocervical junction is within normal limits. Marrow signal is normal. Sinuses/Orbits: The paranasal sinuses and mastoid air cells are clear. The globes and orbits are within normal limits. IMPRESSION: 1. Progression of disease as evidenced by increased size of 2 dominant lesions, measuring 33 x 22 x 27 mm and 12 x 8 x 8 mm respectively. 2. No new lesions. 3. Stable atrophy and white matter disease. This likely reflects the sequela of chronic microvascular ischemia. Electronically Signed   By: San Morelle M.D.   On: 01/18/2021 12:39   DG Swallowing Func-Speech Pathology  Result Date: 01/23/2021 Table formatting from the original result was not included. Objective Swallowing Evaluation: Type of Study: MBS-Modified Barium Swallow Study  Completed and documented by Oletha Blend SLP Student Supervised and reviewed by Herbie Baltimore MA CCC-SLP Patient Details Name: [name] [name] MRN: [id number] Date of Birth: 06/17/1950 Today's Date: 01/23/2021 Time: SLP Start Time (ACUTE ONLY): 7681 -SLP Stop Time (ACUTE ONLY): 1572 SLP Time Calculation (min) (ACUTE ONLY): 22 min Past Medical History: Past Medical History: Diagnosis Date  Allergic rhinitis   Cervical dysplasia 1980  Cervicalgia   Eczema   Rosacea,dermatitis-Dr Stinehelfer  Esophageal reflux   Family history of colon cancer   Family history of melanoma   Family history of ovarian cancer   Family history of pancreatic cancer   Family history of prostate cancer   Family history of uterine cancer   Fibromyalgia   GERD (gastroesophageal reflux disease)   Glaucoma, narrow-angle   Heart murmur   Hiatal hernia   Irritable bowel syndrome   LBBB (left bundle branch block)   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), unspecified site   DCIS  Mass of lung   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 Arlyce Dice  Mitral valve disorders(424.0)   No antibiotics required  Osteoporosis 12/2016  T score  -2.3, 2014 T score -2.5 AP spine  PAC (premature atrial contraction) 2012  ,PVC's, and nonsustained atril tachycardia w aberration by heart monitor   PVC (premature ventricular contraction)   Rosacea   Stricture and stenosis of esophagus  Past Surgical History: Past Surgical History: Procedure Laterality Date  APPLICATION OF CRANIAL NAVIGATION N/A 62/0/3559  Procedure: APPLICATION OF CRANIAL NAVIGATION;  Surgeon: Karsten Ro, DO;  Location: [facility];  Service: Neurosurgery;  Laterality: N/A;  BREAST LUMPECTOMY  1998  right breast with radiation  CERVICAL DISCECTOMY    C3-7 with bone graft, in three different surgeries, have titanium plate and screws  CRANIOTOMY N/A 01/22/2021  Procedure: STEREOTACTIC RIGHT FRONTAL CRANIOTOMY FOR RESECTION OF TUMOR;  Surgeon: Karsten Ro, DO;  Location: [facility];  Service: Neurosurgery;  Laterality: N/A;  GYNECOLOGIC CRYOSURGERY  1980  IR IMAGING GUIDED PORT INSERTION  12/24/2018  NECK SURGERY  1993-1997  bone graft and fusion  OMENTECTOMY  02/17/2019  REFRACTIVE SURGERY    narrow angle glucoma  RHINOPLASTY  1976  sterotactic large core needle biopsy right breast Right 10/15/1996  TUBAL LIGATION  1977 HPI: 70 y.o. F with PMHx significant for breast cancer s/p lumpectomy and radiation (1998), peritoneal cancer treatment with surgical resection and chemotherapy (2021) presents for right frontal craniotomy for resection of tumor after presenting to ED with 2 focal seizures of the left face revealing multiple intracranial masses, largest in the right frontal region anterior to motor strip.  Subjective: Pt  awake and alert  Recommendations for follow up therapy are one component of a multi-disciplinary discharge planning process, led by the attending physician.  Recommendations may be updated based on patient status, additional functional criteria and insurance authorization. Assessment / Plan / Recommendation Clinical Impressions 01/23/2021 Clinical Impression Pt was seen for an MBS.  Overall, pt presents with mild pharyngeal dysphagia c/b reduced epiglottic inversion and general muscular weakness resulting in intermittent penetration above the level of the vocal folds that does not clear (PAS 3) with thin liquids and residue on posterior pharynx, vallecula, and pyriform sinuses across all consistencies. Pt epiglottis inverts, however unable to move past posterior pharyngeal wall to fully cover airway. Penetration with thin liquids is trace and cleared spontaneously with all but 2 trials. Pt exhibited reflexive throat clear x1, clearing penetration independently when it did not spontaneously clear. Pharyngeal residue worsens with increasing consistencies, exbiting greatest residue with regular solids. Pt utilizes the multiple swallows and liquid wash strategies independently and frequently expectorates solid residue from vallecula back into oral cavity for reswallow. SLP also trialed effortful swallowing, resulting in reduction of pharyngeal residue. Pt educated re: severity of dysphagia and risk factors for future progression of swallowing impairment if medical complications progress. Recommend Dys3/thin liquid diet, administering medications whole with thin liquid. Crush or cut pills if large. Pt requires no further acute SLP services. SLP Visit Diagnosis Dysphagia, pharyngeal phase (R13.13) Attention and concentration deficit following -- Frontal lobe and executive function deficit following -- Impact on safety and function Mild aspiration risk   Treatment Recommendations 01/23/2021 Treatment Recommendations No treatment recommended at this time   Prognosis 01/23/2021 Prognosis for Safe Diet Advancement Good Barriers to Reach Goals -- Barriers/Prognosis Comment -- Diet Recommendations 01/23/2021 SLP Diet Recommendations Dysphagia 3 (Mech soft) solids;Thin liquid Liquid Administration via Cup;Straw Medication Administration Whole meds with liquid Compensations Effortful swallow;Multiple dry  swallows after each bite/sip;Follow solids with liquid Postural Changes Remain semi-upright after after feeds/meals (Comment);Seated upright at 90 degrees   Other Recommendations 01/23/2021 Recommended Consults -- Oral Care Recommendations Oral care BID Other Recommendations -- Follow Up Recommendations No SLP follow up Assistance recommended at discharge PRN Functional Status Assessment Patient has not had a recent decline in their functional status Frequency and Duration  01/23/2021 Speech Therapy Frequency (ACUTE ONLY) min 2x/week Treatment Duration 2 weeks   Oral Phase 01/23/2021 Oral Phase WFL Oral - Pudding Teaspoon -- Oral - Pudding Cup -- Oral - Honey Teaspoon -- Oral - Honey Cup -- Oral - Nectar Teaspoon -- Oral - Nectar Cup -- Oral - Nectar Straw -- Oral - Thin Teaspoon -- Oral - Thin Cup -- Oral - Thin Straw -- Oral - Puree -- Oral - Mech Soft -- Oral - Regular -- Oral - Multi-Consistency -- Oral - Pill -- Oral Phase - Comment --  Pharyngeal Phase 01/23/2021 Pharyngeal Phase Impaired Pharyngeal- Pudding Teaspoon -- Pharyngeal -- Pharyngeal- Pudding Cup -- Pharyngeal -- Pharyngeal- Honey Teaspoon -- Pharyngeal -- Pharyngeal- Honey Cup -- Pharyngeal -- Pharyngeal- Nectar Teaspoon -- Pharyngeal -- Pharyngeal- Nectar Cup -- Pharyngeal -- Pharyngeal- Nectar Straw -- Pharyngeal -- Pharyngeal- Thin Teaspoon -- Pharyngeal -- Pharyngeal- Thin Cup Penetration/Aspiration during swallow;Reduced epiglottic inversion;Pharyngeal residue - posterior pharnyx;Pharyngeal residue - pyriform;Pharyngeal residue - valleculae Pharyngeal Material enters airway, remains ABOVE vocal cords and not ejected out Pharyngeal- Thin Straw Penetration/Aspiration during swallow;Reduced epiglottic inversion;Pharyngeal residue - valleculae;Pharyngeal residue - pyriform;Pharyngeal residue - posterior pharnyx Pharyngeal Material enters airway, remains ABOVE vocal cords and not ejected out Pharyngeal- Puree  Reduced epiglottic inversion;Pharyngeal  residue - valleculae;Pharyngeal residue - pyriform;Pharyngeal residue - posterior pharnyx;Compensatory strategies attempted (with notebox) Pharyngeal Material does not enter airway Pharyngeal- Mechanical Soft -- Pharyngeal -- Pharyngeal- Regular Reduced epiglottic inversion;Pharyngeal residue - valleculae;Pharyngeal residue - pyriform;Pharyngeal residue - posterior pharnyx Pharyngeal Material does not enter airway Pharyngeal- Multi-consistency -- Pharyngeal -- Pharyngeal- Pill -- Pharyngeal -- Pharyngeal Comment --  No flowsheet data found. DeBlois, Katherene Ponto 01/23/2021, 3:01 PM                        Assessment/Plan Brain metastases [facility])  Seizure ([place])  [name] [name] is clinically stable, now following craniotomy and resection of right frontal metastasis.  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    Next step is for post-operative radiosurgery to resection cavity and additional site of disease yet untreated.  She is pending 3T planning study.  For now should continue Keppra 1016m BID for seizures.  She will decrease decadron to 283mdaily x4 days, then STOP if tolerated.  We appreciate the opportunity to participate in the care of JaJOHNASIA [name] She may follow up with our clinic or radiation oncology following post-RT MRI.  All questions were answered. The patient knows to call the clinic with any problems, questions or concerns. No barriers to learning were detected.  The total time spent in the encounter was 30 minutes and more than 50% was on counseling and review of test results   ZaVentura SellersMD Medical Director of Neuro-Oncology [facility] We[place]1/17/22 9:54 AM

## 2021-02-01 ENCOUNTER — Encounter: Payer: Self-pay | Admitting: *Deleted

## 2021-02-01 NOTE — Progress Notes (Signed)
[place] CSW Psychosocial Distress Screening  Social Work was referred by distress screening protocol.  The patient scored a 5 on the Psychosocial Distress Thermometer which indicates moderate distress. Social Work Theatre manager contacted patient by phone to assess for distress and other psychosocial needs.  Patient reports that "Considering what I've been through I'm doing very well". Patient also expressed feeling extremely tired and foggy-headed. SW Intern offered to send more information about support services in the mail for patient to review when she has more energy, which pt agreed would be preferable to speaking on the phone given her current level of exhaustion. Intern has mailed info about [facility] support service resources, as well as contact information, and encouraged pt to call with any questions or concerns.   ONCBCN DISTRESS SCREENING 01/31/2021  Screening Type Initial Screening  Distress experienced in past week (1-10) 5  Emotional problem type Adjusting to illness;Nervousness/Anxiety  Spiritual/Religous concerns type Facing my mortality  Physical Problem type Getting around;Mouth sores/swallowing;Talking  Other Contact via phone [place], Social Work Intern Supervised by Gwinda Maine, LCSW

## 2021-02-05 ENCOUNTER — Ambulatory Visit: Payer: Medicare Other | Admitting: Internal Medicine

## 2021-02-05 NOTE — Progress Notes (Signed)
Has armband been applied?    Does patient have an allergy to IV contrast dye?: no   Has patient ever received premedication for IV contrast dye?: n/a   Does patient take metformin?: No  If patient does take metformin when was the last dose: n/a  Date of lab work: 01/22/2021 BUN:  CR: 0.51 eGfr: >60  IV site: Right Chest Port  Has IV site been added to flowsheet? Yes

## 2021-02-11 ENCOUNTER — Other Ambulatory Visit: Payer: Self-pay

## 2021-02-11 ENCOUNTER — Ambulatory Visit
Admission: RE | Admit: 2021-02-11 | Discharge: 2021-02-11 | Disposition: A | Discharge status: Home / Self Care | Payer: Medicare Other | Source: Ambulatory Visit | Attending: Radiation Oncology | Admitting: Radiation Oncology

## 2021-02-11 VITALS — BP 123/52 | HR 91 | Temp 97.6°F | Resp 18 | Ht 65.0 in | Wt 97.8 lb

## 2021-02-11 DIAGNOSIS — Z452 Encounter for adjustment and management of vascular access device: Secondary | ICD-10-CM | POA: Insufficient documentation

## 2021-02-11 DIAGNOSIS — C482 Malignant neoplasm of peritoneum, unspecified: Secondary | ICD-10-CM | POA: Diagnosis not present

## 2021-02-11 DIAGNOSIS — C7931 Secondary malignant neoplasm of brain: Secondary | ICD-10-CM | POA: Diagnosis not present

## 2021-02-11 LAB — SURGICAL PATHOLOGY

## 2021-02-11 MED ORDER — HEPARIN SOD (PORK) LOCK FLUSH 100 UNIT/ML IV SOLN
500.0000 [IU] | Freq: Once | INTRAVENOUS | Status: AC
  Administered 2021-02-11: 15:00:00 500 [IU] via INTRAVENOUS

## 2021-02-11 MED ORDER — SODIUM CHLORIDE 0.9% FLUSH
10.0000 mL | Freq: Once | INTRAVENOUS | Status: AC
  Administered 2021-02-11: 15:00:00 10 mL via INTRAVENOUS

## 2021-02-12 ENCOUNTER — Ambulatory Visit
Admission: RE | Admit: 2021-02-12 | Discharge: 2021-02-12 | Disposition: A | Discharge status: Home / Self Care | Payer: Medicare Other | Source: Ambulatory Visit | Attending: Radiation Oncology | Admitting: Radiation Oncology

## 2021-02-12 ENCOUNTER — Ambulatory Visit: Payer: Medicare Other | Admitting: Radiation Oncology

## 2021-02-12 ENCOUNTER — Institutional Professional Consult (permissible substitution): Payer: Medicare Other | Admitting: Radiation Oncology

## 2021-02-12 ENCOUNTER — Other Ambulatory Visit: Payer: Medicare Other

## 2021-02-12 ENCOUNTER — Ambulatory Visit: Payer: Medicare Other

## 2021-02-12 DIAGNOSIS — C7931 Secondary malignant neoplasm of brain: Secondary | ICD-10-CM | POA: Diagnosis not present

## 2021-02-12 MED ORDER — GADOBENATE DIMEGLUMINE 529 MG/ML IV SOLN
8.0000 mL | Freq: Once | INTRAVENOUS | Status: AC | PRN
  Administered 2021-02-12: 8 mL via INTRAVENOUS

## 2021-02-13 ENCOUNTER — Other Ambulatory Visit (HOSPITAL_COMMUNITY): Payer: Self-pay

## 2021-02-13 ENCOUNTER — Ambulatory Visit: Payer: Medicare Other | Admitting: Radiation Oncology

## 2021-02-13 DIAGNOSIS — C7931 Secondary malignant neoplasm of brain: Secondary | ICD-10-CM | POA: Diagnosis not present

## 2021-02-13 DIAGNOSIS — Z452 Encounter for adjustment and management of vascular access device: Secondary | ICD-10-CM | POA: Diagnosis not present

## 2021-02-13 DIAGNOSIS — C482 Malignant neoplasm of peritoneum, unspecified: Secondary | ICD-10-CM | POA: Diagnosis not present

## 2021-02-13 MED FILL — Lidocaine-Prilocaine Cream 2.5-2.5%: CUTANEOUS | 30 days supply | Qty: 30 | Fill #1 | Status: AC

## 2021-02-14 ENCOUNTER — Other Ambulatory Visit: Payer: Self-pay

## 2021-02-14 ENCOUNTER — Ambulatory Visit
Admission: RE | Admit: 2021-02-14 | Discharge: 2021-02-14 | Disposition: A | Discharge status: Home / Self Care | Payer: Medicare Other | Source: Ambulatory Visit | Attending: Radiation Oncology | Admitting: Radiation Oncology

## 2021-02-14 ENCOUNTER — Other Ambulatory Visit: Payer: Medicare Other

## 2021-02-14 ENCOUNTER — Ambulatory Visit: Payer: Medicare Other | Admitting: Radiation Oncology

## 2021-02-14 DIAGNOSIS — C7931 Secondary malignant neoplasm of brain: Secondary | ICD-10-CM | POA: Diagnosis not present

## 2021-02-14 DIAGNOSIS — C482 Malignant neoplasm of peritoneum, unspecified: Secondary | ICD-10-CM | POA: Diagnosis not present

## 2021-02-14 NOTE — Progress Notes (Signed)
[name] [name] rested with [name] for 15 minutes following her [place] treatment.  Patient denies headache, dizziness, nausea, diplopia or ringing in the ears. Denies fatigue. Patient without complaints. Understands to avoid strenuous activity for the next 24 hours and call [phone] with needs.   Patient ambulated to waiting room without difficulty.  BP (!) 116/45   Pulse 81   Temp (!) 97.5 F (36.4 C)   Resp 18   SpO2 100%    [name] [name], BSN

## 2021-02-15 ENCOUNTER — Inpatient Hospital Stay: Payer: Medicare Other | Attending: Gynecologic Oncology | Admitting: Hematology and Oncology

## 2021-02-15 ENCOUNTER — Other Ambulatory Visit: Payer: Self-pay

## 2021-02-15 ENCOUNTER — Inpatient Hospital Stay: Payer: Medicare Other

## 2021-02-15 ENCOUNTER — Encounter: Payer: Self-pay | Admitting: Hematology and Oncology

## 2021-02-15 VITALS — BP 130/56 | HR 81 | Temp 97.4°F | Resp 18 | Ht 65.0 in | Wt 97.8 lb

## 2021-02-15 DIAGNOSIS — Z7189 Other specified counseling: Secondary | ICD-10-CM

## 2021-02-15 DIAGNOSIS — C482 Malignant neoplasm of peritoneum, unspecified: Secondary | ICD-10-CM | POA: Insufficient documentation

## 2021-02-15 DIAGNOSIS — C7931 Secondary malignant neoplasm of brain: Secondary | ICD-10-CM | POA: Diagnosis not present

## 2021-02-15 MED ORDER — PROCHLORPERAZINE MALEATE 10 MG PO TABS: 10.0000 mg | ORAL_TABLET | Freq: Four times a day (QID) | ORAL | 1 refills | Status: DC | PRN

## 2021-02-15 MED ORDER — ONDANSETRON HCL 8 MG PO TABS: 8.0000 mg | ORAL_TABLET | Freq: Three times a day (TID) | ORAL | 1 refills | Status: DC | PRN

## 2021-02-15 NOTE — Assessment & Plan Note (Signed)
She is very anxious She appears overwhelmed and debilitated After 1 hour of discussion, she is tearful because she have difficulties retaining memories despite writing a lot of things down I tried to reassure the patient and recommend she focus on completing radiation treatment We will call her and check on her next week

## 2021-02-15 NOTE — Progress Notes (Signed)
DISCONTINUE ON PATHWAY REGIMEN - Ovarian  No Medical Intervention - Off Treatment.  REASON: Disease Progression PRIOR TREATMENT: Off Treatment  START OFF PATHWAY REGIMEN - Ovarian   [id number]:Carboplatin AUC=4 D1 + Gemcitabine 800 mg/m2 D1, 8 q21 Days:   A cycle is every 21 days:     Gemcitabine      Carboplatin   **Always confirm dose/schedule in your pharmacy ordering system**  Patient Characteristics: Recurrent or Progressive Disease, Second Line, Platinum Sensitive and ? 6 Months Since Last Therapy, Not a Candidate for Secondary Debulking Surgery BRCA Mutation Status: Absent Therapeutic Status: Recurrent or Progressive Disease Line of Therapy: Second Line  Intent of Therapy: Non-Curative / Palliative Intent, Discussed with Patient

## 2021-02-15 NOTE — Progress Notes (Signed)
ON PATHWAY REGIMEN - Ovarian  No Change  Continue With Treatment as Ordered.  Original Decision Date/Time: 11/30/2018 14:12     A cycle is every 21 days:     Paclitaxel      Carboplatin   **Always confirm dose/schedule in your pharmacy ordering system**  Patient Characteristics: Preoperative or Nonsurgical Candidate (Clinical Staging), Newly Diagnosed, Neoadjuvant Therapy followed by Surgery Therapeutic Status: Preoperative or Nonsurgical Candidate (Clinical Staging) BRCA Mutation Status: Awaiting Test Results AJCC T Category: cT3 AJCC 8 Stage Grouping: Unknown AJCC N Category: cN0 AJCC M Category: cM0 Therapy Plan: Neoadjuvant Therapy followed by Surgery Intent of Therapy: Curative Intent, Discussed with Patient

## 2021-02-15 NOTE — Assessment & Plan Note (Signed)
She is undergoing radiation treatment She has difficulties retaining memory and executing certain complex activities I recommend referral for neuro physical therapy and rehab and she is in agreement

## 2021-02-15 NOTE — Progress Notes (Signed)
[name] OFFICE PROGRESS NOTE  Patient Care Team: Orpah Melter, MD as PCP - General (Family Medicine) Sueanne Margarita, MD as PCP - Cardiology (Cardiology) Awanda Mink Craige Cotta, RN as Oncology Nurse Navigator (Oncology)  ASSESSMENT & PLAN:  Peritoneal carcinoma 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 I gave her a copy of her pathology report She is currently receiving palliative radiation to the brain I recommend continue on supportive care and radiation treatment this month I plan to repeat CT imaging of the chest, abdomen and pelvis first week of January and see her back with plan to start her on palliative systemic chemotherapy on January 5 I reviewed the guidelines with the patient We discussed the risk, benefits, side effects of combination chemotherapy of carboplatin with gemcitabine, carboplatin with Doxil and carboplatin with paclitaxel with or without bevacizumab I explained to the patient the rationale behind not starting bevacizumab due to recent brain surgery I would probably add bevacizumab after 3 cycles of treatment We discussed the timing of chemotherapy We discussed the rationale behind getting baseline imaging study and repeat another CT imaging after 3 cycles of treatment The patient is noted to have history of cardiomyopathy so combination treatment of carboplatin with Doxil is not desirable 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 2005 May-Jun;15 Suppl 1:36-41. Combination therapy with gemcitabine and  carboplatin in recurrent ovarian cancer. Pfisterer J1, [place] I, [place] A, Eisenhauer E; AGO-OVAR,; NCIC CTG; EORTC GCG.  Participants with recurrent platinum-sensitive ovarian cancer were randomly assigned to receive either gemcitabine-carboplatin or carboplatin every 21 days. The primary objective was to compare progression-free survival (PFS) between the groups. From September 1999 to April 2002, 356 patients (178 participants received gemcitabine-carboplatin, 178 received carboplatin only) were randomized to treatment. Patients received six cycles of either gemcitabine-carboplatin or carboplatin. With a median follow-up of 17 months, median PFS was 8.6 months for gemcitabine-carboplatin (95% confidence interval [CI] 7.9-9.7 months) and 5.8 months for carboplatin (95% CI 5.2-7.1 months; hazard ratio [HR] 0.72 [95% CI 0.58-0.90; P = 0.0032]). The response rate for the gemcitabine-carboplatin group was 47.2% (95% CI 39.9-54.5%) and 30.9% for carboplatin group (95% CI 24.1-37.7%; P = 0.0016). The HR for overall survival was 0.96 (95% CI 0.75-1.23; P = 0.7349). Patients treated with gemcitabine-carboplatin reported significantly faster palliation of abdominal symptoms and a significantly improved global quality of life. Gemcitabine-carboplatin treatment significantly improves the PFS of patients with platinum-sensitive recurrent ovarian cancer.  We discussed the role of chemotherapy. The intent is of palliative intent.  We discussed some of the risks, benefits, side-effects of carboplatin & gemcitabine  Some of the short term side-effects included, though not limited to, including weight loss, life threatening infections, risk of allergic reactions, need for transfusions of blood products, nausea, vomiting, change in bowel habits, loss of hair, admission to hospital for various reasons, and risks of death.   Long term side-effects are also discussed including risks of infertility, permanent damage to  nerve function, hearing loss, chronic fatigue, kidney damage with possibility needing hemodialysis, and rare secondary malignancy including bone marrow disorders.  The patient is aware that the response rates discussed  earlier is not guaranteed.  After a long discussion, patient made an informed decision to proceed with the prescribed plan of care.   Patient education material was dispensed I will schedule CT imaging for January and see her back before we start treatment    Brain metastases [facility]) She is undergoing radiation treatment She has difficulties retaining memory and executing certain complex activities I recommend referral for neuro physical therapy and rehab and she is in agreement  Goals of care, counseling/discussion She is very anxious She appears overwhelmed and debilitated After 1 hour of discussion, she is tearful because she have difficulties retaining memories despite writing a lot of things down I tried to reassure the patient and recommend she focus on completing radiation treatment We will call her and check on her next week  Orders Placed This Encounter  Procedures   CT CHEST ABDOMEN PELVIS W CONTRAST    Standing Status:   Future    Standing Expiration Date:   02/15/2022    Order Specific Question:   Preferred imaging location?    Answer:   [facility]    Order Specific Question:   Radiology Contrast Protocol - do NOT remove file path    Answer:   [email or web address]   CBC with Differential (Cancer Center Only)    Standing Status:   Standing    Number of Occurrences:   20    Standing Expiration Date:   02/15/2022   CMP ([place] only)    Standing Status:   Standing    Number of Occurrences:   20    Standing Expiration Date:   02/15/2022   Ambulatory referral to Physical Therapy    Referral Priority:   Routine    Referral Type:   Physical Medicine    Referral Reason:   Specialty Services Required     Requested Specialty:   Physical Therapy    Number of Visits Requested:   1    All questions were answered. The patient knows to call the clinic with any problems, questions or concerns. The total time spent in the appointment was 80 minutes encounter with patients including review of chart and various tests results, discussions about plan of care and coordination of care plan   [name], MD 02/15/2021 3:41 PM  INTERVAL HISTORY: Please see below for problem oriented charting. she returns for treatment follow-up with her husband to discuss recent pathology and plan of care Since surgery, she have difficulties with executing complex movement She has poor memory She have some difficulties with swallowing She has an gait imbalance She had recent flare of fibromyalgia She had numerous questions related to plan of care moving forward She complained of excessive fatigue with recent treatment  REVIEW OF SYSTEMS:   Constitutional: Denies fevers, chills or abnormal weight loss Eyes: Denies blurriness of vision Ears, nose, mouth, throat, and face: Denies mucositis or sore throat Respiratory: Denies cough, dyspnea or wheezes Cardiovascular: Denies palpitation, chest discomfort or lower extremity swelling Gastrointestinal:  Denies nausea, heartburn or change in bowel habits Skin: Denies abnormal skin rashes Lymphatics: Denies new lymphadenopathy or easy bruising Behavioral/Psych: Mood is stable, no new changes  All other systems were reviewed with the patient and are negative.  I have reviewed the past medical history, past surgical history, social history and family history with the patient and they are unchanged from previous note.  ALLERGIES:  is allergic to fentanyl, lac bovis, brimonidine tartrate, ditropan [oxybutynin], milk-related compounds, other, oxybutynin chloride, thimerosal, tramadol,  avelox [moxifloxacin hcl in nacl], doxycycline, erythromycin, metronidazole, minocycline,  penicillins, quinolones, and sulfonamide derivatives.  MEDICATIONS:  Current Outpatient Medications  Medication Sig Dispense Refill   Azelaic Acid 15 % cream Apply 1 application topically daily.     calcium carbonate (OS-CAL - DOSED IN MG OF ELEMENTAL CALCIUM) 1250 (500 Ca) MG tablet Take 1 tablet by mouth daily with breakfast.     cholecalciferol (VITAMIN D3) 25 MCG (1000 UNIT) tablet Take 2,000 Units by mouth daily.     cromolyn (NASALCROM) 5.2 MG/ACT nasal spray Place 1 spray into the nose 3 (three) times daily as needed for allergies. (Patient not taking: Reported on 01/31/2021)     dexamethasone (DECADRON) 4 MG tablet Take 1 tablet (4 mg total) by mouth daily. (Patient not taking: Reported on 01/31/2021) 30 tablet 1   dexamethasone (DECADRON) 4 MG tablet Take 1 tablet (4 mg total) by mouth 2 (two) times daily with a meal. Take one tablet three times per day for two days, then two tablets per day for two days, then one tablet per day for two days 12 tablet 0   gabapentin (NEURONTIN) 300 MG capsule Take 300-600 mg by mouth See admin instructions. Taking 600 mg in am ;300 mg at lunch; 300 mg at hs.     levETIRAcetam (KEPPRA) 500 MG tablet Take 1 tablet (500 mg total) by mouth 2 (two) times daily. 60 tablet 0   levETIRAcetam (KEPPRA) 500 MG tablet Take 1 tablet (500 mg total) by mouth every 12 (twelve) hours. (Patient not taking: Reported on 01/31/2021) 60 tablet 1   lidocaine-prilocaine (EMLA) cream APPLY TOPICALLY AS NEEDED (PORT ACCESS). 30 g 3   Magnesium 250 MG TABS Take 250 mg by mouth daily.     ondansetron (ZOFRAN) 8 MG tablet Take 1 tablet (8 mg total) by mouth every 8 (eight) hours as needed. 30 tablet 1   Probiotic Product (PROBIOTIC DAILY PO) Take 1 capsule by mouth daily.     prochlorperazine (COMPAZINE) 10 MG tablet Take 1 tablet (10 mg total) by mouth every 6 (six) hours as needed (Nausea or vomiting). 30 tablet 1   risedronate (ACTONEL) 35 MG tablet Take 35 mg by mouth every 7  (seven) days. with water on empty stomach, nothing by mouth or lie down for next 30 minutes. (Patient not taking: Reported on 01/31/2021)     Sulfacetamide Sodium, Acne, 10 % LOTN Apply 1 application topically at bedtime.     VITAMIN E PO Take 50 Int'l Units/day by mouth daily.     No current facility-administered medications for this visit.    SUMMARY OF ONCOLOGIC HISTORY: Oncology History Overview Note  Negative genetics High grade serous ER 70% PR 5%  PD-L1 CPS 1%   Peritoneal carcinoma ([place])  11/23/2018 Imaging   Ct abdomen and pelvis 1. Findings highly suspicious for peritoneal carcinomatosis. Recommend paracentesis for therapeutic and diagnostic purposes. I do not see an obvious primary lesion but there is some irregular enhancing soft tissue in the right adnexal area. CA 125 level may be helpful. 2. Mild surface irregularity involving the liver but I do not see any obvious changes of cirrhosis. No hepatic lesions.   11/23/2018 Tumor Marker   Patient's tumor was tested for the following markers: CA-125 Results of the tumor marker test revealed 253.   11/25/2018 Initial Diagnosis   Peritoneal carcinoma ([place])   11/25/2018 Imaging   US paracentesis Successful ultrasound-guided paracentesis yielding 3.4 L of peritoneal fluid.   12/01/2018 Cancer Staging  Staging form: Ovary, Fallopian Tube, and Primary Peritoneal Carcinoma, AJCC 8th Edition - Clinical stage from 12/01/2018: Stage IVA (rcT3, cN0, pM1a) - Signed by [name], MD on 02/14/2021 Stage prefix: Recurrence    12/02/2018 Tumor Marker   Patient's tumor was tested for the following markers: CA-125 Results of the tumor marker test revealed 267   12/06/2018 - 05/16/2019 Chemotherapy   The patient had carboplatin and taxol for chemotherapy treatment.     12/08/2018 Procedure   Successful ultrasound-guided therapeutic paracentesis yielding 1.7 liters of peritoneal fluid.   12/24/2018 Procedure   Successful placement of a  right IJ approach Power Port with ultrasound and fluoroscopic guidance. The catheter is ready for use   01/17/2019 Tumor Marker   Patient's tumor was tested for the following markers: CA-125 Results of the tumor marker test revealed 57.2   01/31/2019 Imaging   Significant decrease in peritoneal carcinomatosis since previous study. Interval resolution of ascites.   Focal area of parenchymal consolidation in central right middle lobe, which measures 3 cm. Differential diagnosis includes infectious or inflammatory process, atelectasis, and neoplasm. Recommend short-term follow-up by chest CT in 2-3 months.     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   Surgeon: Donaciano Eva    Operative Findings:  : grossly normal uterus, ovaries normal, few scattered peritoneal nodules (58m) on serosa of uterus and tubes. The omentum (gastrocolic) was tethered to the mesentery with tumor (thin rind). Complete (optimal) resection of tumor with no gross residual disease.     02/17/2019 Pathology Results   FINAL MICROSCOPIC DIAGNOSIS: A. UTERUS, CERVIX, BILATERAL FALLOPIAN TUBES AND OVARIES, HYSTERECTOMY WITH SALPINGOOOPHORECTOMY: - Uterus: Endometrium: Inactive endometrium. No hyperplasia or malignancy. Myometrium: Unremarkable. No malignancy. Serosa: Metastatic carcinoma. No malignancy. - Cervix: Benign squamous and endocervical mucosa. No dysplasia or malignancy. - Left ovary and fallopian tube: Metastatic carcinoma. - Right ovary: No malignancy identified. - Right fallopian tube: Luminal tumor, see comment. B. OMENTUM, RESECTION: - High grade serous carcinoma. - Deposits up to at least 3 cm. - See oncology table. ONCOLOGY TABLE: OVARY or FALLOPIAN  TUBE or PRIMARY PERITONEUM: Procedure: Hysterectomy with bilateral salpingo-oophorectomy and omentectomy. Specimen Integrity: Intact Tumor Site: Peritoneum Ovarian Surface Involvement (required only if applicable): Left ovary Fallopian Tube Surface Involvement (required only if applicable): Left Fallopian tube Tumor Size: Largest deposit 3 cm Histologic Type: High-grade serous carcinoma Histologic Grade: High-grade Implants (required for advanced stage serous/seromucinous borderline tumors only): Uterine serosa, left fallopian tube and ovary, omentum Other Tissue/ Organ Involvement: As above Largest Extrapelvic Peritoneal Focus (required only if applicable): 3 cm Peritoneal/Ascitic Fluid: Pre neoadjuvant [id number] positive for carcinoma. Treatment Effect (required only for high-grade serous carcinomas): Probably partial treatment effect in omental tissue. Regional Lymph Nodes: No lymph nodes submitted or found Pathologic Stage Classification (pTNM, AJCC 8th Edition): ypT3c, ypNX Representative Tumor Block: B5 Comment(s): There is surface involvement of the left ovary and fallopian tube but no primary tumor. Within the right fallopian tube lumen there is detached fragments of tumor but again no precursor lesion is noted in the right fallopian tube. Thus, the tumor is presumed primary peritoneal and staged as such.   03/21/2019 Tumor Marker   Patient's tumor was tested for the following markers: CA-125 Results of the tumor marker test revealed 15.1    Genetic Testing  Negative genetic testing. No pathogenic variants identified on the [facility] CancerNext + RNAinsight panel. VUS in ATM called [id number] identified. The report date is 05/12/2019. TumorNext HRD was originally ordered but there was not enough sample to complete this testing.   The CancerNext+RNAinsight gene panel offered by [facility] includes sequencing and rearrangement analysis for the following 36 genes: APC*, ATM*, AXIN2,  BARD1, BMPR1A, BRCA1*, BRCA2*, BRIP1*, CDH1*, CDK4, CDKN2A, CHEK2*, DICER1, MLH1*, MSH2*, MSH3, MSH6*, MUTYH*, NBN, NF1*, NTHL1, PALB2*, PMS2*, PTEN*, RAD51C*, RAD51D*, RECQL, SMAD4, SMARCA4, STK11 and TP53* (sequencing and deletion/duplication); HOXB13, POLD1 and POLE (sequencing only); EPCAM and GREM1 (deletion/duplication only). DNA and RNA analyses performed for * genes.    05/16/2019 Tumor Marker   Patient's tumor was tested for the following markers: CA-125 Results of the tumor marker test revealed 10.7   06/16/2019 Imaging   1. No evidence of residual or recurrent metastatic disease in the chest, abdomen or pelvis. 2. Masslike focus of consolidation in the right middle lobe along the major and minor fissures with associated volume loss is stable since 01/31/2019. Indolent primary pulmonary neoplasm not excluded. PET-CT may be considered for further characterization. 3. Aortic Atherosclerosis (ICD10-I70.0).   06/16/2019 Tumor Marker   Patient's tumor was tested for the following markers: CA-125 Results of the tumor marker test revealed 9.1.   12/05/2019 Tumor Marker   Patient's tumor was tested for the following markers: CA-125. Results of the tumor marker test revealed 9.0.   02/29/2020 Tumor Marker   Patient's tumor was tested for the following markers: CA-`125 Results of the tumor marker test revealed 9.7.   05/28/2020 Tumor Marker   Patient's tumor was tested for the following markers: CA-125 Results of the tumor marker test revealed 9.5   08/16/2020 Tumor Marker   Patient's tumor was tested for the following markers: CA-125 Results of the tumor marker test revealed 10.9   11/29/2020 Tumor Marker   Patient's tumor was tested for the following markers: CA-125. Results of the tumor marker test revealed 10.2.   01/22/2021 Pathology Results   SURGICAL PATHOLOGY  CASE: [phone]  PATIENT: [name] [name]  Surgical Pathology Report   Clinical History: brain metastasis (cm)     FINAL MICROSCOPIC DIAGNOSIS:   A. 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    03/21/2021 -  Chemotherapy   Patient is on Treatment Plan : OVARIAN RECURRENT 3RD LINE Carboplatin D1 / Gemcitabine D1,8 (4/800) q21d       PHYSICAL EXAMINATION: ECOG PERFORMANCE STATUS: 2 - Symptomatic, <50% confined to bed  Vitals:   02/15/21 1301  BP: (!) 130/56  Pulse: 81  Resp: 18  Temp: (!) 97.4 F (36.3 C)  SpO2: 100%   Filed Weights   02/15/21 1301  Weight: 97 lb 12.8 oz (44.4 kg)    GENERAL:alert, no distress and comfortable NEURO: alert & oriented x 3 with fluent speech, appears anxious.  She repeated several question multiple times and appears to have difficulties retaining information  LABORATORY DATA:  I have reviewed the data as listed    Component Value Date/Time   NA 141 01/22/2021 1339   K 3.2 (L) 01/22/2021 1339   CL 103 12/23/2020 0522   CO2 28 12/23/2020 0522   GLUCOSE 93 12/23/2020 0522   BUN 13 12/23/2020 0522   CREATININE 0.51 01/22/2021 1630   CREATININE 0.64 11/29/2020 1007   CALCIUM  9.7 12/23/2020 0522   PROT 7.6 12/23/2020 0522   ALBUMIN 4.1 12/23/2020 0522   AST 23 12/23/2020 0522   AST 22 11/29/2020 1007   ALT 18 12/23/2020 0522   ALT 13 11/29/2020 1007   ALKPHOS 58 12/23/2020 0522   BILITOT 0.6 12/23/2020 0522   BILITOT 0.5 11/29/2020 1007   GFRNONAA >60 01/22/2021 1630   GFRNONAA >60 11/29/2020 1007   GFRAA >60 12/05/2019 1145    No results found for: SPEP, UPEP  Lab Results  Component Value Date   WBC 6.4 01/22/2021   NEUTROABS 6.7 12/22/2020   HGB 10.5 (L) 01/22/2021   HCT 32.5 (L) 01/22/2021   MCV 93.1 01/22/2021   PLT 189 01/22/2021      Chemistry      Component Value Date/Time   NA 141 01/22/2021 1339   K 3.2 (L) 01/22/2021 1339   CL 103 12/23/2020 0522   CO2 28 12/23/2020 0522   BUN 13  12/23/2020 0522   CREATININE 0.51 01/22/2021 1630   CREATININE 0.64 11/29/2020 1007      Component Value Date/Time   CALCIUM 9.7 12/23/2020 0522   ALKPHOS 58 12/23/2020 0522   AST 23 12/23/2020 0522   AST 22 11/29/2020 1007   ALT 18 12/23/2020 0522   ALT 13 11/29/2020 1007   BILITOT 0.6 12/23/2020 0522   BILITOT 0.5 11/29/2020 1007       RADIOGRAPHIC STUDIES: I have personally reviewed the radiological images as listed and agreed with the findings in the report. MR Brain W Wo Contrast  Result Date: 02/13/2021 CLINICAL DATA:  Brain/CNS neoplasm, assess treatment response. Metastatic serous carcinoma. Resection of a brain metastasis on 01/22/2021. SRS treatment planning. EXAM: MRI HEAD WITHOUT AND WITH CONTRAST TECHNIQUE: Multiplanar, multiecho pulse sequences of the brain and surrounding structures were obtained without and with intravenous contrast. CONTRAST:  81m MULTIHANCE GADOBENATE DIMEGLUMINE 529 MG/ML IV SOLN COMPARISON:  MR head 01/23/2021. FINDINGS: Brain: Sequelae of right frontoparietal craniotomy are again identified with a 2.7 cm resection cavity containing blood products in the posterior right frontal lobe. There is increased, mild enhancement along the margins of the resection cavity, greatest posteriorly where the enhancement measures up to 5 mm in thickness (series 11, image 123). There is mild surrounding edema which has decreased. Pneumocephalus has resolved. A small extra-axial fluid collection subjacent to the craniotomy has slightly decreased in size, and there is no mass effect. Smooth dural enhancement over the right greater than left cerebral convexities is likely postoperative. A 12 mm enhancing lesion in the left parietal lobe is unchanged in size with only minimal edema. No new enhancing lesion is evident. Scattered small T2 hyperintensities in the cerebral white matter bilaterally are similar to the prior MRI and are nonspecific but compatible with mild chronic small  vessel ischemic disease. No acute infarct or significant intracranial mass effect is present. There is mild cerebral atrophy. Vascular: Major intracranial vascular flow voids are preserved. Skull and upper cervical spine: Right frontoparietal craniotomy. No suspicious marrow lesion. Sinuses/Orbits: Unremarkable orbits. Paranasal sinuses and mastoid air cells are clear. Other: None. IMPRESSION: 1. Increased enhancement along the margins of the posterior right frontal resection cavity, greatest posteriorly and potentially reflecting progressive postsurgical changes although a small amount of tumor is also possible. Decreased edema. 2. Unchanged 12 mm left parietal metastasis. 3. No new intracranial metastases identified. Electronically Signed   By: ALogan BoresM.D.   On: 02/13/2021 12:39   MR BRAIN W WO CONTRAST  Result  Date: 01/23/2021 CLINICAL DATA:  Postop assessment of metastatic disease resection. EXAM: MRI HEAD WITHOUT AND WITH CONTRAST TECHNIQUE: Multiplanar, multiecho pulse sequences of the brain and surrounding structures were obtained without and with intravenous contrast. CONTRAST:  4.48m GADAVIST GADOBUTROL 1 MMOL/ML IV SOLN COMPARISON:  Six days ago FINDINGS: Brain: Interval resection of a posterior right frontal metastasis with collapsed cavity containing blood products. Mild adjacent vasogenic edema for recent surgery. No complicating infarct. Pneumocephalus greatest in the right frontal region where there is mild cortical mass effect. No hydrocephalus. Mild reactive dural thickening on the right, expected. Left parietal metastasis measuring 12 mm on coronal postcontrast images. Associated vasogenic edema is mild. Vascular: Major vessels are enhancing. Skull and upper cervical spine: Unremarkable right craniotomy Sinuses/Orbits: Negative IMPRESSION: 1. No complicating features of right-sided mass resection. No visible residual separate from blood products in the resection cavity. 2. Known smaller  left parietal metastasis. 3. Pneumocephalus causing mild right frontal cortical mass effect. Electronically Signed   By: JJorje GuildM.D.   On: 01/23/2021 08:41   MR Brain W Wo Contrast  Result Date: 01/18/2021 CLINICAL DATA:  Brain CNS neoplasm. Staging. SRS protocol. 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 EXAM: MRI HEAD WITHOUT AND WITH CONTRAST TECHNIQUE: Multiplanar, multiecho pulse sequences of the brain and surrounding structures were obtained without and with intravenous contrast. CONTRAST:  951mMULTIHANCE GADOBENATE DIMEGLUMINE 529 MG/ML IV SOLN COMPARISON:  MR head without and with contrast 12/22/2020 FINDINGS: BRAIN New Lesions: None. Larger lesions: None. Untreated enhancing lesion located posterior right frontal lobe has increased from 25 x 22 x 22 mm to now 33 x 26 x 27 mm, with new or increased edema and mass effect seen on series 12, image 116. The lesion is peripherally enhancing and centrally necrotic with more solid tissue posteriorly and superiorly. Minimal susceptibility along the posterior aspect of the lesion is present within the more solid component, potentially representing remote blood products. Untreated enhancing lesion located left parietal lobe has increased from 11 mm to now 12 x 8 x 8 mm, with new or increased edema and mass effect seen on series 12, image 116. The lesion is peripherally enhancing and centrally necrotic with more solid tissue posteriorly and superiorly. Stable or Smaller lesions: None. Other Brain findings: No acute infarct or hemorrhage is present. Other scattered white matter changes stable. The ventricles are of normal size. No significant extraaxial fluid collection is present. The internal auditory canals are within normal limits. The brainstem and cerebellum are within normal limits. Vascular: Flow is present in the major intracranial arteries. Skull and upper cervical spine: Cervical fusion again noted. Craniocervical junction is within normal  limits. Marrow signal is normal. Sinuses/Orbits: The paranasal sinuses and mastoid air cells are clear. The globes and orbits are within normal limits. IMPRESSION: 1. Progression of disease as evidenced by increased size of 2 dominant lesions, measuring 33 x 22 x 27 mm and 12 x 8 x 8 mm respectively. 2. No new lesions. 3. Stable atrophy and white matter disease. This likely reflects the sequela of chronic microvascular ischemia. Electronically Signed   By: ChSan Morelle.D.   On: 01/18/2021 12:39   DG Swallowing Func-Speech Pathology  Result Date: 01/23/2021 Table formatting from the original result was not included. Objective Swallowing Evaluation: Type of Study: MBS-Modified Barium Swallow Study  Completed and documented by AsOletha BlendLP Student Supervised and reviewed by BoHerbie BaltimoreA CCC-SLP Patient Details Name: [name] ARCHBOLDRN: [id number] of Birth:  28-Apr-1950 Today's Date: 01/23/2021 Time: SLP Start Time (ACUTE ONLY): 1332 -SLP Stop Time (ACUTE ONLY): 3903 SLP Time Calculation (min) (ACUTE ONLY): 22 min Past Medical History: Past Medical History: Diagnosis Date  Allergic rhinitis   Cervical dysplasia 1980  Cervicalgia   Eczema   Rosacea,dermatitis-Dr Stinehelfer  Esophageal reflux   Family history of colon cancer   Family history of melanoma   Family history of ovarian cancer   Family history of pancreatic cancer   Family history of prostate cancer   Family history of uterine cancer   Fibromyalgia   GERD (gastroesophageal reflux disease)   Glaucoma, narrow-angle   Heart murmur   Hiatal hernia   Irritable bowel syndrome   LBBB (left bundle branch block)   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), unspecified site   DCIS  Mass of lung   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 Arlyce Dice  Mitral valve disorders(424.0)   No antibiotics required  Osteoporosis 12/2016  T score -2.3, 2014 T score -2.5 AP spine  PAC (premature atrial contraction) 2012  ,PVC's, and nonsustained atril tachycardia w  aberration by heart monitor   PVC (premature ventricular contraction)   Rosacea   Stricture and stenosis of esophagus  Past Surgical History: Past Surgical History: Procedure Laterality Date  APPLICATION OF CRANIAL NAVIGATION N/A 00/11/2328  Procedure: APPLICATION OF CRANIAL NAVIGATION;  Surgeon: Karsten Ro, DO;  Location: [facility];  Service: Neurosurgery;  Laterality: N/A;  BREAST LUMPECTOMY  1998  right breast with radiation  CERVICAL DISCECTOMY    C3-7 with bone graft, in three different surgeries, have titanium plate and screws  CRANIOTOMY N/A 01/22/2021  Procedure: STEREOTACTIC RIGHT FRONTAL CRANIOTOMY FOR RESECTION OF TUMOR;  Surgeon: Karsten Ro, DO;  Location: [place];  Service: Neurosurgery;  Laterality: N/A;  GYNECOLOGIC CRYOSURGERY  1980  IR IMAGING GUIDED PORT INSERTION  12/24/2018  NECK SURGERY  1993-1997  bone graft and fusion  OMENTECTOMY  02/17/2019  REFRACTIVE SURGERY    narrow angle glucoma  RHINOPLASTY  1976  sterotactic large core needle biopsy right breast Right 10/15/1996  TUBAL LIGATION  1977 HPI: 70 y.o. F with PMHx significant for breast cancer s/p lumpectomy and radiation (1998), peritoneal cancer treatment with surgical resection and chemotherapy (2021) presents for right frontal craniotomy for resection of tumor after presenting to ED with 2 focal seizures of the left face revealing multiple intracranial masses, largest in the right frontal region anterior to motor strip.  Subjective: Pt awake and alert  Recommendations for follow up therapy are one component of a multi-disciplinary discharge planning process, led by the attending physician.  Recommendations may be updated based on patient status, additional functional criteria and insurance authorization. Assessment / Plan / Recommendation Clinical Impressions 01/23/2021 Clinical Impression Pt was seen for an MBS. Overall, pt presents with mild pharyngeal dysphagia c/b reduced epiglottic inversion and general muscular weakness resulting  in intermittent penetration above the level of the vocal folds that does not clear (PAS 3) with thin liquids and residue on posterior pharynx, vallecula, and pyriform sinuses across all consistencies. Pt epiglottis inverts, however unable to move past posterior pharyngeal wall to fully cover airway. Penetration with thin liquids is trace and cleared spontaneously with all but 2 trials. Pt exhibited reflexive throat clear x1, clearing penetration independently when it did not spontaneously clear. Pharyngeal residue worsens with increasing consistencies, exbiting greatest residue with regular solids. Pt utilizes the multiple swallows and liquid wash strategies independently and frequently expectorates solid residue from vallecula back into  oral cavity for reswallow. SLP also trialed effortful swallowing, resulting in reduction of pharyngeal residue. Pt educated re: severity of dysphagia and risk factors for future progression of swallowing impairment if medical complications progress. Recommend Dys3/thin liquid diet, administering medications whole with thin liquid. Crush or cut pills if large. Pt requires no further acute SLP services. SLP Visit Diagnosis Dysphagia, pharyngeal phase (R13.13) Attention and concentration deficit following -- Frontal lobe and executive function deficit following -- Impact on safety and function Mild aspiration risk   Treatment Recommendations 01/23/2021 Treatment Recommendations No treatment recommended at this time   Prognosis 01/23/2021 Prognosis for Safe Diet Advancement Good Barriers to Reach Goals -- Barriers/Prognosis Comment -- Diet Recommendations 01/23/2021 SLP Diet Recommendations Dysphagia 3 (Mech soft) solids;Thin liquid Liquid Administration via Cup;Straw Medication Administration Whole meds with liquid Compensations Effortful swallow;Multiple dry swallows after each bite/sip;Follow solids with liquid Postural Changes Remain semi-upright after after feeds/meals  (Comment);Seated upright at 90 degrees   Other Recommendations 01/23/2021 Recommended Consults -- Oral Care Recommendations Oral care BID Other Recommendations -- Follow Up Recommendations No SLP follow up Assistance recommended at discharge PRN Functional Status Assessment Patient has not had a recent decline in their functional status Frequency and Duration  01/23/2021 Speech Therapy Frequency (ACUTE ONLY) min 2x/week Treatment Duration 2 weeks   Oral Phase 01/23/2021 Oral Phase WFL Oral - Pudding Teaspoon -- Oral - Pudding Cup -- Oral - Honey Teaspoon -- Oral - Honey Cup -- Oral - Nectar Teaspoon -- Oral - Nectar Cup -- Oral - Nectar Straw -- Oral - Thin Teaspoon -- Oral - Thin Cup -- Oral - Thin Straw -- Oral - Puree -- Oral - Mech Soft -- Oral - Regular -- Oral - Multi-Consistency -- Oral - Pill -- Oral Phase - Comment --  Pharyngeal Phase 01/23/2021 Pharyngeal Phase Impaired Pharyngeal- Pudding Teaspoon -- Pharyngeal -- Pharyngeal- Pudding Cup -- Pharyngeal -- Pharyngeal- Honey Teaspoon -- Pharyngeal -- Pharyngeal- Honey Cup -- Pharyngeal -- Pharyngeal- Nectar Teaspoon -- Pharyngeal -- Pharyngeal- Nectar Cup -- Pharyngeal -- Pharyngeal- Nectar Straw -- Pharyngeal -- Pharyngeal- Thin Teaspoon -- Pharyngeal -- Pharyngeal- Thin Cup Penetration/Aspiration during swallow;Reduced epiglottic inversion;Pharyngeal residue - posterior pharnyx;Pharyngeal residue - pyriform;Pharyngeal residue - valleculae Pharyngeal Material enters airway, remains ABOVE vocal cords and not ejected out Pharyngeal- Thin Straw Penetration/Aspiration during swallow;Reduced epiglottic inversion;Pharyngeal residue - valleculae;Pharyngeal residue - pyriform;Pharyngeal residue - posterior pharnyx Pharyngeal Material enters airway, remains ABOVE vocal cords and not ejected out Pharyngeal- Puree Reduced epiglottic inversion;Pharyngeal residue - valleculae;Pharyngeal residue - pyriform;Pharyngeal residue - posterior pharnyx;Compensatory strategies  attempted (with notebox) Pharyngeal Material does not enter airway Pharyngeal- Mechanical Soft -- Pharyngeal -- Pharyngeal- Regular Reduced epiglottic inversion;Pharyngeal residue - valleculae;Pharyngeal residue - pyriform;Pharyngeal residue - posterior pharnyx Pharyngeal Material does not enter airway Pharyngeal- Multi-consistency -- Pharyngeal -- Pharyngeal- Pill -- Pharyngeal -- Pharyngeal Comment --  No flowsheet data found. DeBlois, Katherene Ponto 01/23/2021, 3:01 PM

## 2021-02-15 NOTE — Assessment & Plan Note (Signed)
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 I gave her a copy of her pathology report She is currently receiving palliative radiation to the brain I recommend continue on supportive care and radiation treatment this month I plan to repeat CT imaging of the chest, abdomen and pelvis first week of January and see her back with plan to start her on palliative systemic chemotherapy on January 5 I reviewed the guidelines with the patient We discussed the risk, benefits, side effects of combination chemotherapy of carboplatin with gemcitabine, carboplatin with Doxil and carboplatin with paclitaxel with or without bevacizumab I explained to the patient the rationale behind not starting bevacizumab due to recent brain surgery I would probably add bevacizumab after 3 cycles of treatment We discussed the timing of chemotherapy We discussed the rationale behind getting baseline imaging study and repeat another CT imaging after 3 cycles of treatment The patient is noted to have history of cardiomyopathy so combination treatment of carboplatin with Doxil is not desirable 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 2005 May-Jun;15 Suppl 1:36-41. Combination therapy with gemcitabine and carboplatin in recurrent ovarian cancer. Pfisterer J1, [place] I, [place] A, Eisenhauer E; AGO-OVAR,; NCIC CTG; EORTC GCG.  Participants with recurrent platinum-sensitive ovarian cancer were randomly assigned to receive either gemcitabine-carboplatin or carboplatin every 21 days. The primary  objective was to compare progression-free survival (PFS) between the groups. From September 1999 to April 2002, 356 patients (178 participants received gemcitabine-carboplatin, 178 received carboplatin only) were randomized to treatment. Patients received six cycles of either gemcitabine-carboplatin or carboplatin. With a median follow-up of 17 months, median PFS was 8.6 months for gemcitabine-carboplatin (95% confidence interval [CI] 7.9-9.7 months) and 5.8 months for carboplatin (95% CI 5.2-7.1 months; hazard ratio [HR] 0.72 [95% CI 0.58-0.90; P = 0.0032]). The response rate for the gemcitabine-carboplatin group was 47.2% (95% CI 39.9-54.5%) and 30.9% for carboplatin group (95% CI 24.1-37.7%; P = 0.0016). The HR for overall survival was 0.96 (95% CI 0.75-1.23; P = 0.7349). Patients treated with gemcitabine-carboplatin reported significantly faster palliation of abdominal symptoms and a significantly improved global quality of life. Gemcitabine-carboplatin treatment significantly improves the PFS of patients with platinum-sensitive recurrent ovarian cancer.  We discussed the role of chemotherapy. The intent is of palliative intent.  We discussed some of the risks, benefits, side-effects of carboplatin & gemcitabine  Some of the short term side-effects included, though not limited to, including weight loss, life threatening infections, risk of allergic reactions, need for transfusions of blood products, nausea, vomiting, change in bowel habits, loss of hair, admission to hospital for various reasons, and risks of death.   Long term side-effects are also discussed including risks of infertility, permanent damage to nerve function, hearing loss, chronic fatigue, kidney damage with possibility needing hemodialysis, and rare secondary malignancy including bone marrow disorders.  The patient is aware that the response rates discussed earlier is not guaranteed.  After a long discussion, patient made an informed  decision to proceed with the prescribed plan of care.   Patient education material was dispensed I will schedule CT imaging for January and see her back before we start treatment

## 2021-02-15 NOTE — Progress Notes (Signed)
DISCONTINUE ON PATHWAY REGIMEN - Ovarian     A cycle is every 21 days:     Paclitaxel      Carboplatin   **Always confirm dose/schedule in your pharmacy ordering system**  REASON: Continuation Of Treatment PRIOR TREATMENT: OVOS44: Carboplatin AUC=6 + Paclitaxel 175 mg/m2 q21 Days x 2-4 Cycles TREATMENT RESPONSE: Complete Response (CR)  Ovarian - No Medical Intervention - Off Treatment.  Patient Characteristics: Preoperative or Nonsurgical Candidate (Clinical Staging), Newly Diagnosed, Neoadjuvant Therapy followed by Surgery Therapeutic Status: Preoperative or Nonsurgical Candidate (Clinical Staging) AJCC T Category: cT3 AJCC 8 Stage Grouping: Unknown AJCC N Category: cN0 AJCC M Category: cM0 Therapy Plan: Neoadjuvant Therapy followed by Surgery

## 2021-02-15 NOTE — Progress Notes (Signed)
DISCONTINUE ON PATHWAY REGIMEN - Ovarian  No Medical Intervention - Off Treatment.  REASON: Disease Progression PRIOR TREATMENT: Off Treatment  Ovarian - No Medical Intervention - Off Treatment.  Patient Characteristics: Recurrent or Progressive Disease, Second Line, Platinum Sensitive and ? 6 Months Since Last Therapy, Not a Candidate for Secondary Debulking Surgery BRCA Mutation Status: Absent Therapeutic Status: Recurrent or Progressive Disease Line of Therapy: Second Line

## 2021-02-18 ENCOUNTER — Ambulatory Visit
Admission: RE | Admit: 2021-02-18 | Discharge: 2021-02-18 | Disposition: A | Discharge status: Home / Self Care | Payer: Medicare Other | Source: Ambulatory Visit | Attending: Radiation Oncology | Admitting: Radiation Oncology

## 2021-02-18 ENCOUNTER — Telehealth: Payer: Self-pay | Admitting: Hematology and Oncology

## 2021-02-18 ENCOUNTER — Other Ambulatory Visit: Payer: Self-pay

## 2021-02-18 ENCOUNTER — Telehealth: Payer: Self-pay | Admitting: Oncology

## 2021-02-18 VITALS — BP 111/47 | HR 78 | Temp 97.1°F | Resp 20

## 2021-02-18 DIAGNOSIS — C7931 Secondary malignant neoplasm of brain: Secondary | ICD-10-CM

## 2021-02-18 NOTE — Telephone Encounter (Signed)
Called [name] [name] to see how she is doing.  She said she is ok but is anxious about her radiation treatment today.  She said she is feeling well except for worsening vision in her left eye since radiation started.  She has notified her radiation doctor about this.    She is wondering if she will start chemotherapy on 1/5.  She has her CT scheduled on 1/3.  Advised her that I will check to see when it will be scheduled.  She said she will watch MyChart for new appointments.

## 2021-02-18 NOTE — Telephone Encounter (Signed)
Scheduled per 12/02 los, patient has been called and notified.

## 2021-02-18 NOTE — Progress Notes (Signed)
Mrs. Mauzy rested with [name] for 15 minutes following her [place] treatment.  Patient denies headache, dizziness, nausea, diplopia or ringing in the ears. Denies fatigue. Patient without complaints. Understands to avoid strenuous activity for the next 24 hours and call [phone] with needs.   Patient ambulated to waiting room without difficulty.  BP (!) 111/47 (BP Location: Left Arm, Patient Position: Sitting, Cuff Size: Normal)   Pulse 78   Temp (!) 97.1 F (36.2 C)   Resp 20   SpO2 100%    Zaire Levesque M. Leonie Green, BSN

## 2021-02-19 ENCOUNTER — Ambulatory Visit: Payer: Medicare Other | Admitting: Radiation Oncology

## 2021-02-19 ENCOUNTER — Ambulatory Visit: Payer: Medicare Other | Admitting: Gynecologic Oncology

## 2021-02-21 ENCOUNTER — Encounter: Payer: Self-pay | Admitting: Hematology and Oncology

## 2021-02-21 ENCOUNTER — Other Ambulatory Visit: Payer: Self-pay

## 2021-02-21 ENCOUNTER — Ambulatory Visit
Admission: RE | Admit: 2021-02-21 | Discharge: 2021-02-21 | Disposition: A | Discharge status: Home / Self Care | Payer: Medicare Other | Source: Ambulatory Visit | Attending: Radiation Oncology | Admitting: Radiation Oncology

## 2021-02-21 ENCOUNTER — Encounter: Payer: Self-pay | Admitting: Radiation Oncology

## 2021-02-21 VITALS — BP 109/49 | HR 81 | Temp 97.6°F | Resp 18

## 2021-02-21 DIAGNOSIS — C482 Malignant neoplasm of peritoneum, unspecified: Secondary | ICD-10-CM | POA: Diagnosis not present

## 2021-02-21 DIAGNOSIS — C7931 Secondary malignant neoplasm of brain: Secondary | ICD-10-CM | POA: Diagnosis not present

## 2021-02-21 NOTE — Progress Notes (Signed)
[name] [name] rested with [name] for 15 minutes following her [name] treatment.  Patient denies headache, dizziness, nausea, diplopia or ringing in the ears. Denies fatigue. Patient without complaints. Understands to avoid strenuous activity for the next 24 hours and call [phone] with needs.   Patient ambulated to waiting room without difficulty.  Patient aware she will receive a follow-up phone call in about a month.  BP (!) 109/49 (BP Location: Left Arm, Patient Position: Sitting, Cuff Size: Normal)   Pulse 81   Temp 97.6 F (36.4 C)   Resp 18   SpO2 100%    Jacobus Colvin M. Leonie Green, BSN

## 2021-02-22 ENCOUNTER — Encounter: Payer: Self-pay | Admitting: Hematology and Oncology

## 2021-02-22 DIAGNOSIS — Z23 Encounter for immunization: Secondary | ICD-10-CM | POA: Diagnosis not present

## 2021-02-22 NOTE — Op Note (Signed)
Name: VERDIS BASSETTE    MRN: [id number]   Date: 02/14/2021    DOB: Jul 15, 1950   STEREOTACTIC RADIOSURGERY OPERATIVE NOTE  PRE-OPERATIVE DIAGNOSIS:  Metastatic peritoneal cancer, two metastatic lesions  POST-OPERATIVE DIAGNOSIS:  Same  PROCEDURE:  Stereotactic Radiosurgery  SURGEON:  Elwin Sleight, DO  RADIATION ONCOLOGIST: Dr. Lisbeth Renshaw  TECHNIQUE:  The patient underwent a radiation treatment planning session in the radiation oncology simulation suite under the care of the radiation oncology physician and physicist.  I participated closely in the radiation treatment planning afterwards. The patient underwent planning CT which was fused to 3T high resolution MRI with 1 mm axial slices.  These images were fused on the planning system.  We contoured the gross target volumes and subsequently expanded this to yield the Planning Target Volume. I actively participated in the planning process.  I helped to define and review the target contours and also the contours of the optic pathway, eyes, brainstem and selected nearby organs at risk.  All the dose constraints for critical structures were reviewed and compared to AAPM Task Group 101.  The prescription dose conformity was reviewed.  I approved the plan electronically.    Accordingly, Hassan Buckler  was brought to the TrueBeam stereotactic radiation treatment linac and placed in the custom immobilization mask.  The patient was aligned according to the IR fiducial markers with BrainLab Exactrac, then orthogonal x-rays were used in ExacTrac with the 6DOF robotic table and the shifts were made to align the patient.  Hassan Buckler received stereotactic radiosurgery to a prescription dose of 9Gy in 1 fractions to two lesions with plans for further fractionation.    The detailed description of the procedure is recorded in the radiation oncology procedure note.  I was present for the duration of the procedure.  DISPOSITION:   Following delivery, the  patient was transported to nursing in stable condition and monitored for possible acute effects to be discharged to home in stable condition with follow-up in one month.  Elwin Sleight, [facility]

## 2021-03-04 ENCOUNTER — Inpatient Hospital Stay (HOSPITAL_BASED_OUTPATIENT_CLINIC_OR_DEPARTMENT_OTHER): Payer: Medicare Other | Admitting: Internal Medicine

## 2021-03-04 VITALS — BP 133/49 | HR 98 | Temp 97.9°F | Resp 16 | Ht 65.0 in | Wt 97.6 lb

## 2021-03-04 DIAGNOSIS — C482 Malignant neoplasm of peritoneum, unspecified: Secondary | ICD-10-CM | POA: Diagnosis not present

## 2021-03-04 DIAGNOSIS — C7931 Secondary malignant neoplasm of brain: Secondary | ICD-10-CM

## 2021-03-04 NOTE — Progress Notes (Signed)
[facility] [place], [place] [address] [phone]   Interval Evaluation  Date of Service: 03/04/21 Patient Name: [name] [name] Patient MRN: [id number] Patient DOB: Sep 05, 1950 Provider: Ventura Sellers, MD  Identifying Statement:  [name] [name] is a 70 y.o. 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 Findings highly suspicious for peritoneal carcinomatosis. Recommend paracentesis for therapeutic and diagnostic purposes. I do not see an obvious primary lesion but there is some irregular enhancing soft tissue in the right adnexal area. CA 125 level may be helpful. 2. Mild surface irregularity involving the liver but I do not see any obvious changes of cirrhosis. No hepatic lesions.   11/23/2018 Tumor Marker   Patient's tumor was tested for the following markers: CA-125 Results of the tumor marker test revealed 253.   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   12/01/2018 Cancer Staging   Staging form: Ovary, Fallopian Tube, and Primary Peritoneal Carcinoma, AJCC 8th Edition - Clinical stage from 12/01/2018: Stage IVA (rcT3, cN0, pM1a) - Signed by [name], MD on 02/14/2021 Stage prefix: Recurrence    12/02/2018 Tumor Marker   Patient's tumor was tested for the following markers: CA-125 Results of the tumor marker test revealed 267   12/06/2018 - 05/16/2019 Chemotherapy   The patient had carboplatin and taxol for chemotherapy treatment.     12/08/2018 Procedure   Successful ultrasound-guided therapeutic paracentesis yielding 1.7 liters of peritoneal fluid.   12/24/2018 Procedure    Successful placement of a right IJ approach Power Port with ultrasound and fluoroscopic guidance. The catheter is ready for use   01/17/2019 Tumor Marker   Patient's tumor was tested for the following markers: CA-125 Results of the tumor marker test revealed 57.2   01/31/2019 Imaging   Significant decrease in peritoneal carcinomatosis since previous study. Interval resolution of ascites.   Focal area of parenchymal consolidation in central right middle lobe, which measures 3 cm. Differential diagnosis includes infectious or inflammatory process, atelectasis, and neoplasm. Recommend short-term follow-up by chest CT in 2-3 months.     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   Surgeon: Donaciano Eva    Operative Findings:  : grossly normal uterus, ovaries normal, few scattered peritoneal nodules (39mm) on serosa of uterus and tubes. The omentum (gastrocolic) was tethered to the mesentery with tumor (thin rind). Complete (optimal) resection of tumor with no gross residual disease.     02/17/2019 Pathology Results   FINAL MICROSCOPIC DIAGNOSIS: A. UTERUS, CERVIX, BILATERAL FALLOPIAN TUBES AND OVARIES, HYSTERECTOMY WITH SALPINGOOOPHORECTOMY: - Uterus: Endometrium: Inactive endometrium. No hyperplasia or malignancy. Myometrium: Unremarkable. No malignancy. Serosa: Metastatic carcinoma. No malignancy. - Cervix: Benign squamous and endocervical mucosa. No dysplasia or malignancy. - Left ovary and fallopian tube: Metastatic carcinoma. - Right ovary: No malignancy identified. - Right fallopian tube: Luminal tumor, see comment. B. OMENTUM, RESECTION: - High grade serous carcinoma. - Deposits up to at least 3 cm. - See oncology table.  ONCOLOGY  TABLE: OVARY or FALLOPIAN TUBE or PRIMARY PERITONEUM: Procedure: Hysterectomy with bilateral salpingo-oophorectomy and omentectomy. Specimen Integrity: Intact Tumor Site: Peritoneum Ovarian Surface Involvement (required only if applicable): Left ovary Fallopian Tube Surface Involvement (required only if applicable): Left Fallopian tube Tumor Size: Largest deposit 3 cm Histologic Type: High-grade serous carcinoma Histologic Grade: High-grade Implants (required for advanced stage serous/seromucinous borderline tumors only): Uterine serosa, left fallopian tube and ovary, omentum Other Tissue/ Organ Involvement: As above Largest Extrapelvic Peritoneal Focus (required only if applicable): 3 cm Peritoneal/Ascitic Fluid: Pre neoadjuvant [id number] positive for carcinoma. Treatment Effect (required only for high-grade serous carcinomas): Probably partial treatment effect in omental tissue. Regional Lymph Nodes: No lymph nodes submitted or found Pathologic Stage Classification (pTNM, AJCC 8th Edition): ypT3c, ypNX Representative Tumor Block: B5 Comment(s): There is surface involvement of the left ovary and fallopian tube but no primary tumor. Within the right fallopian tube lumen there is detached fragments of tumor but again no precursor lesion is noted in the right fallopian tube. Thus, the tumor is presumed primary peritoneal and staged as such.   03/21/2019 Tumor Marker   Patient's tumor was tested for the following markers: CA-125 Results of the tumor marker test revealed 15.1    Genetic Testing   Negative genetic testing. No pathogenic variants identified on the [facility] CancerNext + RNAinsight panel. VUS in ATM called [id number] identified. The report date is 05/12/2019. TumorNext HRD was originally ordered but there was not enough sample to complete this testing.   The CancerNext+RNAinsight gene panel offered by [employer] includes sequencing and rearrangement analysis for the following 36  genes: APC*, ATM*, AXIN2, BARD1, BMPR1A, BRCA1*, BRCA2*, BRIP1*, CDH1*, CDK4, CDKN2A, CHEK2*, DICER1, MLH1*, MSH2*, MSH3, MSH6*, MUTYH*, NBN, NF1*, NTHL1, PALB2*, PMS2*, PTEN*, RAD51C*, RAD51D*, RECQL, SMAD4, SMARCA4, STK11 and TP53* (sequencing and deletion/duplication); HOXB13, POLD1 and POLE (sequencing only); EPCAM and GREM1 (deletion/duplication only). DNA and RNA analyses performed for * genes.    05/16/2019 Tumor Marker   Patient's tumor was tested for the following markers: CA-125 Results of the tumor marker test revealed 10.7   06/16/2019 Imaging   1. No evidence of residual or recurrent metastatic disease in the chest, abdomen or pelvis. 2. Masslike focus of consolidation in the right middle lobe along the major and minor fissures with associated volume loss is stable since 01/31/2019. Indolent primary pulmonary neoplasm not excluded. PET-CT may be considered for further characterization. 3. Aortic Atherosclerosis (ICD10-I70.0).   06/16/2019 Tumor Marker   Patient's tumor was tested for the following markers: CA-125 Results of the tumor marker test revealed 9.1.   12/05/2019 Tumor Marker   Patient's tumor was tested for the following markers: CA-125. Results of the tumor marker test revealed 9.0.   02/29/2020 Tumor Marker   Patient's tumor was tested for the following markers: CA-`125 Results of the tumor marker test revealed 9.7.   05/28/2020 Tumor Marker   Patient's tumor was tested for the following markers: CA-125 Results of the tumor marker test revealed 9.5   08/16/2020 Tumor Marker   Patient's tumor was tested for the following markers: CA-125 Results of the tumor marker test revealed 10.9   11/29/2020 Tumor Marker   Patient's tumor was tested for the following markers: CA-125. Results of the tumor marker test revealed 10.2.   01/22/2021 Pathology Results   SURGICAL PATHOLOGY  CASE: [phone]  PATIENT: Sampson Goon  Surgical Pathology Report   Clinical History:  brain metastasis (cm)    FINAL MICROSCOPIC DIAGNOSIS:   A.  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    03/21/2021 -  Chemotherapy   Patient is on Treatment Plan : OVARIAN RECURRENT 3RD LINE Carboplatin D1 / Gemcitabine D1,8 (4/800) q21d      CNS Oncologic History 01/22/21: R frontal craniotomy, resection with Dr. Reatha Armour 02/21/21: Post-op SRS and SRS to L parietal target Tammi Klippel)  Interval History: JAMINE WINGATE presents for follow up after radiosurgery.  She describes recurrent episodes of facial numbness or "almost twitching" over the past 1-2 weeks.  Frequency is almost daily.  No frank seizure events, continues on Keppra $RemoveB'1000mg'owmjECZt$  BID.  Balance issues may be somewhat worse from prior.  Also describes impairment in left eye vision, with blurriness only.  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 Venetia Maxon.  Finally, she describes episodes of vertigo when she bends forward, lasts for seconds, then improves on its own.  Planning to start carboplatin and gemcitabine chemotherapy with Dr. Alvy Bimler in 2-3 weeks.  She is off dexamethasone.  H+P (12/25/20) Patient presented to the ED on 12/22/20 with twitching of her left side of her face for 1 to 2 minutes, accompanied by slurring of speech.  There was no tongue biting, bowel or bladder incontinence.  Workup demonstrated two brain lesions consistent with suspected metastases.  She was started on Keppra (no decadron) and discharged two days later with no further seizure like events.  Priro to admission she had also been complaining of dizziness, confusion, impaired memory.  Has been seeing Dr. Alvy Bimler since 2021 for primary peritoneal carcinoma.   Medications: Current Outpatient Medications on File Prior to Visit  Medication Sig Dispense Refill   Azelaic Acid 15 % cream Apply 1 application topically daily.      calcium carbonate (OS-CAL - DOSED IN MG OF ELEMENTAL CALCIUM) 1250 (500 Ca) MG tablet Take 1 tablet by mouth daily with breakfast.     cholecalciferol (VITAMIN D3) 25 MCG (1000 UNIT) tablet Take 2,000 Units by mouth daily.     cromolyn (NASALCROM) 5.2 MG/ACT nasal spray Place 1 spray into the nose 3 (three) times daily as needed for allergies. (Patient not taking: Reported on 01/31/2021)     dexamethasone (DECADRON) 4 MG tablet Take 1 tablet (4 mg total) by mouth daily. (Patient not taking: Reported on 01/31/2021) 30 tablet 1   dexamethasone (DECADRON) 4 MG tablet Take 1 tablet (4 mg total) by mouth 2 (two) times daily with a meal. Take one tablet three times per day for two days, then two tablets per day for two days, then one tablet per day for two days 12 tablet 0   gabapentin (NEURONTIN) 300 MG capsule Take 300-600 mg by mouth See admin instructions. Taking 600 mg in am ;300 mg at lunch; 300 mg at hs.     levETIRAcetam (KEPPRA) 500 MG tablet Take 1 tablet (500 mg total) by mouth 2 (two) times daily. 60 tablet 0   levETIRAcetam (KEPPRA) 500 MG tablet Take 1 tablet (500 mg total) by mouth every 12 (twelve) hours. (Patient not taking: Reported on 01/31/2021) 60 tablet 1   lidocaine-prilocaine (EMLA) cream APPLY TOPICALLY AS NEEDED (PORT ACCESS). 30 g 3   Magnesium 250 MG TABS Take 250 mg by mouth daily.     ondansetron (ZOFRAN) 8 MG tablet Take 1 tablet (8 mg total) by mouth every 8 (eight) hours as needed. 30 tablet  1   Probiotic Product (PROBIOTIC DAILY PO) Take 1 capsule by mouth daily.     prochlorperazine (COMPAZINE) 10 MG tablet Take 1 tablet (10 mg total) by mouth every 6 (six) hours as needed (Nausea or vomiting). 30 tablet 1   risedronate (ACTONEL) 35 MG tablet Take 35 mg by mouth every 7 (seven) days. with water on empty stomach, nothing by mouth or lie down for next 30 minutes. (Patient not taking: Reported on 01/31/2021)     Sulfacetamide Sodium, Acne, 10 % LOTN Apply 1 application  topically at bedtime.     VITAMIN E PO Take 50 Int'l Units/day by mouth daily.     No current facility-administered medications on file prior to visit.    Allergies:  Allergies  Allergen Reactions   Fentanyl Palpitations    Palpitations , fever ,headahe, N/V  " loud pounding heart beat"    Lac Bovis Diarrhea and Nausea And Vomiting   Brimonidine Tartrate     Other reaction(s): blurred vision   Ditropan [Oxybutynin]    Milk-Related Compounds Diarrhea and Nausea And Vomiting   Other Other (See Comments)   Oxybutynin Chloride Other (See Comments)    Ditropan  Pt doesn't remember    Thimerosal Other (See Comments)    Pt doesn't remember   Tramadol Other (See Comments)    Pt doesn't remember   Avelox [Moxifloxacin Hcl In Nacl] Rash   Doxycycline Rash   Erythromycin Rash   Metronidazole Rash   Minocycline Rash   Penicillins Rash    Did it involve swelling of the face/tongue/throat, SOB, or low BP? No Did it involve sudden or severe rash/hives, skin peeling, or any reaction on the inside of your mouth or nose? No Did you need to seek medical attention at a hospital or doctor's office? No When did it last happen?      Childhood allergy If all above answers are NO, may proceed with cephalosporin use.    Quinolones Rash   Sulfonamide Derivatives Rash   Past Medical History:  Past Medical History:  Diagnosis Date   Allergic rhinitis    Cervical dysplasia 1980   Cervicalgia    Eczema    Rosacea,dermatitis-Dr Stinehelfer   Esophageal reflux    Family history of colon cancer    Family history of melanoma    Family history of ovarian cancer    Family history of pancreatic cancer    Family history of prostate cancer    Family history of uterine cancer    Fibromyalgia    GERD (gastroesophageal reflux disease)    Glaucoma, narrow-angle    Heart murmur    Hiatal hernia    Irritable bowel syndrome    LBBB (left bundle branch block)    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),  unspecified site    DCIS   Mass of lung    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 Arlyce Dice   Mitral valve disorders(424.0)    No antibiotics required   Osteoporosis 12/2016   T score -2.3, 2014 T score -2.5 AP spine   PAC (premature atrial contraction) 2012   ,PVC's, and nonsustained atril tachycardia w aberration by heart monitor    PVC (premature ventricular contraction)    Rosacea    Stricture and stenosis of esophagus    Past Surgical History:  Past Surgical History:  Procedure Laterality Date   APPLICATION OF CRANIAL NAVIGATION N/A 01/22/2021   Procedure: APPLICATION OF CRANIAL NAVIGATION;  Surgeon: Karsten Ro, DO;  Location:  [facility];  Service: Neurosurgery;  Laterality: N/A;   BREAST LUMPECTOMY  1998   right breast with radiation   CERVICAL DISCECTOMY     C3-7 with bone graft, in three different surgeries, have titanium plate and screws   CRANIOTOMY N/A 01/22/2021   Procedure: STEREOTACTIC RIGHT FRONTAL CRANIOTOMY FOR RESECTION OF TUMOR;  Surgeon: Karsten Ro, DO;  Location: [facility];  Service: Neurosurgery;  Laterality: N/A;   GYNECOLOGIC CRYOSURGERY  1980   IR IMAGING GUIDED PORT INSERTION  12/24/2018   NECK SURGERY  1993-1997   bone graft and fusion   OMENTECTOMY  02/17/2019   REFRACTIVE SURGERY     narrow angle glucoma   RHINOPLASTY  1976   sterotactic large core needle biopsy right breast Right 10/15/1996   TUBAL LIGATION  1977   Social History:  Social History   Socioeconomic History   Marital status: Married    Spouse name: Antony Haste   Number of children: 0   Years of education: Not on file   Highest education level: Not on file  Occupational History   Occupation: Teacher, early years/pre: [employer]  Tobacco Use   Smoking status: Never   Smokeless tobacco: Never  Vaping Use   Vaping Use: Never used  Substance and Sexual Activity   Alcohol use: Yes    Alcohol/week: 2.0 standard drinks    Types: 2 Standard drinks or equivalent per week    Comment:  Social    Drug use: No   Sexual activity: Not Currently    Birth control/protection: Post-menopausal    Comment: 1st intercourse 70 yo- More than 5 partners  Other Topics Concern   Not on file  Social History Narrative   Not on file   Social Determinants of Health   Financial Resource Strain: Not on file  Food Insecurity: Not on file  Transportation Needs: Not on file  Physical Activity: Not on file  Stress: Not on file  Social Connections: Not on file  Intimate Partner Violence: Not on file   Family History:  Family History  Problem Relation Age of Onset   Heart disease Mother    Depression Mother    Hypertension Mother    Heart disease Father    Hypertension Father    Aneurysm Father    Uterine cancer Sister 3   Hypertension Sister    Ovarian cancer Sister 31   Melanoma Sister 64   Depression Sister    Melanoma Sister 51   Heart disease Paternal Grandfather    Stroke Maternal Grandmother    Cancer Maternal Grandfather 63       Pancreatic   Stroke Paternal Grandmother    Cancer Paternal Uncle        multiple myeloma, prostate cancer and melanoma (separate primaries)   Colon cancer Neg Hx     Review of Systems: Constitutional: Doesn't report fevers, chills or abnormal weight loss Eyes: Doesn't report blurriness of vision Ears, nose, mouth, throat, and face: Doesn't report sore throat Respiratory: Doesn't report cough, dyspnea or wheezes Cardiovascular: Doesn't report palpitation, chest discomfort  Gastrointestinal:  Doesn't report nausea, constipation, diarrhea GU: Doesn't report incontinence Skin: Doesn't report skin rashes Neurological: Per HPI Musculoskeletal: Doesn't report joint pain Behavioral/Psych: Doesn't report anxiety  Physical Exam: Wt Readings from Last 3 Encounters:  02/15/21 97 lb 12.8 oz (44.4 kg)  02/11/21 97 lb 12.8 oz (44.4 kg)  01/31/21 97 lb 6.4 oz (44.2 kg)   Temp Readings from Last 3 Encounters:  02/21/21 97.6 F (36.4 C)   02/18/21 (!) 97.1 F (36.2 C)  02/15/21 (!) 97.4 F (36.3 C) (Tympanic)   BP Readings from Last 3 Encounters:  02/21/21 (!) 109/49  02/18/21 (!) 111/47  02/15/21 (!) 130/56   Pulse Readings from Last 3 Encounters:  02/21/21 81  02/18/21 78  02/15/21 81   KPS: 80. General: Alert, cooperative, pleasant, in no acute distress Head: Normal EENT: No conjunctival injection or scleral icterus.  Lungs: Resp effort normal Cardiac: Regular rate Abdomen: Non-distended abdomen Skin: No rashes cyanosis or petechiae. Extremities: No clubbing or edema  Neurologic Exam: Mental Status: Awake, alert, attentive to examiner. Oriented to self and environment. Language is fluent with intact comprehension.  Cranial Nerves: Visual acuity is grossly normal. Visual fields are full. Extra-ocular movements intact. No ptosis. Face is symmetric Motor: Tone and bulk are normal. Power is full in both arms and legs. Reflexes are symmetric, no pathologic reflexes present.  Sensory: Intact to light touch Gait: Normal.   Labs: I have reviewed the data as listed    Component Value Date/Time   NA 141 01/22/2021 1339   K 3.2 (L) 01/22/2021 1339   CL 103 12/23/2020 0522   CO2 28 12/23/2020 0522   GLUCOSE 93 12/23/2020 0522   BUN 13 12/23/2020 0522   CREATININE 0.51 01/22/2021 1630   CREATININE 0.64 11/29/2020 1007   CALCIUM 9.7 12/23/2020 0522   PROT 7.6 12/23/2020 0522   ALBUMIN 4.1 12/23/2020 0522   AST 23 12/23/2020 0522   AST 22 11/29/2020 1007   ALT 18 12/23/2020 0522   ALT 13 11/29/2020 1007   ALKPHOS 58 12/23/2020 0522   BILITOT 0.6 12/23/2020 0522   BILITOT 0.5 11/29/2020 1007   GFRNONAA >60 01/22/2021 1630   GFRNONAA >60 11/29/2020 1007   GFRAA >60 12/05/2019 1145   Lab Results  Component Value Date   WBC 6.4 01/22/2021   NEUTROABS 6.7 12/22/2020   HGB 10.5 (L) 01/22/2021   HCT 32.5 (L) 01/22/2021   MCV 93.1 01/22/2021   PLT 189 01/22/2021    Imaging:  MR Brain W Wo  Contrast  Result Date: 02/13/2021 CLINICAL DATA:  Brain/CNS neoplasm, assess treatment response. Metastatic serous carcinoma. Resection of a brain metastasis on 01/22/2021. SRS treatment planning. EXAM: MRI HEAD WITHOUT AND WITH CONTRAST TECHNIQUE: Multiplanar, multiecho pulse sequences of the brain and surrounding structures were obtained without and with intravenous contrast. CONTRAST:  49mL MULTIHANCE GADOBENATE DIMEGLUMINE 529 MG/ML IV SOLN COMPARISON:  MR head 01/23/2021. FINDINGS: Brain: Sequelae of right frontoparietal craniotomy are again identified with a 2.7 cm resection cavity containing blood products in the posterior right frontal lobe. There is increased, mild enhancement along the margins of the resection cavity, greatest posteriorly where the enhancement measures up to 5 mm in thickness (series 11, image 123). There is mild surrounding edema which has decreased. Pneumocephalus has resolved. A small extra-axial fluid collection subjacent to the craniotomy has slightly decreased in size, and there is no mass effect. Smooth dural enhancement over the right greater than left cerebral convexities is likely postoperative. A 12 mm enhancing lesion in the left parietal lobe is unchanged in size with only minimal edema. No new enhancing lesion is evident. Scattered small T2 hyperintensities in the cerebral white matter bilaterally are similar to the prior MRI and are nonspecific but compatible with mild chronic small vessel ischemic disease. No acute infarct or significant intracranial mass effect is present. There is mild cerebral atrophy. Vascular: Major intracranial vascular flow  voids are preserved. Skull and upper cervical spine: Right frontoparietal craniotomy. No suspicious marrow lesion. Sinuses/Orbits: Unremarkable orbits. Paranasal sinuses and mastoid air cells are clear. Other: None. IMPRESSION: 1. Increased enhancement along the margins of the posterior right frontal resection cavity, greatest  posteriorly and potentially reflecting progressive postsurgical changes although a small amount of tumor is also possible. Decreased edema. 2. Unchanged 12 mm left parietal metastasis. 3. No new intracranial metastases identified. Electronically Signed   By: Logan Bores M.D.   On: 02/13/2021 12:39      Assessment/Plan Brain metastases ([place])  Hassan Buckler presents today with clinical symptoms likely consistent with acute but mild post radiosurgery CNS inflammation.    We recommended  trial of dexamethasone $RemoveBeforeDE'2mg'lnygtBTgdcAGrxv$  daily as intervention.   For now should continue Keppra $RemoveBeforeD'1000mg'XXsCIUrrFhBjox$  BID for seizures.  We will visit with her again prior to infusion of carbo+gemcitabine on 03/21/21 for further evaluation and titration  We appreciate the opportunity to participate in the care of YASMENE SALOMONE.  She may follow up with our clinic or radiation oncology following post-RT MRI.  All questions were answered. The patient knows to call the clinic with any problems, questions or concerns. No barriers to learning were detected.  The total time spent in the encounter was 30 minutes and more than 50% was on counseling and review of test results   Ventura Sellers, MD Medical Director of Neuro-Oncology [facility] 03/04/21 1:50 PM

## 2021-03-07 ENCOUNTER — Telehealth: Payer: Self-pay

## 2021-03-07 NOTE — Telephone Encounter (Signed)
Returned her call. She is asking if her husband can be with her in the infusion room with next treatment. Told her visitors are not allowed but she can use her phone. She verbalized understanding.

## 2021-03-12 ENCOUNTER — Other Ambulatory Visit: Payer: Self-pay | Admitting: Internal Medicine

## 2021-03-12 NOTE — Telephone Encounter (Signed)
Refilled by Dr. Mickeal Skinner today. Gardiner Rhyme, RN

## 2021-03-14 ENCOUNTER — Telehealth: Payer: Self-pay

## 2021-03-14 NOTE — Telephone Encounter (Signed)
Returned her call. She has chronic congestion and has been testing herself for covid. She has been negative everyday. Her husband tested positive today. She and her husband are both wearing a mask in the house. She is staying as far as she can from him in the house.  She will call back in the am after she tests herself again to get instructions.

## 2021-03-15 ENCOUNTER — Telehealth: Payer: Self-pay

## 2021-03-15 NOTE — Telephone Encounter (Signed)
Returned her call. She did another test for covid today and it was negative. She will keep appts as scheduled and call the office back if needed.

## 2021-03-19 ENCOUNTER — Inpatient Hospital Stay: Payer: Medicare Other | Attending: Gynecologic Oncology

## 2021-03-19 ENCOUNTER — Ambulatory Visit (HOSPITAL_COMMUNITY)
Admission: RE | Admit: 2021-03-19 | Discharge: 2021-03-19 | Disposition: A | Discharge status: Home / Self Care | Payer: Medicare Other | Source: Ambulatory Visit | Attending: Hematology and Oncology | Admitting: Hematology and Oncology

## 2021-03-19 ENCOUNTER — Other Ambulatory Visit: Payer: Self-pay

## 2021-03-19 DIAGNOSIS — R253 Fasciculation: Secondary | ICD-10-CM | POA: Insufficient documentation

## 2021-03-19 DIAGNOSIS — H538 Other visual disturbances: Secondary | ICD-10-CM | POA: Insufficient documentation

## 2021-03-19 DIAGNOSIS — J189 Pneumonia, unspecified organism: Secondary | ICD-10-CM | POA: Diagnosis not present

## 2021-03-19 DIAGNOSIS — Z90710 Acquired absence of both cervix and uterus: Secondary | ICD-10-CM | POA: Diagnosis not present

## 2021-03-19 DIAGNOSIS — Z5111 Encounter for antineoplastic chemotherapy: Secondary | ICD-10-CM | POA: Insufficient documentation

## 2021-03-19 DIAGNOSIS — D61818 Other pancytopenia: Secondary | ICD-10-CM | POA: Insufficient documentation

## 2021-03-19 DIAGNOSIS — R569 Unspecified convulsions: Secondary | ICD-10-CM | POA: Insufficient documentation

## 2021-03-19 DIAGNOSIS — R911 Solitary pulmonary nodule: Secondary | ICD-10-CM | POA: Insufficient documentation

## 2021-03-19 DIAGNOSIS — I700 Atherosclerosis of aorta: Secondary | ICD-10-CM | POA: Diagnosis not present

## 2021-03-19 DIAGNOSIS — C482 Malignant neoplasm of peritoneum, unspecified: Secondary | ICD-10-CM | POA: Diagnosis not present

## 2021-03-19 DIAGNOSIS — R590 Localized enlarged lymph nodes: Secondary | ICD-10-CM | POA: Diagnosis not present

## 2021-03-19 DIAGNOSIS — D6481 Anemia due to antineoplastic chemotherapy: Secondary | ICD-10-CM | POA: Insufficient documentation

## 2021-03-19 DIAGNOSIS — C7931 Secondary malignant neoplasm of brain: Secondary | ICD-10-CM | POA: Insufficient documentation

## 2021-03-19 DIAGNOSIS — G620 Drug-induced polyneuropathy: Secondary | ICD-10-CM | POA: Insufficient documentation

## 2021-03-19 LAB — CBC WITH DIFFERENTIAL (CANCER CENTER ONLY)
Abs Immature Granulocytes: 0.06 10*3/uL (ref 0.00–0.07)
Basophils Absolute: 0 10*3/uL (ref 0.0–0.1)
Basophils Relative: 0 %
Eosinophils Absolute: 0.1 10*3/uL (ref 0.0–0.5)
Eosinophils Relative: 1 %
HCT: 36.6 % (ref 36.0–46.0)
Hemoglobin: 12.3 g/dL (ref 12.0–15.0)
Immature Granulocytes: 1 %
Lymphocytes Relative: 13 %
Lymphs Abs: 1.6 10*3/uL (ref 0.7–4.0)
MCH: 29.7 pg (ref 26.0–34.0)
MCHC: 33.6 g/dL (ref 30.0–36.0)
MCV: 88.4 fL (ref 80.0–100.0)
Monocytes Absolute: 0.4 10*3/uL (ref 0.1–1.0)
Monocytes Relative: 3 %
Neutro Abs: 10.1 10*3/uL — ABNORMAL HIGH (ref 1.7–7.7)
Neutrophils Relative %: 82 %
Platelet Count: 263 10*3/uL (ref 150–400)
RBC: 4.14 MIL/uL (ref 3.87–5.11)
RDW: 15.1 % (ref 11.5–15.5)
WBC Count: 12.3 10*3/uL — ABNORMAL HIGH (ref 4.0–10.5)
nRBC: 0 % (ref 0.0–0.2)

## 2021-03-19 LAB — CMP (CANCER CENTER ONLY)
ALT: 13 U/L (ref 0–44)
AST: 17 U/L (ref 15–41)
Albumin: 4.5 g/dL (ref 3.5–5.0)
Alkaline Phosphatase: 57 U/L (ref 38–126)
Anion gap: 9 (ref 5–15)
BUN: 13 mg/dL (ref 8–23)
CO2: 29 mmol/L (ref 22–32)
Calcium: 9.7 mg/dL (ref 8.9–10.3)
Chloride: 100 mmol/L (ref 98–111)
Creatinine: 0.58 mg/dL (ref 0.44–1.00)
GFR, Estimated: >60 mL/min (ref >60)
Glucose, Bld: 87 mg/dL (ref 70–99)
Potassium: 4 mmol/L (ref 3.5–5.1)
Sodium: 138 mmol/L (ref 135–145)
Total Bilirubin: 0.5 mg/dL (ref 0.3–1.2)
Total Protein: 7.6 g/dL (ref 6.5–8.1)

## 2021-03-19 MED ORDER — HEPARIN SOD (PORK) LOCK FLUSH 100 UNIT/ML IV SOLN
500.0000 [IU] | Freq: Once | INTRAVENOUS | Status: AC
  Administered 2021-03-19: 500 [IU] via INTRAVENOUS

## 2021-03-19 MED ORDER — SODIUM CHLORIDE 0.9% FLUSH
10.0000 mL | Freq: Once | INTRAVENOUS | Status: AC
  Administered 2021-03-19: 10 mL

## 2021-03-19 MED ORDER — IOHEXOL 350 MG/ML SOLN
75.0000 mL | Freq: Once | INTRAVENOUS | Status: AC | PRN
  Administered 2021-03-19: 75 mL via INTRAVENOUS

## 2021-03-19 MED ORDER — HEPARIN SOD (PORK) LOCK FLUSH 100 UNIT/ML IV SOLN
INTRAVENOUS | Status: AC
  Filled 2021-03-19: qty 5

## 2021-03-20 MED FILL — Fosaprepitant Dimeglumine For IV Infusion 150 MG (Base Eq): INTRAVENOUS | Qty: 5 | Status: AC

## 2021-03-20 MED FILL — Dexamethasone Sodium Phosphate Inj 100 MG/10ML: INTRAMUSCULAR | Qty: 1 | Status: AC

## 2021-03-21 ENCOUNTER — Inpatient Hospital Stay: Payer: Medicare Other

## 2021-03-21 ENCOUNTER — Inpatient Hospital Stay (HOSPITAL_BASED_OUTPATIENT_CLINIC_OR_DEPARTMENT_OTHER): Payer: Medicare Other | Admitting: Internal Medicine

## 2021-03-21 ENCOUNTER — Encounter: Payer: Medicare Other | Admitting: Nutrition

## 2021-03-21 ENCOUNTER — Inpatient Hospital Stay (HOSPITAL_BASED_OUTPATIENT_CLINIC_OR_DEPARTMENT_OTHER): Payer: Medicare Other | Admitting: Hematology and Oncology

## 2021-03-21 ENCOUNTER — Encounter: Payer: Self-pay | Admitting: Hematology and Oncology

## 2021-03-21 ENCOUNTER — Ambulatory Visit: Payer: Medicare Other

## 2021-03-21 ENCOUNTER — Ambulatory Visit: Payer: Medicare Other | Admitting: Hematology and Oncology

## 2021-03-21 ENCOUNTER — Other Ambulatory Visit: Payer: Self-pay

## 2021-03-21 VITALS — BP 112/48 | HR 70 | Temp 98.9°F | Resp 18 | Wt 100.7 lb

## 2021-03-21 DIAGNOSIS — R911 Solitary pulmonary nodule: Secondary | ICD-10-CM

## 2021-03-21 DIAGNOSIS — C7931 Secondary malignant neoplasm of brain: Secondary | ICD-10-CM | POA: Diagnosis not present

## 2021-03-21 DIAGNOSIS — G620 Drug-induced polyneuropathy: Secondary | ICD-10-CM | POA: Diagnosis not present

## 2021-03-21 DIAGNOSIS — C482 Malignant neoplasm of peritoneum, unspecified: Secondary | ICD-10-CM | POA: Diagnosis not present

## 2021-03-21 DIAGNOSIS — Z5111 Encounter for antineoplastic chemotherapy: Secondary | ICD-10-CM | POA: Diagnosis not present

## 2021-03-21 DIAGNOSIS — D6481 Anemia due to antineoplastic chemotherapy: Secondary | ICD-10-CM | POA: Diagnosis not present

## 2021-03-21 DIAGNOSIS — R253 Fasciculation: Secondary | ICD-10-CM | POA: Diagnosis not present

## 2021-03-21 DIAGNOSIS — H538 Other visual disturbances: Secondary | ICD-10-CM | POA: Diagnosis not present

## 2021-03-21 DIAGNOSIS — R569 Unspecified convulsions: Secondary | ICD-10-CM | POA: Diagnosis not present

## 2021-03-21 DIAGNOSIS — D61818 Other pancytopenia: Secondary | ICD-10-CM | POA: Diagnosis not present

## 2021-03-21 LAB — CBC WITH DIFFERENTIAL (CANCER CENTER ONLY)
Abs Immature Granulocytes: 0.03 10*3/uL (ref 0.00–0.07)
Basophils Absolute: 0 10*3/uL (ref 0.0–0.1)
Basophils Relative: 0 %
Eosinophils Absolute: 0.2 10*3/uL (ref 0.0–0.5)
Eosinophils Relative: 2 %
HCT: 34.6 % — ABNORMAL LOW (ref 36.0–46.0)
Hemoglobin: 11.6 g/dL — ABNORMAL LOW (ref 12.0–15.0)
Immature Granulocytes: 0 %
Lymphocytes Relative: 30 %
Lymphs Abs: 2.8 10*3/uL (ref 0.7–4.0)
MCH: 30.1 pg (ref 26.0–34.0)
MCHC: 33.5 g/dL (ref 30.0–36.0)
MCV: 89.9 fL (ref 80.0–100.0)
Monocytes Absolute: 0.7 10*3/uL (ref 0.1–1.0)
Monocytes Relative: 7 %
Neutro Abs: 5.4 10*3/uL (ref 1.7–7.7)
Neutrophils Relative %: 61 %
Platelet Count: 238 10*3/uL (ref 150–400)
RBC: 3.85 MIL/uL — ABNORMAL LOW (ref 3.87–5.11)
RDW: 15.1 % (ref 11.5–15.5)
WBC Count: 9.1 10*3/uL (ref 4.0–10.5)
nRBC: 0 % (ref 0.0–0.2)

## 2021-03-21 LAB — CMP (CANCER CENTER ONLY)
ALT: 29 U/L (ref 0–44)
AST: 26 U/L (ref 15–41)
Albumin: 4.2 g/dL (ref 3.5–5.0)
Alkaline Phosphatase: 52 U/L (ref 38–126)
Anion gap: 7 (ref 5–15)
BUN: 12 mg/dL (ref 8–23)
CO2: 30 mmol/L (ref 22–32)
Calcium: 9.3 mg/dL (ref 8.9–10.3)
Chloride: 103 mmol/L (ref 98–111)
Creatinine: 0.5 mg/dL (ref 0.44–1.00)
GFR, Estimated: >60 mL/min (ref >60)
Glucose, Bld: 84 mg/dL (ref 70–99)
Potassium: 3.4 mmol/L — ABNORMAL LOW (ref 3.5–5.1)
Sodium: 140 mmol/L (ref 135–145)
Total Bilirubin: 0.5 mg/dL (ref 0.3–1.2)
Total Protein: 7 g/dL (ref 6.5–8.1)

## 2021-03-21 MED ORDER — SODIUM CHLORIDE 0.9 % IV SOLN
800.0000 mg/m2 | Freq: Once | INTRAVENOUS | Status: AC
  Administered 2021-03-21: 1140 mg via INTRAVENOUS
  Filled 2021-03-21: qty 29.98

## 2021-03-21 MED ORDER — SODIUM CHLORIDE 0.9% FLUSH
10.0000 mL | Freq: Once | INTRAVENOUS | Status: AC
  Administered 2021-03-21: 10 mL

## 2021-03-21 MED ORDER — SODIUM CHLORIDE 0.9 % IV SOLN
10.0000 mg | Freq: Once | INTRAVENOUS | Status: AC
  Administered 2021-03-21: 10 mg via INTRAVENOUS
  Filled 2021-03-21: qty 10

## 2021-03-21 MED ORDER — SODIUM CHLORIDE 0.9 % IV SOLN: Freq: Once | INTRAVENOUS | Status: AC

## 2021-03-21 MED ORDER — SODIUM CHLORIDE 0.9 % IV SOLN
150.0000 mg | Freq: Once | INTRAVENOUS | Status: AC
  Administered 2021-03-21: 150 mg via INTRAVENOUS
  Filled 2021-03-21: qty 150

## 2021-03-21 MED ORDER — SODIUM CHLORIDE 0.9 % IV SOLN
242.5730 mg | Freq: Once | INTRAVENOUS | Status: AC
  Administered 2021-03-21: 240 mg via INTRAVENOUS
  Filled 2021-03-21: qty 24

## 2021-03-21 MED ORDER — DEXAMETHASONE 1 MG PO TABS: 1.0000 mg | ORAL_TABLET | Freq: Every day | ORAL | 1 refills | Status: DC

## 2021-03-21 MED ORDER — PALONOSETRON HCL INJECTION 0.25 MG/5ML
0.2500 mg | Freq: Once | INTRAVENOUS | Status: AC
  Administered 2021-03-21: 0.25 mg via INTRAVENOUS
  Filled 2021-03-21: qty 5

## 2021-03-21 NOTE — Progress Notes (Signed)
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[facility]) I have reviewed multiple imaging studies with the patient She is still recovering from radiation treatment but is ready to pursue chemotherapy We discussed the risk, benefits, side effects of chemotherapy with and without bevacizumab She is not ready to start bevacizumab due to recent brain surgery We will proceed with chemotherapy only Side effects were discussed with the patient Her blood counts are stable I will see her again next week for further follow-up  Lesion of lung She had lesions in the right lung for a long time Observe only  Brain metastases ([place]) She tolerated recent dexamethasone taper well Changes in vision and balance are likely due to her recent treatment She has appointment to see her ophthalmologist for management of blurriness of vision  No orders of the defined types were placed in this encounter.   All questions were answered. The patient knows to call the clinic with any problems, questions or concerns. The total time spent in the appointment was 30 minutes encounter with patients including review of chart and various tests results, discussions about plan of care and coordination of care plan   [name], MD 03/21/2021 2:32 PM  INTERVAL HISTORY: Please see below for problem oriented charting. she returns for pretreatment evaluation prior to chemotherapy She is able to tolerate dexamethasone taper No recent headaches of seizure She is complaining of persistent difficulties with balance and noticed changes in her vision No recent falls Denies recent changes in her bowel habits or appetite Her weight is stable She had questions related to the use of bevacizumab  REVIEW OF  SYSTEMS:   Constitutional: Denies fevers, chills or abnormal weight loss Ears, nose, mouth, throat, and face: Denies mucositis or sore throat Respiratory: Denies cough, dyspnea or wheezes Cardiovascular: Denies palpitation, chest discomfort or lower extremity swelling Gastrointestinal:  Denies nausea, heartburn or change in bowel habits Skin: Denies abnormal skin rashes Lymphatics: Denies new lymphadenopathy or easy bruising Behavioral/Psych: Mood is stable, no new changes  All other systems were reviewed with the patient and are negative.  I have reviewed the past medical history, past surgical history, social history and family history with the patient and they are unchanged from previous note.  ALLERGIES:  is allergic to fentanyl, vancomycin, lac bovis, brimonidine tartrate, ditropan [oxybutynin], milk-related compounds, other, oxybutynin chloride, thimerosal, tramadol, avelox [moxifloxacin hcl in nacl], doxycycline, erythromycin, metronidazole, minocycline, penicillins, quinolones, and sulfonamide derivatives.  MEDICATIONS:  Current Outpatient Medications  Medication Sig Dispense Refill   Azelaic Acid 15 % cream Apply 1 application topically daily.     cholecalciferol (VITAMIN D3) 25 MCG (1000 UNIT) tablet Take 2,000 Units by mouth daily.     dexamethasone (DECADRON) 1 MG tablet Take 1 tablet (1 mg total) by mouth daily with breakfast. 60 tablet 1   gabapentin (NEURONTIN) 300 MG capsule Take 300-600 mg by mouth See admin instructions. Taking 600 mg in am ;300 mg at lunch; 300 mg at hs.     levETIRAcetam (KEPPRA) 500 MG tablet TAKE 2 TABLETS BY MOUTH TWICE DAILY 120 tablet 1   Magnesium 250 MG TABS Take 250 mg by mouth daily.     ondansetron (ZOFRAN) 8 MG tablet Take 1 tablet (8 mg total) by mouth every 8 (eight) hours as  needed. 30 tablet 1   Probiotic Product (PROBIOTIC DAILY PO) Take 1 capsule by mouth daily.     prochlorperazine (COMPAZINE) 10 MG tablet Take 1 tablet (10 mg total) by  mouth every 6 (six) hours as needed (Nausea or vomiting). 30 tablet 1   risedronate (ACTONEL) 35 MG tablet Take 35 mg by mouth every 7 (seven) days. with water on empty stomach, nothing by mouth or lie down for next 30 minutes. (Patient not taking: Reported on 01/31/2021)     Sulfacetamide Sodium, Acne, 10 % LOTN Apply 1 application topically at bedtime.     VITAMIN E PO Take 50 Int'l Units/day by mouth daily.     No current facility-administered medications for this visit.    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 Findings highly suspicious for peritoneal carcinomatosis. Recommend paracentesis for therapeutic and diagnostic purposes. I do not see an obvious primary lesion but there is some irregular enhancing soft tissue in the right adnexal area. CA 125 level may be helpful. 2. Mild surface irregularity involving the liver but I do not see any obvious changes of cirrhosis. No hepatic lesions.   11/23/2018 Tumor Marker   Patient's tumor was tested for the following markers: CA-125 Results of the tumor marker test revealed 253.   11/25/2018 Initial Diagnosis   Peritoneal carcinoma ([place])   11/25/2018 Imaging   US paracentesis Successful ultrasound-guided paracentesis yielding 3.4 L of peritoneal fluid.   12/01/2018 Cancer Staging   Staging form: Ovary, Fallopian Tube, and Primary Peritoneal Carcinoma, AJCC 8th Edition - Clinical stage from 12/01/2018: Stage IVA (rcT3, cN0, pM1a) - Signed by [name], MD on 02/14/2021 Stage prefix: Recurrence    12/02/2018 Tumor Marker   Patient's tumor was tested for the following markers: CA-125 Results of the tumor marker test revealed 267   12/06/2018 - 05/16/2019 Chemotherapy   The patient had carboplatin and taxol for chemotherapy treatment.     12/08/2018 Procedure   Successful  ultrasound-guided therapeutic paracentesis yielding 1.7 liters of peritoneal fluid.   12/24/2018 Procedure   Successful placement of a right IJ approach Power Port with ultrasound and fluoroscopic guidance. The catheter is ready for use   01/17/2019 Tumor Marker   Patient's tumor was tested for the following markers: CA-125 Results of the tumor marker test revealed 57.2   01/31/2019 Imaging   Significant decrease in peritoneal carcinomatosis since previous study. Interval resolution of ascites.   Focal area of parenchymal consolidation in central right middle lobe, which measures 3 cm. Differential diagnosis includes infectious or inflammatory process, atelectasis, and neoplasm. Recommend short-term follow-up by chest CT in 2-3 months.     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   Surgeon: Donaciano Eva    Operative Findings:  : grossly normal uterus, ovaries normal, few scattered peritoneal nodules (36m) on serosa of uterus and tubes. The omentum (gastrocolic) was tethered to the mesentery with tumor (thin rind). Complete (optimal) resection of tumor with no gross residual disease.     02/17/2019 Pathology Results   FINAL MICROSCOPIC DIAGNOSIS: A. UTERUS, CERVIX, BILATERAL FALLOPIAN TUBES AND OVARIES, HYSTERECTOMY WITH SALPINGOOOPHORECTOMY: - Uterus: Endometrium: Inactive endometrium.  No hyperplasia or malignancy. Myometrium: Unremarkable. No malignancy. Serosa: Metastatic carcinoma. No malignancy. - Cervix: Benign squamous and endocervical mucosa. No dysplasia or malignancy. - Left ovary and fallopian tube: Metastatic carcinoma. - Right ovary: No malignancy identified. - Right fallopian tube: Luminal tumor, see comment. B. OMENTUM,  RESECTION: - High grade serous carcinoma. - Deposits up to at least 3 cm. - See oncology table. ONCOLOGY TABLE: OVARY or FALLOPIAN TUBE or PRIMARY PERITONEUM: Procedure: Hysterectomy with bilateral salpingo-oophorectomy and omentectomy. Specimen Integrity: Intact Tumor Site: Peritoneum Ovarian Surface Involvement (required only if applicable): Left ovary Fallopian Tube Surface Involvement (required only if applicable): Left Fallopian tube Tumor Size: Largest deposit 3 cm Histologic Type: High-grade serous carcinoma Histologic Grade: High-grade Implants (required for advanced stage serous/seromucinous borderline tumors only): Uterine serosa, left fallopian tube and ovary, omentum Other Tissue/ Organ Involvement: As above Largest Extrapelvic Peritoneal Focus (required only if applicable): 3 cm Peritoneal/Ascitic Fluid: Pre neoadjuvant [id number] positive for carcinoma. Treatment Effect (required only for high-grade serous carcinomas): Probably partial treatment effect in omental tissue. Regional Lymph Nodes: No lymph nodes submitted or found Pathologic Stage Classification (pTNM, AJCC 8th Edition): ypT3c, ypNX Representative Tumor Block: B5 Comment(s): There is surface involvement of the left ovary and fallopian tube but no primary tumor. Within the right fallopian tube lumen there is detached fragments of tumor but again no precursor lesion is noted in the right fallopian tube. Thus, the tumor is presumed primary peritoneal and staged as such.   03/21/2019 Tumor Marker   Patient's tumor was tested for the following markers: CA-125 Results of the tumor marker test revealed 15.1    Genetic Testing   Negative genetic testing. No pathogenic variants identified on the [facility] CancerNext + RNAinsight panel. VUS in ATM called [id number] identified. The report date is 05/12/2019. TumorNext HRD was originally ordered but there was not enough sample to complete this testing.   The CancerNext+RNAinsight  gene panel offered by [employer] includes sequencing and rearrangement analysis for the following 36 genes: APC*, ATM*, AXIN2, BARD1, BMPR1A, BRCA1*, BRCA2*, BRIP1*, CDH1*, CDK4, CDKN2A, CHEK2*, DICER1, MLH1*, MSH2*, MSH3, MSH6*, MUTYH*, NBN, NF1*, NTHL1, PALB2*, PMS2*, PTEN*, RAD51C*, RAD51D*, RECQL, SMAD4, SMARCA4, STK11 and TP53* (sequencing and deletion/duplication); HOXB13, POLD1 and POLE (sequencing only); EPCAM and GREM1 (deletion/duplication only). DNA and RNA analyses performed for * genes.    05/16/2019 Tumor Marker   Patient's tumor was tested for the following markers: CA-125 Results of the tumor marker test revealed 10.7   06/16/2019 Imaging   1. No evidence of residual or recurrent metastatic disease in the chest, abdomen or pelvis. 2. Masslike focus of consolidation in the right middle lobe along the major and minor fissures with associated volume loss is stable since 01/31/2019. Indolent primary pulmonary neoplasm not excluded. PET-CT may be considered for further characterization. 3. Aortic Atherosclerosis (ICD10-I70.0).   06/16/2019 Tumor Marker   Patient's tumor was tested for the following markers: CA-125 Results of the tumor marker test revealed 9.1.   12/05/2019 Tumor Marker   Patient's tumor was tested for the following markers: CA-125. Results of the tumor marker test revealed 9.0.   02/29/2020 Tumor Marker   Patient's tumor was tested for the following markers: CA-`125 Results of the tumor marker test revealed 9.7.   05/28/2020 Tumor Marker   Patient's tumor was tested for the following markers: CA-125 Results of the tumor marker test revealed 9.5   08/16/2020 Tumor Marker   Patient's tumor was tested for the following markers: CA-125 Results of the  tumor marker test revealed 10.9   11/29/2020 Tumor Marker   Patient's tumor was tested for the following markers: CA-125. Results of the tumor marker test revealed 10.2.   01/22/2021 Pathology Results   SURGICAL  PATHOLOGY  CASE: [id number]  PATIENT: Sampson Goon  Surgical Pathology Report   Clinical History: brain metastasis (cm)    FINAL MICROSCOPIC DIAGNOSIS:   A. 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    03/20/2021 Imaging   1. Unchanged enlarged midline superior mediastinal and left retroperitoneal lymph nodes, which remain suspicious for metastatic disease. 2. Unchanged masslike consolidation and volume loss of the right middle lobe measuring 3.8 x 1.9 cm, likely chronic scarring. Continued attention on follow-up. 3. No new evidence of metastatic disease in the chest, abdomen, or pelvis. 4. Status post hysterectomy and omentectomy.   Aortic Atherosclerosis (ICD10-I70.0).     03/21/2021 -  Chemotherapy   Patient is on Treatment Plan : OVARIAN RECURRENT 3RD LINE Carboplatin D1 / Gemcitabine D1,8 (4/800) q21d       PHYSICAL EXAMINATION: ECOG PERFORMANCE STATUS: 1 - Symptomatic but completely ambulatory  Vitals:   03/21/21 1018  BP: 104/82  Pulse: 71  Resp: 17  Temp: 97.9 F (36.6 C)  SpO2: 100%   Filed Weights   03/21/21 1018  Weight: 99 lb 12.8 oz (45.3 kg)    GENERAL:alert, no distress and comfortable  NEURO: alert & oriented x 3 with fluent speech, no focal motor/sensory deficits  LABORATORY DATA:  I have reviewed the data as listed    Component Value Date/Time   NA 140 03/21/2021 1002   K 3.4 (L) 03/21/2021 1002   CL 103 03/21/2021 1002   CO2 30 03/21/2021 1002   GLUCOSE 84 03/21/2021 1002   BUN 12 03/21/2021 1002   CREATININE 0.50 03/21/2021 1002   CALCIUM 9.3 03/21/2021 1002   PROT 7.0 03/21/2021 1002   ALBUMIN 4.2 03/21/2021 1002   AST 26 03/21/2021 1002   ALT 29 03/21/2021 1002   ALKPHOS 52 03/21/2021 1002   BILITOT 0.5 03/21/2021 1002   GFRNONAA >60 03/21/2021 1002   GFRAA >60 12/05/2019 1145    No results  found for: SPEP, UPEP  Lab Results  Component Value Date   WBC 9.1 03/21/2021   NEUTROABS 5.4 03/21/2021   HGB 11.6 (L) 03/21/2021   HCT 34.6 (L) 03/21/2021   MCV 89.9 03/21/2021   PLT 238 03/21/2021      Chemistry      Component Value Date/Time   NA 140 03/21/2021 1002   K 3.4 (L) 03/21/2021 1002   CL 103 03/21/2021 1002   CO2 30 03/21/2021 1002   BUN 12 03/21/2021 1002   CREATININE 0.50 03/21/2021 1002      Component Value Date/Time   CALCIUM 9.3 03/21/2021 1002   ALKPHOS 52 03/21/2021 1002   AST 26 03/21/2021 1002   ALT 29 03/21/2021 1002   BILITOT 0.5 03/21/2021 1002       RADIOGRAPHIC STUDIES: I have reviewed multiple imaging studies with the patient I have personally reviewed the radiological images as listed and agreed with the findings in the report. CT CHEST ABDOMEN PELVIS W CONTRAST  Result Date: 03/20/2021 CLINICAL DATA:  Metastatic primary peritoneal carcinoma, status post hysterectomy and omentectomy EXAM: CT CHEST, ABDOMEN, AND PELVIS WITH CONTRAST TECHNIQUE: Multidetector CT imaging of the chest, abdomen and pelvis was  performed following the standard protocol during bolus administration of intravenous contrast. CONTRAST:  21m OMNIPAQUE IOHEXOL 350 MG/ML SOLN, additional oral enteric contrast COMPARISON:  12/22/2020 FINDINGS: CT CHEST FINDINGS Cardiovascular: Right chest port catheter. Normal heart size. No pericardial effusion. Mediastinum/Nodes: Unchanged enlarged midline superior mediastinal lymph node measuring 1.1 x 1.0 cm (series 2, image 17). No other enlarged mediastinal, hilar, or axillary lymph nodes. Thyroid gland, trachea, and esophagus demonstrate no significant findings. Lungs/Pleura: Unchanged masslike consolidation and volume loss of the right middle lobe measuring 3.8 x 1.9 cm (series 4, image 84). No pleural effusion or pneumothorax. Musculoskeletal: No chest wall mass or suspicious bone lesions identified. CT ABDOMEN PELVIS FINDINGS Hepatobiliary:  No solid liver abnormality is seen. No gallstones, gallbladder wall thickening, or biliary dilatation. Pancreas: Unremarkable. No pancreatic ductal dilatation or surrounding inflammatory changes. Spleen: Normal in size without significant abnormality. Adrenals/Urinary Tract: Adrenal glands are unremarkable. Kidneys are normal, without renal calculi, solid lesion, or hydronephrosis. Bladder is unremarkable. Stomach/Bowel: Stomach is within normal limits. Status post omentectomy. Appendix appears normal. No evidence of bowel wall thickening, distention, or inflammatory changes. Vascular/Lymphatic: Aortic atherosclerosis. Unchanged enlarged left retroperitoneal lymph node measuring 2.2 x 1.5 cm (series 2, image 66). Reproductive: Status post hysterectomy. Other: No abdominal wall hernia or abnormality. No abdominopelvic ascites. Musculoskeletal: No acute or significant osseous findings. IMPRESSION: 1. Unchanged enlarged midline superior mediastinal and left retroperitoneal lymph nodes, which remain suspicious for metastatic disease. 2. Unchanged masslike consolidation and volume loss of the right middle lobe measuring 3.8 x 1.9 cm, likely chronic scarring. Continued attention on follow-up. 3. No new evidence of metastatic disease in the chest, abdomen, or pelvis. 4. Status post hysterectomy and omentectomy. Aortic Atherosclerosis (ICD10-I70.0). Electronically Signed   By: ADelanna AhmadiM.D.   On: 03/20/2021 08:16

## 2021-03-21 NOTE — Progress Notes (Signed)
[facility] [place], [place] [address] [phone]   Interval Evaluation  Date of Service: 03/21/21 Patient Name: [name] [name] Patient MRN: [id number] Patient DOB: 03-19-1950 Provider: Ventura Sellers, MD  Identifying Statement:  [name] [name] is a 71 y.o. 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 Findings highly suspicious for peritoneal carcinomatosis. Recommend paracentesis for therapeutic and diagnostic purposes. I do not see an obvious primary lesion but there is some irregular enhancing soft tissue in the right adnexal area. CA 125 level may be helpful. 2. Mild surface irregularity involving the liver but I do not see any obvious changes of cirrhosis. No hepatic lesions.   11/23/2018 Tumor Marker   Patient's tumor was tested for the following markers: CA-125 Results of the tumor marker test revealed 253.   11/25/2018 Initial Diagnosis   Peritoneal carcinoma ([facility])   11/25/2018 Imaging   US paracentesis Successful ultrasound-guided paracentesis yielding 3.4 L of peritoneal fluid.   12/01/2018 Cancer Staging   Staging form: Ovary, Fallopian Tube, and Primary Peritoneal Carcinoma, AJCC 8th Edition - Clinical stage from 12/01/2018: Stage IVA (rcT3, cN0, pM1a) - Signed by [name], MD on 02/14/2021 Stage prefix: Recurrence    12/02/2018 Tumor Marker   Patient's tumor was tested for the following markers: CA-125 Results of the tumor marker test revealed 267   12/06/2018 - 05/16/2019 Chemotherapy   The patient had carboplatin and taxol for chemotherapy treatment.     12/08/2018 Procedure   Successful ultrasound-guided therapeutic paracentesis yielding 1.7 liters of peritoneal fluid.   12/24/2018 Procedure    Successful placement of a right IJ approach Power Port with ultrasound and fluoroscopic guidance. The catheter is ready for use   01/17/2019 Tumor Marker   Patient's tumor was tested for the following markers: CA-125 Results of the tumor marker test revealed 57.2   01/31/2019 Imaging   Significant decrease in peritoneal carcinomatosis since previous study. Interval resolution of ascites.   Focal area of parenchymal consolidation in central right middle lobe, which measures 3 cm. Differential diagnosis includes infectious or inflammatory process, atelectasis, and neoplasm. Recommend short-term follow-up by chest CT in 2-3 months.     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   Surgeon: Donaciano Eva    Operative Findings:  : grossly normal uterus, ovaries normal, few scattered peritoneal nodules (29m) on serosa of uterus and tubes. The omentum (gastrocolic) was tethered to the mesentery with tumor (thin rind). Complete (optimal) resection of tumor with no gross residual disease.     02/17/2019 Pathology Results   FINAL MICROSCOPIC DIAGNOSIS: A. UTERUS, CERVIX, BILATERAL FALLOPIAN TUBES AND OVARIES, HYSTERECTOMY WITH SALPINGOOOPHORECTOMY: - Uterus: Endometrium: Inactive endometrium. No hyperplasia or malignancy. Myometrium: Unremarkable. No malignancy. Serosa: Metastatic carcinoma. No malignancy. - Cervix: Benign squamous and endocervical mucosa. No dysplasia or malignancy. - Left ovary and fallopian tube: Metastatic carcinoma. - Right ovary: No malignancy identified. - Right fallopian tube: Luminal tumor, see comment. B. OMENTUM, RESECTION: - High grade serous carcinoma. - Deposits up to at least 3 cm. - See oncology table.  ONCOLOGY  TABLE: OVARY or FALLOPIAN TUBE or PRIMARY PERITONEUM: Procedure: Hysterectomy with bilateral salpingo-oophorectomy and omentectomy. Specimen Integrity: Intact Tumor Site: Peritoneum Ovarian Surface Involvement (required only if applicable): Left ovary Fallopian Tube Surface Involvement (required only if applicable): Left Fallopian tube Tumor Size: Largest deposit 3 cm Histologic Type: High-grade serous carcinoma Histologic Grade: High-grade Implants (required for advanced stage serous/seromucinous borderline tumors only): Uterine serosa, left fallopian tube and ovary, omentum Other Tissue/ Organ Involvement: As above Largest Extrapelvic Peritoneal Focus (required only if applicable): 3 cm Peritoneal/Ascitic Fluid: Pre neoadjuvant [id number] positive for carcinoma. Treatment Effect (required only for high-grade serous carcinomas): Probably partial treatment effect in omental tissue. Regional Lymph Nodes: No lymph nodes submitted or found Pathologic Stage Classification (pTNM, AJCC 8th Edition): ypT3c, ypNX Representative Tumor Block: B5 Comment(s): There is surface involvement of the left ovary and fallopian tube but no primary tumor. Within the right fallopian tube lumen there is detached fragments of tumor but again no precursor lesion is noted in the right fallopian tube. Thus, the tumor is presumed primary peritoneal and staged as such.   03/21/2019 Tumor Marker   Patient's tumor was tested for the following markers: CA-125 Results of the tumor marker test revealed 15.1    Genetic Testing   Negative genetic testing. No pathogenic variants identified on the [facility] CancerNext + RNAinsight panel. VUS in ATM called [id number] identified. The report date is 05/12/2019. TumorNext HRD was originally ordered but there was not enough sample to complete this testing.   The CancerNext+RNAinsight gene panel offered by [employer] includes sequencing and rearrangement analysis for the following 36  genes: APC*, ATM*, AXIN2, BARD1, BMPR1A, BRCA1*, BRCA2*, BRIP1*, CDH1*, CDK4, CDKN2A, CHEK2*, DICER1, MLH1*, MSH2*, MSH3, MSH6*, MUTYH*, NBN, NF1*, NTHL1, PALB2*, PMS2*, PTEN*, RAD51C*, RAD51D*, RECQL, SMAD4, SMARCA4, STK11 and TP53* (sequencing and deletion/duplication); HOXB13, POLD1 and POLE (sequencing only); EPCAM and GREM1 (deletion/duplication only). DNA and RNA analyses performed for * genes.    05/16/2019 Tumor Marker   Patient's tumor was tested for the following markers: CA-125 Results of the tumor marker test revealed 10.7   06/16/2019 Imaging   1. No evidence of residual or recurrent metastatic disease in the chest, abdomen or pelvis. 2. Masslike focus of consolidation in the right middle lobe along the major and minor fissures with associated volume loss is stable since 01/31/2019. Indolent primary pulmonary neoplasm not excluded. PET-CT may be considered for further characterization. 3. Aortic Atherosclerosis (ICD10-I70.0).   06/16/2019 Tumor Marker   Patient's tumor was tested for the following markers: CA-125 Results of the tumor marker test revealed 9.1.   12/05/2019 Tumor Marker   Patient's tumor was tested for the following markers: CA-125. Results of the tumor marker test revealed 9.0.   02/29/2020 Tumor Marker   Patient's tumor was tested for the following markers: CA-`125 Results of the tumor marker test revealed 9.7.   05/28/2020 Tumor Marker   Patient's tumor was tested for the following markers: CA-125 Results of the tumor marker test revealed 9.5   08/16/2020 Tumor Marker   Patient's tumor was tested for the following markers: CA-125 Results of the tumor marker test revealed 10.9   11/29/2020 Tumor Marker   Patient's tumor was tested for the following markers: CA-125. Results of the tumor marker test revealed 10.2.   01/22/2021 Pathology Results   SURGICAL PATHOLOGY  CASE: [phone]  PATIENT: Sampson Goon  Surgical Pathology Report   Clinical History:  brain metastasis (cm)    FINAL MICROSCOPIC DIAGNOSIS:   A.  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    03/20/2021 Imaging   1. Unchanged enlarged midline superior mediastinal and left retroperitoneal lymph nodes, which remain suspicious for metastatic disease. 2. Unchanged masslike consolidation and volume loss of the right middle lobe measuring 3.8 x 1.9 cm, likely chronic scarring. Continued attention on follow-up. 3. No new evidence of metastatic disease in the chest, abdomen, or pelvis. 4. Status post hysterectomy and omentectomy.   Aortic Atherosclerosis (ICD10-I70.0).     03/21/2021 -  Chemotherapy   Patient is on Treatment Plan : OVARIAN RECURRENT 3RD LINE Carboplatin D1 / Gemcitabine D1,8 (4/800) q21d      CNS Oncologic History 01/22/21: R frontal craniotomy, resection with Dr. Reatha Armour 02/21/21: Post-op SRS and SRS to L parietal target Tammi Klippel)  Interval History: [name] [name] presents for follow up.  She describes improvement in recurrent episodes of facial numbness or "almost twitching" since starting the steroids pills. Still no frank seizure events, continues on Keppra 1042m BID.  Planning to start carboplatin and gemcitabine chemotherapy with Dr. GAlvy Bimlertoday.    H+P (12/25/20) Patient presented to the ED on 12/22/20 with twitching of her left side of her face for 1 to 2 minutes, accompanied by slurring of speech.  There was no tongue biting, bowel or bladder incontinence.  Workup demonstrated two brain lesions consistent with suspected metastases.  She was started on Keppra (no decadron) and discharged two days later with no further seizure like events.  Priro to admission she had also been complaining of dizziness, confusion, impaired memory.  Has been seeing Dr. GAlvy Bimlersince 2021 for primary peritoneal carcinoma.    Medications: Current Outpatient Medications on File Prior to Visit  Medication Sig Dispense Refill   Azelaic Acid 15 % cream Apply 1 application topically daily.     cholecalciferol (VITAMIN D3) 25 MCG (1000 UNIT) tablet Take 2,000 Units by mouth daily.     cromolyn (NASALCROM) 5.2 MG/ACT nasal spray Place 1 spray into the nose 3 (three) times daily as needed for allergies. (Patient not taking: Reported on 01/31/2021)     gabapentin (NEURONTIN) 300 MG capsule Take 300-600 mg by mouth See admin instructions. Taking 600 mg in am ;300 mg at lunch; 300 mg at hs.     levETIRAcetam (KEPPRA) 1000 MG tablet Take 1,000 mg by mouth 2 (two) times daily.     levETIRAcetam (KEPPRA) 500 MG tablet TAKE 2 TABLETS BY MOUTH TWICE DAILY 120 tablet 1   Magnesium 250 MG TABS Take 250 mg by mouth daily.     ondansetron (ZOFRAN) 8 MG tablet Take 1 tablet (8 mg total) by mouth every 8 (eight) hours as needed. 30 tablet 1   Probiotic Product (PROBIOTIC DAILY PO) Take 1 capsule by mouth daily.     prochlorperazine (COMPAZINE) 10 MG tablet Take 1 tablet (10 mg total) by mouth every 6 (six) hours as needed (Nausea or vomiting). 30 tablet 1   risedronate (ACTONEL) 35 MG tablet Take 35 mg by mouth every 7 (seven) days. with water on empty stomach, nothing by mouth or lie down for next 30 minutes. (Patient not taking: Reported on 01/31/2021)     Sulfacetamide Sodium, Acne, 10 % LOTN Apply 1 application topically at bedtime.     VITAMIN E PO Take 50 Int'l Units/day by mouth daily.     No current facility-administered medications on  file prior to visit.    Allergies:  Allergies  Allergen Reactions   Fentanyl Palpitations    Palpitations , fever ,headahe, N/V  " loud pounding heart beat"    Vancomycin Rash   Lac Bovis Diarrhea and Nausea And Vomiting   Brimonidine Tartrate     Other reaction(s): blurred vision   Ditropan [Oxybutynin]    Milk-Related Compounds Diarrhea and Nausea And Vomiting   Other Other (See  Comments)   Oxybutynin Chloride Other (See Comments)    Ditropan  Pt doesn't remember    Thimerosal Other (See Comments)    Pt doesn't remember   Tramadol Other (See Comments)    Pt doesn't remember   Avelox [Moxifloxacin Hcl In Nacl] Rash   Doxycycline Rash   Erythromycin Rash   Metronidazole Rash   Minocycline Rash   Penicillins Rash    Did it involve swelling of the face/tongue/throat, SOB, or low BP? No Did it involve sudden or severe rash/hives, skin peeling, or any reaction on the inside of your mouth or nose? No Did you need to seek medical attention at a hospital or doctor's office? No When did it last happen?      Childhood allergy If all above answers are NO, may proceed with cephalosporin use.    Quinolones Rash   Sulfonamide Derivatives Rash   Past Medical History:  Past Medical History:  Diagnosis Date   Allergic rhinitis    Cervical dysplasia 1980   Cervicalgia    Eczema    Rosacea,dermatitis-Dr Stinehelfer   Esophageal reflux    Family history of colon cancer    Family history of melanoma    Family history of ovarian cancer    Family history of pancreatic cancer    Family history of prostate cancer    Family history of uterine cancer    Fibromyalgia    GERD (gastroesophageal reflux disease)    Glaucoma, narrow-angle    Heart murmur    Hiatal hernia    Irritable bowel syndrome    LBBB (left bundle branch block)    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), unspecified site    DCIS   Mass of lung    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;  Service: Neurosurgery;  Laterality: N/A;   BREAST LUMPECTOMY  1998   right breast with radiation   CERVICAL DISCECTOMY     C3-7 with bone graft, in three different surgeries, have titanium plate and screws   CRANIOTOMY N/A 01/22/2021   Procedure: STEREOTACTIC RIGHT FRONTAL CRANIOTOMY FOR RESECTION OF TUMOR;  Surgeon: Karsten Ro, DO;  Location: [facility];  Service: Neurosurgery;  Laterality: N/A;   GYNECOLOGIC CRYOSURGERY  1980   IR IMAGING GUIDED PORT INSERTION  12/24/2018   NECK SURGERY  1993-1997   bone graft and fusion   OMENTECTOMY  02/17/2019   REFRACTIVE SURGERY     narrow angle  glucoma   RHINOPLASTY  1976   sterotactic large core needle biopsy right breast Right 10/15/1996   TUBAL LIGATION  1977   Social History:  Social History   Socioeconomic History   Marital status: Married    Spouse name: Antony Haste   Number of children: 0   Years of education: Not on file   Highest education level: Not on file  Occupational History   Occupation: Teacher, early years/pre: [employer]  Tobacco Use   Smoking status: Never   Smokeless tobacco: Never  Vaping Use   Vaping Use: Never used  Substance and Sexual Activity   Alcohol use: Yes    Alcohol/week: 2.0 standard drinks    Types: 2 Standard drinks or equivalent per week    Comment: Social    Drug use: No   Sexual activity: Not Currently    Birth control/protection: Post-menopausal    Comment: 1st intercourse 71 yo- More than 5 partners  Other Topics Concern   Not on file  Social History Narrative   Not on file   Social Determinants of Health   Financial Resource Strain: Not on file  Food Insecurity: Not on file  Transportation Needs: Not on file  Physical Activity: Not on file  Stress: Not on file  Social Connections: Not on file  Intimate Partner Violence: Not on  file   Family History:  Family History  Problem Relation Age of Onset   Heart disease Mother    Depression Mother    Hypertension Mother    Heart disease Father    Hypertension Father    Aneurysm Father    Uterine cancer Sister 53   Hypertension Sister    Ovarian cancer Sister 14   Melanoma Sister 58   Depression Sister    Melanoma Sister 62   Heart disease Paternal Grandfather    Stroke Maternal Grandmother    Cancer Maternal Grandfather 50       Pancreatic   Stroke Paternal Grandmother    Cancer Paternal Uncle        multiple myeloma, prostate cancer and melanoma (separate primaries)   Colon cancer Neg Hx     Review of Systems: Constitutional: Doesn't report fevers, chills or abnormal weight loss Eyes: Doesn't report blurriness of vision Ears, nose, mouth, throat, and face: Doesn't report sore throat Respiratory: Doesn't report cough, dyspnea or wheezes Cardiovascular: Doesn't report palpitation, chest discomfort  Gastrointestinal:  Doesn't report nausea, constipation, diarrhea GU: Doesn't report incontinence Skin: Doesn't report skin rashes Neurological: Per HPI Musculoskeletal: Doesn't report joint pain Behavioral/Psych: Doesn't report anxiety  Physical Exam: Wt Readings from Last 3 Encounters:  03/04/21 97 lb 9.6 oz (44.3 kg)  02/15/21 97 lb 12.8 oz (44.4 kg)  02/11/21 97 lb 12.8 oz (44.4 kg)   Temp Readings from Last 3 Encounters:  03/04/21 97.9 F (36.6 C) (Temporal)  02/21/21 97.6 F (36.4 C)  02/18/21 (!) 97.1 F (36.2 C)   BP Readings from Last 3 Encounters:  03/04/21 (!) 133/49  02/21/21 (!) 109/49  02/18/21 (!) 111/47   Pulse Readings from Last 3 Encounters:  03/04/21 98  02/21/21 81  02/18/21 78   KPS: 80. General: Alert, cooperative, pleasant, in no acute distress Head: Normal EENT: No conjunctival injection or scleral icterus.  Lungs: Resp effort normal Cardiac: Regular rate Abdomen: Non-distended abdomen Skin: No rashes cyanosis  or petechiae. Extremities: No clubbing or edema  Neurologic Exam: Mental Status: Awake, alert, attentive to examiner. Oriented to self and  environment. Language is fluent with intact comprehension.  Cranial Nerves: Visual acuity is grossly normal. Visual fields are full. Extra-ocular movements intact. No ptosis. Face is symmetric Motor: Tone and bulk are normal. Power is full in both arms and legs. Reflexes are symmetric, no pathologic reflexes present.  Sensory: Intact to light touch Gait: Normal.   Labs: I have reviewed the data as listed    Component Value Date/Time   NA 138 03/19/2021 0951   K 4.0 03/19/2021 0951   CL 100 03/19/2021 0951   CO2 29 03/19/2021 0951   GLUCOSE 87 03/19/2021 0951   BUN 13 03/19/2021 0951   CREATININE 0.58 03/19/2021 0951   CALCIUM 9.7 03/19/2021 0951   PROT 7.6 03/19/2021 0951   ALBUMIN 4.5 03/19/2021 0951   AST 17 03/19/2021 0951   ALT 13 03/19/2021 0951   ALKPHOS 57 03/19/2021 0951   BILITOT 0.5 03/19/2021 0951   GFRNONAA >60 03/19/2021 0951   GFRAA >60 12/05/2019 1145   Lab Results  Component Value Date   WBC 12.3 (H) 03/19/2021   NEUTROABS 10.1 (H) 03/19/2021   HGB 12.3 03/19/2021   HCT 36.6 03/19/2021   MCV 88.4 03/19/2021   PLT 263 03/19/2021    Imaging:  CT CHEST ABDOMEN PELVIS W CONTRAST  Result Date: 03/20/2021 CLINICAL DATA:  Metastatic primary peritoneal carcinoma, status post hysterectomy and omentectomy EXAM: CT CHEST, ABDOMEN, AND PELVIS WITH CONTRAST TECHNIQUE: Multidetector CT imaging of the chest, abdomen and pelvis was performed following the standard protocol during bolus administration of intravenous contrast. CONTRAST:  74m OMNIPAQUE IOHEXOL 350 MG/ML SOLN, additional oral enteric contrast COMPARISON:  12/22/2020 FINDINGS: CT CHEST FINDINGS Cardiovascular: Right chest port catheter. Normal heart size. No pericardial effusion. Mediastinum/Nodes: Unchanged enlarged midline superior mediastinal lymph node measuring 1.1 x  1.0 cm (series 2, image 17). No other enlarged mediastinal, hilar, or axillary lymph nodes. Thyroid gland, trachea, and esophagus demonstrate no significant findings. Lungs/Pleura: Unchanged masslike consolidation and volume loss of the right middle lobe measuring 3.8 x 1.9 cm (series 4, image 84). No pleural effusion or pneumothorax. Musculoskeletal: No chest wall mass or suspicious bone lesions identified. CT ABDOMEN PELVIS FINDINGS Hepatobiliary: No solid liver abnormality is seen. No gallstones, gallbladder wall thickening, or biliary dilatation. Pancreas: Unremarkable. No pancreatic ductal dilatation or surrounding inflammatory changes. Spleen: Normal in size without significant abnormality. Adrenals/Urinary Tract: Adrenal glands are unremarkable. Kidneys are normal, without renal calculi, solid lesion, or hydronephrosis. Bladder is unremarkable. Stomach/Bowel: Stomach is within normal limits. Status post omentectomy. Appendix appears normal. No evidence of bowel wall thickening, distention, or inflammatory changes. Vascular/Lymphatic: Aortic atherosclerosis. Unchanged enlarged left retroperitoneal lymph node measuring 2.2 x 1.5 cm (series 2, image 66). Reproductive: Status post hysterectomy. Other: No abdominal wall hernia or abnormality. No abdominopelvic ascites. Musculoskeletal: No acute or significant osseous findings. IMPRESSION: 1. Unchanged enlarged midline superior mediastinal and left retroperitoneal lymph nodes, which remain suspicious for metastatic disease. 2. Unchanged masslike consolidation and volume loss of the right middle lobe measuring 3.8 x 1.9 cm, likely chronic scarring. Continued attention on follow-up. 3. No new evidence of metastatic disease in the chest, abdomen, or pelvis. 4. Status post hysterectomy and omentectomy. Aortic Atherosclerosis (ICD10-I70.0). Electronically Signed   By: ADelanna AhmadiM.D.   On: 03/20/2021 08:16      Assessment/Plan Brain metastases ([facility])  [name] SUKUPis clinically improved today with corticosteroids.  Recommended decreasing decadron to 171mdaily once feeling back to baseline from carbo+gemcitabine infusion.  For now should  continue Keppra 109m BID for seizures.  We appreciate the opportunity to participate in the care of JPETRONELLA [name]  We ask that JLORAN AUGUSTEreturn to clinic in 2 months following next brain MRI, or sooner as needed.  All questions were answered. The patient knows to call the clinic with any problems, questions or concerns. No barriers to learning were detected.  The total time spent in the encounter was 30 minutes and more than 50% was on counseling and review of test results   ZVentura Sellers MD Medical Director of Neuro-Oncology [facility]01/05/23 8:59 AM

## 2021-03-21 NOTE — Assessment & Plan Note (Signed)
She had lesions in the right lung for a long time Observe only

## 2021-03-21 NOTE — Progress Notes (Signed)
Patient tolerated 1st time gemcitabine well with no issues, ambulatory to lobby.

## 2021-03-21 NOTE — Assessment & Plan Note (Signed)
She tolerated recent dexamethasone taper well Changes in vision and balance are likely due to her recent treatment She has appointment to see her ophthalmologist for management of blurriness of vision

## 2021-03-21 NOTE — Assessment & Plan Note (Signed)
I have reviewed multiple imaging studies with the patient She is still recovering from radiation treatment but is ready to pursue chemotherapy We discussed the risk, benefits, side effects of chemotherapy with and without bevacizumab She is not ready to start bevacizumab due to recent brain surgery We will proceed with chemotherapy only Side effects were discussed with the patient Her blood counts are stable I will see her again next week for further follow-up

## 2021-03-21 NOTE — Patient Instructions (Signed)
[facility]  Discharge Instructions: Thank you for choosing [facility] to provide your oncology and hematology care.   If you have a lab appointment with the [facility], please go directly to the [facility] and check in at the registration area.   Wear comfortable clothing and clothing appropriate for easy access to any Portacath or PICC line.   We strive to give you quality time with your provider. You may need to reschedule your appointment if you arrive late (15 or more minutes).  Arriving late affects you and other patients whose appointments are after yours.  Also, if you miss three or more appointments without notifying the office, you may be dismissed from the clinic at the providers discretion.      For prescription refill requests, have your pharmacy contact our office and allow 72 hours for refills to be completed.    Today you received the following chemotherapy and/or immunotherapy agents: Gemzar And Carboplatin   To help prevent nausea and vomiting after your treatment, we encourage you to take your nausea medication as directed.  BELOW ARE SYMPTOMS THAT SHOULD BE REPORTED IMMEDIATELY: *FEVER GREATER THAN 100.4 F (38 C) OR HIGHER *CHILLS OR SWEATING *NAUSEA AND VOMITING THAT IS NOT CONTROLLED WITH YOUR NAUSEA MEDICATION *UNUSUAL SHORTNESS OF BREATH *UNUSUAL BRUISING OR BLEEDING *URINARY PROBLEMS (pain or burning when urinating, or frequent urination) *BOWEL PROBLEMS (unusual diarrhea, constipation, pain near the anus) TENDERNESS IN MOUTH AND THROAT WITH OR WITHOUT PRESENCE OF ULCERS (sore throat, sores in mouth, or a toothache) UNUSUAL RASH, SWELLING OR PAIN  UNUSUAL VAGINAL DISCHARGE OR ITCHING   Items with * indicate a potential emergency and should be followed up as soon as possible or go to the Emergency Department if any problems should occur.  Please show the CHEMOTHERAPY ALERT CARD or IMMUNOTHERAPY ALERT CARD at  check-in to the Emergency Department and triage nurse.  Should you have questions after your visit or need to cancel or reschedule your appointment, please contact [facility]  Dept: [phone]  and follow the prompts.  Office hours are 8:00 a.m. to 4:30 p.m. Monday - Friday. Please note that voicemails left after 4:00 p.m. may not be returned until the following business day.  We are closed weekends and major holidays. You have access to a nurse at all times for urgent questions. Please call the main number to the clinic Dept: [phone] and follow the prompts.   For any non-urgent questions, you may also contact your provider using MyChart. We now offer e-Visits for anyone 19 and older to request care online for non-urgent symptoms. For details visit [email or web address].   Also download the MyChart app! Go to the app store, search "MyChart", open the app, select [facility], and log in with your MyChart username and password.  Due to Covid, a mask is required upon entering the hospital/clinic. If you do not have a mask, one will be given to you upon arrival. For doctor visits, patients may have 1 support person aged 35 or older with them. For treatment visits, patients cannot have anyone with them due to current Covid guidelines and our immunocompromised population.   Gemcitabine injection What is this medication? GEMCITABINE (jem SYE ta been) is a chemotherapy drug. This medicine is used to treat many types of cancer like breast cancer, lung cancer, pancreatic cancer, and ovarian cancer. This medicine may be used for other purposes; ask your health care provider or pharmacist  if you have questions. COMMON BRAND NAME(S): Gemzar, Infugem What should I tell my care team before I take this medication? They need to know if you have any of these conditions: blood disorders infection kidney disease liver disease lung or breathing disease, like asthma recent or  ongoing radiation therapy an unusual or allergic reaction to gemcitabine, other chemotherapy, other medicines, foods, dyes, or preservatives pregnant or trying to get pregnant breast-feeding How should I use this medication? This drug is given as an infusion into a vein. It is administered in a hospital or clinic by a specially trained health care professional. Talk to your pediatrician regarding the use of this medicine in children. Special care may be needed. Overdosage: If you think you have taken too much of this medicine contact a poison control center or emergency room at once. NOTE: This medicine is only for you. Do not share this medicine with others. What if I miss a dose? It is important not to miss your dose. Call your doctor or health care professional if you are unable to keep an appointment. What may interact with this medication? medicines to increase blood counts like filgrastim, pegfilgrastim, sargramostim some other chemotherapy drugs like cisplatin vaccines Talk to your doctor or health care professional before taking any of these medicines: acetaminophen aspirin ibuprofen ketoprofen naproxen This list may not describe all possible interactions. Give your health care provider a list of all the medicines, herbs, non-prescription drugs, or dietary supplements you use. Also tell them if you smoke, drink alcohol, or use illegal drugs. Some items may interact with your medicine. What should I watch for while using this medication? Visit your doctor for checks on your progress. This drug may make you feel generally unwell. This is not uncommon, as chemotherapy can affect healthy cells as well as cancer cells. Report any side effects. Continue your course of treatment even though you feel ill unless your doctor tells you to stop. In some cases, you may be given additional medicines to help with side effects. Follow all directions for their use. Call your doctor or health care  professional for advice if you get a fever, chills or sore throat, or other symptoms of a cold or flu. Do not treat yourself. This drug decreases your body's ability to fight infections. Try to avoid being around people who are sick. This medicine may increase your risk to bruise or bleed. Call your doctor or health care professional if you notice any unusual bleeding. Be careful brushing and flossing your teeth or using a toothpick because you may get an infection or bleed more easily. If you have any dental work done, tell your dentist you are receiving this medicine. Avoid taking products that contain aspirin, acetaminophen, ibuprofen, naproxen, or ketoprofen unless instructed by your doctor. These medicines may hide a fever. Do not become pregnant while taking this medicine or for 6 months after stopping it. Women should inform their doctor if they wish to become pregnant or think they might be pregnant. Men should not father a child while taking this medicine and for 3 months after stopping it. There is a potential for serious side effects to an unborn child. Talk to your health care professional or pharmacist for more information. Do not breast-feed an infant while taking this medicine or for at least 1 week after stopping it. Men should inform their doctors if they wish to father a child. This medicine may lower sperm counts. Talk with your doctor or health care professional  if you are concerned about your fertility. What side effects may I notice from receiving this medication? Side effects that you should report to your doctor or health care professional as soon as possible: allergic reactions like skin rash, itching or hives, swelling of the face, lips, or tongue breathing problems pain, redness, or irritation at site where injected signs and symptoms of a dangerous change in heartbeat or heart rhythm like chest pain; dizziness; fast or irregular heartbeat; palpitations; feeling faint or  lightheaded, falls; breathing problems signs of decreased platelets or bleeding - bruising, pinpoint red spots on the skin, black, tarry stools, blood in the urine signs of decreased red blood cells - unusually weak or tired, feeling faint or lightheaded, falls signs of infection - fever or chills, cough, sore throat, pain or difficulty passing urine signs and symptoms of kidney injury like trouble passing urine or change in the amount of urine signs and symptoms of liver injury like dark yellow or brown urine; general ill feeling or flu-like symptoms; light-colored stools; loss of appetite; nausea; right upper belly pain; unusually weak or tired; yellowing of the eyes or skin swelling of ankles, feet, hands Side effects that usually do not require medical attention (report to your doctor or health care professional if they continue or are bothersome): constipation diarrhea hair loss loss of appetite nausea rash vomiting This list may not describe all possible side effects. Call your doctor for medical advice about side effects. You may report side effects to FDA at 1-800-FDA-1088. Where should I keep my medication? This drug is given in a hospital or clinic and will not be stored at home. NOTE: This sheet is a summary. It may not cover all possible information. If you have questions about this medicine, talk to your doctor, pharmacist, or health care provider.  2022 Elsevier/Gold Standard (2017-05-27 00:00:00)  Carboplatin injection What is this medication? CARBOPLATIN (KAR boe pla tin) is a chemotherapy drug. It targets fast dividing cells, like cancer cells, and causes these cells to die. This medicine is used to treat ovarian cancer and many other cancers. This medicine may be used for other purposes; ask your health care provider or pharmacist if you have questions. COMMON BRAND NAME(S): Paraplatin What should I tell my care team before I take this medication? They need to know if  you have any of these conditions: blood disorders hearing problems kidney disease recent or ongoing radiation therapy an unusual or allergic reaction to carboplatin, cisplatin, other chemotherapy, other medicines, foods, dyes, or preservatives pregnant or trying to get pregnant breast-feeding How should I use this medication? This drug is usually given as an infusion into a vein. It is administered in a hospital or clinic by a specially trained health care professional. Talk to your pediatrician regarding the use of this medicine in children. Special care may be needed. Overdosage: If you think you have taken too much of this medicine contact a poison control center or emergency room at once. NOTE: This medicine is only for you. Do not share this medicine with others. What if I miss a dose? It is important not to miss a dose. Call your doctor or health care professional if you are unable to keep an appointment. What may interact with this medication? medicines for seizures medicines to increase blood counts like filgrastim, pegfilgrastim, sargramostim some antibiotics like amikacin, gentamicin, neomycin, streptomycin, tobramycin vaccines Talk to your doctor or health care professional before taking any of these medicines: acetaminophen aspirin ibuprofen ketoprofen naproxen  This list may not describe all possible interactions. Give your health care provider a list of all the medicines, herbs, non-prescription drugs, or dietary supplements you use. Also tell them if you smoke, drink alcohol, or use illegal drugs. Some items may interact with your medicine. What should I watch for while using this medication? Your condition will be monitored carefully while you are receiving this medicine. You will need important blood work done while you are taking this medicine. This drug may make you feel generally unwell. This is not uncommon, as chemotherapy can affect healthy cells as well as cancer  cells. Report any side effects. Continue your course of treatment even though you feel ill unless your doctor tells you to stop. In some cases, you may be given additional medicines to help with side effects. Follow all directions for their use. Call your doctor or health care professional for advice if you get a fever, chills or sore throat, or other symptoms of a cold or flu. Do not treat yourself. This drug decreases your body's ability to fight infections. Try to avoid being around people who are sick. This medicine may increase your risk to bruise or bleed. Call your doctor or health care professional if you notice any unusual bleeding. Be careful brushing and flossing your teeth or using a toothpick because you may get an infection or bleed more easily. If you have any dental work done, tell your dentist you are receiving this medicine. Avoid taking products that contain aspirin, acetaminophen, ibuprofen, naproxen, or ketoprofen unless instructed by your doctor. These medicines may hide a fever. Do not become pregnant while taking this medicine. Women should inform their doctor if they wish to become pregnant or think they might be pregnant. There is a potential for serious side effects to an unborn child. Talk to your health care professional or pharmacist for more information. Do not breast-feed an infant while taking this medicine. What side effects may I notice from receiving this medication? Side effects that you should report to your doctor or health care professional as soon as possible: allergic reactions like skin rash, itching or hives, swelling of the face, lips, or tongue signs of infection - fever or chills, cough, sore throat, pain or difficulty passing urine signs of decreased platelets or bleeding - bruising, pinpoint red spots on the skin, black, tarry stools, nosebleeds signs of decreased red blood cells - unusually weak or tired, fainting spells, lightheadedness breathing  problems changes in hearing changes in vision chest pain high blood pressure low blood counts - This drug may decrease the number of white blood cells, red blood cells and platelets. You may be at increased risk for infections and bleeding. nausea and vomiting pain, swelling, redness or irritation at the injection site pain, tingling, numbness in the hands or feet problems with balance, talking, walking trouble passing urine or change in the amount of urine Side effects that usually do not require medical attention (report to your doctor or health care professional if they continue or are bothersome): hair loss loss of appetite metallic taste in the mouth or changes in taste This list may not describe all possible side effects. Call your doctor for medical advice about side effects. You may report side effects to FDA at 1-800-FDA-1088. Where should I keep my medication? This drug is given in a hospital or clinic and will not be stored at home. NOTE: This sheet is a summary. It may not cover all possible information. If you have  questions about this medicine, talk to your doctor, pharmacist, or health care provider.  2022 Elsevier/Gold Standard (2007-08-11 00:00:00)

## 2021-03-22 ENCOUNTER — Inpatient Hospital Stay: Payer: Medicare Other | Admitting: Dietician

## 2021-03-22 ENCOUNTER — Telehealth: Payer: Self-pay | Admitting: *Deleted

## 2021-03-22 NOTE — Telephone Encounter (Signed)
-----   Message from Anastasia Pall, RN sent at 03/21/2021  1:37 PM EST ----- Regarding: 1st time gemcitabine follow-up, being seen by Dr. Alvy Bimler 1st time follow-up call, patient received gemcitabine with no issues. Being seen by Dr. Alvy Bimler.

## 2021-03-22 NOTE — Telephone Encounter (Signed)
Called pt to see how she did with her recent treatment.  She reports doing well other than some weakness/fatigue & a little constipation.  She is resting frequently & is using Miralax for constipation.  She reports knowing how to reach [name] if needed.

## 2021-03-23 NOTE — Progress Notes (Signed)
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   There were no signs of acute toxicity after treatment. She has had some loss of visual acuity and plans to follow up with her ophthalmologist and some brain fog. She just started chemotherapy as well and anticipates starting avastin soon too which Dr. Mickeal Skinner thinks may also improve her symptoms.   Impression/Plan: 1. 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 The patient has been doing well since completion of radiotherapy. We discussed that we would be happy to continue to follow her as needed, but she will also continue to follow up with Dr. Alvy Bimler and Dr. Mickeal Skinner.      Carola Rhine, PAC

## 2021-03-25 ENCOUNTER — Ambulatory Visit
Admission: RE | Admit: 2021-03-25 | Discharge: 2021-03-25 | Disposition: A | Discharge status: Home / Self Care | Payer: Medicare Other | Source: Ambulatory Visit | Attending: Radiation Oncology | Admitting: Radiation Oncology

## 2021-03-25 DIAGNOSIS — C7931 Secondary malignant neoplasm of brain: Secondary | ICD-10-CM | POA: Insufficient documentation

## 2021-03-25 DIAGNOSIS — C482 Malignant neoplasm of peritoneum, unspecified: Secondary | ICD-10-CM | POA: Insufficient documentation

## 2021-03-26 ENCOUNTER — Encounter: Payer: Self-pay | Admitting: Hematology and Oncology

## 2021-03-26 ENCOUNTER — Other Ambulatory Visit: Payer: Self-pay | Admitting: Radiation Therapy

## 2021-03-26 NOTE — Progress Notes (Signed)
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   There were no signs of acute toxicity after treatment.  Plan: The patient will receive a call in about one month from the radiation oncology department. She will continue follow up with Dr. Alvy Bimler as well.      Carola Rhine, PAC

## 2021-03-28 ENCOUNTER — Inpatient Hospital Stay: Payer: Medicare Other

## 2021-03-28 ENCOUNTER — Other Ambulatory Visit: Payer: Self-pay

## 2021-03-28 ENCOUNTER — Encounter: Payer: Self-pay | Admitting: Hematology and Oncology

## 2021-03-28 ENCOUNTER — Inpatient Hospital Stay (HOSPITAL_BASED_OUTPATIENT_CLINIC_OR_DEPARTMENT_OTHER): Payer: Medicare Other | Admitting: Hematology and Oncology

## 2021-03-28 ENCOUNTER — Inpatient Hospital Stay: Payer: Medicare Other | Admitting: Nutrition

## 2021-03-28 DIAGNOSIS — C482 Malignant neoplasm of peritoneum, unspecified: Secondary | ICD-10-CM | POA: Diagnosis not present

## 2021-03-28 DIAGNOSIS — Z5111 Encounter for antineoplastic chemotherapy: Secondary | ICD-10-CM | POA: Diagnosis not present

## 2021-03-28 DIAGNOSIS — D6481 Anemia due to antineoplastic chemotherapy: Secondary | ICD-10-CM | POA: Diagnosis not present

## 2021-03-28 DIAGNOSIS — C7931 Secondary malignant neoplasm of brain: Secondary | ICD-10-CM | POA: Diagnosis not present

## 2021-03-28 DIAGNOSIS — R911 Solitary pulmonary nodule: Secondary | ICD-10-CM | POA: Diagnosis not present

## 2021-03-28 DIAGNOSIS — R569 Unspecified convulsions: Secondary | ICD-10-CM | POA: Diagnosis not present

## 2021-03-28 DIAGNOSIS — T451X5A Adverse effect of antineoplastic and immunosuppressive drugs, initial encounter: Secondary | ICD-10-CM | POA: Diagnosis not present

## 2021-03-28 LAB — CMP (CANCER CENTER ONLY)
ALT: 26 U/L (ref 0–44)
AST: 20 U/L (ref 15–41)
Albumin: 4.1 g/dL (ref 3.5–5.0)
Alkaline Phosphatase: 50 U/L (ref 38–126)
Anion gap: 7 (ref 5–15)
BUN: 13 mg/dL (ref 8–23)
CO2: 30 mmol/L (ref 22–32)
Calcium: 9.3 mg/dL (ref 8.9–10.3)
Chloride: 102 mmol/L (ref 98–111)
Creatinine: 0.51 mg/dL (ref 0.44–1.00)
GFR, Estimated: >60 mL/min (ref >60)
Glucose, Bld: 84 mg/dL (ref 70–99)
Potassium: 3.4 mmol/L — ABNORMAL LOW (ref 3.5–5.1)
Sodium: 139 mmol/L (ref 135–145)
Total Bilirubin: 0.4 mg/dL (ref 0.3–1.2)
Total Protein: 6.7 g/dL (ref 6.5–8.1)

## 2021-03-28 LAB — CBC WITH DIFFERENTIAL (CANCER CENTER ONLY)
Abs Immature Granulocytes: 0.02 10*3/uL (ref 0.00–0.07)
Basophils Absolute: 0 10*3/uL (ref 0.0–0.1)
Basophils Relative: 1 %
Eosinophils Absolute: 0.2 10*3/uL (ref 0.0–0.5)
Eosinophils Relative: 3 %
HCT: 32.8 % — ABNORMAL LOW (ref 36.0–46.0)
Hemoglobin: 10.8 g/dL — ABNORMAL LOW (ref 12.0–15.0)
Immature Granulocytes: 0 %
Lymphocytes Relative: 43 %
Lymphs Abs: 2 10*3/uL (ref 0.7–4.0)
MCH: 29.5 pg (ref 26.0–34.0)
MCHC: 32.9 g/dL (ref 30.0–36.0)
MCV: 89.6 fL (ref 80.0–100.0)
Monocytes Absolute: 0.3 10*3/uL (ref 0.1–1.0)
Monocytes Relative: 6 %
Neutro Abs: 2.1 10*3/uL (ref 1.7–7.7)
Neutrophils Relative %: 47 %
Platelet Count: 190 10*3/uL (ref 150–400)
RBC: 3.66 MIL/uL — ABNORMAL LOW (ref 3.87–5.11)
RDW: 14.7 % (ref 11.5–15.5)
WBC Count: 4.7 10*3/uL (ref 4.0–10.5)
nRBC: 0 % (ref 0.0–0.2)

## 2021-03-28 MED ORDER — SODIUM CHLORIDE 0.9 % IV SOLN
800.0000 mg/m2 | Freq: Once | INTRAVENOUS | Status: AC
  Administered 2021-03-28: 1140 mg via INTRAVENOUS
  Filled 2021-03-28: qty 29.98

## 2021-03-28 MED ORDER — PROCHLORPERAZINE MALEATE 10 MG PO TABS
10.0000 mg | ORAL_TABLET | Freq: Once | ORAL | Status: AC
  Administered 2021-03-28: 10 mg via ORAL

## 2021-03-28 MED ORDER — SODIUM CHLORIDE 0.9% FLUSH
10.0000 mL | Freq: Once | INTRAVENOUS | Status: AC
  Administered 2021-03-28: 10 mL

## 2021-03-28 MED ORDER — SODIUM CHLORIDE 0.9 % IV SOLN: Freq: Once | INTRAVENOUS | Status: AC

## 2021-03-28 NOTE — Assessment & Plan Note (Signed)
Overall, she tolerated treatment well except for mild fatigue and anemia which is not unexpected We will proceed with treatment without delay I recommend minimum 3 to 4 cycles of treatment before repeating imaging study

## 2021-03-28 NOTE — Progress Notes (Addendum)
71 year old female diagnosed with Peritoneal cancer followed by Dr. Alvy Bimler.  Patient is receiving carboplatin and gemcitabine every 21 days.    Past medical history includes reflux, hiatal hernia, IBS, DCIS, and osteoporosis.  Medications include vitamin D, Decadron, Zofran, probiotics, Compazine, and vitamin E.  Labs include potassium of 3.4.  Height: 5 feet 5 inches. Weight: 99.6 pounds on January 12. Usual body weight: 110 pounds per patient many years ago. BMI: 16.57.  Patient denies nausea and vomiting.   She reports her appetite is okay.  Some days she eats more than others.   She has occasional constipation which then can turn into diarrhea.   She typically eats 2 cups of high-protein silk almond/cashew milk for breakfast at lunch she has hummus with nuts and seeds.  And at dinner she has fish and chicken with vegetables. She is not drinking any type of oral nutrition supplement. Her weight is stable. Patient expresses concerns regarding the news that she is not curable.  States she does not know what to do with that information.  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  Intervention: Encouraged frequent small meals and snacks utilizing foods patient enjoys.  Try to eat high-protein foods. Consider trying oral nutrition supplement. Discussed food safety. Referred patient to chaplain for support. Questions were answered.  Teach back method used.  Contact information provided.  Monitoring, evaluation, goals: Patient will tolerate adequate calories and protein to minimize weight loss and promote quality of life.  Next visit: No follow-up scheduled.  Patient has my contact information for questions or concerns.  **Disclaimer: This note was dictated with voice recognition software. Similar sounding words can inadvertently be transcribed and this note may contain transcription  errors which may not have been corrected upon publication of note.**

## 2021-03-28 NOTE — Assessment & Plan Note (Signed)

## 2021-03-28 NOTE — Progress Notes (Signed)
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 The patient denies recent history of bleeding such as epistaxis, hematuria or hematochezia. She is asymptomatic from the anemia. I will observe for now.  She does not require transfusion now. I will continue the chemotherapy at current dose without dosage adjustment.  If the anemia gets progressive worse in the future, I might have to delay her treatment or adjust the chemotherapy dose.   No orders of the defined types were placed in this encounter.   All questions were answered. The patient knows to call the clinic with any problems, questions or concerns. The total time spent in the appointment was 20 minutes encounter with patients including review of chart and various tests results, discussions about plan of care and coordination of care plan   [name], MD 03/28/2021 7:12 PM  INTERVAL HISTORY: Please see below for problem oriented charting. she returns for treatment follow-up seen prior to cycle 1 day 8 of treatment She is doing well except for mild fatigue Denies recent seizures or headache No recent nausea or changes in bowel habits  REVIEW OF SYSTEMS:   Constitutional: Denies fevers, chills or abnormal weight loss Eyes: Denies blurriness of vision Ears, nose, mouth, throat, and face: Denies mucositis or sore throat Respiratory: Denies cough, dyspnea or wheezes Cardiovascular: Denies palpitation,  chest discomfort or lower extremity swelling Gastrointestinal:  Denies nausea, heartburn or change in bowel habits Skin: Denies abnormal skin rashes Lymphatics: Denies new lymphadenopathy or easy bruising Neurological:Denies numbness, tingling or new weaknesses Behavioral/Psych: Mood is stable, no new changes  All other systems were reviewed with the patient and are negative.  I have reviewed the past medical history, past surgical history, social history and family history with the patient and they are unchanged from previous note.  ALLERGIES:  is allergic to fentanyl, vancomycin, lac bovis, brimonidine tartrate, ditropan [oxybutynin], milk-related compounds, other, oxybutynin chloride, thimerosal, tramadol, avelox [moxifloxacin hcl in nacl], doxycycline, erythromycin, metronidazole, minocycline, penicillins, quinolones, and sulfonamide derivatives.  MEDICATIONS:  Current Outpatient Medications  Medication Sig Dispense Refill   Azelaic Acid 15 % cream Apply 1 application topically daily.     cholecalciferol (VITAMIN D3) 25 MCG (1000 UNIT) tablet Take 2,000 Units by mouth daily.     dexamethasone (DECADRON) 1 MG tablet Take 1 tablet (1 mg total) by mouth daily with breakfast. 60 tablet 1   gabapentin (NEURONTIN) 300 MG capsule Take 300-600 mg by mouth See admin instructions. Taking 600 mg in am ;300 mg at lunch; 300 mg at hs.     levETIRAcetam (KEPPRA) 500 MG tablet TAKE 2 TABLETS BY MOUTH TWICE DAILY 120 tablet 1   Magnesium 250 MG TABS Take 250 mg by mouth daily.     ondansetron (ZOFRAN) 8 MG tablet Take 1 tablet (8 mg total) by mouth every 8 (eight) hours as needed. 30 tablet 1   Probiotic Product (PROBIOTIC DAILY PO) Take 1 capsule by mouth daily.     prochlorperazine (COMPAZINE) 10 MG tablet Take  1 tablet (10 mg total) by mouth every 6 (six) hours as needed (Nausea or vomiting). 30 tablet 1   risedronate (ACTONEL) 35 MG tablet Take 35 mg by mouth every 7 (seven) days. with water on empty  stomach, nothing by mouth or lie down for next 30 minutes. (Patient not taking: Reported on 01/31/2021)     Sulfacetamide Sodium, Acne, 10 % LOTN Apply 1 application topically at bedtime.     VITAMIN E PO Take 50 Int'l Units/day by mouth daily.     No current facility-administered medications for this visit.    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 Findings highly suspicious for peritoneal carcinomatosis. Recommend paracentesis for therapeutic and diagnostic purposes. I do not see an obvious primary lesion but there is some irregular enhancing soft tissue in the right adnexal area. CA 125 level may be helpful. 2. Mild surface irregularity involving the liver but I do not see any obvious changes of cirrhosis. No hepatic lesions.   11/23/2018 Tumor Marker   Patient's tumor was tested for the following markers: CA-125 Results of the tumor marker test revealed 253.   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   12/01/2018 Cancer Staging   Staging form: Ovary, Fallopian Tube, and Primary Peritoneal Carcinoma, AJCC 8th Edition - Clinical stage from 12/01/2018: Stage IVA (rcT3, cN0, pM1a) - Signed by [name], MD on 02/14/2021 Stage prefix: Recurrence    12/02/2018 Tumor Marker   Patient's tumor was tested for the following markers: CA-125 Results of the tumor marker test revealed 267   12/06/2018 - 05/16/2019 Chemotherapy   The patient had carboplatin and taxol for chemotherapy treatment.     12/08/2018 Procedure   Successful ultrasound-guided therapeutic paracentesis yielding 1.7 liters of peritoneal fluid.   12/24/2018 Procedure   Successful placement of a right IJ approach Power Port with  ultrasound and fluoroscopic guidance. The catheter is ready for use   01/17/2019 Tumor Marker   Patient's tumor was tested for the following markers: CA-125 Results of the tumor marker test revealed 57.2   01/31/2019 Imaging   Significant decrease in peritoneal carcinomatosis since previous study. Interval resolution of ascites.   Focal area of parenchymal consolidation in central right middle lobe, which measures 3 cm. Differential diagnosis includes infectious or inflammatory process, atelectasis, and neoplasm. Recommend short-term follow-up by chest CT in 2-3 months.     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   Surgeon: Donaciano Eva    Operative Findings:  : grossly normal uterus, ovaries normal, few scattered peritoneal nodules (38m) on serosa of uterus and tubes. The omentum (gastrocolic) was tethered to the mesentery with tumor (thin rind). Complete (optimal) resection of tumor with no gross residual disease.     02/17/2019 Pathology Results   FINAL MICROSCOPIC DIAGNOSIS: A. UTERUS, CERVIX, BILATERAL FALLOPIAN TUBES AND OVARIES, HYSTERECTOMY WITH SALPINGOOOPHORECTOMY: - Uterus: Endometrium: Inactive endometrium. No hyperplasia or malignancy. Myometrium: Unremarkable. No malignancy. Serosa: Metastatic carcinoma. No malignancy. - Cervix: Benign squamous and endocervical mucosa. No dysplasia or malignancy. - Left ovary  and fallopian tube: Metastatic carcinoma. - Right ovary: No malignancy identified. - Right fallopian tube: Luminal tumor, see comment. B. OMENTUM, RESECTION: - High grade serous carcinoma. - Deposits up to at least 3 cm. - See oncology table. ONCOLOGY TABLE: OVARY or FALLOPIAN TUBE or PRIMARY  PERITONEUM: Procedure: Hysterectomy with bilateral salpingo-oophorectomy and omentectomy. Specimen Integrity: Intact Tumor Site: Peritoneum Ovarian Surface Involvement (required only if applicable): Left ovary Fallopian Tube Surface Involvement (required only if applicable): Left Fallopian tube Tumor Size: Largest deposit 3 cm Histologic Type: High-grade serous carcinoma Histologic Grade: High-grade Implants (required for advanced stage serous/seromucinous borderline tumors only): Uterine serosa, left fallopian tube and ovary, omentum Other Tissue/ Organ Involvement: As above Largest Extrapelvic Peritoneal Focus (required only if applicable): 3 cm Peritoneal/Ascitic Fluid: Pre neoadjuvant [id number] positive for carcinoma. Treatment Effect (required only for high-grade serous carcinomas): Probably partial treatment effect in omental tissue. Regional Lymph Nodes: No lymph nodes submitted or found Pathologic Stage Classification (pTNM, AJCC 8th Edition): ypT3c, ypNX Representative Tumor Block: B5 Comment(s): There is surface involvement of the left ovary and fallopian tube but no primary tumor. Within the right fallopian tube lumen there is detached fragments of tumor but again no precursor lesion is noted in the right fallopian tube. Thus, the tumor is presumed primary peritoneal and staged as such.   03/21/2019 Tumor Marker   Patient's tumor was tested for the following markers: CA-125 Results of the tumor marker test revealed 15.1    Genetic Testing   Negative genetic testing. No pathogenic variants identified on the [facility] CancerNext + RNAinsight panel. VUS in ATM called [id number] identified. The report date is 05/12/2019. TumorNext HRD was originally ordered but there was not enough sample to complete this testing.   The CancerNext+RNAinsight gene panel offered by [facility] includes sequencing and rearrangement analysis for the following 36 genes: APC*, ATM*, AXIN2, BARD1, BMPR1A,  BRCA1*, BRCA2*, BRIP1*, CDH1*, CDK4, CDKN2A, CHEK2*, DICER1, MLH1*, MSH2*, MSH3, MSH6*, MUTYH*, NBN, NF1*, NTHL1, PALB2*, PMS2*, PTEN*, RAD51C*, RAD51D*, RECQL, SMAD4, SMARCA4, STK11 and TP53* (sequencing and deletion/duplication); HOXB13, POLD1 and POLE (sequencing only); EPCAM and GREM1 (deletion/duplication only). DNA and RNA analyses performed for * genes.    05/16/2019 Tumor Marker   Patient's tumor was tested for the following markers: CA-125 Results of the tumor marker test revealed 10.7   06/16/2019 Imaging   1. No evidence of residual or recurrent metastatic disease in the chest, abdomen or pelvis. 2. Masslike focus of consolidation in the right middle lobe along the major and minor fissures with associated volume loss is stable since 01/31/2019. Indolent primary pulmonary neoplasm not excluded. PET-CT may be considered for further characterization. 3. Aortic Atherosclerosis (ICD10-I70.0).   06/16/2019 Tumor Marker   Patient's tumor was tested for the following markers: CA-125 Results of the tumor marker test revealed 9.1.   12/05/2019 Tumor Marker   Patient's tumor was tested for the following markers: CA-125. Results of the tumor marker test revealed 9.0.   02/29/2020 Tumor Marker   Patient's tumor was tested for the following markers: CA-`125 Results of the tumor marker test revealed 9.7.   05/28/2020 Tumor Marker   Patient's tumor was tested for the following markers: CA-125 Results of the tumor marker test revealed 9.5   08/16/2020 Tumor Marker   Patient's tumor was tested for the following markers: CA-125 Results of the tumor marker test revealed 10.9   11/29/2020 Tumor Marker   Patient's tumor was tested for the following markers: CA-125. Results of the tumor marker test  revealed 10.2.   01/22/2021 Pathology Results   SURGICAL PATHOLOGY  CASE: [phone]  PATIENT: [name] [name]  Surgical Pathology Report   Clinical History: brain metastasis (cm)    FINAL  MICROSCOPIC DIAGNOSIS:   A. 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    03/20/2021 Imaging   1. Unchanged enlarged midline superior mediastinal and left retroperitoneal lymph nodes, which remain suspicious for metastatic disease. 2. Unchanged masslike consolidation and volume loss of the right middle lobe measuring 3.8 x 1.9 cm, likely chronic scarring. Continued attention on follow-up. 3. No new evidence of metastatic disease in the chest, abdomen, or pelvis. 4. Status post hysterectomy and omentectomy.   Aortic Atherosclerosis (ICD10-I70.0).     03/21/2021 -  Chemotherapy   Patient is on Treatment Plan : OVARIAN RECURRENT 3RD LINE Carboplatin D1 / Gemcitabine D1,8 (4/800) q21d       PHYSICAL EXAMINATION: ECOG PERFORMANCE STATUS: 1 - Symptomatic but completely ambulatory  Vitals:   03/28/21 0946  BP: (!) 112/45  Pulse: 73  Resp: 18  Temp: 98.4 F (36.9 C)  SpO2: 99%   Filed Weights   03/28/21 0946  Weight: 99 lb 9.6 oz (45.2 kg)    GENERAL:alert, no distress and comfortable SKIN: skin color, texture, turgor are normal, no rashes or significant lesions EYES: normal, Conjunctiva are pink and non-injected, sclera clear OROPHARYNX:no exudate, no erythema and lips, buccal mucosa, and tongue normal  NECK: supple, thyroid normal size, non-tender, without nodularity LYMPH:  no palpable lymphadenopathy in the cervical, axillary or inguinal LUNGS: clear to auscultation and percussion with normal breathing effort HEART: regular rate & rhythm and no murmurs and no lower extremity edema ABDOMEN:abdomen soft, non-tender and normal bowel sounds Musculoskeletal:no cyanosis of digits and no clubbing  NEURO: alert & oriented x 3 with fluent speech, no focal motor/sensory deficits  LABORATORY DATA:  I have reviewed the data as listed    Component Value  Date/Time   NA 139 03/28/2021 0923   K 3.4 (L) 03/28/2021 0923   CL 102 03/28/2021 0923   CO2 30 03/28/2021 0923   GLUCOSE 84 03/28/2021 0923   BUN 13 03/28/2021 0923   CREATININE 0.51 03/28/2021 0923   CALCIUM 9.3 03/28/2021 0923   PROT 6.7 03/28/2021 0923   ALBUMIN 4.1 03/28/2021 0923   AST 20 03/28/2021 0923   ALT 26 03/28/2021 0923   ALKPHOS 50 03/28/2021 0923   BILITOT 0.4 03/28/2021 0923   GFRNONAA >60 03/28/2021 0923   GFRAA >60 12/05/2019 1145    No results found for: SPEP, UPEP  Lab Results  Component Value Date   WBC 4.7 03/28/2021   NEUTROABS 2.1 03/28/2021   HGB 10.8 (L) 03/28/2021   HCT 32.8 (L) 03/28/2021   MCV 89.6 03/28/2021   PLT 190 03/28/2021      Chemistry      Component Value Date/Time   NA 139 03/28/2021 0923   K 3.4 (L) 03/28/2021 0923   CL 102 03/28/2021 0923   CO2 30 03/28/2021 0923   BUN 13 03/28/2021 0923   CREATININE 0.51 03/28/2021 0923      Component Value Date/Time   CALCIUM 9.3 03/28/2021 0923   ALKPHOS 50 03/28/2021 0923   AST 20 03/28/2021 0923   ALT 26 03/28/2021 0923   BILITOT 0.4 03/28/2021 0923       RADIOGRAPHIC STUDIES: I have personally reviewed  the radiological images as listed and agreed with the findings in the report. CT CHEST ABDOMEN PELVIS W CONTRAST  Result Date: 03/20/2021 CLINICAL DATA:  Metastatic primary peritoneal carcinoma, status post hysterectomy and omentectomy EXAM: CT CHEST, ABDOMEN, AND PELVIS WITH CONTRAST TECHNIQUE: Multidetector CT imaging of the chest, abdomen and pelvis was performed following the standard protocol during bolus administration of intravenous contrast. CONTRAST:  53m OMNIPAQUE IOHEXOL 350 MG/ML SOLN, additional oral enteric contrast COMPARISON:  12/22/2020 FINDINGS: CT CHEST FINDINGS Cardiovascular: Right chest port catheter. Normal heart size. No pericardial effusion. Mediastinum/Nodes: Unchanged enlarged midline superior mediastinal lymph node measuring 1.1 x 1.0 cm (series 2,  image 17). No other enlarged mediastinal, hilar, or axillary lymph nodes. Thyroid gland, trachea, and esophagus demonstrate no significant findings. Lungs/Pleura: Unchanged masslike consolidation and volume loss of the right middle lobe measuring 3.8 x 1.9 cm (series 4, image 84). No pleural effusion or pneumothorax. Musculoskeletal: No chest wall mass or suspicious bone lesions identified. CT ABDOMEN PELVIS FINDINGS Hepatobiliary: No solid liver abnormality is seen. No gallstones, gallbladder wall thickening, or biliary dilatation. Pancreas: Unremarkable. No pancreatic ductal dilatation or surrounding inflammatory changes. Spleen: Normal in size without significant abnormality. Adrenals/Urinary Tract: Adrenal glands are unremarkable. Kidneys are normal, without renal calculi, solid lesion, or hydronephrosis. Bladder is unremarkable. Stomach/Bowel: Stomach is within normal limits. Status post omentectomy. Appendix appears normal. No evidence of bowel wall thickening, distention, or inflammatory changes. Vascular/Lymphatic: Aortic atherosclerosis. Unchanged enlarged left retroperitoneal lymph node measuring 2.2 x 1.5 cm (series 2, image 66). Reproductive: Status post hysterectomy. Other: No abdominal wall hernia or abnormality. No abdominopelvic ascites. Musculoskeletal: No acute or significant osseous findings. IMPRESSION: 1. Unchanged enlarged midline superior mediastinal and left retroperitoneal lymph nodes, which remain suspicious for metastatic disease. 2. Unchanged masslike consolidation and volume loss of the right middle lobe measuring 3.8 x 1.9 cm, likely chronic scarring. Continued attention on follow-up. 3. No new evidence of metastatic disease in the chest, abdomen, or pelvis. 4. Status post hysterectomy and omentectomy. Aortic Atherosclerosis (ICD10-I70.0). Electronically Signed   By: ADelanna AhmadiM.D.   On: 03/20/2021 08:16

## 2021-03-28 NOTE — Patient Instructions (Signed)
[facility]COL[facility]  Discharge Instructions: Thank you for choosing [facility] to provide your oncology and hematology care.   If you have a lab appointment with the [facility], please go directly to the [facility] and check in at the registration area.   Wear comfortable clothing and clothing appropriate for easy access to any Portacath or PICC line.   We strive to give you quality time with your provider. You may need to reschedule your appointment if you arrive late (15 or more minutes).  Arriving late affects you and other patients whose appointments are after yours.  Also, if you miss three or more appointments without notifying the office, you may be dismissed from the clinic at the providers discretion.      For prescription refill requests, have your pharmacy contact our office and allow 72 hours for refills to be completed.    Today you received the following chemotherapy and/or immunotherapy agent: Gemcitabine    To help prevent nausea and vomiting after your treatment, we encourage you to take your nausea medication as directed.  BELOW ARE SYMPTOMS THAT SHOULD BE REPORTED IMMEDIATELY: *FEVER GREATER THAN 100.4 F (38 C) OR HIGHER *CHILLS OR SWEATING *NAUSEA AND VOMITING THAT IS NOT CONTROLLED WITH YOUR NAUSEA MEDICATION *UNUSUAL SHORTNESS OF BREATH *UNUSUAL BRUISING OR BLEEDING *URINARY PROBLEMS (pain or burning when urinating, or frequent urination) *BOWEL PROBLEMS (unusual diarrhea, constipation, pain near the anus) TENDERNESS IN MOUTH AND THROAT WITH OR WITHOUT PRESENCE OF ULCERS (sore throat, sores in mouth, or a toothache) UNUSUAL RASH, SWELLING OR PAIN  UNUSUAL VAGINAL DISCHARGE OR ITCHING   Items with * indicate a potential emergency and should be followed up as soon as possible or go to the Emergency Department if any problems should occur.  Please show the CHEMOTHERAPY ALERT CARD or IMMUNOTHERAPY ALERT CARD at check-in to  the Emergency Department and triage nurse.  Should you have questions after your visit or need to cancel or reschedule your appointment, please contact [facility]  Dept: [phone]  and follow the prompts.  Office hours are 8:00 a.m. to 4:30 p.m. Monday - Friday. Please note that voicemails left after 4:00 p.m. may not be returned until the following business day.  We are closed weekends and major holidays. You have access to a nurse at all times for urgent questions. Please call the main number to the clinic Dept: [phone] and follow the prompts.   For any non-urgent questions, you may also contact your provider using MyChart. We now offer e-Visits for anyone 44 and older to request care online for non-urgent symptoms. For details visit [email or web address].   Also download the MyChart app! Go to the app store, search "MyChart", open the app, select [facility], and log in with your MyChart username and password.  Due to Covid, a mask is required upon entering the hospital/clinic. If you do not have a mask, one will be given to you upon arrival. For doctor visits, patients may have 1 support person aged 65 or older with them. For treatment visits, patients cannot have anyone with them due to current Covid guidelines and our immunocompromised population.   Gemcitabine injection What is this medication? GEMCITABINE (jem SYE ta been) is a chemotherapy drug. This medicine is used to treat many types of cancer like breast cancer, lung cancer, pancreatic cancer, and ovarian cancer. This medicine may be used for other purposes; ask your health care provider or pharmacist if  you have questions. COMMON BRAND NAME(S): Gemzar, Infugem What should I tell my care team before I take this medication? They need to know if you have any of these conditions: blood disorders infection kidney disease liver disease lung or breathing disease, like asthma recent or ongoing  radiation therapy an unusual or allergic reaction to gemcitabine, other chemotherapy, other medicines, foods, dyes, or preservatives pregnant or trying to get pregnant breast-feeding How should I use this medication? This drug is given as an infusion into a vein. It is administered in a hospital or clinic by a specially trained health care professional. Talk to your pediatrician regarding the use of this medicine in children. Special care may be needed. Overdosage: If you think you have taken too much of this medicine contact a poison control center or emergency room at once. NOTE: This medicine is only for you. Do not share this medicine with others. What if I miss a dose? It is important not to miss your dose. Call your doctor or health care professional if you are unable to keep an appointment. What may interact with this medication? medicines to increase blood counts like filgrastim, pegfilgrastim, sargramostim some other chemotherapy drugs like cisplatin vaccines Talk to your doctor or health care professional before taking any of these medicines: acetaminophen aspirin ibuprofen ketoprofen naproxen This list may not describe all possible interactions. Give your health care provider a list of all the medicines, herbs, non-prescription drugs, or dietary supplements you use. Also tell them if you smoke, drink alcohol, or use illegal drugs. Some items may interact with your medicine. What should I watch for while using this medication? Visit your doctor for checks on your progress. This drug may make you feel generally unwell. This is not uncommon, as chemotherapy can affect healthy cells as well as cancer cells. Report any side effects. Continue your course of treatment even though you feel ill unless your doctor tells you to stop. In some cases, you may be given additional medicines to help with side effects. Follow all directions for their use. Call your doctor or health care  professional for advice if you get a fever, chills or sore throat, or other symptoms of a cold or flu. Do not treat yourself. This drug decreases your body's ability to fight infections. Try to avoid being around people who are sick. This medicine may increase your risk to bruise or bleed. Call your doctor or health care professional if you notice any unusual bleeding. Be careful brushing and flossing your teeth or using a toothpick because you may get an infection or bleed more easily. If you have any dental work done, tell your dentist you are receiving this medicine. Avoid taking products that contain aspirin, acetaminophen, ibuprofen, naproxen, or ketoprofen unless instructed by your doctor. These medicines may hide a fever. Do not become pregnant while taking this medicine or for 6 months after stopping it. Women should inform their doctor if they wish to become pregnant or think they might be pregnant. Men should not father a child while taking this medicine and for 3 months after stopping it. There is a potential for serious side effects to an unborn child. Talk to your health care professional or pharmacist for more information. Do not breast-feed an infant while taking this medicine or for at least 1 week after stopping it. Men should inform their doctors if they wish to father a child. This medicine may lower sperm counts. Talk with your doctor or health care professional if  you are concerned about your fertility. What side effects may I notice from receiving this medication? Side effects that you should report to your doctor or health care professional as soon as possible: allergic reactions like skin rash, itching or hives, swelling of the face, lips, or tongue breathing problems pain, redness, or irritation at site where injected signs and symptoms of a dangerous change in heartbeat or heart rhythm like chest pain; dizziness; fast or irregular heartbeat; palpitations; feeling faint or  lightheaded, falls; breathing problems signs of decreased platelets or bleeding - bruising, pinpoint red spots on the skin, black, tarry stools, blood in the urine signs of decreased red blood cells - unusually weak or tired, feeling faint or lightheaded, falls signs of infection - fever or chills, cough, sore throat, pain or difficulty passing urine signs and symptoms of kidney injury like trouble passing urine or change in the amount of urine signs and symptoms of liver injury like dark yellow or brown urine; general ill feeling or flu-like symptoms; light-colored stools; loss of appetite; nausea; right upper belly pain; unusually weak or tired; yellowing of the eyes or skin swelling of ankles, feet, hands Side effects that usually do not require medical attention (report to your doctor or health care professional if they continue or are bothersome): constipation diarrhea hair loss loss of appetite nausea rash vomiting This list may not describe all possible side effects. Call your doctor for medical advice about side effects. You may report side effects to FDA at 1-800-FDA-1088. Where should I keep my medication? This drug is given in a hospital or clinic and will not be stored at home. NOTE: This sheet is a summary. It may not cover all possible information. If you have questions about this medicine, talk to your doctor, pharmacist, or health care provider.  2022 Elsevier/Gold Standard (2017-05-27 00:00:00)

## 2021-04-02 DIAGNOSIS — H2513 Age-related nuclear cataract, bilateral: Secondary | ICD-10-CM | POA: Diagnosis not present

## 2021-04-02 DIAGNOSIS — H40033 Anatomical narrow angle, bilateral: Secondary | ICD-10-CM | POA: Diagnosis not present

## 2021-04-02 DIAGNOSIS — H401421 Capsular glaucoma with pseudoexfoliation of lens, left eye, mild stage: Secondary | ICD-10-CM | POA: Diagnosis not present

## 2021-04-02 DIAGNOSIS — H35372 Puckering of macula, left eye: Secondary | ICD-10-CM | POA: Diagnosis not present

## 2021-04-02 DIAGNOSIS — H43813 Vitreous degeneration, bilateral: Secondary | ICD-10-CM | POA: Diagnosis not present

## 2021-04-08 MED FILL — Dexamethasone Sodium Phosphate Inj 100 MG/10ML: INTRAMUSCULAR | Qty: 1 | Status: AC

## 2021-04-08 MED FILL — Fosaprepitant Dimeglumine For IV Infusion 150 MG (Base Eq): INTRAVENOUS | Qty: 5 | Status: AC

## 2021-04-09 ENCOUNTER — Encounter: Payer: Self-pay | Admitting: Hematology and Oncology

## 2021-04-09 ENCOUNTER — Inpatient Hospital Stay: Payer: Medicare Other

## 2021-04-09 ENCOUNTER — Encounter: Payer: Self-pay | Admitting: General Practice

## 2021-04-09 ENCOUNTER — Inpatient Hospital Stay (HOSPITAL_BASED_OUTPATIENT_CLINIC_OR_DEPARTMENT_OTHER): Payer: Medicare Other | Admitting: Hematology and Oncology

## 2021-04-09 ENCOUNTER — Other Ambulatory Visit: Payer: Self-pay

## 2021-04-09 DIAGNOSIS — C482 Malignant neoplasm of peritoneum, unspecified: Secondary | ICD-10-CM

## 2021-04-09 DIAGNOSIS — C7931 Secondary malignant neoplasm of brain: Secondary | ICD-10-CM | POA: Diagnosis not present

## 2021-04-09 DIAGNOSIS — D6481 Anemia due to antineoplastic chemotherapy: Secondary | ICD-10-CM

## 2021-04-09 DIAGNOSIS — Z5111 Encounter for antineoplastic chemotherapy: Secondary | ICD-10-CM | POA: Diagnosis not present

## 2021-04-09 DIAGNOSIS — T451X5A Adverse effect of antineoplastic and immunosuppressive drugs, initial encounter: Secondary | ICD-10-CM | POA: Diagnosis not present

## 2021-04-09 DIAGNOSIS — R569 Unspecified convulsions: Secondary | ICD-10-CM | POA: Diagnosis not present

## 2021-04-09 DIAGNOSIS — R911 Solitary pulmonary nodule: Secondary | ICD-10-CM | POA: Diagnosis not present

## 2021-04-09 LAB — CMP (CANCER CENTER ONLY)
ALT: 19 U/L (ref 0–44)
AST: 19 U/L (ref 15–41)
Albumin: 4.2 g/dL (ref 3.5–5.0)
Alkaline Phosphatase: 47 U/L (ref 38–126)
Anion gap: 7 (ref 5–15)
BUN: 15 mg/dL (ref 8–23)
CO2: 29 mmol/L (ref 22–32)
Calcium: 9.2 mg/dL (ref 8.9–10.3)
Chloride: 104 mmol/L (ref 98–111)
Creatinine: 0.51 mg/dL (ref 0.44–1.00)
GFR, Estimated: >60 mL/min (ref >60)
Glucose, Bld: 86 mg/dL (ref 70–99)
Potassium: 3.4 mmol/L — ABNORMAL LOW (ref 3.5–5.1)
Sodium: 140 mmol/L (ref 135–145)
Total Bilirubin: 0.3 mg/dL (ref 0.3–1.2)
Total Protein: 6.7 g/dL (ref 6.5–8.1)

## 2021-04-09 LAB — CBC WITH DIFFERENTIAL (CANCER CENTER ONLY)
Abs Immature Granulocytes: 0.02 10*3/uL (ref 0.00–0.07)
Basophils Absolute: 0 10*3/uL (ref 0.0–0.1)
Basophils Relative: 0 %
Eosinophils Absolute: 0.1 10*3/uL (ref 0.0–0.5)
Eosinophils Relative: 1 %
HCT: 32.1 % — ABNORMAL LOW (ref 36.0–46.0)
Hemoglobin: 10.5 g/dL — ABNORMAL LOW (ref 12.0–15.0)
Immature Granulocytes: 0 %
Lymphocytes Relative: 44 %
Lymphs Abs: 2.2 10*3/uL (ref 0.7–4.0)
MCH: 29.8 pg (ref 26.0–34.0)
MCHC: 32.7 g/dL (ref 30.0–36.0)
MCV: 91.2 fL (ref 80.0–100.0)
Monocytes Absolute: 0.7 10*3/uL (ref 0.1–1.0)
Monocytes Relative: 15 %
Neutro Abs: 2 10*3/uL (ref 1.7–7.7)
Neutrophils Relative %: 40 %
Platelet Count: 286 10*3/uL (ref 150–400)
RBC: 3.52 MIL/uL — ABNORMAL LOW (ref 3.87–5.11)
RDW: 16.3 % — ABNORMAL HIGH (ref 11.5–15.5)
WBC Count: 4.9 10*3/uL (ref 4.0–10.5)
nRBC: 0 % (ref 0.0–0.2)

## 2021-04-09 MED ORDER — SODIUM CHLORIDE 0.9% FLUSH
10.0000 mL | INTRAVENOUS | Status: DC | PRN
  Administered 2021-04-09: 11:00:00 10 mL

## 2021-04-09 MED ORDER — SODIUM CHLORIDE 0.9 % IV SOLN
242.5730 mg | Freq: Once | INTRAVENOUS | Status: AC
  Administered 2021-04-09: 11:00:00 240 mg via INTRAVENOUS
  Filled 2021-04-09: qty 24

## 2021-04-09 MED ORDER — SODIUM CHLORIDE 0.9 % IV SOLN
10.0000 mg | Freq: Once | INTRAVENOUS | Status: AC
  Administered 2021-04-09: 09:00:00 10 mg via INTRAVENOUS
  Filled 2021-04-09: qty 10

## 2021-04-09 MED ORDER — PALONOSETRON HCL INJECTION 0.25 MG/5ML
0.2500 mg | Freq: Once | INTRAVENOUS | Status: AC
  Administered 2021-04-09: 09:00:00 0.25 mg via INTRAVENOUS
  Filled 2021-04-09: qty 5

## 2021-04-09 MED ORDER — SODIUM CHLORIDE 0.9% FLUSH
10.0000 mL | Freq: Once | INTRAVENOUS | Status: AC
  Administered 2021-04-09: 08:00:00 10 mL

## 2021-04-09 MED ORDER — HEPARIN SOD (PORK) LOCK FLUSH 100 UNIT/ML IV SOLN
500.0000 [IU] | Freq: Once | INTRAVENOUS | Status: AC | PRN
  Administered 2021-04-09: 11:00:00 500 [IU]

## 2021-04-09 MED ORDER — SODIUM CHLORIDE 0.9 % IV SOLN
800.0000 mg/m2 | Freq: Once | INTRAVENOUS | Status: AC
  Administered 2021-04-09: 10:00:00 1178 mg via INTRAVENOUS
  Filled 2021-04-09: qty 30.98

## 2021-04-09 MED ORDER — SODIUM CHLORIDE 0.9 % IV SOLN
150.0000 mg | Freq: Once | INTRAVENOUS | Status: AC
  Administered 2021-04-09: 09:00:00 150 mg via INTRAVENOUS
  Filled 2021-04-09: qty 150

## 2021-04-09 MED ORDER — SODIUM CHLORIDE 0.9 % IV SOLN: Freq: Once | INTRAVENOUS | Status: AC

## 2021-04-09 NOTE — Progress Notes (Signed)
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 The patient denies recent history of bleeding such as epistaxis, hematuria or hematochezia. She is asymptomatic from the anemia. I will observe for now.    No orders of the defined types were placed in this encounter.   All questions were answered. The patient knows to call the clinic with any problems, questions or concerns. The total time spent in the appointment was 20 minutes encounter with patients including review of chart and various tests results, discussions about plan of care and coordination of care plan   [name], MD 04/09/2021 10:53 AM  INTERVAL HISTORY: Please see below for problem oriented charting. she returns for treatment follow-up on combination treatment of carboplatin plus gemcitabine She had some questions related to future treatment So far, she tolerated treatment well No new neurological deficits  REVIEW OF SYSTEMS:   Constitutional: Denies fevers, chills or abnormal weight loss Eyes: Denies blurriness of vision Ears, nose, mouth, throat, and face: Denies mucositis or sore throat Respiratory: Denies cough, dyspnea or wheezes Cardiovascular: Denies palpitation, chest discomfort or lower extremity swelling Gastrointestinal:  Denies nausea, heartburn or change in bowel habits Skin: Denies abnormal skin rashes Lymphatics: Denies new lymphadenopathy or easy  bruising Neurological:Denies numbness, tingling or new weaknesses Behavioral/Psych: Mood is stable, no new changes  All other systems were reviewed with the patient and are negative.  I have reviewed the past medical history, past surgical history, social history and family history with the patient and they are unchanged from previous note.  ALLERGIES:  is allergic to fentanyl, vancomycin, lac bovis, brimonidine tartrate, ditropan [oxybutynin], milk-related compounds, other, oxybutynin chloride, thimerosal, tramadol, avelox [moxifloxacin hcl in nacl], doxycycline, erythromycin, metronidazole, minocycline, penicillins, quinolones, and sulfonamide derivatives.  MEDICATIONS:  Current Outpatient Medications  Medication Sig Dispense Refill   Azelaic Acid 15 % cream Apply 1 application topically daily.     dexamethasone (DECADRON) 1 MG tablet Take 1 tablet (1 mg total) by mouth daily with breakfast. 60 tablet 1   gabapentin (NEURONTIN) 300 MG capsule Take 300-600 mg by mouth See admin instructions. Taking 600 mg in am ;300 mg at lunch; 300 mg at hs.     levETIRAcetam (KEPPRA) 500 MG tablet TAKE 2 TABLETS BY MOUTH TWICE DAILY 120 tablet 1   Magnesium 250 MG TABS Take 250 mg by mouth daily.     ondansetron (ZOFRAN) 8 MG tablet Take 1 tablet (8 mg total) by mouth every 8 (eight) hours as needed. 30 tablet 1   Probiotic Product (PROBIOTIC DAILY PO) Take 1 capsule by mouth daily.     prochlorperazine (COMPAZINE) 10 MG tablet Take 1 tablet (10 mg total) by mouth every 6 (six) hours as needed (Nausea or vomiting). 30 tablet 1   Sulfacetamide Sodium, Acne, 10 % LOTN Apply 1 application topically at bedtime.     VITAMIN E PO Take 50 Int'l Units/day by mouth daily.     No current facility-administered medications for this visit.   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 Findings highly suspicious for peritoneal carcinomatosis. Recommend paracentesis for therapeutic and diagnostic purposes. I do not see an obvious primary lesion but there is some irregular enhancing soft tissue in the right adnexal area. CA 125 level may be helpful. 2. Mild surface irregularity involving the liver but I do not see any obvious changes of cirrhosis. No hepatic lesions.   11/23/2018 Tumor Marker   Patient's tumor was tested for the following markers: CA-125 Results of the tumor marker test revealed 253.   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   12/01/2018 Cancer Staging   Staging form: Ovary, Fallopian Tube, and Primary Peritoneal Carcinoma, AJCC 8th Edition - Clinical stage from 12/01/2018: Stage IVA (rcT3, cN0, pM1a) - Signed by [name], MD on 02/14/2021 Stage prefix: Recurrence    12/02/2018 Tumor Marker   Patient's tumor was tested for the following markers: CA-125 Results of the tumor marker test revealed 267   12/06/2018 - 05/16/2019 Chemotherapy   The patient had carboplatin and taxol for chemotherapy treatment.     12/08/2018 Procedure   Successful ultrasound-guided therapeutic paracentesis yielding 1.7 liters of peritoneal fluid.   12/24/2018 Procedure   Successful placement of a right IJ approach Power Port with ultrasound and  fluoroscopic guidance. The catheter is ready for use   01/17/2019 Tumor Marker   Patient's tumor was tested for the following markers: CA-125 Results of the tumor marker test revealed 57.2   01/31/2019 Imaging   Significant decrease in peritoneal carcinomatosis since previous study. Interval resolution of ascites.   Focal area of parenchymal consolidation in central right middle lobe, which measures 3 cm. Differential diagnosis includes infectious or inflammatory process, atelectasis, and neoplasm. Recommend short-term follow-up by chest CT in 2-3 months.     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   Surgeon: Donaciano Eva    Operative Findings:  : grossly normal uterus, ovaries normal, few scattered peritoneal nodules (37m) on serosa of uterus and tubes. The omentum (gastrocolic) was tethered to the mesentery with tumor (thin rind). Complete (optimal) resection of tumor with no gross residual disease.     02/17/2019 Pathology Results   FINAL MICROSCOPIC DIAGNOSIS: A. UTERUS, CERVIX, BILATERAL FALLOPIAN TUBES AND OVARIES, HYSTERECTOMY WITH SALPINGOOOPHORECTOMY: - Uterus: Endometrium: Inactive endometrium. No hyperplasia or malignancy. Myometrium: Unremarkable. No malignancy. Serosa: Metastatic carcinoma. No malignancy. - Cervix: Benign squamous and endocervical mucosa. No dysplasia or malignancy. - Left ovary and fallopian tube: Metastatic carcinoma. - Right  ovary: No malignancy identified. - Right fallopian tube: Luminal tumor, see comment. B. OMENTUM, RESECTION: - High grade serous carcinoma. - Deposits up to at least 3 cm. - See oncology table. ONCOLOGY TABLE: OVARY or FALLOPIAN TUBE or PRIMARY PERITONEUM: Procedure:  Hysterectomy with bilateral salpingo-oophorectomy and omentectomy. Specimen Integrity: Intact Tumor Site: Peritoneum Ovarian Surface Involvement (required only if applicable): Left ovary Fallopian Tube Surface Involvement (required only if applicable): Left Fallopian tube Tumor Size: Largest deposit 3 cm Histologic Type: High-grade serous carcinoma Histologic Grade: High-grade Implants (required for advanced stage serous/seromucinous borderline tumors only): Uterine serosa, left fallopian tube and ovary, omentum Other Tissue/ Organ Involvement: As above Largest Extrapelvic Peritoneal Focus (required only if applicable): 3 cm Peritoneal/Ascitic Fluid: Pre neoadjuvant [id number] positive for carcinoma. Treatment Effect (required only for high-grade serous carcinomas): Probably partial treatment effect in omental tissue. Regional Lymph Nodes: No lymph nodes submitted or found Pathologic Stage Classification (pTNM, AJCC 8th Edition): ypT3c, ypNX Representative Tumor Block: B5 Comment(s): There is surface involvement of the left ovary and fallopian tube but no primary tumor. Within the right fallopian tube lumen there is detached fragments of tumor but again no precursor lesion is noted in the right fallopian tube. Thus, the tumor is presumed primary peritoneal and staged as such.   03/21/2019 Tumor Marker   Patient's tumor was tested for the following markers: CA-125 Results of the tumor marker test revealed 15.1    Genetic Testing   Negative genetic testing. No pathogenic variants identified on the [facility] CancerNext + RNAinsight panel. VUS in ATM called [id number] identified. The report date is 05/12/2019. TumorNext HRD was originally ordered but there was not enough sample to complete this testing.   The CancerNext+RNAinsight gene panel offered by [facility] includes sequencing and rearrangement analysis for the following 36 genes: APC*, ATM*, AXIN2, BARD1, BMPR1A, BRCA1*, BRCA2*, BRIP1*, CDH1*,  CDK4, CDKN2A, CHEK2*, DICER1, MLH1*, MSH2*, MSH3, MSH6*, MUTYH*, NBN, NF1*, NTHL1, PALB2*, PMS2*, PTEN*, RAD51C*, RAD51D*, RECQL, SMAD4, SMARCA4, STK11 and TP53* (sequencing and deletion/duplication); HOXB13, POLD1 and POLE (sequencing only); EPCAM and GREM1 (deletion/duplication only). DNA and RNA analyses performed for * genes.    05/16/2019 Tumor Marker   Patient's tumor was tested for the following markers: CA-125 Results of the tumor marker test revealed 10.7   06/16/2019 Imaging   1. No evidence of residual or recurrent metastatic disease in the chest, abdomen or pelvis. 2. Masslike focus of consolidation in the right middle lobe along the major and minor fissures with associated volume loss is stable since 01/31/2019. Indolent primary pulmonary neoplasm not excluded. PET-CT may be considered for further characterization. 3. Aortic Atherosclerosis (ICD10-I70.0).   06/16/2019 Tumor Marker   Patient's tumor was tested for the following markers: CA-125 Results of the tumor marker test revealed 9.1.   12/05/2019 Tumor Marker   Patient's tumor was tested for the following markers: CA-125. Results of the tumor marker test revealed 9.0.   02/29/2020 Tumor Marker   Patient's tumor was tested for the following markers: CA-`125 Results of the tumor marker test revealed 9.7.   05/28/2020 Tumor Marker   Patient's tumor was tested for the following markers: CA-125 Results of the tumor marker test revealed 9.5   08/16/2020 Tumor Marker   Patient's tumor was tested for the following markers: CA-125 Results of the tumor marker test revealed 10.9   11/29/2020 Tumor Marker   Patient's tumor was tested for the following markers: CA-125. Results of the tumor marker test revealed 10.2.   01/22/2021 Pathology Results  SURGICAL PATHOLOGY  CASE: [id number]  PATIENT: Sampson Goon  Surgical Pathology Report   Clinical History: brain metastasis (cm)    FINAL MICROSCOPIC DIAGNOSIS:   A. 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    03/20/2021 Imaging   1. Unchanged enlarged midline superior mediastinal and left retroperitoneal lymph nodes, which remain suspicious for metastatic disease. 2. Unchanged masslike consolidation and volume loss of the right middle lobe measuring 3.8 x 1.9 cm, likely chronic scarring. Continued attention on follow-up. 3. No new evidence of metastatic disease in the chest, abdomen, or pelvis. 4. Status post hysterectomy and omentectomy.   Aortic Atherosclerosis (ICD10-I70.0).     03/21/2021 -  Chemotherapy   Patient is on Treatment Plan : OVARIAN RECURRENT 3RD LINE Carboplatin D1 / Gemcitabine D1,8 (4/800) q21d       PHYSICAL EXAMINATION: ECOG PERFORMANCE STATUS: 1 - Symptomatic but completely ambulatory  Vitals:   04/09/21 0756  BP: (!) 117/54  Pulse: 71  Resp: 18  Temp: 98.3 F (36.8 C)  SpO2: 99%   Filed Weights   04/09/21 0756  Weight: 101 lb 9.6 oz (46.1 kg)    GENERAL:alert, no distress and comfortable SKIN: skin color, texture, turgor are normal, no rashes or significant lesions EYES: normal, Conjunctiva are pink and non-injected, sclera clear OROPHARYNX:no exudate, no erythema and lips, buccal mucosa, and tongue normal  NECK: supple, thyroid normal size, non-tender, without nodularity LYMPH:  no palpable lymphadenopathy in the cervical, axillary or inguinal LUNGS: clear to auscultation and percussion with normal breathing effort HEART: regular rate & rhythm and no murmurs and no lower extremity edema ABDOMEN:abdomen soft, non-tender and normal bowel sounds Musculoskeletal:no cyanosis of digits and no clubbing  NEURO: alert & oriented x 3 with fluent speech, no focal motor/sensory deficits  LABORATORY DATA:  I have reviewed the data as listed    Component Value Date/Time   NA 140 04/09/2021  0741   K 3.4 (L) 04/09/2021 0741   CL 104 04/09/2021 0741   CO2 29 04/09/2021 0741   GLUCOSE 86 04/09/2021 0741   BUN 15 04/09/2021 0741   CREATININE 0.51 04/09/2021 0741   CALCIUM 9.2 04/09/2021 0741   PROT 6.7 04/09/2021 0741   ALBUMIN 4.2 04/09/2021 0741   AST 19 04/09/2021 0741   ALT 19 04/09/2021 0741   ALKPHOS 47 04/09/2021 0741   BILITOT 0.3 04/09/2021 0741   GFRNONAA >60 04/09/2021 0741   GFRAA >60 12/05/2019 1145    No results found for: SPEP, UPEP  Lab Results  Component Value Date   WBC 4.9 04/09/2021   NEUTROABS 2.0 04/09/2021   HGB 10.5 (L) 04/09/2021   HCT 32.1 (L) 04/09/2021   MCV 91.2 04/09/2021   PLT 286 04/09/2021      Chemistry      Component Value Date/Time   NA 140 04/09/2021 0741   K 3.4 (L) 04/09/2021 0741   CL 104 04/09/2021 0741   CO2 29 04/09/2021 0741   BUN 15 04/09/2021 0741   CREATININE 0.51 04/09/2021 0741      Component Value Date/Time   CALCIUM 9.2 04/09/2021 0741   ALKPHOS 47 04/09/2021 0741   AST 19 04/09/2021 0741   ALT 19 04/09/2021 0741   BILITOT 0.3 04/09/2021 0741

## 2021-04-09 NOTE — Assessment & Plan Note (Signed)
Overall, she tolerated treatment well except for mild fatigue and anemia which is not unexpected We will proceed with treatment without delay I recommend minimum 3 to 4 cycles of treatment before repeating imaging study

## 2021-04-09 NOTE — Assessment & Plan Note (Signed)
This is likely due to recent treatment. The patient denies recent history of bleeding such as epistaxis, hematuria or hematochezia. She is asymptomatic from the anemia. I will observe for now.   

## 2021-04-09 NOTE — Patient Instructions (Signed)
[facility]   Discharge Instructions: Thank you for choosing [facility] to provide your oncology and hematology care.   If you have a lab appointment with the [facility], please go directly to the [facility] and check in at the registration area.   Wear comfortable clothing and clothing appropriate for easy access to any Portacath or PICC line.   We strive to give you quality time with your provider. You may need to reschedule your appointment if you arrive late (15 or more minutes).  Arriving late affects you and other patients whose appointments are after yours.  Also, if you miss three or more appointments without notifying the office, you may be dismissed from the clinic at the providers discretion.      For prescription refill requests, have your pharmacy contact our office and allow 72 hours for refills to be completed.    Today you received the following chemotherapy and/or immunotherapy agents: Gemcitabine (Gemzar) and Carboplatin      To help prevent nausea and vomiting after your treatment, we encourage you to take your nausea medication as directed.  BELOW ARE SYMPTOMS THAT SHOULD BE REPORTED IMMEDIATELY: *FEVER GREATER THAN 100.4 F (38 C) OR HIGHER *CHILLS OR SWEATING *NAUSEA AND VOMITING THAT IS NOT CONTROLLED WITH YOUR NAUSEA MEDICATION *UNUSUAL SHORTNESS OF BREATH *UNUSUAL BRUISING OR BLEEDING *URINARY PROBLEMS (pain or burning when urinating, or frequent urination) *BOWEL PROBLEMS (unusual diarrhea, constipation, pain near the anus) TENDERNESS IN MOUTH AND THROAT WITH OR WITHOUT PRESENCE OF ULCERS (sore throat, sores in mouth, or a toothache) UNUSUAL RASH, SWELLING OR PAIN  UNUSUAL VAGINAL DISCHARGE OR ITCHING   Items with * indicate a potential emergency and should be followed up as soon as possible or go to the Emergency Department if any problems should occur.  Please show the CHEMOTHERAPY ALERT CARD or  IMMUNOTHERAPY ALERT CARD at check-in to the Emergency Department and triage nurse.  Should you have questions after your visit or need to cancel or reschedule your appointment, please contact [facility]  Dept: [phone]  and follow the prompts.  Office hours are 8:00 a.m. to 4:30 p.m. Monday - Friday. Please note that voicemails left after 4:00 p.m. may not be returned until the following business day.  We are closed weekends and major holidays. You have access to a nurse at all times for urgent questions. Please call the main number to the clinic Dept: [phone] and follow the prompts.   For any non-urgent questions, you may also contact your provider using MyChart. We now offer e-Visits for anyone 92 and older to request care online for non-urgent symptoms. For details visit [email or web address].   Also download the MyChart app! Go to the app store, search "MyChart", open the app, select [facility], and log in with your MyChart username and password.  Due to Covid, a mask is required upon entering the hospital/clinic. If you do not have a mask, one will be given to you upon arrival. For doctor visits, patients may have 1 support person aged 79 or older with them. For treatment visits, patients cannot have anyone with them due to current Covid guidelines and our immunocompromised population.

## 2021-04-09 NOTE — Progress Notes (Signed)
[facility] Spiritual Care Note  Met [name] [name] in infusion per referral from [facility]. [name] [name] was very welcoming of pastoral support and shared an introduction to her clinical situation and coping tools. We plan to follow up with an appointment in my office on a day when she will already be on campus for other appointments in order to economize on rides. I will phone her with suggested times.   [place], [place], [facility] Pager [phone] Voicemail [phone]

## 2021-04-12 ENCOUNTER — Telehealth: Payer: Self-pay

## 2021-04-12 NOTE — Telephone Encounter (Signed)
-----   Message from Ventura Sellers, MD sent at 04/12/2021  8:56 AM EST ----- Regarding: RE: Symptoms Im on PAL today, traveling.  She should seek out ED care with any new or concerning symptoms ----- Message ----- From: Doreen Salvage, LPN Sent: [id number]   8:48 AM EST To: Ventura Sellers, MD Subject: Symptoms                                       Good morning! This pt called the office stating she is having visible face twitches on the left side. She says this used to happen before her seizures started. She wants you to give her a call at [phone] when you are available. Thank you!

## 2021-04-12 NOTE — Telephone Encounter (Signed)
Pt is aware Dr. Mickeal Skinner is not in the office today and that she needs to go to the ED for any new or concerning symptoms. She states she feels fine right now and that the twitching to her left side of her face comes and goes. Pt will seek out ED care when necessary she states.

## 2021-04-12 NOTE — Telephone Encounter (Signed)
Pt called stating she is experiencing left side facial twitches and asked if this LPN can let Dr. Mickeal Skinner know to give her a call at [phone] as soon as he is available. She stated this used to happen before her seizures started to occur. This LPN notified Dr. Mickeal Skinner via inbasket.

## 2021-04-16 ENCOUNTER — Inpatient Hospital Stay: Payer: Medicare Other

## 2021-04-16 ENCOUNTER — Inpatient Hospital Stay (HOSPITAL_BASED_OUTPATIENT_CLINIC_OR_DEPARTMENT_OTHER): Payer: Medicare Other | Admitting: Hematology and Oncology

## 2021-04-16 ENCOUNTER — Encounter: Payer: Self-pay | Admitting: General Practice

## 2021-04-16 ENCOUNTER — Encounter: Payer: Self-pay | Admitting: Hematology and Oncology

## 2021-04-16 ENCOUNTER — Other Ambulatory Visit: Payer: Self-pay

## 2021-04-16 DIAGNOSIS — D61818 Other pancytopenia: Secondary | ICD-10-CM | POA: Diagnosis not present

## 2021-04-16 DIAGNOSIS — D6481 Anemia due to antineoplastic chemotherapy: Secondary | ICD-10-CM | POA: Diagnosis not present

## 2021-04-16 DIAGNOSIS — T451X5A Adverse effect of antineoplastic and immunosuppressive drugs, initial encounter: Secondary | ICD-10-CM | POA: Diagnosis not present

## 2021-04-16 DIAGNOSIS — R911 Solitary pulmonary nodule: Secondary | ICD-10-CM | POA: Diagnosis not present

## 2021-04-16 DIAGNOSIS — C482 Malignant neoplasm of peritoneum, unspecified: Secondary | ICD-10-CM

## 2021-04-16 DIAGNOSIS — H538 Other visual disturbances: Secondary | ICD-10-CM

## 2021-04-16 DIAGNOSIS — G514 Facial myokymia: Secondary | ICD-10-CM | POA: Diagnosis not present

## 2021-04-16 DIAGNOSIS — R569 Unspecified convulsions: Secondary | ICD-10-CM | POA: Diagnosis not present

## 2021-04-16 DIAGNOSIS — G620 Drug-induced polyneuropathy: Secondary | ICD-10-CM | POA: Diagnosis not present

## 2021-04-16 DIAGNOSIS — M62838 Other muscle spasm: Secondary | ICD-10-CM | POA: Diagnosis not present

## 2021-04-16 DIAGNOSIS — C7931 Secondary malignant neoplasm of brain: Secondary | ICD-10-CM | POA: Diagnosis not present

## 2021-04-16 DIAGNOSIS — Z5111 Encounter for antineoplastic chemotherapy: Secondary | ICD-10-CM | POA: Diagnosis not present

## 2021-04-16 LAB — CMP (CANCER CENTER ONLY)
ALT: 26 U/L (ref 0–44)
AST: 22 U/L (ref 15–41)
Albumin: 4.2 g/dL (ref 3.5–5.0)
Alkaline Phosphatase: 43 U/L (ref 38–126)
Anion gap: 8 (ref 5–15)
BUN: 13 mg/dL (ref 8–23)
CO2: 29 mmol/L (ref 22–32)
Calcium: 9.3 mg/dL (ref 8.9–10.3)
Chloride: 101 mmol/L (ref 98–111)
Creatinine: 0.52 mg/dL (ref 0.44–1.00)
GFR, Estimated: >60 mL/min (ref >60)
Glucose, Bld: 90 mg/dL (ref 70–99)
Potassium: 3.4 mmol/L — ABNORMAL LOW (ref 3.5–5.1)
Sodium: 138 mmol/L (ref 135–145)
Total Bilirubin: 0.3 mg/dL (ref 0.3–1.2)
Total Protein: 6.8 g/dL (ref 6.5–8.1)

## 2021-04-16 LAB — CBC WITH DIFFERENTIAL (CANCER CENTER ONLY)
Abs Immature Granulocytes: 0.02 10*3/uL (ref 0.00–0.07)
Basophils Absolute: 0 10*3/uL (ref 0.0–0.1)
Basophils Relative: 1 %
Eosinophils Absolute: 0 10*3/uL (ref 0.0–0.5)
Eosinophils Relative: 1 %
HCT: 30.9 % — ABNORMAL LOW (ref 36.0–46.0)
Hemoglobin: 10.4 g/dL — ABNORMAL LOW (ref 12.0–15.0)
Immature Granulocytes: 1 %
Lymphocytes Relative: 52 %
Lymphs Abs: 2.2 10*3/uL (ref 0.7–4.0)
MCH: 30.6 pg (ref 26.0–34.0)
MCHC: 33.7 g/dL (ref 30.0–36.0)
MCV: 90.9 fL (ref 80.0–100.0)
Monocytes Absolute: 0.3 10*3/uL (ref 0.1–1.0)
Monocytes Relative: 8 %
Neutro Abs: 1.5 10*3/uL — ABNORMAL LOW (ref 1.7–7.7)
Neutrophils Relative %: 37 %
Platelet Count: 330 10*3/uL (ref 150–400)
RBC: 3.4 MIL/uL — ABNORMAL LOW (ref 3.87–5.11)
RDW: 15.6 % — ABNORMAL HIGH (ref 11.5–15.5)
Smear Review: NORMAL
WBC Count: 4.1 10*3/uL (ref 4.0–10.5)
nRBC: 0 % (ref 0.0–0.2)

## 2021-04-16 MED ORDER — SODIUM CHLORIDE 0.9% FLUSH
10.0000 mL | INTRAVENOUS | Status: DC | PRN
  Administered 2021-04-16: 10 mL

## 2021-04-16 MED ORDER — SODIUM CHLORIDE 0.9 % IV SOLN: Freq: Once | INTRAVENOUS | Status: AC

## 2021-04-16 MED ORDER — LORAZEPAM 0.5 MG PO TABS: 0.5000 mg | ORAL_TABLET | Freq: Two times a day (BID) | ORAL | 0 refills | Status: DC | PRN

## 2021-04-16 MED ORDER — HEPARIN SOD (PORK) LOCK FLUSH 100 UNIT/ML IV SOLN
500.0000 [IU] | Freq: Once | INTRAVENOUS | Status: AC | PRN
  Administered 2021-04-16: 500 [IU]

## 2021-04-16 MED ORDER — PROCHLORPERAZINE MALEATE 10 MG PO TABS
10.0000 mg | ORAL_TABLET | Freq: Once | ORAL | Status: AC
  Administered 2021-04-16: 10 mg via ORAL
  Filled 2021-04-16: qty 1

## 2021-04-16 MED ORDER — SODIUM CHLORIDE 0.9 % IV SOLN
800.0000 mg/m2 | Freq: Once | INTRAVENOUS | Status: AC
  Administered 2021-04-16: 1178 mg via INTRAVENOUS
  Filled 2021-04-16: qty 30.98

## 2021-04-16 MED ORDER — SODIUM CHLORIDE 0.9% FLUSH
10.0000 mL | Freq: Once | INTRAVENOUS | Status: AC
  Administered 2021-04-16: 10 mL

## 2021-04-16 NOTE — Assessment & Plan Note (Signed)
Overall, she tolerated treatment fairly well except for expected side effects such as mild pancytopenia and changes in bowel habits The plan will be to proceed with treatment as scheduled I plan to repeat imaging study around March

## 2021-04-16 NOTE — Assessment & Plan Note (Signed)
This is an expected side effects of treatment We will proceed with treatment without delay

## 2021-04-16 NOTE — Assessment & Plan Note (Signed)
She has mild intermittent tingling sensation at night I suspect this is residual neuropathy from prior treatment Observe closely

## 2021-04-16 NOTE — Assessment & Plan Note (Signed)
She had recent ophthalmology review Observe closely

## 2021-04-16 NOTE — Progress Notes (Signed)
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[facility]) Overall, she tolerated treatment fairly well except for expected side effects such as mild pancytopenia and changes in bowel habits The plan will be to proceed with treatment as scheduled I plan to repeat imaging study around March  Pancytopenia, acquired [facility]) This is an expected side effects of treatment We will proceed with treatment without delay  Peripheral neuropathy due to chemotherapy [facility]) She has mild intermittent tingling sensation at night I suspect this is residual neuropathy from prior treatment Observe closely  Blurred vision She had recent ophthalmology review Observe closely  Facial twitching Cause unknown, could be due to residual side effects from radiation I do not believe the patient have ongoing seizures Muscle spasm can also cause her facial to treat We discussed the risk and benefits of trial of lorazepam as needed and she is in agreement to try  No orders of the defined types were placed in this encounter.   All questions were answered. The patient knows to call the clinic with any problems, questions or concerns. The total time spent in the appointment was 30 minutes encounter with patients including review of chart and various tests results, discussions about plan of care and coordination of care plan   [name], MD 04/16/2021 10:38 AM  INTERVAL HISTORY: Please see below for problem oriented charting. she returns for treatment follow-up seen prior to day 8 of treatment She had slightly more side effects with last cycle She had recent ophthalmology exam for blurriness of vision and was told it checked out okay She denies double vision She has intermittent twitches on  her left side of the face She had mild constipation that resolved She continues to have mild balance difficulties She had transient thumb inflammation after treatment that resolved She has very mild tingling sensation in her feet that resolved  REVIEW OF SYSTEMS:   Constitutional: Denies fevers, chills or abnormal weight loss Ears, nose, mouth, throat, and face: Denies mucositis or sore throat Respiratory: Denies cough, dyspnea or wheezes Cardiovascular: Denies palpitation, chest discomfort or lower extremity swelling Skin: Denies abnormal skin rashes Lymphatics: Denies new lymphadenopathy or easy bruising Behavioral/Psych: Mood is stable, no new changes  All other systems were reviewed with the patient and are negative.  I have reviewed the past medical history, past surgical history, social history and family history with the patient and they are unchanged from previous note.  ALLERGIES:  is allergic to fentanyl, vancomycin, lac bovis, brimonidine tartrate, ditropan [oxybutynin], milk-related compounds, other, oxybutynin chloride, thimerosal, tramadol, avelox [moxifloxacin hcl in nacl], doxycycline, erythromycin, metronidazole, minocycline, penicillins, quinolones, and sulfonamide derivatives.  MEDICATIONS:  Current Outpatient Medications  Medication Sig Dispense Refill   cholecalciferol (VITAMIN D3) 25 MCG (1000 UNIT) tablet Take 2,000 Units by mouth daily.     LORazepam (ATIVAN) 0.5 MG tablet Take 1 tablet (0.5 mg total) by mouth 2 (two) times daily as needed for seizure. 20 tablet 0   Azelaic Acid 15 % cream Apply 1 application topically daily.     dexamethasone (DECADRON) 1 MG tablet Take 1 tablet (1 mg total) by mouth daily with breakfast. 60 tablet 1   gabapentin (NEURONTIN) 300 MG capsule Take 300-600 mg by mouth See admin instructions. Taking 600 mg in am ;300 mg  at lunch; 300 mg at hs.     levETIRAcetam (KEPPRA) 500 MG tablet TAKE 2 TABLETS BY MOUTH TWICE DAILY 120 tablet 1    Magnesium 250 MG TABS Take 250 mg by mouth daily.     ondansetron (ZOFRAN) 8 MG tablet Take 1 tablet (8 mg total) by mouth every 8 (eight) hours as needed. 30 tablet 1   Probiotic Product (PROBIOTIC DAILY PO) Take 1 capsule by mouth daily.     prochlorperazine (COMPAZINE) 10 MG tablet Take 1 tablet (10 mg total) by mouth every 6 (six) hours as needed (Nausea or vomiting). 30 tablet 1   Sulfacetamide Sodium, Acne, 10 % LOTN Apply 1 application topically at bedtime.     VITAMIN E PO Take 50 Int'l Units/day by mouth daily.     No current facility-administered medications for this visit.   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 Findings highly suspicious for peritoneal carcinomatosis. Recommend paracentesis for therapeutic and diagnostic purposes. I do not see an obvious primary lesion but there is some irregular enhancing soft tissue in the right adnexal area. CA 125 level may be helpful. 2. Mild surface irregularity involving the liver but I do not see any obvious changes of cirrhosis. No hepatic lesions.   11/23/2018 Tumor Marker   Patient's tumor was tested for the following markers: CA-125 Results of the tumor marker test revealed 253.   11/25/2018 Initial Diagnosis   Peritoneal carcinoma ([place])   11/25/2018 Imaging   US paracentesis Successful ultrasound-guided paracentesis  yielding 3.4 L of peritoneal fluid.   12/01/2018 Cancer Staging   Staging form: Ovary, Fallopian Tube, and Primary Peritoneal Carcinoma, AJCC 8th Edition - Clinical stage from 12/01/2018: Stage IVA (rcT3, cN0, pM1a) - Signed by [name], MD on 02/14/2021 Stage prefix: Recurrence    12/02/2018 Tumor Marker   Patient's tumor was tested for the following markers: CA-125 Results of the tumor marker test revealed 267   12/06/2018 - 05/16/2019 Chemotherapy   The patient had carboplatin and taxol for chemotherapy treatment.     12/08/2018 Procedure   Successful ultrasound-guided therapeutic paracentesis yielding 1.7 liters of peritoneal fluid.   12/24/2018 Procedure   Successful placement of a right IJ approach Power Port with ultrasound and fluoroscopic guidance. The catheter is ready for use   01/17/2019 Tumor Marker   Patient's tumor was tested for the following markers: CA-125 Results of the tumor marker test revealed 57.2   01/31/2019 Imaging   Significant decrease in peritoneal carcinomatosis since previous study. Interval resolution of ascites.   Focal area of parenchymal consolidation in central right middle lobe, which measures 3 cm. Differential diagnosis includes infectious or inflammatory process, atelectasis, and neoplasm. Recommend short-term follow-up by chest CT in 2-3 months.     02/17/2019 Surgery   Surgeon:  Donaciano Eva     Pre-operative Diagnosis: primary peritoneal cancer stage IIIC    Operation: Robotic-assisted laparoscopic total hysterectomy with bilateral salpingoophorectomy, omentectomy, radical tumor debulking, minilaparotomy for omentectomy.   Surgeon: Donaciano Eva    Operative Findings:  : grossly normal uterus, ovaries normal, few scattered peritoneal nodules (68m) on serosa of uterus and tubes. The omentum (gastrocolic) was tethered to the mesentery with tumor (thin rind). Complete (optimal) resection of tumor with no gross residual disease.      02/17/2019 Pathology Results   FINAL MICROSCOPIC DIAGNOSIS: A. UTERUS, CERVIX, BILATERAL FALLOPIAN TUBES AND OVARIES, HYSTERECTOMY WITH SALPINGOOOPHORECTOMY: - Uterus: Endometrium: Inactive endometrium. No hyperplasia or malignancy. Myometrium: Unremarkable. No malignancy. Serosa: Metastatic carcinoma. No malignancy. - Cervix: Benign squamous and endocervical mucosa. No dysplasia or malignancy. - Left ovary and fallopian tube: Metastatic carcinoma. - Right ovary: No malignancy identified. - Right fallopian tube: Luminal tumor, see comment. B. OMENTUM, RESECTION: - High grade serous carcinoma. - Deposits up to at least 3 cm. - See oncology table. ONCOLOGY TABLE: OVARY or FALLOPIAN TUBE or PRIMARY PERITONEUM: Procedure: Hysterectomy with bilateral salpingo-oophorectomy and omentectomy. Specimen Integrity: Intact Tumor Site: Peritoneum Ovarian Surface Involvement (required only if applicable): Left ovary Fallopian Tube Surface Involvement (required only if applicable): Left Fallopian tube Tumor Size: Largest deposit 3 cm Histologic Type: High-grade serous carcinoma Histologic Grade: High-grade Implants (required for advanced stage serous/seromucinous borderline tumors only): Uterine serosa, left fallopian tube and ovary, omentum Other Tissue/ Organ Involvement: As above Largest Extrapelvic Peritoneal Focus (required only if applicable): 3 cm Peritoneal/Ascitic Fluid: Pre neoadjuvant [id number] positive for carcinoma. Treatment Effect (required only for high-grade serous carcinomas): Probably partial treatment effect in omental tissue. Regional Lymph Nodes: No lymph nodes submitted or found Pathologic Stage Classification (pTNM, AJCC 8th Edition): ypT3c, ypNX Representative Tumor Block: B5 Comment(s): There is surface involvement of the left ovary and fallopian tube but no primary tumor. Within the right fallopian tube lumen there is detached fragments of tumor but again no precursor  lesion is noted in the right fallopian tube. Thus, the tumor is presumed primary peritoneal and staged as such.   03/21/2019 Tumor Marker   Patient's tumor was tested for the following markers: CA-125 Results of the tumor marker test revealed 15.1    Genetic Testing   Negative genetic testing. No pathogenic variants identified on the [facility] CancerNext + RNAinsight panel. VUS in ATM called c.6007G>A identified. The report date is 05/12/2019. TumorNext HRD was originally ordered but there was not enough sample to complete this testing.   The CancerNext+RNAinsight gene panel offered by [employer] sequencing and rearrangement analysis for the following 36 genes: APC*, ATM*, AXIN2, BARD1, BMPR1A, BRCA1*, BRCA2*, BRIP1*, CDH1*, CDK4, CDKN2A, CHEK2*, DICER1, MLH1*, MSH2*, MSH3, MSH6*, MUTYH*, NBN, NF1*, NTHL1, PALB2*, PMS2*, PTEN*, RAD51C*, RAD51D*, RECQL, SMAD4, SMARCA4, STK11 and TP53* (sequencing and deletion/duplication); HOXB13, POLD1 and POLE (sequencing only); EPCAM and GREM1 (deletion/duplication only). DNA and RNA analyses performed for * genes.    05/16/2019 Tumor Marker   Patient's tumor was tested for the following markers: CA-125 Results of the tumor marker test revealed 10.7   06/16/2019 Imaging   1. No evidence of residual or recurrent metastatic disease in the chest, abdomen or pelvis. 2. Masslike focus of consolidation in the right middle lobe along the major and minor fissures with associated volume loss is stable since 01/31/2019. Indolent primary pulmonary neoplasm not excluded. PET-CT may be considered for further characterization. 3. Aortic Atherosclerosis (ICD10-I70.0).   06/16/2019 Tumor  Marker   Patient's tumor was tested for the following markers: CA-125 Results of the tumor marker test revealed 9.1.   12/05/2019 Tumor Marker   Patient's tumor was tested for the following markers: CA-125. Results of the tumor marker test revealed 9.0.   02/29/2020 Tumor Marker    Patient's tumor was tested for the following markers: CA-`125 Results of the tumor marker test revealed 9.7.   05/28/2020 Tumor Marker   Patient's tumor was tested for the following markers: CA-125 Results of the tumor marker test revealed 9.5   08/16/2020 Tumor Marker   Patient's tumor was tested for the following markers: CA-125 Results of the tumor marker test revealed 10.9   11/29/2020 Tumor Marker   Patient's tumor was tested for the following markers: CA-125. Results of the tumor marker test revealed 10.2.   01/22/2021 Pathology Results   SURGICAL PATHOLOGY  CASE: [phone]  PATIENT: Sampson Goon  Surgical Pathology Report   Clinical History: brain metastasis (cm)    FINAL MICROSCOPIC DIAGNOSIS:   A. 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    03/20/2021 Imaging   1. Unchanged enlarged midline superior mediastinal and left retroperitoneal lymph nodes, which remain suspicious for metastatic disease. 2. Unchanged masslike consolidation and volume loss of the right middle lobe measuring 3.8 x 1.9 cm, likely chronic scarring. Continued attention on follow-up. 3. No new evidence of metastatic disease in the chest, abdomen, or pelvis. 4. Status post hysterectomy and omentectomy.   Aortic Atherosclerosis (ICD10-I70.0).     03/21/2021 -  Chemotherapy   Patient is on Treatment Plan : OVARIAN RECURRENT 3RD LINE Carboplatin D1 / Gemcitabine D1,8 (4/800) q21d       PHYSICAL EXAMINATION: ECOG PERFORMANCE STATUS: 1 - Symptomatic but completely ambulatory  Vitals:   04/16/21 0801  BP: (!) 122/50  Pulse: 76  Resp: 16  Temp: 98.2 F (36.8 C)  SpO2: 99%   Filed Weights   04/16/21 0801  Weight: 101 lb 9.6 oz (46.1 kg)    GENERAL:alert, no distress and comfortable SKIN: skin color, texture, turgor are normal, no rashes or significant  lesions EYES: normal, Conjunctiva are pink and non-injected, sclera clear OROPHARYNX:no exudate, no erythema and lips, buccal mucosa, and tongue normal  NECK: supple, thyroid normal size, non-tender, without nodularity LYMPH:  no palpable lymphadenopathy in the cervical, axillary or inguinal LUNGS: clear to auscultation and percussion with normal breathing effort HEART: regular rate & rhythm and no murmurs and no lower extremity edema ABDOMEN:abdomen soft, non-tender and normal bowel sounds Musculoskeletal:no cyanosis of digits and no clubbing  NEURO: alert & oriented x 3 with fluent speech, no focal motor/sensory deficits  LABORATORY DATA:  I have reviewed the data as listed    Component Value Date/Time   NA 138 04/16/2021 0742   K 3.4 (L) 04/16/2021 0742   CL 101 04/16/2021 0742   CO2 29 04/16/2021 0742   GLUCOSE 90 04/16/2021 0742   BUN 13 04/16/2021 0742   CREATININE 0.52 04/16/2021 0742   CALCIUM 9.3 04/16/2021 0742   PROT 6.8 04/16/2021 0742   ALBUMIN 4.2 04/16/2021 0742   AST 22 04/16/2021 0742   ALT 26 04/16/2021 0742   ALKPHOS 43 04/16/2021 0742   BILITOT 0.3 04/16/2021 0742   GFRNONAA >60 04/16/2021 0742   GFRAA >60 12/05/2019 1145    No results found for: SPEP, UPEP  Lab  Results  Component Value Date   WBC 4.1 04/16/2021   NEUTROABS 1.5 (L) 04/16/2021   HGB 10.4 (L) 04/16/2021   HCT 30.9 (L) 04/16/2021   MCV 90.9 04/16/2021   PLT 330 04/16/2021      Chemistry      Component Value Date/Time   NA 138 04/16/2021 0742   K 3.4 (L) 04/16/2021 0742   CL 101 04/16/2021 0742   CO2 29 04/16/2021 0742   BUN 13 04/16/2021 0742   CREATININE 0.52 04/16/2021 0742      Component Value Date/Time   CALCIUM 9.3 04/16/2021 0742   ALKPHOS 43 04/16/2021 0742   AST 22 04/16/2021 0742   ALT 26 04/16/2021 0742   BILITOT 0.3 04/16/2021 0742

## 2021-04-16 NOTE — Progress Notes (Signed)
[facility] Spiritual Care Note  Followed up with Ms Kundert for [place] appointment in my office as planned, providing opportunity for her to process existential questions related to prognosis and tending to quality of life in the present and future. She notes that she is living with meaning and purpose in simple ways even with the limitations that the covid era has created.  Provided empathic listening, pastoral reflection, emotional support, and encouragement.  Ms Lauman plans to phone to schedule another appointment when she would again benefit from having a conversation partner about these themes.   [place], [place], [facility] Pager [phone] Voicemail [phone]

## 2021-04-16 NOTE — Patient Instructions (Signed)
[facility]  Discharge Instructions: Thank you for choosing [facility] to provide your oncology and hematology care.   If you have a lab appointment with the [facility], please go directly to the [facility] and check in at the registration area.   Wear comfortable clothing and clothing appropriate for easy access to any Portacath or PICC line.   We strive to give you quality time with your provider. You may need to reschedule your appointment if you arrive late (15 or more minutes).  Arriving late affects you and other patients whose appointments are after yours.  Also, if you miss three or more appointments without notifying the office, you may be dismissed from the clinic at the providers discretion.      For prescription refill requests, have your pharmacy contact our office and allow 72 hours for refills to be completed.    Today you received the following chemotherapy and/or immunotherapy agent: Gemcitabine    To help prevent nausea and vomiting after your treatment, we encourage you to take your nausea medication as directed.  BELOW ARE SYMPTOMS THAT SHOULD BE REPORTED IMMEDIATELY: *FEVER GREATER THAN 100.4 F (38 C) OR HIGHER *CHILLS OR SWEATING *NAUSEA AND VOMITING THAT IS NOT CONTROLLED WITH YOUR NAUSEA MEDICATION *UNUSUAL SHORTNESS OF BREATH *UNUSUAL BRUISING OR BLEEDING *URINARY PROBLEMS (pain or burning when urinating, or frequent urination) *BOWEL PROBLEMS (unusual diarrhea, constipation, pain near the anus) TENDERNESS IN MOUTH AND THROAT WITH OR WITHOUT PRESENCE OF ULCERS (sore throat, sores in mouth, or a toothache) UNUSUAL RASH, SWELLING OR PAIN  UNUSUAL VAGINAL DISCHARGE OR ITCHING   Items with * indicate a potential emergency and should be followed up as soon as possible or go to the Emergency Department if any problems should occur.  Please show the CHEMOTHERAPY ALERT CARD or IMMUNOTHERAPY ALERT CARD at check-in to  the Emergency Department and triage nurse.  Should you have questions after your visit or need to cancel or reschedule your appointment, please contact [facility]  Dept: [phone]  and follow the prompts.  Office hours are 8:00 a.m. to 4:30 p.m. Monday - Friday. Please note that voicemails left after 4:00 p.m. may not be returned until the following business day.  We are closed weekends and major holidays. You have access to a nurse at all times for urgent questions. Please call the main number to the clinic Dept: [phone] and follow the prompts.   For any non-urgent questions, you may also contact your provider using MyChart. We now offer e-Visits for anyone 94 and older to request care online for non-urgent symptoms. For details visit [email or web address].   Also download the MyChart app! Go to the app store, search "MyChart", open the app, select [facility], and log in with your MyChart username and password.  Due to Covid, a mask is required upon entering the hospital/clinic. If you do not have a mask, one will be given to you upon arrival. For doctor visits, patients may have 1 support person aged 109 or older with them. For treatment visits, patients cannot have anyone with them due to current Covid guidelines and our immunocompromised population.   Gemcitabine injection What is this medication? GEMCITABINE (jem SYE ta been) is a chemotherapy drug. This medicine is used to treat many types of cancer like breast cancer, lung cancer, pancreatic cancer, and ovarian cancer. This medicine may be used for other purposes; ask your health care provider or pharmacist if  you have questions. COMMON BRAND NAME(S): Gemzar, Infugem What should I tell my care team before I take this medication? They need to know if you have any of these conditions: blood disorders infection kidney disease liver disease lung or breathing disease, like asthma recent or ongoing  radiation therapy an unusual or allergic reaction to gemcitabine, other chemotherapy, other medicines, foods, dyes, or preservatives pregnant or trying to get pregnant breast-feeding How should I use this medication? This drug is given as an infusion into a vein. It is administered in a hospital or clinic by a specially trained health care professional. Talk to your pediatrician regarding the use of this medicine in children. Special care may be needed. Overdosage: If you think you have taken too much of this medicine contact a poison control center or emergency room at once. NOTE: This medicine is only for you. Do not share this medicine with others. What if I miss a dose? It is important not to miss your dose. Call your doctor or health care professional if you are unable to keep an appointment. What may interact with this medication? medicines to increase blood counts like filgrastim, pegfilgrastim, sargramostim some other chemotherapy drugs like cisplatin vaccines Talk to your doctor or health care professional before taking any of these medicines: acetaminophen aspirin ibuprofen ketoprofen naproxen This list may not describe all possible interactions. Give your health care provider a list of all the medicines, herbs, non-prescription drugs, or dietary supplements you use. Also tell them if you smoke, drink alcohol, or use illegal drugs. Some items may interact with your medicine. What should I watch for while using this medication? Visit your doctor for checks on your progress. This drug may make you feel generally unwell. This is not uncommon, as chemotherapy can affect healthy cells as well as cancer cells. Report any side effects. Continue your course of treatment even though you feel ill unless your doctor tells you to stop. In some cases, you may be given additional medicines to help with side effects. Follow all directions for their use. Call your doctor or health care  professional for advice if you get a fever, chills or sore throat, or other symptoms of a cold or flu. Do not treat yourself. This drug decreases your body's ability to fight infections. Try to avoid being around people who are sick. This medicine may increase your risk to bruise or bleed. Call your doctor or health care professional if you notice any unusual bleeding. Be careful brushing and flossing your teeth or using a toothpick because you may get an infection or bleed more easily. If you have any dental work done, tell your dentist you are receiving this medicine. Avoid taking products that contain aspirin, acetaminophen, ibuprofen, naproxen, or ketoprofen unless instructed by your doctor. These medicines may hide a fever. Do not become pregnant while taking this medicine or for 6 months after stopping it. Women should inform their doctor if they wish to become pregnant or think they might be pregnant. Men should not father a child while taking this medicine and for 3 months after stopping it. There is a potential for serious side effects to an unborn child. Talk to your health care professional or pharmacist for more information. Do not breast-feed an infant while taking this medicine or for at least 1 week after stopping it. Men should inform their doctors if they wish to father a child. This medicine may lower sperm counts. Talk with your doctor or health care professional if  you are concerned about your fertility. What side effects may I notice from receiving this medication? Side effects that you should report to your doctor or health care professional as soon as possible: allergic reactions like skin rash, itching or hives, swelling of the face, lips, or tongue breathing problems pain, redness, or irritation at site where injected signs and symptoms of a dangerous change in heartbeat or heart rhythm like chest pain; dizziness; fast or irregular heartbeat; palpitations; feeling faint or  lightheaded, falls; breathing problems signs of decreased platelets or bleeding - bruising, pinpoint red spots on the skin, black, tarry stools, blood in the urine signs of decreased red blood cells - unusually weak or tired, feeling faint or lightheaded, falls signs of infection - fever or chills, cough, sore throat, pain or difficulty passing urine signs and symptoms of kidney injury like trouble passing urine or change in the amount of urine signs and symptoms of liver injury like dark yellow or brown urine; general ill feeling or flu-like symptoms; light-colored stools; loss of appetite; nausea; right upper belly pain; unusually weak or tired; yellowing of the eyes or skin swelling of ankles, feet, hands Side effects that usually do not require medical attention (report to your doctor or health care professional if they continue or are bothersome): constipation diarrhea hair loss loss of appetite nausea rash vomiting This list may not describe all possible side effects. Call your doctor for medical advice about side effects. You may report side effects to FDA at 1-800-FDA-1088. Where should I keep my medication? This drug is given in a hospital or clinic and will not be stored at home. NOTE: This sheet is a summary. It may not cover all possible information. If you have questions about this medicine, talk to your doctor, pharmacist, or health care provider.  2022 [employer]/Gold Standard (2017-05-27 00:00:00)

## 2021-04-16 NOTE — Assessment & Plan Note (Signed)
Cause unknown, could be due to residual side effects from radiation I do not believe the patient have ongoing seizures Muscle spasm can also cause her facial to treat We discussed the risk and benefits of trial of lorazepam as needed and she is in agreement to try

## 2021-04-29 MED FILL — Dexamethasone Sodium Phosphate Inj 100 MG/10ML: INTRAMUSCULAR | Qty: 1 | Status: AC

## 2021-04-29 MED FILL — Fosaprepitant Dimeglumine For IV Infusion 150 MG (Base Eq): INTRAVENOUS | Qty: 5 | Status: AC

## 2021-04-30 ENCOUNTER — Inpatient Hospital Stay: Payer: Medicare Other | Attending: Gynecologic Oncology

## 2021-04-30 ENCOUNTER — Inpatient Hospital Stay (HOSPITAL_BASED_OUTPATIENT_CLINIC_OR_DEPARTMENT_OTHER): Payer: Medicare Other | Admitting: Hematology and Oncology

## 2021-04-30 ENCOUNTER — Inpatient Hospital Stay: Payer: Medicare Other

## 2021-04-30 ENCOUNTER — Encounter: Payer: Self-pay | Admitting: Hematology and Oncology

## 2021-04-30 ENCOUNTER — Other Ambulatory Visit: Payer: Self-pay

## 2021-04-30 ENCOUNTER — Inpatient Hospital Stay: Payer: Medicare Other | Admitting: Nutrition

## 2021-04-30 VITALS — BP 99/45 | HR 70

## 2021-04-30 VITALS — BP 122/49 | HR 65 | Temp 98.0°F | Resp 18 | Ht 65.0 in | Wt 105.2 lb

## 2021-04-30 DIAGNOSIS — T451X5A Adverse effect of antineoplastic and immunosuppressive drugs, initial encounter: Secondary | ICD-10-CM | POA: Insufficient documentation

## 2021-04-30 DIAGNOSIS — T7840XA Allergy, unspecified, initial encounter: Secondary | ICD-10-CM | POA: Diagnosis not present

## 2021-04-30 DIAGNOSIS — Z5111 Encounter for antineoplastic chemotherapy: Secondary | ICD-10-CM | POA: Diagnosis not present

## 2021-04-30 DIAGNOSIS — R253 Fasciculation: Secondary | ICD-10-CM | POA: Diagnosis not present

## 2021-04-30 DIAGNOSIS — D6481 Anemia due to antineoplastic chemotherapy: Secondary | ICD-10-CM | POA: Insufficient documentation

## 2021-04-30 DIAGNOSIS — C482 Malignant neoplasm of peritoneum, unspecified: Secondary | ICD-10-CM | POA: Diagnosis not present

## 2021-04-30 DIAGNOSIS — I952 Hypotension due to drugs: Secondary | ICD-10-CM | POA: Diagnosis not present

## 2021-04-30 DIAGNOSIS — R0789 Other chest pain: Secondary | ICD-10-CM | POA: Diagnosis not present

## 2021-04-30 DIAGNOSIS — H538 Other visual disturbances: Secondary | ICD-10-CM | POA: Insufficient documentation

## 2021-04-30 LAB — CMP (CANCER CENTER ONLY)
ALT: 18 U/L (ref 0–44)
AST: 19 U/L (ref 15–41)
Albumin: 4.1 g/dL (ref 3.5–5.0)
Alkaline Phosphatase: 44 U/L (ref 38–126)
Anion gap: 7 (ref 5–15)
BUN: 11 mg/dL (ref 8–23)
CO2: 30 mmol/L (ref 22–32)
Calcium: 9.3 mg/dL (ref 8.9–10.3)
Chloride: 105 mmol/L (ref 98–111)
Creatinine: 0.49 mg/dL (ref 0.44–1.00)
GFR, Estimated: >60 mL/min (ref >60)
Glucose, Bld: 94 mg/dL (ref 70–99)
Potassium: 3.3 mmol/L — ABNORMAL LOW (ref 3.5–5.1)
Sodium: 142 mmol/L (ref 135–145)
Total Bilirubin: 0.3 mg/dL (ref 0.3–1.2)
Total Protein: 6.6 g/dL (ref 6.5–8.1)

## 2021-04-30 LAB — CBC WITH DIFFERENTIAL (CANCER CENTER ONLY)
Abs Immature Granulocytes: 0.02 10*3/uL (ref 0.00–0.07)
Basophils Absolute: 0 10*3/uL (ref 0.0–0.1)
Basophils Relative: 1 %
Eosinophils Absolute: 0.1 10*3/uL (ref 0.0–0.5)
Eosinophils Relative: 1 %
HCT: 31.4 % — ABNORMAL LOW (ref 36.0–46.0)
Hemoglobin: 10.4 g/dL — ABNORMAL LOW (ref 12.0–15.0)
Immature Granulocytes: 0 %
Lymphocytes Relative: 36 %
Lymphs Abs: 2.2 10*3/uL (ref 0.7–4.0)
MCH: 31.1 pg (ref 26.0–34.0)
MCHC: 33.1 g/dL (ref 30.0–36.0)
MCV: 94 fL (ref 80.0–100.0)
Monocytes Absolute: 0.9 10*3/uL (ref 0.1–1.0)
Monocytes Relative: 14 %
Neutro Abs: 3 10*3/uL (ref 1.7–7.7)
Neutrophils Relative %: 48 %
Platelet Count: 270 10*3/uL (ref 150–400)
RBC: 3.34 MIL/uL — ABNORMAL LOW (ref 3.87–5.11)
RDW: 18.1 % — ABNORMAL HIGH (ref 11.5–15.5)
WBC Count: 6.2 10*3/uL (ref 4.0–10.5)
nRBC: 0 % (ref 0.0–0.2)

## 2021-04-30 MED ORDER — FAMOTIDINE IN NACL 20-0.9 MG/50ML-% IV SOLN
20.0000 mg | Freq: Once | INTRAVENOUS | Status: AC
  Administered 2021-04-30: 20 mg via INTRAVENOUS

## 2021-04-30 MED ORDER — SODIUM CHLORIDE 0.9 % IV SOLN
800.0000 mg/m2 | Freq: Once | INTRAVENOUS | Status: AC
  Administered 2021-04-30: 1178 mg via INTRAVENOUS
  Filled 2021-04-30: qty 30.98

## 2021-04-30 MED ORDER — SODIUM CHLORIDE 0.9 % IV SOLN
242.5730 mg | Freq: Once | INTRAVENOUS | Status: AC
  Administered 2021-04-30: 240 mg via INTRAVENOUS
  Filled 2021-04-30: qty 24

## 2021-04-30 MED ORDER — HEPARIN SOD (PORK) LOCK FLUSH 100 UNIT/ML IV SOLN
500.0000 [IU] | Freq: Once | INTRAVENOUS | Status: AC | PRN
  Administered 2021-04-30: 500 [IU]

## 2021-04-30 MED ORDER — SODIUM CHLORIDE 0.9 % IV SOLN
150.0000 mg | Freq: Once | INTRAVENOUS | Status: AC
  Administered 2021-04-30: 150 mg via INTRAVENOUS
  Filled 2021-04-30: qty 150

## 2021-04-30 MED ORDER — METHYLPREDNISOLONE SODIUM SUCC 125 MG IJ SOLR
125.0000 mg | Freq: Once | INTRAMUSCULAR | Status: AC
  Administered 2021-04-30: 125 mg via INTRAVENOUS

## 2021-04-30 MED ORDER — SODIUM CHLORIDE 0.9 % IV SOLN: Freq: Once | INTRAVENOUS | Status: AC

## 2021-04-30 MED ORDER — DIPHENHYDRAMINE HCL 50 MG/ML IJ SOLN
12.5000 mg | Freq: Once | INTRAMUSCULAR | Status: AC
  Administered 2021-04-30: 12.5 mg via INTRAVENOUS

## 2021-04-30 MED ORDER — PALONOSETRON HCL INJECTION 0.25 MG/5ML
0.2500 mg | Freq: Once | INTRAVENOUS | Status: AC
  Administered 2021-04-30: 0.25 mg via INTRAVENOUS
  Filled 2021-04-30: qty 5

## 2021-04-30 MED ORDER — SODIUM CHLORIDE 0.9 % IV SOLN
10.0000 mg | Freq: Once | INTRAVENOUS | Status: AC
  Administered 2021-04-30: 10 mg via INTRAVENOUS
  Filled 2021-04-30: qty 10

## 2021-04-30 MED ORDER — SODIUM CHLORIDE 0.9% FLUSH
10.0000 mL | INTRAVENOUS | Status: DC | PRN
  Administered 2021-04-30: 10 mL

## 2021-04-30 MED ORDER — SODIUM CHLORIDE 0.9% FLUSH
10.0000 mL | Freq: Once | INTRAVENOUS | Status: AC
  Administered 2021-04-30: 10 mL

## 2021-04-30 NOTE — Progress Notes (Signed)
Hypersensitivity Reaction note  Date of event: 04/30/21 Time of event: 1217 Generic name of drug involved: carboplatin Name of provider notified of the hypersensitivity reaction: Alvy Bimler Was agent that likely caused hypersensitivity reaction added to Allergies List within EMR? yes Chain of events including reaction signs/symptoms, treatment administered, and outcome (e.g., drug resumed; drug discontinued; sent to Emergency Department; etc.) At approximately 1217 patient c/o itching to roof of mouth and ears.  RN stopped carboplatin infusion and started NS 0.9% wide open.  VSS.  RN administered Pepcid 20mg  IVPB and notified Dr. Alvy Bimler of hypersensitivity reaction.  Patient them c/o burning to throat and chest, no c/o difficulty swallowing or breathing.  RN administered Solu-medrol 125mg .  Vitals retaken - BP decreased.  Dr. Alvy Bimler ordered 560ml NS bolus.  VSS - BP increased post bolus.  At 1257 patient c/o itching to neck and arms, RN noted rash to areas.  Dr. Alvy Bimler then ordered Benadryl 12.5mg  and remainder of 1L bag of NS at 938ml/hr (about 377ml) - RN administered.  Patient remained in observation to monitor improvement of rash.  Per Dr. Alvy Bimler, patient was able to discharge at 1430 if rash had improved and VSS.  Post-observation VSS.  Patient discharged with instructions to notify MD of any changes or proceed to ED if emergent.  Patient acknowledged understanding.  Wylene Men, RN 04/30/2021 2:39 PM

## 2021-04-30 NOTE — Assessment & Plan Note (Signed)
I spent over an hour managing her infusion reaction She received additional medications with Solu-Medrol, Pepcid, Benadryl once she developed allergic reaction Her initial reaction was chest tightness and lump in her throat and then progressed to develop diffuse rash and hypotension At the time of discharge, her chest tightness has resolved.  She has persistent sensation of a lump in her throat.  Skin rash is almost completely faded and her blood pressure has improved to a safe range prior to discharge I will call her tomorrow to check on her The patient is instructed to go to the emergency room if she have recurrence of her symptoms of chest tightness and inability to breathe

## 2021-04-30 NOTE — Progress Notes (Signed)
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[facility]) We spent significant amount of time today managing carboplatin infusion reaction The patient was subsequently discharged without significant residual side effects from the reaction She will proceed with CT imaging next month Depending on results of the CT imaging, she would either switch to maintenance bevacizumab, continuing on gemcitabine, add bevacizumab to gemcitabine or switch to PARP inhibitor I will see her again next week for further follow-up  Allergic reaction caused by a drug I spent over an hour managing her infusion reaction She received additional medications with Solu-Medrol, Pepcid, Benadryl once she developed allergic reaction Her initial reaction was chest tightness and lump in her throat and then progressed to develop diffuse rash and hypotension At the time of discharge, her chest tightness has resolved.  She has persistent sensation of a lump in her throat.  Skin rash is almost completely faded and her blood pressure has improved to a safe range prior to discharge I will call her tomorrow to check on her The patient is instructed to go to the emergency room if she have recurrence of her symptoms of chest tightness and inability to breathe  Anemia due to antineoplastic chemotherapy This is likely due to recent treatment. The patient denies recent history of bleeding such as epistaxis, hematuria or hematochezia. She is asymptomatic from the anemia. I will observe for now.    Orders Placed This Encounter  Procedures   CT CHEST ABDOMEN PELVIS W CONTRAST    Standing Status:   Future    Standing Expiration Date:   04/30/2022    Order Specific Question:   Preferred imaging location?    Answer:    [facility]    Order Specific Question:   Radiology Contrast Protocol - do NOT remove file path    Answer:   [email or web address]    All questions were answered. The patient knows to call the clinic with any problems, questions or concerns. The total time spent in the appointment was 80 minutes encounter with patients including review of chart and various tests results, discussions about plan of care and coordination of care plan   [name], MD 04/30/2021 3:33 PM  INTERVAL HISTORY: Please see below for problem oriented charting. she returns for treatment follow-up and was seen many times Initially, she was seen in the office prior to infusion and we had extensive discussion about the timing of her imaging study The patient received carboplatin and develop allergic reaction She had sensation of a lump in her throat, chest tightness, hypotension and diffuse skin rash She was seen multiple times in the infusion room and was monitored closely Her blood pressure improved subsequently with IV fluids She received several additional medications to counteract her allergic reaction  REVIEW OF SYSTEMS:   Constitutional: Denies fevers, chills or abnormal weight loss Respiratory: Denies cough, dyspnea or wheezes Cardiovascular: Denies palpitation, chest discomfort or lower extremity swelling Lymphatics: Denies new lymphadenopathy or easy bruising Neurological:Denies numbness, tingling or new weaknesses Behavioral/Psych: Mood is stable, no new changes  All other systems were reviewed with the patient and are negative.  I have reviewed the past medical history, past surgical history, social history and family history with the patient and they are unchanged from previous note.  ALLERGIES:  is  allergic to carboplatin, fentanyl, vancomycin, lac bovis, brimonidine tartrate, ditropan [oxybutynin], milk-related compounds, other, oxybutynin chloride,  thimerosal, tramadol, avelox [moxifloxacin hcl in nacl], doxycycline, erythromycin, metronidazole, minocycline, penicillins, quinolones, and sulfonamide derivatives.  MEDICATIONS:  Current Outpatient Medications  Medication Sig Dispense Refill   Azelaic Acid 15 % cream Apply 1 application topically daily.     cholecalciferol (VITAMIN D3) 25 MCG (1000 UNIT) tablet Take 2,000 Units by mouth daily.     dexamethasone (DECADRON) 1 MG tablet Take 1 tablet (1 mg total) by mouth daily with breakfast. 60 tablet 1   gabapentin (NEURONTIN) 300 MG capsule Take 300-600 mg by mouth See admin instructions. Taking 600 mg in am ;300 mg at lunch; 300 mg at hs.     levETIRAcetam (KEPPRA) 500 MG tablet TAKE 2 TABLETS BY MOUTH TWICE DAILY 120 tablet 1   Magnesium 250 MG TABS Take 250 mg by mouth daily.     ondansetron (ZOFRAN) 8 MG tablet Take 1 tablet (8 mg total) by mouth every 8 (eight) hours as needed. 30 tablet 1   Probiotic Product (PROBIOTIC DAILY PO) Take 1 capsule by mouth daily.     prochlorperazine (COMPAZINE) 10 MG tablet Take 1 tablet (10 mg total) by mouth every 6 (six) hours as needed (Nausea or vomiting). 30 tablet 1   Sulfacetamide Sodium, Acne, 10 % LOTN Apply 1 application topically at bedtime.     VITAMIN E PO Take 50 Int'l Units/day by mouth daily.     No current facility-administered medications for this visit.   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 Findings highly suspicious for peritoneal carcinomatosis. Recommend paracentesis for  therapeutic and diagnostic purposes. I do not see an obvious primary lesion but there is some irregular enhancing soft tissue in the right adnexal area. CA 125 level may be helpful. 2. Mild surface irregularity involving the liver but I do not see any obvious changes of cirrhosis. No hepatic lesions.   11/23/2018 Tumor Marker   Patient's tumor was tested for the following markers: CA-125 Results of the tumor marker test revealed 253.   11/25/2018 Initial Diagnosis   Peritoneal carcinoma ([place])   11/25/2018 Imaging   US paracentesis Successful ultrasound-guided paracentesis yielding 3.4 L of peritoneal fluid.   12/01/2018 Cancer Staging   Staging form: Ovary, Fallopian Tube, and Primary Peritoneal Carcinoma, AJCC 8th Edition - Clinical stage from 12/01/2018: Stage IVA (rcT3, cN0, pM1a) - Signed by [name], MD on 02/14/2021 Stage prefix: Recurrence    12/02/2018 Tumor Marker   Patient's tumor was tested for the following markers: CA-125 Results of the tumor marker test revealed 267   12/06/2018 - 05/16/2019 Chemotherapy   The patient had carboplatin and taxol for chemotherapy treatment.     12/08/2018 Procedure   Successful ultrasound-guided therapeutic paracentesis yielding 1.7 liters of peritoneal fluid.   12/24/2018 Procedure   Successful placement of a right IJ approach Power Port with ultrasound and fluoroscopic guidance. The catheter is ready for use   01/17/2019 Tumor Marker   Patient's tumor was  tested for the following markers: CA-125 Results of the tumor marker test revealed 57.2   01/31/2019 Imaging   Significant decrease in peritoneal carcinomatosis since previous study. Interval resolution of ascites.   Focal area of parenchymal consolidation in central right middle lobe, which measures 3 cm. Differential diagnosis includes infectious or inflammatory process, atelectasis, and neoplasm. Recommend short-term follow-up by chest CT in 2-3 months.     02/17/2019 Surgery    Surgeon: Donaciano Eva     Pre-operative Diagnosis: primary peritoneal cancer stage IIIC    Operation: Robotic-assisted laparoscopic total hysterectomy with bilateral salpingoophorectomy, omentectomy, radical tumor debulking, minilaparotomy for omentectomy.   Surgeon: Donaciano Eva    Operative Findings:  : grossly normal uterus, ovaries normal, few scattered peritoneal nodules (76m) on serosa of uterus and tubes. The omentum (gastrocolic) was tethered to the mesentery with tumor (thin rind). Complete (optimal) resection of tumor with no gross residual disease.     02/17/2019 Pathology Results   FINAL MICROSCOPIC DIAGNOSIS: A. UTERUS, CERVIX, BILATERAL FALLOPIAN TUBES AND OVARIES, HYSTERECTOMY WITH SALPINGOOOPHORECTOMY: - Uterus: Endometrium: Inactive endometrium. No hyperplasia or malignancy. Myometrium: Unremarkable. No malignancy. Serosa: Metastatic carcinoma. No malignancy. - Cervix: Benign squamous and endocervical mucosa. No dysplasia or malignancy. - Left ovary and fallopian tube: Metastatic carcinoma. - Right ovary: No malignancy identified. - Right fallopian tube: Luminal tumor, see comment. B. OMENTUM, RESECTION: - High grade serous carcinoma. - Deposits up to at least 3 cm. - See oncology table. ONCOLOGY TABLE: OVARY or FALLOPIAN TUBE or PRIMARY PERITONEUM: Procedure: Hysterectomy with bilateral salpingo-oophorectomy and omentectomy. Specimen Integrity: Intact Tumor Site: Peritoneum Ovarian Surface Involvement (required only if applicable): Left ovary Fallopian Tube Surface Involvement (required only if applicable): Left Fallopian tube Tumor Size: Largest deposit 3 cm Histologic Type: High-grade serous carcinoma Histologic Grade: High-grade Implants (required for advanced stage serous/seromucinous borderline tumors only): Uterine serosa, left fallopian tube and ovary, omentum Other Tissue/ Organ Involvement: As above Largest Extrapelvic Peritoneal Focus  (required only if applicable): 3 cm Peritoneal/Ascitic Fluid: Pre neoadjuvant [id number] positive for carcinoma. Treatment Effect (required only for high-grade serous carcinomas): Probably partial treatment effect in omental tissue. Regional Lymph Nodes: No lymph nodes submitted or found Pathologic Stage Classification (pTNM, AJCC 8th Edition): ypT3c, ypNX Representative Tumor Block: B5 Comment(s): There is surface involvement of the left ovary and fallopian tube but no primary tumor. Within the right fallopian tube lumen there is detached fragments of tumor but again no precursor lesion is noted in the right fallopian tube. Thus, the tumor is presumed primary peritoneal and staged as such.   03/21/2019 Tumor Marker   Patient's tumor was tested for the following markers: CA-125 Results of the tumor marker test revealed 15.1    Genetic Testing   Negative genetic testing. No pathogenic variants identified on the [facility] CancerNext + RNAinsight panel. VUS in ATM called [id number] identified. The report date is 05/12/2019. TumorNext HRD was originally ordered but there was not enough sample to complete this testing.   The CancerNext+RNAinsight gene panel offered by [employer] sequencing and rearrangement analysis for the following 36 genes: APC*, ATM*, AXIN2, BARD1, BMPR1A, BRCA1*, BRCA2*, BRIP1*, CDH1*, CDK4, CDKN2A, CHEK2*, DICER1, MLH1*, MSH2*, MSH3, MSH6*, MUTYH*, NBN, NF1*, NTHL1, PALB2*, PMS2*, PTEN*, RAD51C*, RAD51D*, RECQL, SMAD4, SMARCA4, STK11 and TP53* (sequencing and deletion/duplication); HOXB13, POLD1 and POLE (sequencing only); EPCAM and GREM1 (deletion/duplication only). DNA and RNA analyses performed for * genes.    05/16/2019 Tumor Marker   Patient's tumor was tested for the following  markers: CA-125 Results of the tumor marker test revealed 10.7   06/16/2019 Imaging   1. No evidence of residual or recurrent metastatic disease in the chest, abdomen or pelvis. 2. Masslike focus  of consolidation in the right middle lobe along the major and minor fissures with associated volume loss is stable since 01/31/2019. Indolent primary pulmonary neoplasm not excluded. PET-CT may be considered for further characterization. 3. Aortic Atherosclerosis (ICD10-I70.0).   06/16/2019 Tumor Marker   Patient's tumor was tested for the following markers: CA-125 Results of the tumor marker test revealed 9.1.   12/05/2019 Tumor Marker   Patient's tumor was tested for the following markers: CA-125. Results of the tumor marker test revealed 9.0.   02/29/2020 Tumor Marker   Patient's tumor was tested for the following markers: CA-`125 Results of the tumor marker test revealed 9.7.   05/28/2020 Tumor Marker   Patient's tumor was tested for the following markers: CA-125 Results of the tumor marker test revealed 9.5   08/16/2020 Tumor Marker   Patient's tumor was tested for the following markers: CA-125 Results of the tumor marker test revealed 10.9   11/29/2020 Tumor Marker   Patient's tumor was tested for the following markers: CA-125. Results of the tumor marker test revealed 10.2.   01/22/2021 Pathology Results   SURGICAL PATHOLOGY  CASE: [id number]  PATIENT: Sampson Goon  Surgical Pathology Report   Clinical History: brain metastasis (cm)    FINAL MICROSCOPIC DIAGNOSIS:   A. 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    03/20/2021 Imaging   1. Unchanged enlarged midline superior mediastinal and left retroperitoneal lymph nodes, which remain suspicious for metastatic disease. 2. Unchanged masslike consolidation and volume loss of the right middle lobe measuring 3.8 x 1.9 cm, likely chronic scarring. Continued attention on follow-up. 3. No new evidence of metastatic disease in the chest, abdomen, or pelvis. 4. Status post hysterectomy and  omentectomy.   Aortic Atherosclerosis (ICD10-I70.0).     03/21/2021 -  Chemotherapy   Patient is on Treatment Plan : OVARIAN RECURRENT 3RD LINE Carboplatin D1 / Gemcitabine D1,8 (4/800) q21d       PHYSICAL EXAMINATION: ECOG PERFORMANCE STATUS: 2 - Symptomatic, <50% confined to bed  Vitals:   04/30/21 0845  BP: (!) 122/49  Pulse: 65  Resp: 18  Temp: 98 F (36.7 C)  SpO2: 100%   Filed Weights   04/30/21 0845  Weight: 105 lb 3.2 oz (47.7 kg)   Her examination is normal prior to allergic reaction GENERAL:alert, no distress and comfortable SKIN: skin color, texture, turgor are normal, no rashes or significant lesions EYES: normal, Conjunctiva are pink and non-injected, sclera clear OROPHARYNX:no exudate, no erythema and lips, buccal mucosa, and tongue normal  NECK: supple, thyroid normal size, non-tender, without nodularity LYMPH:  no palpable lymphadenopathy in the cervical, axillary or inguinal LUNGS: clear to auscultation and percussion with normal breathing effort HEART: regular rate & rhythm and no murmurs and no lower extremity edema ABDOMEN:abdomen soft, non-tender and normal bowel sounds Musculoskeletal:no cyanosis of digits and no clubbing  NEURO: alert & oriented x 3 with fluent speech, no focal motor/sensory deficits  LABORATORY DATA:  I have reviewed the data as listed    Component Value Date/Time   NA 142 04/30/2021 0828   K 3.3 (L) 04/30/2021 0828   CL 105 04/30/2021 0828   CO2 30 04/30/2021  0828   GLUCOSE 94 04/30/2021 0828   BUN 11 04/30/2021 0828   CREATININE 0.49 04/30/2021 0828   CALCIUM 9.3 04/30/2021 0828   PROT 6.6 04/30/2021 0828   ALBUMIN 4.1 04/30/2021 0828   AST 19 04/30/2021 0828   ALT 18 04/30/2021 0828   ALKPHOS 44 04/30/2021 0828   BILITOT 0.3 04/30/2021 0828   GFRNONAA >60 04/30/2021 0828   GFRAA >60 12/05/2019 1145    No results found for: SPEP, UPEP  Lab Results  Component Value Date   WBC 6.2 04/30/2021   NEUTROABS 3.0  04/30/2021   HGB 10.4 (L) 04/30/2021   HCT 31.4 (L) 04/30/2021   MCV 94.0 04/30/2021   PLT 270 04/30/2021      Chemistry      Component Value Date/Time   NA 142 04/30/2021 0828   K 3.3 (L) 04/30/2021 0828   CL 105 04/30/2021 0828   CO2 30 04/30/2021 0828   BUN 11 04/30/2021 0828   CREATININE 0.49 04/30/2021 0828      Component Value Date/Time   CALCIUM 9.3 04/30/2021 0828   ALKPHOS 44 04/30/2021 0828   AST 19 04/30/2021 0828   ALT 18 04/30/2021 0828   BILITOT 0.3 04/30/2021 3200

## 2021-04-30 NOTE — Patient Instructions (Signed)
[facility]COL[facility]  Discharge Instructions: Thank you for choosing [facility] to provide your oncology and hematology care.   If you have a lab appointment with the [facility], please go directly to the [facility] and check in at the registration area.   Wear comfortable clothing and clothing appropriate for easy access to any Portacath or PICC line.   We strive to give you quality time with your provider. You may need to reschedule your appointment if you arrive late (15 or more minutes).  Arriving late affects you and other patients whose appointments are after yours.  Also, if you miss three or more appointments without notifying the office, you may be dismissed from the clinic at the providers discretion.      For prescription refill requests, have your pharmacy contact our office and allow 72 hours for refills to be completed.    Today you received the following chemotherapy and/or immunotherapy agents: Gemzar/Carboplatin.      To help prevent nausea and vomiting after your treatment, we encourage you to take your nausea medication as directed.  BELOW ARE SYMPTOMS THAT SHOULD BE REPORTED IMMEDIATELY: *FEVER GREATER THAN 100.4 F (38 C) OR HIGHER *CHILLS OR SWEATING *NAUSEA AND VOMITING THAT IS NOT CONTROLLED WITH YOUR NAUSEA MEDICATION *UNUSUAL SHORTNESS OF BREATH *UNUSUAL BRUISING OR BLEEDING *URINARY PROBLEMS (pain or burning when urinating, or frequent urination) *BOWEL PROBLEMS (unusual diarrhea, constipation, pain near the anus) TENDERNESS IN MOUTH AND THROAT WITH OR WITHOUT PRESENCE OF ULCERS (sore throat, sores in mouth, or a toothache) UNUSUAL RASH, SWELLING OR PAIN  UNUSUAL VAGINAL DISCHARGE OR ITCHING   Items with * indicate a potential emergency and should be followed up as soon as possible or go to the Emergency Department if any problems should occur.  Please show the CHEMOTHERAPY ALERT CARD or IMMUNOTHERAPY ALERT CARD at  check-in to the Emergency Department and triage nurse.  Should you have questions after your visit or need to cancel or reschedule your appointment, please contact [facility]  Dept: [phone]  and follow the prompts.  Office hours are 8:00 a.m. to 4:30 p.m. Monday - Friday. Please note that voicemails left after 4:00 p.m. may not be returned until the following business day.  We are closed weekends and major holidays. You have access to a nurse at all times for urgent questions. Please call the main number to the clinic Dept: [phone] and follow the prompts.   For any non-urgent questions, you may also contact your provider using MyChart. We now offer e-Visits for anyone 75 and older to request care online for non-urgent symptoms. For details visit [email or web address].   Also download the MyChart app! Go to the app store, search "MyChart", open the app, select [facility], and log in with your MyChart username and password.  Due to Covid, a mask is required upon entering the hospital/clinic. If you do not have a mask, one will be given to you upon arrival. For doctor visits, patients may have 1 support person aged 63 or older with them. For treatment visits, patients cannot have anyone with them due to current Covid guidelines and our immunocompromised population.

## 2021-04-30 NOTE — Assessment & Plan Note (Signed)
This is likely due to recent treatment. The patient denies recent history of bleeding such as epistaxis, hematuria or hematochezia. She is asymptomatic from the anemia. I will observe for now.   

## 2021-04-30 NOTE — Assessment & Plan Note (Signed)
We spent significant amount of time today managing carboplatin infusion reaction The patient was subsequently discharged without significant residual side effects from the reaction She will proceed with CT imaging next month Depending on results of the CT imaging, she would either switch to maintenance bevacizumab, continuing on gemcitabine, add bevacizumab to gemcitabine or switch to PARP inhibitor I will see her again next week for further follow-up

## 2021-04-30 NOTE — Progress Notes (Signed)
Brief nutrition follow up completed with patient during infusion for peritoneal cancer. ° °Weight documented as 105.2 pounds on Feb 14. This is increased from 99.6 pounds on January 12. Patient is unhappy with weight gain and states she doesn't want to weigh in the 100's. States she feels better when she weighs less. Reports some bloating today which may be related to constipation. °She has been hungrier than usual and reports this may be due to steroids. ° °She has met with the Chaplain and found that helpful. ° °Nutrition diagnosis: Severe malnutrition ongoing. ° °Intervention: °Continue adequate calories in small, frequent intake of healthy diet including high protein foods and fruits and vegetables. °Continue adequate fluid intake. ° °Monitoring, Evaluation, Goals: °Minimize weight loss through out treatment by consuming adequate calories, protein and fluids. ° °Will follow up as needed. ° °**Disclaimer: This note was dictated with voice recognition software. Similar sounding words can inadvertently be transcribed and this note may contain transcription errors which may not have been corrected upon publication of note.** ° °

## 2021-05-01 ENCOUNTER — Telehealth: Payer: Self-pay | Admitting: Hematology and Oncology

## 2021-05-01 NOTE — Telephone Encounter (Signed)
I called the patient and spoke with her this morning. Overall, she has persistent sensation of shakiness.  The sensation of lump in the throat is almost completely gone.  She felt generalized weakness and fatigue.  The rash on her arm is almost completely resolved.  She took 2 mg of dexamethasone this morning and my recommendation would be for her to go back to 1 mg starting tomorrow.  The patient knows how to reach [name] if questions arise.

## 2021-05-02 ENCOUNTER — Telehealth: Payer: Self-pay

## 2021-05-02 NOTE — Telephone Encounter (Signed)
1) I suggest reach out to Dr. Mickeal Skinner about the twitching, this is not a usual chemo reaction 2) Generalized shakiness is due to steroids, likely residual effects, typically high dose steroids effect last 2-3 days

## 2021-05-02 NOTE — Telephone Encounter (Signed)
Returned her call. She want to give update. Late yesterday she started having non visible twitching and pulling to the left side of her face. With a medium smile she has visible twitching/pulling to her left side of her face, with a little on the right side. This resolves after she stops smiling. BP and temperature good. She has generalized shakiness and shakiness to hands/ arms and thinks this is from the steroids. She is currently taking 1 mg Dexamethasone. She is asking if you need to see her? Should Dr. Mickeal Skinner be made aware?

## 2021-05-02 NOTE — Telephone Encounter (Signed)
Called her back per Dr. Alvy Bimler, she will see her on Tuesday 2/28 and talk to her about stopping the decadron. Told her Dr. Mickeal Skinner is aware of twitching.  She verbalized understanding and will call the office back for questions or concerns.

## 2021-05-02 NOTE — Telephone Encounter (Signed)
Called and given below message. She verbalized understanding. She will call the office back for changes. Carboplatin reaction on 2/14. Told her I would forward her concerns to Dr. Mickeal Skinner and his nurse.

## 2021-05-06 ENCOUNTER — Other Ambulatory Visit: Payer: Self-pay

## 2021-05-07 ENCOUNTER — Inpatient Hospital Stay (HOSPITAL_BASED_OUTPATIENT_CLINIC_OR_DEPARTMENT_OTHER): Payer: Medicare Other | Admitting: Hematology and Oncology

## 2021-05-07 ENCOUNTER — Encounter: Payer: Self-pay | Admitting: Hematology and Oncology

## 2021-05-07 ENCOUNTER — Other Ambulatory Visit (HOSPITAL_COMMUNITY): Payer: Self-pay

## 2021-05-07 ENCOUNTER — Other Ambulatory Visit: Payer: Self-pay

## 2021-05-07 ENCOUNTER — Inpatient Hospital Stay: Payer: Medicare Other

## 2021-05-07 DIAGNOSIS — I952 Hypotension due to drugs: Secondary | ICD-10-CM | POA: Diagnosis not present

## 2021-05-07 DIAGNOSIS — T451X5A Adverse effect of antineoplastic and immunosuppressive drugs, initial encounter: Secondary | ICD-10-CM | POA: Diagnosis not present

## 2021-05-07 DIAGNOSIS — R253 Fasciculation: Secondary | ICD-10-CM | POA: Diagnosis not present

## 2021-05-07 DIAGNOSIS — H538 Other visual disturbances: Secondary | ICD-10-CM | POA: Diagnosis not present

## 2021-05-07 DIAGNOSIS — G514 Facial myokymia: Secondary | ICD-10-CM | POA: Diagnosis not present

## 2021-05-07 DIAGNOSIS — C482 Malignant neoplasm of peritoneum, unspecified: Secondary | ICD-10-CM

## 2021-05-07 DIAGNOSIS — D6481 Anemia due to antineoplastic chemotherapy: Secondary | ICD-10-CM

## 2021-05-07 DIAGNOSIS — Z5111 Encounter for antineoplastic chemotherapy: Secondary | ICD-10-CM | POA: Diagnosis not present

## 2021-05-07 LAB — CMP (CANCER CENTER ONLY)
ALT: 20 U/L (ref 0–44)
AST: 18 U/L (ref 15–41)
Albumin: 4.1 g/dL (ref 3.5–5.0)
Alkaline Phosphatase: 43 U/L (ref 38–126)
Anion gap: 6 (ref 5–15)
BUN: 13 mg/dL (ref 8–23)
CO2: 30 mmol/L (ref 22–32)
Calcium: 9 mg/dL (ref 8.9–10.3)
Chloride: 104 mmol/L (ref 98–111)
Creatinine: 0.52 mg/dL (ref 0.44–1.00)
GFR, Estimated: >60 mL/min
Glucose, Bld: 94 mg/dL (ref 70–99)
Potassium: 3.6 mmol/L (ref 3.5–5.1)
Sodium: 140 mmol/L (ref 135–145)
Total Bilirubin: 0.3 mg/dL (ref 0.3–1.2)
Total Protein: 6.6 g/dL (ref 6.5–8.1)

## 2021-05-07 LAB — CBC WITH DIFFERENTIAL (CANCER CENTER ONLY)
Abs Immature Granulocytes: 0.05 K/uL (ref 0.00–0.07)
Basophils Absolute: 0 K/uL (ref 0.0–0.1)
Basophils Relative: 0 %
Eosinophils Absolute: 0 K/uL (ref 0.0–0.5)
Eosinophils Relative: 1 %
HCT: 31.1 % — ABNORMAL LOW (ref 36.0–46.0)
Hemoglobin: 10.3 g/dL — ABNORMAL LOW (ref 12.0–15.0)
Immature Granulocytes: 1 %
Lymphocytes Relative: 21 %
Lymphs Abs: 1.5 K/uL (ref 0.7–4.0)
MCH: 30.9 pg (ref 26.0–34.0)
MCHC: 33.1 g/dL (ref 30.0–36.0)
MCV: 93.4 fL (ref 80.0–100.0)
Monocytes Absolute: 0.4 K/uL (ref 0.1–1.0)
Monocytes Relative: 5 %
Neutro Abs: 4.9 K/uL (ref 1.7–7.7)
Neutrophils Relative %: 72 %
Platelet Count: 225 K/uL (ref 150–400)
RBC: 3.33 MIL/uL — ABNORMAL LOW (ref 3.87–5.11)
RDW: 16.8 % — ABNORMAL HIGH (ref 11.5–15.5)
WBC Count: 6.9 K/uL (ref 4.0–10.5)
nRBC: 0 % (ref 0.0–0.2)

## 2021-05-07 MED ORDER — DEXAMETHASONE 0.5 MG PO TABS
0.5000 mg | ORAL_TABLET | Freq: Every day | ORAL | 0 refills | Status: DC
  Filled 2021-05-07: qty 30, 30d supply, fill #0

## 2021-05-07 MED ORDER — LIDOCAINE-PRILOCAINE 2.5-2.5 % EX CREA
1.0000 "application " | TOPICAL_CREAM | CUTANEOUS | 3 refills | Status: DC | PRN
  Filled 2021-05-07: qty 30, 30d supply, fill #0

## 2021-05-07 MED ORDER — SODIUM CHLORIDE 0.9% FLUSH
10.0000 mL | INTRAVENOUS | Status: DC | PRN
  Administered 2021-05-07: 10 mL

## 2021-05-07 MED ORDER — SODIUM CHLORIDE 0.9 % IV SOLN
800.0000 mg/m2 | Freq: Once | INTRAVENOUS | Status: AC
  Administered 2021-05-07: 1178 mg via INTRAVENOUS
  Filled 2021-05-07: qty 30.98

## 2021-05-07 MED ORDER — HEPARIN SOD (PORK) LOCK FLUSH 100 UNIT/ML IV SOLN
500.0000 [IU] | Freq: Once | INTRAVENOUS | Status: AC | PRN
  Administered 2021-05-07: 500 [IU]

## 2021-05-07 MED ORDER — PROCHLORPERAZINE MALEATE 10 MG PO TABS
10.0000 mg | ORAL_TABLET | Freq: Once | ORAL | Status: AC
  Administered 2021-05-07: 10 mg via ORAL
  Filled 2021-05-07: qty 1

## 2021-05-07 MED ORDER — SODIUM CHLORIDE 0.9 % IV SOLN: Freq: Once | INTRAVENOUS | Status: AC

## 2021-05-07 NOTE — Progress Notes (Signed)
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 from recent treatment She has some facial twitching, shakiness, worsening fatigue, worsening blurriness of vision and generalized jitteriness I have reviewed that some of the side effects could be due to ongoing use of dexamethasone After discussion with neuro oncologist, we are in agreement to try to reduce the dose of dexamethasone I have sent prescription for 0.5 mg of dexamethasone with plan to taper her off slowly over the next few weeks She is scheduled for CT imaging soon We discussed the risk and benefits of gemcitabine/bevacizumab, bevacizumab only or continuing with single agent gemcitabine The patient is interested to go on slightly more aggressive route I will get bevacizumab added and prior authorized for her next treatment   Anemia due to antineoplastic chemotherapy This is likely due to recent treatment. The patient denies recent history of bleeding such as epistaxis, hematuria or hematochezia. She is asymptomatic from the anemia. I will observe for now.    Facial twitching She have slightly more facial twitching, unknown cause MRI of the head has been scheduled It does not seem that this is ongoing seizure Observe closely for now  Blurred vision She have slight worsening blurriness of vision could be precipitated by ongoing dexamethasone As above, we will start gentle taper  No orders of the defined types were placed in this encounter.   All questions were answered. The patient knows to call the clinic with any problems, questions or concerns. The total time spent in the appointment was 30 minutes encounter with patients including review of chart  and various tests results, discussions about plan of care and coordination of care plan   [name], MD 05/07/2021 10:23 AM  INTERVAL HISTORY: Please see below for problem oriented charting. she returns for treatment follow-up  She is still having some residual side effects from recent treatment Her rash is gone She has some facial twitchiness which is slightly worse, increased shakiness, feeling jittery, worsening fatigue and worsening blurriness of vision She also felt somewhat bloated but have normal bowel habits  REVIEW OF SYSTEMS:   Constitutional: Denies fevers, chills or abnormal weight loss Ears, nose, mouth, throat, and face: Denies mucositis or sore throat Respiratory: Denies cough, dyspnea or wheezes Cardiovascular: Denies palpitation, chest discomfort or lower extremity swelling Gastrointestinal:  Denies nausea, heartburn or change in bowel habits Skin: Denies abnormal skin rashes Lymphatics: Denies new lymphadenopathy or easy bruising Behavioral/Psych: Mood is stable, no new changes  All other systems were reviewed with the patient and are negative.  I have reviewed the past medical history, past surgical history, social history and family history with the patient and they are unchanged from previous note.  ALLERGIES:  is allergic to carboplatin, fentanyl, vancomycin, lac bovis, brimonidine tartrate, ditropan [oxybutynin], milk-related compounds, other, oxybutynin chloride, thimerosal, tramadol, avelox [moxifloxacin hcl in nacl], doxycycline, erythromycin, metronidazole, minocycline, penicillins, quinolones, and sulfonamide derivatives.  MEDICATIONS:  Current Outpatient Medications  Medication Sig Dispense Refill   dexamethasone (DECADRON) 0.5 MG tablet Take 1 tablet (0.5 mg total) by mouth daily. 30 tablet 0   lidocaine-prilocaine (EMLA) cream Apply 1 application topically as needed. 30 g 3   Azelaic Acid 15 % cream Apply 1 application topically daily.  cholecalciferol (VITAMIN D3) 25 MCG (1000 UNIT) tablet Take 2,000 Units by mouth daily.     gabapentin (NEURONTIN) 300 MG capsule Take 300-600 mg by mouth See admin instructions. Taking 600 mg in am ;300 mg at lunch; 300 mg at hs.     levETIRAcetam (KEPPRA) 500 MG tablet TAKE 2 TABLETS BY MOUTH TWICE DAILY 120 tablet 1   Magnesium 250 MG TABS Take 250 mg by mouth daily.     ondansetron (ZOFRAN) 8 MG tablet Take 1 tablet (8 mg total) by mouth every 8 (eight) hours as needed. 30 tablet 1   Probiotic Product (PROBIOTIC DAILY PO) Take 1 capsule by mouth daily.     prochlorperazine (COMPAZINE) 10 MG tablet Take 1 tablet (10 mg total) by mouth every 6 (six) hours as needed (Nausea or vomiting). 30 tablet 1   Sulfacetamide Sodium, Acne, 10 % LOTN Apply 1 application topically at bedtime.     VITAMIN E PO Take 50 Int'l Units/day by mouth daily.     No current facility-administered medications for this visit.    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 Findings highly suspicious for peritoneal carcinomatosis. Recommend paracentesis for therapeutic and diagnostic purposes. I do not see an obvious primary lesion but there is some irregular enhancing soft tissue in the right adnexal area. CA 125 level may be helpful. 2. Mild surface irregularity involving the liver but I do not see any obvious changes of cirrhosis. No hepatic lesions.   11/23/2018 Tumor Marker   Patient's tumor was tested for the following markers: CA-125 Results of the tumor marker test revealed 253.   11/25/2018 Initial Diagnosis   Peritoneal carcinoma ([place])   11/25/2018 Imaging   US paracentesis Successful ultrasound-guided paracentesis yielding 3.4 L of peritoneal fluid.   12/01/2018 Cancer Staging   Staging form: Ovary,  Fallopian Tube, and Primary Peritoneal Carcinoma, AJCC 8th Edition - Clinical stage from 12/01/2018: Stage IVA (rcT3, cN0, pM1a) - Signed by [name], MD on 02/14/2021 Stage prefix: Recurrence    12/02/2018 Tumor Marker   Patient's tumor was tested for the following markers: CA-125 Results of the tumor marker test revealed 267   12/06/2018 - 05/16/2019 Chemotherapy   The patient had carboplatin and taxol for chemotherapy treatment.     12/08/2018 Procedure   Successful ultrasound-guided therapeutic paracentesis yielding 1.7 liters of peritoneal fluid.   12/24/2018 Procedure   Successful placement of a right IJ approach Power Port with ultrasound and fluoroscopic guidance. The catheter is ready for use   01/17/2019 Tumor Marker   Patient's tumor was tested for the following markers: CA-125 Results of the tumor marker test revealed 57.2   01/31/2019 Imaging   Significant decrease in peritoneal carcinomatosis since previous study. Interval resolution of ascites.   Focal area of parenchymal consolidation in central right middle lobe, which measures 3 cm. Differential diagnosis includes infectious or inflammatory process, atelectasis, and neoplasm. Recommend short-term follow-up by chest CT in 2-3 months.     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   Surgeon: Donaciano Eva    Operative Findings:  : grossly normal uterus, ovaries  normal, few scattered peritoneal nodules (59m) on serosa of uterus and tubes. The omentum (gastrocolic) was tethered to the mesentery with tumor (thin rind). Complete (optimal) resection of tumor with no gross residual disease.     02/17/2019 Pathology Results   FINAL MICROSCOPIC DIAGNOSIS: A. UTERUS, CERVIX,  BILATERAL FALLOPIAN TUBES AND OVARIES, HYSTERECTOMY WITH SALPINGOOOPHORECTOMY: - Uterus: Endometrium: Inactive endometrium. No hyperplasia or malignancy. Myometrium: Unremarkable. No malignancy. Serosa: Metastatic carcinoma. No malignancy. - Cervix: Benign squamous and endocervical mucosa. No dysplasia or malignancy. - Left ovary and fallopian tube: Metastatic carcinoma. - Right ovary: No malignancy identified. - Right fallopian tube: Luminal tumor, see comment. B. OMENTUM, RESECTION: - High grade serous carcinoma. - Deposits up to at least 3 cm. - See oncology table. ONCOLOGY TABLE: OVARY or FALLOPIAN TUBE or PRIMARY PERITONEUM: Procedure: Hysterectomy with bilateral salpingo-oophorectomy and omentectomy. Specimen Integrity: Intact Tumor Site: Peritoneum Ovarian Surface Involvement (required only if applicable): Left ovary Fallopian Tube Surface Involvement (required only if applicable): Left Fallopian tube Tumor Size: Largest deposit 3 cm Histologic Type: High-grade serous carcinoma Histologic Grade: High-grade Implants (required for advanced stage serous/seromucinous borderline tumors only): Uterine serosa, left fallopian tube and ovary, omentum Other Tissue/ Organ Involvement: As above Largest Extrapelvic Peritoneal Focus (required only if applicable): 3 cm Peritoneal/Ascitic Fluid: Pre neoadjuvant [id number] positive for carcinoma. Treatment Effect (required only for high-grade serous carcinomas): Probably partial treatment effect in omental tissue. Regional Lymph Nodes: No lymph nodes submitted or found Pathologic Stage Classification (pTNM, AJCC 8th Edition): ypT3c, ypNX Representative Tumor Block: B5 Comment(s): There is surface involvement of the left ovary and fallopian tube but no primary tumor. Within the right fallopian tube lumen there is detached fragments of tumor but again no precursor lesion is noted in the right fallopian tube. Thus, the tumor is presumed primary  peritoneal and staged as such.   03/21/2019 Tumor Marker   Patient's tumor was tested for the following markers: CA-125 Results of the tumor marker test revealed 15.1    Genetic Testing   Negative genetic testing. No pathogenic variants identified on the [facility] CancerNext + RNAinsight panel. VUS in ATM called [id number] identified. The report date is 05/12/2019. TumorNext HRD was originally ordered but there was not enough sample to complete this testing.   The CancerNext+RNAinsight gene panel offered by [employer] [facility] sequencing and rearrangement analysis for the following 36 genes: APC*, ATM*, AXIN2, BARD1, BMPR1A, BRCA1*, BRCA2*, BRIP1*, CDH1*, CDK4, CDKN2A, CHEK2*, DICER1, MLH1*, MSH2*, MSH3, MSH6*, MUTYH*, NBN, NF1*, NTHL1, PALB2*, PMS2*, PTEN*, RAD51C*, RAD51D*, RECQL, SMAD4, SMARCA4, STK11 and TP53* (sequencing and deletion/duplication); HOXB13, POLD1 and POLE (sequencing only); EPCAM and GREM1 (deletion/duplication only). DNA and RNA analyses performed for * genes.    05/16/2019 Tumor Marker   Patient's tumor was tested for the following markers: CA-125 Results of the tumor marker test revealed 10.7   06/16/2019 Imaging   1. No evidence of residual or recurrent metastatic disease in the chest, abdomen or pelvis. 2. Masslike focus of consolidation in the right middle lobe along the major and minor fissures with associated volume loss is stable since 01/31/2019. Indolent primary pulmonary neoplasm not excluded. PET-CT may be considered for further characterization. 3. Aortic Atherosclerosis (ICD10-I70.0).   06/16/2019 Tumor Marker   Patient's tumor was tested for the following markers: CA-125 Results of the tumor marker test revealed 9.1.   12/05/2019 Tumor Marker   Patient's tumor was tested for the following markers: CA-125. Results of the tumor marker test revealed 9.0.   02/29/2020 Tumor Marker  Patient's tumor was tested for the following markers: CA-`125 Results of the tumor  marker test revealed 9.7.   05/28/2020 Tumor Marker   Patient's tumor was tested for the following markers: CA-125 Results of the tumor marker test revealed 9.5   08/16/2020 Tumor Marker   Patient's tumor was tested for the following markers: CA-125 Results of the tumor marker test revealed 10.9   11/29/2020 Tumor Marker   Patient's tumor was tested for the following markers: CA-125. Results of the tumor marker test revealed 10.2.   01/22/2021 Pathology Results   SURGICAL PATHOLOGY  CASE: [phone]  PATIENT: Sampson Goon  Surgical Pathology Report   Clinical History: brain metastasis (cm)    FINAL MICROSCOPIC DIAGNOSIS:   A. 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    03/20/2021 Imaging   1. Unchanged enlarged midline superior mediastinal and left retroperitoneal lymph nodes, which remain suspicious for metastatic disease. 2. Unchanged masslike consolidation and volume loss of the right middle lobe measuring 3.8 x 1.9 cm, likely chronic scarring. Continued attention on follow-up. 3. No new evidence of metastatic disease in the chest, abdomen, or pelvis. 4. Status post hysterectomy and omentectomy.   Aortic Atherosclerosis (ICD10-I70.0).     03/21/2021 -  Chemotherapy   Patient is on Treatment Plan : OVARIAN RECURRENT 3RD LINE Carboplatin D1 / Gemcitabine D1,8 (4/800) q21d       PHYSICAL EXAMINATION: ECOG PERFORMANCE STATUS: 1 - Symptomatic but completely ambulatory  Vitals:   05/07/21 0922  BP: (!) 120/48  Pulse: 75  Resp: 18  Temp: 97.6 F (36.4 C)  SpO2: 99%   Filed Weights   05/07/21 0922  Weight: 103 lb 11.2 oz (47 kg)    GENERAL:alert, no distress and comfortable SKIN: skin color, texture, turgor are normal, no rashes or significant lesions EYES: normal, Conjunctiva are pink and non-injected, sclera  clear OROPHARYNX:no exudate, no erythema and lips, buccal mucosa, and tongue normal  NECK: supple, thyroid normal size, non-tender, without nodularity LYMPH:  no palpable lymphadenopathy in the cervical, axillary or inguinal LUNGS: clear to auscultation and percussion with normal breathing effort HEART: regular rate & rhythm and no murmurs and no lower extremity edema ABDOMEN:abdomen soft, non-tender and normal bowel sounds Musculoskeletal:no cyanosis of digits and no clubbing  NEURO: alert & oriented x 3 with fluent speech, no focal motor/sensory deficits  LABORATORY DATA:  I have reviewed the data as listed    Component Value Date/Time   NA 140 05/07/2021 0837   K 3.6 05/07/2021 0837   CL 104 05/07/2021 0837   CO2 30 05/07/2021 0837   GLUCOSE 94 05/07/2021 0837   BUN 13 05/07/2021 0837   CREATININE 0.52 05/07/2021 0837   CALCIUM 9.0 05/07/2021 0837   PROT 6.6 05/07/2021 0837   ALBUMIN 4.1 05/07/2021 0837   AST 18 05/07/2021 0837   ALT 20 05/07/2021 0837   ALKPHOS 43 05/07/2021 0837   BILITOT 0.3 05/07/2021 0837   GFRNONAA >60 05/07/2021 0837   GFRAA >60 12/05/2019 1145    No results found for: SPEP, UPEP  Lab Results  Component Value Date   WBC 6.9 05/07/2021   NEUTROABS 4.9 05/07/2021   HGB 10.3 (L) 05/07/2021   HCT 31.1 (L) 05/07/2021   MCV 93.4 05/07/2021   PLT 225 05/07/2021      Chemistry      Component Value Date/Time  NA 140 05/07/2021 0837   K 3.6 05/07/2021 0837   CL 104 05/07/2021 0837   CO2 30 05/07/2021 0837   BUN 13 05/07/2021 0837   CREATININE 0.52 05/07/2021 0837      Component Value Date/Time   CALCIUM 9.0 05/07/2021 0837   ALKPHOS 43 05/07/2021 0837   AST 18 05/07/2021 0837   ALT 20 05/07/2021 0837   BILITOT 0.3 05/07/2021 9021

## 2021-05-07 NOTE — Assessment & Plan Note (Signed)
She have slight worsening blurriness of vision could be precipitated by ongoing dexamethasone As above, we will start gentle taper

## 2021-05-07 NOTE — Assessment & Plan Note (Signed)
She have slightly more facial twitching, unknown cause MRI of the head has been scheduled It does not seem that this is ongoing seizure Observe closely for now

## 2021-05-07 NOTE — Assessment & Plan Note (Signed)
She has significant residual side effects from recent treatment She has some facial twitching, shakiness, worsening fatigue, worsening blurriness of vision and generalized jitteriness I have reviewed that some of the side effects could be due to ongoing use of dexamethasone After discussion with neuro oncologist, we are in agreement to try to reduce the dose of dexamethasone I have sent prescription for 0.5 mg of dexamethasone with plan to taper her off slowly over the next few weeks She is scheduled for CT imaging soon We discussed the risk and benefits of gemcitabine/bevacizumab, bevacizumab only or continuing with single agent gemcitabine The patient is interested to go on slightly more aggressive route I will get bevacizumab added and prior authorized for her next treatment

## 2021-05-07 NOTE — Patient Instructions (Signed)
[facility] MEDICAL ONCOLOGY   ?Discharge Instructions: ?Thank you for choosing [facility] to provide your oncology and hematology care.  ? ?If you have a lab appointment with the Cancer Center, please go directly to the Cancer Center and check in at the registration area. ?  ?Wear comfortable clothing and clothing appropriate for easy access to any Portacath or PICC line.  ? ?We strive to give you quality time with your provider. You may need to reschedule your appointment if you arrive late (15 or more minutes).  Arriving late affects you and other patients whose appointments are after yours.  Also, if you miss three or more appointments without notifying the office, you may be dismissed from the clinic at the provider?s discretion.    ?  ?For prescription refill requests, have your pharmacy contact our office and allow 72 hours for refills to be completed.   ? ?Today you received the following chemotherapy and/or immunotherapy agents: gemcitabine    ?  ?To help prevent nausea and vomiting after your treatment, we encourage you to take your nausea medication as directed. ? ?BELOW ARE SYMPTOMS THAT SHOULD BE REPORTED IMMEDIATELY: ?*FEVER GREATER THAN 100.4 F (38 ?C) OR HIGHER ?*CHILLS OR SWEATING ?*NAUSEA AND VOMITING THAT IS NOT CONTROLLED WITH YOUR NAUSEA MEDICATION ?*UNUSUAL SHORTNESS OF BREATH ?*UNUSUAL BRUISING OR BLEEDING ?*URINARY PROBLEMS (pain or burning when urinating, or frequent urination) ?*BOWEL PROBLEMS (unusual diarrhea, constipation, pain near the anus) ?TENDERNESS IN MOUTH AND THROAT WITH OR WITHOUT PRESENCE OF ULCERS (sore throat, sores in mouth, or a toothache) ?UNUSUAL RASH, SWELLING OR PAIN  ?UNUSUAL VAGINAL DISCHARGE OR ITCHING  ? ?Items with * indicate a potential emergency and should be followed up as soon as possible or go to the Emergency Department if any problems should occur. ? ?Please show the CHEMOTHERAPY ALERT CARD or IMMUNOTHERAPY ALERT CARD at check-in  to the Emergency Department and triage nurse. ? ?Should you have questions after your visit or need to cancel or reschedule your appointment, please contact [facility]  Dept: [phone]  and follow the prompts.  Office hours are 8:00 a.m. to 4:30 p.m. Monday - Friday. Please note that voicemails left after 4:00 p.m. may not be returned until the following business day.  We are closed weekends and major holidays. You have access to a nurse at all times for urgent questions. Please call the main number to the clinic Dept: [phone] and follow the prompts. ? ? ?For any non-urgent questions, you may also contact your provider using MyChart. We now offer e-Visits for anyone 18 and older to request care online for non-urgent symptoms. For details visit [email or web address]. ?  ?Also download the MyChart app! Go to the app store, search "MyChart", open the app, select [facility], and log in with your MyChart username and password. ? ?Due to Covid, a mask is required upon entering the hospital/clinic. If you do not have a mask, one will be given to you upon arrival. For doctor visits, patients may have 1 support person aged 18 or older with them. For treatment visits, patients cannot have anyone with them due to current Covid guidelines and our immunocompromised population.  ? ?

## 2021-05-07 NOTE — Assessment & Plan Note (Signed)
This is likely due to recent treatment. The patient denies recent history of bleeding such as epistaxis, hematuria or hematochezia. She is asymptomatic from the anemia. I will observe for now.   

## 2021-05-08 ENCOUNTER — Other Ambulatory Visit (HOSPITAL_COMMUNITY): Payer: Self-pay

## 2021-05-08 ENCOUNTER — Other Ambulatory Visit: Payer: Self-pay | Admitting: Hematology and Oncology

## 2021-05-08 DIAGNOSIS — C482 Malignant neoplasm of peritoneum, unspecified: Secondary | ICD-10-CM

## 2021-05-08 NOTE — Progress Notes (Signed)
START OFF PATHWAY REGIMEN - Ovarian   [id number]:Bevacizumab 15 mg/kg D1 + Carboplatin AUC=4 D1 + Gemcitabine 1,000 mg/m2 D1,8 q21 Days:   A cycle is every 21 days:     Bevacizumab-xxxx      Gemcitabine      Carboplatin   **Always confirm dose/schedule in your pharmacy ordering system**  **Administration Notes: No carboplatin due to allergic reaction  Patient Characteristics: Recurrent or Progressive Disease, Maintenance Therapy BRCA Mutation Status: Absent Therapeutic Status: Recurrent or Progressive Disease Line of Therapy: Maintenance Therapy  Intent of Therapy: Non-Curative / Palliative Intent, Discussed with Patient

## 2021-05-16 ENCOUNTER — Other Ambulatory Visit: Payer: Self-pay

## 2021-05-16 ENCOUNTER — Encounter: Payer: Self-pay | Admitting: Hematology and Oncology

## 2021-05-16 ENCOUNTER — Ambulatory Visit
Admission: RE | Admit: 2021-05-16 | Discharge: 2021-05-16 | Disposition: A | Discharge status: Home / Self Care | Payer: Medicare Other | Source: Ambulatory Visit | Attending: Internal Medicine | Admitting: Internal Medicine

## 2021-05-16 DIAGNOSIS — C569 Malignant neoplasm of unspecified ovary: Secondary | ICD-10-CM | POA: Diagnosis not present

## 2021-05-16 DIAGNOSIS — C7931 Secondary malignant neoplasm of brain: Secondary | ICD-10-CM

## 2021-05-16 MED ORDER — GADOBENATE DIMEGLUMINE 529 MG/ML IV SOLN
9.0000 mL | Freq: Once | INTRAVENOUS | Status: AC | PRN
  Administered 2021-05-16: 9 mL via INTRAVENOUS

## 2021-05-16 MED ORDER — GADOBENATE DIMEGLUMINE 529 MG/ML IV SOLN
18.0000 mL | Freq: Once | INTRAVENOUS | Status: DC | PRN
Start: 2021-05-16 — End: 2021-05-16

## 2021-05-17 ENCOUNTER — Ambulatory Visit (HOSPITAL_COMMUNITY)
Admission: RE | Admit: 2021-05-17 | Discharge: 2021-05-17 | Disposition: A | Discharge status: Home / Self Care | Payer: Medicare Other | Source: Ambulatory Visit | Attending: Hematology and Oncology | Admitting: Hematology and Oncology

## 2021-05-17 ENCOUNTER — Inpatient Hospital Stay: Payer: Medicare Other | Attending: Gynecologic Oncology

## 2021-05-17 ENCOUNTER — Other Ambulatory Visit (HOSPITAL_COMMUNITY): Payer: Self-pay

## 2021-05-17 ENCOUNTER — Telehealth: Payer: Self-pay

## 2021-05-17 DIAGNOSIS — D6481 Anemia due to antineoplastic chemotherapy: Secondary | ICD-10-CM | POA: Insufficient documentation

## 2021-05-17 DIAGNOSIS — T451X5D Adverse effect of antineoplastic and immunosuppressive drugs, subsequent encounter: Secondary | ICD-10-CM | POA: Insufficient documentation

## 2021-05-17 DIAGNOSIS — C482 Malignant neoplasm of peritoneum, unspecified: Secondary | ICD-10-CM | POA: Diagnosis not present

## 2021-05-17 DIAGNOSIS — D61810 Antineoplastic chemotherapy induced pancytopenia: Secondary | ICD-10-CM | POA: Insufficient documentation

## 2021-05-17 DIAGNOSIS — R5383 Other fatigue: Secondary | ICD-10-CM | POA: Insufficient documentation

## 2021-05-17 DIAGNOSIS — Z5111 Encounter for antineoplastic chemotherapy: Secondary | ICD-10-CM | POA: Insufficient documentation

## 2021-05-17 DIAGNOSIS — C7931 Secondary malignant neoplasm of brain: Secondary | ICD-10-CM | POA: Insufficient documentation

## 2021-05-17 LAB — CBC WITH DIFFERENTIAL (CANCER CENTER ONLY)
Abs Immature Granulocytes: 0.01 10*3/uL (ref 0.00–0.07)
Basophils Absolute: 0 10*3/uL (ref 0.0–0.1)
Basophils Relative: 0 %
Eosinophils Absolute: 0.1 10*3/uL (ref 0.0–0.5)
Eosinophils Relative: 2 %
HCT: 31.5 % — ABNORMAL LOW (ref 36.0–46.0)
Hemoglobin: 10.6 g/dL — ABNORMAL LOW (ref 12.0–15.0)
Immature Granulocytes: 0 %
Lymphocytes Relative: 38 %
Lymphs Abs: 2 10*3/uL (ref 0.7–4.0)
MCH: 31.5 pg (ref 26.0–34.0)
MCHC: 33.7 g/dL (ref 30.0–36.0)
MCV: 93.8 fL (ref 80.0–100.0)
Monocytes Absolute: 0.8 10*3/uL (ref 0.1–1.0)
Monocytes Relative: 16 %
Neutro Abs: 2.2 10*3/uL (ref 1.7–7.7)
Neutrophils Relative %: 44 %
Platelet Count: 179 10*3/uL (ref 150–400)
RBC: 3.36 MIL/uL — ABNORMAL LOW (ref 3.87–5.11)
RDW: 17.3 % — ABNORMAL HIGH (ref 11.5–15.5)
WBC Count: 5.1 10*3/uL (ref 4.0–10.5)
nRBC: 0 % (ref 0.0–0.2)

## 2021-05-17 LAB — CMP (CANCER CENTER ONLY)
ALT: 25 U/L (ref 0–44)
AST: 22 U/L (ref 15–41)
Albumin: 4.4 g/dL (ref 3.5–5.0)
Alkaline Phosphatase: 52 U/L (ref 38–126)
Anion gap: 8 (ref 5–15)
BUN: 12 mg/dL (ref 8–23)
CO2: 30 mmol/L (ref 22–32)
Calcium: 9.6 mg/dL (ref 8.9–10.3)
Chloride: 100 mmol/L (ref 98–111)
Creatinine: 0.58 mg/dL (ref 0.44–1.00)
GFR, Estimated: >60 mL/min (ref >60)
Glucose, Bld: 85 mg/dL (ref 70–99)
Potassium: 3.5 mmol/L (ref 3.5–5.1)
Sodium: 138 mmol/L (ref 135–145)
Total Bilirubin: 0.3 mg/dL (ref 0.3–1.2)
Total Protein: 7.2 g/dL (ref 6.5–8.1)

## 2021-05-17 LAB — TOTAL PROTEIN, URINE DIPSTICK: Protein, ur: NEGATIVE mg/dL

## 2021-05-17 MED ORDER — IOHEXOL 300 MG/ML  SOLN
100.0000 mL | Freq: Once | INTRAMUSCULAR | Status: AC | PRN
  Administered 2021-05-17: 100 mL via INTRAVENOUS

## 2021-05-17 MED ORDER — SODIUM CHLORIDE (PF) 0.9 % IJ SOLN
INTRAMUSCULAR | Status: AC
  Filled 2021-05-17: qty 50

## 2021-05-17 MED ORDER — SODIUM CHLORIDE 0.9% FLUSH
10.0000 mL | Freq: Once | INTRAVENOUS | Status: AC
  Administered 2021-05-17: 10 mL

## 2021-05-17 MED ORDER — HEPARIN SOD (PORK) LOCK FLUSH 100 UNIT/ML IV SOLN
INTRAVENOUS | Status: AC
  Filled 2021-05-17: qty 5

## 2021-05-17 MED ORDER — HEPARIN SOD (PORK) LOCK FLUSH 100 UNIT/ML IV SOLN
500.0000 [IU] | Freq: Once | INTRAVENOUS | Status: AC
  Administered 2021-05-17: 500 [IU] via INTRAVENOUS

## 2021-05-17 NOTE — Telephone Encounter (Signed)
Notified Patient of prior authorization approval for Lidocaine-Prilocaine 2.5% Cream. Medication is approved through 05/18/2022. No other needs voiced at this time. ?

## 2021-05-20 ENCOUNTER — Telehealth: Payer: Self-pay

## 2021-05-20 ENCOUNTER — Inpatient Hospital Stay (HOSPITAL_BASED_OUTPATIENT_CLINIC_OR_DEPARTMENT_OTHER): Payer: Medicare Other | Admitting: Hematology and Oncology

## 2021-05-20 ENCOUNTER — Inpatient Hospital Stay (HOSPITAL_BASED_OUTPATIENT_CLINIC_OR_DEPARTMENT_OTHER): Payer: Medicare Other | Admitting: Internal Medicine

## 2021-05-20 ENCOUNTER — Encounter: Payer: Self-pay | Admitting: Hematology and Oncology

## 2021-05-20 ENCOUNTER — Inpatient Hospital Stay: Payer: Medicare Other

## 2021-05-20 ENCOUNTER — Other Ambulatory Visit: Payer: Self-pay

## 2021-05-20 ENCOUNTER — Other Ambulatory Visit: Payer: Medicare Other

## 2021-05-20 DIAGNOSIS — C7931 Secondary malignant neoplasm of brain: Secondary | ICD-10-CM

## 2021-05-20 DIAGNOSIS — T451X5D Adverse effect of antineoplastic and immunosuppressive drugs, subsequent encounter: Secondary | ICD-10-CM | POA: Diagnosis not present

## 2021-05-20 DIAGNOSIS — D61810 Antineoplastic chemotherapy induced pancytopenia: Secondary | ICD-10-CM | POA: Diagnosis not present

## 2021-05-20 DIAGNOSIS — C482 Malignant neoplasm of peritoneum, unspecified: Secondary | ICD-10-CM | POA: Diagnosis not present

## 2021-05-20 DIAGNOSIS — Z5111 Encounter for antineoplastic chemotherapy: Secondary | ICD-10-CM | POA: Diagnosis not present

## 2021-05-20 DIAGNOSIS — R5383 Other fatigue: Secondary | ICD-10-CM | POA: Diagnosis not present

## 2021-05-20 DIAGNOSIS — D6481 Anemia due to antineoplastic chemotherapy: Secondary | ICD-10-CM

## 2021-05-20 MED ORDER — LEVETIRACETAM 500 MG PO TABS: 1000.0000 mg | ORAL_TABLET | Freq: Two times a day (BID) | ORAL | 3 refills | Status: DC

## 2021-05-20 NOTE — Progress Notes (Signed)
[facility] [place], [name] [address] [phone]   Interval Evaluation  Date of Service: 05/20/21 Patient Name: [name] [name] Patient MRN: [id number] Patient DOB: 1950/11/15 Provider: Ventura Sellers, MD  Identifying Statement:  [name] [name] is a 71 y.o. 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 Findings highly suspicious for peritoneal carcinomatosis. Recommend paracentesis for therapeutic and diagnostic purposes. I do not see an obvious primary lesion but there is some irregular enhancing soft tissue in the right adnexal area. CA 125 level may be helpful. 2. Mild surface irregularity involving the liver but I do not see any obvious changes of cirrhosis. No hepatic lesions.   11/23/2018 Tumor Marker   Patient's tumor was tested for the following markers: CA-125 Results of the tumor marker test revealed 253.   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   12/01/2018 Cancer Staging   Staging form: Ovary, Fallopian Tube, and Primary Peritoneal Carcinoma, AJCC 8th Edition - Clinical stage from 12/01/2018: Stage IVA (rcT3, cN0, pM1a) - Signed by [name], MD on 02/14/2021 Stage prefix: Recurrence    12/02/2018 Tumor Marker   Patient's tumor was tested for the following markers: CA-125 Results of the tumor marker test revealed 267   12/06/2018 - 05/16/2019 Chemotherapy   The patient had carboplatin and taxol for chemotherapy treatment.     12/08/2018 Procedure   Successful ultrasound-guided therapeutic paracentesis yielding 1.7 liters of  peritoneal fluid.   12/24/2018 Procedure   Successful placement of a right IJ approach Power Port with ultrasound and fluoroscopic guidance. The catheter is ready for use   01/17/2019 Tumor Marker   Patient's tumor was tested for the following markers: CA-125 Results of the tumor marker test revealed 57.2   01/31/2019 Imaging   Significant decrease in peritoneal carcinomatosis since previous study. Interval resolution of ascites.   Focal area of parenchymal consolidation in central right middle lobe, which measures 3 cm. Differential diagnosis includes infectious or inflammatory process, atelectasis, and neoplasm. Recommend short-term follow-up by chest CT in 2-3 months.     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   Surgeon: Donaciano Eva    Operative Findings:  : grossly normal uterus, ovaries normal, few scattered peritoneal nodules (38m) on serosa of uterus and tubes. The omentum (gastrocolic) was tethered to the mesentery with tumor (thin rind). Complete (optimal) resection of tumor with no gross residual disease.     02/17/2019 Pathology Results   FINAL MICROSCOPIC DIAGNOSIS: A. UTERUS, CERVIX, BILATERAL FALLOPIAN TUBES AND OVARIES, HYSTERECTOMY WITH SALPINGOOOPHORECTOMY: - Uterus: Endometrium: Inactive endometrium. No hyperplasia or malignancy. Myometrium: Unremarkable. No malignancy. Serosa: Metastatic carcinoma. No malignancy. - Cervix: Benign squamous and endocervical mucosa. No dysplasia or malignancy. - Left ovary and fallopian tube: Metastatic carcinoma. - Right ovary: No malignancy identified. - Right fallopian tube: Luminal tumor, see comment. B. OMENTUM, RESECTION: - High grade serous carcinoma. - Deposits up to at  least  3 cm. - See oncology table. ONCOLOGY TABLE: OVARY or FALLOPIAN TUBE or PRIMARY PERITONEUM: Procedure: Hysterectomy with bilateral salpingo-oophorectomy and omentectomy. Specimen Integrity: Intact Tumor Site: Peritoneum Ovarian Surface Involvement (required only if applicable): Left ovary Fallopian Tube Surface Involvement (required only if applicable): Left Fallopian tube Tumor Size: Largest deposit 3 cm Histologic Type: High-grade serous carcinoma Histologic Grade: High-grade Implants (required for advanced stage serous/seromucinous borderline tumors only): Uterine serosa, left fallopian tube and ovary, omentum Other Tissue/ Organ Involvement: As above Largest Extrapelvic Peritoneal Focus (required only if applicable): 3 cm Peritoneal/Ascitic Fluid: Pre neoadjuvant [id number] positive for carcinoma. Treatment Effect (required only for high-grade serous carcinomas): Probably partial treatment effect in omental tissue. Regional Lymph Nodes: No lymph nodes submitted or found Pathologic Stage Classification (pTNM, AJCC 8th Edition): ypT3c, ypNX Representative Tumor Block: B5 Comment(s): There is surface involvement of the left ovary and fallopian tube but no primary tumor. Within the right fallopian tube lumen there is detached fragments of tumor but again no precursor lesion is noted in the right fallopian tube. Thus, the tumor is presumed primary peritoneal and staged as such.   03/21/2019 Tumor Marker   Patient's tumor was tested for the following markers: CA-125 Results of the tumor marker test revealed 15.1    Genetic Testing   Negative genetic testing. No pathogenic variants identified on the [facility] CancerNext + RNAinsight panel. VUS in ATM called c.[id number]>A identified. The report date is 05/12/2019. TumorNext HRD was originally ordered but there was not enough sample to complete this testing.   The CancerNext+RNAinsight gene panel offered by [employer] includes sequencing and  rearrangement analysis for the following 36 genes: APC*, ATM*, AXIN2, BARD1, BMPR1A, BRCA1*, BRCA2*, BRIP1*, CDH1*, CDK4, CDKN2A, CHEK2*, DICER1, MLH1*, MSH2*, MSH3, MSH6*, MUTYH*, NBN, NF1*, NTHL1, PALB2*, PMS2*, PTEN*, RAD51C*, RAD51D*, RECQL, SMAD4, SMARCA4, STK11 and TP53* (sequencing and deletion/duplication); HOXB13, POLD1 and POLE (sequencing only); EPCAM and GREM1 (deletion/duplication only). DNA and RNA analyses performed for * genes.    05/16/2019 Tumor Marker   Patient's tumor was tested for the following markers: CA-125 Results of the tumor marker test revealed 10.7   06/16/2019 Imaging   1. No evidence of residual or recurrent metastatic disease in the chest, abdomen or pelvis. 2. Masslike focus of consolidation in the right middle lobe along the major and minor fissures with associated volume loss is stable since 01/31/2019. Indolent primary pulmonary neoplasm not excluded. PET-CT may be considered for further characterization. 3. Aortic Atherosclerosis (ICD10-I70.0).   06/16/2019 Tumor Marker   Patient's tumor was tested for the following markers: CA-125 Results of the tumor marker test revealed 9.1.   12/05/2019 Tumor Marker   Patient's tumor was tested for the following markers: CA-125. Results of the tumor marker test revealed 9.0.   02/29/2020 Tumor Marker   Patient's tumor was tested for the following markers: CA-`125 Results of the tumor marker test revealed 9.7.   05/28/2020 Tumor Marker   Patient's tumor was tested for the following markers: CA-125 Results of the tumor marker test revealed 9.5   08/16/2020 Tumor Marker   Patient's tumor was tested for the following markers: CA-125 Results of the tumor marker test revealed 10.9   11/29/2020 Tumor Marker   Patient's tumor was tested for the following markers: CA-125. Results of the tumor marker test revealed 10.2.   01/22/2021 Pathology Results   SURGICAL PATHOLOGY  CASE: [phone]  PATIENT: Sampson Goon   Surgical Pathology Report   Clinical History: brain metastasis (cm)    FINAL  MICROSCOPIC DIAGNOSIS:   A. 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    03/20/2021 Imaging   1. Unchanged enlarged midline superior mediastinal and left retroperitoneal lymph nodes, which remain suspicious for metastatic disease. 2. Unchanged masslike consolidation and volume loss of the right middle lobe measuring 3.8 x 1.9 cm, likely chronic scarring. Continued attention on follow-up. 3. No new evidence of metastatic disease in the chest, abdomen, or pelvis. 4. Status post hysterectomy and omentectomy.   Aortic Atherosclerosis (ICD10-I70.0).     03/21/2021 - 05/07/2021 Chemotherapy   Patient is on Treatment Plan : OVARIAN RECURRENT 3RD LINE Carboplatin D1 / Gemcitabine D1,8 (4/800) q21d     05/17/2021 Imaging   1. No new suspicious mass or lymphadenopathy identified in the chest, abdomen or pelvis. 2. Interval decreased size of previous enlarged superior mediastinal and left retroperitoneal lymph nodes. 3. Other ancillary findings as described.   05/21/2021 -  Chemotherapy   Patient is on Treatment Plan : OVARIAN Gemcitabine D1,8 q21d and bevacizumab      CNS Oncologic History 01/22/21: R frontal craniotomy, resection with Dr. Reatha Armour 02/21/21: Post-op SRS and SRS to L parietal target Tammi Klippel)  Interval History: [name] [name] presents for follow up.  She describes no new or progressive neurologic deficits today. Still no frank seizure events, continues on Keppra 1049m BID.  Continues on carboplatin and gemcitabine chemotherapy with Dr. GAlvy Bimler now having completed 2 cycles.    H+P (12/25/20) Patient presented to the ED on 12/22/20 with twitching of her left side of her face for 1 to 2 minutes, accompanied by slurring of speech.  There was no tongue biting, bowel or  bladder incontinence.  Workup demonstrated two brain lesions consistent with suspected metastases.  She was started on Keppra (no decadron) and discharged two days later with no further seizure like events.  Priro to admission she had also been complaining of dizziness, confusion, impaired memory.  Has been seeing Dr. GAlvy Bimlersince 2021 for primary peritoneal carcinoma.   Medications: Current Outpatient Medications on File Prior to Visit  Medication Sig Dispense Refill   Azelaic Acid 15 % cream Apply 1 application topically daily.     cholecalciferol (VITAMIN D3) 25 MCG (1000 UNIT) tablet Take 2,000 Units by mouth daily.     dexamethasone (DECADRON) 0.5 MG tablet Take 1 tablet (0.5 mg) by mouth daily. 30 tablet 0   gabapentin (NEURONTIN) 300 MG capsule Take 300-600 mg by mouth See admin instructions. Taking 600 mg in am ;300 mg at lunch; 300 mg at hs.     levETIRAcetam (KEPPRA) 500 MG tablet TAKE 2 TABLETS BY MOUTH TWICE DAILY 120 tablet 1   lidocaine-prilocaine (EMLA) cream Apply 1 application topically as needed. 30 g 3   Magnesium 250 MG TABS Take 250 mg by mouth daily.     ondansetron (ZOFRAN) 8 MG tablet Take 1 tablet (8 mg total) by mouth every 8 (eight) hours as needed. 30 tablet 1   Probiotic Product (PROBIOTIC DAILY PO) Take 1 capsule by mouth daily.     prochlorperazine (COMPAZINE) 10 MG tablet Take 1 tablet (10 mg total) by mouth every 6 (six) hours as needed (Nausea or vomiting). 30 tablet 1   Sulfacetamide Sodium, Acne, 10 % LOTN Apply 1 application topically at bedtime.     VITAMIN E PO Take 50 Int'l Units/day by mouth daily.  No current facility-administered medications on file prior to visit.    Allergies:  Allergies  Allergen Reactions   Carboplatin Itching and Rash    Patient has a hypersensitivity reaction to carboplatin.  See note from 04/30/2021 at 1439.  Reaction symptoms included itching of mouth and ears, burning in chest, hypotension, and rash to neck and arms.   Patient unable to complete infusion.   Fentanyl Palpitations    Palpitations , fever ,headahe, N/V  " loud pounding heart beat"    Vancomycin Rash   Lac Bovis Diarrhea and Nausea And Vomiting   Brimonidine Tartrate     Other reaction(s): blurred vision   Ditropan [Oxybutynin]    Milk-Related Compounds Diarrhea and Nausea And Vomiting   Other Other (See Comments)   Oxybutynin Chloride Other (See Comments)    Ditropan  Pt doesn't remember    Thimerosal Other (See Comments)    Pt doesn't remember   Tramadol Other (See Comments)    Pt doesn't remember   Avelox [Moxifloxacin Hcl In Nacl] Rash   Doxycycline Rash   Erythromycin Rash   Metronidazole Rash   Minocycline Rash   Penicillins Rash    Did it involve swelling of the face/tongue/throat, SOB, or low BP? No Did it involve sudden or severe rash/hives, skin peeling, or any reaction on the inside of your mouth or nose? No Did you need to seek medical attention at a hospital or doctor's office? No When did it last happen?      Childhood allergy If all above answers are NO, may proceed with cephalosporin use.    Quinolones Rash   Sulfonamide Derivatives Rash   Past Medical History:  Past Medical History:  Diagnosis Date   Allergic rhinitis    Cervical dysplasia 1980   Cervicalgia    Eczema    Rosacea,dermatitis-Dr Stinehelfer   Esophageal reflux    Family history of colon cancer    Family history of melanoma    Family history of ovarian cancer    Family history of pancreatic cancer    Family history of prostate cancer    Family history of uterine cancer    Fibromyalgia    GERD (gastroesophageal reflux disease)    Glaucoma, narrow-angle    Heart murmur    Hiatal hernia    Irritable bowel syndrome    LBBB (left bundle branch block)    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), unspecified site    DCIS   Mass of lung    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 Arlyce Dice   Mitral valve disorders(424.0)    No antibiotics  required   Osteoporosis 12/2016   T score -2.3, 2014 T score -2.5 AP spine   PAC (premature atrial contraction) 2012   ,PVC's, and nonsustained atril tachycardia w aberration by heart monitor    PVC (premature ventricular contraction)    Rosacea    Stricture and stenosis of esophagus    Past Surgical History:  Past Surgical History:  Procedure Laterality Date   APPLICATION OF CRANIAL NAVIGATION N/A 01/22/2021   Procedure: APPLICATION OF CRANIAL NAVIGATION;  Surgeon: Karsten Ro, DO;  Location: [facility];  Service: Neurosurgery;  Laterality: N/A;   BREAST LUMPECTOMY  1998   right breast with radiation   CERVICAL DISCECTOMY     C3-7 with bone graft, in three different surgeries, have titanium plate and screws   CRANIOTOMY N/A 01/22/2021   Procedure: STEREOTACTIC RIGHT FRONTAL CRANIOTOMY FOR RESECTION OF TUMOR;  Surgeon: Karsten Ro,  DO;  Location: [facility];  Service: Neurosurgery;  Laterality: N/A;   GYNECOLOGIC CRYOSURGERY  1980   IR IMAGING GUIDED PORT INSERTION  12/24/2018   NECK SURGERY  1993-1997   bone graft and fusion   OMENTECTOMY  02/17/2019   REFRACTIVE SURGERY     narrow angle glucoma   RHINOPLASTY  1976   sterotactic large core needle biopsy right breast Right 10/15/1996   TUBAL LIGATION  1977   Social History:  Social History   Socioeconomic History   Marital status: Married    Spouse name: Antony Haste   Number of children: 0   Years of education: Not on file   Highest education level: Not on file  Occupational History   Occupation: Teacher, early years/pre: [employer]  Tobacco Use   Smoking status: Never   Smokeless tobacco: Never  Vaping Use   Vaping Use: Never used  Substance and Sexual Activity   Alcohol use: Yes    Alcohol/week: 2.0 standard drinks    Types: 2 Standard drinks or equivalent per week    Comment: Social    Drug use: No   Sexual activity: Not Currently    Birth control/protection: Post-menopausal    Comment: 1st intercourse 71 yo-  More than 5 partners  Other Topics Concern   Not on file  Social History Narrative   Not on file   Social Determinants of Health   Financial Resource Strain: Not on file  Food Insecurity: Not on file  Transportation Needs: Not on file  Physical Activity: Not on file  Stress: Not on file  Social Connections: Not on file  Intimate Partner Violence: Not on file   Family History:  Family History  Problem Relation Age of Onset   Heart disease Mother    Depression Mother    Hypertension Mother    Heart disease Father    Hypertension Father    Aneurysm Father    Uterine cancer Sister 37   Hypertension Sister    Ovarian cancer Sister 62   Melanoma Sister 61   Depression Sister    Melanoma Sister 19   Heart disease Paternal Grandfather    Stroke Maternal Grandmother    Cancer Maternal Grandfather 8       Pancreatic   Stroke Paternal Grandmother    Cancer Paternal Uncle        multiple myeloma, prostate cancer and melanoma (separate primaries)   Colon cancer Neg Hx     Review of Systems: Constitutional: Doesn't report fevers, chills or abnormal weight loss Eyes: Doesn't report blurriness of vision Ears, nose, mouth, throat, and face: Doesn't report sore throat Respiratory: Doesn't report cough, dyspnea or wheezes Cardiovascular: Doesn't report palpitation, chest discomfort  Gastrointestinal:  Doesn't report nausea, constipation, diarrhea GU: Doesn't report incontinence Skin: Doesn't report skin rashes Neurological: Per HPI Musculoskeletal: Doesn't report joint pain Behavioral/Psych: Doesn't report anxiety  Physical Exam: Wt Readings from Last 3 Encounters:  05/20/21 104 lb 6.4 oz (47.4 kg)  05/07/21 103 lb 11.2 oz (47 kg)  04/30/21 105 lb 3.2 oz (47.7 kg)   Temp Readings from Last 3 Encounters:  05/07/21 97.6 F (36.4 C) (Tympanic)  04/30/21 98 F (36.7 C) (Tympanic)  04/16/21 98.2 F (36.8 C) (Tympanic)   BP Readings from Last 3 Encounters:  05/20/21 (!)  126/45  05/07/21 (!) 120/48  04/30/21 (!) 99/45   Pulse Readings from Last 3 Encounters:  05/20/21 88  05/07/21 75  04/30/21 70   KPS:  80. General: Alert, cooperative, pleasant, in no acute distress Head: Normal EENT: No conjunctival injection or scleral icterus.  Lungs: Resp effort normal Cardiac: Regular rate Abdomen: Non-distended abdomen Skin: No rashes cyanosis or petechiae. Extremities: No clubbing or edema  Neurologic Exam: Mental Status: Awake, alert, attentive to examiner. Oriented to self and environment. Language is fluent with intact comprehension.  Cranial Nerves: Visual acuity is grossly normal. Visual fields are full. Extra-ocular movements intact. No ptosis. Face is symmetric Motor: Tone and bulk are normal. Power is full in both arms and legs. Reflexes are symmetric, no pathologic reflexes present.  Sensory: Intact to light touch Gait: Normal.   Labs: I have reviewed the data as listed    Component Value Date/Time   NA 138 05/17/2021 0740   K 3.5 05/17/2021 0740   CL 100 05/17/2021 0740   CO2 30 05/17/2021 0740   GLUCOSE 85 05/17/2021 0740   BUN 12 05/17/2021 0740   CREATININE 0.58 05/17/2021 0740   CALCIUM 9.6 05/17/2021 0740   PROT 7.2 05/17/2021 0740   ALBUMIN 4.4 05/17/2021 0740   AST 22 05/17/2021 0740   ALT 25 05/17/2021 0740   ALKPHOS 52 05/17/2021 0740   BILITOT 0.3 05/17/2021 0740   GFRNONAA >60 05/17/2021 0740   GFRAA >60 12/05/2019 1145   Lab Results  Component Value Date   WBC 5.1 05/17/2021   NEUTROABS 2.2 05/17/2021   HGB 10.6 (L) 05/17/2021   HCT 31.5 (L) 05/17/2021   MCV 93.8 05/17/2021   PLT 179 05/17/2021    Imaging:  [facility] Clinician Interpretation: I have personally reviewed the CNS images as listed.  My interpretation, in the context of the patient's clinical presentation, is stable disease  MR BRAIN W WO CONTRAST  Result Date: 05/16/2021 CLINICAL DATA:  Metastatic serous carcinoma; post op SRS EXAM: MRI HEAD WITHOUT  AND WITH CONTRAST TECHNIQUE: Multiplanar, multiecho pulse sequences of the brain and surrounding structures were obtained without and with intravenous contrast. CONTRAST:  32m MULTIHANCE GADOBENATE DIMEGLUMINE 529 MG/ML IV SOLN COMPARISON:  02/12/2021 FINDINGS: Brain: Evolution of postoperative changes. Resection cavity involving the right precentral gyrus is again identified with associated blood products. The cavity has contracted in the interval with resolution of surrounding edema. Adjacent surrounding chronic subarachnoid blood products. No significant residual extra-axial collection underlying the craniotomy. Enhancement remains at the superior margin of the resection cavity with mild nodularity (series 13, images 20 and 21). Left parietal enhancing lesion has decreased in size now measuring 4 mm (series 11, image 108). No new mass or abnormal enhancement. There is no acute infarction.  No hydrocephalus. Vascular: Major vessel flow voids at the skull base are preserved. Skull and upper cervical spine: Normal marrow signal is preserved. Right frontoparietal craniotomy. Partially imaged anterior cervical fusion at C3-C4. Sinuses/Orbits: Paranasal sinuses are aerated. Orbits are unremarkable. Other: Sella is unremarkable.  Mastoid air cells are clear. IMPRESSION: Elevation of postoperative changes in the right frontal lobe including resolution of edema. There is persistent enhancement at the superior margin of the resection cavity with mild nodularity. Decrease in size of left parietal lesion. No new metastasis. Electronically Signed   By: PMacy MisM.D.   On: 05/16/2021 14:03   CT CHEST ABDOMEN PELVIS W CONTRAST  Result Date: 05/17/2021 CLINICAL DATA:  Ovarian cancer, follow-up EXAM: CT CHEST, ABDOMEN, AND PELVIS WITH CONTRAST TECHNIQUE: Multidetector CT imaging of the chest, abdomen and pelvis was performed following the standard protocol during bolus administration of intravenous contrast. RADIATION  DOSE REDUCTION:  This exam was performed according to the departmental dose-optimization program which includes automated exposure control, adjustment of the mA and/or kV according to patient size and/or use of iterative reconstruction technique. CONTRAST:  110m OMNIPAQUE IOHEXOL 300 MG/ML  SOLN COMPARISON:  CT chest, abdomen and pelvis 03/19/2021 FINDINGS: CT CHEST FINDINGS Cardiovascular: Heart size is normal. No pericardial effusion identified. Main pulmonary artery is normal caliber. Thoracic aorta is normal in course and caliber. Right-sided central venous port noted. Mediastinum/Nodes: Interval decreased size of the superior mediastinal lymph node now measuring 7 mm in short axis. No bulky lymphadenopathy identified in the chest. Lungs/Pleura: No new or enlarged pulmonary nodule identified. Stable triangular consolidative density in the superior aspect of the right middle lobe which likely represents chronic segmental atelectasis/scarring. Stable 1.2 cm well-defined cystic structure abutting the heart inferior to the right hilum, possibly duplication cyst or pericardial cyst. Biapical pleural thickening. No pleural effusion or pneumothorax. Musculoskeletal: No suspicious bony lesions identified in the chest. CT ABDOMEN PELVIS FINDINGS Hepatobiliary: No focal liver abnormality is seen. No gallstones, gallbladder wall thickening, or biliary dilatation. Pancreas: Unremarkable. No pancreatic ductal dilatation or surrounding inflammatory changes. Spleen: Normal in size without focal abnormality. Adrenals/Urinary Tract: Adrenal glands are unremarkable. Kidneys are normal, without renal calculi, focal lesion, or hydronephrosis. Bladder is unremarkable. Stomach/Bowel: No bowel obstruction, free air or pneumatosis. No bowel wall edema or thickening identified. Large amount of retained fecal material in the colon. Appendix is normal. Vascular/Lymphatic: Mild-to-moderate atherosclerotic disease. Decreased size of the  mildly enlarged left retroperitoneal lymph node near the level of the kidneys now measuring 1.5 x 1 cm. No new adenopathy identified in the abdomen or pelvis. Reproductive: Status post hysterectomy. No adnexal masses. Other: No ascites or suspicious peritoneal nodularity visualized. Musculoskeletal: No suspicious bony lesions identified IMPRESSION: 1. No new suspicious mass or lymphadenopathy identified in the chest, abdomen or pelvis. 2. Interval decreased size of previous enlarged superior mediastinal and left retroperitoneal lymph nodes. 3. Other ancillary findings as described. Electronically Signed   By: DOfilia NeasM.D.   On: 05/17/2021 15:18     Assessment/Plan [place]  [name] WITTINGis clinically and radiographically stable today.  Recommended discontinuing decadron.  For now should continue Keppra 10077mBID for seizures.  We appreciate the opportunity to participate in the care of [name] [name] We ask that JaWANZA SZUMSKIeturn to clinic in 3 months following next brain MRI, or sooner as needed.  All questions were answered. The patient knows to call the clinic with any problems, questions or concerns. No barriers to learning were detected.  The total time spent in the encounter was 30 minutes and more than 50% was on counseling and review of test results   ZaVentura SellersMD Medical Director of Neuro-Oncology [facility] [place]3/06/23 10:39 AM

## 2021-05-20 NOTE — Telephone Encounter (Signed)
Returned her call. She said she left a message for Dr. Mickeal Skinner asking about Avastin. ? ?She is concerned about delayed healing after getting started on the Avastin. She will talk about it with her husband tonight and will be here at 8 am tomorrow.  She wants to talk with her husband. She is worried if she needed emergency surgery or dental work. ?

## 2021-05-20 NOTE — Assessment & Plan Note (Signed)
This is likely due to recent treatment. The patient denies recent history of bleeding such as epistaxis, hematuria or hematochezia. She is asymptomatic from the anemia. I will observe for now.   

## 2021-05-20 NOTE — Telephone Encounter (Signed)
Patient called with questions related to potential Avastin start. ?Routed to Dr. Calton Dach nurse. ?

## 2021-05-20 NOTE — Progress Notes (Signed)
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[facility]) I have personally reviewed her CT imaging She have excellent response to treatment She tolerated treatment very well Her baseline blood pressure is within normal range We have a long discussion about the risk and benefits of continuing single agent gemcitabine only without carboplatin due to allergic reaction, combination of gemcitabine with bevacizumab or single agent bevacizumab The patient is concerned about feeling fatigue I suspect the combination of chemotherapy with carboplatin might have contributed to her sensation of fatigue but with single agent gemcitabine only, her sensation of fatigue might be less The patient has expressed significant concerns about the use of bevacizumab We go through side effects of bevacizumab again Overall, at the end of the visit, she is in agreement to proceed with combination of gemcitabine with bevacizumab starting tomorrow I will see her next week for further follow-up and toxicity reviewed I recommend minimum 3 months of treatment before repeating CT imaging, around June of this year  Anemia due to antineoplastic chemotherapy This is likely due to recent treatment. The patient denies recent history of bleeding such as epistaxis, hematuria or hematochezia. She is asymptomatic from the anemia. I will observe for now.    Brain metastases [facility]) MRI shows stability She has appointment to see neuro oncologist for further follow-up There is no contraindication for her to proceed with radiation treatment if needed  No orders of the defined types were placed in this encounter.   All questions were answered. The patient knows to call the clinic with any problems, questions or  concerns. The total time spent in the appointment was 40 minutes encounter with patients including review of chart and various tests results, discussions about plan of care and coordination of care plan   [name], MD 05/20/2021 10:29 AM  INTERVAL HISTORY: Please see below for problem oriented charting. she returns for treatment follow-up She is doing well except for fatigue with her recent treatment She had numerous questions related to plan of care  REVIEW OF SYSTEMS:   Constitutional: Denies fevers, chills or abnormal weight loss Eyes: Denies blurriness of vision Ears, nose, mouth, throat, and face: Denies mucositis or sore throat Respiratory: Denies cough, dyspnea or wheezes Cardiovascular: Denies palpitation, chest discomfort or lower extremity swelling Gastrointestinal:  Denies nausea, heartburn or change in bowel habits Skin: Denies abnormal skin rashes Lymphatics: Denies new lymphadenopathy or easy bruising Neurological:Denies numbness, tingling or new weaknesses Behavioral/Psych: Mood is stable, no new changes  All other systems were reviewed with the patient and are negative.  I have reviewed the past medical history, past surgical history, social history and family history with the patient and they are unchanged from previous note.  ALLERGIES:  is allergic to carboplatin, fentanyl, vancomycin, lac bovis, brimonidine tartrate, ditropan [oxybutynin], milk-related compounds, other, oxybutynin chloride, thimerosal, tramadol, avelox [moxifloxacin hcl in nacl], doxycycline, erythromycin, metronidazole, minocycline, penicillins, quinolones, and sulfonamide derivatives.  MEDICATIONS:  Current Outpatient Medications  Medication Sig Dispense Refill   Azelaic Acid 15 % cream Apply 1 application topically daily.     cholecalciferol (VITAMIN D3) 25 MCG (1000 UNIT) tablet Take 2,000 Units by mouth daily.     dexamethasone (DECADRON) 0.5 MG tablet Take 1 tablet (0.5 mg) by mouth daily.  30 tablet 0  gabapentin (NEURONTIN) 300 MG capsule Take 300-600 mg by mouth See admin instructions. Taking 600 mg in am ;300 mg at lunch; 300 mg at hs.     levETIRAcetam (KEPPRA) 500 MG tablet TAKE 2 TABLETS BY MOUTH TWICE DAILY 120 tablet 1   lidocaine-prilocaine (EMLA) cream Apply 1 application topically as needed. 30 g 3   Magnesium 250 MG TABS Take 250 mg by mouth daily.     ondansetron (ZOFRAN) 8 MG tablet Take 1 tablet (8 mg total) by mouth every 8 (eight) hours as needed. 30 tablet 1   Probiotic Product (PROBIOTIC DAILY PO) Take 1 capsule by mouth daily.     prochlorperazine (COMPAZINE) 10 MG tablet Take 1 tablet (10 mg total) by mouth every 6 (six) hours as needed (Nausea or vomiting). 30 tablet 1   Sulfacetamide Sodium, Acne, 10 % LOTN Apply 1 application topically at bedtime.     VITAMIN E PO Take 50 Int'l Units/day by mouth daily.     No current facility-administered medications for this visit.    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 Findings highly suspicious for peritoneal carcinomatosis. Recommend paracentesis for therapeutic and diagnostic purposes. I do not see an obvious primary lesion but there is some irregular enhancing soft tissue in the right adnexal area. CA 125 level may be helpful. 2. Mild surface irregularity involving the liver but I do not see any obvious changes of cirrhosis. No hepatic lesions.   11/23/2018 Tumor Marker   Patient's tumor was tested for the following markers: CA-125 Results of the tumor marker test revealed 253.   11/25/2018 Initial Diagnosis   Peritoneal carcinoma ([place])   11/25/2018 Imaging   US paracentesis Successful ultrasound-guided paracentesis yielding 3.4 L of peritoneal fluid.   12/01/2018 Cancer Staging   Staging form:  Ovary, Fallopian Tube, and Primary Peritoneal Carcinoma, AJCC 8th Edition - Clinical stage from 12/01/2018: Stage IVA (rcT3, cN0, pM1a) - Signed by [name], MD on 02/14/2021 Stage prefix: Recurrence    12/02/2018 Tumor Marker   Patient's tumor was tested for the following markers: CA-125 Results of the tumor marker test revealed 267   12/06/2018 - 05/16/2019 Chemotherapy   The patient had carboplatin and taxol for chemotherapy treatment.     12/08/2018 Procedure   Successful ultrasound-guided therapeutic paracentesis yielding 1.7 liters of peritoneal fluid.   12/24/2018 Procedure   Successful placement of a right IJ approach Power Port with ultrasound and fluoroscopic guidance. The catheter is ready for use   01/17/2019 Tumor Marker   Patient's tumor was tested for the following markers: CA-125 Results of the tumor marker test revealed 57.2   01/31/2019 Imaging   Significant decrease in peritoneal carcinomatosis since previous study. Interval resolution of ascites.   Focal area of parenchymal consolidation in central right middle lobe, which measures 3 cm. Differential diagnosis includes infectious or inflammatory process, atelectasis, and neoplasm. Recommend short-term follow-up by chest CT in 2-3 months.     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   Surgeon: Donaciano Eva    Operative Findings:  : grossly normal uterus, ovaries normal, few scattered peritoneal  nodules (59m) on serosa of uterus and tubes. The omentum (gastrocolic) was tethered to the mesentery with tumor (thin rind). Complete (optimal) resection of tumor with no gross residual disease.     02/17/2019 Pathology Results   FINAL MICROSCOPIC DIAGNOSIS: A. UTERUS,  CERVIX, BILATERAL FALLOPIAN TUBES AND OVARIES, HYSTERECTOMY WITH SALPINGOOOPHORECTOMY: - Uterus: Endometrium: Inactive endometrium. No hyperplasia or malignancy. Myometrium: Unremarkable. No malignancy. Serosa: Metastatic carcinoma. No malignancy. - Cervix: Benign squamous and endocervical mucosa. No dysplasia or malignancy. - Left ovary and fallopian tube: Metastatic carcinoma. - Right ovary: No malignancy identified. - Right fallopian tube: Luminal tumor, see comment. B. OMENTUM, RESECTION: - High grade serous carcinoma. - Deposits up to at least 3 cm. - See oncology table. ONCOLOGY TABLE: OVARY or FALLOPIAN TUBE or PRIMARY PERITONEUM: Procedure: Hysterectomy with bilateral salpingo-oophorectomy and omentectomy. Specimen Integrity: Intact Tumor Site: Peritoneum Ovarian Surface Involvement (required only if applicable): Left ovary Fallopian Tube Surface Involvement (required only if applicable): Left Fallopian tube Tumor Size: Largest deposit 3 cm Histologic Type: High-grade serous carcinoma Histologic Grade: High-grade Implants (required for advanced stage serous/seromucinous borderline tumors only): Uterine serosa, left fallopian tube and ovary, omentum Other Tissue/ Organ Involvement: As above Largest Extrapelvic Peritoneal Focus (required only if applicable): 3 cm Peritoneal/Ascitic Fluid: Pre neoadjuvant [id number] positive for carcinoma. Treatment Effect (required only for high-grade serous carcinomas): Probably partial treatment effect in omental tissue. Regional Lymph Nodes: No lymph nodes submitted or found Pathologic Stage Classification (pTNM, AJCC 8th Edition): ypT3c, ypNX Representative Tumor Block: B5 Comment(s): There is surface involvement of the left ovary and fallopian tube but no primary tumor. Within the right fallopian tube lumen there is detached fragments of tumor but again no precursor lesion is noted in the right fallopian tube. Thus, the tumor is presumed  primary peritoneal and staged as such.   03/21/2019 Tumor Marker   Patient's tumor was tested for the following markers: CA-125 Results of the tumor marker test revealed 15.1    Genetic Testing   Negative genetic testing. No pathogenic variants identified on the [employer] CancerNext + RNAinsight panel. VUS in ATM called c.[id number] identified. The report date is 05/12/2019. TumorNext HRD was originally ordered but there was not enough sample to complete this testing.   The CancerNext+RNAinsight gene panel offered by [employer] Fort[employer] sequencing and rearrangement analysis for the following 36 genes: APC*, ATM*, AXIN2, BARD1, BMPR1A, BRCA1*, BRCA2*, BRIP1*, CDH1*, CDK4, CDKN2A, CHEK2*, DICER1, MLH1*, MSH2*, MSH3, MSH6*, MUTYH*, NBN, NF1*, NTHL1, PALB2*, PMS2*, PTEN*, RAD51C*, RAD51D*, RECQL, SMAD4, SMARCA4, STK11 and TP53* (sequencing and deletion/duplication); HOXB13, POLD1 and POLE (sequencing only); EPCAM and GREM1 (deletion/duplication only). DNA and RNA analyses performed for * genes.    05/16/2019 Tumor Marker   Patient's tumor was tested for the following markers: CA-125 Results of the tumor marker test revealed 10.7   06/16/2019 Imaging   1. No evidence of residual or recurrent metastatic disease in the chest, abdomen or pelvis. 2. Masslike focus of consolidation in the right middle lobe along the major and minor fissures with associated volume loss is stable since 01/31/2019. Indolent primary pulmonary neoplasm not excluded. PET-CT may be considered for further characterization. 3. Aortic Atherosclerosis (ICD10-I70.0).   06/16/2019 Tumor Marker   Patient's tumor was tested for the following markers: CA-125 Results of the tumor marker test revealed 9.1.   12/05/2019 Tumor Marker   Patient's tumor was tested for the following markers: CA-125. Results of the tumor marker test revealed 9.0.   02/29/2020 Tumor Marker   Patient's tumor  was tested for the following markers: CA-`125 Results of  the tumor marker test revealed 9.7.   05/28/2020 Tumor Marker   Patient's tumor was tested for the following markers: CA-125 Results of the tumor marker test revealed 9.5   08/16/2020 Tumor Marker   Patient's tumor was tested for the following markers: CA-125 Results of the tumor marker test revealed 10.9   11/29/2020 Tumor Marker   Patient's tumor was tested for the following markers: CA-125. Results of the tumor marker test revealed 10.2.   01/22/2021 Pathology Results   SURGICAL PATHOLOGY  CASE: [id number]  PATIENT: Sampson Goon  Surgical Pathology Report   Clinical History: brain metastasis (cm)    FINAL MICROSCOPIC DIAGNOSIS:   A. 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    03/20/2021 Imaging   1. Unchanged enlarged midline superior mediastinal and left retroperitoneal lymph nodes, which remain suspicious for metastatic disease. 2. Unchanged masslike consolidation and volume loss of the right middle lobe measuring 3.8 x 1.9 cm, likely chronic scarring. Continued attention on follow-up. 3. No new evidence of metastatic disease in the chest, abdomen, or pelvis. 4. Status post hysterectomy and omentectomy.   Aortic Atherosclerosis (ICD10-I70.0).     03/21/2021 - 05/07/2021 Chemotherapy   Patient is on Treatment Plan : OVARIAN RECURRENT 3RD LINE Carboplatin D1 / Gemcitabine D1,8 (4/800) q21d     05/17/2021 Imaging   1. No new suspicious mass or lymphadenopathy identified in the chest, abdomen or pelvis. 2. Interval decreased size of previous enlarged superior mediastinal and left retroperitoneal lymph nodes. 3. Other ancillary findings as described.   05/21/2021 -  Chemotherapy   Patient is on Treatment Plan : OVARIAN Gemcitabine D1,8 q21d and bevacizumab       PHYSICAL EXAMINATION: ECOG PERFORMANCE STATUS: 1 - Symptomatic but completely  ambulatory  Vitals:   05/20/21 0938  BP: (!) 126/45  Pulse: 88  Resp: 18  SpO2: 100%   Filed Weights   05/20/21 0938  Weight: 104 lb 6.4 oz (47.4 kg)    GENERAL:alert, no distress and comfortable  NEURO: alert & oriented x 3 with fluent speech, no focal motor/sensory deficits  LABORATORY DATA:  I have reviewed the data as listed    Component Value Date/Time   NA 138 05/17/2021 0740   K 3.5 05/17/2021 0740   CL 100 05/17/2021 0740   CO2 30 05/17/2021 0740   GLUCOSE 85 05/17/2021 0740   BUN 12 05/17/2021 0740   CREATININE 0.58 05/17/2021 0740   CALCIUM 9.6 05/17/2021 0740   PROT 7.2 05/17/2021 0740   ALBUMIN 4.4 05/17/2021 0740   AST 22 05/17/2021 0740   ALT 25 05/17/2021 0740   ALKPHOS 52 05/17/2021 0740   BILITOT 0.3 05/17/2021 0740   GFRNONAA >60 05/17/2021 0740   GFRAA >60 12/05/2019 1145    No results found for: SPEP, UPEP  Lab Results  Component Value Date   WBC 5.1 05/17/2021   NEUTROABS 2.2 05/17/2021   HGB 10.6 (L) 05/17/2021   HCT 31.5 (L) 05/17/2021   MCV 93.8 05/17/2021   PLT 179 05/17/2021      Chemistry      Component Value Date/Time   NA 138 05/17/2021 0740   K 3.5 05/17/2021 0740   CL 100 05/17/2021 0740   CO2 30 05/17/2021 0740   BUN 12 05/17/2021 0740   CREATININE 0.58 05/17/2021 0740  Component Value Date/Time   CALCIUM 9.6 05/17/2021 0740   ALKPHOS 52 05/17/2021 0740   AST 22 05/17/2021 0740   ALT 25 05/17/2021 0740   BILITOT 0.3 05/17/2021 0740       RADIOGRAPHIC STUDIES: I have personally reviewed the radiological images as listed and agreed with the findings in the report. MR BRAIN W WO CONTRAST  Result Date: 05/16/2021 CLINICAL DATA:  Metastatic serous carcinoma; post op SRS EXAM: MRI HEAD WITHOUT AND WITH CONTRAST TECHNIQUE: Multiplanar, multiecho pulse sequences of the brain and surrounding structures were obtained without and with intravenous contrast. CONTRAST:  81m MULTIHANCE GADOBENATE DIMEGLUMINE 529 MG/ML IV  SOLN COMPARISON:  02/12/2021 FINDINGS: Brain: Evolution of postoperative changes. Resection cavity involving the right precentral gyrus is again identified with associated blood products. The cavity has contracted in the interval with resolution of surrounding edema. Adjacent surrounding chronic subarachnoid blood products. No significant residual extra-axial collection underlying the craniotomy. Enhancement remains at the superior margin of the resection cavity with mild nodularity (series 13, images 20 and 21). Left parietal enhancing lesion has decreased in size now measuring 4 mm (series 11, image 108). No new mass or abnormal enhancement. There is no acute infarction.  No hydrocephalus. Vascular: Major vessel flow voids at the skull base are preserved. Skull and upper cervical spine: Normal marrow signal is preserved. Right frontoparietal craniotomy. Partially imaged anterior cervical fusion at C3-C4. Sinuses/Orbits: Paranasal sinuses are aerated. Orbits are unremarkable. Other: Sella is unremarkable.  Mastoid air cells are clear. IMPRESSION: Elevation of postoperative changes in the right frontal lobe including resolution of edema. There is persistent enhancement at the superior margin of the resection cavity with mild nodularity. Decrease in size of left parietal lesion. No new metastasis. Electronically Signed   By: PMacy MisM.D.   On: 05/16/2021 14:03   CT CHEST ABDOMEN PELVIS W CONTRAST  Result Date: 05/17/2021 CLINICAL DATA:  Ovarian cancer, follow-up EXAM: CT CHEST, ABDOMEN, AND PELVIS WITH CONTRAST TECHNIQUE: Multidetector CT imaging of the chest, abdomen and pelvis was performed following the standard protocol during bolus administration of intravenous contrast. RADIATION DOSE REDUCTION: This exam was performed according to the departmental dose-optimization program which includes automated exposure control, adjustment of the mA and/or kV according to patient size and/or use of iterative  reconstruction technique. CONTRAST:  1090mOMNIPAQUE IOHEXOL 300 MG/ML  SOLN COMPARISON:  CT chest, abdomen and pelvis 03/19/2021 FINDINGS: CT CHEST FINDINGS Cardiovascular: Heart size is normal. No pericardial effusion identified. Main pulmonary artery is normal caliber. Thoracic aorta is normal in course and caliber. Right-sided central venous port noted. Mediastinum/Nodes: Interval decreased size of the superior mediastinal lymph node now measuring 7 mm in short axis. No bulky lymphadenopathy identified in the chest. Lungs/Pleura: No new or enlarged pulmonary nodule identified. Stable triangular consolidative density in the superior aspect of the right middle lobe which likely represents chronic segmental atelectasis/scarring. Stable 1.2 cm well-defined cystic structure abutting the heart inferior to the right hilum, possibly duplication cyst or pericardial cyst. Biapical pleural thickening. No pleural effusion or pneumothorax. Musculoskeletal: No suspicious bony lesions identified in the chest. CT ABDOMEN PELVIS FINDINGS Hepatobiliary: No focal liver abnormality is seen. No gallstones, gallbladder wall thickening, or biliary dilatation. Pancreas: Unremarkable. No pancreatic ductal dilatation or surrounding inflammatory changes. Spleen: Normal in size without focal abnormality. Adrenals/Urinary Tract: Adrenal glands are unremarkable. Kidneys are normal, without renal calculi, focal lesion, or hydronephrosis. Bladder is unremarkable. Stomach/Bowel: No bowel obstruction, free air or pneumatosis. No bowel wall edema or  thickening identified. Large amount of retained fecal material in the colon. Appendix is normal. Vascular/Lymphatic: Mild-to-moderate atherosclerotic disease. Decreased size of the mildly enlarged left retroperitoneal lymph node near the level of the kidneys now measuring 1.5 x 1 cm. No new adenopathy identified in the abdomen or pelvis. Reproductive: Status post hysterectomy. No adnexal masses.  Other: No ascites or suspicious peritoneal nodularity visualized. Musculoskeletal: No suspicious bony lesions identified IMPRESSION: 1. No new suspicious mass or lymphadenopathy identified in the chest, abdomen or pelvis. 2. Interval decreased size of previous enlarged superior mediastinal and left retroperitoneal lymph nodes. 3. Other ancillary findings as described. Electronically Signed   By: Ofilia Neas M.D.   On: 05/17/2021 15:18

## 2021-05-20 NOTE — Telephone Encounter (Signed)
[name] [name], she is scheduled for tomorrow. Can you ask what her question is? ?

## 2021-05-20 NOTE — Assessment & Plan Note (Signed)
I have personally reviewed her CT imaging ?She have excellent response to treatment ?She tolerated treatment very well ?Her baseline blood pressure is within normal range ?We have a long discussion about the risk and benefits of continuing single agent gemcitabine only without carboplatin due to allergic reaction, combination of gemcitabine with bevacizumab or single agent bevacizumab ?The patient is concerned about feeling fatigue ?I suspect the combination of chemotherapy with carboplatin might have contributed to her sensation of fatigue but with single agent gemcitabine only, her sensation of fatigue might be less ?The patient has expressed significant concerns about the use of bevacizumab ?We go through side effects of bevacizumab again ?Overall, at the end of the visit, she is in agreement to proceed with combination of gemcitabine with bevacizumab starting tomorrow ?I will see her next week for further follow-up and toxicity reviewed ?I recommend minimum 3 months of treatment before repeating CT imaging, around June of this year ?

## 2021-05-20 NOTE — Assessment & Plan Note (Signed)
MRI shows stability ?She has appointment to see neuro oncologist for further follow-up ?There is no contraindication for her to proceed with radiation treatment if needed ?

## 2021-05-21 ENCOUNTER — Inpatient Hospital Stay: Payer: Medicare Other

## 2021-05-21 ENCOUNTER — Telehealth: Payer: Self-pay | Admitting: Internal Medicine

## 2021-05-21 ENCOUNTER — Other Ambulatory Visit: Payer: Self-pay | Admitting: Hematology and Oncology

## 2021-05-21 VITALS — BP 131/55 | HR 75 | Temp 98.1°F | Resp 17

## 2021-05-21 DIAGNOSIS — C482 Malignant neoplasm of peritoneum, unspecified: Secondary | ICD-10-CM

## 2021-05-21 DIAGNOSIS — T451X5D Adverse effect of antineoplastic and immunosuppressive drugs, subsequent encounter: Secondary | ICD-10-CM | POA: Diagnosis not present

## 2021-05-21 DIAGNOSIS — C7931 Secondary malignant neoplasm of brain: Secondary | ICD-10-CM | POA: Diagnosis not present

## 2021-05-21 DIAGNOSIS — R5383 Other fatigue: Secondary | ICD-10-CM | POA: Diagnosis not present

## 2021-05-21 DIAGNOSIS — Z5111 Encounter for antineoplastic chemotherapy: Secondary | ICD-10-CM | POA: Diagnosis not present

## 2021-05-21 DIAGNOSIS — D6481 Anemia due to antineoplastic chemotherapy: Secondary | ICD-10-CM | POA: Diagnosis not present

## 2021-05-21 MED ORDER — HEPARIN SOD (PORK) LOCK FLUSH 100 UNIT/ML IV SOLN
500.0000 [IU] | Freq: Once | INTRAVENOUS | Status: AC | PRN
  Administered 2021-05-21: 500 [IU]

## 2021-05-21 MED ORDER — SODIUM CHLORIDE 0.9% FLUSH
10.0000 mL | INTRAVENOUS | Status: DC | PRN
  Administered 2021-05-21: 10 mL

## 2021-05-21 MED ORDER — PROCHLORPERAZINE MALEATE 10 MG PO TABS
10.0000 mg | ORAL_TABLET | Freq: Once | ORAL | Status: AC
  Administered 2021-05-21: 10 mg via ORAL
  Filled 2021-05-21: qty 1

## 2021-05-21 MED ORDER — SODIUM CHLORIDE 0.9 % IV SOLN
800.0000 mg/m2 | Freq: Once | INTRAVENOUS | Status: AC
  Administered 2021-05-21: 1178 mg via INTRAVENOUS
  Filled 2021-05-21: qty 30.98

## 2021-05-21 MED ORDER — SODIUM CHLORIDE 0.9 % IV SOLN: Freq: Once | INTRAVENOUS | Status: AC

## 2021-05-21 NOTE — Patient Instructions (Signed)
[facility]  Discharge Instructions: Thank you for choosing [facility] to provide your oncology and hematology care.   If you have a lab appointment with the Cancer Center, please go directly to the Cancer Center and check in at the registration area.   Wear comfortable clothing and clothing appropriate for easy access to any Portacath or PICC line.   We strive to give you quality time with your provider. You may need to reschedule your appointment if you arrive late (15 or more minutes).  Arriving late affects you and other patients whose appointments are after yours.  Also, if you miss three or more appointments without notifying the office, you may be dismissed from the clinic at the provider's discretion.      For prescription refill requests, have your pharmacy contact our office and allow 72 hours for refills to be completed.    Today you received the following chemotherapy and/or immunotherapy agents Gemzar      To help prevent nausea and vomiting after your treatment, we encourage you to take your nausea medication as directed.  BELOW ARE SYMPTOMS THAT SHOULD BE REPORTED IMMEDIATELY: *FEVER GREATER THAN 100.4 F (38 C) OR HIGHER *CHILLS OR SWEATING *NAUSEA AND VOMITING THAT IS NOT CONTROLLED WITH YOUR NAUSEA MEDICATION *UNUSUAL SHORTNESS OF BREATH *UNUSUAL BRUISING OR BLEEDING *URINARY PROBLEMS (pain or burning when urinating, or frequent urination) *BOWEL PROBLEMS (unusual diarrhea, constipation, pain near the anus) TENDERNESS IN MOUTH AND THROAT WITH OR WITHOUT PRESENCE OF ULCERS (sore throat, sores in mouth, or a toothache) UNUSUAL RASH, SWELLING OR PAIN  UNUSUAL VAGINAL DISCHARGE OR ITCHING   Items with * indicate a potential emergency and should be followed up as soon as possible or go to the Emergency Department if any problems should occur.  Please show the CHEMOTHERAPY ALERT CARD or IMMUNOTHERAPY ALERT CARD at check-in to the  Emergency Department and triage nurse.  Should you have questions after your visit or need to cancel or reschedule your appointment, please contact [facility]  Dept: [phone]  and follow the prompts.  Office hours are 8:00 a.m. to 4:30 p.m. Monday - Friday. Please note that voicemails left after 4:00 p.m. may not be returned until the following business day.  We are closed weekends and major holidays. You have access to a nurse at all times for urgent questions. Please call the main number to the clinic Dept: [phone] and follow the prompts.   For any non-urgent questions, you may also contact your provider using MyChart. We now offer e-Visits for anyone 18 and older to request care online for non-urgent symptoms. For details visit [email or web address].   Also download the MyChart app! Go to the app store, search "MyChart", open the app, select , and log in with your MyChart username and password.  Due to Covid, a mask is required upon entering the hospital/clinic. If you do not have a mask, one will be given to you upon arrival. For doctor visits, patients may have 1 support person aged 18 or older with them. For treatment visits, patients cannot have anyone with them due to current Covid guidelines and our immunocompromised population.   

## 2021-05-21 NOTE — Telephone Encounter (Signed)
Scheduled per 3/6 los, pt has been called and confirmed  ?

## 2021-05-22 ENCOUNTER — Other Ambulatory Visit: Payer: Self-pay | Admitting: Radiation Therapy

## 2021-05-23 ENCOUNTER — Ambulatory Visit: Payer: Medicare Other | Admitting: Hematology and Oncology

## 2021-05-28 ENCOUNTER — Encounter: Payer: Self-pay | Admitting: Hematology and Oncology

## 2021-05-28 ENCOUNTER — Inpatient Hospital Stay (HOSPITAL_BASED_OUTPATIENT_CLINIC_OR_DEPARTMENT_OTHER): Payer: Medicare Other | Admitting: Hematology and Oncology

## 2021-05-28 ENCOUNTER — Inpatient Hospital Stay: Payer: Medicare Other

## 2021-05-28 ENCOUNTER — Other Ambulatory Visit: Payer: Self-pay

## 2021-05-28 DIAGNOSIS — D6481 Anemia due to antineoplastic chemotherapy: Secondary | ICD-10-CM | POA: Diagnosis not present

## 2021-05-28 DIAGNOSIS — T451X5D Adverse effect of antineoplastic and immunosuppressive drugs, subsequent encounter: Secondary | ICD-10-CM | POA: Diagnosis not present

## 2021-05-28 DIAGNOSIS — F19930 Other psychoactive substance use, unspecified with withdrawal, uncomplicated: Secondary | ICD-10-CM

## 2021-05-28 DIAGNOSIS — C482 Malignant neoplasm of peritoneum, unspecified: Secondary | ICD-10-CM

## 2021-05-28 DIAGNOSIS — C7931 Secondary malignant neoplasm of brain: Secondary | ICD-10-CM | POA: Diagnosis not present

## 2021-05-28 DIAGNOSIS — D61818 Other pancytopenia: Secondary | ICD-10-CM

## 2021-05-28 DIAGNOSIS — R5383 Other fatigue: Secondary | ICD-10-CM | POA: Diagnosis not present

## 2021-05-28 DIAGNOSIS — Z5111 Encounter for antineoplastic chemotherapy: Secondary | ICD-10-CM | POA: Diagnosis not present

## 2021-05-28 LAB — CBC WITH DIFFERENTIAL (CANCER CENTER ONLY)
Abs Immature Granulocytes: 0.01 10*3/uL (ref 0.00–0.07)
Basophils Absolute: 0 10*3/uL (ref 0.0–0.1)
Basophils Relative: 1 %
Eosinophils Absolute: 0 10*3/uL (ref 0.0–0.5)
Eosinophils Relative: 1 %
HCT: 29.4 % — ABNORMAL LOW (ref 36.0–46.0)
Hemoglobin: 9.9 g/dL — ABNORMAL LOW (ref 12.0–15.0)
Immature Granulocytes: 0 %
Lymphocytes Relative: 29 %
Lymphs Abs: 1 10*3/uL (ref 0.7–4.0)
MCH: 31.7 pg (ref 26.0–34.0)
MCHC: 33.7 g/dL (ref 30.0–36.0)
MCV: 94.2 fL (ref 80.0–100.0)
Monocytes Absolute: 0.5 10*3/uL (ref 0.1–1.0)
Monocytes Relative: 13 %
Neutro Abs: 2 10*3/uL (ref 1.7–7.7)
Neutrophils Relative %: 56 %
Platelet Count: 315 10*3/uL (ref 150–400)
RBC: 3.12 MIL/uL — ABNORMAL LOW (ref 3.87–5.11)
RDW: 16.2 % — ABNORMAL HIGH (ref 11.5–15.5)
WBC Count: 3.5 10*3/uL — ABNORMAL LOW (ref 4.0–10.5)
nRBC: 0 % (ref 0.0–0.2)

## 2021-05-28 LAB — CMP (CANCER CENTER ONLY)
ALT: 22 U/L (ref 0–44)
AST: 23 U/L (ref 15–41)
Albumin: 4 g/dL (ref 3.5–5.0)
Alkaline Phosphatase: 59 U/L (ref 38–126)
Anion gap: 9 (ref 5–15)
BUN: 10 mg/dL (ref 8–23)
CO2: 27 mmol/L (ref 22–32)
Calcium: 9.1 mg/dL (ref 8.9–10.3)
Chloride: 104 mmol/L (ref 98–111)
Creatinine: 0.55 mg/dL (ref 0.44–1.00)
GFR, Estimated: >60 mL/min (ref >60)
Glucose, Bld: 90 mg/dL (ref 70–99)
Potassium: 3.6 mmol/L (ref 3.5–5.1)
Sodium: 140 mmol/L (ref 135–145)
Total Bilirubin: 0.3 mg/dL (ref 0.3–1.2)
Total Protein: 6.6 g/dL (ref 6.5–8.1)

## 2021-05-28 MED ORDER — SODIUM CHLORIDE 0.9% FLUSH
10.0000 mL | Freq: Once | INTRAVENOUS | Status: AC
  Administered 2021-05-28: 10 mL

## 2021-05-28 NOTE — Assessment & Plan Note (Signed)
We have a very long discussion about the role of an additional bevacizumab ?At this point in time, I explained to the patient there is no need to worry about not adding bevacizumab ?She is experiencing significant fatigue, loss of appetite and flare of arthritis/inflammation of her tendon around her ankle likely due to side effects with withdrawal from recent dexamethasone ?I clarified she does not need taper because the dose of dexamethasone was already at the lowest dose ?It is a process that she needs to slowly adapt to the lack of steroids ?She is also developing pancytopenia from chemotherapy ?I am concerned that she will not be able to enjoy her time with family next week due to high risk of worsening pancytopenia if we were to proceed with treatment today ?After very long discussion, she is in agreement to omit the dose of treatment today and resume in 2 weeks with new cycle of treatment ?

## 2021-05-28 NOTE — Assessment & Plan Note (Signed)
She has signs and symptoms of steroid withdrawal with reduced appetite, fatigue, flare of tendinitis and hypotension ?Overall, she does not need to resume dexamethasone or undergo a specific taper ?Continue supportive care at this point ?

## 2021-05-28 NOTE — Progress Notes (Signed)
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[facility]) We have a very long discussion about the role of an additional bevacizumab At this point in time, I explained to the patient there is no need to worry about not adding bevacizumab She is experiencing significant fatigue, loss of appetite and flare of arthritis/inflammation of her tendon around her ankle likely due to side effects with withdrawal from recent dexamethasone I clarified she does not need taper because the dose of dexamethasone was already at the lowest dose It is a process that she needs to slowly adapt to the lack of steroids She is also developing pancytopenia from chemotherapy I am concerned that she will not be able to enjoy her time with family next week due to high risk of worsening pancytopenia if we were to proceed with treatment today After very long discussion, she is in agreement to omit the dose of treatment today and resume in 2 weeks with new cycle of treatment  Pancytopenia, acquired (HCC) She has slight worsening pancytopenia likely due to side effects of treatment As above, after long discussion, she is in agreement to omit treatment today She does not need transfusion support  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   All questions were answered. The patient knows to call the clinic with any problems, questions or concerns. The total time spent in the appointment was 40  minutes encounter with patients including review of chart and various tests results, discussions about plan of care and coordination of care plan   Artis Delay, MD 05/28/2021 11:15 AM  INTERVAL HISTORY: Please see below for problem oriented charting. she returns for treatment follow-up seen prior to cycle 1 day 8 of Gemzar Last week, she decided not to pursue treatment with bevacizumab She has significant concerns about risk of wound healing, hypertension with bevacizumab She shared with me the plan to visit with her sister and family next week She has stopped taking dexamethasone and felt lousy and difficulties getting out of her couch She has reduced appetite, excessive fatigue, flare of tendinitis around her ankle and noticeable low blood pressure today  REVIEW OF SYSTEMS:   Constitutional: Denies fevers, chills or abnormal weight loss Eyes: Denies blurriness of vision Ears, nose, mouth, throat, and face: Denies mucositis or sore throat Respiratory: Denies cough, dyspnea or wheezes Cardiovascular: Denies palpitation, chest discomfort or lower extremity swelling Gastrointestinal:  Denies nausea, heartburn or change in bowel habits Skin: Denies abnormal skin rashes Lymphatics: Denies new lymphadenopathy or easy bruising Behavioral/Psych: Mood is stable, no new changes  All other systems were reviewed with the patient and are negative.  I have reviewed the past medical history, past surgical history, social history and family history with the patient and they are unchanged from previous note.  ALLERGIES:  is allergic to carboplatin, fentanyl, vancomycin, lac bovis, brimonidine tartrate, ditropan [oxybutynin], milk-related compounds, other, oxybutynin chloride, thimerosal, tramadol, avelox [moxifloxacin hcl in nacl], doxycycline, erythromycin, metronidazole, minocycline, penicillins, quinolones, and sulfonamide derivatives.  MEDICATIONS:  Current Outpatient  Medications  Medication Sig  Dispense Refill   Azelaic Acid 15 % cream Apply 1 application topically daily.     cholecalciferol (VITAMIN D3) 25 MCG (1000 UNIT) tablet Take 2,000 Units by mouth daily.     gabapentin (NEURONTIN) 300 MG capsule Take 300-600 mg by mouth See admin instructions. Taking 600 mg in am ;300 mg at lunch; 300 mg at hs.     levETIRAcetam (KEPPRA) 500 MG tablet Take 2 tablets (1,000 mg total) by mouth 2 (two) times daily. 120 tablet 3   lidocaine-prilocaine (EMLA) cream Apply 1 application topically as needed. 30 g 3   Magnesium 250 MG TABS Take 250 mg by mouth daily.     ondansetron (ZOFRAN) 8 MG tablet Take 1 tablet (8 mg total) by mouth every 8 (eight) hours as needed. 30 tablet 1   Probiotic Product (PROBIOTIC DAILY PO) Take 1 capsule by mouth daily.     prochlorperazine (COMPAZINE) 10 MG tablet Take 1 tablet (10 mg total) by mouth every 6 (six) hours as needed (Nausea or vomiting). 30 tablet 1   Sulfacetamide Sodium, Acne, 10 % LOTN Apply 1 application topically at bedtime.     VITAMIN E PO Take 50 Int'l Units/day by mouth daily.     No current facility-administered medications for this visit.    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 Findings highly suspicious for peritoneal carcinomatosis. Recommend paracentesis for therapeutic and diagnostic purposes. I do not see an obvious primary lesion but there is some irregular enhancing soft tissue in the right adnexal area. CA 125 level may be helpful. 2. Mild surface irregularity involving the liver but I do not see any obvious changes of cirrhosis. No hepatic lesions.   11/23/2018 Tumor Marker   Patient's tumor was tested for the following markers: CA-125 Results of the tumor marker test revealed 253.   11/25/2018 Initial Diagnosis    Peritoneal carcinoma (HCC)   11/25/2018 Imaging   US paracentesis Successful ultrasound-guided paracentesis yielding 3.4 L of peritoneal fluid.   12/01/2018 Cancer Staging   Staging form: Ovary, Fallopian Tube, and Primary Peritoneal Carcinoma, AJCC 8th Edition - Clinical stage from 12/01/2018: Stage IVA (rcT3, cN0, pM1a) - Signed by Artis Delay, MD on 02/14/2021 Stage prefix: Recurrence    12/02/2018 Tumor Marker   Patient's tumor was tested for the following markers: CA-125 Results of the tumor marker test revealed 267   12/06/2018 - 05/16/2019 Chemotherapy   The patient had carboplatin and taxol for chemotherapy treatment.     12/08/2018 Procedure   Successful ultrasound-guided therapeutic paracentesis yielding 1.7 liters of peritoneal fluid.   12/24/2018 Procedure   Successful placement of a right IJ approach Power Port with ultrasound and fluoroscopic guidance. The catheter is ready for use   01/17/2019 Tumor Marker   Patient's tumor was tested for the following markers: CA-125 Results of the tumor marker test revealed 57.2   01/31/2019 Imaging   Significant decrease in peritoneal carcinomatosis since previous study. Interval resolution of ascites.   Focal area of parenchymal consolidation in central right middle lobe, which measures 3 cm. Differential diagnosis includes infectious or inflammatory process, atelectasis, and neoplasm. Recommend short-term follow-up by chest CT in 2-3 months.     02/17/2019 Surgery   Surgeon: Quinn Axe  Pre-operative Diagnosis: primary peritoneal cancer stage IIIC    Operation: Robotic-assisted laparoscopic total hysterectomy with bilateral salpingoophorectomy, omentectomy, radical tumor debulking, minilaparotomy for omentectomy.   Surgeon: Quinn Axe    Operative Findings:  : grossly normal uterus, ovaries normal, few scattered peritoneal nodules (1mm) on serosa of uterus and tubes. The omentum (gastrocolic) was tethered to  the mesentery with tumor (thin rind). Complete (optimal) resection of tumor with no gross residual disease.     02/17/2019 Pathology Results   FINAL MICROSCOPIC DIAGNOSIS: A. UTERUS, CERVIX, BILATERAL FALLOPIAN TUBES AND OVARIES, HYSTERECTOMY WITH SALPINGOOOPHORECTOMY: - Uterus: Endometrium: Inactive endometrium. No hyperplasia or malignancy. Myometrium: Unremarkable. No malignancy. Serosa: Metastatic carcinoma. No malignancy. - Cervix: Benign squamous and endocervical mucosa. No dysplasia or malignancy. - Left ovary and fallopian tube: Metastatic carcinoma. - Right ovary: No malignancy identified. - Right fallopian tube: Luminal tumor, see comment. B. OMENTUM, RESECTION: - High grade serous carcinoma. - Deposits up to at least 3 cm. - See oncology table. ONCOLOGY TABLE: OVARY or FALLOPIAN TUBE or PRIMARY PERITONEUM: Procedure: Hysterectomy with bilateral salpingo-oophorectomy and omentectomy. Specimen Integrity: Intact Tumor Site: Peritoneum Ovarian Surface Involvement (required only if applicable): Left ovary Fallopian Tube Surface Involvement (required only if applicable): Left Fallopian tube Tumor Size: Largest deposit 3 cm Histologic Type: High-grade serous carcinoma Histologic Grade: High-grade Implants (required for advanced stage serous/seromucinous borderline tumors only): Uterine serosa, left fallopian tube and ovary, omentum Other Tissue/ Organ Involvement: As above Largest Extrapelvic Peritoneal Focus (required only if applicable): 3 cm Peritoneal/Ascitic Fluid: Pre neoadjuvant [id number] positive for carcinoma. Treatment Effect (required only for high-grade serous carcinomas): Probably partial treatment effect in omental tissue. Regional Lymph Nodes: No lymph nodes submitted or found Pathologic Stage Classification (pTNM, AJCC 8th Edition): ypT3c, ypNX Representative Tumor Block: B5 Comment(s): There is surface involvement of the left ovary and fallopian tube but no  primary tumor. Within the right fallopian tube lumen there is detached fragments of tumor but again no precursor lesion is noted in the right fallopian tube. Thus, the tumor is presumed primary peritoneal and staged as such.   03/21/2019 Tumor Marker   Patient's tumor was tested for the following markers: CA-125 Results of the tumor marker test revealed 15.1    Genetic Testing   Negative genetic testing. No pathogenic variants identified on the [facility] CancerNext + RNAinsight panel. VUS in ATM called [id number] identified. The report date is 05/12/2019. TumorNext HRD was originally ordered but there was not enough sample to complete this testing.   The CancerNext+RNAinsight gene panel offered by [employer] includes sequencing and rearrangement analysis for the following 36 genes: APC*, ATM*, AXIN2, BARD1, BMPR1A, BRCA1*, BRCA2*, BRIP1*, CDH1*, CDK4, CDKN2A, CHEK2*, DICER1, MLH1*, MSH2*, MSH3, MSH6*, MUTYH*, NBN, NF1*, NTHL1, PALB2*, PMS2*, PTEN*, RAD51C*, RAD51D*, RECQL, SMAD4, SMARCA4, STK11 and TP53* (sequencing and deletion/duplication); HOXB13, POLD1 and POLE (sequencing only); EPCAM and GREM1 (deletion/duplication only). DNA and RNA analyses performed for * genes.    05/16/2019 Tumor Marker   Patient's tumor was tested for the following markers: CA-125 Results of the tumor marker test revealed 10.7   06/16/2019 Imaging   1. No evidence of residual or recurrent metastatic disease in the chest, abdomen or pelvis. 2. Masslike focus of consolidation in the right middle lobe along the major and minor fissures with associated volume loss is stable since 01/31/2019. Indolent primary pulmonary neoplasm not excluded. PET-CT may be considered for further characterization. 3. Aortic Atherosclerosis (ICD10-I70.0).   06/16/2019 Tumor Marker   Patient's tumor was tested  for the following markers: CA-125 Results of the tumor marker test revealed 9.1.   12/05/2019 Tumor Marker   Patient's tumor was tested for  the following markers: CA-125. Results of the tumor marker test revealed 9.0.   02/29/2020 Tumor Marker   Patient's tumor was tested for the following markers: CA-`125 Results of the tumor marker test revealed 9.7.   05/28/2020 Tumor Marker   Patient's tumor was tested for the following markers: CA-125 Results of the tumor marker test revealed 9.5   08/16/2020 Tumor Marker   Patient's tumor was tested for the following markers: CA-125 Results of the tumor marker test revealed 10.9   11/29/2020 Tumor Marker   Patient's tumor was tested for the following markers: CA-125. Results of the tumor marker test revealed 10.2.   01/22/2021 Pathology Results   SURGICAL PATHOLOGY  CASE: [phone]  PATIENT: Concha Se  Surgical Pathology Report   Clinical History: brain metastasis (cm)    FINAL MICROSCOPIC DIAGNOSIS:   A. 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    03/20/2021 Imaging   1. Unchanged enlarged midline superior mediastinal and left retroperitoneal lymph nodes, which remain suspicious for metastatic disease. 2. Unchanged masslike consolidation and volume loss of the right middle lobe measuring 3.8 x 1.9 cm, likely chronic scarring. Continued attention on follow-up. 3. No new evidence of metastatic disease in the chest, abdomen, or pelvis. 4. Status post hysterectomy and omentectomy.   Aortic Atherosclerosis (ICD10-I70.0).     03/21/2021 - 05/07/2021 Chemotherapy   Patient is on Treatment Plan : OVARIAN RECURRENT 3RD LINE Carboplatin D1 / Gemcitabine D1,8 (4/800) q21d     05/17/2021 Imaging   1. No new suspicious mass or lymphadenopathy identified in the chest, abdomen or pelvis. 2. Interval decreased size of previous enlarged superior mediastinal and left retroperitoneal lymph nodes. 3. Other ancillary findings as described.   05/21/2021 -   Chemotherapy   Patient is on Treatment Plan : OVARIAN Gemcitabine D1,8 q21d        PHYSICAL EXAMINATION: ECOG PERFORMANCE STATUS: 2 - Symptomatic, <50% confined to bed  Vitals:   05/28/21 0835  BP: (!) 108/49  Pulse: 79  Resp: 18  Temp: 98.1 F (36.7 C)  SpO2: 100%   Filed Weights   05/28/21 0835  Weight: 105 lb 3.2 oz (47.7 kg)    GENERAL:alert, no distress and comfortable NEURO: alert & oriented x 3 with fluent speech, no focal motor/sensory deficits  LABORATORY DATA:  I have reviewed the data as listed    Component Value Date/Time   NA 140 05/28/2021 0819   K 3.6 05/28/2021 0819   CL 104 05/28/2021 0819   CO2 27 05/28/2021 0819   GLUCOSE 90 05/28/2021 0819   BUN 10 05/28/2021 0819   CREATININE 0.55 05/28/2021 0819   CALCIUM 9.1 05/28/2021 0819   PROT 6.6 05/28/2021 0819   ALBUMIN 4.0 05/28/2021 0819   AST 23 05/28/2021 0819   ALT 22 05/28/2021 0819   ALKPHOS 59 05/28/2021 0819   BILITOT 0.3 05/28/2021 0819   GFRNONAA >60 05/28/2021 0819   GFRAA >60 12/05/2019 1145    No results found for: SPEP, UPEP  Lab Results  Component Value Date   WBC 3.5 (L) 05/28/2021   NEUTROABS 2.0 05/28/2021   HGB 9.9 (L) 05/28/2021   HCT 29.4 (L) 05/28/2021   MCV 94.2 05/28/2021   PLT  315 05/28/2021      Chemistry      Component Value Date/Time   NA 140 05/28/2021 0819   K 3.6 05/28/2021 0819   CL 104 05/28/2021 0819   CO2 27 05/28/2021 0819   BUN 10 05/28/2021 0819   CREATININE 0.55 05/28/2021 0819      Component Value Date/Time   CALCIUM 9.1 05/28/2021 0819   ALKPHOS 59 05/28/2021 0819   AST 23 05/28/2021 0819   ALT 22 05/28/2021 0819   BILITOT 0.3 05/28/2021 6301

## 2021-05-28 NOTE — Assessment & Plan Note (Signed)
She has slight worsening pancytopenia likely due to side effects of treatment ?As above, after long discussion, she is in agreement to omit treatment today ?She does not need transfusion support ?

## 2021-06-03 DIAGNOSIS — C7931 Secondary malignant neoplasm of brain: Secondary | ICD-10-CM | POA: Diagnosis not present

## 2021-06-03 DIAGNOSIS — R030 Elevated blood-pressure reading, without diagnosis of hypertension: Secondary | ICD-10-CM | POA: Diagnosis not present

## 2021-06-11 ENCOUNTER — Inpatient Hospital Stay: Payer: Medicare Other

## 2021-06-11 ENCOUNTER — Other Ambulatory Visit: Payer: Self-pay

## 2021-06-11 VITALS — BP 125/68 | HR 89 | Resp 17 | Wt 104.5 lb

## 2021-06-11 DIAGNOSIS — C482 Malignant neoplasm of peritoneum, unspecified: Secondary | ICD-10-CM | POA: Diagnosis not present

## 2021-06-11 DIAGNOSIS — D6481 Anemia due to antineoplastic chemotherapy: Secondary | ICD-10-CM | POA: Diagnosis not present

## 2021-06-11 DIAGNOSIS — R5383 Other fatigue: Secondary | ICD-10-CM | POA: Diagnosis not present

## 2021-06-11 DIAGNOSIS — C7931 Secondary malignant neoplasm of brain: Secondary | ICD-10-CM | POA: Diagnosis not present

## 2021-06-11 DIAGNOSIS — Z5111 Encounter for antineoplastic chemotherapy: Secondary | ICD-10-CM | POA: Diagnosis not present

## 2021-06-11 DIAGNOSIS — T451X5D Adverse effect of antineoplastic and immunosuppressive drugs, subsequent encounter: Secondary | ICD-10-CM | POA: Diagnosis not present

## 2021-06-11 LAB — CBC WITH DIFFERENTIAL (CANCER CENTER ONLY)
Abs Immature Granulocytes: 0.01 10*3/uL (ref 0.00–0.07)
Basophils Absolute: 0 10*3/uL (ref 0.0–0.1)
Basophils Relative: 1 %
Eosinophils Absolute: 0.2 10*3/uL (ref 0.0–0.5)
Eosinophils Relative: 3 %
HCT: 32.9 % — ABNORMAL LOW (ref 36.0–46.0)
Hemoglobin: 11 g/dL — ABNORMAL LOW (ref 12.0–15.0)
Immature Granulocytes: 0 %
Lymphocytes Relative: 26 %
Lymphs Abs: 1.6 10*3/uL (ref 0.7–4.0)
MCH: 31.2 pg (ref 26.0–34.0)
MCHC: 33.4 g/dL (ref 30.0–36.0)
MCV: 93.2 fL (ref 80.0–100.0)
Monocytes Absolute: 0.6 10*3/uL (ref 0.1–1.0)
Monocytes Relative: 10 %
Neutro Abs: 3.5 10*3/uL (ref 1.7–7.7)
Neutrophils Relative %: 60 %
Platelet Count: 270 10*3/uL (ref 150–400)
RBC: 3.53 MIL/uL — ABNORMAL LOW (ref 3.87–5.11)
RDW: 15.6 % — ABNORMAL HIGH (ref 11.5–15.5)
WBC Count: 5.9 10*3/uL (ref 4.0–10.5)
nRBC: 0 % (ref 0.0–0.2)

## 2021-06-11 LAB — CMP (CANCER CENTER ONLY)
ALT: 16 U/L (ref 0–44)
AST: 24 U/L (ref 15–41)
Albumin: 4.1 g/dL (ref 3.5–5.0)
Alkaline Phosphatase: 64 U/L (ref 38–126)
Anion gap: 8 (ref 5–15)
BUN: 11 mg/dL (ref 8–23)
CO2: 29 mmol/L (ref 22–32)
Calcium: 9.4 mg/dL (ref 8.9–10.3)
Chloride: 102 mmol/L (ref 98–111)
Creatinine: 0.54 mg/dL (ref 0.44–1.00)
GFR, Estimated: >60 mL/min (ref >60)
Glucose, Bld: 86 mg/dL (ref 70–99)
Potassium: 3.8 mmol/L (ref 3.5–5.1)
Sodium: 139 mmol/L (ref 135–145)
Total Bilirubin: 0.3 mg/dL (ref 0.3–1.2)
Total Protein: 6.9 g/dL (ref 6.5–8.1)

## 2021-06-11 MED ORDER — SODIUM CHLORIDE 0.9% FLUSH
10.0000 mL | Freq: Once | INTRAVENOUS | Status: AC
  Administered 2021-06-11: 10 mL

## 2021-06-11 MED ORDER — PROCHLORPERAZINE MALEATE 10 MG PO TABS
10.0000 mg | ORAL_TABLET | Freq: Once | ORAL | Status: AC
  Administered 2021-06-11: 10 mg via ORAL
  Filled 2021-06-11: qty 1

## 2021-06-11 MED ORDER — SODIUM CHLORIDE 0.9% FLUSH
10.0000 mL | INTRAVENOUS | Status: DC | PRN
  Administered 2021-06-11: 10 mL

## 2021-06-11 MED ORDER — SODIUM CHLORIDE 0.9 % IV SOLN
800.0000 mg/m2 | Freq: Once | INTRAVENOUS | Status: AC
  Administered 2021-06-11: 1178 mg via INTRAVENOUS
  Filled 2021-06-11: qty 30.98

## 2021-06-11 MED ORDER — HEPARIN SOD (PORK) LOCK FLUSH 100 UNIT/ML IV SOLN
500.0000 [IU] | Freq: Once | INTRAVENOUS | Status: AC | PRN
  Administered 2021-06-11: 500 [IU]

## 2021-06-11 MED ORDER — SODIUM CHLORIDE 0.9 % IV SOLN: Freq: Once | INTRAVENOUS | Status: AC

## 2021-06-11 NOTE — Patient Instructions (Signed)
Gemcitabine injection ?What is this medication? ?GEMCITABINE (jem SYE ta been) is a chemotherapy drug. This medicine is used to treat many types of cancer like breast cancer, lung cancer, pancreatic cancer, and ovarian cancer. ?This medicine may be used for other purposes; ask your health care provider or pharmacist if you have questions. ?COMMON BRAND NAME(S): Gemzar, Infugem ?What should I tell my care team before I take this medication? ?They need to know if you have any of these conditions: ?blood disorders ?infection ?kidney disease ?liver disease ?lung or breathing disease, like asthma ?recent or ongoing radiation therapy ?an unusual or allergic reaction to gemcitabine, other chemotherapy, other medicines, foods, dyes, or preservatives ?pregnant or trying to get pregnant ?breast-feeding ?How should I use this medication? ?This drug is given as an infusion into a vein. It is administered in a hospital or clinic by a specially trained health care professional. ?Talk to your pediatrician regarding the use of this medicine in children. Special care may be needed. ?Overdosage: If you think you have taken too much of this medicine contact a poison control center or emergency room at once. ?NOTE: This medicine is only for you. Do not share this medicine with others. ?What if I miss a dose? ?It is important not to miss your dose. Call your doctor or health care professional if you are unable to keep an appointment. ?What may interact with this medication? ?medicines to increase blood counts like filgrastim, pegfilgrastim, sargramostim ?some other chemotherapy drugs like cisplatin ?vaccines ?Talk to your doctor or health care professional before taking any of these medicines: ?acetaminophen ?aspirin ?ibuprofen ?ketoprofen ?naproxen ?This list may not describe all possible interactions. Give your health care provider a list of all the medicines, herbs, non-prescription drugs, or dietary supplements you use. Also tell  them if you smoke, drink alcohol, or use illegal drugs. Some items may interact with your medicine. ?What should I watch for while using this medication? ?Visit your doctor for checks on your progress. This drug may make you feel generally unwell. This is not uncommon, as chemotherapy can affect healthy cells as well as cancer cells. Report any side effects. Continue your course of treatment even though you feel ill unless your doctor tells you to stop. ?In some cases, you may be given additional medicines to help with side effects. Follow all directions for their use. ?Call your doctor or health care professional for advice if you get a fever, chills or sore throat, or other symptoms of a cold or flu. Do not treat yourself. This drug decreases your body's ability to fight infections. Try to avoid being around people who are sick. ?This medicine may increase your risk to bruise or bleed. Call your doctor or health care professional if you notice any unusual bleeding. ?Be careful brushing and flossing your teeth or using a toothpick because you may get an infection or bleed more easily. If you have any dental work done, tell your dentist you are receiving this medicine. ?Avoid taking products that contain aspirin, acetaminophen, ibuprofen, naproxen, or ketoprofen unless instructed by your doctor. These medicines may hide a fever. ?Do not become pregnant while taking this medicine or for 6 months after stopping it. Women should inform their doctor if they wish to become pregnant or think they might be pregnant. Men should not father a child while taking this medicine and for 3 months after stopping it. There is a potential for serious side effects to an unborn child. Talk to your health care professional   or pharmacist for more information. Do not breast-feed an infant while taking this medicine or for at least 1 week after stopping it. ?Men should inform their doctors if they wish to father a child. This medicine may  lower sperm counts. Talk with your doctor or health care professional if you are concerned about your fertility. ?What side effects may I notice from receiving this medication? ?Side effects that you should report to your doctor or health care professional as soon as possible: ?allergic reactions like skin rash, itching or hives, swelling of the face, lips, or tongue ?breathing problems ?pain, redness, or irritation at site where injected ?signs and symptoms of a dangerous change in heartbeat or heart rhythm like chest pain; dizziness; fast or irregular heartbeat; palpitations; feeling faint or lightheaded, falls; breathing problems ?signs of decreased platelets or bleeding - bruising, pinpoint red spots on the skin, black, tarry stools, blood in the urine ?signs of decreased red blood cells - unusually weak or tired, feeling faint or lightheaded, falls ?signs of infection - fever or chills, cough, sore throat, pain or difficulty passing urine ?signs and symptoms of kidney injury like trouble passing urine or change in the amount of urine ?signs and symptoms of liver injury like dark yellow or brown urine; general ill feeling or flu-like symptoms; light-colored stools; loss of appetite; nausea; right upper belly pain; unusually weak or tired; yellowing of the eyes or skin ?swelling of ankles, feet, hands ?Side effects that usually do not require medical attention (report to your doctor or health care professional if they continue or are bothersome): ?constipation ?diarrhea ?hair loss ?loss of appetite ?nausea ?rash ?vomiting ?This list may not describe all possible side effects. Call your doctor for medical advice about side effects. You may report side effects to FDA at 1-800-FDA-1088. ?Where should I keep my medication? ?This drug is given in a hospital or clinic and will not be stored at home. ?NOTE: This sheet is a summary. It may not cover all possible information. If you have questions about this medicine,  talk to your doctor, pharmacist, or health care provider. ?? 2022 Elsevier/Gold Standard (2017-05-27 00:00:00) ? ?

## 2021-06-18 ENCOUNTER — Inpatient Hospital Stay: Payer: Medicare Other

## 2021-06-18 ENCOUNTER — Encounter: Payer: Self-pay | Admitting: Hematology and Oncology

## 2021-06-18 ENCOUNTER — Other Ambulatory Visit: Payer: Self-pay

## 2021-06-18 ENCOUNTER — Inpatient Hospital Stay: Payer: Medicare Other | Attending: Gynecologic Oncology | Admitting: Hematology and Oncology

## 2021-06-18 DIAGNOSIS — C482 Malignant neoplasm of peritoneum, unspecified: Secondary | ICD-10-CM | POA: Diagnosis not present

## 2021-06-18 DIAGNOSIS — Z5111 Encounter for antineoplastic chemotherapy: Secondary | ICD-10-CM | POA: Insufficient documentation

## 2021-06-18 DIAGNOSIS — D61818 Other pancytopenia: Secondary | ICD-10-CM | POA: Diagnosis not present

## 2021-06-18 DIAGNOSIS — D6481 Anemia due to antineoplastic chemotherapy: Secondary | ICD-10-CM | POA: Insufficient documentation

## 2021-06-18 DIAGNOSIS — R5381 Other malaise: Secondary | ICD-10-CM | POA: Diagnosis not present

## 2021-06-18 LAB — CBC WITH DIFFERENTIAL (CANCER CENTER ONLY)
Abs Immature Granulocytes: 0 10*3/uL (ref 0.00–0.07)
Basophils Absolute: 0 10*3/uL (ref 0.0–0.1)
Basophils Relative: 1 %
Eosinophils Absolute: 0 10*3/uL (ref 0.0–0.5)
Eosinophils Relative: 1 %
HCT: 29.9 % — ABNORMAL LOW (ref 36.0–46.0)
Hemoglobin: 9.9 g/dL — ABNORMAL LOW (ref 12.0–15.0)
Immature Granulocytes: 0 %
Lymphocytes Relative: 48 %
Lymphs Abs: 1.4 10*3/uL (ref 0.7–4.0)
MCH: 31.1 pg (ref 26.0–34.0)
MCHC: 33.1 g/dL (ref 30.0–36.0)
MCV: 94 fL (ref 80.0–100.0)
Monocytes Absolute: 0.3 10*3/uL (ref 0.1–1.0)
Monocytes Relative: 10 %
Neutro Abs: 1.2 10*3/uL — ABNORMAL LOW (ref 1.7–7.7)
Neutrophils Relative %: 40 %
Platelet Count: 201 10*3/uL (ref 150–400)
RBC: 3.18 MIL/uL — ABNORMAL LOW (ref 3.87–5.11)
RDW: 14.6 % (ref 11.5–15.5)
WBC Count: 2.9 10*3/uL — ABNORMAL LOW (ref 4.0–10.5)
nRBC: 0 % (ref 0.0–0.2)

## 2021-06-18 LAB — CMP (CANCER CENTER ONLY)
ALT: 16 U/L (ref 0–44)
AST: 23 U/L (ref 15–41)
Albumin: 4.1 g/dL (ref 3.5–5.0)
Alkaline Phosphatase: 61 U/L (ref 38–126)
Anion gap: 7 (ref 5–15)
BUN: 10 mg/dL (ref 8–23)
CO2: 29 mmol/L (ref 22–32)
Calcium: 9.2 mg/dL (ref 8.9–10.3)
Chloride: 101 mmol/L (ref 98–111)
Creatinine: 0.45 mg/dL (ref 0.44–1.00)
GFR, Estimated: >60 mL/min (ref >60)
Glucose, Bld: 90 mg/dL (ref 70–99)
Potassium: 3.7 mmol/L (ref 3.5–5.1)
Sodium: 137 mmol/L (ref 135–145)
Total Bilirubin: 0.3 mg/dL (ref 0.3–1.2)
Total Protein: 7.1 g/dL (ref 6.5–8.1)

## 2021-06-18 MED ORDER — SODIUM CHLORIDE 0.9% FLUSH
10.0000 mL | Freq: Once | INTRAVENOUS | Status: AC
  Administered 2021-06-18: 10 mL

## 2021-06-18 NOTE — Progress Notes (Signed)
[name] ?OFFICE PROGRESS NOTE ? ?Patient Care Team: ?Orpah Melter, MD as PCP - General (Family Medicine) ?Sueanne Margarita, MD as PCP - Cardiology (Cardiology) ?Awanda Mink Craige Cotta, RN as Oncology Nurse Navigator (Oncology) ? ?ASSESSMENT & PLAN:  ?Peritoneal carcinoma ([place]) ?She has difficulties tolerating single agent gemcitabine ?The patient felt bad ?She documented excessive fatigue, difficulties walking, bony aches predominantly in the legs, tendon/ligament pain, poor appetite with sensation of sour stomach, overall weakness and spent most of the time lying down or sitting down ?We reviewed her blood count today ?She has developed pancytopenia from treatment ?After very long discussion, we are in agreement to cancel her treatment today to allow additional time for recovery from treatment ?Moving forward, when she returns for her next cycle of treatment, my plan would be to change and modify her treatment plan; she will receive slight dose reduction of gemcitabine but the plan will be to keep her treatment at day 1 and day 8 and rest day 15 for cycle of every 21 days ?We will repeat imaging study again in a few months for further follow-up ? ?Pancytopenia, acquired ([place]) ?She has slight worsening pancytopenia likely due to side effects of treatment ?As above, after long discussion, she is in agreement to omit treatment today ?She does not need transfusion support ?We also have extensive discussions about dosage modification in the future ?We discussed the risk and benefits of adding additional week of rest versus continue on same treatment cycle but with reduced dose ?Ultimately, we have made informed decision to proceed with reduced dose for future cycle of treatment ? ?Physical debility ?She has profound overall debility from side effects of treatment and is not having great quality of life ?After much discussion, we are in agreement to omit treatment today to allow further recovery ? ?No orders  of the defined types were placed in this encounter. ? ? ?All questions were answered. The patient knows to call the clinic with any problems, questions or concerns. ?The total time spent in the appointment was 40 minutes encounter with patients including review of chart and various tests results, discussions about plan of care and coordination of care plan ?  ?[name], MD ?06/18/2021 1:41 PM ? ?INTERVAL HISTORY: ?Please see below for problem oriented charting. ?she returns for treatment follow-up seen prior to day 8 gemcitabine ?The patient documented significant side effects since last treatment on the piece of paper which I summarized above ?She denies recent bleeding ?No recent infection ?No new neurological deficits ? ?REVIEW OF SYSTEMS:   ?All other systems were reviewed with the patient and are negative. ? ?I have reviewed the past medical history, past surgical history, social history and family history with the patient and they are unchanged from previous note. ? ?ALLERGIES:  is allergic to carboplatin, fentanyl, vancomycin, lac bovis, brimonidine tartrate, ditropan [oxybutynin], milk-related compounds, other, oxybutynin chloride, thimerosal, tramadol, avelox [moxifloxacin hcl in nacl], doxycycline, erythromycin, metronidazole, minocycline, penicillins, quinolones, and sulfonamide derivatives. ? ?MEDICATIONS:  ?Current Outpatient Medications  ?Medication Sig Dispense Refill  ? Azelaic Acid 15 % cream Apply 1 application topically daily.    ? cholecalciferol (VITAMIN D3) 25 MCG (1000 UNIT) tablet Take 2,000 Units by mouth daily.    ? gabapentin (NEURONTIN) 300 MG capsule Take 300-600 mg by mouth See admin instructions. Taking 600 mg in am ;300 mg at lunch; 300 mg at hs.    ? levETIRAcetam (KEPPRA) 500 MG tablet Take 2 tablets (1,000 mg total) by mouth  2 (two) times daily. 120 tablet 3  ? lidocaine-prilocaine (EMLA) cream Apply 1 application topically as needed. 30 g 3  ? Magnesium 250 MG TABS Take 250 mg by  mouth daily.    ? ondansetron (ZOFRAN) 8 MG tablet Take 1 tablet (8 mg total) by mouth every 8 (eight) hours as needed. 30 tablet 1  ? prochlorperazine (COMPAZINE) 10 MG tablet Take 1 tablet (10 mg total) by mouth every 6 (six) hours as needed (Nausea or vomiting). 30 tablet 1  ? Sulfacetamide Sodium, Acne, 10 % LOTN Apply 1 application topically at bedtime.    ? VITAMIN E PO Take 50 Int'l Units/day by mouth daily.    ? ?No current facility-administered medications for this visit.  ? ? 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 Ct abdomen and pelvis ?1. Findings highly suspicious for peritoneal carcinomatosis. Recommend paracentesis for therapeutic and diagnostic purposes. I do not see an obvious primary lesion but there is some irregular enhancing soft tissue in the right adnexal area. CA 125 level may be helpful. ?2. Mild surface irregularity involving the liver but I do not see any obvious changes of cirrhosis. No hepatic lesions. ?  ?11/23/2018 Tumor Marker  ? Patient's tumor was tested for the following markers: CA-125 ?Results of the tumor marker test revealed 253. ?  ?11/25/2018 Initial Diagnosis  ? Peritoneal carcinoma [facility]) ?  ?11/25/2018 Imaging  ? US paracentesis ?Successful ultrasound-guided paracentesis yielding 3.4 L of peritoneal fluid. ?  ?12/01/2018 Cancer Staging  ? Staging form: Ovary, Fallopian Tube, and Primary Peritoneal Carcinoma, AJCC 8th Edition ?- Clinical stage from 12/01/2018: Stage IVA (rcT3, cN0, pM1a) - Signed by [name], MD on 02/14/2021 ?Stage prefix: Recurrence ? ?  ?12/02/2018 Tumor Marker  ? Patient's tumor was tested for the following markers: CA-125 ?Results of the tumor marker test revealed 267 ?  ?12/06/2018 - 05/16/2019 Chemotherapy  ? The patient had carboplatin and taxol for chemotherapy treatment.   ? ?  ?12/08/2018 Procedure  ? Successful ultrasound-guided therapeutic paracentesis yielding 1.7 liters of peritoneal fluid. ?  ?12/24/2018 Procedure  ? Successful placement of a right IJ approach Power Port with ultrasound and fluoroscopic guidance. The catheter is ready for use ?  ?01/17/2019 Tumor Marker  ? Patient's tumor was tested for the following markers: CA-125 ?Results of the tumor marker test revealed 57.2 ?  ?01/31/2019 Imaging  ? Significant decrease in peritoneal carcinomatosis since previous study. Interval resolution of ascites. ?  ?Focal area of parenchymal consolidation in central right middle lobe, which measures 3 cm. Differential diagnosis includes infectious or inflammatory process, atelectasis, and neoplasm. Recommend short-term follow-up by chest CT in 2-3 months. ?  ?  ?02/17/2019 Surgery  ? Surgeon: Donaciano Eva  ?   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   ?Operation: Robotic-assisted laparoscopic total hysterectomy with bilateral salpingoophorectomy, omentectomy, radical tumor debulking, minilaparotomy for omentectomy. ?  ?Surgeon: Donaciano Eva ?   ?Operative Findings:  : grossly normal uterus, ovaries normal, few scattered peritoneal nodules (49m) on serosa of uterus and tubes. The omentum (gastrocolic) was tethered to the mesentery with tumor (thin rind). Complete (optimal) resection of tumor with no gross residual disease. ?  ?  ?02/17/2019 Pathology Results  ? FINAL MICROSCOPIC DIAGNOSIS: ?A. UTERUS, CERVIX, BILATERAL FALLOPIAN TUBES AND OVARIES, HYSTERECTOMY WITH  SALPINGOOOPHORECTOMY: ?- Uterus: ?Endometrium: Inactive endometrium. No hyperplasia or malignancy. ?Myometrium: Unremarkable. No malignancy. ?Serosa: Metastatic carcinoma. No malignancy. ?- Cervix: Benign squamous and endocervical mucosa. No dysplasia or malignancy. ?- Left ovary and fallopian tube: Metastatic carcinoma. ?- Right ovary: No malignancy identified. ?- Right fallopian tube:  Luminal tumor, see comment. ?B. OMENTUM, RESECTION: ?- High grade serous carcinoma. ?- Deposits up to at least 3 cm. ?- See oncology table. ?ONCOLOGY TABLE: ?OVARY or FALLOPIAN TUBE or PRIMARY PERITONE

## 2021-06-18 NOTE — Assessment & Plan Note (Signed)
She has difficulties tolerating single agent gemcitabine ?The patient felt bad ?She documented excessive fatigue, difficulties walking, bony aches predominantly in the legs, tendon/ligament pain, poor appetite with sensation of sour stomach, overall weakness and spent most of the time lying down or sitting down ?We reviewed her blood count today ?She has developed pancytopenia from treatment ?After very long discussion, we are in agreement to cancel her treatment today to allow additional time for recovery from treatment ?Moving forward, when she returns for her next cycle of treatment, my plan would be to change and modify her treatment plan; she will receive slight dose reduction of gemcitabine but the plan will be to keep her treatment at day 1 and day 8 and rest day 15 for cycle of every 21 days ?We will repeat imaging study again in a few months for further follow-up ?

## 2021-06-18 NOTE — Assessment & Plan Note (Signed)
She has profound overall debility from side effects of treatment and is not having great quality of life ?After much discussion, we are in agreement to omit treatment today to allow further recovery ?

## 2021-06-18 NOTE — Assessment & Plan Note (Signed)
She has slight worsening pancytopenia likely due to side effects of treatment ?As above, after long discussion, she is in agreement to omit treatment today ?She does not need transfusion support ?We also have extensive discussions about dosage modification in the future ?We discussed the risk and benefits of adding additional week of rest versus continue on same treatment cycle but with reduced dose ?Ultimately, we have made informed decision to proceed with reduced dose for future cycle of treatment ?

## 2021-06-19 ENCOUNTER — Other Ambulatory Visit: Payer: Self-pay | Admitting: Hematology and Oncology

## 2021-06-19 ENCOUNTER — Telehealth: Payer: Self-pay

## 2021-06-19 MED ORDER — PANTOPRAZOLE SODIUM 40 MG PO TBEC: 40.0000 mg | DELAYED_RELEASE_TABLET | Freq: Every day | ORAL | 3 refills | Status: DC

## 2021-06-19 NOTE — Telephone Encounter (Signed)
Pt called amd asks that Dr Alvy Bimler will send in another RX for Pantoprazole as her old rx from 2 years ago is expired. Pt asks for a 30 day supply and that it is sent to [facility] in [place]. Advised pt I would make MD aware of her request.  ?

## 2021-06-20 ENCOUNTER — Other Ambulatory Visit: Payer: Self-pay | Admitting: Hematology and Oncology

## 2021-06-26 ENCOUNTER — Encounter: Payer: Self-pay | Admitting: Hematology and Oncology

## 2021-07-02 ENCOUNTER — Other Ambulatory Visit: Payer: Self-pay

## 2021-07-02 ENCOUNTER — Inpatient Hospital Stay: Payer: Medicare Other

## 2021-07-02 ENCOUNTER — Encounter: Payer: Self-pay | Admitting: Hematology and Oncology

## 2021-07-02 ENCOUNTER — Inpatient Hospital Stay (HOSPITAL_BASED_OUTPATIENT_CLINIC_OR_DEPARTMENT_OTHER): Payer: Medicare Other | Admitting: Hematology and Oncology

## 2021-07-02 DIAGNOSIS — D6481 Anemia due to antineoplastic chemotherapy: Secondary | ICD-10-CM

## 2021-07-02 DIAGNOSIS — Z7189 Other specified counseling: Secondary | ICD-10-CM

## 2021-07-02 DIAGNOSIS — C482 Malignant neoplasm of peritoneum, unspecified: Secondary | ICD-10-CM | POA: Diagnosis not present

## 2021-07-02 DIAGNOSIS — Z5111 Encounter for antineoplastic chemotherapy: Secondary | ICD-10-CM | POA: Diagnosis not present

## 2021-07-02 DIAGNOSIS — D61818 Other pancytopenia: Secondary | ICD-10-CM | POA: Diagnosis not present

## 2021-07-02 DIAGNOSIS — T451X5A Adverse effect of antineoplastic and immunosuppressive drugs, initial encounter: Secondary | ICD-10-CM

## 2021-07-02 DIAGNOSIS — R5381 Other malaise: Secondary | ICD-10-CM | POA: Diagnosis not present

## 2021-07-02 LAB — CMP (CANCER CENTER ONLY)
ALT: 12 U/L (ref 0–44)
AST: 19 U/L (ref 15–41)
Albumin: 4.2 g/dL (ref 3.5–5.0)
Alkaline Phosphatase: 56 U/L (ref 38–126)
Anion gap: 6 (ref 5–15)
BUN: 8 mg/dL (ref 8–23)
CO2: 30 mmol/L (ref 22–32)
Calcium: 9.4 mg/dL (ref 8.9–10.3)
Chloride: 104 mmol/L (ref 98–111)
Creatinine: 0.52 mg/dL (ref 0.44–1.00)
GFR, Estimated: >60 mL/min (ref >60)
Glucose, Bld: 86 mg/dL (ref 70–99)
Potassium: 3.8 mmol/L (ref 3.5–5.1)
Sodium: 140 mmol/L (ref 135–145)
Total Bilirubin: 0.4 mg/dL (ref 0.3–1.2)
Total Protein: 7 g/dL (ref 6.5–8.1)

## 2021-07-02 LAB — CBC WITH DIFFERENTIAL (CANCER CENTER ONLY)
Abs Immature Granulocytes: 0.01 10*3/uL (ref 0.00–0.07)
Basophils Absolute: 0 10*3/uL (ref 0.0–0.1)
Basophils Relative: 1 %
Eosinophils Absolute: 0.1 10*3/uL (ref 0.0–0.5)
Eosinophils Relative: 2 %
HCT: 32.3 % — ABNORMAL LOW (ref 36.0–46.0)
Hemoglobin: 10.8 g/dL — ABNORMAL LOW (ref 12.0–15.0)
Immature Granulocytes: 0 %
Lymphocytes Relative: 36 %
Lymphs Abs: 1.8 10*3/uL (ref 0.7–4.0)
MCH: 31.9 pg (ref 26.0–34.0)
MCHC: 33.4 g/dL (ref 30.0–36.0)
MCV: 95.3 fL (ref 80.0–100.0)
Monocytes Absolute: 0.5 10*3/uL (ref 0.1–1.0)
Monocytes Relative: 10 %
Neutro Abs: 2.5 10*3/uL (ref 1.7–7.7)
Neutrophils Relative %: 51 %
Platelet Count: 285 10*3/uL (ref 150–400)
RBC: 3.39 MIL/uL — ABNORMAL LOW (ref 3.87–5.11)
RDW: 14.6 % (ref 11.5–15.5)
WBC Count: 5 10*3/uL (ref 4.0–10.5)
nRBC: 0 % (ref 0.0–0.2)

## 2021-07-02 MED ORDER — HEPARIN SOD (PORK) LOCK FLUSH 100 UNIT/ML IV SOLN
500.0000 [IU] | Freq: Once | INTRAVENOUS | Status: AC | PRN
  Administered 2021-07-02: 500 [IU]

## 2021-07-02 MED ORDER — SODIUM CHLORIDE 0.9% FLUSH
10.0000 mL | INTRAVENOUS | Status: DC | PRN
  Administered 2021-07-02: 10 mL

## 2021-07-02 MED ORDER — SODIUM CHLORIDE 0.9 % IV SOLN: Freq: Once | INTRAVENOUS | Status: AC

## 2021-07-02 MED ORDER — PROCHLORPERAZINE MALEATE 10 MG PO TABS
10.0000 mg | ORAL_TABLET | Freq: Once | ORAL | Status: AC
  Administered 2021-07-02: 10 mg via ORAL
  Filled 2021-07-02: qty 1

## 2021-07-02 MED ORDER — SODIUM CHLORIDE 0.9 % IV SOLN
650.0000 mg/m2 | Freq: Once | INTRAVENOUS | Status: AC
  Administered 2021-07-02: 950 mg via INTRAVENOUS
  Filled 2021-07-02: qty 24.98

## 2021-07-02 MED ORDER — SODIUM CHLORIDE 0.9% FLUSH
10.0000 mL | Freq: Once | INTRAVENOUS | Status: AC
  Administered 2021-07-02: 10 mL

## 2021-07-02 NOTE — Assessment & Plan Note (Signed)
This is likely due to recent treatment. The patient denies recent history of bleeding such as epistaxis, hematuria or hematochezia.  I will observe for now.   ?

## 2021-07-02 NOTE — Assessment & Plan Note (Signed)
We went through our discussion from previous visit ?Her leukopenia has improved ?She remains anemic and somewhat symptomatic but is willing to proceed with treatment with dose adjustment ? ?

## 2021-07-02 NOTE — Assessment & Plan Note (Signed)
We had discussion about goals of care ?The patient wants to try with reduced dose chemotherapy before stopping ?

## 2021-07-02 NOTE — Patient Instructions (Signed)
[facility]  Discharge Instructions: ?Thank you for choosing [facility] to provide your oncology and hematology care.  ? ?If you have a lab appointment with the Cancer Center, please go directly to the Cancer Center and check in at the registration area. ?  ?Wear comfortable clothing and clothing appropriate for easy access to any Portacath or PICC line.  ? ?We strive to give you quality time with your provider. You may need to reschedule your appointment if you arrive late (15 or more minutes).  Arriving late affects you and other patients whose appointments are after yours.  Also, if you miss three or more appointments without notifying the office, you may be dismissed from the clinic at the provider?s discretion.    ?  ?For prescription refill requests, have your pharmacy contact our office and allow 72 hours for refills to be completed.   ? ?Today you received the following chemotherapy and/or immunotherapy agent: Gemzar    ?  ?To help prevent nausea and vomiting after your treatment, we encourage you to take your nausea medication as directed. ? ?BELOW ARE SYMPTOMS THAT SHOULD BE REPORTED IMMEDIATELY: ?*FEVER GREATER THAN 100.4 F (38 ?C) OR HIGHER ?*CHILLS OR SWEATING ?*NAUSEA AND VOMITING THAT IS NOT CONTROLLED WITH YOUR NAUSEA MEDICATION ?*UNUSUAL SHORTNESS OF BREATH ?*UNUSUAL BRUISING OR BLEEDING ?*URINARY PROBLEMS (pain or burning when urinating, or frequent urination) ?*BOWEL PROBLEMS (unusual diarrhea, constipation, pain near the anus) ?TENDERNESS IN MOUTH AND THROAT WITH OR WITHOUT PRESENCE OF ULCERS (sore throat, sores in mouth, or a toothache) ?UNUSUAL RASH, SWELLING OR PAIN  ?UNUSUAL VAGINAL DISCHARGE OR ITCHING  ? ?Items with * indicate a potential emergency and should be followed up as soon as possible or go to the Emergency Department if any problems should occur. ? ?Please show the CHEMOTHERAPY ALERT CARD or IMMUNOTHERAPY ALERT CARD at check-in to the  Emergency Department and triage nurse. ? ?Should you have questions after your visit or need to cancel or reschedule your appointment, please contact [facility]  Dept: [phone]  and follow the prompts.  Office hours are 8:00 a.m. to 4:30 p.m. Monday - Friday. Please note that voicemails left after 4:00 p.m. may not be returned until the following business day.  We are closed weekends and major holidays. You have access to a nurse at all times for urgent questions. Please call the main number to the clinic Dept: [phone] and follow the prompts. ? ? ?For any non-urgent questions, you may also contact your provider using MyChart. We now offer e-Visits for anyone 18 and older to request care online for non-urgent symptoms. For details visit [email or web address]. ?  ?Also download the MyChart app! Go to the app store, search "MyChart", open the app, select [facility], and log in with your MyChart username and password. ? ?Due to Covid, a mask is required upon entering the hospital/clinic. If you do not have a mask, one will be given to you upon arrival. For doctor visits, patients may have 1 support person aged 18 or older with them. For treatment visits, patients cannot have anyone with them due to current Covid guidelines and our immunocompromised population.  ? ?

## 2021-07-02 NOTE — Progress Notes (Signed)
[place] ?OFFICE PROGRESS NOTE ? ?Patient Care Team: ?Orpah Melter, MD as PCP - General (Family Medicine) ?Sueanne Margarita, MD as PCP - Cardiology (Cardiology) ?Awanda Mink Craige Cotta, RN as Oncology Nurse Navigator (Oncology) ? 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 ? ?Anemia due to antineoplastic chemotherapy ?This is likely due to recent treatment. The patient denies recent history of bleeding such as epistaxis, hematuria or hematochezia.  I will observe for now.   ? ?Goals of care, counseling/discussion ?We had discussion about goals of care ?The patient wants to try with reduced dose chemotherapy before stopping ? ?No orders of the defined types were placed in this encounter. ? ? ?All questions were answered. The patient knows to call the clinic with any problems, questions or concerns. ?The total time spent in the appointment was 20 minutes encounter with patients including review of chart and various tests results, discussions about plan of care and coordination of care plan ?  ?[name], MD ?07/02/2021 1:47 PM ? ?INTERVAL HISTORY: ?Please see below for problem oriented charting. ?she returns for treatment follow-up ?Her energy level has improved somewhat but she still complains of fatigue ?No recent bleeding ? ?REVIEW OF SYSTEMS:   ?Constitutional: Denies fevers, chills or abnormal weight loss ?Eyes: Denies blurriness of vision ?Ears, nose, mouth, throat, and face: Denies mucositis or sore throat ?Respiratory: Denies cough, dyspnea or wheezes ?Cardiovascular: Denies palpitation, chest discomfort or lower extremity swelling ?Gastrointestinal:  Denies nausea, heartburn or change in bowel habits ?Skin: Denies abnormal skin rashes ?Lymphatics: Denies new lymphadenopathy or easy  bruising ?Neurological:Denies numbness, tingling or new weaknesses ?Behavioral/Psych: Mood is stable, no new changes  ?All other systems were reviewed with the patient and are negative. ? ?I have reviewed the past medical history, past surgical history, social history and family history with the patient and they are unchanged from previous note. ? ?ALLERGIES:  is allergic to carboplatin, fentanyl, vancomycin, lac bovis, brimonidine tartrate, ditropan [oxybutynin], milk-related compounds, other, oxybutynin chloride, thimerosal, tramadol, avelox [moxifloxacin hcl in nacl], doxycycline, erythromycin, metronidazole, minocycline, penicillins, quinolones, and sulfonamide derivatives. ? ?MEDICATIONS:  ?Current Outpatient Medications  ?Medication Sig Dispense Refill  ? Azelaic Acid 15 % cream Apply 1 application topically daily.    ? cholecalciferol (VITAMIN D3) 25 MCG (1000 UNIT) tablet Take 2,000 Units by mouth daily.    ? gabapentin (NEURONTIN) 300 MG capsule Take 300-600 mg by mouth See admin instructions. Taking 600 mg in am ;300 mg at lunch; 300 mg at hs.    ? levETIRAcetam (KEPPRA) 500 MG tablet Take 2 tablets (1,000 mg total) by mouth 2 (two) times daily. 120 tablet 3  ? lidocaine-prilocaine (EMLA) cream Apply 1 application topically as needed. 30 g 3  ? Magnesium 250 MG TABS Take 250 mg by mouth daily.    ? ondansetron (ZOFRAN) 8 MG tablet Take 1 tablet (8 mg total) by mouth every 8 (eight) hours as needed. 30 tablet 1  ? pantoprazole (PROTONIX) 40 MG tablet Take 1 tablet (40 mg total) by mouth daily. 30 tablet 3  ? prochlorperazine (COMPAZINE) 10 MG tablet Take 1 tablet (10 mg total) by mouth every 6 (six) hours as needed (Nausea or vomiting). 30 tablet 1  ? Sulfacetamide Sodium, Acne, 10 % LOTN Apply 1 application topically at bedtime.    ? VITAMIN E  PO Take 50 Int'l Units/day by mouth daily.    ? ?No current facility-administered medications for this visit.  ? ?Facility-Administered Medications Ordered in Other  Visits  ?Medication Dose Route Frequency Provider Last Rate Last Admin  ? sodium chloride flush (NS) 0.9 % injection 10 mL  10 mL Intracatheter PRN Alvy Bimler, Nathaneal Sommers, MD   10 mL at 07/02/21 1345  ? ? 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  ?Peritoneal carcinoma ([place])  ?11/23/2018 Imaging  ? Ct abdomen and pelvis ?1. Findings highly suspicious for peritoneal carcinomatosis. Recommend paracentesis for therapeutic and diagnostic purposes. I do not see an obvious primary lesion but there is some irregular enhancing soft tissue in the right adnexal area. CA 125 level may be helpful. ?2. Mild surface irregularity involving the liver but I do not see any obvious changes of cirrhosis. No hepatic lesions. ?  ?11/23/2018 Tumor Marker  ? Patient's tumor was tested for the following markers: CA-125 ?Results of the tumor marker test revealed 253. ?  ?11/25/2018 Initial Diagnosis  ? Peritoneal carcinoma ([place]) ? ?  ?11/25/2018 Imaging  ? US paracentesis ?Successful ultrasound-guided paracentesis yielding 3.4 L of peritoneal fluid. ?  ?12/01/2018 Cancer Staging  ? Staging form: Ovary, Fallopian Tube, and Primary Peritoneal Carcinoma, AJCC 8th Edition ?- Clinical stage from 12/01/2018: Stage IVA (rcT3, cN0, pM1a) - Signed by [name], MD on 02/14/2021 ?Stage prefix: Recurrence ? ?  ?12/02/2018 Tumor Marker  ? Patient's tumor was tested for the following markers: CA-125 ?Results of the tumor marker test revealed 267 ?  ?12/06/2018 - 05/16/2019 Chemotherapy  ? The patient had carboplatin and taxol for chemotherapy treatment.  ? ?  ?12/08/2018 Procedure  ? Successful ultrasound-guided therapeutic paracentesis yielding 1.7 liters of peritoneal fluid. ?  ?12/24/2018 Procedure  ? Successful placement of a right IJ approach Power Port with ultrasound and fluoroscopic guidance. The catheter is ready for use ?  ?01/17/2019  Tumor Marker  ? Patient's tumor was tested for the following markers: CA-125 ?Results of the tumor marker test revealed 57.2 ?  ?01/31/2019 Imaging  ? Significant decrease in peritoneal carcinomatosis since previous study. Interval resolution of ascites. ?  ?Focal area of parenchymal consolidation in central right middle lobe, which measures 3 cm. Differential diagnosis includes infectious or inflammatory process, atelectasis, and neoplasm. Recommend short-term follow-up by chest CT in 2-3 months. ?  ?  ?02/17/2019 Surgery  ? Surgeon: Donaciano Eva  ?   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   ?Operation: Robotic-assisted laparoscopic total hysterectomy with bilateral salpingoophorectomy, omentectomy, radical tumor debulking, minilaparotomy for omentectomy. ?  ?Surgeon: Donaciano Eva ?   ?Operative Findings:  : grossly normal uterus, ovaries normal, few scattered peritoneal nodules (55m) on serosa of uterus and tubes. The omentum (gastrocolic) was tethered to the mesentery with tumor (thin rind). Complete (optimal) resection of tumor with no gross residual disease. ?  ?  ?02/17/2019 Pathology Results  ? FINAL MICROSCOPIC DIAGNOSIS: ?A. UTERUS, CERVIX, BILATERAL FALLOPIAN TUBES AND OVARIES, HYSTERECTOMY WITH SALPINGOOOPHORECTOMY: ?- Uterus: ?Endometrium: Inactive endometrium. No hyperplasia or malignancy. ?Myometrium: Unremarkable. No malignancy. ?Serosa: Metastatic carcinoma. No malignancy. ?- Cervix: Benign squamous and endocervical mucosa. No dysplasia or malignancy. ?- Left ovary and fallopian tube: Metastatic carcinoma. ?- Right ovary: No malignancy identified. ?- Right fallopian tube: Luminal tumor, see comment. ?B. OMENTUM, RESECTION: ?- High grade serous carcinoma. ?-  Deposits up to at least 3 cm. ?- See oncology table. ?ONCOLOGY TABLE: ?OVARY or FALLOPIAN TUBE or PRIMARY PERITONEUM: ?Procedure: Hysterectomy with bilateral salpingo-oophorectomy and omentectomy. ?Specimen  Integrity: Intact ?Tumor Site: Peritoneum ?Ovarian Surface Involvement (required only if applicable): Left ovary ?Fallopian Tube Surface Involvement (required only if applicable): Left Fallopian tube ?Tumor Size: Largest deposi

## 2021-07-04 DIAGNOSIS — H35372 Puckering of macula, left eye: Secondary | ICD-10-CM | POA: Diagnosis not present

## 2021-07-04 DIAGNOSIS — H43813 Vitreous degeneration, bilateral: Secondary | ICD-10-CM | POA: Diagnosis not present

## 2021-07-04 DIAGNOSIS — H2513 Age-related nuclear cataract, bilateral: Secondary | ICD-10-CM | POA: Diagnosis not present

## 2021-07-04 DIAGNOSIS — H40033 Anatomical narrow angle, bilateral: Secondary | ICD-10-CM | POA: Diagnosis not present

## 2021-07-04 DIAGNOSIS — H401421 Capsular glaucoma with pseudoexfoliation of lens, left eye, mild stage: Secondary | ICD-10-CM | POA: Diagnosis not present

## 2021-07-09 ENCOUNTER — Other Ambulatory Visit: Payer: Self-pay

## 2021-07-09 ENCOUNTER — Inpatient Hospital Stay: Payer: Medicare Other

## 2021-07-09 ENCOUNTER — Encounter: Payer: Self-pay | Admitting: Hematology and Oncology

## 2021-07-09 ENCOUNTER — Inpatient Hospital Stay (HOSPITAL_BASED_OUTPATIENT_CLINIC_OR_DEPARTMENT_OTHER): Payer: Medicare Other | Admitting: Hematology and Oncology

## 2021-07-09 DIAGNOSIS — D6481 Anemia due to antineoplastic chemotherapy: Secondary | ICD-10-CM | POA: Diagnosis not present

## 2021-07-09 DIAGNOSIS — M898X9 Other specified disorders of bone, unspecified site: Secondary | ICD-10-CM

## 2021-07-09 DIAGNOSIS — C482 Malignant neoplasm of peritoneum, unspecified: Secondary | ICD-10-CM

## 2021-07-09 DIAGNOSIS — D61818 Other pancytopenia: Secondary | ICD-10-CM

## 2021-07-09 DIAGNOSIS — Z5111 Encounter for antineoplastic chemotherapy: Secondary | ICD-10-CM | POA: Diagnosis not present

## 2021-07-09 DIAGNOSIS — R5381 Other malaise: Secondary | ICD-10-CM | POA: Diagnosis not present

## 2021-07-09 LAB — CBC WITH DIFFERENTIAL (CANCER CENTER ONLY)
Abs Immature Granulocytes: 0.01 10*3/uL (ref 0.00–0.07)
Basophils Absolute: 0 10*3/uL (ref 0.0–0.1)
Basophils Relative: 1 %
Eosinophils Absolute: 0 10*3/uL (ref 0.0–0.5)
Eosinophils Relative: 1 %
HCT: 29.9 % — ABNORMAL LOW (ref 36.0–46.0)
Hemoglobin: 10 g/dL — ABNORMAL LOW (ref 12.0–15.0)
Immature Granulocytes: 0 %
Lymphocytes Relative: 37 %
Lymphs Abs: 1.4 10*3/uL (ref 0.7–4.0)
MCH: 31.9 pg (ref 26.0–34.0)
MCHC: 33.4 g/dL (ref 30.0–36.0)
MCV: 95.5 fL (ref 80.0–100.0)
Monocytes Absolute: 0.3 10*3/uL (ref 0.1–1.0)
Monocytes Relative: 8 %
Neutro Abs: 1.9 10*3/uL (ref 1.7–7.7)
Neutrophils Relative %: 53 %
Platelet Count: 229 10*3/uL (ref 150–400)
RBC: 3.13 MIL/uL — ABNORMAL LOW (ref 3.87–5.11)
RDW: 13.4 % (ref 11.5–15.5)
WBC Count: 3.6 10*3/uL — ABNORMAL LOW (ref 4.0–10.5)
nRBC: 0 % (ref 0.0–0.2)

## 2021-07-09 LAB — CMP (CANCER CENTER ONLY)
ALT: 17 U/L (ref 0–44)
AST: 21 U/L (ref 15–41)
Albumin: 4.1 g/dL (ref 3.5–5.0)
Alkaline Phosphatase: 67 U/L (ref 38–126)
Anion gap: 7 (ref 5–15)
BUN: 9 mg/dL (ref 8–23)
CO2: 29 mmol/L (ref 22–32)
Calcium: 9.4 mg/dL (ref 8.9–10.3)
Chloride: 102 mmol/L (ref 98–111)
Creatinine: 0.46 mg/dL (ref 0.44–1.00)
GFR, Estimated: >60 mL/min (ref >60)
Glucose, Bld: 98 mg/dL (ref 70–99)
Potassium: 3.7 mmol/L (ref 3.5–5.1)
Sodium: 138 mmol/L (ref 135–145)
Total Bilirubin: 0.3 mg/dL (ref 0.3–1.2)
Total Protein: 7 g/dL (ref 6.5–8.1)

## 2021-07-09 LAB — TOTAL PROTEIN, URINE DIPSTICK: Protein, ur: NEGATIVE mg/dL

## 2021-07-09 MED ORDER — SODIUM CHLORIDE 0.9% FLUSH
10.0000 mL | INTRAVENOUS | Status: DC | PRN
  Administered 2021-07-09: 10 mL

## 2021-07-09 MED ORDER — SODIUM CHLORIDE 0.9 % IV SOLN: Freq: Once | INTRAVENOUS | Status: AC

## 2021-07-09 MED ORDER — SODIUM CHLORIDE 0.9% FLUSH
10.0000 mL | Freq: Once | INTRAVENOUS | Status: AC
  Administered 2021-07-09: 10 mL

## 2021-07-09 MED ORDER — PROCHLORPERAZINE MALEATE 10 MG PO TABS
10.0000 mg | ORAL_TABLET | Freq: Once | ORAL | Status: AC
  Administered 2021-07-09: 10 mg via ORAL
  Filled 2021-07-09: qty 1

## 2021-07-09 MED ORDER — SODIUM CHLORIDE 0.9 % IV SOLN
650.0000 mg/m2 | Freq: Once | INTRAVENOUS | Status: AC
  Administered 2021-07-09: 950 mg via INTRAVENOUS
  Filled 2021-07-09: qty 24.98

## 2021-07-09 MED ORDER — HEPARIN SOD (PORK) LOCK FLUSH 100 UNIT/ML IV SOLN
500.0000 [IU] | Freq: Once | INTRAVENOUS | Status: AC | PRN
  Administered 2021-07-09: 500 [IU]

## 2021-07-09 NOTE — Assessment & Plan Note (Signed)
She has stable pancytopenia likely due to side effects of treatment ?We will monitor closely and proceed with treatment without delay ?

## 2021-07-09 NOTE — Assessment & Plan Note (Signed)
She has intermittent bone pain after treatment ?We discussed prescription pain medicine to take as needed ?The patient felt it is not necessary at this point ?

## 2021-07-09 NOTE — Patient Instructions (Signed)
Gemcitabine injection ?What is this medication? ?GEMCITABINE (jem SYE ta been) is a chemotherapy drug. This medicine is used to treat many types of cancer like breast cancer, lung cancer, pancreatic cancer, and ovarian cancer. ?This medicine may be used for other purposes; ask your health care provider or pharmacist if you have questions. ?COMMON BRAND NAME(S): Gemzar, Infugem ?What should I tell my care team before I take this medication? ?They need to know if you have any of these conditions: ?blood disorders ?infection ?kidney disease ?liver disease ?lung or breathing disease, like asthma ?recent or ongoing radiation therapy ?an unusual or allergic reaction to gemcitabine, other chemotherapy, other medicines, foods, dyes, or preservatives ?pregnant or trying to get pregnant ?breast-feeding ?How should I use this medication? ?This drug is given as an infusion into a vein. It is administered in a hospital or clinic by a specially trained health care professional. ?Talk to your pediatrician regarding the use of this medicine in children. Special care may be needed. ?Overdosage: If you think you have taken too much of this medicine contact a poison control center or emergency room at once. ?NOTE: This medicine is only for you. Do not share this medicine with others. ?What if I miss a dose? ?It is important not to miss your dose. Call your doctor or health care professional if you are unable to keep an appointment. ?What may interact with this medication? ?medicines to increase blood counts like filgrastim, pegfilgrastim, sargramostim ?some other chemotherapy drugs like cisplatin ?vaccines ?Talk to your doctor or health care professional before taking any of these medicines: ?acetaminophen ?aspirin ?ibuprofen ?ketoprofen ?naproxen ?This list may not describe all possible interactions. Give your health care provider a list of all the medicines, herbs, non-prescription drugs, or dietary supplements you use. Also tell  them if you smoke, drink alcohol, or use illegal drugs. Some items may interact with your medicine. ?What should I watch for while using this medication? ?Visit your doctor for checks on your progress. This drug may make you feel generally unwell. This is not uncommon, as chemotherapy can affect healthy cells as well as cancer cells. Report any side effects. Continue your course of treatment even though you feel ill unless your doctor tells you to stop. ?In some cases, you may be given additional medicines to help with side effects. Follow all directions for their use. ?Call your doctor or health care professional for advice if you get a fever, chills or sore throat, or other symptoms of a cold or flu. Do not treat yourself. This drug decreases your body's ability to fight infections. Try to avoid being around people who are sick. ?This medicine may increase your risk to bruise or bleed. Call your doctor or health care professional if you notice any unusual bleeding. ?Be careful brushing and flossing your teeth or using a toothpick because you may get an infection or bleed more easily. If you have any dental work done, tell your dentist you are receiving this medicine. ?Avoid taking products that contain aspirin, acetaminophen, ibuprofen, naproxen, or ketoprofen unless instructed by your doctor. These medicines may hide a fever. ?Do not become pregnant while taking this medicine or for 6 months after stopping it. Women should inform their doctor if they wish to become pregnant or think they might be pregnant. Men should not father a child while taking this medicine and for 3 months after stopping it. There is a potential for serious side effects to an unborn child. Talk to your health care professional   or pharmacist for more information. Do not breast-feed an infant while taking this medicine or for at least 1 week after stopping it. ?Men should inform their doctors if they wish to father a child. This medicine may  lower sperm counts. Talk with your doctor or health care professional if you are concerned about your fertility. ?What side effects may I notice from receiving this medication? ?Side effects that you should report to your doctor or health care professional as soon as possible: ?allergic reactions like skin rash, itching or hives, swelling of the face, lips, or tongue ?breathing problems ?pain, redness, or irritation at site where injected ?signs and symptoms of a dangerous change in heartbeat or heart rhythm like chest pain; dizziness; fast or irregular heartbeat; palpitations; feeling faint or lightheaded, falls; breathing problems ?signs of decreased platelets or bleeding - bruising, pinpoint red spots on the skin, black, tarry stools, blood in the urine ?signs of decreased red blood cells - unusually weak or tired, feeling faint or lightheaded, falls ?signs of infection - fever or chills, cough, sore throat, pain or difficulty passing urine ?signs and symptoms of kidney injury like trouble passing urine or change in the amount of urine ?signs and symptoms of liver injury like dark yellow or brown urine; general ill feeling or flu-like symptoms; light-colored stools; loss of appetite; nausea; right upper belly pain; unusually weak or tired; yellowing of the eyes or skin ?swelling of ankles, feet, hands ?Side effects that usually do not require medical attention (report to your doctor or health care professional if they continue or are bothersome): ?constipation ?diarrhea ?hair loss ?loss of appetite ?nausea ?rash ?vomiting ?This list may not describe all possible side effects. Call your doctor for medical advice about side effects. You may report side effects to FDA at 1-800-FDA-1088. ?Where should I keep my medication? ?This drug is given in a hospital or clinic and will not be stored at home. ?NOTE: This sheet is a summary. It may not cover all possible information. If you have questions about this medicine,  talk to your doctor, pharmacist, or health care provider. ?? 2022 Elsevier/Gold Standard (2017-05-27 00:00:00) ? ?

## 2021-07-09 NOTE — Progress Notes (Signed)
[place] ?OFFICE PROGRESS NOTE ? ?Patient Care Team: ?Orpah Melter, MD as PCP - General (Family Medicine) ?Sueanne Margarita, MD as PCP - Cardiology (Cardiology) ?Awanda Mink Craige Cotta, RN as Oncology Nurse Navigator (Oncology) ? ?ASSESSMENT & PLAN:  ?Peritoneal carcinoma ([place]) ?We went through our discussion from previous visit ?Since recent dose changes, she has persistent pancytopenia but not worse than before ?She has similar symptoms with each cycle of treatment including feeling weak, anorexic with loss of appetite for several days and extreme bone pain but according to the patient, this is tolerable ?We will continue current treatment as scheduled ?I plan to repeat imaging study again in June for further follow-up ? ? ?Pancytopenia, acquired ([place]) ?She has stable pancytopenia likely due to side effects of treatment ?We will monitor closely and proceed with treatment without delay ? ?Bone pain ?She has intermittent bone pain after treatment ?We discussed prescription pain medicine to take as needed ?The patient felt it is not necessary at this point ? ?No orders of the defined types were placed in this encounter. ? ? ?All questions were answered. The patient knows to call the clinic with any problems, questions or concerns. ?The total time spent in the appointment was 20 minutes encounter with patients including review of chart and various tests results, discussions about plan of care and coordination of care plan ?  ?[name], MD ?07/09/2021 10:49 AM ? ?INTERVAL HISTORY: ?Please see below for problem oriented charting. ?she returns for treatment follow-up prior to chemotherapy ?She stated that the weight fluctuated with our weighing scale ?She had loss of appetite/anorexic and weak for few days after chemotherapy ?She also have extreme bone pain after chemotherapy but that has resolved ?No recent bleeding ?No recent infection ? ?REVIEW OF SYSTEMS:   ?Constitutional: Denies fevers, chills or  abnormal weight loss ?Ears, nose, mouth, throat, and face: Denies mucositis or sore throat ?Respiratory: Denies cough, dyspnea or wheezes ?Cardiovascular: Denies palpitation, chest discomfort or lower extremity swelling ?Gastrointestinal:  Denies nausea, heartburn or change in bowel habits ?Skin: Denies abnormal skin rashes ?Lymphatics: Denies new lymphadenopathy or easy bruising ?Behavioral/Psych: Mood is stable, no new changes  ?All other systems were reviewed with the patient and are negative. ? ?I have reviewed the past medical history, past surgical history, social history and family history with the patient and they are unchanged from previous note. ? ?ALLERGIES:  is allergic to carboplatin, fentanyl, vancomycin, lac bovis, brimonidine tartrate, ditropan [oxybutynin], milk-related compounds, other, oxybutynin chloride, thimerosal, tramadol, avelox [moxifloxacin hcl in nacl], doxycycline, erythromycin, metronidazole, minocycline, penicillins, quinolones, and sulfonamide derivatives. ? ?MEDICATIONS:  ?Current Outpatient Medications  ?Medication Sig Dispense Refill  ? Azelaic Acid 15 % cream Apply 1 application topically daily.    ? cholecalciferol (VITAMIN D3) 25 MCG (1000 UNIT) tablet Take 2,000 Units by mouth daily.    ? gabapentin (NEURONTIN) 300 MG capsule Take 300-600 mg by mouth See admin instructions. Taking 600 mg in am ;300 mg at lunch; 300 mg at hs.    ? levETIRAcetam (KEPPRA) 500 MG tablet Take 2 tablets (1,000 mg total) by mouth 2 (two) times daily. 120 tablet 3  ? lidocaine-prilocaine (EMLA) cream Apply 1 application topically as needed. 30 g 3  ? Magnesium 250 MG TABS Take 250 mg by mouth daily.    ? ondansetron (ZOFRAN) 8 MG tablet Take 1 tablet (8 mg total) by mouth every 8 (eight) hours as needed. 30 tablet 1  ? pantoprazole (PROTONIX) 40 MG tablet Take  1 tablet (40 mg total) by mouth daily. 30 tablet 3  ? prochlorperazine (COMPAZINE) 10 MG tablet Take 1 tablet (10 mg total) by mouth every 6  (six) hours as needed (Nausea or vomiting). 30 tablet 1  ? Sulfacetamide Sodium, Acne, 10 % LOTN Apply 1 application topically at bedtime.    ? VITAMIN E PO Take 50 Int'l Units/day by mouth daily.    ? ?No current facility-administered medications for this visit.  ? ?Facility-Administered Medications Ordered in Other Visits  ?Medication Dose Route Frequency Provider Last Rate Last Admin  ? sodium chloride flush (NS) 0.9 % injection 10 mL  10 mL Intracatheter PRN [name], MD   10 mL at 07/09/21 1028  ? ? 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  ?Peritoneal carcinoma ([place])  ?11/23/2018 Imaging  ? Ct abdomen and pelvis ?1. Findings highly suspicious for peritoneal carcinomatosis. Recommend paracentesis for therapeutic and diagnostic purposes. I do not see an obvious primary lesion but there is some irregular enhancing soft tissue in the right adnexal area. CA 125 level may be helpful. ?2. Mild surface irregularity involving the liver but I do not see any obvious changes of cirrhosis. No hepatic lesions. ?  ?11/23/2018 Tumor Marker  ? Patient's tumor was tested for the following markers: CA-125 ?Results of the tumor marker test revealed 253. ?  ?11/25/2018 Initial Diagnosis  ? Peritoneal carcinoma ([place]) ? ?  ?11/25/2018 Imaging  ? US paracentesis ?Successful ultrasound-guided paracentesis yielding 3.4 L of peritoneal fluid. ?  ?12/01/2018 Cancer Staging  ? Staging form: Ovary, Fallopian Tube, and Primary Peritoneal Carcinoma, AJCC 8th Edition ?- Clinical stage from 12/01/2018: Stage IVA (rcT3, cN0, pM1a) - Signed by [name], MD on 02/14/2021 ?Stage prefix: Recurrence ? ?  ?12/02/2018 Tumor Marker  ? Patient's tumor was tested for the following markers: CA-125 ?Results of the tumor marker test revealed 267 ?  ?12/06/2018 - 05/16/2019 Chemotherapy  ? The patient had carboplatin and taxol for  chemotherapy treatment.  ? ?  ?12/08/2018 Procedure  ? Successful ultrasound-guided therapeutic paracentesis yielding 1.7 liters of peritoneal fluid. ?  ?12/24/2018 Procedure  ? Successful placement of a right IJ approach Power Port with ultrasound and fluoroscopic guidance. The catheter is ready for use ?  ?01/17/2019 Tumor Marker  ? Patient's tumor was tested for the following markers: CA-125 ?Results of the tumor marker test revealed 57.2 ?  ?01/31/2019 Imaging  ? Significant decrease in peritoneal carcinomatosis since previous study. Interval resolution of ascites. ?  ?Focal area of parenchymal consolidation in central right middle lobe, which measures 3 cm. Differential diagnosis includes infectious or inflammatory process, atelectasis, and neoplasm. Recommend short-term follow-up by chest CT in 2-3 months. ?  ?  ?02/17/2019 Surgery  ? Surgeon: Donaciano Eva  ?   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   ?Operation: Robotic-assisted laparoscopic total hysterectomy with bilateral salpingoophorectomy, omentectomy, radical tumor debulking, minilaparotomy for omentectomy. ?  ?Surgeon: Donaciano Eva ?   ?Operative Findings:  : grossly normal uterus, ovaries normal, few scattered peritoneal nodules (53m) on serosa of uterus and tubes. The omentum (gastrocolic) was tethered to the mesentery with tumor (thin rind). Complete (optimal) resection of tumor with no gross residual disease. ?  ?  ?02/17/2019 Pathology Results  ? FINAL MICROSCOPIC DIAGNOSIS: ?A. UTERUS, CERVIX, BILATERAL FALLOPIAN TUBES AND OVARIES, HYSTERECTOMY WITH SALPINGOOOPHORECTOMY: ?- Uterus: ?  Endometrium: Inactive endometrium. No hyperplasia or malignancy. ?Myometrium: Unremarkable. No malignancy. ?Serosa: Metastatic carcinoma. No malignancy. ?- Cervix: Benign squamous and endocervical mucosa. No dysplasia or malignancy. ?- Left ovary and fallopian tube: Metastatic carcinoma. ?- Right ovary: No malignancy  identified. ?- Right fallopian tube: Luminal tumor, see comment. ?B. OMENTUM, RESECTION: ?- High grade serous carcinoma. ?- Deposits up to at least 3 cm. ?- See oncology table. ?ONCOLOGY TABLE: ?OVARY or FALLOPIAN TUBE or P

## 2021-07-09 NOTE — Assessment & Plan Note (Signed)
We went through our discussion from previous visit ?Since recent dose changes, she has persistent pancytopenia but not worse than before ?She has similar symptoms with each cycle of treatment including feeling weak, anorexic with loss of appetite for several days and extreme bone pain but according to the patient, this is tolerable ?We will continue current treatment as scheduled ?I plan to repeat imaging study again in June for further follow-up ? ?

## 2021-07-23 ENCOUNTER — Encounter: Payer: Self-pay | Admitting: Hematology and Oncology

## 2021-07-23 ENCOUNTER — Inpatient Hospital Stay: Payer: Medicare Other

## 2021-07-23 ENCOUNTER — Other Ambulatory Visit: Payer: Self-pay

## 2021-07-23 ENCOUNTER — Inpatient Hospital Stay: Payer: Medicare Other | Attending: Gynecologic Oncology

## 2021-07-23 ENCOUNTER — Inpatient Hospital Stay (HOSPITAL_BASED_OUTPATIENT_CLINIC_OR_DEPARTMENT_OTHER): Payer: Medicare Other | Admitting: Hematology and Oncology

## 2021-07-23 VITALS — BP 122/44 | HR 72 | Temp 97.6°F | Resp 16 | Ht 65.0 in | Wt 103.4 lb

## 2021-07-23 DIAGNOSIS — Z5111 Encounter for antineoplastic chemotherapy: Secondary | ICD-10-CM | POA: Insufficient documentation

## 2021-07-23 DIAGNOSIS — D6481 Anemia due to antineoplastic chemotherapy: Secondary | ICD-10-CM | POA: Insufficient documentation

## 2021-07-23 DIAGNOSIS — C482 Malignant neoplasm of peritoneum, unspecified: Secondary | ICD-10-CM

## 2021-07-23 DIAGNOSIS — Z452 Encounter for adjustment and management of vascular access device: Secondary | ICD-10-CM | POA: Diagnosis not present

## 2021-07-23 DIAGNOSIS — C7931 Secondary malignant neoplasm of brain: Secondary | ICD-10-CM

## 2021-07-23 DIAGNOSIS — D61818 Other pancytopenia: Secondary | ICD-10-CM | POA: Diagnosis not present

## 2021-07-23 LAB — CMP (CANCER CENTER ONLY)
ALT: 15 U/L (ref 0–44)
AST: 21 U/L (ref 15–41)
Albumin: 4.3 g/dL (ref 3.5–5.0)
Alkaline Phosphatase: 66 U/L (ref 38–126)
Anion gap: 10 (ref 5–15)
BUN: 11 mg/dL (ref 8–23)
CO2: 28 mmol/L (ref 22–32)
Calcium: 9.3 mg/dL (ref 8.9–10.3)
Chloride: 100 mmol/L (ref 98–111)
Creatinine: 0.51 mg/dL (ref 0.44–1.00)
GFR, Estimated: >60 mL/min (ref >60)
Glucose, Bld: 90 mg/dL (ref 70–99)
Potassium: 3.7 mmol/L (ref 3.5–5.1)
Sodium: 138 mmol/L (ref 135–145)
Total Bilirubin: 0.7 mg/dL (ref 0.3–1.2)
Total Protein: 7.5 g/dL (ref 6.5–8.1)

## 2021-07-23 LAB — CBC WITH DIFFERENTIAL (CANCER CENTER ONLY)
Abs Immature Granulocytes: 0.01 10*3/uL (ref 0.00–0.07)
Basophils Absolute: 0 10*3/uL (ref 0.0–0.1)
Basophils Relative: 0 %
Eosinophils Absolute: 0.1 10*3/uL (ref 0.0–0.5)
Eosinophils Relative: 1 %
HCT: 31.6 % — ABNORMAL LOW (ref 36.0–46.0)
Hemoglobin: 10.5 g/dL — ABNORMAL LOW (ref 12.0–15.0)
Immature Granulocytes: 0 %
Lymphocytes Relative: 21 %
Lymphs Abs: 1.3 10*3/uL (ref 0.7–4.0)
MCH: 31.7 pg (ref 26.0–34.0)
MCHC: 33.2 g/dL (ref 30.0–36.0)
MCV: 95.5 fL (ref 80.0–100.0)
Monocytes Absolute: 0.5 10*3/uL (ref 0.1–1.0)
Monocytes Relative: 9 %
Neutro Abs: 4.2 10*3/uL (ref 1.7–7.7)
Neutrophils Relative %: 69 %
Platelet Count: 412 10*3/uL — ABNORMAL HIGH (ref 150–400)
RBC: 3.31 MIL/uL — ABNORMAL LOW (ref 3.87–5.11)
RDW: 13.8 % (ref 11.5–15.5)
WBC Count: 6.1 10*3/uL (ref 4.0–10.5)
nRBC: 0 % (ref 0.0–0.2)

## 2021-07-23 MED ORDER — PROCHLORPERAZINE MALEATE 10 MG PO TABS
10.0000 mg | ORAL_TABLET | Freq: Once | ORAL | Status: AC
  Administered 2021-07-23: 10 mg via ORAL
  Filled 2021-07-23: qty 1

## 2021-07-23 MED ORDER — HEPARIN SOD (PORK) LOCK FLUSH 100 UNIT/ML IV SOLN
500.0000 [IU] | Freq: Once | INTRAVENOUS | Status: AC | PRN
  Administered 2021-07-23: 500 [IU]

## 2021-07-23 MED ORDER — SODIUM CHLORIDE 0.9 % IV SOLN: Freq: Once | INTRAVENOUS | Status: AC

## 2021-07-23 MED ORDER — SODIUM CHLORIDE 0.9% FLUSH
10.0000 mL | INTRAVENOUS | Status: DC | PRN
  Administered 2021-07-23: 10 mL

## 2021-07-23 MED ORDER — SODIUM CHLORIDE 0.9 % IV SOLN
650.0000 mg/m2 | Freq: Once | INTRAVENOUS | Status: AC
  Administered 2021-07-23: 950 mg via INTRAVENOUS
  Filled 2021-07-23: qty 24.98

## 2021-07-23 MED ORDER — SODIUM CHLORIDE 0.9% FLUSH
10.0000 mL | Freq: Once | INTRAVENOUS | Status: AC
  Administered 2021-07-23: 10 mL

## 2021-07-23 NOTE — Patient Instructions (Signed)
[facility]COL[facility]   ?Discharge Instructions: ?Thank you for choosing [facility] to provide your oncology and hematology care.  ? ?If you have a lab appointment with the Cancer Center, please go directly to the Cancer Center and check in at the registration area. ?  ?Wear comfortable clothing and clothing appropriate for easy access to any Portacath or PICC line.  ? ?We strive to give you quality time with your provider. You may need to reschedule your appointment if you arrive late (15 or more minutes).  Arriving late affects you and other patients whose appointments are after yours.  Also, if you miss three or more appointments without notifying the office, you may be dismissed from the clinic at the provider?s discretion.    ?  ?For prescription refill requests, have your pharmacy contact our office and allow 72 hours for refills to be completed.   ? ?Today you received the following chemotherapy and/or immunotherapy agents: gemcitabine    ?  ?To help prevent nausea and vomiting after your treatment, we encourage you to take your nausea medication as directed. ? ?BELOW ARE SYMPTOMS THAT SHOULD BE REPORTED IMMEDIATELY: ?*FEVER GREATER THAN 100.4 F (38 ?C) OR HIGHER ?*CHILLS OR SWEATING ?*NAUSEA AND VOMITING THAT IS NOT CONTROLLED WITH YOUR NAUSEA MEDICATION ?*UNUSUAL SHORTNESS OF BREATH ?*UNUSUAL BRUISING OR BLEEDING ?*URINARY PROBLEMS (pain or burning when urinating, or frequent urination) ?*BOWEL PROBLEMS (unusual diarrhea, constipation, pain near the anus) ?TENDERNESS IN MOUTH AND THROAT WITH OR WITHOUT PRESENCE OF ULCERS (sore throat, sores in mouth, or a toothache) ?UNUSUAL RASH, SWELLING OR PAIN  ?UNUSUAL VAGINAL DISCHARGE OR ITCHING  ? ?Items with * indicate a potential emergency and should be followed up as soon as possible or go to the Emergency Department if any problems should occur. ? ?Please show the CHEMOTHERAPY ALERT CARD or IMMUNOTHERAPY ALERT CARD at check-in  to the Emergency Department and triage nurse. ? ?Should you have questions after your visit or need to cancel or reschedule your appointment, please contact [facility]  Dept: [phone]  and follow the prompts.  Office hours are 8:00 a.m. to 4:30 p.m. Monday - Friday. Please note that voicemails left after 4:00 p.m. may not be returned until the following business day.  We are closed weekends and major holidays. You have access to a nurse at all times for urgent questions. Please call the main number to the clinic Dept: [phone] and follow the prompts. ? ? ?For any non-urgent questions, you may also contact your provider using MyChart. We now offer e-Visits for anyone 18 and older to request care online for non-urgent symptoms. For details visit [email or web address]. ?  ?Also download the MyChart app! Go to the app store, search "MyChart", open the app, select , and log in with your MyChart username and password. ? ?Due to Covid, a mask is required upon entering the hospital/clinic. If you do not have a mask, one will be given to you upon arrival. For doctor visits, patients may have 1 support person aged 18 or older with them. For treatment visits, patients cannot have anyone with them due to current Covid guidelines and our immunocompromised population.  ? ?

## 2021-07-23 NOTE — Assessment & Plan Note (Signed)
She has multiple appointment and brain imaging scheduled and follow-up with neuro oncologist ?She has experienced some balance difficulties but no recent falls ?She has no new neurological deficits ?I will defer to neuro oncologist for further management ?

## 2021-07-23 NOTE — Progress Notes (Signed)
[name] ?OFFICE PROGRESS NOTE ? ?Patient Care Team: ?Orpah Melter, MD as PCP - General (Family Medicine) ?Sueanne Margarita, MD as PCP - Cardiology (Cardiology) ?Awanda Mink Craige Cotta, RN as Oncology Nurse Navigator (Oncology) ? 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 ? ?Pancytopenia, acquired ([name]) ?She has stable pancytopenia likely due to side effects of treatment ?We will monitor closely and proceed with treatment without delay ? 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 ?Orders Placed This Encounter  ?Procedures  ? CT CHEST ABDOMEN PELVIS W CONTRAST  ?  Standing Status:   Future  ?  Standing Expiration Date:   07/24/2022  ?  Order Specific Question:   Preferred imaging location?  ?  Answer:   [facility]  ?  Order Specific Question:   Radiology Contrast Protocol - do NOT remove file path  ?  Answer:   [email or web address]  ? ? ?All questions were answered. The patient knows to call the clinic with any problems, questions or concerns. ?The total time spent in the appointment was 20 minutes encounter with patients including review of chart and various tests results, discussions about plan of care and coordination of care plan ?  ?[name] Lark, MD ?07/23/2021 9:42  AM ? ?INTERVAL HISTORY: ?Please see below for problem oriented charting. ?she returns for treatment follow-up ?She returns prior to cycle 1 of treatment ?She complains of slightly off balance at times but no recent falls ?With each cycle of treatment, she continues to have similar side effects but tolerable ? ?REVIEW OF SYSTEMS:   ?Constitutional: Denies fevers, chills or abnormal weight loss ?Ears, nose, mouth, throat, and face: Denies mucositis or sore throat ?Respiratory: Denies cough, dyspnea or wheezes ?Cardiovascular: Denies palpitation, chest discomfort or lower extremity swelling ?Gastrointestinal:  Denies nausea, heartburn or change in bowel habits ?Skin: Denies abnormal skin rashes ?Lymphatics: Denies new lymphadenopathy or easy bruising ?Behavioral/Psych: Mood is stable, no new changes  ?All other systems were reviewed with the patient and are negative. ? ?I have reviewed the past medical history, past surgical history, social history and family history with the patient and they are unchanged from previous note. ? ?ALLERGIES:  is allergic to carboplatin, fentanyl, vancomycin, lac bovis, brimonidine tartrate, ditropan [oxybutynin], milk-related compounds, other, oxybutynin chloride, thimerosal, tramadol, avelox [moxifloxacin hcl in nacl], doxycycline, erythromycin, metronidazole, minocycline, penicillins, quinolones, and sulfonamide derivatives. ? ?MEDICATIONS:  ?Current Outpatient Medications  ?Medication Sig Dispense Refill  ? Azelaic Acid 15 % cream Apply 1 application topically daily.    ? cholecalciferol (VITAMIN D3) 25 MCG (1000 UNIT) tablet Take 2,000 Units by mouth daily.    ? gabapentin (NEURONTIN) 300 MG capsule Take 300-600 mg by mouth See admin instructions. Taking 600 mg in am ;300 mg at lunch; 300 mg at hs.    ? levETIRAcetam (KEPPRA) 500 MG tablet Take 2 tablets (1,000 mg total)  by mouth 2 (two) times daily. 120 tablet 3  ? lidocaine-prilocaine (EMLA) cream Apply 1 application topically as  needed. 30 g 3  ? Magnesium 250 MG TABS Take 250 mg by mouth daily.    ? ondansetron (ZOFRAN) 8 MG tablet Take 1 tablet (8 mg total) by mouth every 8 (eight) hours as needed. 30 tablet 1  ? pantoprazole (PROTONIX) 40 MG tablet Take 1 tablet (40 mg total) by mouth daily. 30 tablet 3  ? prochlorperazine (COMPAZINE) 10 MG tablet Take 1 tablet (10 mg total) by mouth every 6 (six) hours as needed (Nausea or vomiting). 30 tablet 1  ? Sulfacetamide Sodium, Acne, 10 % LOTN Apply 1 application topically at bedtime.    ? VITAMIN E PO Take 50 Int'l Units/day by mouth daily.    ? ?No current facility-administered medications for this visit.  ? ? 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 Ct abdomen and pelvis ?1. Findings highly suspicious for peritoneal carcinomatosis. Recommend paracentesis for therapeutic and diagnostic purposes. I do not see an obvious primary lesion but there is some irregular enhancing soft tissue in the right adnexal area. CA 125 level may be helpful. ?2. Mild surface irregularity involving the liver but I do not see any obvious changes of cirrhosis. No hepatic lesions. ?  ?11/23/2018 Tumor Marker  ? Patient's tumor was tested for the following markers: CA-125 ?Results of the tumor marker test revealed 253. ?  ?11/25/2018 Initial Diagnosis  ? 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 ?  ?11/25/2018 Imaging  ? US paracentesis ?Successful ultrasound-guided paracentesis yielding 3.4 L of peritoneal fluid. ?  ?12/01/2018 Cancer Staging  ? Staging form: Ovary, Fallopian Tube, and Primary Peritoneal Carcinoma, AJCC 8th Edition ?- Clinical stage from 12/01/2018: Stage IVA (rcT3, cN0, pM1a) - Signed by [name] Lark, MD on 02/14/2021 ?Stage prefix: Recurrence ? ?  ?12/02/2018 Tumor Marker  ? Patient's tumor was tested for the following markers:  CA-125 ?Results of the tumor marker test revealed 267 ?  ?12/06/2018 - 05/16/2019 Chemotherapy  ? The patient had carboplatin and taxol for chemotherapy treatment.  ? ?  ?12/08/2018 Procedure  ? Successful ultrasound-guided therapeutic paracentesis yielding 1.7 liters of peritoneal fluid. ?  ?12/24/2018 Procedure  ? Successful placement of a right IJ approach Power Port with ultrasound and fluoroscopic guidance. The catheter is ready for use ?  ?01/17/2019 Tumor Marker  ? Patient's tumor was tested for the following markers: CA-125 ?Results of the tumor marker test revealed 57.2 ?  ?01/31/2019 Imaging  ? Significant decrease in peritoneal carcinomatosis since previous study. Interval resolution of ascites. ?  ?Focal area of parenchymal consolidation in central right middle lobe, which measures 3 cm. Differential diagnosis includes infectious or inflammatory process, atelectasis, and neoplasm. Recommend short-term follow-up by chest CT in 2-3 months. ?  ?  ?02/17/2019 Surgery  ? Surgeon: Donaciano Eva  ?   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   ?Operation: Robotic-assisted laparoscopic total hysterectomy with bilateral salpingoophorectomy, omentectomy, radical tumor debulking, minilaparotomy for omentectomy. ?  ?Surgeon: Donaciano Eva ?   ?Operative Findings:  : grossly normal uterus, ovaries normal, few scattered peritoneal nodules (52m) on serosa of uterus and tubes. The omentum (gastrocolic) was tethered to the mesentery with tumor (thin rind). Complete (optimal) resection of tumor with no gross residual disease. ?  ?  ?  02/17/2019 Pathology Results  ? FINAL MICROSCOPIC DIAGNOSIS: ?A. UTERUS, CERVIX, BILATERAL FALLOPIAN TUBES AND OVARIES, HYSTERECTOMY WITH SALPINGOOOPHORECTOMY: ?- Uterus: ?Endometrium: Inactive endometrium. No hyperplasia or malignancy. ?Myometrium: Unremarkable. No malignancy. ?Serosa: Metastatic carcinoma. No malignancy. ?- Cervix: Benign squamous and  endocervical mucosa. No dysplasia or malignancy. ?- Left ovary and fallopian tube: Metastatic carcinoma. ?- Right ovary: No malignancy identified. ?- Right fallopian tube: Luminal tumor, see comment. ?B. OMENTUM, RESECTION:

## 2021-07-23 NOTE — Assessment & Plan Note (Signed)
She has stable pancytopenia likely due to side effects of treatment ?We will monitor closely and proceed with treatment without delay ?

## 2021-07-23 NOTE — Assessment & Plan Note (Signed)
Since recent dose changes, she has persistent pancytopenia but not worse than before ?She has similar symptoms with each cycle of treatment including feeling weak, anorexic with loss of appetite for several days and extreme bone pain but according to the patient, this is tolerable ?We will continue current treatment as scheduled ?I plan to repeat imaging study again in June for further objective assessment of response to treatment ? ?

## 2021-07-30 ENCOUNTER — Inpatient Hospital Stay: Payer: Medicare Other

## 2021-07-30 ENCOUNTER — Other Ambulatory Visit: Payer: Self-pay

## 2021-07-30 ENCOUNTER — Inpatient Hospital Stay (HOSPITAL_BASED_OUTPATIENT_CLINIC_OR_DEPARTMENT_OTHER): Payer: Medicare Other | Admitting: Hematology and Oncology

## 2021-07-30 ENCOUNTER — Encounter: Payer: Self-pay | Admitting: Hematology and Oncology

## 2021-07-30 DIAGNOSIS — C482 Malignant neoplasm of peritoneum, unspecified: Secondary | ICD-10-CM | POA: Diagnosis not present

## 2021-07-30 DIAGNOSIS — D61818 Other pancytopenia: Secondary | ICD-10-CM

## 2021-07-30 DIAGNOSIS — D6481 Anemia due to antineoplastic chemotherapy: Secondary | ICD-10-CM | POA: Diagnosis not present

## 2021-07-30 DIAGNOSIS — Z5111 Encounter for antineoplastic chemotherapy: Secondary | ICD-10-CM | POA: Diagnosis not present

## 2021-07-30 DIAGNOSIS — Z452 Encounter for adjustment and management of vascular access device: Secondary | ICD-10-CM | POA: Diagnosis not present

## 2021-07-30 DIAGNOSIS — C7931 Secondary malignant neoplasm of brain: Secondary | ICD-10-CM

## 2021-07-30 LAB — CBC WITH DIFFERENTIAL (CANCER CENTER ONLY)
Abs Immature Granulocytes: 0.01 10*3/uL (ref 0.00–0.07)
Basophils Absolute: 0 10*3/uL (ref 0.0–0.1)
Basophils Relative: 1 %
Eosinophils Absolute: 0 10*3/uL (ref 0.0–0.5)
Eosinophils Relative: 0 %
HCT: 29.6 % — ABNORMAL LOW (ref 36.0–46.0)
Hemoglobin: 10.1 g/dL — ABNORMAL LOW (ref 12.0–15.0)
Immature Granulocytes: 0 %
Lymphocytes Relative: 52 %
Lymphs Abs: 1.4 10*3/uL (ref 0.7–4.0)
MCH: 32.1 pg (ref 26.0–34.0)
MCHC: 34.1 g/dL (ref 30.0–36.0)
MCV: 94 fL (ref 80.0–100.0)
Monocytes Absolute: 0.3 10*3/uL (ref 0.1–1.0)
Monocytes Relative: 11 %
Neutro Abs: 1 10*3/uL — ABNORMAL LOW (ref 1.7–7.7)
Neutrophils Relative %: 36 %
Platelet Count: 353 10*3/uL (ref 150–400)
RBC: 3.15 MIL/uL — ABNORMAL LOW (ref 3.87–5.11)
RDW: 13.5 % (ref 11.5–15.5)
WBC Count: 2.8 10*3/uL — ABNORMAL LOW (ref 4.0–10.5)
nRBC: 0 % (ref 0.0–0.2)

## 2021-07-30 LAB — CMP (CANCER CENTER ONLY)
ALT: 15 U/L (ref 0–44)
AST: 22 U/L (ref 15–41)
Albumin: 4.1 g/dL (ref 3.5–5.0)
Alkaline Phosphatase: 71 U/L (ref 38–126)
Anion gap: 7 (ref 5–15)
BUN: 10 mg/dL (ref 8–23)
CO2: 30 mmol/L (ref 22–32)
Calcium: 9.2 mg/dL (ref 8.9–10.3)
Chloride: 100 mmol/L (ref 98–111)
Creatinine: 0.51 mg/dL (ref 0.44–1.00)
GFR, Estimated: >60 mL/min (ref >60)
Glucose, Bld: 90 mg/dL (ref 70–99)
Potassium: 3.7 mmol/L (ref 3.5–5.1)
Sodium: 137 mmol/L (ref 135–145)
Total Bilirubin: 0.3 mg/dL (ref 0.3–1.2)
Total Protein: 7.1 g/dL (ref 6.5–8.1)

## 2021-07-30 MED ORDER — SODIUM CHLORIDE 0.9% FLUSH
10.0000 mL | Freq: Once | INTRAVENOUS | Status: AC
  Administered 2021-07-30: 10 mL

## 2021-07-30 NOTE — Assessment & Plan Note (Signed)
She has stable pancytopenia likely due to side effects of treatment ?Due to recent symptoms of subjective fever and excessive fatigue, I do not recommend we proceed with treatment ?Due to her fibromyalgia and chronic joint pain, I suspect giving her G-CSF support will be disastrous ?She is in agreement for observation ?We discussed neutropenic precaution ?

## 2021-07-30 NOTE — Assessment & Plan Note (Signed)
She has no new neurological deficits ?She is scheduled for repeat MRI next month ?

## 2021-07-30 NOTE — Progress Notes (Signed)
[place] ?OFFICE PROGRESS NOTE ? ?Patient Care Team: ?Orpah Melter, MD as PCP - General (Family Medicine) ?Sueanne Margarita, MD as PCP - Cardiology (Cardiology) ?Awanda Mink Craige Cotta, RN as Oncology Nurse Navigator (Oncology) ? 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 ? ?Pancytopenia, acquired ([place]) ?She has stable pancytopenia likely due to side effects of treatment ?Due to recent symptoms of subjective fever and excessive fatigue, I do not recommend we proceed with treatment ?Due to her fibromyalgia and chronic joint pain, I suspect giving her G-CSF support will be disastrous ?She is in agreement for observation ?We discussed neutropenic precaution ? 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 ?No orders of the defined types were placed in this encounter. ? ? ?All questions were answered. The patient knows to call the clinic with any problems, questions or concerns. ?The total time spent in the appointment was 30 minutes encounter with patients including review of chart and various tests results, discussions about plan of care and coordination of care plan ?  ?[name], MD ?07/30/2021 11:06 AM ? ?INTERVAL HISTORY: ?Please see below for problem oriented charting. ?she returns for treatment follow-up seen prior to her chemotherapy ?She has subjective fever intermittently last week ?The highest recorded temperature was 100 Fahrenheit ?There were no association with other symptoms such as chills, sore throat, cough, or localizing signs for infection ?She had diffuse bone aches ?She has very poor  appetite ?She have excessive fatigue and spent most of her time resting ?The patient denies any recent signs or symptoms of bleeding such as spontaneous epistaxis, hematuria or hematochezia. ?She has no new neurological deficits ? ?REVIEW OF SYSTEMS:   ?Ears, nose, mouth, throat, and face: Denies mucositis or sore throat ?Respiratory: Denies cough, dyspnea or wheezes ?Cardiovascular: Denies palpitation, chest discomfort or lower extremity swelling ?Gastrointestinal:  Denies nausea, heartburn or change in bowel habits ?Skin: Denies abnormal skin rashes ?Lymphatics: Denies new lymphadenopathy or easy bruising ?Behavioral/Psych: Mood is stable, no new changes  ?All other systems were reviewed with the patient and are negative. ? ?I have reviewed the past medical history, past surgical history, social history and family history with the patient and they are unchanged from previous note. ? ?ALLERGIES:  is allergic to carboplatin, fentanyl, vancomycin, lac bovis, brimonidine tartrate, ditropan [oxybutynin], milk-related compounds, other, oxybutynin chloride, thimerosal, tramadol, avelox [moxifloxacin hcl in nacl], doxycycline, erythromycin, metronidazole, minocycline, penicillins, quinolones, and sulfonamide derivatives. ? ?MEDICATIONS:  ?Current Outpatient Medications  ?Medication Sig Dispense Refill  ? Azelaic Acid 15 % cream Apply 1 application topically daily.    ? cholecalciferol (VITAMIN D3) 25 MCG (1000 UNIT) tablet Take 2,000 Units by mouth daily.    ? gabapentin (NEURONTIN) 300 MG capsule Take 300-600 mg by mouth See admin instructions. Taking 600 mg in am ;300 mg at lunch; 300 mg at hs.    ? levETIRAcetam (KEPPRA) 500 MG tablet Take 2 tablets (1,000 mg total) by mouth 2 (two) times daily. 120 tablet 3  ? lidocaine-prilocaine (EMLA) cream Apply 1 application topically as needed. 30 g 3  ? Magnesium 250 MG TABS Take 250 mg by mouth daily.    ?  ondansetron (ZOFRAN) 8 MG tablet Take 1 tablet (8 mg total) by mouth  every 8 (eight) hours as needed. 30 tablet 1  ? pantoprazole (PROTONIX) 40 MG tablet Take 1 tablet (40 mg total) by mouth daily. 30 tablet 3  ? prochlorperazine (COMPAZINE) 10 MG tablet Take 1 tablet (10 mg total) by mouth every 6 (six) hours as needed (Nausea or vomiting). 30 tablet 1  ? Sulfacetamide Sodium, Acne, 10 % LOTN Apply 1 application topically at bedtime.    ? VITAMIN E PO Take 50 Int'l Units/day by mouth daily.    ? ?No current facility-administered medications for this visit.  ? ? 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 Ct abdomen and pelvis ?1. Findings highly suspicious for peritoneal carcinomatosis. Recommend paracentesis for therapeutic and diagnostic purposes. I do not see an obvious primary lesion but there is some irregular enhancing soft tissue in the right adnexal area. CA 125 level may be helpful. ?2. Mild surface irregularity involving the liver but I do not see any obvious changes of cirrhosis. No hepatic lesions. ?  ?11/23/2018 Tumor Marker  ? Patient's tumor was tested for the following markers: CA-125 ?Results of the tumor marker test revealed 253. ?  ?11/25/2018 Initial Diagnosis  ? 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 ?  ?11/25/2018 Imaging  ? US paracentesis ?Successful ultrasound-guided paracentesis yielding 3.4 L of peritoneal fluid. ?  ?12/01/2018 Cancer Staging  ? Staging form: Ovary, Fallopian Tube, and Primary Peritoneal Carcinoma, AJCC 8th Edition ?- Clinical stage from 12/01/2018: Stage IVA (rcT3, cN0, pM1a) - Signed by [name], MD on 02/14/2021 ?Stage prefix: Recurrence ? ?  ?12/02/2018 Tumor Marker  ? Patient's tumor was tested for the following markers: CA-125 ?Results of the tumor marker test revealed 267 ?  ?12/06/2018 - 05/16/2019 Chemotherapy  ? The patient had carboplatin and taxol for chemotherapy  treatment.  ? ?  ?12/08/2018 Procedure  ? Successful ultrasound-guided therapeutic paracentesis yielding 1.7 liters of peritoneal fluid. ?  ?12/24/2018 Procedure  ? Successful placement of a right IJ approach Power Port with ultrasound and fluoroscopic guidance. The catheter is ready for use ?  ?01/17/2019 Tumor Marker  ? Patient's tumor was tested for the following markers: CA-125 ?Results of the tumor marker test revealed 57.2 ?  ?01/31/2019 Imaging  ? Significant decrease in peritoneal carcinomatosis since previous study. Interval resolution of ascites. ?  ?Focal area of parenchymal consolidation in central right middle lobe, which measures 3 cm. Differential diagnosis includes infectious or inflammatory process, atelectasis, and neoplasm. Recommend short-term follow-up by chest CT in 2-3 months. ?  ?  ?02/17/2019 Surgery  ? Surgeon: Donaciano Eva  ?   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   ?Operation: Robotic-assisted laparoscopic total hysterectomy with bilateral salpingoophorectomy, omentectomy, radical tumor debulking, minilaparotomy for omentectomy. ?  ?Surgeon: Donaciano Eva ?   ?Operative Findings:  : grossly normal uterus, ovaries normal, few scattered peritoneal nodules (70m) on serosa of uterus and tubes. The omentum (gastrocolic) was tethered to the mesentery with tumor (thin rind). Complete (optimal) resection of tumor with no gross residual disease. ?  ?  ?02/17/2019 Pathology Results  ? FINAL MICROSCOPIC DIAGNOSIS: ?A. UTERUS, CERVIX, BILATERAL FALLOPIAN TUBES AND OVARIES, HYSTERECTOMY WITH SALPINGOOOPHORECTOMY: ?- Uterus: ?Endometrium: Inactive endometrium. No hyperplasia or malignancy. ?Myometrium: Unremarkable. No malignancy. ?Serosa: Metastatic carcinoma.  No malignancy. ?- Cervix: Benign squamous and endocervical mucosa. No dysplasia or malignancy. ?- Left ovary and fallopian tube: Metastatic carcinoma. ?- Right ovary: No malignancy identified. ?- Right  fallopian tube: Luminal tumor, see comment. ?B. OMENTUM, RESECTION: ?- High grade serous carcinoma. ?- Deposits up to at least 3 cm. ?- See oncology table. ?ONCOLOGY TABLE: ?OVARY or FALLOPIAN TUBE or PRIMARY PER

## 2021-07-30 NOTE — Assessment & Plan Note (Signed)
Unfortunately, she have experienced significant side effects from recent treatment ?She is neutropenic today ?Given her recent fever, I recommend with holding treatment today ?She will return in 2 weeks to resume chemotherapy ?She is scheduled for CT imaging in June for objective assessment of response to therapy ? ?

## 2021-08-13 ENCOUNTER — Encounter: Payer: Self-pay | Admitting: Hematology and Oncology

## 2021-08-13 ENCOUNTER — Inpatient Hospital Stay: Payer: Medicare Other

## 2021-08-13 ENCOUNTER — Other Ambulatory Visit: Payer: Self-pay

## 2021-08-13 ENCOUNTER — Inpatient Hospital Stay (HOSPITAL_BASED_OUTPATIENT_CLINIC_OR_DEPARTMENT_OTHER): Payer: Medicare Other | Admitting: Hematology and Oncology

## 2021-08-13 DIAGNOSIS — D6481 Anemia due to antineoplastic chemotherapy: Secondary | ICD-10-CM | POA: Diagnosis not present

## 2021-08-13 DIAGNOSIS — D61818 Other pancytopenia: Secondary | ICD-10-CM | POA: Diagnosis not present

## 2021-08-13 DIAGNOSIS — C482 Malignant neoplasm of peritoneum, unspecified: Secondary | ICD-10-CM

## 2021-08-13 DIAGNOSIS — C7931 Secondary malignant neoplasm of brain: Secondary | ICD-10-CM | POA: Diagnosis not present

## 2021-08-13 DIAGNOSIS — Z7189 Other specified counseling: Secondary | ICD-10-CM

## 2021-08-13 DIAGNOSIS — Z5111 Encounter for antineoplastic chemotherapy: Secondary | ICD-10-CM | POA: Diagnosis not present

## 2021-08-13 DIAGNOSIS — T451X5A Adverse effect of antineoplastic and immunosuppressive drugs, initial encounter: Secondary | ICD-10-CM

## 2021-08-13 DIAGNOSIS — Z452 Encounter for adjustment and management of vascular access device: Secondary | ICD-10-CM | POA: Diagnosis not present

## 2021-08-13 LAB — CMP (CANCER CENTER ONLY)
ALT: 16 U/L (ref 0–44)
AST: 23 U/L (ref 15–41)
Albumin: 4.3 g/dL (ref 3.5–5.0)
Alkaline Phosphatase: 63 U/L (ref 38–126)
Anion gap: 7 (ref 5–15)
BUN: 11 mg/dL (ref 8–23)
CO2: 29 mmol/L (ref 22–32)
Calcium: 9.5 mg/dL (ref 8.9–10.3)
Chloride: 103 mmol/L (ref 98–111)
Creatinine: 0.55 mg/dL (ref 0.44–1.00)
GFR, Estimated: >60 mL/min (ref >60)
Glucose, Bld: 94 mg/dL (ref 70–99)
Potassium: 3.6 mmol/L (ref 3.5–5.1)
Sodium: 139 mmol/L (ref 135–145)
Total Bilirubin: 0.4 mg/dL (ref 0.3–1.2)
Total Protein: 7.1 g/dL (ref 6.5–8.1)

## 2021-08-13 LAB — CBC WITH DIFFERENTIAL (CANCER CENTER ONLY)
Abs Immature Granulocytes: 0.01 10*3/uL (ref 0.00–0.07)
Basophils Absolute: 0 10*3/uL (ref 0.0–0.1)
Basophils Relative: 1 %
Eosinophils Absolute: 0.1 10*3/uL (ref 0.0–0.5)
Eosinophils Relative: 2 %
HCT: 32.7 % — ABNORMAL LOW (ref 36.0–46.0)
Hemoglobin: 11 g/dL — ABNORMAL LOW (ref 12.0–15.0)
Immature Granulocytes: 0 %
Lymphocytes Relative: 27 %
Lymphs Abs: 1.5 10*3/uL (ref 0.7–4.0)
MCH: 31.6 pg (ref 26.0–34.0)
MCHC: 33.6 g/dL (ref 30.0–36.0)
MCV: 94 fL (ref 80.0–100.0)
Monocytes Absolute: 0.6 10*3/uL (ref 0.1–1.0)
Monocytes Relative: 10 %
Neutro Abs: 3.4 10*3/uL (ref 1.7–7.7)
Neutrophils Relative %: 60 %
Platelet Count: 225 10*3/uL (ref 150–400)
RBC: 3.48 MIL/uL — ABNORMAL LOW (ref 3.87–5.11)
RDW: 13.8 % (ref 11.5–15.5)
WBC Count: 5.7 10*3/uL (ref 4.0–10.5)
nRBC: 0 % (ref 0.0–0.2)

## 2021-08-13 MED ORDER — SODIUM CHLORIDE 0.9% FLUSH
10.0000 mL | Freq: Once | INTRAVENOUS | Status: AC
  Administered 2021-08-13: 10 mL

## 2021-08-13 MED ORDER — HEPARIN SOD (PORK) LOCK FLUSH 100 UNIT/ML IV SOLN
500.0000 [IU] | Freq: Once | INTRAVENOUS | Status: AC | PRN
  Administered 2021-08-13: 500 [IU]

## 2021-08-13 MED ORDER — SODIUM CHLORIDE 0.9 % IV SOLN
600.0000 mg/m2 | Freq: Once | INTRAVENOUS | Status: AC
  Administered 2021-08-13: 874 mg via INTRAVENOUS
  Filled 2021-08-13: qty 22.99

## 2021-08-13 MED ORDER — SODIUM CHLORIDE 0.9 % IV SOLN: Freq: Once | INTRAVENOUS | Status: AC

## 2021-08-13 MED ORDER — PROCHLORPERAZINE MALEATE 10 MG PO TABS
10.0000 mg | ORAL_TABLET | Freq: Once | ORAL | Status: AC
  Administered 2021-08-13: 10 mg via ORAL
  Filled 2021-08-13: qty 1

## 2021-08-13 MED ORDER — SODIUM CHLORIDE 0.9% FLUSH
10.0000 mL | INTRAVENOUS | Status: DC | PRN
  Administered 2021-08-13: 10 mL

## 2021-08-13 NOTE — Patient Instructions (Signed)
[facility] MEDICAL ONCOLOGY  Discharge Instructions: Thank you for choosing [facility] to provide your oncology and hematology care.   If you have a lab appointment with the Cancer Center, please go directly to the Cancer Center and check in at the registration area.   Wear comfortable clothing and clothing appropriate for easy access to any Portacath or PICC line.   We strive to give you quality time with your provider. You may need to reschedule your appointment if you arrive late (15 or more minutes).  Arriving late affects you and other patients whose appointments are after yours.  Also, if you miss three or more appointments without notifying the office, you may be dismissed from the clinic at the provider's discretion.      For prescription refill requests, have your pharmacy contact our office and allow 72 hours for refills to be completed.    Today you received the following chemotherapy and/or immunotherapy agent: Gemcitabine (Gemzar)   To help prevent nausea and vomiting after your treatment, we encourage you to take your nausea medication as directed.  BELOW ARE SYMPTOMS THAT SHOULD BE REPORTED IMMEDIATELY: *FEVER GREATER THAN 100.4 F (38 C) OR HIGHER *CHILLS OR SWEATING *NAUSEA AND VOMITING THAT IS NOT CONTROLLED WITH YOUR NAUSEA MEDICATION *UNUSUAL SHORTNESS OF BREATH *UNUSUAL BRUISING OR BLEEDING *URINARY PROBLEMS (pain or burning when urinating, or frequent urination) *BOWEL PROBLEMS (unusual diarrhea, constipation, pain near the anus) TENDERNESS IN MOUTH AND THROAT WITH OR WITHOUT PRESENCE OF ULCERS (sore throat, sores in mouth, or a toothache) UNUSUAL RASH, SWELLING OR PAIN  UNUSUAL VAGINAL DISCHARGE OR ITCHING   Items with * indicate a potential emergency and should be followed up as soon as possible or go to the Emergency Department if any problems should occur.  Please show the CHEMOTHERAPY ALERT CARD or IMMUNOTHERAPY ALERT CARD at  check-in to the Emergency Department and triage nurse.  Should you have questions after your visit or need to cancel or reschedule your appointment, please contact [facility]  Dept: [phone]  and follow the prompts.  Office hours are 8:00 a.m. to 4:30 p.m. Monday - Friday. Please note that voicemails left after 4:00 p.m. may not be returned until the following business day.  We are closed weekends and major holidays. You have access to a nurse at all times for urgent questions. Please call the main number to the clinic Dept: [phone] and follow the prompts.   For any non-urgent questions, you may also contact your provider using MyChart. We now offer e-Visits for anyone 18 and older to request care online for non-urgent symptoms. For details visit [email or web address].   Also download the MyChart app! Go to the app store, search "MyChart", open the app, select , and log in with your MyChart username and password.  Due to Covid, a mask is required upon entering the hospital/clinic. If you do not have a mask, one will be given to you upon arrival. For doctor visits, patients may have 1 support person aged 18 or older with them. For treatment visits, patients cannot have anyone with them due to current Covid guidelines and our immunocompromised population.   

## 2021-08-13 NOTE — Assessment & Plan Note (Signed)
She has borderline performance status with each treatment causing significant fatigue She had concerns over recent dose adjustment and omission of treatment due to severe pancytopenia We will proceed with treatment as scheduled today with further dose reduction She is scheduled for MRI of the brain as well as CT imaging of her body for objective assessment of response to treatment We will reassess and discuss test results next week The patient is reassured today that there are other available options if she had difficulties tolerating gemcitabine further

## 2021-08-13 NOTE — Assessment & Plan Note (Signed)
CBC has improved with recent omission of treatment Observe only for now Due to her recent severe pancytopenia, we will proceed with slight further dose reduction of gemcitabine

## 2021-08-13 NOTE — Assessment & Plan Note (Signed)
We have further discussions about goals of care and choices of maintenance treatment We will have further discussion next week once we have results of her MRI and CT imaging

## 2021-08-13 NOTE — Progress Notes (Signed)
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[facility]) She has borderline performance status with each treatment causing significant fatigue She had concerns over recent dose adjustment and omission of treatment due to severe pancytopenia We will proceed with treatment as scheduled today with further dose reduction She is scheduled for MRI of the brain as well as CT imaging of her body for objective assessment of response to treatment We will reassess and discuss test results next week The patient is reassured today that there are other available options if she had difficulties tolerating gemcitabine further  Anemia due to antineoplastic chemotherapy CBC has improved with recent omission of treatment Observe only for now Due to her recent severe pancytopenia, we will proceed with slight further dose reduction of gemcitabine  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   All questions were answered. The patient knows to call the clinic with any problems, questions or concerns. The total time spent in the appointment was 30 minutes encounter with patients including review of chart and various tests results, discussions about plan of care and coordination of care plan   [name], MD 08/13/2021 11:51  AM  INTERVAL HISTORY: Please see below for problem oriented charting. she returns for treatment follow-up  She had multiple questions and concerns due to missing treatment recently She has no neurological deficit She complained of excessive fatigue and some shortness of breath but much improved since the last time I saw her  REVIEW OF SYSTEMS:   Constitutional: Denies fevers, chills or abnormal weight loss Ears, nose, mouth, throat, and face: Denies mucositis or sore throat Gastrointestinal:  Denies nausea, heartburn or change in bowel habits Skin: Denies abnormal skin rashes Lymphatics: Denies new lymphadenopathy or easy bruising Behavioral/Psych: Mood is stable, no new changes  All other systems were reviewed with the patient and are negative.  I have reviewed the past medical history, past surgical history, social history and family history with the patient and they are unchanged from previous note.  ALLERGIES:  is allergic to carboplatin, fentanyl, vancomycin, lac bovis, brimonidine tartrate, ditropan [oxybutynin], milk-related compounds, other, oxybutynin chloride, thimerosal, tramadol, avelox [moxifloxacin hcl in nacl], doxycycline, erythromycin, metronidazole, minocycline, penicillins, quinolones, and sulfonamide derivatives.  MEDICATIONS:  Current Outpatient Medications  Medication Sig Dispense Refill   Azelaic Acid 15 % cream Apply 1 application topically daily.     cholecalciferol (VITAMIN D3) 25 MCG (1000 UNIT) tablet Take 2,000 Units by mouth daily.     gabapentin (NEURONTIN) 300 MG capsule Take 300-600 mg by mouth See admin instructions. Taking 600 mg in am ;300 mg at lunch; 300 mg at hs.     levETIRAcetam (KEPPRA) 500 MG tablet Take 2 tablets (1,000 mg total) by mouth 2 (two) times daily. 120 tablet 3   lidocaine-prilocaine (EMLA)  cream Apply 1 application topically as needed. 30 g 3   Magnesium 250 MG TABS Take 250 mg by mouth daily as needed.     ondansetron (ZOFRAN) 8  MG tablet Take 1 tablet (8 mg total) by mouth every 8 (eight) hours as needed. 30 tablet 1   pantoprazole (PROTONIX) 40 MG tablet Take 1 tablet (40 mg total) by mouth daily. (Patient taking differently: Take 40 mg by mouth daily as needed.) 30 tablet 3   prochlorperazine (COMPAZINE) 10 MG tablet Take 1 tablet (10 mg total) by mouth every 6 (six) hours as needed (Nausea or vomiting). 30 tablet 1   Sulfacetamide Sodium, Acne, 10 % LOTN Apply 1 application topically at bedtime.     VITAMIN E PO Take 50 Int'l Units/day by mouth daily.     No current facility-administered medications for this visit.   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 Findings highly suspicious for peritoneal carcinomatosis. Recommend paracentesis for therapeutic and diagnostic purposes. I do not see an obvious primary lesion but there is some irregular enhancing soft tissue in the right adnexal area. CA 125 level may be helpful. 2. Mild surface irregularity involving the liver but I do not see any obvious changes of cirrhosis. No hepatic lesions.   11/23/2018 Tumor Marker   Patient's tumor was tested for the following markers: CA-125 Results of the tumor marker test revealed 253.   11/25/2018 Initial Diagnosis   Peritoneal carcinoma ([place])    11/25/2018 Imaging   US paracentesis Successful ultrasound-guided paracentesis yielding 3.4 L of peritoneal fluid.   12/01/2018 Cancer Staging   Staging form: Ovary, Fallopian Tube, and Primary Peritoneal Carcinoma, AJCC 8th Edition -  Clinical stage from 12/01/2018: Stage IVA (rcT3, cN0, pM1a) - Signed by [name], MD on 02/14/2021 Stage prefix: Recurrence    12/02/2018 Tumor Marker   Patient's tumor was tested for the following markers: CA-125 Results of the tumor marker test revealed 267   12/06/2018 - 05/16/2019 Chemotherapy   The patient had carboplatin and taxol for chemotherapy treatment.     12/08/2018 Procedure   Successful ultrasound-guided therapeutic paracentesis yielding 1.7 liters of peritoneal fluid.   12/24/2018 Procedure   Successful placement of a right IJ approach Power Port with ultrasound and fluoroscopic guidance. The catheter is ready for use   01/17/2019 Tumor Marker   Patient's tumor was tested for the following markers: CA-125 Results of the tumor marker test revealed 57.2   01/31/2019 Imaging   Significant decrease in peritoneal carcinomatosis since previous study. Interval resolution of ascites.   Focal area of parenchymal consolidation in central right middle lobe, which measures 3 cm. Differential diagnosis includes infectious or inflammatory process, atelectasis, and neoplasm. Recommend short-term follow-up by chest CT in 2-3 months.     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   Surgeon: Donaciano Eva  Operative Findings:  : grossly normal uterus, ovaries normal, few scattered peritoneal nodules (50m) on serosa of uterus and tubes. The omentum (gastrocolic) was tethered to the mesentery with tumor (thin rind). Complete (optimal) resection of tumor with no gross residual disease.     02/17/2019 Pathology Results   FINAL MICROSCOPIC DIAGNOSIS: A. UTERUS, CERVIX, BILATERAL FALLOPIAN TUBES AND OVARIES, HYSTERECTOMY WITH  SALPINGOOOPHORECTOMY: - Uterus: Endometrium: Inactive endometrium. No hyperplasia or malignancy. Myometrium: Unremarkable. No malignancy. Serosa: Metastatic carcinoma. No malignancy. - Cervix: Benign squamous and endocervical mucosa. No dysplasia or malignancy. - Left ovary and fallopian tube: Metastatic carcinoma. - Right ovary: No malignancy identified. - Right fallopian tube: Luminal tumor, see comment. B. OMENTUM, RESECTION: - High grade serous carcinoma. - Deposits up to at least 3 cm. - See oncology table. ONCOLOGY TABLE: OVARY or FALLOPIAN TUBE or PRIMARY PERITONEUM: Procedure: Hysterectomy with bilateral salpingo-oophorectomy and omentectomy. Specimen Integrity: Intact Tumor Site: Peritoneum Ovarian Surface Involvement (required only if applicable): Left ovary Fallopian Tube Surface Involvement (required only if applicable): Left Fallopian tube Tumor Size: Largest deposit 3 cm Histologic Type: High-grade serous carcinoma Histologic Grade: High-grade Implants (required for advanced stage serous/seromucinous borderline tumors only): Uterine serosa, left fallopian tube and ovary, omentum Other Tissue/ Organ Involvement: As above Largest Extrapelvic Peritoneal Focus (required only if applicable): 3 cm Peritoneal/Ascitic Fluid: Pre neoadjuvant [id number] positive for carcinoma. Treatment Effect (required only for high-grade serous carcinomas): Probably partial treatment effect in omental tissue. Regional Lymph Nodes: No lymph nodes submitted or found Pathologic Stage Classification (pTNM, AJCC 8th Edition): ypT3c, ypNX Representative Tumor Block: B5 Comment(s): There is surface involvement of the left ovary and fallopian tube but no primary tumor. Within the right fallopian tube lumen there is detached fragments of tumor but again no precursor lesion is noted in the right fallopian tube. Thus, the tumor is presumed primary peritoneal and staged as such.   03/21/2019 Tumor Marker    Patient's tumor was tested for the following markers: CA-125 Results of the tumor marker test revealed 15.1    Genetic Testing   Negative genetic testing. No pathogenic variants identified on the [facility] CancerNext + RNAinsight panel. VUS in ATM called [id number] identified. The report date is 05/12/2019. TumorNext HRD was originally ordered but there was not enough sample to complete this testing.   The CancerNext+RNAinsight gene panel offered by [employer] [facility] sequencing and rearrangement analysis for the following 36 genes: APC*, ATM*, AXIN2, BARD1, BMPR1A, BRCA1*, BRCA2*, BRIP1*, CDH1*, CDK4, CDKN2A, CHEK2*, DICER1, MLH1*, MSH2*, MSH3, MSH6*, MUTYH*, NBN, NF1*, NTHL1, PALB2*, PMS2*, PTEN*, RAD51C*, RAD51D*, RECQL, SMAD4, SMARCA4, STK11 and TP53* (sequencing and deletion/duplication); HOXB13, POLD1 and POLE (sequencing only); EPCAM and GREM1 (deletion/duplication only). DNA and RNA analyses performed for * genes.    05/16/2019 Tumor Marker   Patient's tumor was tested for the following markers: CA-125 Results of the tumor marker test revealed 10.7   06/16/2019 Imaging   1. No evidence of residual or recurrent metastatic disease in the chest, abdomen or pelvis. 2. Masslike focus of consolidation in the right middle lobe along the major and minor fissures with associated volume loss is stable since 01/31/2019. Indolent primary pulmonary neoplasm not excluded. PET-CT may be considered for further characterization. 3. Aortic Atherosclerosis (ICD10-I70.0).   06/16/2019 Tumor Marker   Patient's tumor was tested for the following markers: CA-125 Results of the tumor marker test revealed 9.1.   12/05/2019 Tumor Marker   Patient's tumor was tested for the following markers: CA-125. Results of the tumor marker  test revealed 9.0.   02/29/2020 Tumor Marker   Patient's tumor was tested for the following markers: CA-`125 Results of the tumor marker test revealed 9.7.   05/28/2020 Tumor Marker    Patient's tumor was tested for the following markers: CA-125 Results of the tumor marker test revealed 9.5   08/16/2020 Tumor Marker   Patient's tumor was tested for the following markers: CA-125 Results of the tumor marker test revealed 10.9   11/29/2020 Tumor Marker   Patient's tumor was tested for the following markers: CA-125. Results of the tumor marker test revealed 10.2.   01/22/2021 Pathology Results   SURGICAL PATHOLOGY  CASE: [id number]  PATIENT: Sampson Goon  Surgical Pathology Report   Clinical History: brain metastasis (cm)    FINAL MICROSCOPIC DIAGNOSIS:   A. 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    03/20/2021 Imaging   1. Unchanged enlarged midline superior mediastinal and left retroperitoneal lymph nodes, which remain suspicious for metastatic disease. 2. Unchanged masslike consolidation and volume loss of the right middle lobe measuring 3.8 x 1.9 cm, likely chronic scarring. Continued attention on follow-up. 3. No new evidence of metastatic disease in the chest, abdomen, or pelvis. 4. Status post hysterectomy and omentectomy.   Aortic Atherosclerosis (ICD10-I70.0).     03/21/2021 - 05/07/2021 Chemotherapy   Patient is on Treatment Plan : OVARIAN RECURRENT 3RD LINE Carboplatin D1 / Gemcitabine D1,8 (4/800) q21d      05/17/2021 Imaging   1. No new suspicious mass or lymphadenopathy identified in the chest, abdomen or pelvis. 2. Interval decreased size of previous enlarged superior mediastinal and left retroperitoneal lymph nodes. 3. Other ancillary findings as described.   05/21/2021 -  Chemotherapy   Patient is on Treatment Plan : OVARIAN Gemcitabine D1,8 q21d         PHYSICAL EXAMINATION: ECOG PERFORMANCE STATUS: 1 - Symptomatic but completely ambulatory  Vitals:   08/13/21 0805  BP: (!) 102/50  Pulse: 75  Resp:  18  Temp: 98.1 F (36.7 C)  SpO2: 100%   Filed Weights   08/13/21 0805  Weight: 103 lb 6.4 oz (46.9 kg)    GENERAL:alert, no distress and comfortable NEURO: alert & oriented x 3 with fluent speech, no focal motor/sensory deficits  LABORATORY DATA:  I have reviewed the data as listed    Component Value Date/Time   NA 139 08/13/2021 0733   K 3.6 08/13/2021 0733   CL 103 08/13/2021 0733   CO2 29 08/13/2021 0733   GLUCOSE 94 08/13/2021 0733   BUN 11 08/13/2021 0733   CREATININE 0.55 08/13/2021 0733   CALCIUM 9.5 08/13/2021 0733   PROT 7.1 08/13/2021 0733   ALBUMIN 4.3 08/13/2021 0733   AST 23 08/13/2021 0733   ALT 16 08/13/2021 0733   ALKPHOS 63 08/13/2021 0733   BILITOT 0.4 08/13/2021 0733   GFRNONAA >60 08/13/2021 0733   GFRAA >60 12/05/2019 1145    No results found for: SPEP, UPEP  Lab Results  Component Value Date   WBC 5.7 08/13/2021   NEUTROABS 3.4 08/13/2021   HGB 11.0 (L) 08/13/2021   HCT 32.7 (L) 08/13/2021   MCV 94.0 08/13/2021   PLT 225 08/13/2021      Chemistry      Component Value Date/Time   NA 139 08/13/2021 0733   K 3.6 08/13/2021 0733   CL 103 08/13/2021 0733  CO2 29 08/13/2021 0733   BUN 11 08/13/2021 0733   CREATININE 0.55 08/13/2021 0733      Component Value Date/Time   CALCIUM 9.5 08/13/2021 0733   ALKPHOS 63 08/13/2021 0733   AST 23 08/13/2021 0733   ALT 16 08/13/2021 0733   BILITOT 0.4 08/13/2021 2446

## 2021-08-13 NOTE — Assessment & Plan Note (Signed)
She is scheduled for MRI of her brain in 2 days time She is concerned she might have excessive fatigue I recommend she tries to keep her appointment but if she is not able to, to give [name] a call

## 2021-08-15 ENCOUNTER — Ambulatory Visit
Admission: RE | Admit: 2021-08-15 | Discharge: 2021-08-15 | Disposition: A | Discharge status: Home / Self Care | Payer: Medicare Other | Source: Ambulatory Visit | Attending: Internal Medicine | Admitting: Internal Medicine

## 2021-08-15 DIAGNOSIS — C713 Malignant neoplasm of parietal lobe: Secondary | ICD-10-CM | POA: Diagnosis not present

## 2021-08-15 DIAGNOSIS — C7931 Secondary malignant neoplasm of brain: Secondary | ICD-10-CM | POA: Diagnosis not present

## 2021-08-15 MED ORDER — GADOBENATE DIMEGLUMINE 529 MG/ML IV SOLN
8.0000 mL | Freq: Once | INTRAVENOUS | Status: AC | PRN
  Administered 2021-08-15: 8 mL via INTRAVENOUS

## 2021-08-16 ENCOUNTER — Ambulatory Visit (HOSPITAL_COMMUNITY)
Admission: RE | Admit: 2021-08-16 | Discharge: 2021-08-16 | Disposition: A | Discharge status: Home / Self Care | Payer: Medicare Other | Source: Ambulatory Visit | Attending: Hematology and Oncology | Admitting: Hematology and Oncology

## 2021-08-16 ENCOUNTER — Inpatient Hospital Stay: Payer: Medicare Other | Attending: Gynecologic Oncology

## 2021-08-16 ENCOUNTER — Other Ambulatory Visit: Payer: Self-pay

## 2021-08-16 ENCOUNTER — Other Ambulatory Visit (HOSPITAL_COMMUNITY): Payer: Medicare Other

## 2021-08-16 DIAGNOSIS — I700 Atherosclerosis of aorta: Secondary | ICD-10-CM | POA: Diagnosis not present

## 2021-08-16 DIAGNOSIS — S2242XA Multiple fractures of ribs, left side, initial encounter for closed fracture: Secondary | ICD-10-CM | POA: Insufficient documentation

## 2021-08-16 DIAGNOSIS — Z8679 Personal history of other diseases of the circulatory system: Secondary | ICD-10-CM | POA: Insufficient documentation

## 2021-08-16 DIAGNOSIS — C569 Malignant neoplasm of unspecified ovary: Secondary | ICD-10-CM | POA: Diagnosis not present

## 2021-08-16 DIAGNOSIS — S20222A Contusion of left back wall of thorax, initial encounter: Secondary | ICD-10-CM | POA: Insufficient documentation

## 2021-08-16 DIAGNOSIS — C7931 Secondary malignant neoplasm of brain: Secondary | ICD-10-CM | POA: Insufficient documentation

## 2021-08-16 DIAGNOSIS — C482 Malignant neoplasm of peritoneum, unspecified: Secondary | ICD-10-CM | POA: Insufficient documentation

## 2021-08-16 DIAGNOSIS — R569 Unspecified convulsions: Secondary | ICD-10-CM | POA: Insufficient documentation

## 2021-08-16 DIAGNOSIS — R0781 Pleurodynia: Secondary | ICD-10-CM | POA: Insufficient documentation

## 2021-08-16 DIAGNOSIS — R590 Localized enlarged lymph nodes: Secondary | ICD-10-CM | POA: Diagnosis not present

## 2021-08-16 DIAGNOSIS — J9811 Atelectasis: Secondary | ICD-10-CM | POA: Diagnosis not present

## 2021-08-16 DIAGNOSIS — Z90710 Acquired absence of both cervix and uterus: Secondary | ICD-10-CM | POA: Diagnosis not present

## 2021-08-16 DIAGNOSIS — Z853 Personal history of malignant neoplasm of breast: Secondary | ICD-10-CM | POA: Insufficient documentation

## 2021-08-16 DIAGNOSIS — M25512 Pain in left shoulder: Secondary | ICD-10-CM | POA: Insufficient documentation

## 2021-08-16 DIAGNOSIS — Z452 Encounter for adjustment and management of vascular access device: Secondary | ICD-10-CM | POA: Insufficient documentation

## 2021-08-16 DIAGNOSIS — D61810 Antineoplastic chemotherapy induced pancytopenia: Secondary | ICD-10-CM | POA: Insufficient documentation

## 2021-08-16 DIAGNOSIS — Z8709 Personal history of other diseases of the respiratory system: Secondary | ICD-10-CM | POA: Insufficient documentation

## 2021-08-16 MED ORDER — HEPARIN SOD (PORK) LOCK FLUSH 100 UNIT/ML IV SOLN
INTRAVENOUS | Status: AC
  Filled 2021-08-16: qty 5

## 2021-08-16 MED ORDER — SODIUM CHLORIDE (PF) 0.9 % IJ SOLN
INTRAMUSCULAR | Status: AC
  Filled 2021-08-16: qty 50

## 2021-08-16 MED ORDER — HEPARIN SOD (PORK) LOCK FLUSH 100 UNIT/ML IV SOLN
500.0000 [IU] | Freq: Once | INTRAVENOUS | Status: AC
  Administered 2021-08-16: 500 [IU] via INTRAVENOUS

## 2021-08-16 MED ORDER — SODIUM CHLORIDE 0.9% FLUSH
10.0000 mL | Freq: Once | INTRAVENOUS | Status: AC
  Administered 2021-08-16: 10 mL

## 2021-08-16 MED ORDER — IOHEXOL 300 MG/ML  SOLN
100.0000 mL | Freq: Once | INTRAMUSCULAR | Status: AC | PRN
  Administered 2021-08-16: 100 mL via INTRAVENOUS

## 2021-08-19 ENCOUNTER — Inpatient Hospital Stay: Payer: Medicare Other

## 2021-08-19 ENCOUNTER — Inpatient Hospital Stay (HOSPITAL_BASED_OUTPATIENT_CLINIC_OR_DEPARTMENT_OTHER): Payer: Medicare Other | Admitting: Internal Medicine

## 2021-08-19 ENCOUNTER — Other Ambulatory Visit: Payer: Self-pay

## 2021-08-19 VITALS — BP 125/56 | HR 93 | Temp 97.8°F | Resp 17 | Wt 98.5 lb

## 2021-08-19 DIAGNOSIS — Z8709 Personal history of other diseases of the respiratory system: Secondary | ICD-10-CM | POA: Diagnosis not present

## 2021-08-19 DIAGNOSIS — C7931 Secondary malignant neoplasm of brain: Secondary | ICD-10-CM | POA: Diagnosis not present

## 2021-08-19 DIAGNOSIS — Z452 Encounter for adjustment and management of vascular access device: Secondary | ICD-10-CM | POA: Diagnosis not present

## 2021-08-19 DIAGNOSIS — S20222A Contusion of left back wall of thorax, initial encounter: Secondary | ICD-10-CM | POA: Diagnosis not present

## 2021-08-19 DIAGNOSIS — S2242XA Multiple fractures of ribs, left side, initial encounter for closed fracture: Secondary | ICD-10-CM | POA: Diagnosis not present

## 2021-08-19 DIAGNOSIS — M25512 Pain in left shoulder: Secondary | ICD-10-CM | POA: Diagnosis not present

## 2021-08-19 DIAGNOSIS — C482 Malignant neoplasm of peritoneum, unspecified: Secondary | ICD-10-CM | POA: Diagnosis not present

## 2021-08-19 DIAGNOSIS — D61810 Antineoplastic chemotherapy induced pancytopenia: Secondary | ICD-10-CM | POA: Diagnosis not present

## 2021-08-19 DIAGNOSIS — R569 Unspecified convulsions: Secondary | ICD-10-CM

## 2021-08-19 DIAGNOSIS — T451X5A Adverse effect of antineoplastic and immunosuppressive drugs, initial encounter: Secondary | ICD-10-CM | POA: Diagnosis not present

## 2021-08-19 DIAGNOSIS — Z853 Personal history of malignant neoplasm of breast: Secondary | ICD-10-CM | POA: Diagnosis not present

## 2021-08-19 DIAGNOSIS — G620 Drug-induced polyneuropathy: Secondary | ICD-10-CM

## 2021-08-19 DIAGNOSIS — R0781 Pleurodynia: Secondary | ICD-10-CM | POA: Diagnosis not present

## 2021-08-19 DIAGNOSIS — Z8679 Personal history of other diseases of the circulatory system: Secondary | ICD-10-CM | POA: Diagnosis not present

## 2021-08-19 NOTE — Progress Notes (Signed)
[facility] [facility] [place], [place] [address] [phone]   Interval Evaluation  Date of Service: 08/19/21 Patient Name: [name] [name] Patient MRN: [id number] Patient DOB: 1950/11/19 Provider: Ventura Sellers, MD  Identifying Statement:  [name] [name] is a 71 y.o. 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 Findings highly suspicious for peritoneal carcinomatosis. Recommend paracentesis for therapeutic and diagnostic purposes. I do not see an obvious primary lesion but there is some irregular enhancing soft tissue in the right adnexal area. CA 125 level may be helpful. 2. Mild surface irregularity involving the liver but I do not see any obvious changes of cirrhosis. No hepatic lesions.   11/23/2018 Tumor Marker   Patient's tumor was tested for the following markers: CA-125 Results of the tumor marker test revealed 253.   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   12/01/2018 Cancer Staging   Staging form: Ovary, Fallopian Tube, and Primary Peritoneal Carcinoma, AJCC 8th Edition - Clinical stage from 12/01/2018: Stage IVA (rcT3, cN0, pM1a) - Signed by [name], MD on 02/14/2021 Stage prefix: Recurrence    12/02/2018 Tumor Marker   Patient's tumor was tested for the following markers: CA-125 Results of the tumor marker test revealed 267   12/06/2018 - 05/16/2019 Chemotherapy   The patient had carboplatin and taxol for chemotherapy treatment.     12/08/2018 Procedure   Successful ultrasound-guided therapeutic  paracentesis yielding 1.7 liters of peritoneal fluid.   12/24/2018 Procedure   Successful placement of a right IJ approach Power Port with ultrasound and fluoroscopic guidance. The catheter is ready for use   01/17/2019 Tumor Marker   Patient's tumor was tested for the following markers: CA-125 Results of the tumor marker test revealed 57.2   01/31/2019 Imaging   Significant decrease in peritoneal carcinomatosis since previous study. Interval resolution of ascites.   Focal area of parenchymal consolidation in central right middle lobe, which measures 3 cm. Differential diagnosis includes infectious or inflammatory process, atelectasis, and neoplasm. Recommend short-term follow-up by chest CT in 2-3 months.     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   Surgeon: Donaciano Eva    Operative Findings:  : grossly normal uterus, ovaries normal, few scattered peritoneal nodules (37m) on serosa of uterus and tubes. The omentum (gastrocolic) was tethered to the mesentery with tumor (thin rind). Complete (optimal) resection of tumor with no gross residual disease.     02/17/2019 Pathology Results   FINAL MICROSCOPIC DIAGNOSIS: A. UTERUS, CERVIX, BILATERAL FALLOPIAN TUBES AND OVARIES, HYSTERECTOMY WITH SALPINGOOOPHORECTOMY: - Uterus: Endometrium: Inactive endometrium. No hyperplasia or malignancy. Myometrium: Unremarkable. No malignancy. Serosa: Metastatic carcinoma. No malignancy. - Cervix: Benign squamous and endocervical mucosa. No dysplasia or malignancy. - Left ovary and fallopian tube: Metastatic carcinoma. - Right ovary: No malignancy identified. - Right fallopian tube: Luminal tumor, see comment. B. OMENTUM, RESECTION: - High grade  serous  carcinoma. - Deposits up to at least 3 cm. - See oncology table. ONCOLOGY TABLE: OVARY or FALLOPIAN TUBE or PRIMARY PERITONEUM: Procedure: Hysterectomy with bilateral salpingo-oophorectomy and omentectomy. Specimen Integrity: Intact Tumor Site: Peritoneum Ovarian Surface Involvement (required only if applicable): Left ovary Fallopian Tube Surface Involvement (required only if applicable): Left Fallopian tube Tumor Size: Largest deposit 3 cm Histologic Type: High-grade serous carcinoma Histologic Grade: High-grade Implants (required for advanced stage serous/seromucinous borderline tumors only): Uterine serosa, left fallopian tube and ovary, omentum Other Tissue/ Organ Involvement: As above Largest Extrapelvic Peritoneal Focus (required only if applicable): 3 cm Peritoneal/Ascitic Fluid: Pre neoadjuvant [id number] positive for carcinoma. Treatment Effect (required only for high-grade serous carcinomas): Probably partial treatment effect in omental tissue. Regional Lymph Nodes: No lymph nodes submitted or found Pathologic Stage Classification (pTNM, AJCC 8th Edition): ypT3c, ypNX Representative Tumor Block: B5 Comment(s): There is surface involvement of the left ovary and fallopian tube but no primary tumor. Within the right fallopian tube lumen there is detached fragments of tumor but again no precursor lesion is noted in the right fallopian tube. Thus, the tumor is presumed primary peritoneal and staged as such.   03/21/2019 Tumor Marker   Patient's tumor was tested for the following markers: CA-125 Results of the tumor marker test revealed 15.1    Genetic Testing   Negative genetic testing. No pathogenic variants identified on the [facility] CancerNext + RNAinsight panel. VUS in ATM called [id number] identified. The report date is 05/12/2019. TumorNext HRD was originally ordered but there was not enough sample to complete this testing.   The CancerNext+RNAinsight gene panel offered by  [employer] includes sequencing and rearrangement analysis for the following 36 genes: APC*, ATM*, AXIN2, BARD1, BMPR1A, BRCA1*, BRCA2*, BRIP1*, CDH1*, CDK4, CDKN2A, CHEK2*, DICER1, MLH1*, MSH2*, MSH3, MSH6*, MUTYH*, NBN, NF1*, NTHL1, PALB2*, PMS2*, PTEN*, RAD51C*, RAD51D*, RECQL, SMAD4, SMARCA4, STK11 and TP53* (sequencing and deletion/duplication); HOXB13, POLD1 and POLE (sequencing only); EPCAM and GREM1 (deletion/duplication only). DNA and RNA analyses performed for * genes.    05/16/2019 Tumor Marker   Patient's tumor was tested for the following markers: CA-125 Results of the tumor marker test revealed 10.7   06/16/2019 Imaging   1. No evidence of residual or recurrent metastatic disease in the chest, abdomen or pelvis. 2. Masslike focus of consolidation in the right middle lobe along the major and minor fissures with associated volume loss is stable since 01/31/2019. Indolent primary pulmonary neoplasm not excluded. PET-CT may be considered for further characterization. 3. Aortic Atherosclerosis (ICD10-I70.0).   06/16/2019 Tumor Marker   Patient's tumor was tested for the following markers: CA-125 Results of the tumor marker test revealed 9.1.   12/05/2019 Tumor Marker   Patient's tumor was tested for the following markers: CA-125. Results of the tumor marker test revealed 9.0.   02/29/2020 Tumor Marker   Patient's tumor was tested for the following markers: CA-`125 Results of the tumor marker test revealed 9.7.   05/28/2020 Tumor Marker   Patient's tumor was tested for the following markers: CA-125 Results of the tumor marker test revealed 9.5   08/16/2020 Tumor Marker   Patient's tumor was tested for the following markers: CA-125 Results of the tumor marker test revealed 10.9   11/29/2020 Tumor Marker   Patient's tumor was tested for the following markers: CA-125. Results of the tumor marker test revealed 10.2.   01/22/2021 Pathology Results   SURGICAL PATHOLOGY  CASE:  [phone]  PATIENT: [name] [name]  Surgical Pathology Report   Clinical History:  brain metastasis (cm)    FINAL MICROSCOPIC DIAGNOSIS:   A. 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    03/20/2021 Imaging   1. Unchanged enlarged midline superior mediastinal and left retroperitoneal lymph nodes, which remain suspicious for metastatic disease. 2. Unchanged masslike consolidation and volume loss of the right middle lobe measuring 3.8 x 1.9 cm, likely chronic scarring. Continued attention on follow-up. 3. No new evidence of metastatic disease in the chest, abdomen, or pelvis. 4. Status post hysterectomy and omentectomy.   Aortic Atherosclerosis (ICD10-I70.0).     03/21/2021 - 05/07/2021 Chemotherapy   Patient is on Treatment Plan : OVARIAN RECURRENT 3RD LINE Carboplatin D1 / Gemcitabine D1,8 (4/800) q21d      05/17/2021 Imaging   1. No new suspicious mass or lymphadenopathy identified in the chest, abdomen or pelvis. 2. Interval decreased size of previous enlarged superior mediastinal and left retroperitoneal lymph nodes. 3. Other ancillary findings as described.   05/21/2021 -  Chemotherapy   Patient is on Treatment Plan : OVARIAN Gemcitabine D1,8 q21d       08/19/2021 Imaging   Slight decrease in size of small left retroperitoneal and superior mediastinal lymph nodes.   No new or progressive disease within the chest, abdomen, or pelvis.   Aortic Atherosclerosis (ICD10-I70.0).      CNS Oncologic History 01/22/21: R frontal craniotomy, resection with Dr. Reatha Armour 02/21/21: Post-op SRS and SRS to L parietal target Tammi Klippel)  Interval History: [name] [name] presents for follow up.  No new or progressive changes. She does have occasional episodes of facial twitching c/w prior seizures, continues on Keppra $RemoveB'1000mg'cIfDEDxk$  BID.  Continues on carboplatin and  gemcitabine chemotherapy with Dr. Alvy Bimler, though she has not been tolerating it well last few cycles.  H+P (12/25/20) Patient presented to the ED on 12/22/20 with twitching of her left side of her face for 1 to 2 minutes, accompanied by slurring of speech.  There was no tongue biting, bowel or bladder incontinence.  Workup demonstrated two brain lesions consistent with suspected metastases.  She was started on Keppra (no decadron) and discharged two days later with no further seizure like events.  Priro to admission she had also been complaining of dizziness, confusion, impaired memory.  Has been seeing Dr. Alvy Bimler since 2021 for primary peritoneal carcinoma.   Medications: Current Outpatient Medications on File Prior to Visit  Medication Sig Dispense Refill   Azelaic Acid 15 % cream Apply 1 application topically daily.     cholecalciferol (VITAMIN D3) 25 MCG (1000 UNIT) tablet Take 2,000 Units by mouth daily.     gabapentin (NEURONTIN) 300 MG capsule Take 300-600 mg by mouth See admin instructions. Taking 600 mg in am ;300 mg at lunch; 300 mg at hs.     levETIRAcetam (KEPPRA) 500 MG tablet Take 2 tablets (1,000 mg total) by mouth 2 (two) times daily. 120 tablet 3   lidocaine-prilocaine (EMLA) cream Apply 1 application topically as needed. 30 g 3   Magnesium 250 MG TABS Take 250 mg by mouth daily as needed.     ondansetron (ZOFRAN) 8 MG tablet Take 1 tablet (8 mg total) by mouth every 8 (eight) hours as needed. 30 tablet 1   pantoprazole (PROTONIX) 40 MG tablet Take 1 tablet (40 mg total) by mouth daily. (Patient taking differently: Take 40 mg by mouth daily as needed.) 30 tablet 3  prochlorperazine (COMPAZINE) 10 MG tablet Take 1 tablet (10 mg total) by mouth every 6 (six) hours as needed (Nausea or vomiting). 30 tablet 1   Sulfacetamide Sodium, Acne, 10 % LOTN Apply 1 application topically at bedtime.     VITAMIN E PO Take 50 Int'l Units/day by mouth daily.     No current facility-administered  medications on file prior to visit.    Allergies:  Allergies  Allergen Reactions   Carboplatin Itching and Rash    Patient has a hypersensitivity reaction to carboplatin.  See note from 04/30/2021 at 1439.  Reaction symptoms included itching of mouth and ears, burning in chest, hypotension, and rash to neck and arms.  Patient unable to complete infusion.   Fentanyl Palpitations    Palpitations , fever ,headahe, N/V  " loud pounding heart beat"    Vancomycin Rash   Lac Bovis Diarrhea and Nausea And Vomiting   Brimonidine Tartrate     Other reaction(s): blurred vision   Ditropan [Oxybutynin]    Milk-Related Compounds Diarrhea and Nausea And Vomiting   Other Other (See Comments)   Oxybutynin Chloride Other (See Comments)    Ditropan  Pt doesn't remember    Thimerosal Other (See Comments)    Pt doesn't remember   Tramadol Other (See Comments)    Pt doesn't remember   Avelox [Moxifloxacin Hcl In Nacl] Rash   Doxycycline Rash   Erythromycin Rash   Metronidazole Rash   Minocycline Rash   Penicillins Rash    Did it involve swelling of the face/tongue/throat, SOB, or low BP? No Did it involve sudden or severe rash/hives, skin peeling, or any reaction on the inside of your mouth or nose? Yes, rash. Did you need to seek medical attention at a hospital or doctor's office? No When did it last happen?      Childhood allergy If all above answers are "NO", may proceed with cephalosporin use.    Quinolones Rash   Sulfonamide Derivatives Rash   Past Medical History:  Past Medical History:  Diagnosis Date   Allergic rhinitis    Cervical dysplasia 1980   Cervicalgia    Eczema    Rosacea,dermatitis-Dr Stinehelfer   Esophageal reflux    Family history of colon cancer    Family history of melanoma    Family history of ovarian cancer    Family history of pancreatic cancer    Family history of prostate cancer    Family history of uterine cancer    Fibromyalgia    GERD (gastroesophageal  reflux disease)    Glaucoma, narrow-angle    Heart murmur    Hiatal hernia    Irritable bowel syndrome    LBBB (left bundle branch block)    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), unspecified site    DCIS   Mass of lung    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 Arlyce Dice   Mitral valve disorders(424.0)    No antibiotics required   Osteoporosis 12/2016   T score -2.3, 2014 T score -2.5 AP spine   PAC (premature atrial contraction) 2012   ,PVC's, and nonsustained atril tachycardia w aberration by heart monitor    PVC (premature ventricular contraction)    Rosacea    Stricture and stenosis of esophagus    Past Surgical History:  Past Surgical History:  Procedure Laterality Date   APPLICATION OF CRANIAL NAVIGATION N/A 01/22/2021   Procedure: APPLICATION OF CRANIAL NAVIGATION;  Surgeon: Karsten Ro, DO;  Location: [facility];  Service:  Neurosurgery;  Laterality: N/A;   BREAST LUMPECTOMY  1998   right breast with radiation   CERVICAL DISCECTOMY     C3-7 with bone graft, in three different surgeries, have titanium plate and screws   CRANIOTOMY N/A 01/22/2021   Procedure: STEREOTACTIC RIGHT FRONTAL CRANIOTOMY FOR RESECTION OF TUMOR;  Surgeon: Karsten Ro, DO;  Location: [facility];  Service: Neurosurgery;  Laterality: N/A;   GYNECOLOGIC CRYOSURGERY  1980   IR IMAGING GUIDED PORT INSERTION  12/24/2018   NECK SURGERY  1993-1997   bone graft and fusion   OMENTECTOMY  02/17/2019   REFRACTIVE SURGERY     narrow angle glucoma   RHINOPLASTY  1976   sterotactic large core needle biopsy right breast Right 10/15/1996   TUBAL LIGATION  1977   Social History:  Social History   Socioeconomic History   Marital status: Married    Spouse name: Antony Haste   Number of children: 0   Years of education: Not on file   Highest education level: Not on file  Occupational History   Occupation: Teacher, early years/pre: [employer]  Tobacco Use   Smoking status: Never   Smokeless tobacco: Never  Vaping  Use   Vaping Use: Never used  Substance and Sexual Activity   Alcohol use: Yes    Alcohol/week: 2.0 standard drinks    Types: 2 Standard drinks or equivalent per week    Comment: Social    Drug use: No   Sexual activity: Not Currently    Birth control/protection: Post-menopausal    Comment: 1st intercourse 71 yo- More than 5 partners  Other Topics Concern   Not on file  Social History Narrative   Not on file   Social Determinants of Health   Financial Resource Strain: Not on file  Food Insecurity: Not on file  Transportation Needs: Not on file  Physical Activity: Not on file  Stress: Not on file  Social Connections: Not on file  Intimate Partner Violence: Not on file   Family History:  Family History  Problem Relation Age of Onset   Heart disease Mother    Depression Mother    Hypertension Mother    Heart disease Father    Hypertension Father    Aneurysm Father    Uterine cancer Sister 53   Hypertension Sister    Ovarian cancer Sister 15   Melanoma Sister 47   Depression Sister    Melanoma Sister 23   Heart disease Paternal Grandfather    Stroke Maternal Grandmother    Cancer Maternal Grandfather 63       Pancreatic   Stroke Paternal Grandmother    Cancer Paternal Uncle        multiple myeloma, prostate cancer and melanoma (separate primaries)   Colon cancer Neg Hx     Review of Systems: Constitutional: Doesn't report fevers, chills or abnormal weight loss Eyes: Doesn't report blurriness of vision Ears, nose, mouth, throat, and face: Doesn't report sore throat Respiratory: Doesn't report cough, dyspnea or wheezes Cardiovascular: Doesn't report palpitation, chest discomfort  Gastrointestinal:  Doesn't report nausea, constipation, diarrhea GU: Doesn't report incontinence Skin: Doesn't report skin rashes Neurological: Per HPI Musculoskeletal: Doesn't report joint pain Behavioral/Psych: Doesn't report anxiety  Physical Exam: Wt Readings from Last 3  Encounters:  08/19/21 98 lb 8 oz (44.7 kg)  08/13/21 103 lb 6.4 oz (46.9 kg)  07/30/21 101 lb 3.2 oz (45.9 kg)   Temp Readings from Last 3 Encounters:  08/19/21 97.8  F (36.6 C) (Tympanic)  08/13/21 98.1 F (36.7 C) (Tympanic)  07/30/21 97.9 F (36.6 C) (Tympanic)   BP Readings from Last 3 Encounters:  08/19/21 (!) 125/56  08/13/21 (!) 102/50  07/30/21 138/61   Pulse Readings from Last 3 Encounters:  08/19/21 93  08/13/21 75  07/30/21 78   KPS: 80. General: Alert, cooperative, pleasant, in no acute distress Head: Normal EENT: No conjunctival injection or scleral icterus.  Lungs: Resp effort normal Cardiac: Regular rate Abdomen: Non-distended abdomen Skin: No rashes cyanosis or petechiae. Extremities: No clubbing or edema  Neurologic Exam: Mental Status: Awake, alert, attentive to examiner. Oriented to self and environment. Language is fluent with intact comprehension.  Cranial Nerves: Visual acuity is grossly normal. Visual fields are full. Extra-ocular movements intact. No ptosis. Face is symmetric Motor: Tone and bulk are normal. Power is full in both arms and legs. Reflexes are symmetric, no pathologic reflexes present.  Sensory: Intact to light touch Gait: Normal.   Labs: I have reviewed the data as listed    Component Value Date/Time   NA 139 08/13/2021 0733   K 3.6 08/13/2021 0733   CL 103 08/13/2021 0733   CO2 29 08/13/2021 0733   GLUCOSE 94 08/13/2021 0733   BUN 11 08/13/2021 0733   CREATININE 0.55 08/13/2021 0733   CALCIUM 9.5 08/13/2021 0733   PROT 7.1 08/13/2021 0733   ALBUMIN 4.3 08/13/2021 0733   AST 23 08/13/2021 0733   ALT 16 08/13/2021 0733   ALKPHOS 63 08/13/2021 0733   BILITOT 0.4 08/13/2021 0733   GFRNONAA >60 08/13/2021 0733   GFRAA >60 12/05/2019 1145   Lab Results  Component Value Date   WBC 5.7 08/13/2021   NEUTROABS 3.4 08/13/2021   HGB 11.0 (L) 08/13/2021   HCT 32.7 (L) 08/13/2021   MCV 94.0 08/13/2021   PLT 225 08/13/2021     Imaging:  [facility] Clinician Interpretation: I have personally reviewed the CNS images as listed.  My interpretation, in the context of the patient's clinical presentation, is stable disease  MR BRAIN W WO CONTRAST  Result Date: 08/16/2021 CLINICAL DATA:  71 year old female with remote history of breast cancer. 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 Postoperative SRS, and also SRS to a left parietal metastasis. Persistent resection cavity enhancement in March. Restaging. EXAM: MRI HEAD WITHOUT AND WITH CONTRAST TECHNIQUE: Multiplanar, multiecho pulse sequences of the brain and surrounding structures were obtained without and with intravenous contrast. CONTRAST:  83m MULTIHANCE GADOBENATE DIMEGLUMINE 529 MG/ML IV SOLN COMPARISON:  05/16/2021 and earlier. FINDINGS: Brain: Right side craniotomy changes with right middle frontal gyrus resection cavity stable and size (series 10, image 23). Resolved marginal enhancement since March, which was nodular and along the superior margin. Unchanged hemosiderin and mild intrinsic T1 hyperintensity within the cavity. Mild surrounding T2 and FLAIR hyperintensity is stable, with no significant mass effect. Stable to regressed faint linear enhancement at the treated left parietal lesion sites series 11, image 117. No additional abnormal enhancement or new metastatic lesion identified. Stable to decreased smooth dural thickening underlying the craniotomy. No superimposed restricted diffusion to suggest acute infarction. No midline shift, mass effect, evidence of mass lesion, ventriculomegaly, or acute intracranial hemorrhage. Cervicomedullary junction and pituitary are within normal limits. Scattered additional bilateral nonspecific cerebral white matter T2 and FLAIR hyperintensity is stable. No new signal abnormality. Vascular: Major intracranial vascular flow voids are stable. The major dural venous  sinuses are enhancing and appear  to be patent. Skull and upper cervical spine: Prior cervical ACDF. Negative visible cervical spinal cord. Normal background bone marrow signal. Stable right craniotomy changes. Sinuses/Orbits: Stable and negative. Other: Mastoids remain clear. Visible internal auditory structures appear normal. IMPRESSION: 1. Regression of disease. Resolved marginal enhancement at the right middle frontal gyrus resection cavity and regressed enhancement at the treated left parietal lesion. 2. No new metastatic disease or acute intracranial abnormality. Electronically Signed   By: Genevie Ann M.D.   On: 08/16/2021 08:48   CT CHEST ABDOMEN PELVIS W CONTRAST  Result Date: 08/17/2021 CLINICAL DATA:  Follow-up ovarian carcinoma. Assess treatment response. * Tracking Code: BO * EXAM: CT CHEST, ABDOMEN, AND PELVIS WITH CONTRAST TECHNIQUE: Multidetector CT imaging of the chest, abdomen and pelvis was performed following the standard protocol during bolus administration of intravenous contrast. RADIATION DOSE REDUCTION: This exam was performed according to the departmental dose-optimization program which includes automated exposure control, adjustment of the mA and/or kV according to patient size and/or use of iterative reconstruction technique. CONTRAST:  111m OMNIPAQUE IOHEXOL 300 MG/ML  SOLN COMPARISON:  05/17/2021 FINDINGS: CT CHEST FINDINGS Cardiovascular: No acute findings. Mediastinum/Lymph Nodes: No masses or pathologically enlarged lymph nodes identified. 5 mm prevascular superior mediastinal lymph node image 15/2 shows slight decrease in size from 7 mm previously. Lungs/Pleura: No suspicious pulmonary nodules or masses identified. Chronic right middle lobe atelectasis remains stable. No evidence of central endobronchial obstruction. No evidence of infiltrate or pleural effusion. Musculoskeletal:  No suspicious bone lesions identified. CT ABDOMEN AND PELVIS FINDINGS Hepatobiliary: No masses  identified. Gallbladder is unremarkable. No evidence of biliary ductal dilatation. Pancreas:  No mass or inflammatory changes. Spleen:  Within normal limits in size and appearance. Adrenals/Urinary tract:  No masses or hydronephrosis. Stomach/Bowel: No evidence of obstruction, inflammatory process, or abnormal fluid collections. Normal appendix visualized. Vascular/Lymphatic: Left paraaortic retroperitoneal lymph node measures 1.4 x 0.9 cm on image 64/2, compared to 1.5 x 1.0 cm previously. No other pathologically enlarged lymph nodes identified. No acute vascular findings. Aortic atherosclerotic calcification incidentally noted. Reproductive: Prior hysterectomy noted. Adnexal regions are unremarkable in appearance. Other:  No evidence peritoneal thickening, nodularity, or ascites. Musculoskeletal:  No suspicious bone lesions identified. IMPRESSION: Slight decrease in size of small left retroperitoneal and superior mediastinal lymph nodes. No new or progressive disease within the chest, abdomen, or pelvis. Aortic Atherosclerosis (ICD10-I70.0). Electronically Signed   By: JMarlaine HindM.D.   On: 08/17/2021 12:22     Assessment/Plan Malignant neoplasm metastatic to brain ([facility]  [place]iz[place] ([place]  [name] CICCARELLIis clinically and radiographically stable today.  Given minimal quality of life intrusion with small focal seizures, should continue Keppra 10070mBID for now.  She will meet tomorrow with Dr. GoAlvy Bimlero decide on systemic therapy moving forward.  We appreciate the opportunity to participate in the care of JaHADAR ELGERSMA We ask that JaKIMBLEY SPRAGUEeturn to clinic in 3 months following next brain MRI, or sooner as needed.  All questions were answered. The patient knows to call the clinic with any problems, questions or concerns. No barriers to learning were detected.  The total time spent in the encounter was 30 minutes and more than 50% was on counseling and review of test  results   ZaVentura SellersMD Medical Director of Neuro-Oncology CoHouston [facility] We[facility]6/05/23 9:30 AM

## 2021-08-20 ENCOUNTER — Inpatient Hospital Stay: Payer: Medicare Other

## 2021-08-20 ENCOUNTER — Encounter: Payer: Self-pay | Admitting: Hematology and Oncology

## 2021-08-20 ENCOUNTER — Other Ambulatory Visit (HOSPITAL_COMMUNITY): Payer: Self-pay

## 2021-08-20 ENCOUNTER — Inpatient Hospital Stay (HOSPITAL_BASED_OUTPATIENT_CLINIC_OR_DEPARTMENT_OTHER): Payer: Medicare Other | Admitting: Hematology and Oncology

## 2021-08-20 DIAGNOSIS — Z7189 Other specified counseling: Secondary | ICD-10-CM

## 2021-08-20 DIAGNOSIS — R569 Unspecified convulsions: Secondary | ICD-10-CM | POA: Diagnosis not present

## 2021-08-20 DIAGNOSIS — C7931 Secondary malignant neoplasm of brain: Secondary | ICD-10-CM | POA: Diagnosis not present

## 2021-08-20 DIAGNOSIS — C482 Malignant neoplasm of peritoneum, unspecified: Secondary | ICD-10-CM

## 2021-08-20 DIAGNOSIS — D61818 Other pancytopenia: Secondary | ICD-10-CM | POA: Diagnosis not present

## 2021-08-20 DIAGNOSIS — D61810 Antineoplastic chemotherapy induced pancytopenia: Secondary | ICD-10-CM | POA: Diagnosis not present

## 2021-08-20 DIAGNOSIS — Z452 Encounter for adjustment and management of vascular access device: Secondary | ICD-10-CM | POA: Diagnosis not present

## 2021-08-20 DIAGNOSIS — S2242XA Multiple fractures of ribs, left side, initial encounter for closed fracture: Secondary | ICD-10-CM | POA: Diagnosis not present

## 2021-08-20 LAB — CBC WITH DIFFERENTIAL (CANCER CENTER ONLY)
Abs Immature Granulocytes: 0.01 10*3/uL (ref 0.00–0.07)
Basophils Absolute: 0 10*3/uL (ref 0.0–0.1)
Basophils Relative: 1 %
Eosinophils Absolute: 0 10*3/uL (ref 0.0–0.5)
Eosinophils Relative: 1 %
HCT: 30 % — ABNORMAL LOW (ref 36.0–46.0)
Hemoglobin: 10.3 g/dL — ABNORMAL LOW (ref 12.0–15.0)
Immature Granulocytes: 0 %
Lymphocytes Relative: 37 %
Lymphs Abs: 1.1 10*3/uL (ref 0.7–4.0)
MCH: 32.1 pg (ref 26.0–34.0)
MCHC: 34.3 g/dL (ref 30.0–36.0)
MCV: 93.5 fL (ref 80.0–100.0)
Monocytes Absolute: 0.4 10*3/uL (ref 0.1–1.0)
Monocytes Relative: 12 %
Neutro Abs: 1.5 10*3/uL — ABNORMAL LOW (ref 1.7–7.7)
Neutrophils Relative %: 49 %
Platelet Count: 206 10*3/uL (ref 150–400)
RBC: 3.21 MIL/uL — ABNORMAL LOW (ref 3.87–5.11)
RDW: 13.4 % (ref 11.5–15.5)
Smear Review: NORMAL
WBC Count: 3 10*3/uL — ABNORMAL LOW (ref 4.0–10.5)
nRBC: 0 % (ref 0.0–0.2)

## 2021-08-20 LAB — CMP (CANCER CENTER ONLY)
ALT: 26 U/L (ref 0–44)
AST: 27 U/L (ref 15–41)
Albumin: 4.3 g/dL (ref 3.5–5.0)
Alkaline Phosphatase: 72 U/L (ref 38–126)
Anion gap: 8 (ref 5–15)
BUN: 14 mg/dL (ref 8–23)
CO2: 29 mmol/L (ref 22–32)
Calcium: 9.7 mg/dL (ref 8.9–10.3)
Chloride: 100 mmol/L (ref 98–111)
Creatinine: 0.51 mg/dL (ref 0.44–1.00)
GFR, Estimated: >60 mL/min (ref >60)
Glucose, Bld: 100 mg/dL — ABNORMAL HIGH (ref 70–99)
Potassium: 3.9 mmol/L (ref 3.5–5.1)
Sodium: 137 mmol/L (ref 135–145)
Total Bilirubin: 0.3 mg/dL (ref 0.3–1.2)
Total Protein: 7.6 g/dL (ref 6.5–8.1)

## 2021-08-20 MED ORDER — SODIUM CHLORIDE 0.9% FLUSH
10.0000 mL | Freq: Once | INTRAVENOUS | Status: AC
  Administered 2021-08-20: 10 mL

## 2021-08-20 MED ORDER — LIDOCAINE-PRILOCAINE 2.5-2.5 % EX CREA
1.0000 "application " | TOPICAL_CREAM | CUTANEOUS | 3 refills | Status: DC | PRN
  Filled 2021-08-20: qty 30, 30d supply, fill #0
  Filled 2022-02-19: qty 30, 30d supply, fill #1

## 2021-08-20 NOTE — Patient Outreach (Signed)
[facility]) Care Management  08/20/2021  [name] [name] March 12, 1951 [id number]   Received referral for Care Management from Insurance plan. Assigned patient to Joellyn Quails, RN Care Coordinator for follow up.   [place] Management Assistant [phone]

## 2021-08-20 NOTE — Assessment & Plan Note (Signed)
She has severe pancytopenia with each cycle of treatment and she is profoundly symptomatic She does not need transfusion support I anticipate her pancytopenia will resolve in her next visit

## 2021-08-20 NOTE — Assessment & Plan Note (Signed)
Her recent MRI of the brain showed no evidence of disease She will continue follow-up with neuro oncologist

## 2021-08-20 NOTE — Assessment & Plan Note (Signed)
I have reviewed multiple CT imaging with the patient She has good response to treatment Residual active disease cannot be excluded but overall it is showing favorable response The patient has significant toxicities and side effects with chemotherapy with gemcitabine We discussed different pathway including taking a treatment break versus continuing gemcitabine with dose modification versus switching to other form of maintenance treatment such as niraparib or bevacizumab Ultimately, the patient would like to take a treatment break which I think is reasonable I plan to see her again in 6 weeks for further follow-up and repeat blood work and port flush I plan to repeat imaging study in 3 months for further follow-up

## 2021-08-20 NOTE — Progress Notes (Signed)
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[facility]) I have reviewed multiple CT imaging with the patient She has good response to treatment Residual active disease cannot be excluded but overall it is showing favorable response The patient has significant toxicities and side effects with chemotherapy with gemcitabine We discussed different pathway including taking a treatment break versus continuing gemcitabine with dose modification versus switching to other form of maintenance treatment such as niraparib or bevacizumab Ultimately, the patient would like to take a treatment break which I think is reasonable I plan to see her again in 6 weeks for further follow-up and repeat blood work and port flush I plan to repeat imaging study in 3 months for further follow-up  Malignant neoplasm metastatic to brain [facility]) Her recent MRI of the brain showed no evidence of disease She will continue follow-up with neuro oncologist  Pancytopenia, acquired [facility]) She has severe pancytopenia with each cycle of treatment and she is profoundly symptomatic She does not need transfusion support I anticipate her pancytopenia will resolve in her next visit  Goals of care, counseling/discussion We had extensive discussions about goals of care Ultimately, we are in agreement for close observation and surveillance follow-up with imaging study every 3 months   No orders of the defined types were placed in this encounter.   All questions were answered. The patient knows to call the clinic with any problems, questions or concerns. The total time spent in the appointment was 40 minutes encounter with patients including review of chart and various tests results, discussions about  plan of care and coordination of care plan   [name], MD 08/20/2021 2:41 PM  INTERVAL HISTORY: Please see below for problem oriented charting. she returns for treatment follow-up She has profound symptomatic fatigue, bone pain and other similar symptoms with recent cycle of chemotherapy She have lost some weight since last time I saw her She had good visit with neuro oncologist with negative MRI of the brain She is anxious about decision making but is leaning towards taking a break from treatment due to numerous complications and side effects from chemotherapy We spent majority of our time discussing results of imaging study and next step  REVIEW OF SYSTEMS:    All other systems were reviewed with the patient and are negative.  I have reviewed the past medical history, past surgical history, social history and family history with the patient and they are unchanged from previous note.  ALLERGIES:  is allergic to carboplatin, fentanyl, vancomycin, lac bovis, brimonidine tartrate, ditropan [oxybutynin], milk-related compounds, other, oxybutynin chloride, thimerosal, tramadol, avelox [moxifloxacin hcl in nacl], doxycycline, erythromycin, metronidazole, minocycline, penicillins, quinolones, and sulfonamide derivatives.  MEDICATIONS:  Current Outpatient Medications  Medication Sig Dispense Refill   Azelaic Acid 15 % cream Apply 1 application topically daily.     cholecalciferol (VITAMIN D3) 25 MCG (1000 UNIT) tablet Take 2,000 Units by mouth daily.     gabapentin (NEURONTIN) 300 MG capsule Take 300-600 mg by mouth See admin instructions. Taking 600 mg in am ;300 mg at lunch; 300 mg at hs.     levETIRAcetam (KEPPRA) 500 MG tablet Take 2 tablets (1,000 mg total) by mouth 2 (two) times daily. 120 tablet 3   lidocaine-prilocaine (EMLA) cream Apply to affected area(s) as  needed. 30 g 3   Magnesium 250 MG TABS Take 250 mg by mouth daily as needed.     ondansetron (ZOFRAN) 8 MG tablet Take 1  tablet (8 mg total) by mouth every 8 (eight) hours as needed. 30 tablet 1   pantoprazole (PROTONIX) 40 MG tablet Take 1 tablet (40 mg total) by mouth daily. (Patient taking differently: Take 40 mg by mouth daily as needed.) 30 tablet 3   prochlorperazine (COMPAZINE) 10 MG tablet Take 1 tablet (10 mg total) by mouth every 6 (six) hours as needed (Nausea or vomiting). 30 tablet 1   Sulfacetamide Sodium, Acne, 10 % LOTN Apply 1 application topically at bedtime.     VITAMIN E PO Take 50 Int'l Units/day by mouth daily.     No current facility-administered medications for this visit.    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 Findings highly suspicious for peritoneal carcinomatosis. Recommend paracentesis for therapeutic and diagnostic purposes. I do not see an obvious primary lesion but there is some irregular enhancing soft tissue in the right adnexal area. CA 125 level may be helpful. 2. Mild surface irregularity involving the liver but I do not see any obvious changes of cirrhosis. No hepatic lesions.   11/23/2018 Tumor Marker   Patient's tumor was tested for the following markers: CA-125 Results of the tumor marker test revealed 253.   11/25/2018 Initial Diagnosis   Peritoneal carcinoma ([place])    11/25/2018 Imaging   US paracentesis Successful ultrasound-guided paracentesis yielding 3.4 L of peritoneal fluid.   12/01/2018 Cancer Staging   Staging form: Ovary, Fallopian Tube, and Primary Peritoneal Carcinoma, AJCC 8th Edition - Clinical stage from 12/01/2018: Stage IVA (rcT3, cN0, pM1a) - Signed by [name], MD on 02/14/2021 Stage prefix: Recurrence    12/02/2018 Tumor Marker   Patient's tumor was tested for the following markers: CA-125 Results of the tumor marker test revealed  267   12/06/2018 - 05/16/2019 Chemotherapy   The patient had carboplatin and taxol for chemotherapy treatment.     12/08/2018 Procedure   Successful ultrasound-guided therapeutic paracentesis yielding 1.7 liters of peritoneal fluid.   12/24/2018 Procedure   Successful placement of a right IJ approach Power Port with ultrasound and fluoroscopic guidance. The catheter is ready for use   01/17/2019 Tumor Marker   Patient's tumor was tested for the following markers: CA-125 Results of the tumor marker test revealed 57.2   01/31/2019 Imaging   Significant decrease in peritoneal carcinomatosis since previous study. Interval resolution of ascites.   Focal area of parenchymal consolidation in central right middle lobe, which measures 3 cm. Differential diagnosis includes infectious or inflammatory process, atelectasis, and neoplasm. Recommend short-term follow-up by chest CT in 2-3 months.     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   Surgeon: Donaciano Eva    Operative Findings:  : grossly normal uterus, ovaries normal, few scattered peritoneal nodules (83m) on serosa of uterus and tubes. The omentum (gastrocolic) was tethered to the mesentery with tumor (thin rind). Complete (optimal) resection of tumor with no gross residual disease.     02/17/2019 Pathology  Results   FINAL MICROSCOPIC DIAGNOSIS: A. UTERUS, CERVIX, BILATERAL FALLOPIAN TUBES AND OVARIES, HYSTERECTOMY WITH SALPINGOOOPHORECTOMY: - Uterus: Endometrium: Inactive endometrium. No hyperplasia or malignancy. Myometrium: Unremarkable. No malignancy. Serosa: Metastatic carcinoma. No malignancy. - Cervix: Benign squamous and endocervical mucosa. No dysplasia or malignancy. - Left  ovary and fallopian tube: Metastatic carcinoma. - Right ovary: No malignancy identified. - Right fallopian tube: Luminal tumor, see comment. B. OMENTUM, RESECTION: - High grade serous carcinoma. - Deposits up to at least 3 cm. - See oncology table. ONCOLOGY TABLE: OVARY or FALLOPIAN TUBE or PRIMARY PERITONEUM: Procedure: Hysterectomy with bilateral salpingo-oophorectomy and omentectomy. Specimen Integrity: Intact Tumor Site: Peritoneum Ovarian Surface Involvement (required only if applicable): Left ovary Fallopian Tube Surface Involvement (required only if applicable): Left Fallopian tube Tumor Size: Largest deposit 3 cm Histologic Type: High-grade serous carcinoma Histologic Grade: High-grade Implants (required for advanced stage serous/seromucinous borderline tumors only): Uterine serosa, left fallopian tube and ovary, omentum Other Tissue/ Organ Involvement: As above Largest Extrapelvic Peritoneal Focus (required only if applicable): 3 cm Peritoneal/Ascitic Fluid: Pre neoadjuvant [id number] positive for carcinoma. Treatment Effect (required only for high-grade serous carcinomas): Probably partial treatment effect in omental tissue. Regional Lymph Nodes: No lymph nodes submitted or found Pathologic Stage Classification (pTNM, AJCC 8th Edition): ypT3c, ypNX Representative Tumor Block: B5 Comment(s): There is surface involvement of the left ovary and fallopian tube but no primary tumor. Within the right fallopian tube lumen there is detached fragments of tumor but again no precursor lesion is noted in the right fallopian tube. Thus, the tumor is presumed primary peritoneal and staged as such.   03/21/2019 Tumor Marker   Patient's tumor was tested for the following markers: CA-125 Results of the tumor marker test revealed 15.1    Genetic Testing   Negative genetic testing. No pathogenic variants identified on the [employer] CancerNext + RNAinsight panel. VUS in ATM called c.6007G>A identified.  The report date is 05/12/2019. TumorNext HRD was originally ordered but there was not enough sample to complete this testing.   The CancerNext+RNAinsight gene panel offered by [employer] includes sequencing and rearrangement analysis for the following 36 genes: APC*, ATM*, AXIN2, BARD1, BMPR1A, BRCA1*, BRCA2*, BRIP1*, CDH1*, CDK4, CDKN2A, CHEK2*, DICER1, MLH1*, MSH2*, MSH3, MSH6*, MUTYH*, NBN, NF1*, NTHL1, PALB2*, PMS2*, PTEN*, RAD51C*, RAD51D*, RECQL, SMAD4, SMARCA4, STK11 and TP53* (sequencing and deletion/duplication); HOXB13, POLD1 and POLE (sequencing only); EPCAM and GREM1 (deletion/duplication only). DNA and RNA analyses performed for * genes.    05/16/2019 Tumor Marker   Patient's tumor was tested for the following markers: CA-125 Results of the tumor marker test revealed 10.7   06/16/2019 Imaging   1. No evidence of residual or recurrent metastatic disease in the chest, abdomen or pelvis. 2. Masslike focus of consolidation in the right middle lobe along the major and minor fissures with associated volume loss is stable since 01/31/2019. Indolent primary pulmonary neoplasm not excluded. PET-CT may be considered for further characterization. 3. Aortic Atherosclerosis (ICD10-I70.0).   06/16/2019 Tumor Marker   Patient's tumor was tested for the following markers: CA-125 Results of the tumor marker test revealed 9.1.   12/05/2019 Tumor Marker   Patient's tumor was tested for the following markers: CA-125. Results of the tumor marker test revealed 9.0.   02/29/2020 Tumor Marker   Patient's tumor was tested for the following markers: CA-`125 Results of the tumor marker test revealed 9.7.   05/28/2020 Tumor Marker   Patient's tumor was tested for the following markers: CA-125 Results of the tumor marker  test revealed 9.5   08/16/2020 Tumor Marker   Patient's tumor was tested for the following markers: CA-125 Results of the tumor marker test revealed 10.9   11/29/2020 Tumor Marker    Patient's tumor was tested for the following markers: CA-125. Results of the tumor marker test revealed 10.2.   01/22/2021 Pathology Results   SURGICAL PATHOLOGY  CASE: [phone]  PATIENT: Sampson Goon  Surgical Pathology Report   Clinical History: brain metastasis (cm)    FINAL MICROSCOPIC DIAGNOSIS:   A. 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    03/20/2021 Imaging   1. Unchanged enlarged midline superior mediastinal and left retroperitoneal lymph nodes, which remain suspicious for metastatic disease. 2. Unchanged masslike consolidation and volume loss of the right middle lobe measuring 3.8 x 1.9 cm, likely chronic scarring. Continued attention on follow-up. 3. No new evidence of metastatic disease in the chest, abdomen, or pelvis. 4. Status post hysterectomy and omentectomy.   Aortic Atherosclerosis (ICD10-I70.0).     03/21/2021 - 05/07/2021 Chemotherapy   Patient is on Treatment Plan : OVARIAN RECURRENT 3RD LINE Carboplatin D1 / Gemcitabine D1,8 (4/800) q21d      05/17/2021 Imaging   1. No new suspicious mass or lymphadenopathy identified in the chest, abdomen or pelvis. 2. Interval decreased size of previous enlarged superior mediastinal and left retroperitoneal lymph nodes. 3. Other ancillary findings as described.   05/21/2021 - 08/13/2021 Chemotherapy   Patient is on Treatment Plan : OVARIAN Gemcitabine D1,8 q21d       08/16/2021 Imaging   MRI brain  1. Regression of disease. Resolved marginal enhancement at the right middle frontal gyrus resection cavity and regressed enhancement at the treated left parietal lesion.   2. No new metastatic disease or acute intracranial abnormality.   08/19/2021 Imaging   Slight decrease in size of small left retroperitoneal and superior mediastinal lymph nodes.   No new or progressive disease within  the chest, abdomen, or pelvis.   Aortic Atherosclerosis (ICD10-I70.0).       PHYSICAL EXAMINATION: ECOG PERFORMANCE STATUS: 1 - Symptomatic but completely ambulatory  Vitals:   08/20/21 0814  BP: (!) 124/54  Pulse: 81  Resp: 18  Temp: (!) 97.5 F (36.4 C)  SpO2: 100%   Filed Weights   08/20/21 0814  Weight: 99 lb 12.8 oz (45.3 kg)    GENERAL:alert, no distress and comfortable NEURO: alert & oriented x 3 with fluent speech, no focal motor/sensory deficits  LABORATORY DATA:  I have reviewed the data as listed    Component Value Date/Time   NA 137 08/20/2021 0748   K 3.9 08/20/2021 0748   CL 100 08/20/2021 0748   CO2 29 08/20/2021 0748   GLUCOSE 100 (H) 08/20/2021 0748   BUN 14 08/20/2021 0748   CREATININE 0.51 08/20/2021 0748   CALCIUM 9.7 08/20/2021 0748   PROT 7.6 08/20/2021 0748   ALBUMIN 4.3 08/20/2021 0748   AST 27 08/20/2021 0748   ALT 26 08/20/2021 0748   ALKPHOS 72 08/20/2021 0748   BILITOT 0.3 08/20/2021 0748   GFRNONAA >60 08/20/2021 0748   GFRAA >60 12/05/2019 1145    No results found for: SPEP, UPEP  Lab Results  Component Value Date   WBC 3.0 (L) 08/20/2021   NEUTROABS 1.5 (L) 08/20/2021   HGB 10.3 (L) 08/20/2021   HCT 30.0 (L) 08/20/2021   MCV  93.5 08/20/2021   PLT 206 08/20/2021      Chemistry      Component Value Date/Time   NA 137 08/20/2021 0748   K 3.9 08/20/2021 0748   CL 100 08/20/2021 0748   CO2 29 08/20/2021 0748   BUN 14 08/20/2021 0748   CREATININE 0.51 08/20/2021 0748      Component Value Date/Time   CALCIUM 9.7 08/20/2021 0748   ALKPHOS 72 08/20/2021 0748   AST 27 08/20/2021 0748   ALT 26 08/20/2021 0748   BILITOT 0.3 08/20/2021 0748       RADIOGRAPHIC STUDIES: I have reviewed multiple CT imaging with the patient I have personally reviewed the radiological images as listed and agreed with the findings in the report. MR BRAIN W WO CONTRAST  Result Date: 08/16/2021 CLINICAL DATA:  71 year old female with remote  history of breast cancer. 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 Postoperative SRS, and also SRS to a left parietal metastasis. Persistent resection cavity enhancement in March. Restaging. EXAM: MRI HEAD WITHOUT AND WITH CONTRAST TECHNIQUE: Multiplanar, multiecho pulse sequences of the brain and surrounding structures were obtained without and with intravenous contrast. CONTRAST:  48m MULTIHANCE GADOBENATE DIMEGLUMINE 529 MG/ML IV SOLN COMPARISON:  05/16/2021 and earlier. FINDINGS: Brain: Right side craniotomy changes with right middle frontal gyrus resection cavity stable and size (series 10, image 23). Resolved marginal enhancement since March, which was nodular and along the superior margin. Unchanged hemosiderin and mild intrinsic T1 hyperintensity within the cavity. Mild surrounding T2 and FLAIR hyperintensity is stable, with no significant mass effect. Stable to regressed faint linear enhancement at the treated left parietal lesion sites series 11, image 117. No additional abnormal enhancement or new metastatic lesion identified. Stable to decreased smooth dural thickening underlying the craniotomy. No superimposed restricted diffusion to suggest acute infarction. No midline shift, mass effect, evidence of mass lesion, ventriculomegaly, or acute intracranial hemorrhage. Cervicomedullary junction and pituitary are within normal limits. Scattered additional bilateral nonspecific cerebral white matter T2 and FLAIR hyperintensity is stable. No new signal abnormality. Vascular: Major intracranial vascular flow voids are stable. The major dural venous sinuses are enhancing and appear to be patent. Skull and upper cervical spine: Prior cervical ACDF. Negative visible cervical spinal cord. Normal background bone marrow signal. Stable right craniotomy changes. Sinuses/Orbits: Stable and negative. Other: Mastoids remain clear. Visible internal  auditory structures appear normal. IMPRESSION: 1. Regression of disease. Resolved marginal enhancement at the right middle frontal gyrus resection cavity and regressed enhancement at the treated left parietal lesion. 2. No new metastatic disease or acute intracranial abnormality. Electronically Signed   By: HGenevie AnnM.D.   On: 08/16/2021 08:48   CT CHEST ABDOMEN PELVIS W CONTRAST  Result Date: 08/17/2021 CLINICAL DATA:  Follow-up ovarian carcinoma. Assess treatment response. * Tracking Code: BO * EXAM: CT CHEST, ABDOMEN, AND PELVIS WITH CONTRAST TECHNIQUE: Multidetector CT imaging of the chest, abdomen and pelvis was performed following the standard protocol during bolus administration of intravenous contrast. RADIATION DOSE REDUCTION: This exam was performed according to the departmental dose-optimization program which includes automated exposure control, adjustment of the mA and/or kV according to patient size and/or use of iterative reconstruction technique. CONTRAST:  1064mOMNIPAQUE IOHEXOL 300 MG/ML  SOLN COMPARISON:  05/17/2021 FINDINGS: CT CHEST FINDINGS Cardiovascular: No acute findings. Mediastinum/Lymph Nodes: No masses or pathologically enlarged lymph nodes identified. 5 mm prevascular superior mediastinal lymph node image 15/2 shows slight decrease in size from  7 mm previously. Lungs/Pleura: No suspicious pulmonary nodules or masses identified. Chronic right middle lobe atelectasis remains stable. No evidence of central endobronchial obstruction. No evidence of infiltrate or pleural effusion. Musculoskeletal:  No suspicious bone lesions identified. CT ABDOMEN AND PELVIS FINDINGS Hepatobiliary: No masses identified. Gallbladder is unremarkable. No evidence of biliary ductal dilatation. Pancreas:  No mass or inflammatory changes. Spleen:  Within normal limits in size and appearance. Adrenals/Urinary tract:  No masses or hydronephrosis. Stomach/Bowel: No evidence of obstruction, inflammatory process, or  abnormal fluid collections. Normal appendix visualized. Vascular/Lymphatic: Left paraaortic retroperitoneal lymph node measures 1.4 x 0.9 cm on image 64/2, compared to 1.5 x 1.0 cm previously. No other pathologically enlarged lymph nodes identified. No acute vascular findings. Aortic atherosclerotic calcification incidentally noted. Reproductive: Prior hysterectomy noted. Adnexal regions are unremarkable in appearance. Other:  No evidence peritoneal thickening, nodularity, or ascites. Musculoskeletal:  No suspicious bone lesions identified. IMPRESSION: Slight decrease in size of small left retroperitoneal and superior mediastinal lymph nodes. No new or progressive disease within the chest, abdomen, or pelvis. Aortic Atherosclerosis (ICD10-I70.0). Electronically Signed   By: Marlaine Hind M.D.   On: 08/17/2021 12:22

## 2021-08-20 NOTE — Assessment & Plan Note (Signed)
We had extensive discussions about goals of care Ultimately, we are in agreement for close observation and surveillance follow-up with imaging study every 3 months

## 2021-08-21 ENCOUNTER — Other Ambulatory Visit: Payer: Self-pay | Admitting: Radiation Therapy

## 2021-08-21 ENCOUNTER — Telehealth: Payer: Self-pay

## 2021-08-21 NOTE — Telephone Encounter (Signed)
Ms Strubel noticed in My Chart that her visit with Dr. Mickeal Skinner was on 11-25-21.  She wants to speak with Maudie Mercury regarding timing of appointments as she has plans from 9-7 through 11-25-21.  Dr, Mickeal Skinner also wants to review MRI with Tumor board before a follow up visit.  Told her that Maudie Mercury would know if the Tumor board was weekly or every 2 weeks. Last Brain MRI was 08-15-21 so the earliest she can have it is 11-16-21 if her insurance will only cover MRI q 3 months. Ms Reach is willing to have scan prior to 9-7 and see Dr. Mickeal Skinner after 11-25-21. If she had to she would be in town to keep the 11-25-21 visit if necessary. She does not want the 11-25-21 appointment changed until she speaks with Maudie Mercury. Told Ms Greb that this message would be sent to Maudie Mercury to review and call you back in the next couple of days to discuss.  Please try her home phone first as the connection is better then her mobile line.

## 2021-08-22 ENCOUNTER — Other Ambulatory Visit (HOSPITAL_COMMUNITY): Payer: Self-pay

## 2021-09-04 DIAGNOSIS — C7931 Secondary malignant neoplasm of brain: Secondary | ICD-10-CM | POA: Diagnosis not present

## 2021-09-06 ENCOUNTER — Other Ambulatory Visit: Payer: Self-pay | Admitting: *Deleted

## 2021-09-06 DIAGNOSIS — R911 Solitary pulmonary nodule: Secondary | ICD-10-CM | POA: Insufficient documentation

## 2021-09-06 DIAGNOSIS — I700 Atherosclerosis of aorta: Secondary | ICD-10-CM | POA: Insufficient documentation

## 2021-09-06 DIAGNOSIS — M5090 Cervical disc disorder, unspecified, unspecified cervical region: Secondary | ICD-10-CM | POA: Insufficient documentation

## 2021-09-06 DIAGNOSIS — K5909 Other constipation: Secondary | ICD-10-CM | POA: Insufficient documentation

## 2021-09-06 DIAGNOSIS — F341 Dysthymic disorder: Secondary | ICD-10-CM | POA: Insufficient documentation

## 2021-09-06 DIAGNOSIS — K5900 Constipation, unspecified: Secondary | ICD-10-CM | POA: Insufficient documentation

## 2021-09-06 DIAGNOSIS — J329 Chronic sinusitis, unspecified: Secondary | ICD-10-CM | POA: Insufficient documentation

## 2021-09-06 DIAGNOSIS — J309 Allergic rhinitis, unspecified: Secondary | ICD-10-CM | POA: Insufficient documentation

## 2021-09-06 DIAGNOSIS — M533 Sacrococcygeal disorders, not elsewhere classified: Secondary | ICD-10-CM | POA: Insufficient documentation

## 2021-09-06 DIAGNOSIS — M810 Age-related osteoporosis without current pathological fracture: Secondary | ICD-10-CM | POA: Insufficient documentation

## 2021-09-06 DIAGNOSIS — M25552 Pain in left hip: Secondary | ICD-10-CM | POA: Insufficient documentation

## 2021-09-09 ENCOUNTER — Other Ambulatory Visit: Payer: Medicare Other

## 2021-09-10 DIAGNOSIS — M797 Fibromyalgia: Secondary | ICD-10-CM | POA: Diagnosis not present

## 2021-09-10 DIAGNOSIS — R42 Dizziness and giddiness: Secondary | ICD-10-CM | POA: Diagnosis not present

## 2021-09-10 DIAGNOSIS — C7931 Secondary malignant neoplasm of brain: Secondary | ICD-10-CM | POA: Diagnosis not present

## 2021-09-10 DIAGNOSIS — C482 Malignant neoplasm of peritoneum, unspecified: Secondary | ICD-10-CM | POA: Diagnosis not present

## 2021-09-12 ENCOUNTER — Other Ambulatory Visit: Payer: Self-pay

## 2021-09-12 ENCOUNTER — Other Ambulatory Visit: Payer: Self-pay | Admitting: Internal Medicine

## 2021-09-12 ENCOUNTER — Inpatient Hospital Stay (HOSPITAL_BASED_OUTPATIENT_CLINIC_OR_DEPARTMENT_OTHER): Payer: Medicare Other | Admitting: Physician Assistant

## 2021-09-12 ENCOUNTER — Telehealth: Payer: Self-pay

## 2021-09-12 ENCOUNTER — Ambulatory Visit (HOSPITAL_COMMUNITY)
Admission: RE | Admit: 2021-09-12 | Discharge: 2021-09-12 | Disposition: A | Discharge status: Home / Self Care | Payer: Medicare Other | Source: Ambulatory Visit | Attending: Physician Assistant | Admitting: Physician Assistant

## 2021-09-12 VITALS — BP 142/85 | HR 96 | Temp 98.1°F | Resp 17 | Ht 65.0 in | Wt 100.4 lb

## 2021-09-12 DIAGNOSIS — Z8679 Personal history of other diseases of the circulatory system: Secondary | ICD-10-CM | POA: Insufficient documentation

## 2021-09-12 DIAGNOSIS — J9811 Atelectasis: Secondary | ICD-10-CM | POA: Diagnosis not present

## 2021-09-12 DIAGNOSIS — D61810 Antineoplastic chemotherapy induced pancytopenia: Secondary | ICD-10-CM | POA: Diagnosis not present

## 2021-09-12 DIAGNOSIS — S2242XA Multiple fractures of ribs, left side, initial encounter for closed fracture: Secondary | ICD-10-CM | POA: Diagnosis not present

## 2021-09-12 DIAGNOSIS — W19XXXA Unspecified fall, initial encounter: Secondary | ICD-10-CM

## 2021-09-12 DIAGNOSIS — S20222A Contusion of left back wall of thorax, initial encounter: Secondary | ICD-10-CM | POA: Insufficient documentation

## 2021-09-12 DIAGNOSIS — R569 Unspecified convulsions: Secondary | ICD-10-CM | POA: Diagnosis not present

## 2021-09-12 DIAGNOSIS — S4992XA Unspecified injury of left shoulder and upper arm, initial encounter: Secondary | ICD-10-CM | POA: Diagnosis not present

## 2021-09-12 DIAGNOSIS — Z452 Encounter for adjustment and management of vascular access device: Secondary | ICD-10-CM | POA: Diagnosis not present

## 2021-09-12 DIAGNOSIS — Z8709 Personal history of other diseases of the respiratory system: Secondary | ICD-10-CM | POA: Insufficient documentation

## 2021-09-12 DIAGNOSIS — C7931 Secondary malignant neoplasm of brain: Secondary | ICD-10-CM | POA: Diagnosis not present

## 2021-09-12 DIAGNOSIS — M25512 Pain in left shoulder: Secondary | ICD-10-CM | POA: Insufficient documentation

## 2021-09-12 DIAGNOSIS — Z853 Personal history of malignant neoplasm of breast: Secondary | ICD-10-CM | POA: Insufficient documentation

## 2021-09-12 DIAGNOSIS — C482 Malignant neoplasm of peritoneum, unspecified: Secondary | ICD-10-CM | POA: Diagnosis not present

## 2021-09-12 DIAGNOSIS — R0781 Pleurodynia: Secondary | ICD-10-CM | POA: Insufficient documentation

## 2021-09-12 NOTE — Telephone Encounter (Signed)
Basically she needs a trauma evaluation. In case you do not know, I am so overbooked today I would not have time to eat lunch So either she goes to [facility] for eval or go to ER for fall eval

## 2021-09-12 NOTE — Telephone Encounter (Signed)
Return call to pt, pt states she fell while cleaning her bathroom/shower on 6/25 and was instructed to notify you.  Pt has not been evaluated since fall but reports severe pain under left scapula that she describes as "stabbing like a knife", pain with deep breathing, discomfort with repositioning and inability to sleep due to such.  No visible bruising.  Tylenol taken with no relief but pt does not want narcotics for pain.    Would you like for pt to have labs/xray?

## 2021-09-12 NOTE — Progress Notes (Signed)
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 Findings highly suspicious for peritoneal carcinomatosis. Recommend paracentesis for therapeutic and diagnostic purposes. I do not see an obvious primary lesion but there is some irregular enhancing soft tissue in the right adnexal area. CA 125 level may be helpful. 2. Mild surface irregularity involving the liver but I do not see any obvious changes of cirrhosis. No hepatic lesions.   11/23/2018 Tumor Marker   Patient's tumor was tested for the following markers: CA-125 Results of the tumor marker test revealed 253.   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   12/01/2018 Cancer Staging   Staging form: Ovary, Fallopian Tube, and Primary Peritoneal Carcinoma, AJCC 8th Edition - Clinical stage from 12/01/2018: Stage IVA (rcT3, cN0, pM1a) - Signed by [name], MD on 02/14/2021 Stage prefix: Recurrence   12/02/2018 Tumor Marker   Patient's tumor was tested for the following markers: CA-125 Results of the tumor marker test revealed 267   12/06/2018 - 05/16/2019 Chemotherapy   The patient had carboplatin and taxol for chemotherapy treatment.     12/08/2018 Procedure   Successful ultrasound-guided therapeutic paracentesis yielding 1.7 liters of peritoneal fluid.   12/24/2018 Procedure   Successful placement  of a right IJ approach Power Port with ultrasound and fluoroscopic guidance. The catheter is ready for use   01/17/2019 Tumor Marker   Patient's tumor was tested for the following markers: CA-125 Results of the tumor marker test revealed 57.2   01/31/2019 Imaging   Significant decrease in peritoneal carcinomatosis since previous study. Interval resolution of ascites.   Focal area of parenchymal consolidation in central right middle lobe, which measures 3 cm. Differential diagnosis includes infectious or inflammatory process, atelectasis, and neoplasm. Recommend short-term follow-up by chest CT in 2-3 months.     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   Surgeon: Donaciano Eva    Operative Findings:  : grossly normal uterus, ovaries normal, few scattered peritoneal nodules (41m) on serosa of uterus and tubes. The omentum (gastrocolic) was tethered to the mesentery with tumor (thin rind). Complete (optimal) resection of tumor with no gross residual disease.     02/17/2019 Pathology Results   FINAL MICROSCOPIC DIAGNOSIS: A. UTERUS, CERVIX, BILATERAL FALLOPIAN TUBES AND OVARIES, HYSTERECTOMY WITH SALPINGOOOPHORECTOMY: - Uterus: Endometrium: Inactive endometrium. No hyperplasia or malignancy. Myometrium: Unremarkable. No malignancy. Serosa: Metastatic carcinoma. No malignancy. - Cervix: Benign squamous and endocervical mucosa. No dysplasia or malignancy. - Left ovary and fallopian tube: Metastatic carcinoma. - Right ovary: No malignancy identified. - Right fallopian tube: Luminal tumor, see comment. B. OMENTUM, RESECTION: - High grade serous carcinoma. - Deposits up to at least 3 cm. -  See oncology table. ONCOLOGY TABLE: OVARY or  FALLOPIAN TUBE or PRIMARY PERITONEUM: Procedure: Hysterectomy with bilateral salpingo-oophorectomy and omentectomy. Specimen Integrity: Intact Tumor Site: Peritoneum Ovarian Surface Involvement (required only if applicable): Left ovary Fallopian Tube Surface Involvement (required only if applicable): Left Fallopian tube Tumor Size: Largest deposit 3 cm Histologic Type: High-grade serous carcinoma Histologic Grade: High-grade Implants (required for advanced stage serous/seromucinous borderline tumors only): Uterine serosa, left fallopian tube and ovary, omentum Other Tissue/ Organ Involvement: As above Largest Extrapelvic Peritoneal Focus (required only if applicable): 3 cm Peritoneal/Ascitic Fluid: Pre neoadjuvant [id number] positive for carcinoma. Treatment Effect (required only for high-grade serous carcinomas): Probably partial treatment effect in omental tissue. Regional Lymph Nodes: No lymph nodes submitted or found Pathologic Stage Classification (pTNM, AJCC 8th Edition): ypT3c, ypNX Representative Tumor Block: B5 Comment(s): There is surface involvement of the left ovary and fallopian tube but no primary tumor. Within the right fallopian tube lumen there is detached fragments of tumor but again no precursor lesion is noted in the right fallopian tube. Thus, the tumor is presumed primary peritoneal and staged as such.   03/21/2019 Tumor Marker   Patient's tumor was tested for the following markers: CA-125 Results of the tumor marker test revealed 15.1    Genetic Testing   Negative genetic testing. No pathogenic variants identified on the [facility] CancerNext + RNAinsight panel. VUS in ATM called [id number] identified. The report date is 05/12/2019. TumorNext HRD was originally ordered but there was not enough sample to complete this testing.   The CancerNext+RNAinsight gene panel offered by [facility] includes sequencing and rearrangement analysis for the following 36 genes: APC*, ATM*,  AXIN2, BARD1, BMPR1A, BRCA1*, BRCA2*, BRIP1*, CDH1*, CDK4, CDKN2A, CHEK2*, DICER1, MLH1*, MSH2*, MSH3, MSH6*, MUTYH*, NBN, NF1*, NTHL1, PALB2*, PMS2*, PTEN*, RAD51C*, RAD51D*, RECQL, SMAD4, SMARCA4, STK11 and TP53* (sequencing and deletion/duplication); HOXB13, POLD1 and POLE (sequencing only); EPCAM and GREM1 (deletion/duplication only). DNA and RNA analyses performed for * genes.    05/16/2019 Tumor Marker   Patient's tumor was tested for the following markers: CA-125 Results of the tumor marker test revealed 10.7   06/16/2019 Imaging   1. No evidence of residual or recurrent metastatic disease in the chest, abdomen or pelvis. 2. Masslike focus of consolidation in the right middle lobe along the major and minor fissures with associated volume loss is stable since 01/31/2019. Indolent primary pulmonary neoplasm not excluded. PET-CT may be considered for further characterization. 3. Aortic Atherosclerosis (ICD10-I70.0).   06/16/2019 Tumor Marker   Patient's tumor was tested for the following markers: CA-125 Results of the tumor marker test revealed 9.1.   12/05/2019 Tumor Marker   Patient's tumor was tested for the following markers: CA-125. Results of the tumor marker test revealed 9.0.   02/29/2020 Tumor Marker   Patient's tumor was tested for the following markers: CA-`125 Results of the tumor marker test revealed 9.7.   05/28/2020 Tumor Marker   Patient's tumor was tested for the following markers: CA-125 Results of the tumor marker test revealed 9.5   08/16/2020 Tumor Marker   Patient's tumor was tested for the following markers: CA-125 Results of the tumor marker test revealed 10.9   11/29/2020 Tumor Marker   Patient's tumor was tested for the following markers: CA-125. Results of the tumor marker test revealed 10.2.   01/22/2021 Pathology Results   SURGICAL PATHOLOGY  CASE: [phone]  PATIENT: Sampson Goon  Surgical Pathology Report   Clinical History: brain metastasis (cm)     FINAL MICROSCOPIC DIAGNOSIS:  A. 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    03/20/2021 Imaging   1. Unchanged enlarged midline superior mediastinal and left retroperitoneal lymph nodes, which remain suspicious for metastatic disease. 2. Unchanged masslike consolidation and volume loss of the right middle lobe measuring 3.8 x 1.9 cm, likely chronic scarring. Continued attention on follow-up. 3. No new evidence of metastatic disease in the chest, abdomen, or pelvis. 4. Status post hysterectomy and omentectomy.   Aortic Atherosclerosis (ICD10-I70.0).     03/21/2021 - 05/07/2021 Chemotherapy   Patient is on Treatment Plan : OVARIAN RECURRENT 3RD LINE Carboplatin D1 / Gemcitabine D1,8 (4/800) q21d     05/17/2021 Imaging   1. No new suspicious mass or lymphadenopathy identified in the chest, abdomen or pelvis. 2. Interval decreased size of previous enlarged superior mediastinal and left retroperitoneal lymph nodes. 3. Other ancillary findings as described.   05/21/2021 - 08/13/2021 Chemotherapy   Patient is on Treatment Plan : OVARIAN Gemcitabine D1,8 q21d      08/16/2021 Imaging   MRI brain  1. Regression of disease. Resolved marginal enhancement at the right middle frontal gyrus resection cavity and regressed enhancement at the treated left parietal lesion.   2. No new metastatic disease or acute intracranial abnormality.   08/19/2021 Imaging   Slight decrease in size of small left retroperitoneal and superior mediastinal lymph nodes.   No new or progressive disease within the chest, abdomen, or pelvis.   Aortic Atherosclerosis (ICD10-I70.0).       Current Therapy: currently on treatment break  Interval history- [name] WIEDEL is a 71 y.o. with oncologic history as above presenting to [facility] today with chief complaint of fall x 4 days ago.  Patient  states she was cleaning her shower and while attempting to exit tripped over the shower curb and fell to the floor landing on her left side.  She denies hitting her head or loss of consciousness.  Patient thinks she might of hit her left ribs on the toilet bowl or the floor.  Her husband helped her up and she has been ambulatory since the fall. She is reporting pain in her left ribs and scapula. She has a bruise on her back. She describes the pain in her ribs as sharp and being worse with taking a deep breath. She reports she is confident she is taking a deep breath. She denies any exertional dyspnea. She describes the pain in her scapula as a mild soreness that is worse with movement.  She has taken Tylenol sporadically with mild symptom improvement.  She admits to have a headache although states it is not related to the fall. She has had headaches since her brain surgery and they have felt similar. She does not have a headache currently. She denies neck pain, visual changes, chest pain, numbness, tingling, weakness, dizziness. Patient since her brain surgery she has been experiencing intermittent balance issues. She denies any changes in her balance at this time. Patient is not anticoagulated.  Chart review shows patient's last chemo was 08/13/21 with Gemzar. Currently on chemo break with plan for labs next month and surveillance imaging q3 months.    ROS  All other systems are reviewed and are negative for acute change except as noted in the HPI.    Allergies  Allergen Reactions   Carboplatin Itching and Rash    Patient has a  hypersensitivity reaction to carboplatin.  See note from 04/30/2021 at 1439.  Reaction symptoms included itching of mouth and ears, burning in chest, hypotension, and rash to neck and arms.  Patient unable to complete infusion.   Fentanyl Palpitations    Palpitations , fever ,headahe, N/V  " loud pounding heart beat"    Vancomycin Rash   Lac Bovis Diarrhea and Nausea And  Vomiting   Brimonidine Tartrate     Other reaction(s): blurred vision   Brimonidine Tartrate     Other reaction(s): blurred vision   Ditropan [Oxybutynin]    Milk-Related Compounds Diarrhea and Nausea And Vomiting   Other Other (See Comments)   Oxybutynin Chloride Other (See Comments)    Ditropan  Pt doesn't remember    Thimerosal Other (See Comments)    Pt doesn't remember   Tramadol Other (See Comments)    Pt doesn't remember   Avelox [Moxifloxacin Hcl In Nacl] Rash   Doxycycline Rash   Erythromycin Rash   Metronidazole Rash   Minocycline Rash   Penicillins Rash    Did it involve swelling of the face/tongue/throat, SOB, or low BP? No Did it involve sudden or severe rash/hives, skin peeling, or any reaction on the inside of your mouth or nose? Yes, rash. Did you need to seek medical attention at a hospital or doctor's office? No When did it last happen?      Childhood allergy If all above answers are "NO", may proceed with cephalosporin use.    Quinolones Rash   Sulfonamide Derivatives Rash     Past Medical History:  Diagnosis Date   Allergic rhinitis    Cervical dysplasia 1980   Cervicalgia    Eczema    Rosacea,dermatitis-Dr Stinehelfer   Esophageal reflux    Family history of colon cancer    Family history of melanoma    Family history of ovarian cancer    Family history of pancreatic cancer    Family history of prostate cancer    Family history of uterine cancer    Fibromyalgia    GERD (gastroesophageal reflux disease)    Glaucoma, narrow-angle    Heart murmur    Hiatal hernia    Irritable bowel syndrome    LBBB (left bundle branch block)    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), unspecified site    DCIS   Mass of lung    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 Arlyce Dice   Mitral valve disorders(424.0)    No antibiotics required   Osteoporosis 12/2016   T score -2.3, 2014 T score -2.5 AP spine   PAC (premature atrial contraction) 2012   ,PVC's, and  nonsustained atril tachycardia w aberration by heart monitor    PVC (premature ventricular contraction)    Rosacea    Stricture and stenosis of esophagus      Past Surgical History:  Procedure Laterality Date   APPLICATION OF CRANIAL NAVIGATION N/A 01/22/2021   Procedure: APPLICATION OF CRANIAL NAVIGATION;  Surgeon: Karsten Ro, DO;  Location: [facility];  Service: Neurosurgery;  Laterality: N/A;   BREAST LUMPECTOMY  1998   right breast with radiation   CERVICAL DISCECTOMY     C3-7 with bone graft, in three different surgeries, have titanium plate and screws   CRANIOTOMY N/A 01/22/2021   Procedure: STEREOTACTIC RIGHT FRONTAL CRANIOTOMY FOR RESECTION OF TUMOR;  Surgeon: Karsten Ro, DO;  Location: [facility];  Service: Neurosurgery;  Laterality: N/A;   GYNECOLOGIC CRYOSURGERY  1980   IR IMAGING  GUIDED PORT INSERTION  12/24/2018   NECK SURGERY  1993-1997   bone graft and fusion   OMENTECTOMY  02/17/2019   REFRACTIVE SURGERY     narrow angle glucoma   RHINOPLASTY  1976   sterotactic large core needle biopsy right breast Right 10/15/1996   TUBAL LIGATION  1977    Social History   Socioeconomic History   Marital status: Married    Spouse name: Antony Haste   Number of children: 0   Years of education: Not on file   Highest education level: Not on file  Occupational History   Occupation: Teacher, early years/pre: [employer]  Tobacco Use   Smoking status: Never   Smokeless tobacco: Never  Vaping Use   Vaping Use: Never used  Substance and Sexual Activity   Alcohol use: Yes    Alcohol/week: 2.0 standard drinks of alcohol    Types: 2 Standard drinks or equivalent per week    Comment: Social    Drug use: No   Sexual activity: Not Currently    Birth control/protection: Post-menopausal    Comment: 1st intercourse 71 yo- More than 5 partners  Other Topics Concern   Not on file  Social History Narrative   Not on file   Social Determinants of Health   Financial Resource  Strain: Not on file  Food Insecurity: Not on file  Transportation Needs: Not on file  Physical Activity: Not on file  Stress: Not on file  Social Connections: Not on file  Intimate Partner Violence: Not on file    Family History  Problem Relation Age of Onset   Heart disease Mother    Depression Mother    Hypertension Mother    Heart disease Father    Hypertension Father    Aneurysm Father    Uterine cancer Sister 33   Hypertension Sister    Ovarian cancer Sister 65   Melanoma Sister 43   Depression Sister    Melanoma Sister 67   Heart disease Paternal Grandfather    Stroke Maternal Grandmother    Cancer Maternal Grandfather 63       Pancreatic   Stroke Paternal Grandmother    Cancer Paternal Uncle        multiple myeloma, prostate cancer and melanoma (separate primaries)   Colon cancer Neg Hx      Current Outpatient Medications:    Azelaic Acid 15 % cream, Apply 1 application topically daily., Disp: , Rfl:    cholecalciferol (VITAMIN D3) 25 MCG (1000 UNIT) tablet, Take 2,000 Units by mouth daily., Disp: , Rfl:    gabapentin (NEURONTIN) 300 MG capsule, Take 300-600 mg by mouth See admin instructions. Taking 600 mg in am ;300 mg at lunch; 300 mg at hs., Disp: , Rfl:    levETIRAcetam (KEPPRA) 500 MG tablet, TAKE 2 TABLETS(1000 MG) BY MOUTH TWICE DAILY, Disp: 120 tablet, Rfl: 3   lidocaine-prilocaine (EMLA) cream, Apply to affected area(s) as needed., Disp: 30 g, Rfl: 3   Magnesium 250 MG TABS, Take 250 mg by mouth daily as needed., Disp: , Rfl:    ondansetron (ZOFRAN) 8 MG tablet, Take 1 tablet (8 mg total) by mouth every 8 (eight) hours as needed., Disp: 30 tablet, Rfl: 1   pantoprazole (PROTONIX) 40 MG tablet, Take 1 tablet (40 mg total) by mouth daily. (Patient taking differently: Take 40 mg by mouth daily as needed.), Disp: 30 tablet, Rfl: 3   prochlorperazine (COMPAZINE) 10 MG tablet, Take 1 tablet (10  mg total) by mouth every 6 (six) hours as needed (Nausea or  vomiting)., Disp: 30 tablet, Rfl: 1   Sulfacetamide Sodium, Acne, 10 % LOTN, Apply 1 application topically at bedtime., Disp: , Rfl:    VITAMIN E PO, Take 50 Int'l Units/day by mouth daily., Disp: , Rfl:   PHYSICAL EXAM: ECOG FS:1 - Symptomatic but completely ambulatory    Vitals:   09/12/21 1111  BP: (!) 142/85  Pulse: 96  Resp: 17  Temp: 98.1 F (36.7 C)  TempSrc: Oral  SpO2: 99%  Weight: 100 lb 6.4 oz (45.5 kg)  Height: _0  (1.651 m)   Physical Exam Vitals and nursing note reviewed.  Constitutional:      Appearance: She is well-developed. She is not ill-appearing or toxic-appearing.  HENT:     Head: Normocephalic and atraumatic. No raccoon eyes or Battle's sign.     Jaw: There is normal jaw occlusion.     Comments: No tenderness to palpation of skull. No deformities or crepitus noted. No open wounds, abrasions or lacerations.      Right Ear: Hearing and external ear normal. No hemotympanum.     Left Ear: Hearing and external ear normal. No hemotympanum.     Nose: Nose normal.     Mouth/Throat:     Mouth: Mucous membranes are moist.     Pharynx: Oropharynx is clear.  Eyes:     General: No scleral icterus.       Right eye: No discharge.        Left eye: No discharge.     Conjunctiva/sclera: Conjunctivae normal.  Neck:     Vascular: No JVD.     Comments: Full ROM intact without spinous process TTP. No bony stepoffs or deformities, no paraspinous muscle TTP or muscle spasms. No bruising, erythema, or swelling.   Cardiovascular:     Rate and Rhythm: Normal rate and regular rhythm.     Pulses: Normal pulses.          Radial pulses are 2+ on the right side and 2+ on the left side.     Heart sounds: Normal heart sounds.  Pulmonary:     Effort: Pulmonary effort is normal. No respiratory distress.     Breath sounds: Normal breath sounds. No stridor. No wheezing, rhonchi or rales.     Comments: Oxygen saturation is 99% on room air.  Patient has normal work of  breathing. Chest:     Chest wall: No tenderness.     Comments: Port  in left upper chest without signs of trauma or infection.  No tenderness to sternum. No anterior chest wall tenderness.  No deformity or crepitus noted.  No evidence of flail chest.   Abdominal:     General: There is no distension.  Musculoskeletal:        General: Normal range of motion.     Cervical back: Normal range of motion.       Back:     Comments: Full range of motion in all extremities No deformities. Palpated patient from head to toe with only bony tenderness over left posterior ribs and scapula.  Skin:    General: Skin is warm and dry.  Neurological:     Mental Status: She is oriented to person, place, and time.     GCS: GCS eye subscore is 4. GCS verbal subscore is 5. GCS motor subscore is 6.     Comments: Speech is clear and goal oriented, follows commands CN III-XII  intact, no facial droop Normal strength in upper and lower extremities bilaterally including dorsiflexion and plantar flexion, strong and equal grip strength Sensation normal to light and sharp touch Moves extremities without ataxia, coordination intact Normal finger to nose and rapid alternating movements Normal gait and balance  Psychiatric:        Behavior: Behavior normal.        LABORATORY DATA: I have reviewed the data as listed    Latest Ref Rng & Units 08/20/2021    7:48 AM 08/13/2021    7:33 AM 07/30/2021    9:10 AM  CBC  WBC 4.0 - 10.5 K/uL 3.0  5.7  2.8   Hemoglobin 12.0 - 15.0 g/dL 10.3  11.0  10.1   Hematocrit 36.0 - 46.0 % 30.0  32.7  29.6   Platelets 150 - 400 K/uL 206  225  353         Latest Ref Rng & Units 08/20/2021    7:48 AM 08/13/2021    7:33 AM 07/30/2021    9:10 AM  CMP  Glucose 70 - 99 mg/dL 100  94  90   BUN 8 - 23 mg/dL _0 Creatinine 0.44 - 1.00 mg/dL 0.51  0.55  0.51   Sodium 135 - 145 mmol/L 137  139  137   Potassium 3.5 - 5.1 mmol/L 3.9  3.6  3.7   Chloride 98 - 111 mmol/L 100  103   100   CO2 22 - 32 mmol/L _1 Calcium 8.9 - 10.3 mg/dL 9.7  9.5  9.2   Total Protein 6.5 - 8.1 g/dL 7.6  7.1  7.1   Total Bilirubin 0.3 - 1.2 mg/dL 0.3  0.4  0.3   Alkaline Phos 38 - 126 U/L 72  63  71   AST 15 - 41 U/L _2 ALT 0 - 44 U/L _3 RADIOGRAPHIC STUDIES: I have personally reviewed the radiological images as listed and agreed with the findings in the report. No images are attached to the encounter. DG Scapula Left  Result Date: 09/12/2021 CLINICAL DATA:  Pain post fall EXAM: LEFT SCAPULA - 2+ VIEWS COMPARISON:  CT 08/16/2021 FINDINGS: Left fifth, sixth, and seventh rib fractures at the posterolateral aspect, minimally displaced. Scapula intact. Shoulder unremarkable. Cervical fixation hardware partially visualized. IMPRESSION: 1. Negative scapula. 2. Left fifth, sixth, and seventh rib fractures. Electronically Signed   By: Lucrezia Europe M.D.   On: 09/12/2021 13:02   DG Ribs Unilateral W/Chest Left  Result Date: 09/12/2021 CLINICAL DATA:  fall, pain EXAM: LEFT RIBS AND CHEST - 3+ VIEW COMPARISON:  06/10/2016 FINDINGS: Minimally displaced fractures of the posterolateral aspect left fifth, sixth, and seventh ribs, new since previous. No pneumothorax or effusion. Heart size normal. Right IJ port catheter to the distal SVC. Chronic right middle lobe atelectasis/scarring. IMPRESSION: Left fifth, sixth, and seventh rib fractures, without effusion or pneumothorax. Electronically Signed   By: Lucrezia Europe M.D.   On: 09/12/2021 13:00   CT CHEST ABDOMEN PELVIS W CONTRAST  Result Date: 08/17/2021 CLINICAL DATA:  Follow-up ovarian carcinoma. Assess treatment response. * Tracking Code: BO * EXAM: CT CHEST, ABDOMEN, AND PELVIS WITH CONTRAST TECHNIQUE: Multidetector CT imaging of the chest, abdomen and pelvis was performed following the standard protocol during bolus administration of intravenous contrast. RADIATION DOSE REDUCTION: This exam was performed  according to the  departmental dose-optimization program which includes automated exposure control, adjustment of the mA and/or kV according to patient size and/or use of iterative reconstruction technique. CONTRAST:  160m OMNIPAQUE IOHEXOL 300 MG/ML  SOLN COMPARISON:  05/17/2021 FINDINGS: CT CHEST FINDINGS Cardiovascular: No acute findings. Mediastinum/Lymph Nodes: No masses or pathologically enlarged lymph nodes identified. 5 mm prevascular superior mediastinal lymph node image 15/2 shows slight decrease in size from 7 mm previously. Lungs/Pleura: No suspicious pulmonary nodules or masses identified. Chronic right middle lobe atelectasis remains stable. No evidence of central endobronchial obstruction. No evidence of infiltrate or pleural effusion. Musculoskeletal:  No suspicious bone lesions identified. CT ABDOMEN AND PELVIS FINDINGS Hepatobiliary: No masses identified. Gallbladder is unremarkable. No evidence of biliary ductal dilatation. Pancreas:  No mass or inflammatory changes. Spleen:  Within normal limits in size and appearance. Adrenals/Urinary tract:  No masses or hydronephrosis. Stomach/Bowel: No evidence of obstruction, inflammatory process, or abnormal fluid collections. Normal appendix visualized. Vascular/Lymphatic: Left paraaortic retroperitoneal lymph node measures 1.4 x 0.9 cm on image 64/2, compared to 1.5 x 1.0 cm previously. No other pathologically enlarged lymph nodes identified. No acute vascular findings. Aortic atherosclerotic calcification incidentally noted. Reproductive: Prior hysterectomy noted. Adnexal regions are unremarkable in appearance. Other:  No evidence peritoneal thickening, nodularity, or ascites. Musculoskeletal:  No suspicious bone lesions identified. IMPRESSION: Slight decrease in size of small left retroperitoneal and superior mediastinal lymph nodes. No new or progressive disease within the chest, abdomen, or pelvis. Aortic Atherosclerosis (ICD10-I70.0). Electronically Signed   By: JMarlaine HindM.D.   On: 08/17/2021 12:22   MR BRAIN W WO CONTRAST  Result Date: 08/16/2021 CLINICAL DATA:  71year old female with remote history of breast cancer. 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 Postoperative SRS, and also SRS to a left parietal metastasis. Persistent resection cavity enhancement in March. Restaging. EXAM: MRI HEAD WITHOUT AND WITH CONTRAST TECHNIQUE: Multiplanar, multiecho pulse sequences of the brain and surrounding structures were obtained without and with intravenous contrast. CONTRAST:  827mMULTIHANCE GADOBENATE DIMEGLUMINE 529 MG/ML IV SOLN COMPARISON:  05/16/2021 and earlier. FINDINGS: Brain: Right side craniotomy changes with right middle frontal gyrus resection cavity stable and size (series 10, image 23). Resolved marginal enhancement since March, which was nodular and along the superior margin. Unchanged hemosiderin and mild intrinsic T1 hyperintensity within the cavity. Mild surrounding T2 and FLAIR hyperintensity is stable, with no significant mass effect. Stable to regressed faint linear enhancement at the treated left parietal lesion sites series 11, image 117. No additional abnormal enhancement or new metastatic lesion identified. Stable to decreased smooth dural thickening underlying the craniotomy. No superimposed restricted diffusion to suggest acute infarction. No midline shift, mass effect, evidence of mass lesion, ventriculomegaly, or acute intracranial hemorrhage. Cervicomedullary junction and pituitary are within normal limits. Scattered additional bilateral nonspecific cerebral white matter T2 and FLAIR hyperintensity is stable. No new signal abnormality. Vascular: Major intracranial vascular flow voids are stable. The major dural venous sinuses are enhancing and appear to be patent. Skull and upper cervical spine: Prior cervical ACDF. Negative visible cervical spinal cord. Normal background bone  marrow signal. Stable right craniotomy changes. Sinuses/Orbits: Stable and negative. Other: Mastoids remain clear. Visible internal auditory structures appear normal. IMPRESSION: 1. Regression of disease. Resolved marginal enhancement at the right middle frontal gyrus resection cavity and regressed enhancement at the treated left parietal lesion. 2. No new metastatic disease or acute intracranial abnormality. Electronically Signed   By:  Genevie Ann M.D.   On: 08/16/2021 08:48     ASSESSMENT & PLAN: Patient is a 71 y.o. female  with oncologic history of peritoneal carcinoma followed by Dr. Alvy Bimler.  I have viewed most recent oncology note and lab work.   #)Fall -patient with left-sided rib pain after a fall.  She is nontoxic-appearing.  She is hemodynamically stable. No hypoxia.  She has clear lung sounds in all fields and normal work of breathing. No flail chest and no pain over her sternum. No difficulty ambulating. She has a normal neuro exam and denies hitting her head.  Engaged in shared decision-making with the patient and she does not wish to have head imaging at this time given she protected her head during the fall and did not hit it.  With no traumatic findings on exam I am comfortable with this plan. She has minimal bruising over left ribs and minimal swelling over left scapula are the only abnormal exam findings. She is agreeable to rib imaging and x-ray shows minimally displaced fractures of rib 5, 6, 7.  There is no effusion or pneumothorax seen. Scapula intact without injury. I viewed images and agree with radiologist impressions.  Discussed x-ray results with patient. Offered patient pain medication however she would like to try to continue Tylenol and hold off on narcotics at this time. Encouraged OTC lidocaine patches or voltaren gel to help with pain.  Discussed splinting to help with pain while coughing or sneezing.  At this time patient does not clinically have pneumonia.  Will give incentive  spirometer and encourage deep breathing.  Discussed signs and symptoms that would warrant emergency medical evaluation.   Oncologist agreeable with plan of care.  Visit Diagnosis: 1. Fall, initial encounter   2. Closed fracture of multiple ribs of left side, initial encounter      Orders Placed This Encounter  Procedures   DG Ribs Unilateral W/Chest Left    Standing Status:   Future    Number of Occurrences:   1    Standing Expiration Date:   09/13/2022    Order Specific Question:   Reason for Exam (SYMPTOM  OR DIAGNOSIS REQUIRED)    Answer:   fall, pain    Order Specific Question:   Preferred imaging location?    Answer:   [facility]   DG Scapula Left    Standing Status:   Future    Number of Occurrences:   1    Standing Expiration Date:   09/13/2022    Order Specific Question:   Reason for Exam (SYMPTOM  OR DIAGNOSIS REQUIRED)    Answer:   fall, pain    Order Specific Question:   Preferred imaging location?    Answer:   [facility]    All questions were answered. The patient knows to call the clinic with any problems, questions or concerns. No barriers to learning was detected.  I have spent a total of 20 minutes minutes of face-to-face and non-face-to-face time, preparing to see the patient, obtaining and/or reviewing separately obtained history, performing a medically appropriate examination, counseling and educating the patient, ordering tests,  documenting clinical information in the electronic health record, and care coordination.     Thank you for allowing me to participate in the care of this patient.    Barrie Folk, PA-C Department of Hematology/Oncology [facility] at [facility] Phone: [phone]  [phone]    09/12/2021 2:15 PM

## 2021-10-01 ENCOUNTER — Inpatient Hospital Stay (HOSPITAL_BASED_OUTPATIENT_CLINIC_OR_DEPARTMENT_OTHER): Payer: Medicare Other | Admitting: Hematology and Oncology

## 2021-10-01 ENCOUNTER — Telehealth: Payer: Self-pay | Admitting: Cardiology

## 2021-10-01 ENCOUNTER — Other Ambulatory Visit: Payer: Self-pay

## 2021-10-01 ENCOUNTER — Encounter: Payer: Self-pay | Admitting: Hematology and Oncology

## 2021-10-01 ENCOUNTER — Inpatient Hospital Stay: Payer: Medicare Other | Attending: Gynecologic Oncology

## 2021-10-01 VITALS — BP 106/43 | HR 80 | Temp 97.4°F | Resp 18 | Ht 65.0 in | Wt 100.0 lb

## 2021-10-01 DIAGNOSIS — D61818 Other pancytopenia: Secondary | ICD-10-CM | POA: Insufficient documentation

## 2021-10-01 DIAGNOSIS — D6481 Anemia due to antineoplastic chemotherapy: Secondary | ICD-10-CM | POA: Insufficient documentation

## 2021-10-01 DIAGNOSIS — C482 Malignant neoplasm of peritoneum, unspecified: Secondary | ICD-10-CM | POA: Insufficient documentation

## 2021-10-01 DIAGNOSIS — Z452 Encounter for adjustment and management of vascular access device: Secondary | ICD-10-CM | POA: Diagnosis not present

## 2021-10-01 DIAGNOSIS — T451X5A Adverse effect of antineoplastic and immunosuppressive drugs, initial encounter: Secondary | ICD-10-CM

## 2021-10-01 DIAGNOSIS — R5383 Other fatigue: Secondary | ICD-10-CM | POA: Diagnosis not present

## 2021-10-01 LAB — CMP (CANCER CENTER ONLY)
ALT: 15 U/L (ref 0–44)
AST: 24 U/L (ref 15–41)
Albumin: 4.3 g/dL (ref 3.5–5.0)
Alkaline Phosphatase: 85 U/L (ref 38–126)
Anion gap: 5 (ref 5–15)
BUN: 13 mg/dL (ref 8–23)
CO2: 30 mmol/L (ref 22–32)
Calcium: 9.5 mg/dL (ref 8.9–10.3)
Chloride: 102 mmol/L (ref 98–111)
Creatinine: 0.53 mg/dL (ref 0.44–1.00)
GFR, Estimated: >60 mL/min (ref >60)
Glucose, Bld: 86 mg/dL (ref 70–99)
Potassium: 3.7 mmol/L (ref 3.5–5.1)
Sodium: 137 mmol/L (ref 135–145)
Total Bilirubin: 0.5 mg/dL (ref 0.3–1.2)
Total Protein: 7.1 g/dL (ref 6.5–8.1)

## 2021-10-01 LAB — CBC WITH DIFFERENTIAL (CANCER CENTER ONLY)
Abs Immature Granulocytes: 0.01 10*3/uL (ref 0.00–0.07)
Basophils Absolute: 0 10*3/uL (ref 0.0–0.1)
Basophils Relative: 0 %
Eosinophils Absolute: 0.1 10*3/uL (ref 0.0–0.5)
Eosinophils Relative: 1 %
HCT: 34 % — ABNORMAL LOW (ref 36.0–46.0)
Hemoglobin: 11.5 g/dL — ABNORMAL LOW (ref 12.0–15.0)
Immature Granulocytes: 0 %
Lymphocytes Relative: 30 %
Lymphs Abs: 1.6 10*3/uL (ref 0.7–4.0)
MCH: 30.8 pg (ref 26.0–34.0)
MCHC: 33.8 g/dL (ref 30.0–36.0)
MCV: 91.2 fL (ref 80.0–100.0)
Monocytes Absolute: 0.4 10*3/uL (ref 0.1–1.0)
Monocytes Relative: 8 %
Neutro Abs: 3.1 10*3/uL (ref 1.7–7.7)
Neutrophils Relative %: 61 %
Platelet Count: 225 10*3/uL (ref 150–400)
RBC: 3.73 MIL/uL — ABNORMAL LOW (ref 3.87–5.11)
RDW: 12.9 % (ref 11.5–15.5)
WBC Count: 5.2 10*3/uL (ref 4.0–10.5)
nRBC: 0 % (ref 0.0–0.2)

## 2021-10-01 MED ORDER — SODIUM CHLORIDE 0.9% FLUSH
10.0000 mL | Freq: Once | INTRAVENOUS | Status: AC
  Administered 2021-10-01: 10 mL

## 2021-10-01 MED ORDER — HEPARIN SOD (PORK) LOCK FLUSH 100 UNIT/ML IV SOLN
500.0000 [IU] | Freq: Once | INTRAVENOUS | Status: AC
  Administered 2021-10-01: 500 [IU]

## 2021-10-01 NOTE — Progress Notes (Signed)
[place] OFFICE PROGRESS NOTE  Patient Care Team: Orpah Melter, MD as PCP - General (Family Medicine) Sueanne Margarita, MD as PCP - Cardiology (Cardiology)  ASSESSMENT & PLAN:  Malignant tumor of peritoneum [facility]) Her last CT imaging showed good response to treatment Residual active disease cannot be excluded but overall it is showing favorable response She is improving on treatment break Her bone pain and reduced appetite are resolving She continues to have persistent fatigue Her pancytopenia is resolving I plan to repeat imaging study again in September for further follow-up  Anemia due to antineoplastic chemotherapy Overall, her pancytopenia is improving She is reassured  Orders Placed This Encounter  Procedures   CT CHEST ABDOMEN PELVIS W CONTRAST    Standing Status:   Future    Standing Expiration Date:   10/02/2022    Order Specific Question:   Preferred imaging location?    Answer:   [facility]    Order Specific Question:   Radiology Contrast Protocol - do NOT remove file path    Answer:   [email or web address]    All questions were answered. The patient knows to call the clinic with any problems, questions or concerns. The total time spent in the appointment was 20 minutes encounter with patients including review of chart and various tests results, discussions about plan of care and coordination of care plan   [name], MD 10/01/2021 9:46 AM  INTERVAL HISTORY: Please see below for problem oriented charting. she returns for surveillance follow-up Overall, her energy level is fair She has persistent fatigue Bone pain is resolving and her appetite is getting better She continues to have occasional in the year discomfort intermittently Denies recent headaches, falls or weakness  REVIEW OF SYSTEMS:    All other systems were reviewed with the patient and are negative.  I have reviewed the past medical  history, past surgical history, social history and family history with the patient and they are unchanged from previous note.  ALLERGIES:  is allergic to carboplatin, fentanyl, vancomycin, milk (cow), brimonidine tartrate, brimonidine tartrate, ditropan [oxybutynin], milk-related compounds, other, oxybutynin chloride, thimerosal, tramadol, avelox [moxifloxacin hcl in nacl], doxycycline, erythromycin, metronidazole, minocycline, penicillins, quinolones, and sulfonamide derivatives.  MEDICATIONS:  Current Outpatient Medications  Medication Sig Dispense Refill   Azelaic Acid 15 % cream Apply 1 application topically daily.     cholecalciferol (VITAMIN D3) 25 MCG (1000 UNIT) tablet Take 2,000 Units by mouth daily.     gabapentin (NEURONTIN) 300 MG capsule Take 300-600 mg by mouth See admin instructions. Taking 600 mg in am ;300 mg at lunch; 300 mg at hs.     levETIRAcetam (KEPPRA) 500 MG tablet TAKE 2 TABLETS(1000 MG) BY MOUTH TWICE DAILY 120 tablet 3   lidocaine-prilocaine (EMLA) cream Apply to affected area(s) as needed. 30 g 3   Magnesium 250 MG TABS Take 250 mg by mouth daily as needed.     ondansetron (ZOFRAN) 8 MG tablet Take 1 tablet (8 mg total) by mouth every 8 (eight) hours as needed. 30 tablet 1   prochlorperazine (COMPAZINE) 10 MG tablet Take 1 tablet (10 mg total) by mouth every 6 (six) hours as needed (Nausea or vomiting). 30 tablet 1   Sulfacetamide Sodium, Acne, 10 % LOTN Apply 1 application topically at bedtime.     VITAMIN E PO Take 50 Int'l Units/day by mouth daily.     No current facility-administered medications for this visit.    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 Findings highly suspicious for peritoneal carcinomatosis. Recommend paracentesis for  therapeutic and diagnostic purposes. I do not see an obvious primary lesion but there is some irregular enhancing soft tissue in the right adnexal area. CA 125 level may be helpful. 2. Mild surface irregularity involving the liver but I do not see any obvious changes of cirrhosis. No hepatic lesions.   11/23/2018 Tumor Marker   Patient's tumor was tested for the following markers: CA-125 Results of the tumor marker test revealed 253.   11/25/2018 Initial Diagnosis   Peritoneal carcinoma ([place])   11/25/2018 Imaging   US paracentesis Successful ultrasound-guided paracentesis yielding 3.4 L of peritoneal fluid.   12/01/2018 Cancer Staging   Staging form: Ovary, Fallopian Tube, and Primary Peritoneal Carcinoma, AJCC 8th Edition - Clinical stage from 12/01/2018: Stage IVA (rcT3, cN0, pM1a) - Signed by [name], MD on 02/14/2021 Stage prefix: Recurrence   12/02/2018 Tumor Marker   Patient's tumor was tested for the following markers: CA-125 Results of the tumor marker test revealed 267   12/06/2018 - 05/16/2019 Chemotherapy   The patient had carboplatin and taxol for chemotherapy treatment.     12/08/2018 Procedure   Successful ultrasound-guided therapeutic paracentesis yielding 1.7 liters of peritoneal fluid.   12/24/2018 Procedure   Successful placement of a right IJ approach Power Port with ultrasound and fluoroscopic guidance. The catheter is ready for use   01/17/2019 Tumor Marker   Patient's tumor was tested for the following markers: CA-125 Results of the tumor marker test revealed 57.2   01/31/2019 Imaging   Significant decrease in peritoneal carcinomatosis since previous study. Interval resolution of ascites.   Focal area of parenchymal consolidation in central right middle lobe, which measures 3 cm. Differential diagnosis includes infectious or inflammatory process, atelectasis, and neoplasm. Recommend short-term follow-up by chest CT in 2-3 months.     02/17/2019 Surgery    Surgeon: Donaciano Eva     Pre-operative Diagnosis: primary peritoneal cancer stage IIIC    Operation: Robotic-assisted laparoscopic total hysterectomy with bilateral salpingoophorectomy, omentectomy, radical tumor debulking, minilaparotomy for omentectomy.   Surgeon: Donaciano Eva    Operative Findings:  : grossly normal uterus, ovaries normal, few scattered peritoneal nodules (19mm) on serosa of uterus and tubes. The omentum (gastrocolic) was tethered to the mesentery with tumor (thin rind). Complete (optimal) resection of tumor with no gross residual disease.     02/17/2019 Pathology Results   FINAL MICROSCOPIC DIAGNOSIS: A. UTERUS, CERVIX, BILATERAL FALLOPIAN TUBES AND OVARIES, HYSTERECTOMY WITH SALPINGOOOPHORECTOMY: - Uterus: Endometrium: Inactive endometrium. No hyperplasia or malignancy. Myometrium: Unremarkable. No malignancy. Serosa: Metastatic carcinoma. No malignancy. - Cervix: Benign squamous and endocervical mucosa. No dysplasia or malignancy. - Left ovary and fallopian tube: Metastatic carcinoma. - Right ovary: No malignancy identified. - Right fallopian tube: Luminal tumor, see comment. B. OMENTUM, RESECTION: - High grade serous carcinoma. - Deposits up to at least 3 cm. - See oncology table. ONCOLOGY TABLE: OVARY or FALLOPIAN TUBE or PRIMARY PERITONEUM: Procedure: Hysterectomy with bilateral salpingo-oophorectomy and omentectomy. Specimen Integrity: Intact Tumor Site: Peritoneum Ovarian Surface Involvement (required only if applicable): Left ovary Fallopian Tube Surface Involvement (required only if applicable): Left Fallopian tube Tumor Size: Largest deposit 3 cm Histologic Type: High-grade serous carcinoma Histologic Grade: High-grade Implants (required for advanced stage  serous/seromucinous borderline tumors only): Uterine serosa, left fallopian tube and ovary, omentum Other Tissue/ Organ Involvement: As above Largest Extrapelvic Peritoneal Focus  (required only if applicable): 3 cm Peritoneal/Ascitic Fluid: Pre neoadjuvant [id number] positive for carcinoma. Treatment Effect (required only for high-grade serous carcinomas): Probably partial treatment effect in omental tissue. Regional Lymph Nodes: No lymph nodes submitted or found Pathologic Stage Classification (pTNM, AJCC 8th Edition): ypT3c, ypNX Representative Tumor Block: B5 Comment(s): There is surface involvement of the left ovary and fallopian tube but no primary tumor. Within the right fallopian tube lumen there is detached fragments of tumor but again no precursor lesion is noted in the right fallopian tube. Thus, the tumor is presumed primary peritoneal and staged as such.   03/21/2019 Tumor Marker   Patient's tumor was tested for the following markers: CA-125 Results of the tumor marker test revealed 15.1    Genetic Testing   Negative genetic testing. No pathogenic variants identified on the [facility] CancerNext + RNAinsight panel. VUS in ATM called [id number] identified. The report date is 05/12/2019. TumorNext HRD was originally ordered but there was not enough sample to complete this testing.   The CancerNext+RNAinsight gene panel offered by [employer] includes sequencing and rearrangement analysis for the following 36 genes: APC*, ATM*, AXIN2, BARD1, BMPR1A, BRCA1*, BRCA2*, BRIP1*, CDH1*, CDK4, CDKN2A, CHEK2*, DICER1, MLH1*, MSH2*, MSH3, MSH6*, MUTYH*, NBN, NF1*, NTHL1, PALB2*, PMS2*, PTEN*, RAD51C*, RAD51D*, RECQL, SMAD4, SMARCA4, STK11 and TP53* (sequencing and deletion/duplication); HOXB13, POLD1 and POLE (sequencing only); EPCAM and GREM1 (deletion/duplication only). DNA and RNA analyses performed for * genes.    05/16/2019 Tumor Marker   Patient's tumor was tested for the following markers: CA-125 Results of the tumor marker test revealed 10.7   06/16/2019 Imaging   1. No evidence of residual or recurrent metastatic disease in the chest, abdomen or pelvis. 2. Masslike focus  of consolidation in the right middle lobe along the major and minor fissures with associated volume loss is stable since 01/31/2019. Indolent primary pulmonary neoplasm not excluded. PET-CT may be considered for further characterization. 3. Aortic Atherosclerosis (ICD10-I70.0).   06/16/2019 Tumor Marker   Patient's tumor was tested for the following markers: CA-125 Results of the tumor marker test revealed 9.1.   12/05/2019 Tumor Marker   Patient's tumor was tested for the following markers: CA-125. Results of the tumor marker test revealed 9.0.   02/29/2020 Tumor Marker   Patient's tumor was tested for the following markers: CA-`125 Results of the tumor marker test revealed 9.7.   05/28/2020 Tumor Marker   Patient's tumor was tested for the following markers: CA-125 Results of the tumor marker test revealed 9.5   08/16/2020 Tumor Marker   Patient's tumor was tested for the following markers: CA-125 Results of the tumor marker test revealed 10.9   11/29/2020 Tumor Marker   Patient's tumor was tested for the following markers: CA-125. Results of the tumor marker test revealed 10.2.   01/22/2021 Pathology Results   SURGICAL PATHOLOGY  CASE: [phone]  PATIENT: [name] [name]  Surgical Pathology Report   Clinical History: brain metastasis (cm)    FINAL MICROSCOPIC DIAGNOSIS:   A. 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    03/20/2021 Imaging   1. Unchanged enlarged midline superior mediastinal and left retroperitoneal lymph nodes, which remain suspicious for metastatic disease. 2. Unchanged masslike consolidation and volume loss of the  right middle lobe measuring 3.8 x 1.9 cm, likely chronic scarring. Continued attention on follow-up. 3. No new evidence of metastatic disease in the chest, abdomen, or pelvis. 4. Status post hysterectomy and  omentectomy.   Aortic Atherosclerosis (ICD10-I70.0).     03/21/2021 - 05/07/2021 Chemotherapy   Patient is on Treatment Plan : OVARIAN RECURRENT 3RD LINE Carboplatin D1 / Gemcitabine D1,8 (4/800) q21d     05/17/2021 Imaging   1. No new suspicious mass or lymphadenopathy identified in the chest, abdomen or pelvis. 2. Interval decreased size of previous enlarged superior mediastinal and left retroperitoneal lymph nodes. 3. Other ancillary findings as described.   05/21/2021 - 08/13/2021 Chemotherapy   Patient is on Treatment Plan : OVARIAN Gemcitabine D1,8 q21d      08/16/2021 Imaging   MRI brain  1. Regression of disease. Resolved marginal enhancement at the right middle frontal gyrus resection cavity and regressed enhancement at the treated left parietal lesion.   2. No new metastatic disease or acute intracranial abnormality.   08/19/2021 Imaging   Slight decrease in size of small left retroperitoneal and superior mediastinal lymph nodes.   No new or progressive disease within the chest, abdomen, or pelvis.   Aortic Atherosclerosis (ICD10-I70.0).       PHYSICAL EXAMINATION: ECOG PERFORMANCE STATUS: 1 - Symptomatic but completely ambulatory  Vitals:   10/01/21 0911  BP: (!) 106/43  Pulse: 80  Resp: 18  Temp: (!) 97.4 F (36.3 C)  SpO2: 100%   Filed Weights   10/01/21 0911  Weight: 100 lb (45.4 kg)    GENERAL:alert, no distress and comfortable NEURO: alert & oriented x 3 with fluent speech  LABORATORY DATA:  I have reviewed the data as listed    Component Value Date/Time   NA 137 10/01/2021 0839   K 3.7 10/01/2021 0839   CL 102 10/01/2021 0839   CO2 30 10/01/2021 0839   GLUCOSE 86 10/01/2021 0839   BUN 13 10/01/2021 0839   CREATININE 0.53 10/01/2021 0839   CALCIUM 9.5 10/01/2021 0839   PROT 7.1 10/01/2021 0839   ALBUMIN 4.3 10/01/2021 0839   AST 24 10/01/2021 0839   ALT 15 10/01/2021 0839   ALKPHOS 85 10/01/2021 0839   BILITOT 0.5 10/01/2021 0839   GFRNONAA  >60 10/01/2021 0839   GFRAA >60 12/05/2019 1145    No results found for: "SPEP", "UPEP"  Lab Results  Component Value Date   WBC 5.2 10/01/2021   NEUTROABS 3.1 10/01/2021   HGB 11.5 (L) 10/01/2021   HCT 34.0 (L) 10/01/2021   MCV 91.2 10/01/2021   PLT 225 10/01/2021      Chemistry      Component Value Date/Time   NA 137 10/01/2021 0839   K 3.7 10/01/2021 0839   CL 102 10/01/2021 0839   CO2 30 10/01/2021 0839   BUN 13 10/01/2021 0839   CREATININE 0.53 10/01/2021 0839      Component Value Date/Time   CALCIUM 9.5 10/01/2021 0839   ALKPHOS 85 10/01/2021 0839   AST 24 10/01/2021 0839   ALT 15 10/01/2021 0839   BILITOT 0.5 10/01/2021 3016

## 2021-10-01 NOTE — Telephone Encounter (Signed)
Pt states she was supposed to have an echo the end of last year, however she a lot going on with her cancer and brain surgery. She states she is now ready to schedule that echo. Informed her that Dr. Radford Pax will have to put another order in.

## 2021-10-01 NOTE — Assessment & Plan Note (Signed)
Her last CT imaging showed good response to treatment Residual active disease cannot be excluded but overall it is showing favorable response She is improving on treatment break Her bone pain and reduced appetite are resolving She continues to have persistent fatigue Her pancytopenia is resolving I plan to repeat imaging study again in September for further follow-up

## 2021-10-01 NOTE — Assessment & Plan Note (Signed)
Overall, her pancytopenia is improving She is reassured

## 2021-10-07 ENCOUNTER — Other Ambulatory Visit: Payer: Self-pay | Admitting: Internal Medicine

## 2021-10-07 ENCOUNTER — Telehealth: Payer: Self-pay | Admitting: *Deleted

## 2021-10-07 ENCOUNTER — Other Ambulatory Visit: Payer: Self-pay | Admitting: *Deleted

## 2021-10-07 MED ORDER — LEVETIRACETAM 750 MG PO TABS: 1500.0000 mg | ORAL_TABLET | Freq: Two times a day (BID) | ORAL | 3 refills | Status: DC

## 2021-10-07 MED ORDER — LEVETIRACETAM 500 MG PO TABS
1500.0000 mg | ORAL_TABLET | Freq: Two times a day (BID) | ORAL | 0 refills | Status: DC
Start: 2021-10-07 — End: 2021-12-02

## 2021-10-07 NOTE — Progress Notes (Signed)
Patient asked for smaller Keppra tablets so new order of Keppra 500 mg (3 tablets) X twice daily.  Reordered

## 2021-10-07 NOTE — Telephone Encounter (Signed)
Patient called to report possible new seizure activity.  Reports prior to this her last seizure was back in October 2022.    She reports this past Thursday she had an episode that lasted 5-8 minutes of a traveling numbness that started in her Left Thumb and moved over 3 fingers.  She also reports feeling some non visible facial twitching.    Then again on this past Sunday the episode reoccurred, same duration; left thumb and 1 addition finger and then her left upper and left lower lip became numb.  She reports still feeling the non visible facial twitching.  States she consistently takes her Keppra 1000 mg BID.  Denies any new stressors.  Routing to provider to advise how to proceed.  Visit? Dose Change?    Last MRI Brain on 08/15/2021 and next MRI is 11/27/2021.

## 2021-10-15 ENCOUNTER — Telehealth: Payer: Self-pay | Admitting: Cardiology

## 2021-10-15 NOTE — Telephone Encounter (Signed)
Pt asked to speak to Edmon Crape about her upcoming echo.

## 2021-10-22 DIAGNOSIS — D1801 Hemangioma of skin and subcutaneous tissue: Secondary | ICD-10-CM | POA: Diagnosis not present

## 2021-10-22 DIAGNOSIS — L718 Other rosacea: Secondary | ICD-10-CM | POA: Diagnosis not present

## 2021-10-22 DIAGNOSIS — D225 Melanocytic nevi of trunk: Secondary | ICD-10-CM | POA: Diagnosis not present

## 2021-10-22 DIAGNOSIS — L814 Other melanin hyperpigmentation: Secondary | ICD-10-CM | POA: Diagnosis not present

## 2021-10-22 DIAGNOSIS — I788 Other diseases of capillaries: Secondary | ICD-10-CM | POA: Diagnosis not present

## 2021-10-22 DIAGNOSIS — L720 Epidermal cyst: Secondary | ICD-10-CM | POA: Diagnosis not present

## 2021-10-22 DIAGNOSIS — L821 Other seborrheic keratosis: Secondary | ICD-10-CM | POA: Diagnosis not present

## 2021-10-22 DIAGNOSIS — D2239 Melanocytic nevi of other parts of face: Secondary | ICD-10-CM | POA: Diagnosis not present

## 2021-11-06 ENCOUNTER — Encounter: Payer: Self-pay | Admitting: Cardiology

## 2021-11-06 ENCOUNTER — Ambulatory Visit (HOSPITAL_COMMUNITY): Payer: Medicare Other | Attending: Cardiovascular Disease

## 2021-11-06 DIAGNOSIS — I447 Left bundle-branch block, unspecified: Secondary | ICD-10-CM | POA: Insufficient documentation

## 2021-11-06 DIAGNOSIS — I493 Ventricular premature depolarization: Secondary | ICD-10-CM | POA: Diagnosis not present

## 2021-11-06 DIAGNOSIS — I059 Rheumatic mitral valve disease, unspecified: Secondary | ICD-10-CM | POA: Insufficient documentation

## 2021-11-06 DIAGNOSIS — I351 Nonrheumatic aortic (valve) insufficiency: Secondary | ICD-10-CM | POA: Insufficient documentation

## 2021-11-06 DIAGNOSIS — I471 Supraventricular tachycardia: Secondary | ICD-10-CM | POA: Diagnosis not present

## 2021-11-06 LAB — ECHOCARDIOGRAM COMPLETE
Area-P 1/2: 3.2 cm2
S' Lateral: 2.7 cm

## 2021-11-12 ENCOUNTER — Other Ambulatory Visit (HOSPITAL_COMMUNITY): Payer: Self-pay

## 2021-11-25 ENCOUNTER — Ambulatory Visit: Payer: Medicare Other | Admitting: Internal Medicine

## 2021-11-27 ENCOUNTER — Ambulatory Visit
Admission: RE | Admit: 2021-11-27 | Discharge: 2021-11-27 | Disposition: A | Discharge status: Home / Self Care | Payer: Medicare Other | Source: Ambulatory Visit | Attending: Internal Medicine | Admitting: Internal Medicine

## 2021-11-27 DIAGNOSIS — Z9889 Other specified postprocedural states: Secondary | ICD-10-CM | POA: Diagnosis not present

## 2021-11-27 DIAGNOSIS — C719 Malignant neoplasm of brain, unspecified: Secondary | ICD-10-CM | POA: Diagnosis not present

## 2021-11-27 DIAGNOSIS — C7931 Secondary malignant neoplasm of brain: Secondary | ICD-10-CM

## 2021-11-27 MED ORDER — GADOBENATE DIMEGLUMINE 529 MG/ML IV SOLN
9.0000 mL | Freq: Once | INTRAVENOUS | Status: AC | PRN
  Administered 2021-11-27: 9 mL via INTRAVENOUS

## 2021-11-28 ENCOUNTER — Inpatient Hospital Stay: Payer: Medicare Other | Attending: Gynecologic Oncology

## 2021-11-28 ENCOUNTER — Other Ambulatory Visit: Payer: Self-pay

## 2021-11-28 ENCOUNTER — Encounter (HOSPITAL_COMMUNITY): Payer: Self-pay

## 2021-11-28 ENCOUNTER — Ambulatory Visit (HOSPITAL_COMMUNITY)
Admission: RE | Admit: 2021-11-28 | Discharge: 2021-11-28 | Disposition: A | Discharge status: Home / Self Care | Payer: Medicare Other | Source: Ambulatory Visit | Attending: Hematology and Oncology | Admitting: Hematology and Oncology

## 2021-11-28 DIAGNOSIS — C482 Malignant neoplasm of peritoneum, unspecified: Secondary | ICD-10-CM | POA: Insufficient documentation

## 2021-11-28 DIAGNOSIS — G620 Drug-induced polyneuropathy: Secondary | ICD-10-CM | POA: Insufficient documentation

## 2021-11-28 DIAGNOSIS — Z8543 Personal history of malignant neoplasm of ovary: Secondary | ICD-10-CM | POA: Diagnosis not present

## 2021-11-28 DIAGNOSIS — R590 Localized enlarged lymph nodes: Secondary | ICD-10-CM | POA: Diagnosis not present

## 2021-11-28 DIAGNOSIS — C7931 Secondary malignant neoplasm of brain: Secondary | ICD-10-CM | POA: Insufficient documentation

## 2021-11-28 DIAGNOSIS — I700 Atherosclerosis of aorta: Secondary | ICD-10-CM | POA: Diagnosis not present

## 2021-11-28 DIAGNOSIS — H81399 Other peripheral vertigo, unspecified ear: Secondary | ICD-10-CM | POA: Insufficient documentation

## 2021-11-28 DIAGNOSIS — R5383 Other fatigue: Secondary | ICD-10-CM | POA: Insufficient documentation

## 2021-11-28 DIAGNOSIS — K4090 Unilateral inguinal hernia, without obstruction or gangrene, not specified as recurrent: Secondary | ICD-10-CM | POA: Diagnosis not present

## 2021-11-28 DIAGNOSIS — Z79899 Other long term (current) drug therapy: Secondary | ICD-10-CM | POA: Insufficient documentation

## 2021-11-28 DIAGNOSIS — J984 Other disorders of lung: Secondary | ICD-10-CM | POA: Diagnosis not present

## 2021-11-28 DIAGNOSIS — T451X5A Adverse effect of antineoplastic and immunosuppressive drugs, initial encounter: Secondary | ICD-10-CM | POA: Insufficient documentation

## 2021-11-28 DIAGNOSIS — J9811 Atelectasis: Secondary | ICD-10-CM | POA: Diagnosis not present

## 2021-11-28 LAB — CBC WITH DIFFERENTIAL (CANCER CENTER ONLY)
Abs Immature Granulocytes: 0.01 10*3/uL (ref 0.00–0.07)
Basophils Absolute: 0 10*3/uL (ref 0.0–0.1)
Basophils Relative: 1 %
Eosinophils Absolute: 0 10*3/uL (ref 0.0–0.5)
Eosinophils Relative: 1 %
HCT: 36.6 % (ref 36.0–46.0)
Hemoglobin: 12.5 g/dL (ref 12.0–15.0)
Immature Granulocytes: 0 %
Lymphocytes Relative: 34 %
Lymphs Abs: 1.9 10*3/uL (ref 0.7–4.0)
MCH: 30.8 pg (ref 26.0–34.0)
MCHC: 34.2 g/dL (ref 30.0–36.0)
MCV: 90.1 fL (ref 80.0–100.0)
Monocytes Absolute: 0.5 10*3/uL (ref 0.1–1.0)
Monocytes Relative: 8 %
Neutro Abs: 3.2 10*3/uL (ref 1.7–7.7)
Neutrophils Relative %: 56 %
Platelet Count: 231 10*3/uL (ref 150–400)
RBC: 4.06 MIL/uL (ref 3.87–5.11)
RDW: 12.9 % (ref 11.5–15.5)
WBC Count: 5.7 10*3/uL (ref 4.0–10.5)
nRBC: 0 % (ref 0.0–0.2)

## 2021-11-28 LAB — CMP (CANCER CENTER ONLY)
ALT: 24 U/L (ref 0–44)
AST: 35 U/L (ref 15–41)
Albumin: 4.6 g/dL (ref 3.5–5.0)
Alkaline Phosphatase: 65 U/L (ref 38–126)
Anion gap: 6 (ref 5–15)
BUN: 10 mg/dL (ref 8–23)
CO2: 30 mmol/L (ref 22–32)
Calcium: 9.9 mg/dL (ref 8.9–10.3)
Chloride: 102 mmol/L (ref 98–111)
Creatinine: 0.5 mg/dL (ref 0.44–1.00)
GFR, Estimated: >60 mL/min (ref >60)
Glucose, Bld: 97 mg/dL (ref 70–99)
Potassium: 4.1 mmol/L (ref 3.5–5.1)
Sodium: 138 mmol/L (ref 135–145)
Total Bilirubin: 0.4 mg/dL (ref 0.3–1.2)
Total Protein: 7.3 g/dL (ref 6.5–8.1)

## 2021-11-28 MED ORDER — HEPARIN SOD (PORK) LOCK FLUSH 100 UNIT/ML IV SOLN
500.0000 [IU] | Freq: Once | INTRAVENOUS | Status: AC
  Administered 2021-11-28: 500 [IU] via INTRAVENOUS

## 2021-11-28 MED ORDER — IOHEXOL 300 MG/ML  SOLN
100.0000 mL | Freq: Once | INTRAMUSCULAR | Status: AC | PRN
  Administered 2021-11-28: 100 mL via INTRAVENOUS

## 2021-11-28 MED ORDER — SODIUM CHLORIDE 0.9% FLUSH
10.0000 mL | Freq: Once | INTRAVENOUS | Status: AC
  Administered 2021-11-28: 10 mL

## 2021-11-28 MED ORDER — HEPARIN SOD (PORK) LOCK FLUSH 100 UNIT/ML IV SOLN: 500.0000 [IU] | Freq: Once | INTRAVENOUS | Status: DC

## 2021-11-28 MED ORDER — SODIUM CHLORIDE (PF) 0.9 % IJ SOLN
INTRAMUSCULAR | Status: AC
  Filled 2021-11-28: qty 50

## 2021-11-29 ENCOUNTER — Inpatient Hospital Stay (HOSPITAL_BASED_OUTPATIENT_CLINIC_OR_DEPARTMENT_OTHER): Payer: Medicare Other | Admitting: Hematology and Oncology

## 2021-11-29 ENCOUNTER — Encounter: Payer: Self-pay | Admitting: Hematology and Oncology

## 2021-11-29 VITALS — BP 107/49 | HR 87 | Resp 18 | Ht 65.0 in | Wt 100.0 lb

## 2021-11-29 DIAGNOSIS — C7931 Secondary malignant neoplasm of brain: Secondary | ICD-10-CM

## 2021-11-29 DIAGNOSIS — H81399 Other peripheral vertigo, unspecified ear: Secondary | ICD-10-CM | POA: Diagnosis not present

## 2021-11-29 DIAGNOSIS — T451X5A Adverse effect of antineoplastic and immunosuppressive drugs, initial encounter: Secondary | ICD-10-CM | POA: Diagnosis not present

## 2021-11-29 DIAGNOSIS — R5383 Other fatigue: Secondary | ICD-10-CM | POA: Diagnosis not present

## 2021-11-29 DIAGNOSIS — C482 Malignant neoplasm of peritoneum, unspecified: Secondary | ICD-10-CM | POA: Diagnosis not present

## 2021-11-29 DIAGNOSIS — Z79899 Other long term (current) drug therapy: Secondary | ICD-10-CM | POA: Diagnosis not present

## 2021-11-29 DIAGNOSIS — G620 Drug-induced polyneuropathy: Secondary | ICD-10-CM | POA: Diagnosis not present

## 2021-11-29 NOTE — Assessment & Plan Note (Signed)
She will continue follow-up with neuro oncologist in this regard

## 2021-11-29 NOTE — Assessment & Plan Note (Addendum)
I have reviewed multiple imaging studies with the patient and her husband She has no signs of active disease Previously measurable lymph nodes are stable/improved Her persistent fatigue is not related to her prior treatment She has appointment to follow-up with neuro oncologist for her other symptoms  such as dizziness and discomfort on the left side of her face We will continue active surveillance follow-up She will return in November for port flush and GYN follow-up I plan to repeat blood work and imaging study in January

## 2021-11-29 NOTE — Progress Notes (Signed)
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[facility]) I have reviewed multiple imaging studies with the patient and her husband She has no signs of active disease Previously measurable lymph nodes are stable/improved Her persistent fatigue is not related to her prior treatment She has appointment to follow-up with neuro oncologist for her other symptoms  such as dizziness and discomfort on the left side of her face We will continue active surveillance follow-up She will return in November for port flush and GYN follow-up I plan to repeat blood work and imaging study in January  Secondary malignant neoplasm of brain [facility]) She will continue follow-up with neuro oncologist in this regard  Orders Placed This Encounter  Procedures   CT CHEST ABDOMEN PELVIS W CONTRAST    Standing Status:   Future    Standing Expiration Date:   11/30/2022    Order Specific Question:   Preferred imaging location?    Answer:   [facility]    Order Specific Question:   Radiology Contrast Protocol - do NOT remove file path    Answer:   [email or web address]    All questions were answered. The patient knows to call the clinic with any problems, questions or concerns. The total time spent in the appointment was 30 minutes encounter with patients including review of chart and various tests results, discussions about plan of care and coordination of care plan   [name], MD 11/29/2021 11:52 AM  INTERVAL HISTORY: Please see below for problem oriented charting. she returns for review of test results with her husband She complained of excessive fatigue, occasional dizziness and intermittent discomfort on the left side of her face Denies abdominal pain or changes in bowel habits  I have  reviewed the past medical history, past surgical history, social history and family history with the patient and they are unchanged from previous note.  ALLERGIES:  is allergic to carboplatin, fentanyl, vancomycin, milk (cow), brimonidine tartrate, brimonidine tartrate, ditropan [oxybutynin], milk-related compounds, other, oxybutynin chloride, thimerosal, tramadol, avelox [moxifloxacin hcl in nacl], doxycycline, erythromycin, metronidazole, minocycline, penicillins, quinolones, and sulfonamide derivatives.  MEDICATIONS:  Current Outpatient Medications  Medication Sig Dispense Refill   gabapentin (NEURONTIN) 300 MG capsule Take 300 mg by mouth 3 (three) times daily.     Azelaic Acid 15 % cream Apply 1 application topically daily.     cholecalciferol (VITAMIN D3) 25 MCG (1000 UNIT) tablet Take 2,000 Units by mouth daily.     levETIRAcetam (KEPPRA) 500 MG tablet Take 3 tablets (1,500 mg total) by mouth 2 (two) times daily. 180 tablet 0   lidocaine-prilocaine (EMLA) cream Apply to affected area(s) as needed. 30 g 3   Magnesium 250 MG TABS Take 250 mg by mouth daily as needed.     ondansetron (ZOFRAN) 8 MG tablet Take 1 tablet (8 mg total) by mouth every 8 (eight) hours as needed. 30 tablet 1   prochlorperazine (COMPAZINE) 10 MG tablet Take 1 tablet (10 mg total) by mouth every 6 (six) hours as needed (Nausea or vomiting). 30 tablet 1   Sulfacetamide Sodium, Acne, 10 % LOTN Apply 1 application topically at bedtime.     VITAMIN E PO Take 50 Int'l Units/day by mouth daily.     No current facility-administered medications for this visit.    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 Findings highly suspicious for peritoneal carcinomatosis. Recommend paracentesis for therapeutic and  diagnostic purposes. I do not see an obvious primary lesion but there is some irregular enhancing soft tissue in the right adnexal area. CA 125 level may be helpful. 2. Mild surface irregularity involving the liver but I do not see any obvious changes of cirrhosis. No hepatic lesions.   11/23/2018 Tumor Marker   Patient's tumor was tested for the following markers: CA-125 Results of the tumor marker test revealed 253.   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   12/01/2018 Cancer Staging   Staging form: Ovary, Fallopian Tube, and Primary Peritoneal Carcinoma, AJCC 8th Edition - Clinical stage from 12/01/2018: Stage IVA (rcT3, cN0, pM1a) - Signed by [name], MD on 02/14/2021 Stage prefix: Recurrence   12/02/2018 Tumor Marker   Patient's tumor was tested for the following markers: CA-125 Results of the tumor marker test revealed 267   12/06/2018 - 05/16/2019 Chemotherapy   The patient had carboplatin and taxol for chemotherapy treatment.     12/08/2018 Procedure   Successful ultrasound-guided therapeutic paracentesis yielding 1.7 liters of peritoneal fluid.   12/24/2018 Procedure   Successful placement of a right IJ approach Power Port with ultrasound and fluoroscopic guidance. The catheter is ready for use   01/17/2019 Tumor Marker   Patient's tumor was tested for the following markers: CA-125 Results of the tumor marker test revealed 57.2   01/31/2019 Imaging   Significant decrease in peritoneal carcinomatosis since previous study. Interval resolution of ascites.   Focal area of parenchymal consolidation in central right middle lobe, which measures 3 cm. Differential diagnosis includes infectious or inflammatory process, atelectasis, and neoplasm. Recommend short-term follow-up by chest CT in 2-3 months.     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   Surgeon: Donaciano Eva    Operative Findings:  : grossly normal uterus, ovaries normal, few scattered peritoneal nodules (62m) on serosa of uterus and tubes. The omentum (gastrocolic) was tethered to the mesentery with tumor (thin rind). Complete (optimal) resection of tumor with no gross residual disease.     02/17/2019 Pathology Results   FINAL MICROSCOPIC DIAGNOSIS: A. UTERUS, CERVIX, BILATERAL FALLOPIAN TUBES AND OVARIES, HYSTERECTOMY WITH SALPINGOOOPHORECTOMY: - Uterus: Endometrium: Inactive endometrium. No hyperplasia or malignancy. Myometrium: Unremarkable. No malignancy. Serosa: Metastatic carcinoma. No malignancy. - Cervix: Benign squamous and endocervical mucosa. No dysplasia or malignancy. - Left ovary and fallopian tube: Metastatic carcinoma. - Right ovary: No malignancy identified. - Right fallopian tube: Luminal tumor, see comment. B. OMENTUM, RESECTION: - High grade serous carcinoma. - Deposits up to at least 3 cm. - See oncology table. ONCOLOGY TABLE: OVARY or FALLOPIAN TUBE or PRIMARY PERITONEUM: Procedure: Hysterectomy with bilateral salpingo-oophorectomy and omentectomy. Specimen Integrity: Intact Tumor Site: Peritoneum Ovarian Surface Involvement (required only if applicable): Left ovary Fallopian Tube Surface Involvement (required only if applicable): Left Fallopian tube Tumor Size: Largest deposit 3 cm Histologic Type: High-grade serous carcinoma Histologic Grade: High-grade Implants (required for advanced  stage serous/seromucinous borderline tumors only): Uterine serosa, left fallopian tube and ovary, omentum Other Tissue/ Organ Involvement: As above Largest Extrapelvic Peritoneal Focus (required only if  applicable): 3 cm Peritoneal/Ascitic Fluid: Pre neoadjuvant [id number] positive for carcinoma. Treatment Effect (required only for high-grade serous carcinomas): Probably partial treatment effect in omental tissue. Regional Lymph Nodes: No lymph nodes submitted or found Pathologic Stage Classification (pTNM, AJCC 8th Edition): ypT3c, ypNX Representative Tumor Block: B5 Comment(s): There is surface involvement of the left ovary and fallopian tube but no primary tumor. Within the right fallopian tube lumen there is detached fragments of tumor but again no precursor lesion is noted in the right fallopian tube. Thus, the tumor is presumed primary peritoneal and staged as such.   03/21/2019 Tumor Marker   Patient's tumor was tested for the following markers: CA-125 Results of the tumor marker test revealed 15.1    Genetic Testing   Negative genetic testing. No pathogenic variants identified on the [facility] CancerNext + RNAinsight panel. VUS in ATM called [id number] identified. The report date is 05/12/2019. TumorNext HRD was originally ordered but there was not enough sample to complete this testing.   The CancerNext+RNAinsight gene panel offered by [employer] [facility] includes sequencing and rearrangement analysis for the following 36 genes: APC*, ATM*, AXIN2, BARD1, BMPR1A, BRCA1*, BRCA2*, BRIP1*, CDH1*, CDK4, CDKN2A, CHEK2*, DICER1, MLH1*, MSH2*, MSH3, MSH6*, MUTYH*, NBN, NF1*, NTHL1, PALB2*, PMS2*, PTEN*, RAD51C*, RAD51D*, RECQL, SMAD4, SMARCA4, STK11 and TP53* (sequencing and deletion/duplication); HOXB13, POLD1 and POLE (sequencing only); EPCAM and GREM1 (deletion/duplication only). DNA and RNA analyses performed for * genes.    05/16/2019 Tumor Marker   Patient's tumor was tested for the following markers: CA-125 Results of the tumor marker test revealed 10.7   06/16/2019 Imaging   1. No evidence of residual or recurrent metastatic disease in the chest, abdomen or pelvis. 2. Masslike focus of consolidation  in the right middle lobe along the major and minor fissures with associated volume loss is stable since 01/31/2019. Indolent primary pulmonary neoplasm not excluded. PET-CT may be considered for further characterization. 3. Aortic Atherosclerosis (ICD10-I70.0).   06/16/2019 Tumor Marker   Patient's tumor was tested for the following markers: CA-125 Results of the tumor marker test revealed 9.1.   12/05/2019 Tumor Marker   Patient's tumor was tested for the following markers: CA-125. Results of the tumor marker test revealed 9.0.   02/29/2020 Tumor Marker   Patient's tumor was tested for the following markers: CA-`125 Results of the tumor marker test revealed 9.7.   05/28/2020 Tumor Marker   Patient's tumor was tested for the following markers: CA-125 Results of the tumor marker test revealed 9.5   08/16/2020 Tumor Marker   Patient's tumor was tested for the following markers: CA-125 Results of the tumor marker test revealed 10.9   11/29/2020 Tumor Marker   Patient's tumor was tested for the following markers: CA-125. Results of the tumor marker test revealed 10.2.   01/22/2021 Pathology Results   SURGICAL PATHOLOGY  CASE: [id number]  PATIENT: Sampson Goon  Surgical Pathology Report   Clinical History: brain metastasis (cm)    FINAL MICROSCOPIC DIAGNOSIS:   A. 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    03/20/2021 Imaging   1. Unchanged enlarged midline superior mediastinal and left retroperitoneal lymph nodes, which remain suspicious for metastatic disease. 2. Unchanged masslike consolidation and volume loss of  the right middle lobe measuring 3.8 x 1.9 cm, likely chronic scarring. Continued attention on follow-up. 3. No new evidence of metastatic disease in the chest, abdomen, or pelvis. 4. Status post hysterectomy and omentectomy.    Aortic Atherosclerosis (ICD10-I70.0).     03/21/2021 - 05/07/2021 Chemotherapy   Patient is on Treatment Plan : OVARIAN RECURRENT 3RD LINE Carboplatin D1 / Gemcitabine D1,8 (4/800) q21d     05/17/2021 Imaging   1. No new suspicious mass or lymphadenopathy identified in the chest, abdomen or pelvis. 2. Interval decreased size of previous enlarged superior mediastinal and left retroperitoneal lymph nodes. 3. Other ancillary findings as described.   05/21/2021 - 08/13/2021 Chemotherapy   Patient is on Treatment Plan : OVARIAN Gemcitabine D1,8 q21d      08/16/2021 Imaging   MRI brain  1. Regression of disease. Resolved marginal enhancement at the right middle frontal gyrus resection cavity and regressed enhancement at the treated left parietal lesion.   2. No new metastatic disease or acute intracranial abnormality.   08/19/2021 Imaging   Slight decrease in size of small left retroperitoneal and superior mediastinal lymph nodes.   No new or progressive disease within the chest, abdomen, or pelvis.   Aortic Atherosclerosis (ICD10-I70.0).     11/29/2021 Imaging   1. Slightly decreased size of the small left retroperitoneal lymph node with stable superior mediastinal lymph node. 2. No new or progressive disease within the chest, abdomen or pelvis. 3.  Aortic Atherosclerosis (ICD10-I70.0).       PHYSICAL EXAMINATION: ECOG PERFORMANCE STATUS: 1 - Symptomatic but completely ambulatory  Vitals:   11/29/21 1030  BP: (!) 107/49  Pulse: 87  Resp: 18  SpO2: 100%   Filed Weights   11/29/21 1030  Weight: 100 lb (45.4 kg)    GENERAL:alert, no distress and comfortable NEURO: alert & oriented x 3 with fluent speech, no focal motor/sensory deficits  LABORATORY DATA:  I have reviewed the data as listed    Component Value Date/Time   NA 138 11/28/2021 0850   K 4.1 11/28/2021 0850   CL 102 11/28/2021 0850   CO2 30 11/28/2021 0850   GLUCOSE 97 11/28/2021 0850   BUN 10 11/28/2021 0850    CREATININE 0.50 11/28/2021 0850   CALCIUM 9.9 11/28/2021 0850   PROT 7.3 11/28/2021 0850   ALBUMIN 4.6 11/28/2021 0850   AST 35 11/28/2021 0850   ALT 24 11/28/2021 0850   ALKPHOS 65 11/28/2021 0850   BILITOT 0.4 11/28/2021 0850   GFRNONAA >60 11/28/2021 0850   GFRAA >60 12/05/2019 1145    No results found for: "SPEP", "UPEP"  Lab Results  Component Value Date   WBC 5.7 11/28/2021   NEUTROABS 3.2 11/28/2021   HGB 12.5 11/28/2021   HCT 36.6 11/28/2021   MCV 90.1 11/28/2021   PLT 231 11/28/2021      Chemistry      Component Value Date/Time   NA 138 11/28/2021 0850   K 4.1 11/28/2021 0850   CL 102 11/28/2021 0850   CO2 30 11/28/2021 0850   BUN 10 11/28/2021 0850   CREATININE 0.50 11/28/2021 0850      Component Value Date/Time   CALCIUM 9.9 11/28/2021 0850   ALKPHOS 65 11/28/2021 0850   AST 35 11/28/2021 0850   ALT 24 11/28/2021 0850   BILITOT 0.4 11/28/2021 0850       RADIOGRAPHIC STUDIES: I have reviewed multiple imaging studies with the patient and her husband I have personally reviewed the radiological images  as listed and agreed with the findings in the report. CT CHEST ABDOMEN PELVIS W CONTRAST  Result Date: 11/29/2021 CLINICAL DATA:  History of ovarian cancer, assess treatment response. * Tracking Code: BO * EXAM: CT CHEST, ABDOMEN, AND PELVIS WITH CONTRAST TECHNIQUE: Multidetector CT imaging of the chest, abdomen and pelvis was performed following the standard protocol during bolus administration of intravenous contrast. RADIATION DOSE REDUCTION: This exam was performed according to the departmental dose-optimization program which includes automated exposure control, adjustment of the mA and/or kV according to patient size and/or use of iterative reconstruction technique. CONTRAST:  155m OMNIPAQUE IOHEXOL 300 MG/ML  SOLN COMPARISON:  Multiple priors including most recent CT chest abdomen pelvis dated August 16, 2021 FINDINGS: CT CHEST FINDINGS Cardiovascular: Accessed  right chest Port-A-Cath with tip near the superior cavoatrial junction. Aortic atherosclerosis. Normal caliber thoracic aorta without evidence of dissection. No central pulmonary embolus on this nondedicated study. Normal size heart. No significant pericardial effusion/thickening. Mediastinum/Nodes: No supraclavicular adenopathy. No suspicious thyroid nodule. Previously indexed prevascular superior mediastinal lymph node measures 5 mm in short axis on image 18/2, unchanged. No pathologically enlarged mediastinal, hilar or axillary lymph nodes. The esophagus is grossly unremarkable. Lungs/Pleura: Similar biapical pleuroparenchymal scarring. Chronically stable right middle lobe atelectasis. No suspicious pulmonary nodules or masses. No pleural effusion. No pneumothorax. Musculoskeletal: No aggressive lytic or blastic lesion of bone. CT ABDOMEN PELVIS FINDINGS Hepatobiliary: No suspicious hepatic lesion. Gallbladder is unremarkable. No biliary ductal dilation. Pancreas: No pancreatic ductal dilation or evidence of acute inflammation. Spleen: No splenomegaly or focal splenic lesion. Adrenals/Urinary Tract: Bilateral adrenal glands appear normal. No hydronephrosis. No solid enhancing renal mass. Urinary bladder is unremarkable for degree of distension. Stomach/Bowel: Radiopaque enteric contrast material traverses the rectum. Stomach is unremarkable for degree of distension. No pathologic dilation of small or large bowel. Normal appendix. No evidence of acute bowel inflammation. Vascular/Lymphatic: Previously indexed left periaortic retroperitoneal lymph node measures 12 x 8 mm on image 67/2 previously 14 x 9 mm. No additional pathologically enlarged abdominal or pelvic lymph nodes. Normal caliber abdominal aorta.  Aortic atherosclerosis. Reproductive: Status post hysterectomy. No adnexal masses. Other: No significant abdominopelvic free fluid. No discrete peritoneal or omental nodularity. Tiny fat containing left  inguinal hernia. Musculoskeletal: No acute or significant osseous findings. IMPRESSION: 1. Slightly decreased size of the small left retroperitoneal lymph node with stable superior mediastinal lymph node. 2. No new or progressive disease within the chest, abdomen or pelvis. 3.  Aortic Atherosclerosis (ICD10-I70.0). Electronically Signed   By: JDahlia BailiffM.D.   On: 11/29/2021 08:42   MR BRAIN W WO CONTRAST  Result Date: 11/27/2021 CLINICAL DATA:  Brain/CNS neoplasm, assess treatment response EXAM: MRI HEAD WITHOUT AND WITH CONTRAST TECHNIQUE: Multiplanar, multiecho pulse sequences of the brain and surrounding structures were obtained without and with intravenous contrast. CONTRAST:  952mMULTIHANCE GADOBENATE DIMEGLUMINE 529 MG/ML IV SOLN COMPARISON:  08/15/2021 FINDINGS: Brain: Postoperative changes are again identified in the lateral right precentral gyrus with hemosiderin lined resection cavity. No evidence of recurrent disease. Stable surrounding T2 FLAIR hyperintensity. Stable linear enhancement at the treated left parietal lesion site (series 13, image 127). No new mass or abnormal enhancement. There is no acute infarction. Prominence of the ventricles and sulci reflects stable parenchymal volume loss. Additional patchy hyperintensity in the supratentorial white matter reflects stable microvascular ischemic changes. Vascular: Major vessel flow voids at the skull base are preserved. Skull and upper cervical spine: Normal marrow signal is preserved. Right craniotomy. Partially imaged cervical  spine ACDF. Sinuses/Orbits: Paranasal sinuses are aerated. Orbits are unremarkable. Other: Sella is unremarkable.  Mastoid air cells are clear. IMPRESSION: Stable appearance without evidence of recurrent or new disease. Electronically Signed   By: Macy Mis M.D.   On: 11/27/2021 14:58   ECHOCARDIOGRAM COMPLETE  Result Date: 11/06/2021    ECHOCARDIOGRAM REPORT   Patient Name:   ASA FATH Date of Exam:  11/06/2021 Medical Rec #:  [id number]        Height:       65.0 in Accession #:    [id number]       Weight:       100.0 lb Date of Birth:  07/21/1950         BSA:          1.473 m Patient Age:    71 years         BP:           106/43 mmHg Patient Gender: F                HR:           60 bpm. Exam Location:  [place] Procedure: 2D Echo, Cardiac Doppler and Color Doppler Indications:    I49.3 PVC  History:        Patient has prior history of Echocardiogram examinations, most                 recent 07/25/2014. Arrythmias:LBBB. Mitral valve disorder. Atrial                 tachycardia, paroxysmal.  Sonographer:    Diamond Nickel RCS Referring Phys: [place]  1. Abnormal septal motion from LBBB PVC;s noted during study. Left ventricular ejection fraction, by estimation, is 50 to 55%. The left ventricle has low normal function. The left ventricle has no regional wall motion abnormalities. Left ventricular diastolic parameters are indeterminate.  2. Right ventricular systolic function is normal. The right ventricular size is normal.  3. The mitral valve is abnormal. Trivial mitral valve regurgitation. No evidence of mitral stenosis.  4. The aortic valve is tricuspid. There is mild calcification of the aortic valve. Aortic valve regurgitation is mild. Aortic valve sclerosis/calcification is present, without any evidence of aortic stenosis.  5. The inferior vena cava is normal in size with greater than 50% respiratory variability, suggesting right atrial pressure of 3 mmHg. FINDINGS  Left Ventricle: Abnormal septal motion from LBBB PVC;s noted during study. Left ventricular ejection fraction, by estimation, is 50 to 55%. The left ventricle has low normal function. The left ventricle has no regional wall motion abnormalities. The left ventricular internal cavity size was normal in size. There is no left ventricular hypertrophy. Left ventricular diastolic parameters are indeterminate. Right Ventricle:  The right ventricular size is normal. No increase in right ventricular wall thickness. Right ventricular systolic function is normal. Left Atrium: Left atrial size was normal in size. Right Atrium: Right atrial size was normal in size. Pericardium: There is no evidence of pericardial effusion. Mitral Valve: The mitral valve is abnormal. There is mild thickening of the mitral valve leaflet(s). Trivial mitral valve regurgitation. No evidence of mitral valve stenosis. Tricuspid Valve: The tricuspid valve is normal in structure. Tricuspid valve regurgitation is mild . No evidence of tricuspid stenosis. Aortic Valve: The aortic valve is tricuspid. There is mild calcification of the aortic valve. Aortic valve regurgitation is mild. Aortic valve sclerosis/calcification is present, without any evidence of  aortic stenosis. Pulmonic Valve: The pulmonic valve was normal in structure. Pulmonic valve regurgitation is mild. No evidence of pulmonic stenosis. Aorta: The aortic root is normal in size and structure. Venous: The inferior vena cava is normal in size with greater than 50% respiratory variability, suggesting right atrial pressure of 3 mmHg. IAS/Shunts: No atrial level shunt detected by color flow Doppler.  LEFT VENTRICLE PLAX 2D LVIDd:         3.60 cm   Diastology LVIDs:         2.70 cm   LV e' medial:    8.74 cm/s LV PW:         0.90 cm   LV E/e' medial:  9.2 LV IVS:        0.70 cm   LV e' lateral:   5.73 cm/s LVOT diam:     1.60 cm   LV E/e' lateral: 14.0 LV SV:         39 LV SV Index:   27 LVOT Area:     2.01 cm  RIGHT VENTRICLE RV Basal diam:  3.10 cm RV S prime:     14.60 cm/s TAPSE (M-mode): 2.0 cm RVSP:           24.3 mmHg LEFT ATRIUM             Index        RIGHT ATRIUM           Index LA diam:        2.30 cm 1.56 cm/m   RA Pressure: 8.00 mmHg LA Vol (A2C):   17.8 ml 12.08 ml/m  RA Area:     7.70 cm LA Vol (A4C):   12.8 ml 8.69 ml/m   RA Volume:   11.60 ml  7.87 ml/m LA Biplane Vol: 16.3 ml 11.06 ml/m   AORTIC VALVE LVOT Vmax:   92.60 cm/s LVOT Vmean:  55.200 cm/s LVOT VTI:    0.196 m  AORTA Ao Root diam: 2.50 cm Ao Asc diam:  2.70 cm MITRAL VALVE               TRICUSPID VALVE MV Area (PHT): 3.20 cm    TR Peak grad:   16.3 mmHg MV Decel Time: 237 msec    TR Vmax:        202.00 cm/s MV E velocity: 80.17 cm/s  Estimated RAP:  8.00 mmHg MV A velocity: 91.90 cm/s  RVSP:           24.3 mmHg MV E/A ratio:  0.87                            SHUNTS                            Systemic VTI:  0.20 m                            Systemic Diam: 1.60 cm Jenkins Rouge MD Electronically signed by Jenkins Rouge MD Signature Date/Time: 11/06/2021/11:50:44 AM    Final

## 2021-12-02 ENCOUNTER — Other Ambulatory Visit: Payer: Self-pay | Admitting: Radiation Therapy

## 2021-12-02 ENCOUNTER — Inpatient Hospital Stay: Payer: Medicare Other

## 2021-12-02 ENCOUNTER — Other Ambulatory Visit: Payer: Self-pay

## 2021-12-02 ENCOUNTER — Inpatient Hospital Stay (HOSPITAL_BASED_OUTPATIENT_CLINIC_OR_DEPARTMENT_OTHER): Payer: Medicare Other | Admitting: Internal Medicine

## 2021-12-02 VITALS — BP 128/67 | HR 85 | Temp 97.9°F | Resp 16 | Ht 65.0 in | Wt 98.9 lb

## 2021-12-02 DIAGNOSIS — T451X5A Adverse effect of antineoplastic and immunosuppressive drugs, initial encounter: Secondary | ICD-10-CM | POA: Diagnosis not present

## 2021-12-02 DIAGNOSIS — G620 Drug-induced polyneuropathy: Secondary | ICD-10-CM | POA: Diagnosis not present

## 2021-12-02 DIAGNOSIS — C7931 Secondary malignant neoplasm of brain: Secondary | ICD-10-CM | POA: Diagnosis not present

## 2021-12-02 DIAGNOSIS — R42 Dizziness and giddiness: Secondary | ICD-10-CM

## 2021-12-02 DIAGNOSIS — C482 Malignant neoplasm of peritoneum, unspecified: Secondary | ICD-10-CM | POA: Diagnosis not present

## 2021-12-02 MED ORDER — LEVETIRACETAM 500 MG PO TABS: 1000.0000 mg | ORAL_TABLET | Freq: Two times a day (BID) | ORAL | 3 refills | Status: DC

## 2021-12-02 NOTE — Progress Notes (Signed)
[facility] [place], [place] [address] [phone]   Interval Evaluation  Date of Service: 12/02/21 Patient Name: [name] [name] Patient MRN: [id number] Patient DOB: 1951/01/22 Provider: Ventura Sellers, MD  Identifying Statement:  [name] [name] is a 71 y.o. 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 Findings highly suspicious for peritoneal carcinomatosis. Recommend paracentesis for therapeutic and diagnostic purposes. I do not see an obvious primary lesion but there is some irregular enhancing soft tissue in the right adnexal area. CA 125 level may be helpful. 2. Mild surface irregularity involving the liver but I do not see any obvious changes of cirrhosis. No hepatic lesions.   11/23/2018 Tumor Marker   Patient's tumor was tested for the following markers: CA-125 Results of the tumor marker test revealed 253.   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   12/01/2018 Cancer Staging   Staging form: Ovary, Fallopian Tube, and Primary Peritoneal Carcinoma, AJCC 8th Edition - Clinical stage from 12/01/2018: Stage IVA (rcT3, cN0, pM1a) - Signed by [name], MD on 02/14/2021 Stage prefix: Recurrence   12/02/2018 Tumor Marker   Patient's tumor was tested for the following markers: CA-125 Results of the tumor marker test revealed 267   12/06/2018 - 05/16/2019 Chemotherapy   The patient had carboplatin and taxol for chemotherapy treatment.     12/08/2018 Procedure    Successful ultrasound-guided therapeutic paracentesis yielding 1.7 liters of peritoneal fluid.   12/24/2018 Procedure   Successful placement of a right IJ approach Power Port with ultrasound and fluoroscopic guidance. The catheter is ready for use   01/17/2019 Tumor Marker   Patient's tumor was tested for the following markers: CA-125 Results of the tumor marker test revealed 57.2   01/31/2019 Imaging   Significant decrease in peritoneal carcinomatosis since previous study. Interval resolution of ascites.   Focal area of parenchymal consolidation in central right middle lobe, which measures 3 cm. Differential diagnosis includes infectious or inflammatory process, atelectasis, and neoplasm. Recommend short-term follow-up by chest CT in 2-3 months.     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   Surgeon: Donaciano Eva    Operative Findings:  : grossly normal uterus, ovaries normal, few scattered peritoneal nodules (60mm) on serosa of uterus and tubes. The omentum (gastrocolic) was tethered to the mesentery with tumor (thin rind). Complete (optimal) resection of tumor with no gross residual disease.     02/17/2019 Pathology Results   FINAL MICROSCOPIC DIAGNOSIS: A. UTERUS, CERVIX, BILATERAL FALLOPIAN TUBES AND OVARIES, HYSTERECTOMY WITH SALPINGOOOPHORECTOMY: - Uterus: Endometrium: Inactive endometrium. No hyperplasia or malignancy. Myometrium: Unremarkable. No malignancy. Serosa: Metastatic carcinoma. No malignancy. - Cervix: Benign squamous and endocervical mucosa. No dysplasia or malignancy. - Left ovary and fallopian tube: Metastatic carcinoma. - Right ovary: No malignancy identified. - Right fallopian tube: Luminal tumor, see  comment. B. OMENTUM, RESECTION: -  High grade serous carcinoma. - Deposits up to at least 3 cm. - See oncology table. ONCOLOGY TABLE: OVARY or FALLOPIAN TUBE or PRIMARY PERITONEUM: Procedure: Hysterectomy with bilateral salpingo-oophorectomy and omentectomy. Specimen Integrity: Intact Tumor Site: Peritoneum Ovarian Surface Involvement (required only if applicable): Left ovary Fallopian Tube Surface Involvement (required only if applicable): Left Fallopian tube Tumor Size: Largest deposit 3 cm Histologic Type: High-grade serous carcinoma Histologic Grade: High-grade Implants (required for advanced stage serous/seromucinous borderline tumors only): Uterine serosa, left fallopian tube and ovary, omentum Other Tissue/ Organ Involvement: As above Largest Extrapelvic Peritoneal Focus (required only if applicable): 3 cm Peritoneal/Ascitic Fluid: Pre neoadjuvant [id number] positive for carcinoma. Treatment Effect (required only for high-grade serous carcinomas): Probably partial treatment effect in omental tissue. Regional Lymph Nodes: No lymph nodes submitted or found Pathologic Stage Classification (pTNM, AJCC 8th Edition): ypT3c, ypNX Representative Tumor Block: B5 Comment(s): There is surface involvement of the left ovary and fallopian tube but no primary tumor. Within the right fallopian tube lumen there is detached fragments of tumor but again no precursor lesion is noted in the right fallopian tube. Thus, the tumor is presumed primary peritoneal and staged as such.   03/21/2019 Tumor Marker   Patient's tumor was tested for the following markers: CA-125 Results of the tumor marker test revealed 15.1    Genetic Testing   Negative genetic testing. No pathogenic variants identified on the [facility] CancerNext + RNAinsight panel. VUS in ATM called [id number] identified. The report date is 05/12/2019. TumorNext HRD was originally ordered but there was not enough sample to complete this testing.   The  CancerNext+RNAinsight gene panel offered by [employer] [facility] includes sequencing and rearrangement analysis for the following 36 genes: APC*, ATM*, AXIN2, BARD1, BMPR1A, BRCA1*, BRCA2*, BRIP1*, CDH1*, CDK4, CDKN2A, CHEK2*, DICER1, MLH1*, MSH2*, MSH3, MSH6*, MUTYH*, NBN, NF1*, NTHL1, PALB2*, PMS2*, PTEN*, RAD51C*, RAD51D*, RECQL, SMAD4, SMARCA4, STK11 and TP53* (sequencing and deletion/duplication); HOXB13, POLD1 and POLE (sequencing only); EPCAM and GREM1 (deletion/duplication only). DNA and RNA analyses performed for * genes.    05/16/2019 Tumor Marker   Patient's tumor was tested for the following markers: CA-125 Results of the tumor marker test revealed 10.7   06/16/2019 Imaging   1. No evidence of residual or recurrent metastatic disease in the chest, abdomen or pelvis. 2. Masslike focus of consolidation in the right middle lobe along the major and minor fissures with associated volume loss is stable since 01/31/2019. Indolent primary pulmonary neoplasm not excluded. PET-CT may be considered for further characterization. 3. Aortic Atherosclerosis (ICD10-I70.0).   06/16/2019 Tumor Marker   Patient's tumor was tested for the following markers: CA-125 Results of the tumor marker test revealed 9.1.   12/05/2019 Tumor Marker   Patient's tumor was tested for the following markers: CA-125. Results of the tumor marker test revealed 9.0.   02/29/2020 Tumor Marker   Patient's tumor was tested for the following markers: CA-`125 Results of the tumor marker test revealed 9.7.   05/28/2020 Tumor Marker   Patient's tumor was tested for the following markers: CA-125 Results of the tumor marker test revealed 9.5   08/16/2020 Tumor Marker   Patient's tumor was tested for the following markers: CA-125 Results of the tumor marker test revealed 10.9   11/29/2020 Tumor Marker   Patient's tumor was tested for the following markers: CA-125. Results of the tumor marker test revealed 10.2.   01/22/2021 Pathology  Results   SURGICAL PATHOLOGY  CASE: [phone]  PATIENT: [name] [name]  Surgical Pathology Report  Clinical History: brain metastasis (cm)    FINAL MICROSCOPIC DIAGNOSIS:   A. 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    03/20/2021 Imaging   1. Unchanged enlarged midline superior mediastinal and left retroperitoneal lymph nodes, which remain suspicious for metastatic disease. 2. Unchanged masslike consolidation and volume loss of the right middle lobe measuring 3.8 x 1.9 cm, likely chronic scarring. Continued attention on follow-up. 3. No new evidence of metastatic disease in the chest, abdomen, or pelvis. 4. Status post hysterectomy and omentectomy.   Aortic Atherosclerosis (ICD10-I70.0).     03/21/2021 - 05/07/2021 Chemotherapy   Patient is on Treatment Plan : OVARIAN RECURRENT 3RD LINE Carboplatin D1 / Gemcitabine D1,8 (4/800) q21d     05/17/2021 Imaging   1. No new suspicious mass or lymphadenopathy identified in the chest, abdomen or pelvis. 2. Interval decreased size of previous enlarged superior mediastinal and left retroperitoneal lymph nodes. 3. Other ancillary findings as described.   05/21/2021 - 08/13/2021 Chemotherapy   Patient is on Treatment Plan : OVARIAN Gemcitabine D1,8 q21d      08/16/2021 Imaging   MRI brain  1. Regression of disease. Resolved marginal enhancement at the right middle frontal gyrus resection cavity and regressed enhancement at the treated left parietal lesion.   2. No new metastatic disease or acute intracranial abnormality.   08/19/2021 Imaging   Slight decrease in size of small left retroperitoneal and superior mediastinal lymph nodes.   No new or progressive disease within the chest, abdomen, or pelvis.   Aortic Atherosclerosis (ICD10-I70.0).     11/29/2021 Imaging   1. Slightly decreased size of the  small left retroperitoneal lymph node with stable superior mediastinal lymph node. 2. No new or progressive disease within the chest, abdomen or pelvis. 3.  Aortic Atherosclerosis (ICD10-I70.0).      CNS Oncologic History 01/22/21: R frontal craniotomy, resection with Dr. Reatha Armour 02/21/21: Post-op SRS and SRS to L parietal target Tammi Klippel)  Interval History: [name] [name] presents for follow up.  She complains today of several episodes of room spinning after bending forward, accompanied by left ear fullness.  No accompanying neurologic deficits during the vertigo spells, and they are always positional in nature.  Episodes of facial twitching c/w prior seizures are better controlled on the 157m twice per day Keppra, but she complains of increased "fogginess" and fatigue since the dose increase.  Has transitioned to observation with Dr. GAlvy Bimler  H+P (12/25/20) Patient presented to the ED on 12/22/20 with twitching of her left side of her face for 1 to 2 minutes, accompanied by slurring of speech.  There was no tongue biting, bowel or bladder incontinence.  Workup demonstrated two brain lesions consistent with suspected metastases.  She was started on Keppra (no decadron) and discharged two days later with no further seizure like events.  Priro to admission she had also been complaining of dizziness, confusion, impaired memory.  Has been seeing Dr. GAlvy Bimlersince 2021 for primary peritoneal carcinoma.   Medications: Current Outpatient Medications on File Prior to Visit  Medication Sig Dispense Refill   Azelaic Acid 15 % cream Apply 1 application topically daily.     cholecalciferol (VITAMIN D3) 25 MCG (1000 UNIT) tablet Take 2,000 Units by mouth daily.     gabapentin (NEURONTIN) 300 MG capsule Take 300 mg by mouth 3 (three) times daily.     levETIRAcetam (  KEPPRA) 500 MG tablet Take 3 tablets (1,500 mg total) by mouth 2 (two) times daily. 180 tablet 0   lidocaine-prilocaine (EMLA) cream Apply to  affected area(s) as needed. 30 g 3   Magnesium 250 MG TABS Take 250 mg by mouth daily as needed.     ondansetron (ZOFRAN) 8 MG tablet Take 1 tablet (8 mg total) by mouth every 8 (eight) hours as needed. 30 tablet 1   prochlorperazine (COMPAZINE) 10 MG tablet Take 1 tablet (10 mg total) by mouth every 6 (six) hours as needed (Nausea or vomiting). 30 tablet 1   Sulfacetamide Sodium, Acne, 10 % LOTN Apply 1 application topically at bedtime.     VITAMIN E PO Take 50 Int'l Units/day by mouth daily.     No current facility-administered medications on file prior to visit.    Allergies:  Allergies  Allergen Reactions   Carboplatin Itching and Rash    Patient has a hypersensitivity reaction to carboplatin.  See note from 04/30/2021 at 1439.  Reaction symptoms included itching of mouth and ears, burning in chest, hypotension, and rash to neck and arms.  Patient unable to complete infusion.   Fentanyl Palpitations    Palpitations , fever ,headahe, N/V  " loud pounding heart beat"    Vancomycin Rash   Milk (Cow) Diarrhea and Nausea And Vomiting   Brimonidine Tartrate     Other reaction(s): blurred vision   Brimonidine Tartrate     Other reaction(s): blurred vision   Ditropan [Oxybutynin]    Milk-Related Compounds Diarrhea and Nausea And Vomiting   Other Other (See Comments)   Oxybutynin Chloride Other (See Comments)    Ditropan  Pt doesn't remember    Thimerosal Other (See Comments)    Pt doesn't remember   Tramadol Other (See Comments)    Pt doesn't remember   Avelox [Moxifloxacin Hcl In Nacl] Rash   Doxycycline Rash   Erythromycin Rash   Metronidazole Rash   Minocycline Rash   Penicillins Rash    Did it involve swelling of the face/tongue/throat, SOB, or low BP? No Did it involve sudden or severe rash/hives, skin peeling, or any reaction on the inside of your mouth or nose? Yes, rash. Did you need to seek medical attention at a hospital or doctor's office? No When did it last happen?       Childhood allergy If all above answers are "NO", may proceed with cephalosporin use.    Quinolones Rash   Sulfonamide Derivatives Rash   Past Medical History:  Past Medical History:  Diagnosis Date   Allergic rhinitis    Aortic insufficiency    Mild echo 10/2021   Cervical dysplasia 1980   Cervicalgia    Eczema    Rosacea,dermatitis-Dr Stinehelfer   Esophageal reflux    Family history of colon cancer    Family history of melanoma    Family history of ovarian cancer    Family history of pancreatic cancer    Family history of prostate cancer    Family history of uterine cancer    Fibromyalgia    GERD (gastroesophageal reflux disease)    Glaucoma, narrow-angle    Heart murmur    Hiatal hernia    Irritable bowel syndrome    LBBB (left bundle branch block)    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), unspecified site    DCIS   Mass of lung    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 Arlyce Dice   Mitral valve disorders(424.0)  No antibiotics required   Osteoporosis 12/2016   T score -2.3, 2014 T score -2.5 AP spine   PAC (premature atrial contraction) 2012   ,PVC's, and nonsustained atril tachycardia w aberration by heart monitor    PVC (premature ventricular contraction)    Rosacea    Stricture and stenosis of esophagus    Past Surgical History:  Past Surgical History:  Procedure Laterality Date   APPLICATION OF CRANIAL NAVIGATION N/A 01/22/2021   Procedure: APPLICATION OF CRANIAL NAVIGATION;  Surgeon: Karsten Ro, DO;  Location: [facility];  Service: Neurosurgery;  Laterality: N/A;   BREAST LUMPECTOMY  1998   right breast with radiation   CERVICAL DISCECTOMY     C3-7 with bone graft, in three different surgeries, have titanium plate and screws   CRANIOTOMY N/A 01/22/2021   Procedure: STEREOTACTIC RIGHT FRONTAL CRANIOTOMY FOR RESECTION OF TUMOR;  Surgeon: Karsten Ro, DO;  Location: [facility];  Service: Neurosurgery;  Laterality: N/A;   GYNECOLOGIC CRYOSURGERY  1980   IR  IMAGING GUIDED PORT INSERTION  12/24/2018   NECK SURGERY  1993-1997   bone graft and fusion   OMENTECTOMY  02/17/2019   REFRACTIVE SURGERY     narrow angle glucoma   RHINOPLASTY  1976   sterotactic large core needle biopsy right breast Right 10/15/1996   TUBAL LIGATION  1977   Social History:  Social History   Socioeconomic History   Marital status: Married    Spouse name: Antony Haste   Number of children: 0   Years of education: Not on file   Highest education level: Not on file  Occupational History   Occupation: Teacher, early years/pre: [employer]  Tobacco Use   Smoking status: Never   Smokeless tobacco: Never  Vaping Use   Vaping Use: Never used  Substance and Sexual Activity   Alcohol use: Yes    Alcohol/week: 2.0 standard drinks of alcohol    Types: 2 Standard drinks or equivalent per week    Comment: Social    Drug use: No   Sexual activity: Not Currently    Birth control/protection: Post-menopausal    Comment: 1st intercourse 71 yo- More than 5 partners  Other Topics Concern   Not on file  Social History Narrative   Not on file   Social Determinants of Health   Financial Resource Strain: Not on file  Food Insecurity: Not on file  Transportation Needs: Not on file  Physical Activity: Not on file  Stress: Not on file  Social Connections: Not on file  Intimate Partner Violence: Not on file   Family History:  Family History  Problem Relation Age of Onset   Heart disease Mother    Depression Mother    Hypertension Mother    Heart disease Father    Hypertension Father    Aneurysm Father    Uterine cancer Sister 94   Hypertension Sister    Ovarian cancer Sister 49   Melanoma Sister 77   Depression Sister    Melanoma Sister 74   Heart disease Paternal Grandfather    Stroke Maternal Grandmother    Cancer Maternal Grandfather 63       Pancreatic   Stroke Paternal Grandmother    Cancer Paternal Uncle        multiple myeloma, prostate cancer and  melanoma (separate primaries)   Colon cancer Neg Hx     Review of Systems: Constitutional: Doesn't report fevers, chills or abnormal weight loss Eyes: Doesn't report  blurriness of vision Ears, nose, mouth, throat, and face: Doesn't report sore throat Respiratory: Doesn't report cough, dyspnea or wheezes Cardiovascular: Doesn't report palpitation, chest discomfort  Gastrointestinal:  Doesn't report nausea, constipation, diarrhea GU: Doesn't report incontinence Skin: Doesn't report skin rashes Neurological: Per HPI Musculoskeletal: Doesn't report joint pain Behavioral/Psych: Doesn't report anxiety  Physical Exam: Wt Readings from Last 3 Encounters:  11/29/21 100 lb (45.4 kg)  10/01/21 100 lb (45.4 kg)  09/12/21 100 lb 6.4 oz (45.5 kg)   Temp Readings from Last 3 Encounters:  10/01/21 (!) 97.4 F (36.3 C) (Tympanic)  09/12/21 98.1 F (36.7 C) (Oral)  08/20/21 (!) 97.5 F (36.4 C) (Tympanic)   BP Readings from Last 3 Encounters:  11/29/21 (!) 107/49  10/01/21 (!) 106/43  09/12/21 (!) 142/85   Pulse Readings from Last 3 Encounters:  11/29/21 87  10/01/21 80  09/12/21 96   KPS: 80. General: Alert, cooperative, pleasant, in no acute distress Head: Normal EENT: No conjunctival injection or scleral icterus.  Lungs: Resp effort normal Cardiac: Regular rate Abdomen: Non-distended abdomen Skin: No rashes cyanosis or petechiae. Extremities: No clubbing or edema  Neurologic Exam: Mental Status: Awake, alert, attentive to examiner. Oriented to self and environment. Language is fluent with intact comprehension.  Cranial Nerves: Visual acuity is grossly normal. Visual fields are full. Extra-ocular movements intact. No ptosis. Face is symmetric Motor: Tone and bulk are normal. Power is full in both arms and legs. Reflexes are symmetric, no pathologic reflexes present.  Sensory: Intact to light touch Gait: Normal.   Labs: I have reviewed the data as listed    Component  Value Date/Time   NA 138 11/28/2021 0850   K 4.1 11/28/2021 0850   CL 102 11/28/2021 0850   CO2 30 11/28/2021 0850   GLUCOSE 97 11/28/2021 0850   BUN 10 11/28/2021 0850   CREATININE 0.50 11/28/2021 0850   CALCIUM 9.9 11/28/2021 0850   PROT 7.3 11/28/2021 0850   ALBUMIN 4.6 11/28/2021 0850   AST 35 11/28/2021 0850   ALT 24 11/28/2021 0850   ALKPHOS 65 11/28/2021 0850   BILITOT 0.4 11/28/2021 0850   GFRNONAA >60 11/28/2021 0850   GFRAA >60 12/05/2019 1145   Lab Results  Component Value Date   WBC 5.7 11/28/2021   NEUTROABS 3.2 11/28/2021   HGB 12.5 11/28/2021   HCT 36.6 11/28/2021   MCV 90.1 11/28/2021   PLT 231 11/28/2021    Imaging:  [facility] Clinician Interpretation: I have personally reviewed the CNS images as listed.  My interpretation, in the context of the patient's clinical presentation, is stable disease  CT CHEST ABDOMEN PELVIS W CONTRAST  Result Date: 11/29/2021 CLINICAL DATA:  History of ovarian cancer, assess treatment response. * Tracking Code: BO * EXAM: CT CHEST, ABDOMEN, AND PELVIS WITH CONTRAST TECHNIQUE: Multidetector CT imaging of the chest, abdomen and pelvis was performed following the standard protocol during bolus administration of intravenous contrast. RADIATION DOSE REDUCTION: This exam was performed according to the departmental dose-optimization program which includes automated exposure control, adjustment of the mA and/or kV according to patient size and/or use of iterative reconstruction technique. CONTRAST:  190m OMNIPAQUE IOHEXOL 300 MG/ML  SOLN COMPARISON:  Multiple priors including most recent CT chest abdomen pelvis dated August 16, 2021 FINDINGS: CT CHEST FINDINGS Cardiovascular: Accessed right chest Port-A-Cath with tip near the superior cavoatrial junction. Aortic atherosclerosis. Normal caliber thoracic aorta without evidence of dissection. No central pulmonary embolus on this nondedicated study. Normal size heart. No significant  pericardial  effusion/thickening. Mediastinum/Nodes: No supraclavicular adenopathy. No suspicious thyroid nodule. Previously indexed prevascular superior mediastinal lymph node measures 5 mm in short axis on image 18/2, unchanged. No pathologically enlarged mediastinal, hilar or axillary lymph nodes. The esophagus is grossly unremarkable. Lungs/Pleura: Similar biapical pleuroparenchymal scarring. Chronically stable right middle lobe atelectasis. No suspicious pulmonary nodules or masses. No pleural effusion. No pneumothorax. Musculoskeletal: No aggressive lytic or blastic lesion of bone. CT ABDOMEN PELVIS FINDINGS Hepatobiliary: No suspicious hepatic lesion. Gallbladder is unremarkable. No biliary ductal dilation. Pancreas: No pancreatic ductal dilation or evidence of acute inflammation. Spleen: No splenomegaly or focal splenic lesion. Adrenals/Urinary Tract: Bilateral adrenal glands appear normal. No hydronephrosis. No solid enhancing renal mass. Urinary bladder is unremarkable for degree of distension. Stomach/Bowel: Radiopaque enteric contrast material traverses the rectum. Stomach is unremarkable for degree of distension. No pathologic dilation of small or large bowel. Normal appendix. No evidence of acute bowel inflammation. Vascular/Lymphatic: Previously indexed left periaortic retroperitoneal lymph node measures 12 x 8 mm on image 67/2 previously 14 x 9 mm. No additional pathologically enlarged abdominal or pelvic lymph nodes. Normal caliber abdominal aorta.  Aortic atherosclerosis. Reproductive: Status post hysterectomy. No adnexal masses. Other: No significant abdominopelvic free fluid. No discrete peritoneal or omental nodularity. Tiny fat containing left inguinal hernia. Musculoskeletal: No acute or significant osseous findings. IMPRESSION: 1. Slightly decreased size of the small left retroperitoneal lymph node with stable superior mediastinal lymph node. 2. No new or progressive disease within the chest, abdomen or  pelvis. 3.  Aortic Atherosclerosis (ICD10-I70.0). Electronically Signed   By: Dahlia Bailiff M.D.   On: 11/29/2021 08:42   MR BRAIN W WO CONTRAST  Result Date: 11/27/2021 CLINICAL DATA:  Brain/CNS neoplasm, assess treatment response EXAM: MRI HEAD WITHOUT AND WITH CONTRAST TECHNIQUE: Multiplanar, multiecho pulse sequences of the brain and surrounding structures were obtained without and with intravenous contrast. CONTRAST:  107m MULTIHANCE GADOBENATE DIMEGLUMINE 529 MG/ML IV SOLN COMPARISON:  08/15/2021 FINDINGS: Brain: Postoperative changes are again identified in the lateral right precentral gyrus with hemosiderin lined resection cavity. No evidence of recurrent disease. Stable surrounding T2 FLAIR hyperintensity. Stable linear enhancement at the treated left parietal lesion site (series 13, image 127). No new mass or abnormal enhancement. There is no acute infarction. Prominence of the ventricles and sulci reflects stable parenchymal volume loss. Additional patchy hyperintensity in the supratentorial white matter reflects stable microvascular ischemic changes. Vascular: Major vessel flow voids at the skull base are preserved. Skull and upper cervical spine: Normal marrow signal is preserved. Right craniotomy. Partially imaged cervical spine ACDF. Sinuses/Orbits: Paranasal sinuses are aerated. Orbits are unremarkable. Other: Sella is unremarkable.  Mastoid air cells are clear. IMPRESSION: Stable appearance without evidence of recurrent or new disease. Electronically Signed   By: PMacy MisM.D.   On: 11/27/2021 14:58   ECHOCARDIOGRAM COMPLETE  Result Date: 11/06/2021    ECHOCARDIOGRAM REPORT   Patient Name:   JWRETHA LARISDate of Exam: 11/06/2021 Medical Rec #:  [id number]       Height:       65.0 in Accession #:    [id number]      Weight:       100.0 lb Date of Birth:  706-08-1950        BSA:          1.473 m Patient Age:    89years         BP:  106/43 mmHg Patient Gender: F                 HR:           60 bpm. Exam Location:  [address] Procedure: 2D Echo, Cardiac Doppler and Color Doppler Indications:    I49.3 PVC  History:        Patient has prior history of Echocardiogram examinations, most                 recent 07/25/2014. Arrythmias:LBBB. Mitral valve disorder. Atrial                 tachycardia, paroxysmal.  Sonographer:    Diamond Nickel RCS Referring Phys: [place]  1. Abnormal septal motion from LBBB PVC;s noted during study. Left ventricular ejection fraction, by estimation, is 50 to 55%. The left ventricle has low normal function. The left ventricle has no regional wall motion abnormalities. Left ventricular diastolic parameters are indeterminate.  2. Right ventricular systolic function is normal. The right ventricular size is normal.  3. The mitral valve is abnormal. Trivial mitral valve regurgitation. No evidence of mitral stenosis.  4. The aortic valve is tricuspid. There is mild calcification of the aortic valve. Aortic valve regurgitation is mild. Aortic valve sclerosis/calcification is present, without any evidence of aortic stenosis.  5. The inferior vena cava is normal in size with greater than 50% respiratory variability, suggesting right atrial pressure of 3 mmHg. FINDINGS  Left Ventricle: Abnormal septal motion from LBBB PVC;s noted during study. Left ventricular ejection fraction, by estimation, is 50 to 55%. The left ventricle has low normal function. The left ventricle has no regional wall motion abnormalities. The left ventricular internal cavity size was normal in size. There is no left ventricular hypertrophy. Left ventricular diastolic parameters are indeterminate. Right Ventricle: The right ventricular size is normal. No increase in right ventricular wall thickness. Right ventricular systolic function is normal. Left Atrium: Left atrial size was normal in size. Right Atrium: Right atrial size was normal in size. Pericardium: There is no evidence  of pericardial effusion. Mitral Valve: The mitral valve is abnormal. There is mild thickening of the mitral valve leaflet(s). Trivial mitral valve regurgitation. No evidence of mitral valve stenosis. Tricuspid Valve: The tricuspid valve is normal in structure. Tricuspid valve regurgitation is mild . No evidence of tricuspid stenosis. Aortic Valve: The aortic valve is tricuspid. There is mild calcification of the aortic valve. Aortic valve regurgitation is mild. Aortic valve sclerosis/calcification is present, without any evidence of aortic stenosis. Pulmonic Valve: The pulmonic valve was normal in structure. Pulmonic valve regurgitation is mild. No evidence of pulmonic stenosis. Aorta: The aortic root is normal in size and structure. Venous: The inferior vena cava is normal in size with greater than 50% respiratory variability, suggesting right atrial pressure of 3 mmHg. IAS/Shunts: No atrial level shunt detected by color flow Doppler.  LEFT VENTRICLE PLAX 2D LVIDd:         3.60 cm   Diastology LVIDs:         2.70 cm   LV e' medial:    8.74 cm/s LV PW:         0.90 cm   LV E/e' medial:  9.2 LV IVS:        0.70 cm   LV e' lateral:   5.73 cm/s LVOT diam:     1.60 cm   LV E/e' lateral: 14.0 LV SV:  39 LV SV Index:   27 LVOT Area:     2.01 cm  RIGHT VENTRICLE RV Basal diam:  3.10 cm RV S prime:     14.60 cm/s TAPSE (M-mode): 2.0 cm RVSP:           24.3 mmHg LEFT ATRIUM             Index        RIGHT ATRIUM           Index LA diam:        2.30 cm 1.56 cm/m   RA Pressure: 8.00 mmHg LA Vol (A2C):   17.8 ml 12.08 ml/m  RA Area:     7.70 cm LA Vol (A4C):   12.8 ml 8.69 ml/m   RA Volume:   11.60 ml  7.87 ml/m LA Biplane Vol: 16.3 ml 11.06 ml/m  AORTIC VALVE LVOT Vmax:   92.60 cm/s LVOT Vmean:  55.200 cm/s LVOT VTI:    0.196 m  AORTA Ao Root diam: 2.50 cm Ao Asc diam:  2.70 cm MITRAL VALVE               TRICUSPID VALVE MV Area (PHT): 3.20 cm    TR Peak grad:   16.3 mmHg MV Decel Time: 237 msec    TR Vmax:         202.00 cm/s MV E velocity: 80.17 cm/s  Estimated RAP:  8.00 mmHg MV A velocity: 91.90 cm/s  RVSP:           24.3 mmHg MV E/A ratio:  0.87                            SHUNTS                            Systemic VTI:  0.20 m                            Systemic Diam: 1.60 cm Jenkins Rouge MD Electronically signed by Jenkins Rouge MD Signature Date/Time: 11/06/2021/11:50:44 AM    Final      Assessment/Plan Secondary malignant neoplasm of brain [facility])  Peripheral neuropathy due to chemotherapy [facility])  [name] [name] is clinically and radiographically stable today.  Given cognitive impairment with higher dose, and minimal quality of life intrusion with small focal seizures, should decrease Keppra 1018m BID for now.  For peripheral vertigo, ear and sinus symptoms, will place referral to ENT.  Will con't to follow with Dr. GAlvy Bimleras well.  We appreciate the opportunity to participate in the care of JZIYANA MORIKAWA  We ask that [name] PASCHALreturn to clinic in 4 months following next brain MRI, or sooner as needed.  All questions were answered. The patient knows to call the clinic with any problems, questions or concerns. No barriers to learning were detected.  The total time spent in the encounter was 30 minutes and more than 50% was on counseling and review of test results   ZVentura Sellers MD Medical Director of Neuro-Oncology [facility]at [place]09/18/23 8:41 AM

## 2021-12-04 ENCOUNTER — Telehealth: Payer: Self-pay

## 2021-12-04 DIAGNOSIS — C7931 Secondary malignant neoplasm of brain: Secondary | ICD-10-CM | POA: Diagnosis not present

## 2021-12-04 DIAGNOSIS — Z23 Encounter for immunization: Secondary | ICD-10-CM | POA: Diagnosis not present

## 2021-12-04 NOTE — Telephone Encounter (Signed)
Returned her call. She got the flu vaccine today. She is asking when she should get covid vaccine booster. Told her Dr. Alvy Bimler recommends that she waits 1 month after flu vaccine to get the covid vaccine. She verbalized understanding.

## 2021-12-05 ENCOUNTER — Other Ambulatory Visit: Payer: Self-pay | Admitting: Radiation Therapy

## 2021-12-09 ENCOUNTER — Encounter: Payer: Self-pay | Admitting: *Deleted

## 2021-12-10 ENCOUNTER — Other Ambulatory Visit: Payer: Self-pay | Admitting: *Deleted

## 2021-12-10 ENCOUNTER — Encounter: Payer: Self-pay | Admitting: *Deleted

## 2021-12-10 DIAGNOSIS — R42 Dizziness and giddiness: Secondary | ICD-10-CM

## 2021-12-10 NOTE — Progress Notes (Signed)
Patient requested ENT referral to go to Dr Janace Hoard with [facility].  Re-entered order.

## 2021-12-17 DIAGNOSIS — Z1231 Encounter for screening mammogram for malignant neoplasm of breast: Secondary | ICD-10-CM | POA: Diagnosis not present

## 2021-12-19 ENCOUNTER — Encounter: Payer: Self-pay | Admitting: Hematology and Oncology

## 2022-01-07 DIAGNOSIS — Z23 Encounter for immunization: Secondary | ICD-10-CM | POA: Diagnosis not present

## 2022-01-16 ENCOUNTER — Encounter: Payer: Self-pay | Admitting: Gynecologic Oncology

## 2022-01-16 DIAGNOSIS — H2513 Age-related nuclear cataract, bilateral: Secondary | ICD-10-CM | POA: Diagnosis not present

## 2022-01-16 DIAGNOSIS — H35372 Puckering of macula, left eye: Secondary | ICD-10-CM | POA: Diagnosis not present

## 2022-01-16 DIAGNOSIS — H43813 Vitreous degeneration, bilateral: Secondary | ICD-10-CM | POA: Diagnosis not present

## 2022-01-16 DIAGNOSIS — H40033 Anatomical narrow angle, bilateral: Secondary | ICD-10-CM | POA: Diagnosis not present

## 2022-01-16 DIAGNOSIS — H401421 Capsular glaucoma with pseudoexfoliation of lens, left eye, mild stage: Secondary | ICD-10-CM | POA: Diagnosis not present

## 2022-01-21 ENCOUNTER — Other Ambulatory Visit: Payer: Self-pay

## 2022-01-21 ENCOUNTER — Inpatient Hospital Stay: Payer: Medicare Other | Attending: Gynecologic Oncology | Admitting: Gynecologic Oncology

## 2022-01-21 ENCOUNTER — Encounter: Payer: Self-pay | Admitting: Gynecologic Oncology

## 2022-01-21 ENCOUNTER — Inpatient Hospital Stay: Payer: Medicare Other

## 2022-01-21 VITALS — BP 122/58 | HR 76 | Temp 98.4°F | Resp 18 | Ht 64.96 in | Wt 98.5 lb

## 2022-01-21 DIAGNOSIS — R410 Disorientation, unspecified: Secondary | ICD-10-CM | POA: Insufficient documentation

## 2022-01-21 DIAGNOSIS — Z452 Encounter for adjustment and management of vascular access device: Secondary | ICD-10-CM | POA: Insufficient documentation

## 2022-01-21 DIAGNOSIS — Z808 Family history of malignant neoplasm of other organs or systems: Secondary | ICD-10-CM

## 2022-01-21 DIAGNOSIS — Z923 Personal history of irradiation: Secondary | ICD-10-CM | POA: Insufficient documentation

## 2022-01-21 DIAGNOSIS — Z90722 Acquired absence of ovaries, bilateral: Secondary | ICD-10-CM | POA: Diagnosis not present

## 2022-01-21 DIAGNOSIS — C482 Malignant neoplasm of peritoneum, unspecified: Secondary | ICD-10-CM

## 2022-01-21 DIAGNOSIS — R5383 Other fatigue: Secondary | ICD-10-CM | POA: Diagnosis not present

## 2022-01-21 DIAGNOSIS — Z9221 Personal history of antineoplastic chemotherapy: Secondary | ICD-10-CM | POA: Insufficient documentation

## 2022-01-21 DIAGNOSIS — Z9079 Acquired absence of other genital organ(s): Secondary | ICD-10-CM | POA: Insufficient documentation

## 2022-01-21 DIAGNOSIS — Z8544 Personal history of malignant neoplasm of other female genital organs: Secondary | ICD-10-CM | POA: Insufficient documentation

## 2022-01-21 DIAGNOSIS — Z90710 Acquired absence of both cervix and uterus: Secondary | ICD-10-CM | POA: Insufficient documentation

## 2022-01-21 MED ORDER — HEPARIN SOD (PORK) LOCK FLUSH 100 UNIT/ML IV SOLN
500.0000 [IU] | Freq: Once | INTRAVENOUS | Status: AC
  Administered 2022-01-21: 500 [IU]

## 2022-01-21 MED ORDER — SODIUM CHLORIDE 0.9% FLUSH
10.0000 mL | Freq: Once | INTRAVENOUS | Status: AC
  Administered 2022-01-21: 10 mL

## 2022-01-21 NOTE — Progress Notes (Signed)
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 Findings highly suspicious for peritoneal carcinomatosis. Recommend paracentesis for therapeutic and diagnostic purposes. I do not see an obvious primary lesion but there is some irregular enhancing soft tissue in the right adnexal area. CA 125 level may be helpful. 2. Mild surface irregularity involving the liver but I do not see any obvious changes of cirrhosis. No hepatic lesions.   11/23/2018 Tumor Marker   Patient's tumor was tested for the following markers: CA-125 Results of the tumor marker test revealed 253.   11/25/2018 Initial Diagnosis   Peritoneal carcinoma ([place])   11/25/2018 Imaging   US paracentesis Successful ultrasound-guided paracentesis yielding 3.4 L of peritoneal fluid.   12/01/2018 Cancer Staging   Staging form: Ovary, Fallopian Tube, and Primary Peritoneal Carcinoma, AJCC 8th Edition - Clinical stage from 12/01/2018: Stage IVA (rcT3, cN0, pM1a) - Signed by [name], MD on 02/14/2021 Stage prefix: Recurrence   12/02/2018 Tumor Marker   Patient's tumor was tested for the following markers: CA-125 Results of the tumor marker test revealed 267   12/06/2018 - 05/16/2019 Chemotherapy   The patient had carboplatin and taxol for chemotherapy treatment.     12/08/2018 Procedure   Successful ultrasound-guided therapeutic paracentesis yielding 1.7 liters of peritoneal fluid.   12/24/2018 Procedure   Successful placement of a right IJ approach Power Port with ultrasound and fluoroscopic guidance. The catheter is ready for use   01/17/2019 Tumor Marker   Patient's tumor was tested for the  following markers: CA-125 Results of the tumor marker test revealed 57.2   01/31/2019 Imaging   Significant decrease in peritoneal carcinomatosis since previous study. Interval resolution of ascites.   Focal area of parenchymal consolidation in central right middle lobe, which measures 3 cm. Differential diagnosis includes infectious or inflammatory process, atelectasis, and neoplasm. Recommend short-term follow-up by chest CT in 2-3 months.     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   Surgeon: Donaciano Eva    Operative Findings:  : grossly normal uterus, ovaries normal, few scattered peritoneal nodules (37m) on serosa of uterus and tubes. The omentum (gastrocolic) was tethered to the mesentery with tumor (thin rind). Complete (optimal) resection of tumor with no gross residual disease.     02/17/2019 Pathology Results   FINAL MICROSCOPIC DIAGNOSIS: A. UTERUS, CERVIX, BILATERAL FALLOPIAN TUBES AND OVARIES, HYSTERECTOMY WITH SALPINGOOOPHORECTOMY: - Uterus: Endometrium: Inactive endometrium. No hyperplasia or malignancy. Myometrium: Unremarkable. No malignancy. Serosa: Metastatic carcinoma. No malignancy. - Cervix: Benign squamous and endocervical mucosa. No dysplasia or malignancy. - Left ovary and fallopian tube: Metastatic carcinoma. - Right ovary: No malignancy identified. - Right fallopian tube: Luminal tumor, see comment. B. OMENTUM, RESECTION: - High grade serous carcinoma. - Deposits up to at least 3 cm. - See oncology table. ONCOLOGY TABLE: OVARY or FALLOPIAN TUBE or PRIMARY PERITONEUM: Procedure: Hysterectomy with bilateral salpingo-oophorectomy and omentectomy. Specimen Integrity: Intact Tumor Site: Peritoneum Ovarian  Surface Involvement (required only if applicable): Left ovary Fallopian Tube Surface Involvement (required  only if applicable): Left Fallopian tube Tumor Size: Largest deposit 3 cm Histologic Type: High-grade serous carcinoma Histologic Grade: High-grade Implants (required for advanced stage serous/seromucinous borderline tumors only): Uterine serosa, left fallopian tube and ovary, omentum Other Tissue/ Organ Involvement: As above Largest Extrapelvic Peritoneal Focus (required only if applicable): 3 cm Peritoneal/Ascitic Fluid: Pre neoadjuvant [id number] positive for carcinoma. Treatment Effect (required only for high-grade serous carcinomas): Probably partial treatment effect in omental tissue. Regional Lymph Nodes: No lymph nodes submitted or found Pathologic Stage Classification (pTNM, AJCC 8th Edition): ypT3c, ypNX Representative Tumor Block: B5 Comment(s): There is surface involvement of the left ovary and fallopian tube but no primary tumor. Within the right fallopian tube lumen there is detached fragments of tumor but again no precursor lesion is noted in the right fallopian tube. Thus, the tumor is presumed primary peritoneal and staged as such.   03/21/2019 Tumor Marker   Patient's tumor was tested for the following markers: CA-125 Results of the tumor marker test revealed 15.1    Genetic Testing   Negative genetic testing. No pathogenic variants identified on the [facility] CancerNext + RNAinsight panel. VUS in ATM called [id number] identified. The report date is 05/12/2019. TumorNext HRD was originally ordered but there was not enough sample to complete this testing.   The CancerNext+RNAinsight gene panel offered by [facility] includes sequencing and rearrangement analysis for the following 36 genes: APC*, ATM*, AXIN2, BARD1, BMPR1A, BRCA1*, BRCA2*, BRIP1*, CDH1*, CDK4, CDKN2A, CHEK2*, DICER1, MLH1*, MSH2*, MSH3, MSH6*, MUTYH*, NBN, NF1*, NTHL1, PALB2*, PMS2*, PTEN*, RAD51C*, RAD51D*,  RECQL, SMAD4, SMARCA4, STK11 and TP53* (sequencing and deletion/duplication); HOXB13, POLD1 and POLE (sequencing only); EPCAM and GREM1 (deletion/duplication only). DNA and RNA analyses performed for * genes.    05/16/2019 Tumor Marker   Patient's tumor was tested for the following markers: CA-125 Results of the tumor marker test revealed 10.7   06/16/2019 Imaging   1. No evidence of residual or recurrent metastatic disease in the chest, abdomen or pelvis. 2. Masslike focus of consolidation in the right middle lobe along the major and minor fissures with associated volume loss is stable since 01/31/2019. Indolent primary pulmonary neoplasm not excluded. PET-CT may be considered for further characterization. 3. Aortic Atherosclerosis (ICD10-I70.0).   06/16/2019 Tumor Marker   Patient's tumor was tested for the following markers: CA-125 Results of the tumor marker test revealed 9.1.   12/05/2019 Tumor Marker   Patient's tumor was tested for the following markers: CA-125. Results of the tumor marker test revealed 9.0.   02/29/2020 Tumor Marker   Patient's tumor was tested for the following markers: CA-`125 Results of the tumor marker test revealed 9.7.   05/28/2020 Tumor Marker   Patient's tumor was tested for the following markers: CA-125 Results of the tumor marker test revealed 9.5   08/16/2020 Tumor Marker   Patient's tumor was tested for the following markers: CA-125 Results of the tumor marker test revealed 10.9   11/29/2020 Tumor Marker   Patient's tumor was tested for the following markers: CA-125. Results of the tumor marker test revealed 10.2.   01/22/2021 Pathology Results   SURGICAL PATHOLOGY  CASE: [id number]  PATIENT: [name] [name]  Surgical Pathology Report   Clinical History: brain metastasis (cm)    FINAL MICROSCOPIC DIAGNOSIS:   A. BRAIN TUMOR, RIGHT FRONTAL, RESECTION:  -  Metastatic adenocarcinoma  -  See comment   COMMENT:   Morphologically consistent  with the patient's history of serous carcinoma.    03/20/2021 Imaging   1.  Unchanged enlarged midline superior mediastinal and left retroperitoneal lymph nodes, which remain suspicious for metastatic disease. 2. Unchanged masslike consolidation and volume loss of the right middle lobe measuring 3.8 x 1.9 cm, likely chronic scarring. Continued attention on follow-up. 3. No new evidence of metastatic disease in the chest, abdomen, or pelvis. 4. Status post hysterectomy and omentectomy.   Aortic Atherosclerosis (ICD10-I70.0).     03/21/2021 - 05/07/2021 Chemotherapy   Patient is on Treatment Plan : OVARIAN RECURRENT 3RD LINE Carboplatin D1 / Gemcitabine D1,8 (4/800) q21d     05/17/2021 Imaging   1. No new suspicious mass or lymphadenopathy identified in the chest, abdomen or pelvis. 2. Interval decreased size of previous enlarged superior mediastinal and left retroperitoneal lymph nodes. 3. Other ancillary findings as described.   05/21/2021 - 08/13/2021 Chemotherapy   Patient is on Treatment Plan : OVARIAN Gemcitabine D1,8 q21d      08/16/2021 Imaging   MRI brain  1. Regression of disease. Resolved marginal enhancement at the right middle frontal gyrus resection cavity and regressed enhancement at the treated left parietal lesion.   2. No new metastatic disease or acute intracranial abnormality.   08/19/2021 Imaging   Slight decrease in size of small left retroperitoneal and superior mediastinal lymph nodes.   No new or progressive disease within the chest, abdomen, or pelvis.   Aortic Atherosclerosis (ICD10-I70.0).     11/29/2021 Imaging   1. Slightly decreased size of the small left retroperitoneal lymph node with stable superior mediastinal lymph node. 2. No new or progressive disease within the chest, abdomen or pelvis. 3.  Aortic Atherosclerosis (ICD10-I70.0).       She was in her usual state of health until the spring/summer of 2020 when she noticed increased abdominal girth and  some dyspnea upon exertion.  After Friday, September 4 she noted poor appetite and a rather significant increase in the abdominal distention.  She reported heartburn and intermittent nausea.  There had been no rectal or vaginal bleeding but she did report intermittent diarrhea.   [name] [name] was evaluated  in the ED for the complaint of abdominal distention on November 23, 2018.  A CT scan was notable for a slightly irregular liver contour.  Significant perihepatic ascites.  There was vague ill-defined enhancing appearing peritoneal lesions in the mesentery worrisome for peritoneal carcinomatosis.  The uterus and left ovary appeared normal but there was an ill-defined irregular enhancing soft tissue noted in the region of the right adnexa without discrete masses.  Large volume abdominal pelvic ascites was noted.  Ca1 25 was collected and returned at a value of 253.   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  She was diagnosed with stage 3C cancer and was recommended neoadjuvant chemotherapy for 3 cycles followed by interval cytoreductive surgery.    Neoadjuvant chemotherapy with carboplatin and paclitaxel began on 12/06/2018.   Day 1 of cycle 3 was administered on 01/17/2019. CA 125 on 01/17/19 was 57.2.   CT abd/pelvis on 01/31/19 showed significant decrease in peritoneal carcinomatosis since previous study. Interval resolution of ascites. Focal area of parenchymal consolidation in central right middle lobe, which measures 3 cm. Differential diagnosis includes infectious or inflammatory process, atelectasis, and neoplasm.   On February 17, 2019 she underwent interval debulking with a robotic assisted total hysterectomy with BSO, omentectomy, radical tumor debulking, mini laparotomy for omentectomy. Intraoperative findings were significant for a grossly normal uterus, normal ovaries, few  scattered  peritoneal nodules 1 mm on the serosa of the uterus and tubes.  The omentum and the gastrocolic region was tethered to the mesentery with tumor.  Surgery included a complete optimal resection with no gross residual disease at the end of the procedure. Surgery was uncomplicated.  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  There were metastatic implants on the serosa of the uterus, ovaries, and fallopian tubes in addition to metastatic foci greater than 2 cm in the omentum.   Somatic and germline BRCA and HRD testing was negative.    She received 3 additional cycles of carboplatin and paclitaxel completed in March, 2021.    CT imaging on 06/16/19 showed complete clinical response (stable pulmonary nodule).  CA 125 normalized to 9.1 on 06/16/19 CA 125 remained normal at 7.4 on 09/09/19. CA 125 was normal at 9.0 on 12/05/19. CA 125 remained normal at 9.7 on 02/29/20. CA 125 was 10.9 on 08/16/20.  CA-125 was 10.2 on 11/29/20. CA-125 was 10 on 12/23/20.  Interval History: Patient reports overall doing well.  She continues to struggle with some side effects related to previous cancer recurrence as well as treatment.  She endorses fatigue, vision problems, voice changes, and decreased concentration and confusion.  She follows with Dr. Mickeal Skinner and will be seeing ENT as it is suspected that some of her current symptoms may be unrelated to cancer in her brain surgery.  Patient reports overall decreased appetite but stable.  She make sure to eat foods that are high in protein.  She is happy with her current weight.  She endorses normal bowel function.  Bladder function is at baseline with some mild incontinence.  She denies any abdominal or pelvic pain.  She denies any vaginal bleeding or discharge.  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  She has some instability and dizziness since her brain surgery.  Does not use a walking device but frequently holds onto things like her husband and railings.  Past Medical/Surgical History: Past Medical History:  Diagnosis Date   Allergic rhinitis    Aortic insufficiency    Mild echo 10/2021   Cervical dysplasia 1980   Cervicalgia    Eczema    Rosacea,dermatitis-Dr Stinehelfer   Esophageal reflux    Family history of colon cancer    Family history of melanoma    Family history of ovarian cancer    Family history of pancreatic cancer    Family history of prostate cancer    Family history of uterine cancer    Fibromyalgia    GERD (gastroesophageal reflux disease)    Glaucoma, narrow-angle    Heart murmur    Hiatal hernia    Irritable bowel syndrome    LBBB (left bundle branch block)    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), unspecified site    DCIS   Mass of lung    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 Arlyce Dice   Mitral valve disorders(424.0)    No antibiotics required   Osteoporosis 12/2016   T score -2.3, 2014 T score -2.5 AP spine   PAC (premature atrial contraction) 2012   ,PVC's, and nonsustained atril tachycardia w aberration by heart monitor    PVC (premature ventricular contraction)    Rosacea    Stricture and stenosis of esophagus  Past Surgical History:  Procedure Laterality Date   APPLICATION OF CRANIAL NAVIGATION N/A 01/22/2021   Procedure: APPLICATION OF CRANIAL NAVIGATION;  Surgeon: Dawley, Theodoro Doing, DO;  Location: [facility];  Service: Neurosurgery;  Laterality: N/A;   BREAST LUMPECTOMY  1998   right breast with radiation   CERVICAL DISCECTOMY     C3-7 with bone graft, in three different surgeries, have titanium plate and screws   CRANIOTOMY N/A 01/22/2021   Procedure: STEREOTACTIC RIGHT FRONTAL CRANIOTOMY FOR RESECTION OF TUMOR;  Surgeon: Karsten Ro, DO;  Location: [facility];  Service: Neurosurgery;  Laterality: N/A;    GYNECOLOGIC CRYOSURGERY  1980   IR IMAGING GUIDED PORT INSERTION  12/24/2018   NECK SURGERY  1993-1997   bone graft and fusion   OMENTECTOMY  02/17/2019   REFRACTIVE SURGERY     narrow angle glucoma   RHINOPLASTY  1976   sterotactic large core needle biopsy right breast Right 10/15/1996   TUBAL LIGATION  1977    Family History  Problem Relation Age of Onset   Heart disease Mother    Depression Mother    Hypertension Mother    Heart disease Father    Hypertension Father    Aneurysm Father    Uterine cancer Sister 69   Hypertension Sister    Ovarian cancer Sister 56   Melanoma Sister 70   Depression Sister    Melanoma Sister 66   Heart disease Paternal Grandfather    Stroke Maternal Grandmother    Cancer Maternal Grandfather 28       Pancreatic   Stroke Paternal Grandmother    Cancer Paternal Uncle        multiple myeloma, prostate cancer and melanoma (separate primaries)   Colon cancer Neg Hx     Social History   Socioeconomic History   Marital status: Married    Spouse name: Antony Haste   Number of children: 0   Years of education: Not on file   Highest education level: Not on file  Occupational History   Occupation: Teacher, early years/pre: [employer]  Tobacco Use   Smoking status: Never   Smokeless tobacco: Never  Vaping Use   Vaping Use: Never used  Substance and Sexual Activity   Alcohol use: Yes    Alcohol/week: 2.0 standard drinks of alcohol    Types: 2 Standard drinks or equivalent per week    Comment: Social    Drug use: No   Sexual activity: Not Currently    Birth control/protection: Post-menopausal    Comment: 1st intercourse 71 yo- More than 5 partners  Other Topics Concern   Not on file  Social History Narrative   Not on file   Social Determinants of Health   Financial Resource Strain: Not on file  Food Insecurity: Not on file  Transportation Needs: Not on file  Physical Activity: Not on file  Stress: Not on file  Social  Connections: Not on file    Current Medications:  Current Outpatient Medications:    Azelaic Acid 15 % cream, Apply 1 application topically daily., Disp: , Rfl:    cholecalciferol (VITAMIN D3) 25 MCG (1000 UNIT) tablet, Take 2,000 Units by mouth daily., Disp: , Rfl:    gabapentin (NEURONTIN) 300 MG capsule, Take 300 mg by mouth 3 (three) times daily., Disp: , Rfl:    levETIRAcetam (KEPPRA) 500 MG tablet, Take 2 tablets (1,000 mg total) by mouth 2 (two) times daily., Disp: 120 tablet, Rfl: 3  lidocaine-prilocaine (EMLA) cream, Apply to affected area(s) as needed., Disp: 30 g, Rfl: 3   Magnesium 250 MG TABS, Take 250 mg by mouth daily as needed., Disp: , Rfl:    prochlorperazine (COMPAZINE) 10 MG tablet, Take 1 tablet (10 mg total) by mouth every 6 (six) hours as needed (Nausea or vomiting)., Disp: 30 tablet, Rfl: 1   Sulfacetamide Sodium, Acne, 10 % LOTN, Apply 1 application topically at bedtime., Disp: , Rfl:   Review of Systems: + fatigue, vision problems, voice changes, decreased concentration, confusion Denies appetite changes, fevers, chills, unexplained weight changes. Denies hearing loss, neck lumps or masses, mouth sores, ringing in ears. Denies cough or wheezing.  Denies shortness of breath. Denies chest pain or palpitations. Denies leg swelling. Denies abdominal distention, pain, blood in stools, constipation, diarrhea, nausea, vomiting, or early satiety. Denies pain with intercourse, dysuria, frequency, hematuria or incontinence. Denies hot flashes, pelvic pain, vaginal bleeding or vaginal discharge.   Denies joint pain, back pain or muscle pain/cramps. Denies itching, rash, or wounds. Denies dizziness, headaches, numbness or seizures. Denies swollen lymph nodes or glands, denies easy bruising or bleeding. Denies anxiety, depression.  Physical Exam: BP (!) 122/58 (BP Location: Left Arm, Patient Position: Sitting)   Pulse 76   Temp 98.4 F (36.9 C) (Oral)   Resp 18   Ht  5' 4.96" (1.65 m)   Wt 98 lb 8 oz (44.7 kg)   SpO2 100%   BMI 16.41 kg/m  General: Alert, oriented, no acute distress. HEENT: Normocephalic, atraumatic, sclera anicteric. Chest: Clear to auscultation bilaterally.  No wheezes or rhonchi. Cardiovascular: Regular rate and rhythm, no murmurs. Breast: No masses, lumps, or skin changes.  No adenopathy. Abdomen: soft, nontender.  Normoactive bowel sounds.  No masses or hepatosplenomegaly appreciated.  Well-healed incisions. Extremities: Grossly normal range of motion.  Warm, well perfused.  No edema bilaterally. Skin: No rashes or lesions noted. Lymphatics: No cervical, supraclavicular, or inguinal adenopathy. GU: Normal appearing external genitalia without erythema, excoriation, or lesions.  Small opening along the outer portion of the right labia majora that looks consistent with a prior ingrown hair.  Speculum exam reveals moderately atrophic vaginal mucosa, cuff intact, no lesions.  Bimanual exam reveals cuff intact, no masses or nodularity.  Rectovaginal exam confirms findings.  Laboratory & Radiologic Studies: None new  Recent mammogram: BIRADS 2.  Assessment & Plan: [name] [name] is a 71 y.o. woman with a history of stage IIIC primary peritoneal cancer (BRCA negative), s/p 6 cycles of carboplatin and paclitaxel chemotherapy and debulking surgery. Therapy completed April, 2021. Complete clinical response.  Somatic and germline testing negative. Recurrence in 12/2020 with brain metastases, mediastinal and retroperitoneal adenopathy. S/p stereotactic right frontal craniotomy in 01/2021, palliative RT, and carbo/gemzar completed in 07/2021.  Overall doing well and is NED on exam today.  She follows with Dr. Alvy Bimler as well as Dr. Mickeal Skinner given side effects secondary to cancer recurrence and brain surgery.  She desires annual speculum examination due to her sister's history of vulvar melanoma.  We discussed continuing with regular follow-up  every 6 months which will include a pelvic exam.  She follows regularly with a dermatologist.  CA-125 was not a marker for her recent recurrence.  We discussed that this lab could be followed still as it was a marker for her on presentation at the time of her initial diagnosis.  Since it was not drawn at the time of her port flush today, plan to get it when she comes  in January.   Per NCCN surveillance recommendations, we will continue with visits every 3 months.  She sees Dr. Alvy Bimler in mid January.  I will plan to see her back in early April.  I stressed the importance of calling if she develops any new and concerning symptoms before her next visit.  26 minutes of total time was spent for this patient encounter, including preparation, face-to-face counseling with the patient and coordination of care, and documentation of the encounter.  Jeral Pinch, MD  Division of Gynecologic Oncology  Department of Obstetrics and Gynecology  [facility]

## 2022-01-21 NOTE — Patient Instructions (Addendum)
It was good to see you today.  I do not see or feel any evidence of cancer recurrence on your exam.  We will plan to get a CA-125 at your next port flush/lab draw.  We will continue with visits every 3 months. You see Dr. Alvy Bimler in January. I will see you for follow-up in April.  As always, if you develop any new and concerning symptoms before your next visit, please call to see me sooner.

## 2022-02-05 ENCOUNTER — Telehealth: Payer: Self-pay

## 2022-02-05 NOTE — Telephone Encounter (Signed)
I reached out to Ms.Stockley today to schedule her follow up appointment for April 2024, per Dr. Charisse March note.  At this time pt has declined scheduling a follow up appointment stating she is supposed to see Dr.Gorsuch in January and would like to discuss the need to continue seeing Berline Lopes every 6 months as opposed to yearly, which she was under the impression she was to do.

## 2022-02-12 ENCOUNTER — Encounter: Payer: Self-pay | Admitting: Hematology and Oncology

## 2022-02-19 ENCOUNTER — Other Ambulatory Visit (HOSPITAL_COMMUNITY): Payer: Self-pay

## 2022-03-13 DIAGNOSIS — H903 Sensorineural hearing loss, bilateral: Secondary | ICD-10-CM | POA: Diagnosis not present

## 2022-03-13 DIAGNOSIS — H838X3 Other specified diseases of inner ear, bilateral: Secondary | ICD-10-CM | POA: Diagnosis not present

## 2022-03-13 DIAGNOSIS — R42 Dizziness and giddiness: Secondary | ICD-10-CM | POA: Diagnosis not present

## 2022-03-13 DIAGNOSIS — H6992 Unspecified Eustachian tube disorder, left ear: Secondary | ICD-10-CM | POA: Diagnosis not present

## 2022-03-18 ENCOUNTER — Encounter: Payer: Self-pay | Admitting: Hematology and Oncology

## 2022-03-25 ENCOUNTER — Ambulatory Visit (HOSPITAL_COMMUNITY)
Admission: RE | Admit: 2022-03-25 | Discharge: 2022-03-25 | Disposition: A | Discharge status: Home / Self Care | Payer: Medicare Other | Source: Ambulatory Visit | Attending: Hematology and Oncology | Admitting: Hematology and Oncology

## 2022-03-25 ENCOUNTER — Encounter: Payer: Self-pay | Admitting: Hematology and Oncology

## 2022-03-25 ENCOUNTER — Inpatient Hospital Stay: Payer: Medicare Other | Attending: Gynecologic Oncology

## 2022-03-25 ENCOUNTER — Other Ambulatory Visit: Payer: Self-pay

## 2022-03-25 DIAGNOSIS — Z8041 Family history of malignant neoplasm of ovary: Secondary | ICD-10-CM | POA: Insufficient documentation

## 2022-03-25 DIAGNOSIS — C482 Malignant neoplasm of peritoneum, unspecified: Secondary | ICD-10-CM | POA: Diagnosis not present

## 2022-03-25 DIAGNOSIS — R5383 Other fatigue: Secondary | ICD-10-CM | POA: Insufficient documentation

## 2022-03-25 DIAGNOSIS — Z800 Family history of malignant neoplasm of digestive organs: Secondary | ICD-10-CM | POA: Insufficient documentation

## 2022-03-25 DIAGNOSIS — C569 Malignant neoplasm of unspecified ovary: Secondary | ICD-10-CM | POA: Diagnosis not present

## 2022-03-25 DIAGNOSIS — H538 Other visual disturbances: Secondary | ICD-10-CM | POA: Insufficient documentation

## 2022-03-25 DIAGNOSIS — Z823 Family history of stroke: Secondary | ICD-10-CM | POA: Insufficient documentation

## 2022-03-25 DIAGNOSIS — Z853 Personal history of malignant neoplasm of breast: Secondary | ICD-10-CM | POA: Insufficient documentation

## 2022-03-25 DIAGNOSIS — I700 Atherosclerosis of aorta: Secondary | ICD-10-CM | POA: Insufficient documentation

## 2022-03-25 DIAGNOSIS — Z79899 Other long term (current) drug therapy: Secondary | ICD-10-CM | POA: Insufficient documentation

## 2022-03-25 DIAGNOSIS — Z881 Allergy status to other antibiotic agents status: Secondary | ICD-10-CM | POA: Insufficient documentation

## 2022-03-25 DIAGNOSIS — Z8042 Family history of malignant neoplasm of prostate: Secondary | ICD-10-CM | POA: Insufficient documentation

## 2022-03-25 DIAGNOSIS — Z8249 Family history of ischemic heart disease and other diseases of the circulatory system: Secondary | ICD-10-CM | POA: Insufficient documentation

## 2022-03-25 DIAGNOSIS — Z882 Allergy status to sulfonamides status: Secondary | ICD-10-CM | POA: Insufficient documentation

## 2022-03-25 DIAGNOSIS — Z8049 Family history of malignant neoplasm of other genital organs: Secondary | ICD-10-CM | POA: Insufficient documentation

## 2022-03-25 DIAGNOSIS — Z808 Family history of malignant neoplasm of other organs or systems: Secondary | ICD-10-CM | POA: Insufficient documentation

## 2022-03-25 DIAGNOSIS — Z8544 Personal history of malignant neoplasm of other female genital organs: Secondary | ICD-10-CM | POA: Insufficient documentation

## 2022-03-25 DIAGNOSIS — Z880 Allergy status to penicillin: Secondary | ICD-10-CM | POA: Insufficient documentation

## 2022-03-25 DIAGNOSIS — J984 Other disorders of lung: Secondary | ICD-10-CM | POA: Diagnosis not present

## 2022-03-25 DIAGNOSIS — Z885 Allergy status to narcotic agent status: Secondary | ICD-10-CM | POA: Insufficient documentation

## 2022-03-25 LAB — CMP (CANCER CENTER ONLY)
ALT: 17 U/L (ref 0–44)
AST: 24 U/L (ref 15–41)
Albumin: 4.6 g/dL (ref 3.5–5.0)
Alkaline Phosphatase: 66 U/L (ref 38–126)
Anion gap: 9 (ref 5–15)
BUN: 12 mg/dL (ref 8–23)
CO2: 28 mmol/L (ref 22–32)
Calcium: 9.9 mg/dL (ref 8.9–10.3)
Chloride: 101 mmol/L (ref 98–111)
Creatinine: 0.54 mg/dL (ref 0.44–1.00)
GFR, Estimated: >60 mL/min (ref >60)
Glucose, Bld: 83 mg/dL (ref 70–99)
Potassium: 3.8 mmol/L (ref 3.5–5.1)
Sodium: 138 mmol/L (ref 135–145)
Total Bilirubin: 0.6 mg/dL (ref 0.3–1.2)
Total Protein: 7.7 g/dL (ref 6.5–8.1)

## 2022-03-25 LAB — CBC WITH DIFFERENTIAL (CANCER CENTER ONLY)
Abs Immature Granulocytes: 0.01 10*3/uL (ref 0.00–0.07)
Basophils Absolute: 0 10*3/uL (ref 0.0–0.1)
Basophils Relative: 1 %
Eosinophils Absolute: 0.1 10*3/uL (ref 0.0–0.5)
Eosinophils Relative: 2 %
HCT: 36.5 % (ref 36.0–46.0)
Hemoglobin: 12.6 g/dL (ref 12.0–15.0)
Immature Granulocytes: 0 %
Lymphocytes Relative: 32 %
Lymphs Abs: 1.6 10*3/uL (ref 0.7–4.0)
MCH: 30.9 pg (ref 26.0–34.0)
MCHC: 34.5 g/dL (ref 30.0–36.0)
MCV: 89.5 fL (ref 80.0–100.0)
Monocytes Absolute: 0.4 10*3/uL (ref 0.1–1.0)
Monocytes Relative: 8 %
Neutro Abs: 3 10*3/uL (ref 1.7–7.7)
Neutrophils Relative %: 57 %
Platelet Count: 223 10*3/uL (ref 150–400)
RBC: 4.08 MIL/uL (ref 3.87–5.11)
RDW: 13.1 % (ref 11.5–15.5)
WBC Count: 5.2 10*3/uL (ref 4.0–10.5)
nRBC: 0 % (ref 0.0–0.2)

## 2022-03-25 MED ORDER — HEPARIN SOD (PORK) LOCK FLUSH 100 UNIT/ML IV SOLN: 500.0000 [IU] | Freq: Once | INTRAVENOUS | Status: DC

## 2022-03-25 MED ORDER — HEPARIN SOD (PORK) LOCK FLUSH 100 UNIT/ML IV SOLN
INTRAVENOUS | Status: AC
  Filled 2022-03-25: qty 5

## 2022-03-25 MED ORDER — SODIUM CHLORIDE 0.9% FLUSH
10.0000 mL | Freq: Once | INTRAVENOUS | Status: AC
  Administered 2022-03-25: 10 mL

## 2022-03-25 MED ORDER — IOHEXOL 300 MG/ML  SOLN
100.0000 mL | Freq: Once | INTRAMUSCULAR | Status: AC | PRN
  Administered 2022-03-25: 100 mL via INTRAVENOUS

## 2022-03-26 ENCOUNTER — Telehealth: Payer: Self-pay | Admitting: Oncology

## 2022-03-26 LAB — CA 125: Cancer Antigen (CA) 125: 10.7 U/mL (ref 0.0–38.1)

## 2022-03-26 NOTE — Telephone Encounter (Signed)
[name] [name] regarding scheduling a follow up appointment in April or May with Dr. Berline Lopes.  She would like to discuss the frequency of follow up with Dr. Alvy Bimler tomorrow and will let me know.

## 2022-03-27 ENCOUNTER — Ambulatory Visit: Payer: Medicare Other | Admitting: Cardiology

## 2022-03-27 ENCOUNTER — Inpatient Hospital Stay (HOSPITAL_BASED_OUTPATIENT_CLINIC_OR_DEPARTMENT_OTHER): Payer: Medicare Other | Admitting: Hematology and Oncology

## 2022-03-27 ENCOUNTER — Encounter: Payer: Self-pay | Admitting: Hematology and Oncology

## 2022-03-27 ENCOUNTER — Other Ambulatory Visit (HOSPITAL_COMMUNITY): Payer: Self-pay

## 2022-03-27 ENCOUNTER — Other Ambulatory Visit: Payer: Self-pay

## 2022-03-27 VITALS — BP 129/53 | HR 78 | Temp 97.6°F | Resp 18 | Ht 64.96 in | Wt 99.6 lb

## 2022-03-27 DIAGNOSIS — C7931 Secondary malignant neoplasm of brain: Secondary | ICD-10-CM | POA: Diagnosis not present

## 2022-03-27 DIAGNOSIS — Z885 Allergy status to narcotic agent status: Secondary | ICD-10-CM | POA: Diagnosis not present

## 2022-03-27 DIAGNOSIS — Z882 Allergy status to sulfonamides status: Secondary | ICD-10-CM | POA: Diagnosis not present

## 2022-03-27 DIAGNOSIS — Z8544 Personal history of malignant neoplasm of other female genital organs: Secondary | ICD-10-CM | POA: Diagnosis not present

## 2022-03-27 DIAGNOSIS — H538 Other visual disturbances: Secondary | ICD-10-CM

## 2022-03-27 DIAGNOSIS — R5383 Other fatigue: Secondary | ICD-10-CM | POA: Diagnosis not present

## 2022-03-27 DIAGNOSIS — Z8041 Family history of malignant neoplasm of ovary: Secondary | ICD-10-CM | POA: Diagnosis not present

## 2022-03-27 DIAGNOSIS — Z880 Allergy status to penicillin: Secondary | ICD-10-CM | POA: Diagnosis not present

## 2022-03-27 DIAGNOSIS — Z8049 Family history of malignant neoplasm of other genital organs: Secondary | ICD-10-CM | POA: Diagnosis not present

## 2022-03-27 DIAGNOSIS — I700 Atherosclerosis of aorta: Secondary | ICD-10-CM | POA: Diagnosis not present

## 2022-03-27 DIAGNOSIS — Z853 Personal history of malignant neoplasm of breast: Secondary | ICD-10-CM | POA: Diagnosis not present

## 2022-03-27 DIAGNOSIS — Z881 Allergy status to other antibiotic agents status: Secondary | ICD-10-CM | POA: Diagnosis not present

## 2022-03-27 DIAGNOSIS — Z823 Family history of stroke: Secondary | ICD-10-CM | POA: Diagnosis not present

## 2022-03-27 DIAGNOSIS — Z79899 Other long term (current) drug therapy: Secondary | ICD-10-CM | POA: Diagnosis not present

## 2022-03-27 DIAGNOSIS — Z8042 Family history of malignant neoplasm of prostate: Secondary | ICD-10-CM | POA: Diagnosis not present

## 2022-03-27 DIAGNOSIS — C482 Malignant neoplasm of peritoneum, unspecified: Secondary | ICD-10-CM

## 2022-03-27 DIAGNOSIS — Z800 Family history of malignant neoplasm of digestive organs: Secondary | ICD-10-CM | POA: Diagnosis not present

## 2022-03-27 DIAGNOSIS — Z8249 Family history of ischemic heart disease and other diseases of the circulatory system: Secondary | ICD-10-CM | POA: Diagnosis not present

## 2022-03-27 DIAGNOSIS — Z808 Family history of malignant neoplasm of other organs or systems: Secondary | ICD-10-CM | POA: Diagnosis not present

## 2022-03-27 NOTE — Assessment & Plan Note (Signed)
This has been going on even before discontinuation of treatment I would defer to her ophthalmologist for monitoring

## 2022-03-27 NOTE — Progress Notes (Signed)
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[facility]) We spent majority of our time reviewing CT imaging and discussed findings The lymphadenopathy discussed on CT imaging and not consider enlarged or abnormal Overall, she has no signs of systemic cancer recurrence I plan to repeat imaging study again in 4 months We discussed future follow-up The patient would like to keep appointment with GYN surgeon for vaginal exam due to family history of vaginal melanoma  Blurred vision This has been going on even before discontinuation of treatment I would defer to her ophthalmologist for monitoring  Secondary malignant neoplasm of brain [facility]) She has no focal neurological deficits She will continue monitoring and follow-up with neuro oncologist She is scheduled for MRI tomorrow  Orders Placed This Encounter  Procedures   CT CHEST ABDOMEN PELVIS W CONTRAST    Standing Status:   Future    Standing Expiration Date:   03/28/2023    Order Specific Question:   Preferred imaging location?    Answer:   [facility]    Order Specific Question:   Radiology Contrast Protocol - do NOT remove file path    Answer:   [email or web address]   CBC with Differential ([place] Only)    Standing Status:   Future    Standing Expiration Date:   03/28/2023   CMP ([place] only)    Standing Status:   Future    Standing Expiration Date:   03/28/2023    All questions were answered. The patient knows to call the clinic with any problems, questions or concerns. The total time spent in the appointment was 30 minutes encounter with patients including review of chart and various tests results, discussions about plan of care and coordination of care plan   [name], MD 03/27/2022 9:54  AM  INTERVAL HISTORY: Please see below for problem oriented charting. she returns for surveillance follow-up Her other symptoms of fatigue, visual changes, and head congestions are stable/unrelated We spent majority of the time reviewing multiple imaging studies and discussed future follow-up  REVIEW OF SYSTEMS:   Constitutional: Denies fevers, chills or abnormal weight loss Ears, nose, mouth, throat, and face: Denies mucositis or sore throat Respiratory: Denies cough, dyspnea or wheezes Cardiovascular: Denies palpitation, chest discomfort or lower extremity swelling Gastrointestinal:  Denies nausea, heartburn or change in bowel habits Skin: Denies abnormal skin rashes Lymphatics: Denies new lymphadenopathy or easy bruising Neurological:Denies numbness, tingling or new weaknesses Behavioral/Psych: Mood is stable, no new changes  All other systems were reviewed with the patient and are negative.  I have reviewed the past medical history, past surgical history, social history and family history with the patient and they are unchanged from previous note.  ALLERGIES:  is allergic to carboplatin, fentanyl, vancomycin, milk (cow), brimonidine tartrate, brimonidine tartrate, ditropan [oxybutynin], milk-related compounds, other, oxybutynin chloride, thimerosal (thiomersal), tramadol, avelox [moxifloxacin hcl in nacl], doxycycline, erythromycin, metronidazole, minocycline, penicillins, quinolones, and sulfonamide derivatives.  MEDICATIONS:  Current Outpatient Medications  Medication Sig Dispense Refill   Azelaic Acid 15 % cream Apply 1 application topically daily.     cholecalciferol (VITAMIN D3) 25 MCG (1000 UNIT) tablet Take 2,000 Units by mouth daily.     gabapentin (NEURONTIN) 300 MG capsule Take 300 mg by mouth 3 (three) times daily.     levETIRAcetam (KEPPRA) 500 MG tablet  Take 2 tablets (1,000 mg total) by mouth 2 (two) times daily. 120 tablet 3   lidocaine-prilocaine (EMLA) cream Apply  to affected area(s) as needed. 30 g 3   Magnesium 250 MG TABS Take 250 mg by mouth daily as needed.     prochlorperazine (COMPAZINE) 10 MG tablet Take 1 tablet (10 mg total) by mouth every 6 (six) hours as needed (Nausea or vomiting). 30 tablet 1   Sulfacetamide Sodium, Acne, 10 % LOTN Apply 1 application topically at bedtime.     No current facility-administered medications for this visit.    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 Findings highly suspicious for peritoneal carcinomatosis. Recommend paracentesis for therapeutic and diagnostic purposes. I do not see an obvious primary lesion but there is some irregular enhancing soft tissue in the right adnexal area. CA 125 level may be helpful. 2. Mild surface irregularity involving the liver but I do not see any obvious changes of cirrhosis. No hepatic lesions.   11/23/2018 Tumor Marker   Patient's tumor was tested for the following markers: CA-125 Results of the tumor marker test revealed 253.   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   12/01/2018 Cancer Staging   Staging form: Ovary, Fallopian Tube, and Primary Peritoneal Carcinoma, AJCC 8th Edition - Clinical stage from 12/01/2018: Stage IVA (rcT3, cN0, pM1a) - Signed by [name], MD on 02/14/2021 Stage prefix: Recurrence   12/02/2018 Tumor Marker   Patient's tumor was tested for the following markers: CA-125 Results of the tumor marker test revealed 267   12/06/2018 - 05/16/2019 Chemotherapy   The patient had carboplatin and taxol for chemotherapy treatment.     12/08/2018 Procedure   Successful ultrasound-guided therapeutic  paracentesis yielding 1.7 liters of peritoneal fluid.   12/24/2018 Procedure   Successful placement of a right IJ approach Power Port with ultrasound and fluoroscopic guidance. The catheter is ready for use   01/17/2019 Tumor Marker   Patient's tumor was tested for the following markers: CA-125 Results of the tumor marker test revealed 57.2   01/31/2019 Imaging   Significant decrease in peritoneal carcinomatosis since previous study. Interval resolution of ascites.   Focal area of parenchymal consolidation in central right middle lobe, which measures 3 cm. Differential diagnosis includes infectious or inflammatory process, atelectasis, and neoplasm. Recommend short-term follow-up by chest CT in 2-3 months.     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   Surgeon: Donaciano Eva    Operative Findings:  : grossly normal uterus, ovaries normal, few scattered peritoneal nodules (24m) on serosa of uterus and tubes. The omentum (gastrocolic) was tethered to the mesentery with tumor (thin rind). Complete (optimal) resection of tumor with no gross residual disease.     02/17/2019 Pathology Results   FINAL MICROSCOPIC DIAGNOSIS: A. UTERUS, CERVIX, BILATERAL FALLOPIAN TUBES AND OVARIES, HYSTERECTOMY WITH SALPINGOOOPHORECTOMY: - Uterus: Endometrium: Inactive endometrium. No hyperplasia or malignancy. Myometrium: Unremarkable. No malignancy. Serosa: Metastatic carcinoma. No malignancy. - Cervix: Benign squamous and endocervical mucosa. No  dysplasia or malignancy. - Left ovary and fallopian tube: Metastatic carcinoma. - Right ovary: No malignancy identified. - Right fallopian tube: Luminal tumor, see comment. B. OMENTUM, RESECTION: - High grade  serous carcinoma. - Deposits up to at least 3 cm. - See oncology table. ONCOLOGY TABLE: OVARY or FALLOPIAN TUBE or PRIMARY PERITONEUM: Procedure: Hysterectomy with bilateral salpingo-oophorectomy and omentectomy. Specimen Integrity: Intact Tumor Site: Peritoneum Ovarian Surface Involvement (required only if applicable): Left ovary Fallopian Tube Surface Involvement (required only if applicable): Left Fallopian tube Tumor Size: Largest deposit 3 cm Histologic Type: High-grade serous carcinoma Histologic Grade: High-grade Implants (required for advanced stage serous/seromucinous borderline tumors only): Uterine serosa, left fallopian tube and ovary, omentum Other Tissue/ Organ Involvement: As above Largest Extrapelvic Peritoneal Focus (required only if applicable): 3 cm Peritoneal/Ascitic Fluid: Pre neoadjuvant [id number] positive for carcinoma. Treatment Effect (required only for high-grade serous carcinomas): Probably partial treatment effect in omental tissue. Regional Lymph Nodes: No lymph nodes submitted or found Pathologic Stage Classification (pTNM, AJCC 8th Edition): ypT3c, ypNX Representative Tumor Block: B5 Comment(s): There is surface involvement of the left ovary and fallopian tube but no primary tumor. Within the right fallopian tube lumen there is detached fragments of tumor but again no precursor lesion is noted in the right fallopian tube. Thus, the tumor is presumed primary peritoneal and staged as such.   03/21/2019 Tumor Marker   Patient's tumor was tested for the following markers: CA-125 Results of the tumor marker test revealed 15.1    Genetic Testing   Negative genetic testing. No pathogenic variants identified on the [facility] CancerNext + RNAinsight panel. VUS in ATM called [id number] identified. The report date is 05/12/2019. TumorNext HRD was originally ordered but there was not enough sample to complete this testing.   The CancerNext+RNAinsight gene panel offered by  [facility] includes sequencing and rearrangement analysis for the following 36 genes: APC*, ATM*, AXIN2, BARD1, BMPR1A, BRCA1*, BRCA2*, BRIP1*, CDH1*, CDK4, CDKN2A, CHEK2*, DICER1, MLH1*, MSH2*, MSH3, MSH6*, MUTYH*, NBN, NF1*, NTHL1, PALB2*, PMS2*, PTEN*, RAD51C*, RAD51D*, RECQL, SMAD4, SMARCA4, STK11 and TP53* (sequencing and deletion/duplication); HOXB13, POLD1 and POLE (sequencing only); EPCAM and GREM1 (deletion/duplication only). DNA and RNA analyses performed for * genes.    05/16/2019 Tumor Marker   Patient's tumor was tested for the following markers: CA-125 Results of the tumor marker test revealed 10.7   06/16/2019 Imaging   1. No evidence of residual or recurrent metastatic disease in the chest, abdomen or pelvis. 2. Masslike focus of consolidation in the right middle lobe along the major and minor fissures with associated volume loss is stable since 01/31/2019. Indolent primary pulmonary neoplasm not excluded. PET-CT may be considered for further characterization. 3. Aortic Atherosclerosis (ICD10-I70.0).   06/16/2019 Tumor Marker   Patient's tumor was tested for the following markers: CA-125 Results of the tumor marker test revealed 9.1.   12/05/2019 Tumor Marker   Patient's tumor was tested for the following markers: CA-125. Results of the tumor marker test revealed 9.0.   02/29/2020 Tumor Marker   Patient's tumor was tested for the following markers: CA-`125 Results of the tumor marker test revealed 9.7.   05/28/2020 Tumor Marker   Patient's tumor was tested for the following markers: CA-125 Results of the tumor marker test revealed 9.5   08/16/2020 Tumor Marker   Patient's tumor was tested for the following markers: CA-125 Results of the tumor marker test revealed 10.9   11/29/2020 Tumor Marker   Patient's tumor was tested for the following markers: CA-125.  Results of the tumor marker test revealed 10.2.   01/22/2021 Pathology Results   SURGICAL PATHOLOGY  CASE:  [id number]  PATIENT: [name] [name]  Surgical Pathology Report   Clinical History: brain metastasis (cm)    FINAL MICROSCOPIC DIAGNOSIS:   A. 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    03/20/2021 Imaging   1. Unchanged enlarged midline superior mediastinal and left retroperitoneal lymph nodes, which remain suspicious for metastatic disease. 2. Unchanged masslike consolidation and volume loss of the right middle lobe measuring 3.8 x 1.9 cm, likely chronic scarring. Continued attention on follow-up. 3. No new evidence of metastatic disease in the chest, abdomen, or pelvis. 4. Status post hysterectomy and omentectomy.   Aortic Atherosclerosis (ICD10-I70.0).     03/21/2021 - 05/07/2021 Chemotherapy   Patient is on Treatment Plan : OVARIAN RECURRENT 3RD LINE Carboplatin D1 / Gemcitabine D1,8 (4/800) q21d     05/17/2021 Imaging   1. No new suspicious mass or lymphadenopathy identified in the chest, abdomen or pelvis. 2. Interval decreased size of previous enlarged superior mediastinal and left retroperitoneal lymph nodes. 3. Other ancillary findings as described.   05/21/2021 - 08/13/2021 Chemotherapy   Patient is on Treatment Plan : OVARIAN Gemcitabine D1,8 q21d      08/16/2021 Imaging   MRI brain  1. Regression of disease. Resolved marginal enhancement at the right middle frontal gyrus resection cavity and regressed enhancement at the treated left parietal lesion.   2. No new metastatic disease or acute intracranial abnormality.   08/19/2021 Imaging   Slight decrease in size of small left retroperitoneal and superior mediastinal lymph nodes.   No new or progressive disease within the chest, abdomen, or pelvis.   Aortic Atherosclerosis (ICD10-I70.0).     11/29/2021 Imaging   1. Slightly decreased size of the small left retroperitoneal lymph node  with stable superior mediastinal lymph node. 2. No new or progressive disease within the chest, abdomen or pelvis. 3.  Aortic Atherosclerosis (ICD10-I70.0).     03/27/2022 Imaging   1. No current findings of active malignancy. 2. Stable chronic atelectasis of the right middle lobe. 3. Mild aortoiliac atherosclerotic vascular calcification. 4. Degenerative subcortical cystic lesion along the left acetabulum. 5. Old healed left posterior sixth and seventh rib fractures. 6. Aortic atherosclerosis.     PHYSICAL EXAMINATION: ECOG PERFORMANCE STATUS: 1 - Symptomatic but completely ambulatory  Vitals:   03/27/22 0926  BP: (!) 129/53  Pulse: 78  Resp: 18  Temp: 97.6 F (36.4 C)  SpO2: 100%   Filed Weights   03/27/22 0926  Weight: 99 lb 9.6 oz (45.2 kg)    GENERAL:alert, no distress and comfortable NEURO: alert & oriented x 3 with fluent speech, no focal motor/sensory deficits  LABORATORY DATA:  I have reviewed the data as listed    Component Value Date/Time   NA 138 03/25/2022 0914   K 3.8 03/25/2022 0914   CL 101 03/25/2022 0914   CO2 28 03/25/2022 0914   GLUCOSE 83 03/25/2022 0914   BUN 12 03/25/2022 0914   CREATININE 0.54 03/25/2022 0914   CALCIUM 9.9 03/25/2022 0914   PROT 7.7 03/25/2022 0914   ALBUMIN 4.6 03/25/2022 0914   AST 24 03/25/2022 0914   ALT 17 03/25/2022 0914   ALKPHOS 66 03/25/2022 0914   BILITOT 0.6 03/25/2022 0914   GFRNONAA >60 03/25/2022 0914   GFRAA >60  12/05/2019 1145    No results found for: "SPEP", "UPEP"  Lab Results  Component Value Date   WBC 5.2 03/25/2022   NEUTROABS 3.0 03/25/2022   HGB 12.6 03/25/2022   HCT 36.5 03/25/2022   MCV 89.5 03/25/2022   PLT 223 03/25/2022      Chemistry      Component Value Date/Time   NA 138 03/25/2022 0914   K 3.8 03/25/2022 0914   CL 101 03/25/2022 0914   CO2 28 03/25/2022 0914   BUN 12 03/25/2022 0914   CREATININE 0.54 03/25/2022 0914      Component Value Date/Time   CALCIUM 9.9  03/25/2022 0914   ALKPHOS 66 03/25/2022 0914   AST 24 03/25/2022 0914   ALT 17 03/25/2022 0914   BILITOT 0.6 03/25/2022 0914       RADIOGRAPHIC STUDIES: I have reviewed multiple imaging with the patient I have personally reviewed the radiological images as listed and agreed with the findings in the report. CT CHEST ABDOMEN PELVIS W CONTRAST  Result Date: 03/25/2022 CLINICAL DATA:  Ovarian cancer restaging * Tracking Code: BO * EXAM: CT CHEST, ABDOMEN, AND PELVIS WITH CONTRAST TECHNIQUE: Multidetector CT imaging of the chest, abdomen and pelvis was performed following the standard protocol during bolus administration of intravenous contrast. RADIATION DOSE REDUCTION: This exam was performed according to the departmental dose-optimization program which includes automated exposure control, adjustment of the mA and/or kV according to patient size and/or use of iterative reconstruction technique. CONTRAST:  136m OMNIPAQUE IOHEXOL 300 MG/ML  SOLN COMPARISON:  11/28/2021 FINDINGS: CT CHEST FINDINGS Cardiovascular: Right Port-A-Cath tip: Cavoatrial junction. Mediastinum/Nodes: No pathologic adenopathy. Lungs/Pleura: Stable biapical pleuroparenchymal scarring. Stable atelectasis of the right middle lobe. Musculoskeletal: Old healed left posterior sixth and seventh rib fractures. Hemangioma in the T3 vertebral body. CT ABDOMEN PELVIS FINDINGS Hepatobiliary: Unremarkable Pancreas: Unremarkable Spleen: Unremarkable Adrenals/Urinary Tract: Unremarkable Stomach/Bowel: Unremarkable Vascular/Lymphatic: Mild aortoiliac atherosclerotic vascular calcification. Right gastric node 0.8 cm in short axis on image 61 series 2, formerly the same. No overtly pathologic adenopathy identified. Reproductive: Uterus absent.  Adnexa unremarkable. Other: Prior omentectomy. No well-defined nodularity along peritoneal surfaces. No substantial mesenteric nodularity. Musculoskeletal: Degenerative subcortical cystic lesion along the left  acetabulum, image 41 series 5. IMPRESSION: 1. No current findings of active malignancy. 2. Stable chronic atelectasis of the right middle lobe. 3. Mild aortoiliac atherosclerotic vascular calcification. 4. Degenerative subcortical cystic lesion along the left acetabulum. 5. Old healed left posterior sixth and seventh rib fractures. 6. Aortic atherosclerosis. Aortic Atherosclerosis (ICD10-I70.0). Electronically Signed   By: WVan ClinesM.D.   On: 03/25/2022 16:23

## 2022-03-27 NOTE — Assessment & Plan Note (Signed)
She has no focal neurological deficits She will continue monitoring and follow-up with neuro oncologist She is scheduled for MRI tomorrow

## 2022-03-27 NOTE — Assessment & Plan Note (Signed)
We spent majority of our time reviewing CT imaging and discussed findings The lymphadenopathy discussed on CT imaging and not consider enlarged or abnormal Overall, she has no signs of systemic cancer recurrence I plan to repeat imaging study again in 4 months We discussed future follow-up The patient would like to keep appointment with GYN surgeon for vaginal exam due to family history of vaginal melanoma

## 2022-03-28 ENCOUNTER — Ambulatory Visit
Admission: RE | Admit: 2022-03-28 | Discharge: 2022-03-28 | Disposition: A | Discharge status: Home / Self Care | Payer: Medicare Other | Source: Ambulatory Visit | Attending: Internal Medicine | Admitting: Internal Medicine

## 2022-03-28 DIAGNOSIS — C7931 Secondary malignant neoplasm of brain: Secondary | ICD-10-CM

## 2022-03-28 DIAGNOSIS — C729 Malignant neoplasm of central nervous system, unspecified: Secondary | ICD-10-CM | POA: Diagnosis not present

## 2022-03-28 MED ORDER — GADOPICLENOL 0.5 MMOL/ML IV SOLN
5.0000 mL | Freq: Once | INTRAVENOUS | Status: AC | PRN
  Administered 2022-03-28: 5 mL via INTRAVENOUS

## 2022-03-31 ENCOUNTER — Inpatient Hospital Stay: Payer: Medicare Other

## 2022-03-31 ENCOUNTER — Inpatient Hospital Stay (HOSPITAL_BASED_OUTPATIENT_CLINIC_OR_DEPARTMENT_OTHER): Payer: Medicare Other | Admitting: Internal Medicine

## 2022-03-31 VITALS — BP 135/69 | HR 84 | Temp 98.4°F | Resp 15 | Wt 98.8 lb

## 2022-03-31 DIAGNOSIS — C7931 Secondary malignant neoplasm of brain: Secondary | ICD-10-CM | POA: Diagnosis not present

## 2022-03-31 DIAGNOSIS — Z8544 Personal history of malignant neoplasm of other female genital organs: Secondary | ICD-10-CM | POA: Diagnosis not present

## 2022-03-31 MED ORDER — LORAZEPAM 2 MG PO TABS: 2.0000 mg | ORAL_TABLET | Freq: Four times a day (QID) | ORAL | 0 refills | Status: DC | PRN

## 2022-03-31 NOTE — Progress Notes (Signed)
[facility] [place], [place] [address] [phone]   Interval Evaluation  Date of Service: 03/31/22 Patient Name: [name] [name] Patient MRN: [id number] Patient DOB: 01-26-1951 Provider: Ventura Sellers, MD  Identifying Statement:  [name] [name] is a 72 y.o. 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 Findings highly suspicious for peritoneal carcinomatosis. Recommend paracentesis for therapeutic and diagnostic purposes. I do not see an obvious primary lesion but there is some irregular enhancing soft tissue in the right adnexal area. CA 125 level may be helpful. 2. Mild surface irregularity involving the liver but I do not see any obvious changes of cirrhosis. No hepatic lesions.   11/23/2018 Tumor Marker   Patient's tumor was tested for the following markers: CA-125 Results of the tumor marker test revealed 253.   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   12/01/2018 Cancer Staging   Staging form: Ovary, Fallopian Tube, and Primary Peritoneal Carcinoma, AJCC 8th Edition - Clinical stage from 12/01/2018: Stage IVA (rcT3, cN0, pM1a) - Signed by [name], MD on 02/14/2021 Stage prefix: Recurrence   12/02/2018 Tumor Marker   Patient's tumor was tested for the following markers: CA-125 Results of the tumor marker test revealed 267   12/06/2018 - 05/16/2019 Chemotherapy   The patient had carboplatin and taxol for chemotherapy treatment.     12/08/2018 Procedure   Successful ultrasound-guided therapeutic paracentesis  yielding 1.7 liters of peritoneal fluid.   12/24/2018 Procedure   Successful placement of a right IJ approach Power Port with ultrasound and fluoroscopic guidance. The catheter is ready for use   01/17/2019 Tumor Marker   Patient's tumor was tested for the following markers: CA-125 Results of the tumor marker test revealed 57.2   01/31/2019 Imaging   Significant decrease in peritoneal carcinomatosis since previous study. Interval resolution of ascites.   Focal area of parenchymal consolidation in central right middle lobe, which measures 3 cm. Differential diagnosis includes infectious or inflammatory process, atelectasis, and neoplasm. Recommend short-term follow-up by chest CT in 2-3 months.     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   Surgeon: Donaciano Eva    Operative Findings:  : grossly normal uterus, ovaries normal, few scattered peritoneal nodules (35m) on serosa of uterus and tubes. The omentum (gastrocolic) was tethered to the mesentery with tumor (thin rind). Complete (optimal) resection of tumor with no gross residual disease.     02/17/2019 Pathology Results   FINAL MICROSCOPIC DIAGNOSIS: A. UTERUS, CERVIX, BILATERAL FALLOPIAN TUBES AND OVARIES, HYSTERECTOMY WITH SALPINGOOOPHORECTOMY: - Uterus: Endometrium: Inactive endometrium. No hyperplasia or malignancy. Myometrium: Unremarkable. No malignancy. Serosa: Metastatic carcinoma. No malignancy. - Cervix: Benign squamous and endocervical mucosa. No dysplasia or malignancy. - Left ovary and fallopian tube: Metastatic carcinoma. - Right ovary: No malignancy identified. - Right fallopian tube: Luminal tumor, see comment. B. OMENTUM, RESECTION: - High grade serous  carcinoma. - Deposits  up to at least 3 cm. - See oncology table. ONCOLOGY TABLE: OVARY or FALLOPIAN TUBE or PRIMARY PERITONEUM: Procedure: Hysterectomy with bilateral salpingo-oophorectomy and omentectomy. Specimen Integrity: Intact Tumor Site: Peritoneum Ovarian Surface Involvement (required only if applicable): Left ovary Fallopian Tube Surface Involvement (required only if applicable): Left Fallopian tube Tumor Size: Largest deposit 3 cm Histologic Type: High-grade serous carcinoma Histologic Grade: High-grade Implants (required for advanced stage serous/seromucinous borderline tumors only): Uterine serosa, left fallopian tube and ovary, omentum Other Tissue/ Organ Involvement: As above Largest Extrapelvic Peritoneal Focus (required only if applicable): 3 cm Peritoneal/Ascitic Fluid: Pre neoadjuvant [id number] positive for carcinoma. Treatment Effect (required only for high-grade serous carcinomas): Probably partial treatment effect in omental tissue. Regional Lymph Nodes: No lymph nodes submitted or found Pathologic Stage Classification (pTNM, AJCC 8th Edition): ypT3c, ypNX Representative Tumor Block: B5 Comment(s): There is surface involvement of the left ovary and fallopian tube but no primary tumor. Within the right fallopian tube lumen there is detached fragments of tumor but again no precursor lesion is noted in the right fallopian tube. Thus, the tumor is presumed primary peritoneal and staged as such.   03/21/2019 Tumor Marker   Patient's tumor was tested for the following markers: CA-125 Results of the tumor marker test revealed 15.1    Genetic Testing   Negative genetic testing. No pathogenic variants identified on the [facility] CancerNext + RNAinsight panel. VUS in ATM called [id number] identified. The report date is 05/12/2019. TumorNext HRD was originally ordered but there was not enough sample to complete this testing.   The CancerNext+RNAinsight gene panel offered by [employer] [facility] includes sequencing and rearrangement analysis for the following 36 genes: APC*, ATM*, AXIN2, BARD1, BMPR1A, BRCA1*, BRCA2*, BRIP1*, CDH1*, CDK4, CDKN2A, CHEK2*, DICER1, MLH1*, MSH2*, MSH3, MSH6*, MUTYH*, NBN, NF1*, NTHL1, PALB2*, PMS2*, PTEN*, RAD51C*, RAD51D*, RECQL, SMAD4, SMARCA4, STK11 and TP53* (sequencing and deletion/duplication); HOXB13, POLD1 and POLE (sequencing only); EPCAM and GREM1 (deletion/duplication only). DNA and RNA analyses performed for * genes.    05/16/2019 Tumor Marker   Patient's tumor was tested for the following markers: CA-125 Results of the tumor marker test revealed 10.7   06/16/2019 Imaging   1. No evidence of residual or recurrent metastatic disease in the chest, abdomen or pelvis. 2. Masslike focus of consolidation in the right middle lobe along the major and minor fissures with associated volume loss is stable since 01/31/2019. Indolent primary pulmonary neoplasm not excluded. PET-CT may be considered for further characterization. 3. Aortic Atherosclerosis (ICD10-I70.0).   06/16/2019 Tumor Marker   Patient's tumor was tested for the following markers: CA-125 Results of the tumor marker test revealed 9.1.   12/05/2019 Tumor Marker   Patient's tumor was tested for the following markers: CA-125. Results of the tumor marker test revealed 9.0.   02/29/2020 Tumor Marker   Patient's tumor was tested for the following markers: CA-`125 Results of the tumor marker test revealed 9.7.   05/28/2020 Tumor Marker   Patient's tumor was tested for the following markers: CA-125 Results of the tumor marker test revealed 9.5   08/16/2020 Tumor Marker   Patient's tumor was tested for the following markers: CA-125 Results of the tumor marker test revealed 10.9   11/29/2020 Tumor Marker   Patient's tumor was tested for the following markers: CA-125. Results of the tumor marker test revealed 10.2.   01/22/2021 Pathology Results   SURGICAL PATHOLOGY  CASE: [id number]   PATIENT: Sampson Goon  Surgical Pathology Report   Clinical History: brain metastasis (cm)  FINAL MICROSCOPIC DIAGNOSIS:   A. 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    03/20/2021 Imaging   1. Unchanged enlarged midline superior mediastinal and left retroperitoneal lymph nodes, which remain suspicious for metastatic disease. 2. Unchanged masslike consolidation and volume loss of the right middle lobe measuring 3.8 x 1.9 cm, likely chronic scarring. Continued attention on follow-up. 3. No new evidence of metastatic disease in the chest, abdomen, or pelvis. 4. Status post hysterectomy and omentectomy.   Aortic Atherosclerosis (ICD10-I70.0).     03/21/2021 - 05/07/2021 Chemotherapy   Patient is on Treatment Plan : OVARIAN RECURRENT 3RD LINE Carboplatin D1 / Gemcitabine D1,8 (4/800) q21d     05/17/2021 Imaging   1. No new suspicious mass or lymphadenopathy identified in the chest, abdomen or pelvis. 2. Interval decreased size of previous enlarged superior mediastinal and left retroperitoneal lymph nodes. 3. Other ancillary findings as described.   05/21/2021 - 08/13/2021 Chemotherapy   Patient is on Treatment Plan : OVARIAN Gemcitabine D1,8 q21d      08/16/2021 Imaging   MRI brain  1. Regression of disease. Resolved marginal enhancement at the right middle frontal gyrus resection cavity and regressed enhancement at the treated left parietal lesion.   2. No new metastatic disease or acute intracranial abnormality.   08/19/2021 Imaging   Slight decrease in size of small left retroperitoneal and superior mediastinal lymph nodes.   No new or progressive disease within the chest, abdomen, or pelvis.   Aortic Atherosclerosis (ICD10-I70.0).     11/29/2021 Imaging   1. Slightly decreased size of the small left retroperitoneal lymph node with stable  superior mediastinal lymph node. 2. No new or progressive disease within the chest, abdomen or pelvis. 3.  Aortic Atherosclerosis (ICD10-I70.0).     03/27/2022 Imaging   1. No current findings of active malignancy. 2. Stable chronic atelectasis of the right middle lobe. 3. Mild aortoiliac atherosclerotic vascular calcification. 4. Degenerative subcortical cystic lesion along the left acetabulum. 5. Old healed left posterior sixth and seventh rib fractures. 6. Aortic atherosclerosis.    CNS Oncologic History 01/22/21: R frontal craniotomy, resection with Dr. Reatha Armour 02/21/21: Post-op SRS and SRS to L parietal target Tammi Klippel)  Interval History: REJOICE HEATWOLE presents for follow up.  Vertigo, dizziness have improved.  She has had several short focal seizures, with sensory changes in left face and arm.  Nothing out of the ordinary to provoke seizures aside from some increased stress.  Continues on Keppra '1000mg'$  twice per day.  Continues on observation with Dr. Alvy Bimler.  H+P (12/25/20) Patient presented to the ED on 12/22/20 with twitching of her left side of her face for 1 to 2 minutes, accompanied by slurring of speech.  There was no tongue biting, bowel or bladder incontinence.  Workup demonstrated two brain lesions consistent with suspected metastases.  She was started on Keppra (no decadron) and discharged two days later with no further seizure like events.  Priro to admission she had also been complaining of dizziness, confusion, impaired memory.  Has been seeing Dr. Alvy Bimler since 2021 for primary peritoneal carcinoma.   Medications: Current Outpatient Medications on File Prior to Visit  Medication Sig Dispense Refill   Azelaic Acid 15 % cream Apply 1 application topically daily.     cholecalciferol (VITAMIN D3) 25 MCG (1000 UNIT) tablet Take 2,000 Units by mouth daily.  gabapentin (NEURONTIN) 300 MG capsule Take 300 mg by mouth 3 (three) times daily.     levETIRAcetam (KEPPRA) 500  MG tablet Take 2 tablets (1,000 mg total) by mouth 2 (two) times daily. 120 tablet 3   lidocaine-prilocaine (EMLA) cream Apply to affected area(s) as needed. 30 g 3   Magnesium 250 MG TABS Take 250 mg by mouth daily as needed.     prochlorperazine (COMPAZINE) 10 MG tablet Take 1 tablet (10 mg total) by mouth every 6 (six) hours as needed (Nausea or vomiting). 30 tablet 1   Sulfacetamide Sodium, Acne, 10 % LOTN Apply 1 application topically at bedtime.     No current facility-administered medications on file prior to visit.    Allergies:  Allergies  Allergen Reactions   Carboplatin Itching and Rash    Patient has a hypersensitivity reaction to carboplatin.  See note from 04/30/2021 at 1439.  Reaction symptoms included itching of mouth and ears, burning in chest, hypotension, and rash to neck and arms.  Patient unable to complete infusion.   Fentanyl Palpitations    Palpitations , fever ,headahe, N/V  " loud pounding heart beat"    Vancomycin Rash   Milk (Cow) Diarrhea and Nausea And Vomiting   Brimonidine Tartrate     Other reaction(s): blurred vision   Brimonidine Tartrate     Other reaction(s): blurred vision   Ditropan [Oxybutynin]    Milk-Related Compounds Diarrhea and Nausea And Vomiting   Other Other (See Comments)   Oxybutynin Chloride Other (See Comments)    Ditropan  Pt doesn't remember    Thimerosal (Thiomersal) Other (See Comments)    Pt doesn't remember   Tramadol Other (See Comments)    Pt doesn't remember   Avelox [Moxifloxacin Hcl In Nacl] Rash   Doxycycline Rash   Erythromycin Rash   Metronidazole Rash   Minocycline Rash   Penicillins Rash    Did it involve swelling of the face/tongue/throat, SOB, or low BP? No Did it involve sudden or severe rash/hives, skin peeling, or any reaction on the inside of your mouth or nose? Yes, rash. Did you need to seek medical attention at a hospital or doctor's office? No When did it last happen?      Childhood allergy If all  above answers are "NO", may proceed with cephalosporin use.    Quinolones Rash   Sulfonamide Derivatives Rash   Past Medical History:  Past Medical History:  Diagnosis Date   Allergic rhinitis    Aortic insufficiency    Mild echo 10/2021   Cervical dysplasia 1980   Cervicalgia    Eczema    Rosacea,dermatitis-Dr Stinehelfer   Esophageal reflux    Family history of colon cancer    Family history of melanoma    Family history of ovarian cancer    Family history of pancreatic cancer    Family history of prostate cancer    Family history of uterine cancer    Fibromyalgia    GERD (gastroesophageal reflux disease)    Glaucoma, narrow-angle    Heart murmur    Hiatal hernia    Irritable bowel syndrome    LBBB (left bundle branch block)    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), unspecified site    DCIS   Mass of lung    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 Arlyce Dice   Mitral valve disorders(424.0)    No antibiotics required   Osteoporosis 12/2016   T score -2.3, 2014 T score -2.5 AP spine  PAC (premature atrial contraction) 2012   ,PVC's, and nonsustained atril tachycardia w aberration by heart monitor    PVC (premature ventricular contraction)    Rosacea    Stricture and stenosis of esophagus    Past Surgical History:  Past Surgical History:  Procedure Laterality Date   APPLICATION OF CRANIAL NAVIGATION N/A 01/22/2021   Procedure: APPLICATION OF CRANIAL NAVIGATION;  Surgeon: Karsten Ro, DO;  Location: [place];  Service: Neurosurgery;  Laterality: N/A;   BREAST LUMPECTOMY  1998   right breast with radiation   CERVICAL DISCECTOMY     C3-7 with bone graft, in three different surgeries, have titanium plate and screws   CRANIOTOMY N/A 01/22/2021   Procedure: STEREOTACTIC RIGHT FRONTAL CRANIOTOMY FOR RESECTION OF TUMOR;  Surgeon: Karsten Ro, DO;  Location: [place];  Service: Neurosurgery;  Laterality: N/A;   GYNECOLOGIC CRYOSURGERY  1980   IR IMAGING GUIDED PORT INSERTION   12/24/2018   NECK SURGERY  1993-1997   bone graft and fusion   OMENTECTOMY  02/17/2019   REFRACTIVE SURGERY     narrow angle glucoma   RHINOPLASTY  1976   sterotactic large core needle biopsy right breast Right 10/15/1996   TUBAL LIGATION  1977   Social History:  Social History   Socioeconomic History   Marital status: Married    Spouse name: Antony Haste   Number of children: 0   Years of education: Not on file   Highest education level: Not on file  Occupational History   Occupation: Teacher, early years/pre: [employer]  Tobacco Use   Smoking status: Never   Smokeless tobacco: Never  Vaping Use   Vaping Use: Never used  Substance and Sexual Activity   Alcohol use: Yes    Alcohol/week: 2.0 standard drinks of alcohol    Types: 2 Standard drinks or equivalent per week    Comment: Social    Drug use: No   Sexual activity: Not Currently    Birth control/protection: Post-menopausal    Comment: 1st intercourse 71 yo- More than 5 partners  Other Topics Concern   Not on file  Social History Narrative   Not on file   Social Determinants of Health   Financial Resource Strain: Not on file  Food Insecurity: Not on file  Transportation Needs: Not on file  Physical Activity: Not on file  Stress: Not on file  Social Connections: Not on file  Intimate Partner Violence: Not on file   Family History:  Family History  Problem Relation Age of Onset   Heart disease Mother    Depression Mother    Hypertension Mother    Heart disease Father    Hypertension Father    Aneurysm Father    Uterine cancer Sister 92   Hypertension Sister    Ovarian cancer Sister 14   Melanoma Sister 33   Depression Sister    Melanoma Sister 45   Heart disease Paternal Grandfather    Stroke Maternal Grandmother    Cancer Maternal Grandfather 63       Pancreatic   Stroke Paternal Grandmother    Cancer Paternal Uncle        multiple myeloma, prostate cancer and melanoma (separate primaries)    Colon cancer Neg Hx     Review of Systems: Constitutional: Doesn't report fevers, chills or abnormal weight loss Eyes: Doesn't report blurriness of vision Ears, nose, mouth, throat, and face: Doesn't report sore throat Respiratory: Doesn't report cough, dyspnea or wheezes  Cardiovascular: Doesn't report palpitation, chest discomfort  Gastrointestinal:  Doesn't report nausea, constipation, diarrhea GU: Doesn't report incontinence Skin: Doesn't report skin rashes Neurological: Per HPI Musculoskeletal: Doesn't report joint pain Behavioral/Psych: Doesn't report anxiety  Physical Exam: Wt Readings from Last 3 Encounters:  03/31/22 98 lb 12.8 oz (44.8 kg)  03/27/22 99 lb 9.6 oz (45.2 kg)  01/21/22 98 lb 8 oz (44.7 kg)   Temp Readings from Last 3 Encounters:  03/31/22 98.4 F (36.9 C) (Temporal)  03/27/22 97.6 F (36.4 C) (Tympanic)  01/21/22 98.4 F (36.9 C) (Oral)   BP Readings from Last 3 Encounters:  03/31/22 135/69  03/27/22 (!) 129/53  01/21/22 (!) 122/58   Pulse Readings from Last 3 Encounters:  03/31/22 84  03/27/22 78  01/21/22 76   KPS: 80. General: Alert, cooperative, pleasant, in no acute distress Head: Normal EENT: No conjunctival injection or scleral icterus.  Lungs: Resp effort normal Cardiac: Regular rate Abdomen: Non-distended abdomen Skin: No rashes cyanosis or petechiae. Extremities: No clubbing or edema  Neurologic Exam: Mental Status: Awake, alert, attentive to examiner. Oriented to self and environment. Language is fluent with intact comprehension.  Cranial Nerves: Visual acuity is grossly normal. Visual fields are full. Extra-ocular movements intact. No ptosis. Face is symmetric Motor: Tone and bulk are normal. Power is full in both arms and legs. Reflexes are symmetric, no pathologic reflexes present.  Sensory: Intact to light touch Gait: Normal.   Labs: I have reviewed the data as listed    Component Value Date/Time   NA 138 03/25/2022  0914   K 3.8 03/25/2022 0914   CL 101 03/25/2022 0914   CO2 28 03/25/2022 0914   GLUCOSE 83 03/25/2022 0914   BUN 12 03/25/2022 0914   CREATININE 0.54 03/25/2022 0914   CALCIUM 9.9 03/25/2022 0914   PROT 7.7 03/25/2022 0914   ALBUMIN 4.6 03/25/2022 0914   AST 24 03/25/2022 0914   ALT 17 03/25/2022 0914   ALKPHOS 66 03/25/2022 0914   BILITOT 0.6 03/25/2022 0914   GFRNONAA >60 03/25/2022 0914   GFRAA >60 12/05/2019 1145   Lab Results  Component Value Date   WBC 5.2 03/25/2022   NEUTROABS 3.0 03/25/2022   HGB 12.6 03/25/2022   HCT 36.5 03/25/2022   MCV 89.5 03/25/2022   PLT 223 03/25/2022    Imaging:  [facility] Clinician Interpretation: I have personally reviewed the CNS images as listed.  My interpretation, in the context of the patient's clinical presentation, is stable disease  MR BRAIN W WO CONTRAST  Result Date: 03/29/2022 CLINICAL DATA:  CNS neoplasm, assess treatment response. EXAM: MRI HEAD WITHOUT AND WITH CONTRAST TECHNIQUE: Multiplanar, multiecho pulse sequences of the brain and surrounding structures were obtained without and with intravenous contrast. CONTRAST:  11/27/2021 COMPARISON:  5 cc of vueway intravenous FINDINGS: Brain: Right posterior frontal resection site has a stable appearance with no abnormal enhancement. Faint area of enhancement in the left parietal cortex on 14:106 is also stable. Small nodular focus of enhancement near the foramen magnum on the left measuring 2 mm, likely benign enhancing lesion of the foramen magnum or tiny meningioma, stable since October 2022. No new lesion, infarct, acute hemorrhage, hydrocephalus, or collection. Vascular: Major flow voids and vascular enhancements are preserved Skull and upper cervical spine: Normal marrow signal separate from the patient's craniotomy site. Sinuses/Orbits: Negative. IMPRESSION: Stable treatment sites, no evidence of new or recurrent disease. Electronically Signed   By: Jorje Guild M.D.   On:  03/29/2022 11:06  CT CHEST ABDOMEN PELVIS W CONTRAST  Result Date: 03/25/2022 CLINICAL DATA:  Ovarian cancer restaging * Tracking Code: BO * EXAM: CT CHEST, ABDOMEN, AND PELVIS WITH CONTRAST TECHNIQUE: Multidetector CT imaging of the chest, abdomen and pelvis was performed following the standard protocol during bolus administration of intravenous contrast. RADIATION DOSE REDUCTION: This exam was performed according to the departmental dose-optimization program which includes automated exposure control, adjustment of the mA and/or kV according to patient size and/or use of iterative reconstruction technique. CONTRAST:  121m OMNIPAQUE IOHEXOL 300 MG/ML  SOLN COMPARISON:  11/28/2021 FINDINGS: CT CHEST FINDINGS Cardiovascular: Right Port-A-Cath tip: Cavoatrial junction. Mediastinum/Nodes: No pathologic adenopathy. Lungs/Pleura: Stable biapical pleuroparenchymal scarring. Stable atelectasis of the right middle lobe. Musculoskeletal: Old healed left posterior sixth and seventh rib fractures. Hemangioma in the T3 vertebral body. CT ABDOMEN PELVIS FINDINGS Hepatobiliary: Unremarkable Pancreas: Unremarkable Spleen: Unremarkable Adrenals/Urinary Tract: Unremarkable Stomach/Bowel: Unremarkable Vascular/Lymphatic: Mild aortoiliac atherosclerotic vascular calcification. Right gastric node 0.8 cm in short axis on image 61 series 2, formerly the same. No overtly pathologic adenopathy identified. Reproductive: Uterus absent.  Adnexa unremarkable. Other: Prior omentectomy. No well-defined nodularity along peritoneal surfaces. No substantial mesenteric nodularity. Musculoskeletal: Degenerative subcortical cystic lesion along the left acetabulum, image 41 series 5. IMPRESSION: 1. No current findings of active malignancy. 2. Stable chronic atelectasis of the right middle lobe. 3. Mild aortoiliac atherosclerotic vascular calcification. 4. Degenerative subcortical cystic lesion along the left acetabulum. 5. Old healed left posterior  sixth and seventh rib fractures. 6. Aortic atherosclerosis. Aortic Atherosclerosis (ICD10-I70.0). Electronically Signed   By: WVan ClinesM.D.   On: 03/25/2022 16:23     Assessment/Plan Secondary malignant neoplasm of brain ([facility]  JBRISTYL MCLEESis clinically and radiographically stable today.  For focal seizures, will con't Keppra '1000mg'$  BID.  Ativan '2mg'$  will be provided as emergency/rescue seizure medication.   Will con't to follow with Dr. GAlvy Bimleras well.  We appreciate the opportunity to participate in the care of JMARRIETTA THUNDER  We ask that JVIVAN VANDERVEERreturn to clinic in 4 months following next brain MRI, or sooner as needed.  All questions were answered. The patient knows to call the clinic with any problems, questions or concerns. No barriers to learning were detected.  The total time spent in the encounter was 30 minutes and more than 50% was on counseling and review of test results   ZVentura Sellers MD Medical Director of Neuro-Oncology [facility]Select [facility]at [place]01/15/24 9:52 AM

## 2022-04-03 ENCOUNTER — Other Ambulatory Visit: Payer: Self-pay | Admitting: Radiation Therapy

## 2022-04-07 ENCOUNTER — Ambulatory Visit: Payer: Medicare Other | Attending: Cardiology | Admitting: Cardiology

## 2022-04-07 ENCOUNTER — Encounter: Payer: Self-pay | Admitting: Cardiology

## 2022-04-07 VITALS — BP 125/66 | HR 83 | Ht 65.0 in | Wt 99.0 lb

## 2022-04-07 DIAGNOSIS — I351 Nonrheumatic aortic (valve) insufficiency: Secondary | ICD-10-CM | POA: Diagnosis not present

## 2022-04-07 DIAGNOSIS — I059 Rheumatic mitral valve disease, unspecified: Secondary | ICD-10-CM | POA: Diagnosis not present

## 2022-04-07 DIAGNOSIS — I447 Left bundle-branch block, unspecified: Secondary | ICD-10-CM | POA: Insufficient documentation

## 2022-04-07 DIAGNOSIS — I071 Rheumatic tricuspid insufficiency: Secondary | ICD-10-CM | POA: Insufficient documentation

## 2022-04-07 DIAGNOSIS — I371 Nonrheumatic pulmonary valve insufficiency: Secondary | ICD-10-CM | POA: Diagnosis not present

## 2022-04-07 DIAGNOSIS — I4719 Other supraventricular tachycardia: Secondary | ICD-10-CM | POA: Insufficient documentation

## 2022-04-07 DIAGNOSIS — I493 Ventricular premature depolarization: Secondary | ICD-10-CM | POA: Insufficient documentation

## 2022-04-07 NOTE — Progress Notes (Signed)
Cardiology Office Note:    Date:  04/07/2022   ID:  [name] [name], DOB Dec 24, 1950, MRN [id number]  PCP:  Orpah Melter, MD  Cardiologist:  Fransico Him, MD   Electrophysiologist:  None   Referring MD: Orpah Melter, MD   Chief Complaint  Patient presents with   Follow-up    Mitral valve prolapse, PACs, PVCs, PAT, aortic insufficiency, left bundle branch block     History of Present Illness:    [name] [name] is a 72 y.o. female with: Paroxysmal atrial tachycardia PACs/PVCs Mitral valve prolapse LBBB GERD EF 50 to 55% Mild aortic insufficiency  [name] [name] is a 72yo female with a hx of PAT, PAC/sPVCs, LBBB and MVP.  Remote echocardiogram for SOB demonstrated an EF of 45-50%.  Myoview demonstrated no ischemia and  EF was normal in 2016.  2D echo 10/2021 showed EF 50 to 55% with abnormal septal motion due to left bundle branch block and PVCs.  There is trivial MR and mild AI with AVSC.  She is here today for followup.  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  She had been chemo but had to stop due to toxicity.  Followup head MRI and Abd and pelvic CT 1.2024 with no reoccurence. She has some residual dizziness from her brain surgery.  She denies any chest pain or pressure, SOB, DOE, PND, orthopnea, LE edema or syncope. Occasionally she will have some PACs and PVCs.  She is compliant with her meds and is tolerating meds with no SE.    Prior CV studies:   The following studies were reviewed today:  Myoview 07/25/2014 Myocardial perfusion is abnormal. There is a moderate-sized fixed septal defect, secondary to LBBB. No reversible ischemia This is a low risk study. Overall left ventricular systolic function was normal. The left ventricular ejection fraction is normal (55-65%). There is no prior study for comparison.  Echocardiogram 07/25/2014 EF 45-50  Echocardiogram 11/06/2021 -EF 50 to 55%  with trivial MR and mild AR  Past Medical History:  Diagnosis Date   Allergic rhinitis    Aortic insufficiency    Mild echo 10/2021   Cervical dysplasia 1980   Cervicalgia    Eczema    Rosacea,dermatitis-Dr Stinehelfer   Esophageal reflux    Family history of colon cancer    Family history of melanoma    Family history of ovarian cancer    Family history of pancreatic cancer    Family history of prostate cancer    Family history of uterine cancer    Fibromyalgia    GERD (gastroesophageal reflux disease)    Glaucoma, narrow-angle    Heart murmur    Hiatal hernia    Irritable bowel syndrome    LBBB (left bundle branch block)    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), unspecified site    DCIS   Mass of lung    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 Arlyce Dice   Mitral valve disorders(424.0)    No antibiotics required   Osteoporosis 12/2016   T score -2.3, 2014 T score -2.5 AP spine   PAC (premature atrial contraction) 2012   ,PVC's, and nonsustained atril tachycardia w aberration by heart monitor    PVC (premature ventricular contraction)    Rosacea    Stricture and stenosis of esophagus    Surgical Hx: The patient  has a past surgical history that includes Neck surgery (6967-8938); Rhinoplasty (1976); Tubal ligation (  1977); Breast lumpectomy (1998); Refractive surgery; Cervical discectomy; Gynecologic cryosurgery (1980); IR IMAGING GUIDED PORT INSERTION (12/24/2018); Omentectomy (02/17/2019); sterotactic large core needle biopsy right breast (Right, 10/15/1996); Craniotomy (N/A, 20/04/5425); and Application of cranial navigation (N/A, 01/22/2021).   Current Medications: Current Meds  Medication Sig   Azelaic Acid 15 % cream Apply 1 application topically daily.   cholecalciferol (VITAMIN D3) 25 MCG (1000 UNIT) tablet Take 2,000 Units by mouth daily.   gabapentin (NEURONTIN) 300 MG capsule Take 300 mg by mouth 3 (three) times daily.   levETIRAcetam (KEPPRA) 500 MG tablet Take 2  tablets (1,000 mg total) by mouth 2 (two) times daily.   lidocaine-prilocaine (EMLA) cream Apply to affected area(s) as needed.   LORazepam (ATIVAN) 2 MG tablet Take 1 tablet (2 mg total) by mouth every 6 (six) hours as needed for seizure.   Magnesium 250 MG TABS Take 250 mg by mouth daily as needed.   Sulfacetamide Sodium, Acne, 10 % LOTN Apply 1 application topically at bedtime.     Allergies:   Carboplatin, Fentanyl, Vancomycin, Milk (cow), Brimonidine tartrate, Brimonidine tartrate, Ditropan [oxybutynin], Milk-related compounds, Other, Oxybutynin chloride, Thimerosal (thiomersal), Tramadol, Avelox [moxifloxacin hcl in nacl], Doxycycline, Erythromycin, Metronidazole, Minocycline, Penicillins, Quinolones, and Sulfonamide derivatives   Social History   Tobacco Use   Smoking status: Never   Smokeless tobacco: Never  Vaping Use   Vaping Use: Never used  Substance Use Topics   Alcohol use: Yes    Alcohol/week: 2.0 standard drinks of alcohol    Types: 2 Standard drinks or equivalent per week    Comment: Social    Drug use: No     Family Hx: The patient's family history includes Aneurysm in her father; Cancer in her paternal uncle; Cancer (age of onset: 52) in her maternal grandfather; Depression in her mother and sister; Heart disease in her father, mother, and paternal grandfather; Hypertension in her father, mother, and sister; Melanoma (age of onset: 79) in her sister; Melanoma (age of onset: 6) in her sister; Ovarian cancer (age of onset: 50) in her sister; Stroke in her maternal grandmother and paternal grandmother; Uterine cancer (age of onset: 50) in her sister. There is no history of Colon cancer.  ROS:   Please see the history of present illness.    ROS All other systems reviewed and are negative.   EKGs/Labs/Other Test Reviewed:      Recent Labs: 03/25/2022: ALT 17; BUN 12; Creatinine 0.54; Hemoglobin 12.6; Platelet Count 223; Potassium 3.8; Sodium 138   Recent Lipid  Panel No results found for: "CHOL", "TRIG", "HDL", "CHOLHDL", "LDLCALC", "LDLDIRECT"  Physical Exam:    VS:  BP 125/66   Pulse 83   Ht '5\' 5"'$  (1.651 m)   Wt 99 lb (44.9 kg)   SpO2 98%   BMI 16.47 kg/m     Wt Readings from Last 3 Encounters:  04/07/22 99 lb (44.9 kg)  03/31/22 98 lb 12.8 oz (44.8 kg)  03/27/22 99 lb 9.6 oz (45.2 kg)    GEN: Well nourished, well developed in no acute distress HEENT: Normal NECK: No JVD; No carotid bruits LYMPHATICS: No lymphadenopathy CARDIAC:RRR, no murmurs, rubs, gallops RESPIRATORY:  Clear to auscultation without rales, wheezing or rhonchi  ABDOMEN: Soft, non-tender, non-distended MUSCULOSKELETAL:  No edema; No deformity  SKIN: Warm and dry NEUROLOGIC:  Alert and oriented x 3 PSYCHIATRIC:  Normal affect   12 lead EKG showed NSR with anterior infarct, RAE, LPFB, ST/T wave abnormality c/w inferior infarct ASSESSMENT &  PLAN:     Mitral valve disorder -2D echo 2023 showed no MVP and trivial MR -She does not require antibiotic prophylaxis.  PVC (premature ventricular contraction)/PAT She has occasional palpitations with PACs and PVCs  LBBB (left bundle branch block) -this is chronic -no ischemia on Leixscan myoview in 2016 -She remains asymptomatic -EF on echo 10/2021 showed EF 50 to 55% in the setting of abnormal septal wall motion from left bundle branch block -EKG did not show LBBB and actually showed LPFB and T wave inversions in the inferior leads that is new -repeat echo to make sure no changes in LVF  Aortic/Pulmonic and Tricuspid insufficiency -Mild by echo 10/2021  Dispo:  Followup with me in 1 year  Medication Adjustments/Labs and Tests Ordered: Current medicines are reviewed at length with the patient today.  Concerns regarding medicines are outlined above.  Tests Ordered: Orders Placed This Encounter  Procedures   EKG 12-Lead   Medication Changes: No orders of the defined types were placed in this  encounter.   Signed, Fransico Him, MD  04/07/2022 10:32 AM    [facility] [address], [place], [place]  [address] Phone: [phone]; Fax: [phone]

## 2022-04-07 NOTE — Addendum Note (Signed)
Addended by: Joni Reining on: 04/07/2022 10:54 AM   Modules accepted: Orders

## 2022-04-07 NOTE — Patient Instructions (Signed)
Medication Instructions:  Your physician recommends that you continue on your current medications as directed. Please refer to the Current Medication list given to you today.  *If you need a refill on your cardiac medications before your next appointment, please call your pharmacy*   Lab Work: None. If you have labs (blood work) drawn today and your tests are completely normal, you will receive your results only by: [place] (if you have MyChart) OR A paper copy in the mail If you have any lab test that is abnormal or we need to change your treatment, we will call you to review the results.   Testing/Procedures: Your physician has requested that you have an echocardiogram. Echocardiography is a painless test that uses sound waves to create images of your heart. It provides your doctor with information about the size and shape of your heart and how well your heart's chambers and valves are working. This procedure takes approximately one hour. There are no restrictions for this procedure. Please do NOT wear cologne, perfume, aftershave, or lotions (deodorant is allowed). Please arrive 15 minutes prior to your appointment time.    Follow-Up: At [facility], you and your health needs are our priority.  As part of our continuing mission to provide you with exceptional heart care, we have created designated Provider Care Teams.  These Care Teams include your primary Cardiologist (physician) and Advanced Practice Providers (APPs -  Physician Assistants and Nurse Practitioners) who all work together to provide you with the care you need, when you need it.  We recommend signing up for the patient portal called "MyChart".  Sign up information is provided on this After Visit Summary.  MyChart is used to connect with patients for Virtual Visits (Telemedicine).  Patients are able to view lab/test results, encounter notes, upcoming appointments, etc.  Non-urgent messages can be sent to  your provider as well.   To learn more about what you can do with MyChart, go to [email or web address].    Your next appointment:   1 year(s)  Provider:   Fransico Him, MD

## 2022-04-09 ENCOUNTER — Encounter: Payer: Self-pay | Admitting: Hematology and Oncology

## 2022-04-09 DIAGNOSIS — C7931 Secondary malignant neoplasm of brain: Secondary | ICD-10-CM | POA: Diagnosis not present

## 2022-04-09 DIAGNOSIS — Z681 Body mass index (BMI) 19 or less, adult: Secondary | ICD-10-CM | POA: Diagnosis not present

## 2022-04-10 ENCOUNTER — Encounter: Payer: Self-pay | Admitting: Cardiology

## 2022-04-10 ENCOUNTER — Telehealth: Payer: Self-pay | Admitting: Cardiology

## 2022-04-10 DIAGNOSIS — Z13220 Encounter for screening for lipoid disorders: Secondary | ICD-10-CM

## 2022-04-10 NOTE — Telephone Encounter (Signed)
Called pt reports last lipid panel was in 2015.  Advised pt will need to get FLP.  Pt will come in on 04/15/22.

## 2022-04-10 NOTE — Telephone Encounter (Signed)
Called pt in regards to additional questions from 04/07/22 OV with Dr.Turner.  Pt reports does not have questions. Wants to make Dr. Radford Pax aware that has had serial CT Abd Pelvis that show mild Aortic Atherosclerosis.  Wants to know if this will change things.   Advised will send information to MD to review.

## 2022-04-10 NOTE — Telephone Encounter (Signed)
Pt would like to talk to RN about some additional questions she has following her appt 04/07/22.

## 2022-04-11 ENCOUNTER — Ambulatory Visit: Payer: Medicare Other | Attending: Cardiology

## 2022-04-11 DIAGNOSIS — Z13220 Encounter for screening for lipoid disorders: Secondary | ICD-10-CM

## 2022-04-11 LAB — LIPID PANEL
Chol/HDL Ratio: 2.3 ratio (ref 0.0–4.4)
Cholesterol, Total: 223 mg/dL — ABNORMAL HIGH (ref 100–199)
HDL: 97 mg/dL (ref >39)
LDL Chol Calc (NIH): 117 mg/dL — ABNORMAL HIGH (ref 0–99)
Triglycerides: 52 mg/dL (ref 0–149)
VLDL Cholesterol Cal: 9 mg/dL (ref 5–40)

## 2022-04-15 ENCOUNTER — Telehealth: Payer: Self-pay

## 2022-04-15 ENCOUNTER — Telehealth: Payer: Self-pay | Admitting: *Deleted

## 2022-04-15 ENCOUNTER — Other Ambulatory Visit: Payer: Medicare Other

## 2022-04-15 DIAGNOSIS — I700 Atherosclerosis of aorta: Secondary | ICD-10-CM

## 2022-04-15 NOTE — Telephone Encounter (Signed)
The patient has been notified of the result and verbalized understanding.  All questions (if any) were answered. Bernestine Amass, RN 04/15/2022 9:46 AM   Calcium score has been ordered.

## 2022-04-15 NOTE — Telephone Encounter (Signed)
-----  Message from Gershon Crane, LPN sent at 2/84/1324  9:30 AM EST -----  ----- Message ----- From: [name] Margarita, MD Sent: 04/12/2022   3:10 PM EST To: Cv Div [facility] St Triage  LDL is elevated.  Please have her come in for a coronary calcium score and if she has coronary calcium we will need to start her on statin therapy

## 2022-04-15 NOTE — Telephone Encounter (Signed)
Pt is returning with more information she states and requesting to speak to [place], [place] again.

## 2022-04-15 NOTE — Telephone Encounter (Signed)
Clarifying NOTE for office visit 03/31/2022 patient reports   Interval History: [name] [name] presents for follow up.  Vertigo, dizziness have improved.  She has had several short focal seizures, with sensory changes in left face and arm.  Nothing out of the ordinary to provoke seizures aside from some increased stress.  Continues on Keppra '1000mg'$  twice per day.  Continues on observation with Dr. Alvy Bimler.  CORRECTION: She has had several short focal seizures, with sensory change in left face and left fingers and thumb.  No involvement of arm.

## 2022-04-15 NOTE — Telephone Encounter (Signed)
Patient would like to know if chest CT that she had recently would show anything that we are looking for with a calcium score CT scan. Advised that it did not give [name] the information about her heart that we are looking for.

## 2022-04-17 ENCOUNTER — Encounter: Payer: Self-pay | Admitting: Hematology and Oncology

## 2022-04-17 DIAGNOSIS — M950 Acquired deformity of nose: Secondary | ICD-10-CM | POA: Diagnosis not present

## 2022-04-17 DIAGNOSIS — J3489 Other specified disorders of nose and nasal sinuses: Secondary | ICD-10-CM | POA: Diagnosis not present

## 2022-04-17 DIAGNOSIS — H6992 Unspecified Eustachian tube disorder, left ear: Secondary | ICD-10-CM | POA: Diagnosis not present

## 2022-04-28 ENCOUNTER — Telehealth: Payer: Self-pay

## 2022-04-28 NOTE — Telephone Encounter (Signed)
Returned her call and given radiology scheduling phone # to call to schedule May CT. She verbalized understanding and appreciated the call.

## 2022-04-30 ENCOUNTER — Ambulatory Visit (HOSPITAL_COMMUNITY): Payer: Medicare Other | Attending: Cardiology

## 2022-04-30 ENCOUNTER — Telehealth: Payer: Self-pay | Admitting: Cardiology

## 2022-04-30 DIAGNOSIS — I447 Left bundle-branch block, unspecified: Secondary | ICD-10-CM | POA: Insufficient documentation

## 2022-04-30 DIAGNOSIS — I4719 Other supraventricular tachycardia: Secondary | ICD-10-CM | POA: Diagnosis not present

## 2022-04-30 DIAGNOSIS — I493 Ventricular premature depolarization: Secondary | ICD-10-CM | POA: Diagnosis not present

## 2022-04-30 LAB — ECHOCARDIOGRAM COMPLETE
Area-P 1/2: 3.03 cm2
S' Lateral: 2.5 cm

## 2022-04-30 NOTE — Telephone Encounter (Signed)
Called patient to let her know that results of Echo done today 04/30/22 are not available yet. Staff will call her to review results once the report is available. Patient verbalizes understanding.

## 2022-04-30 NOTE — Telephone Encounter (Signed)
Patient would like written report only, does not need the scans or pictures.

## 2022-04-30 NOTE — Telephone Encounter (Signed)
Patient would like her ECHO results from today 04/30/22 to be mailed to her just [name] [name].

## 2022-05-01 ENCOUNTER — Encounter: Payer: Self-pay | Admitting: Cardiology

## 2022-05-06 ENCOUNTER — Telehealth: Payer: Self-pay

## 2022-05-06 DIAGNOSIS — I341 Nonrheumatic mitral (valve) prolapse: Secondary | ICD-10-CM

## 2022-05-06 NOTE — Telephone Encounter (Signed)
Reviewed results with patient who verbalizes understanding of normal LV function, with minor leakiness of mitral and aortic valves. Patient agrees to repeat Echo in 1 year, orders placed.

## 2022-05-06 NOTE — Telephone Encounter (Signed)
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  There was trivial leakiness of the mitral valve.  Mildly leaky aortic valve.  Repeat echo in 1 year for AI

## 2022-05-07 ENCOUNTER — Other Ambulatory Visit: Payer: Self-pay | Admitting: Internal Medicine

## 2022-05-21 ENCOUNTER — Ambulatory Visit
Admission: RE | Admit: 2022-05-21 | Discharge: 2022-05-21 | Disposition: A | Discharge status: Home / Self Care | Payer: Medicare Other | Source: Ambulatory Visit | Attending: Cardiology | Admitting: Cardiology

## 2022-05-21 DIAGNOSIS — I700 Atherosclerosis of aorta: Secondary | ICD-10-CM

## 2022-05-22 ENCOUNTER — Inpatient Hospital Stay: Payer: Medicare Other | Attending: Gynecologic Oncology

## 2022-05-22 ENCOUNTER — Other Ambulatory Visit: Payer: Self-pay

## 2022-05-22 DIAGNOSIS — Z452 Encounter for adjustment and management of vascular access device: Secondary | ICD-10-CM | POA: Insufficient documentation

## 2022-05-22 DIAGNOSIS — Z8544 Personal history of malignant neoplasm of other female genital organs: Secondary | ICD-10-CM | POA: Insufficient documentation

## 2022-05-22 DIAGNOSIS — C482 Malignant neoplasm of peritoneum, unspecified: Secondary | ICD-10-CM

## 2022-05-22 MED ORDER — SODIUM CHLORIDE 0.9% FLUSH
10.0000 mL | Freq: Once | INTRAVENOUS | Status: AC
  Administered 2022-05-22: 10 mL

## 2022-05-22 MED ORDER — HEPARIN SOD (PORK) LOCK FLUSH 100 UNIT/ML IV SOLN
500.0000 [IU] | Freq: Once | INTRAVENOUS | Status: AC
  Administered 2022-05-22: 500 [IU]

## 2022-05-23 ENCOUNTER — Telehealth: Payer: Self-pay

## 2022-05-23 NOTE — Telephone Encounter (Signed)
Left detailed message on patient's voice mail per DPR that her calcium score is 0, which is a very good result and indicates she has very low risk.

## 2022-05-23 NOTE — Telephone Encounter (Signed)
-----   Message from Precious Gilding, RN sent at 05/23/2022  1:49 PM EST -----  ----- Message ----- From: Freada Bergeron, MD Sent: 05/22/2022   9:09 PM EST To: Cv Div Ch St Triage  Her Ca score is 0. This is great news and means she is low CV risk.

## 2022-05-26 NOTE — Telephone Encounter (Signed)
Pt would prefer to speak with Danae Chen when she is back in the office.  Will forward to [place].

## 2022-05-26 NOTE — Telephone Encounter (Signed)
Patient is returning call in regards to results. She states she has further questions.

## 2022-05-29 DIAGNOSIS — M542 Cervicalgia: Secondary | ICD-10-CM | POA: Diagnosis not present

## 2022-05-29 DIAGNOSIS — H6992 Unspecified Eustachian tube disorder, left ear: Secondary | ICD-10-CM | POA: Diagnosis not present

## 2022-05-29 NOTE — Telephone Encounter (Signed)
Reviewed calcium score results with patient who verbalizes understanding of 0 calcium score and low CV risk.

## 2022-06-19 ENCOUNTER — Telehealth: Payer: Self-pay

## 2022-06-19 NOTE — Telephone Encounter (Signed)
Returned her call. Moved her port lab flush appt to 0730 on 5/2 prior to CT. She is aware of appt times on 5/2.

## 2022-06-20 ENCOUNTER — Ambulatory Visit: Payer: Medicare Other | Admitting: Gynecologic Oncology

## 2022-07-17 ENCOUNTER — Ambulatory Visit (HOSPITAL_COMMUNITY)
Admission: RE | Admit: 2022-07-17 | Discharge: 2022-07-17 | Disposition: A | Discharge status: Home / Self Care | Payer: Medicare Other | Source: Ambulatory Visit | Attending: Hematology and Oncology | Admitting: Hematology and Oncology

## 2022-07-17 ENCOUNTER — Other Ambulatory Visit: Payer: Self-pay

## 2022-07-17 ENCOUNTER — Inpatient Hospital Stay: Payer: Medicare Other | Attending: Gynecologic Oncology

## 2022-07-17 ENCOUNTER — Other Ambulatory Visit: Payer: BLUE CROSS/BLUE SHIELD

## 2022-07-17 DIAGNOSIS — R569 Unspecified convulsions: Secondary | ICD-10-CM | POA: Diagnosis not present

## 2022-07-17 DIAGNOSIS — G620 Drug-induced polyneuropathy: Secondary | ICD-10-CM | POA: Insufficient documentation

## 2022-07-17 DIAGNOSIS — C7931 Secondary malignant neoplasm of brain: Secondary | ICD-10-CM | POA: Diagnosis not present

## 2022-07-17 DIAGNOSIS — C482 Malignant neoplasm of peritoneum, unspecified: Secondary | ICD-10-CM | POA: Diagnosis present

## 2022-07-17 DIAGNOSIS — C569 Malignant neoplasm of unspecified ovary: Secondary | ICD-10-CM | POA: Diagnosis not present

## 2022-07-17 DIAGNOSIS — Z452 Encounter for adjustment and management of vascular access device: Secondary | ICD-10-CM | POA: Diagnosis not present

## 2022-07-17 LAB — CBC WITH DIFFERENTIAL (CANCER CENTER ONLY)
Abs Immature Granulocytes: 0.07 10*3/uL (ref 0.00–0.07)
Basophils Absolute: 0 10*3/uL (ref 0.0–0.1)
Basophils Relative: 1 %
Eosinophils Absolute: 0 10*3/uL (ref 0.0–0.5)
Eosinophils Relative: 1 %
HCT: 35.1 % — ABNORMAL LOW (ref 36.0–46.0)
Hemoglobin: 11.9 g/dL — ABNORMAL LOW (ref 12.0–15.0)
Immature Granulocytes: 1 %
Lymphocytes Relative: 27 %
Lymphs Abs: 1.6 10*3/uL (ref 0.7–4.0)
MCH: 30.5 pg (ref 26.0–34.0)
MCHC: 33.9 g/dL (ref 30.0–36.0)
MCV: 90 fL (ref 80.0–100.0)
Monocytes Absolute: 0.4 10*3/uL (ref 0.1–1.0)
Monocytes Relative: 7 %
Neutro Abs: 3.8 10*3/uL (ref 1.7–7.7)
Neutrophils Relative %: 63 %
Platelet Count: 216 10*3/uL (ref 150–400)
RBC: 3.9 MIL/uL (ref 3.87–5.11)
RDW: 12.8 % (ref 11.5–15.5)
WBC Count: 6 10*3/uL (ref 4.0–10.5)
nRBC: 0 % (ref 0.0–0.2)

## 2022-07-17 LAB — CMP (CANCER CENTER ONLY)
ALT: 15 U/L (ref 0–44)
AST: 22 U/L (ref 15–41)
Albumin: 4.5 g/dL (ref 3.5–5.0)
Alkaline Phosphatase: 67 U/L (ref 38–126)
Anion gap: 7 (ref 5–15)
BUN: 10 mg/dL (ref 8–23)
CO2: 29 mmol/L (ref 22–32)
Calcium: 9.3 mg/dL (ref 8.9–10.3)
Chloride: 101 mmol/L (ref 98–111)
Creatinine: 0.53 mg/dL (ref 0.44–1.00)
GFR, Estimated: >60 mL/min (ref >60)
Glucose, Bld: 92 mg/dL (ref 70–99)
Potassium: 3.8 mmol/L (ref 3.5–5.1)
Sodium: 137 mmol/L (ref 135–145)
Total Bilirubin: 0.5 mg/dL (ref 0.3–1.2)
Total Protein: 7.3 g/dL (ref 6.5–8.1)

## 2022-07-17 MED ORDER — IOHEXOL 300 MG/ML  SOLN
80.0000 mL | Freq: Once | INTRAMUSCULAR | Status: AC | PRN
  Administered 2022-07-17: 80 mL via INTRAVENOUS

## 2022-07-17 MED ORDER — IOHEXOL 9 MG/ML PO SOLN
ORAL | Status: AC
  Filled 2022-07-17: qty 1000

## 2022-07-17 MED ORDER — SODIUM CHLORIDE 0.9% FLUSH
10.0000 mL | Freq: Once | INTRAVENOUS | Status: AC
  Administered 2022-07-17: 10 mL

## 2022-07-17 MED ORDER — IOHEXOL 9 MG/ML PO SOLN
500.0000 mL | ORAL | Status: AC
  Administered 2022-07-17: 1000 mL via ORAL
  Administered 2022-07-17: 500 mL via ORAL

## 2022-07-17 MED ORDER — HEPARIN SOD (PORK) LOCK FLUSH 100 UNIT/ML IV SOLN
500.0000 [IU] | Freq: Once | INTRAVENOUS | Status: AC
  Administered 2022-07-17: 500 [IU] via INTRAVENOUS

## 2022-07-17 MED ORDER — HEPARIN SOD (PORK) LOCK FLUSH 100 UNIT/ML IV SOLN
INTRAVENOUS | Status: AC
  Filled 2022-07-17: qty 5

## 2022-07-21 ENCOUNTER — Telehealth: Payer: Self-pay

## 2022-07-21 NOTE — Telephone Encounter (Signed)
Returned Pt's call regarding CT results. Pt states she has an MD appt tomorrow but CT results are not resulted. Called radiology reading room who stated they will have scan read today. Relayed message to Pt who verbalized understanding.

## 2022-07-22 ENCOUNTER — Other Ambulatory Visit: Payer: Self-pay

## 2022-07-22 ENCOUNTER — Inpatient Hospital Stay (HOSPITAL_BASED_OUTPATIENT_CLINIC_OR_DEPARTMENT_OTHER): Payer: Medicare Other | Admitting: Hematology and Oncology

## 2022-07-22 ENCOUNTER — Encounter: Payer: Self-pay | Admitting: Hematology and Oncology

## 2022-07-22 VITALS — BP 139/58 | HR 77 | Temp 97.7°F | Resp 18 | Ht 65.0 in | Wt 97.0 lb

## 2022-07-22 DIAGNOSIS — R5383 Other fatigue: Secondary | ICD-10-CM | POA: Diagnosis not present

## 2022-07-22 DIAGNOSIS — C482 Malignant neoplasm of peritoneum, unspecified: Secondary | ICD-10-CM | POA: Diagnosis not present

## 2022-07-22 DIAGNOSIS — F064 Anxiety disorder due to known physiological condition: Secondary | ICD-10-CM | POA: Diagnosis not present

## 2022-07-22 NOTE — Assessment & Plan Note (Signed)
She appears incredibly anxious today due to the report indicated new disease Despite my best effort and reviewing multiple imaging studies with the patient, she still appears somewhat anxious that she might have cancer recurrence I tried to reassure her to the best of my ability

## 2022-07-22 NOTE — Assessment & Plan Note (Signed)
I have reviewed multiple imaging studies with the patient, including in comparison with her scan from January 2024 and September 2023 The nodules that was noted on her recent report was not new, they were present even back in September but just appears somewhat more prominent with the recent imaging studies I tried to reassure the patient that the abnormality seen does not indicate active disease There is no role to start her on any form of treatment and since they are present since last year, there is no urgency to repeat imaging study sooner than our planned 4 months interval Plan to see her again in a few months for further follow-up

## 2022-07-22 NOTE — Progress Notes (Signed)
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[facility]) I have reviewed multiple imaging studies with the patient, including in comparison with her scan from January 2024 and September 2023 The nodules that was noted on her recent report was not new, they were present even back in September but just appears somewhat more prominent with the recent imaging studies I tried to reassure the patient that the abnormality seen does not indicate active disease There is no role to start her on any form of treatment and since they are present since last year, there is no urgency to repeat imaging study sooner than our planned 4 months interval Plan to see her again in a few months for further follow-up  Other fatigue She complained of excessive fatigue Her hemoglobin is just borderline low at 11.9 and I do not believe this is the cause of her excessive fatigue  Anxiety disorder due to general medical condition She appears incredibly anxious today due to the report indicated new disease Despite my best effort and reviewing multiple imaging studies with the patient, she still appears somewhat anxious that she might have cancer recurrence I tried to reassure her to the best of my ability  Orders Placed This Encounter  Procedures   CT CHEST ABDOMEN PELVIS W CONTRAST    Standing Status:   Future    Standing Expiration Date:   07/22/2023    Order Specific Question:   If indicated for the ordered procedure, I authorize the administration of contrast media per Radiology protocol    Answer:   Yes    Order Specific Question:   Does the patient have a contrast media/X-ray dye allergy?    Answer:   Yes    Order Specific Question:   Preferred imaging location?    Answer:   [facility]    Order Specific Question:   If  indicated for the ordered procedure, I authorize the administration of oral contrast media per Radiology protocol    Answer:   Yes   CBC with Differential/Platelet    Standing Status:   Standing    Number of Occurrences:   22    Standing Expiration Date:   07/22/2023   Comprehensive metabolic panel    Standing Status:   Standing    Number of Occurrences:   33    Standing Expiration Date:   07/22/2023   CA 125    Standing Status:   Standing    Number of Occurrences:   11    Standing Expiration Date:   07/22/2023    All questions were answered. The patient knows to call the clinic with any problems, questions or concerns. The total time spent in the appointment was 30 minutes encounter with patients including review of chart and various tests results, discussions about plan of care and coordination of care plan   Artis Delay, MD 07/22/2022 2:00 PM  INTERVAL HISTORY: Please see below for problem oriented charting. she returns to review test results She appears very anxious due to clinical impression from recent imaging studies She complained of some mild fatigue and is wondering if the hemoglobin being abnormal is the cause of her fatigue I spent a lot of time reviewing multiple imaging studies with her today  I have reviewed the past medical history, past surgical history, social history  and family history with the patient and they are unchanged from previous note.  ALLERGIES:  is allergic to carboplatin, fentanyl, vancomycin, milk (cow), brimonidine tartrate, brimonidine tartrate, ditropan [oxybutynin], milk-related compounds, other, oxybutynin chloride, thimerosal (thiomersal), tramadol, avelox [moxifloxacin hcl in nacl], doxycycline, erythromycin, metronidazole, minocycline, penicillins, quinolones, and sulfonamide derivatives.  MEDICATIONS:  Current Outpatient Medications  Medication Sig Dispense Refill   Azelaic Acid 15 % cream Apply 1 application topically daily.     cholecalciferol  (VITAMIN D3) 25 MCG (1000 UNIT) tablet Take 2,000 Units by mouth daily.     gabapentin (NEURONTIN) 300 MG capsule Take 300 mg by mouth 3 (three) times daily.     levETIRAcetam (KEPPRA) 500 MG tablet TAKE 2 TABLETS(1000 MG) BY MOUTH TWICE DAILY 120 tablet 3   lidocaine-prilocaine (EMLA) cream Apply to affected area(s) as needed. 30 g 3   LORazepam (ATIVAN) 2 MG tablet Take 1 tablet (2 mg total) by mouth every 6 (six) hours as needed for seizure. 10 tablet 0   Magnesium 250 MG TABS Take 250 mg by mouth daily as needed.     prochlorperazine (COMPAZINE) 10 MG tablet Take 1 tablet (10 mg total) by mouth every 6 (six) hours as needed (Nausea or vomiting). (Patient not taking: Reported on 04/07/2022) 30 tablet 1   Sulfacetamide Sodium, Acne, 10 % LOTN Apply 1 application topically at bedtime.     No current facility-administered medications for this visit.    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 Findings highly suspicious for peritoneal carcinomatosis. Recommend paracentesis for therapeutic and diagnostic purposes. I do not see an obvious primary lesion but there is some irregular enhancing soft tissue in the right adnexal area. CA 125 level may be helpful. 2. Mild surface irregularity involving the liver but I do not see any obvious changes of cirrhosis. No hepatic lesions.   11/23/2018 Tumor Marker   Patient's tumor was tested for the following markers: CA-125 Results of the tumor marker test revealed 253.   11/25/2018 Initial Diagnosis   Peritoneal carcinoma (HCC)   11/25/2018 Imaging   US paracentesis Successful ultrasound-guided paracentesis yielding 3.4 L of peritoneal fluid.   12/01/2018 Cancer Staging   Staging form: Ovary, Fallopian Tube, and Primary Peritoneal Carcinoma,  AJCC 8th Edition - Clinical stage from 12/01/2018: Stage IVA (rcT3, cN0, pM1a) - Signed by Artis Delay, MD on 02/14/2021 Stage prefix: Recurrence   12/02/2018 Tumor Marker   Patient's tumor was tested for the following markers: CA-125 Results of the tumor marker test revealed 267   12/06/2018 - 05/16/2019 Chemotherapy   The patient had carboplatin and taxol for chemotherapy treatment.     12/08/2018 Procedure   Successful ultrasound-guided therapeutic paracentesis yielding 1.7 liters of peritoneal fluid.   12/24/2018 Procedure   Successful placement of a right IJ approach Power Port with ultrasound and fluoroscopic guidance. The catheter is ready for use   01/17/2019 Tumor Marker   Patient's tumor was tested for the following markers: CA-125 Results of the tumor marker test revealed 57.2   01/31/2019 Imaging   Significant decrease in peritoneal carcinomatosis since previous study. Interval resolution of ascites.   Focal area of parenchymal consolidation in central right middle lobe, which measures 3 cm. Differential diagnosis includes infectious or inflammatory process, atelectasis, and neoplasm. Recommend short-term  follow-up by chest CT in 2-3 months.     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   Surgeon: Quinn Axe    Operative Findings:  : grossly normal uterus, ovaries normal, few scattered peritoneal nodules (1mm) on serosa of uterus and tubes. The omentum (gastrocolic) was tethered to the mesentery with tumor (thin rind). Complete (optimal) resection of tumor with no gross residual disease.     02/17/2019 Pathology Results   FINAL MICROSCOPIC DIAGNOSIS: A. UTERUS, CERVIX, BILATERAL FALLOPIAN TUBES AND OVARIES, HYSTERECTOMY WITH  SALPINGOOOPHORECTOMY: - Uterus: Endometrium: Inactive endometrium. No hyperplasia or malignancy. Myometrium: Unremarkable. No malignancy. Serosa: Metastatic carcinoma. No malignancy. - Cervix: Benign squamous and endocervical mucosa. No dysplasia or malignancy. - Left ovary and fallopian tube: Metastatic carcinoma. - Right ovary: No malignancy identified. - Right fallopian tube: Luminal tumor, see comment. B. OMENTUM, RESECTION: - High grade serous carcinoma. - Deposits up to at least 3 cm. - See oncology table. ONCOLOGY TABLE: OVARY or FALLOPIAN TUBE or PRIMARY PERITONEUM: Procedure: Hysterectomy with bilateral salpingo-oophorectomy and omentectomy. Specimen Integrity: Intact Tumor Site: Peritoneum Ovarian Surface Involvement (required only if applicable): Left ovary Fallopian Tube Surface Involvement (required only if applicable): Left Fallopian tube Tumor Size: Largest deposit 3 cm Histologic Type: High-grade serous carcinoma Histologic Grade: High-grade Implants (required for advanced stage serous/seromucinous borderline tumors only): Uterine serosa, left fallopian tube and ovary, omentum Other Tissue/ Organ Involvement: As above Largest Extrapelvic Peritoneal Focus (required only if applicable): 3 cm Peritoneal/Ascitic Fluid: Pre neoadjuvant [id number] positive for carcinoma. Treatment Effect (required only for high-grade serous carcinomas): Probably partial treatment effect in omental tissue. Regional Lymph Nodes: No lymph nodes submitted or found Pathologic Stage Classification (pTNM, AJCC 8th Edition): ypT3c, ypNX Representative Tumor Block: B5 Comment(s): There is surface involvement of the left ovary and fallopian tube but no primary tumor. Within the right fallopian tube lumen there is detached fragments of tumor but again no precursor lesion is noted in the right fallopian tube. Thus, the tumor is presumed primary peritoneal and staged as such.   03/21/2019 Tumor Marker    Patient's tumor was tested for the following markers: CA-125 Results of the tumor marker test revealed 15.1    Genetic Testing   Negative genetic testing. No pathogenic variants identified on the [employer] CancerNext + RNAinsight panel. VUS in ATM called [id number] identified. The report date is 05/12/2019. TumorNext HRD was originally ordered but there was not enough sample to complete this testing.   The CancerNext+RNAinsight gene panel offered by [employer] includes sequencing and rearrangement analysis for the following 36 genes: APC*, ATM*, AXIN2, BARD1, BMPR1A, BRCA1*, BRCA2*, BRIP1*, CDH1*, CDK4, CDKN2A, CHEK2*, DICER1, MLH1*, MSH2*, MSH3, MSH6*, MUTYH*, NBN, NF1*, NTHL1, PALB2*, PMS2*, PTEN*, RAD51C*, RAD51D*, RECQL, SMAD4, SMARCA4, STK11 and TP53* (sequencing and deletion/duplication); HOXB13, POLD1 and POLE (sequencing only); EPCAM and GREM1 (deletion/duplication only). DNA and RNA analyses performed for * genes.    05/16/2019 Tumor Marker   Patient's tumor was tested for the following markers: CA-125 Results of the tumor marker test revealed 10.7   06/16/2019 Imaging   1. No evidence of residual or recurrent metastatic disease in the chest, abdomen or pelvis. 2. Masslike focus of consolidation in the right middle lobe along the major and minor fissures with associated volume loss is stable since 01/31/2019. Indolent primary pulmonary neoplasm not  excluded. PET-CT may be considered for further characterization. 3. Aortic Atherosclerosis (ICD10-I70.0).   06/16/2019 Tumor Marker   Patient's tumor was tested for the following markers: CA-125 Results of the tumor marker test revealed 9.1.   12/05/2019 Tumor Marker   Patient's tumor was tested for the following markers: CA-125. Results of the tumor marker test revealed 9.0.   02/29/2020 Tumor Marker   Patient's tumor was tested for the following markers: CA-`125 Results of the tumor marker test revealed 9.7.   05/28/2020 Tumor Marker    Patient's tumor was tested for the following markers: CA-125 Results of the tumor marker test revealed 9.5   08/16/2020 Tumor Marker   Patient's tumor was tested for the following markers: CA-125 Results of the tumor marker test revealed 10.9   11/29/2020 Tumor Marker   Patient's tumor was tested for the following markers: CA-125. Results of the tumor marker test revealed 10.2.   01/22/2021 Pathology Results   SURGICAL PATHOLOGY  CASE: [phone]  PATIENT: Concha Se  Surgical Pathology Report   Clinical History: brain metastasis (cm)    FINAL MICROSCOPIC DIAGNOSIS:   A. 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    03/20/2021 Imaging   1. Unchanged enlarged midline superior mediastinal and left retroperitoneal lymph nodes, which remain suspicious for metastatic disease. 2. Unchanged masslike consolidation and volume loss of the right middle lobe measuring 3.8 x 1.9 cm, likely chronic scarring. Continued attention on follow-up. 3. No new evidence of metastatic disease in the chest, abdomen, or pelvis. 4. Status post hysterectomy and omentectomy.   Aortic Atherosclerosis (ICD10-I70.0).     03/21/2021 - 05/07/2021 Chemotherapy   Patient is on Treatment Plan : OVARIAN RECURRENT 3RD LINE Carboplatin D1 / Gemcitabine D1,8 (4/800) q21d     05/17/2021 Imaging   1. No new suspicious mass or lymphadenopathy identified in the chest, abdomen or pelvis. 2. Interval decreased size of previous enlarged superior mediastinal and left retroperitoneal lymph nodes. 3. Other ancillary findings as described.   05/21/2021 - 08/13/2021 Chemotherapy   Patient is on Treatment Plan : OVARIAN Gemcitabine D1,8 q21d      08/16/2021 Imaging   MRI brain  1. Regression of disease. Resolved marginal enhancement at the right middle frontal gyrus resection cavity and regressed  enhancement at the treated left parietal lesion.   2. No new metastatic disease or acute intracranial abnormality.   08/19/2021 Imaging   Slight decrease in size of small left retroperitoneal and superior mediastinal lymph nodes.   No new or progressive disease within the chest, abdomen, or pelvis.   Aortic Atherosclerosis (ICD10-I70.0).     11/29/2021 Imaging   1. Slightly decreased size of the small left retroperitoneal lymph node with stable superior mediastinal lymph node. 2. No new or progressive disease within the chest, abdomen or pelvis. 3.  Aortic Atherosclerosis (ICD10-I70.0).     03/27/2022 Imaging   1. No current findings of active malignancy. 2. Stable chronic atelectasis of the right middle lobe. 3. Mild aortoiliac atherosclerotic vascular calcification. 4. Degenerative subcortical cystic lesion along the left acetabulum. 5. Old healed left posterior sixth and seventh rib fractures. 6. Aortic atherosclerosis.   07/22/2022 Imaging   CT CHEST ABDOMEN PELVIS W CONTRAST  Result Date: 07/21/2022 CLINICAL DATA:  Restaging ovarian cancer.  * Tracking Code: BO *. EXAM: CT CHEST, ABDOMEN, AND PELVIS WITH CONTRAST TECHNIQUE: Multidetector CT imaging  of the chest, abdomen and pelvis was performed following the standard protocol during bolus administration of intravenous contrast. RADIATION DOSE REDUCTION: This exam was performed according to the departmental dose-optimization program which includes automated exposure control, adjustment of the mA and/or kV according to patient size and/or use of iterative reconstruction technique. CONTRAST:  80mL OMNIPAQUE IOHEXOL 300 MG/ML  SOLN COMPARISON:  03/25/2022 FINDINGS: CT CHEST FINDINGS Cardiovascular: Mild cardiac enlargement. No pericardial effusion. Aortic atherosclerotic calcifications. Mediastinum/Nodes: Thyroid gland, trachea, and esophagus are unremarkable. No enlarged mediastinal or hilar lymph nodes. No axillary adenopathy. Lungs/Pleura:  No pleural fluid. Stable appearance of atelectasis involving the right middle lobe. No suspicious pulmonary nodules identified. Musculoskeletal: T3 vertebral hemangioma. No aggressive or acute osseous findings. Remote left posterior rib fractures. CT ABDOMEN PELVIS FINDINGS Hepatobiliary: No focal liver abnormality is seen. No gallstones, gallbladder wall thickening, or biliary dilatation. Pancreas: Unremarkable. No pancreatic ductal dilatation or surrounding inflammatory changes. Spleen: Normal in size without focal abnormality. Adrenals/Urinary Tract: Normal adrenal glands. No kidney mass or hydronephrosis identified. Urinary bladder is unremarkable. Stomach/Bowel: Stomach appears normal. No pathologic dilatation of the large or small bowel loops. No bowel wall thickening or inflammation. Vascular/Lymphatic: Aortic atherosclerosis. No aneurysm. New soft tissue nodule or lymph node along the gastrohepatic ligament measures 0.7 cm, image 61/2. Adjacent nodule is also new measuring 0.8 cm, image 60/2. The previously referenced right gastric lymph node measuring 8 mm is unchanged, image 66/3. No enlarged pelvic or inguinal lymph nodes. Reproductive: Status post hysterectomy. No adnexal masses. Other: Status post prior omentectomy. No ascites. No discrete fluid collections identified. Musculoskeletal: No acute or significant osseous findings. IMPRESSION: 1. New soft tissue nodules or lymph nodes along the gastrohepatic ligament are identified measuring up to 0.8 cm. Recurrent peritoneal disease or nodal disease cannot be excluded. 2. Stable appearance of previously referenced right gastric lymph node measuring 8 mm. 3. No signs of metastatic disease to the chest. 4.  Aortic Atherosclerosis (ICD10-I70.0). Electronically Signed   By: Signa Kell M.D.   On: 07/21/2022 09:47        PHYSICAL EXAMINATION: ECOG PERFORMANCE STATUS: 1 - Symptomatic but completely ambulatory  Vitals:   07/22/22 0913  BP: (!) 139/58   Pulse: 77  Resp: 18  Temp: 97.7 F (36.5 C)  SpO2: 100%   Filed Weights   07/22/22 0913  Weight: 97 lb (44 kg)    GENERAL:alert, no distress and comfortable  NEURO: alert & oriented x 3 with fluent speech, no focal motor/sensory deficits  LABORATORY DATA:  I have reviewed the data as listed    Component Value Date/Time   NA 137 07/17/2022 0733   K 3.8 07/17/2022 0733   CL 101 07/17/2022 0733   CO2 29 07/17/2022 0733   GLUCOSE 92 07/17/2022 0733   BUN 10 07/17/2022 0733   CREATININE 0.53 07/17/2022 0733   CALCIUM 9.3 07/17/2022 0733   PROT 7.3 07/17/2022 0733   ALBUMIN 4.5 07/17/2022 0733   AST 22 07/17/2022 0733   ALT 15 07/17/2022 0733   ALKPHOS 67 07/17/2022 0733   BILITOT 0.5 07/17/2022 0733   GFRNONAA >60 07/17/2022 0733   GFRAA >60 12/05/2019 1145    No results found for: "SPEP", "UPEP"  Lab Results  Component Value Date   WBC 6.0 07/17/2022   NEUTROABS 3.8 07/17/2022   HGB 11.9 (L) 07/17/2022   HCT 35.1 (L) 07/17/2022   MCV 90.0 07/17/2022   PLT 216 07/17/2022      Chemistry  Component Value Date/Time   NA 137 07/17/2022 0733   K 3.8 07/17/2022 0733   CL 101 07/17/2022 0733   CO2 29 07/17/2022 0733   BUN 10 07/17/2022 0733   CREATININE 0.53 07/17/2022 0733      Component Value Date/Time   CALCIUM 9.3 07/17/2022 0733   ALKPHOS 67 07/17/2022 0733   AST 22 07/17/2022 0733   ALT 15 07/17/2022 0733   BILITOT 0.5 07/17/2022 0733       RADIOGRAPHIC STUDIES: I spent majority of our time reviewing multiple imaging studies with the patient I have personally reviewed the radiological images as listed and agreed with the findings in the report. CT CHEST ABDOMEN PELVIS W CONTRAST  Result Date: 07/21/2022 CLINICAL DATA:  Restaging ovarian cancer.  * Tracking Code: BO *. EXAM: CT CHEST, ABDOMEN, AND PELVIS WITH CONTRAST TECHNIQUE: Multidetector CT imaging of the chest, abdomen and pelvis was performed following the standard protocol during bolus  administration of intravenous contrast. RADIATION DOSE REDUCTION: This exam was performed according to the departmental dose-optimization program which includes automated exposure control, adjustment of the mA and/or kV according to patient size and/or use of iterative reconstruction technique. CONTRAST:  80mL OMNIPAQUE IOHEXOL 300 MG/ML  SOLN COMPARISON:  03/25/2022 FINDINGS: CT CHEST FINDINGS Cardiovascular: Mild cardiac enlargement. No pericardial effusion. Aortic atherosclerotic calcifications. Mediastinum/Nodes: Thyroid gland, trachea, and esophagus are unremarkable. No enlarged mediastinal or hilar lymph nodes. No axillary adenopathy. Lungs/Pleura: No pleural fluid. Stable appearance of atelectasis involving the right middle lobe. No suspicious pulmonary nodules identified. Musculoskeletal: T3 vertebral hemangioma. No aggressive or acute osseous findings. Remote left posterior rib fractures. CT ABDOMEN PELVIS FINDINGS Hepatobiliary: No focal liver abnormality is seen. No gallstones, gallbladder wall thickening, or biliary dilatation. Pancreas: Unremarkable. No pancreatic ductal dilatation or surrounding inflammatory changes. Spleen: Normal in size without focal abnormality. Adrenals/Urinary Tract: Normal adrenal glands. No kidney mass or hydronephrosis identified. Urinary bladder is unremarkable. Stomach/Bowel: Stomach appears normal. No pathologic dilatation of the large or small bowel loops. No bowel wall thickening or inflammation. Vascular/Lymphatic: Aortic atherosclerosis. No aneurysm. New soft tissue nodule or lymph node along the gastrohepatic ligament measures 0.7 cm, image 61/2. Adjacent nodule is also new measuring 0.8 cm, image 60/2. The previously referenced right gastric lymph node measuring 8 mm is unchanged, image 66/3. No enlarged pelvic or inguinal lymph nodes. Reproductive: Status post hysterectomy. No adnexal masses. Other: Status post prior omentectomy. No ascites. No discrete fluid  collections identified. Musculoskeletal: No acute or significant osseous findings. IMPRESSION: 1. New soft tissue nodules or lymph nodes along the gastrohepatic ligament are identified measuring up to 0.8 cm. Recurrent peritoneal disease or nodal disease cannot be excluded. 2. Stable appearance of previously referenced right gastric lymph node measuring 8 mm. 3. No signs of metastatic disease to the chest. 4.  Aortic Atherosclerosis (ICD10-I70.0). Electronically Signed   By: Signa Kell M.D.   On: 07/21/2022 09:47

## 2022-07-22 NOTE — Assessment & Plan Note (Signed)
She complained of excessive fatigue Her hemoglobin is just borderline low at 11.9 and I do not believe this is the cause of her excessive fatigue

## 2022-07-24 DIAGNOSIS — H43813 Vitreous degeneration, bilateral: Secondary | ICD-10-CM | POA: Diagnosis not present

## 2022-07-24 DIAGNOSIS — H2513 Age-related nuclear cataract, bilateral: Secondary | ICD-10-CM | POA: Diagnosis not present

## 2022-07-24 DIAGNOSIS — H40033 Anatomical narrow angle, bilateral: Secondary | ICD-10-CM | POA: Diagnosis not present

## 2022-07-24 DIAGNOSIS — H35372 Puckering of macula, left eye: Secondary | ICD-10-CM | POA: Diagnosis not present

## 2022-07-24 DIAGNOSIS — H401421 Capsular glaucoma with pseudoexfoliation of lens, left eye, mild stage: Secondary | ICD-10-CM | POA: Diagnosis not present

## 2022-07-31 ENCOUNTER — Ambulatory Visit
Admission: RE | Admit: 2022-07-31 | Discharge: 2022-07-31 | Disposition: A | Discharge status: Home / Self Care | Payer: Medicare Other | Source: Ambulatory Visit | Attending: Internal Medicine | Admitting: Internal Medicine

## 2022-07-31 DIAGNOSIS — C7931 Secondary malignant neoplasm of brain: Secondary | ICD-10-CM

## 2022-07-31 DIAGNOSIS — C729 Malignant neoplasm of central nervous system, unspecified: Secondary | ICD-10-CM | POA: Diagnosis not present

## 2022-07-31 MED ORDER — GADOPICLENOL 0.5 MMOL/ML IV SOLN
5.0000 mL | Freq: Once | INTRAVENOUS | Status: AC | PRN
  Administered 2022-07-31: 5 mL via INTRAVENOUS

## 2022-08-01 ENCOUNTER — Telehealth: Payer: Self-pay | Admitting: *Deleted

## 2022-08-01 NOTE — Telephone Encounter (Signed)
Patient called and reported areas of itchy rash on lower legs and arm. Areas are patchy, slightly reddened and flat. Per patient, they come and go. She states she has not changed anything like detergent or soaps or been around anything that has caused a rash in past. She states it was worse Friday morning, but has lessened as day goes on. She asked if she received a different contrast for Brain MRI on Thursday than last time. Reviewed records and informed her gadopiclenol (VUEWAY) IV was used for MRI Thursday and in January.   [name] [name] said since it was getting better, she didn't want this writer to contact Dr. Barbaraann Cao as she was seeing him  on Monday and would discuss with him then if it hadn't cleared up. Informed her that call message would still be forwarded to him.  Advised to use cool water, then pat dry and use any OTC anti itch lotion if needed. [name] [name] said if rash got worse or spread, she would contact [facility] for After Hours Triage service or go to urgent care.  Message forwarded to MD as Lorain Childes

## 2022-08-04 ENCOUNTER — Other Ambulatory Visit: Payer: Self-pay

## 2022-08-04 ENCOUNTER — Inpatient Hospital Stay (HOSPITAL_BASED_OUTPATIENT_CLINIC_OR_DEPARTMENT_OTHER): Payer: Medicare Other | Admitting: Internal Medicine

## 2022-08-04 ENCOUNTER — Inpatient Hospital Stay: Payer: Medicare Other

## 2022-08-04 VITALS — BP 127/50 | HR 63 | Temp 98.2°F | Resp 16 | Ht 65.0 in | Wt 98.3 lb

## 2022-08-04 DIAGNOSIS — R569 Unspecified convulsions: Secondary | ICD-10-CM | POA: Diagnosis not present

## 2022-08-04 DIAGNOSIS — G620 Drug-induced polyneuropathy: Secondary | ICD-10-CM | POA: Diagnosis not present

## 2022-08-04 DIAGNOSIS — T451X5A Adverse effect of antineoplastic and immunosuppressive drugs, initial encounter: Secondary | ICD-10-CM | POA: Diagnosis not present

## 2022-08-04 DIAGNOSIS — C7931 Secondary malignant neoplasm of brain: Secondary | ICD-10-CM | POA: Diagnosis not present

## 2022-08-04 DIAGNOSIS — C482 Malignant neoplasm of peritoneum, unspecified: Secondary | ICD-10-CM | POA: Diagnosis not present

## 2022-08-04 MED ORDER — PREDNISONE 50 MG PO TABS: 50.0000 mg | ORAL_TABLET | Freq: Every day | ORAL | 0 refills | Status: DC

## 2022-08-04 NOTE — Progress Notes (Signed)
[facility] [address]  [place], [place] [address] [phone]   Interval Evaluation  Date of Service: 08/04/22 Patient Name: [name] [name] Patient MRN: [id number] Patient DOB: 20-Feb-1951 Provider: Henreitta Leber, MD  Identifying Statement:  [name] [name] is a 72 y.o. 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 Findings highly suspicious for peritoneal carcinomatosis. Recommend paracentesis for therapeutic and diagnostic purposes. I do not see an obvious primary lesion but there is some irregular enhancing soft tissue in the right adnexal area. CA 125 level may be helpful. 2. Mild surface irregularity involving the liver but I do not see any obvious changes of cirrhosis. No hepatic lesions.   11/23/2018 Tumor Marker   Patient's tumor was tested for the following markers: CA-125 Results of the tumor marker test revealed 253.   11/25/2018 Initial Diagnosis   Peritoneal carcinoma (HCC)   11/25/2018 Imaging   US paracentesis Successful ultrasound-guided paracentesis yielding 3.4 L of peritoneal fluid.   12/01/2018 Cancer Staging   Staging form: Ovary, Fallopian Tube, and Primary Peritoneal Carcinoma, AJCC 8th Edition - Clinical stage from 12/01/2018: Stage IVA (rcT3, cN0, pM1a) - Signed by Artis Delay, MD on 02/14/2021 Stage prefix: Recurrence   12/02/2018 Tumor Marker   Patient's tumor was tested for the following markers: CA-125 Results of the tumor marker test revealed 267   12/06/2018 - 05/16/2019 Chemotherapy   The patient had carboplatin and taxol for chemotherapy treatment.     12/08/2018  Procedure   Successful ultrasound-guided therapeutic paracentesis yielding 1.7 liters of peritoneal fluid.   12/24/2018 Procedure   Successful placement of a right IJ approach Power Port with ultrasound and fluoroscopic guidance. The catheter is ready for use   01/17/2019 Tumor Marker   Patient's tumor was tested for the following markers: CA-125 Results of the tumor marker test revealed 57.2   01/31/2019 Imaging   Significant decrease in peritoneal carcinomatosis since previous study. Interval resolution of ascites.   Focal area of parenchymal consolidation in central right middle lobe, which measures 3 cm. Differential diagnosis includes infectious or inflammatory process, atelectasis, and neoplasm. Recommend short-term follow-up by chest CT in 2-3 months.     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   Surgeon: Quinn Axe    Operative Findings:  : grossly normal uterus, ovaries normal, few scattered peritoneal nodules (1mm) on serosa of uterus and tubes. The omentum (gastrocolic) was tethered to the mesentery with tumor (thin rind). Complete (optimal) resection of tumor with no gross residual disease.     02/17/2019 Pathology Results   FINAL MICROSCOPIC DIAGNOSIS: A. UTERUS, CERVIX, BILATERAL FALLOPIAN TUBES AND OVARIES, HYSTERECTOMY WITH SALPINGOOOPHORECTOMY: - Uterus: Endometrium: Inactive endometrium. No hyperplasia or malignancy. Myometrium: Unremarkable. No malignancy. Serosa: Metastatic carcinoma. No malignancy. - Cervix: Benign squamous and endocervical mucosa. No dysplasia or malignancy. - Left ovary and fallopian tube: Metastatic carcinoma. - Right ovary: No malignancy identified. - Right fallopian tube: Luminal tumor,  see comment.  B. OMENTUM, RESECTION: - High grade serous carcinoma. - Deposits up to at least 3 cm. - See oncology table. ONCOLOGY TABLE: OVARY or FALLOPIAN TUBE or PRIMARY PERITONEUM: Procedure: Hysterectomy with bilateral salpingo-oophorectomy and omentectomy. Specimen Integrity: Intact Tumor Site: Peritoneum Ovarian Surface Involvement (required only if applicable): Left ovary Fallopian Tube Surface Involvement (required only if applicable): Left Fallopian tube Tumor Size: Largest deposit 3 cm Histologic Type: High-grade serous carcinoma Histologic Grade: High-grade Implants (required for advanced stage serous/seromucinous borderline tumors only): Uterine serosa, left fallopian tube and ovary, omentum Other Tissue/ Organ Involvement: As above Largest Extrapelvic Peritoneal Focus (required only if applicable): 3 cm Peritoneal/Ascitic Fluid: Pre neoadjuvant [id number] positive for carcinoma. Treatment Effect (required only for high-grade serous carcinomas): Probably partial treatment effect in omental tissue. Regional Lymph Nodes: No lymph nodes submitted or found Pathologic Stage Classification (pTNM, AJCC 8th Edition): ypT3c, ypNX Representative Tumor Block: B5 Comment(s): There is surface involvement of the left ovary and fallopian tube but no primary tumor. Within the right fallopian tube lumen there is detached fragments of tumor but again no precursor lesion is noted in the right fallopian tube. Thus, the tumor is presumed primary peritoneal and staged as such.   03/21/2019 Tumor Marker   Patient's tumor was tested for the following markers: CA-125 Results of the tumor marker test revealed 15.1    Genetic Testing   Negative genetic testing. No pathogenic variants identified on the [employer] CancerNext + RNAinsight panel. VUS in ATM called [id number] identified. The report date is 05/12/2019. TumorNext HRD was originally ordered but there was not enough sample to complete this testing.    The CancerNext+RNAinsight gene panel offered by [employer] includes sequencing and rearrangement analysis for the following 36 genes: APC*, ATM*, AXIN2, BARD1, BMPR1A, BRCA1*, BRCA2*, BRIP1*, CDH1*, CDK4, CDKN2A, CHEK2*, DICER1, MLH1*, MSH2*, MSH3, MSH6*, MUTYH*, NBN, NF1*, NTHL1, PALB2*, PMS2*, PTEN*, RAD51C*, RAD51D*, RECQL, SMAD4, SMARCA4, STK11 and TP53* (sequencing and deletion/duplication); HOXB13, POLD1 and POLE (sequencing only); EPCAM and GREM1 (deletion/duplication only). DNA and RNA analyses performed for * genes.    05/16/2019 Tumor Marker   Patient's tumor was tested for the following markers: CA-125 Results of the tumor marker test revealed 10.7   06/16/2019 Imaging   1. No evidence of residual or recurrent metastatic disease in the chest, abdomen or pelvis. 2. Masslike focus of consolidation in the right middle lobe along the major and minor fissures with associated volume loss is stable since 01/31/2019. Indolent primary pulmonary neoplasm not excluded. PET-CT may be considered for further characterization. 3. Aortic Atherosclerosis (ICD10-I70.0).   06/16/2019 Tumor Marker   Patient's tumor was tested for the following markers: CA-125 Results of the tumor marker test revealed 9.1.   12/05/2019 Tumor Marker   Patient's tumor was tested for the following markers: CA-125. Results of the tumor marker test revealed 9.0.   02/29/2020 Tumor Marker   Patient's tumor was tested for the following markers: CA-`125 Results of the tumor marker test revealed 9.7.   05/28/2020 Tumor Marker   Patient's tumor was tested for the following markers: CA-125 Results of the tumor marker test revealed 9.5   08/16/2020 Tumor Marker   Patient's tumor was tested for the following markers: CA-125 Results of the tumor marker test revealed 10.9   11/29/2020 Tumor Marker   Patient's tumor was tested for the following markers: CA-125. Results of the tumor marker test revealed 10.2.   01/22/2021  Pathology Results   SURGICAL PATHOLOGY  CASE: [phone]  PATIENT: [name] [name]  Surgical Pathology Report   Clinical History: brain metastasis (cm)    FINAL MICROSCOPIC DIAGNOSIS:   A. 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    03/20/2021 Imaging   1. Unchanged enlarged midline superior mediastinal and left retroperitoneal lymph nodes, which remain suspicious for metastatic disease. 2. Unchanged masslike consolidation and volume loss of the right middle lobe measuring 3.8 x 1.9 cm, likely chronic scarring. Continued attention on follow-up. 3. No new evidence of metastatic disease in the chest, abdomen, or pelvis. 4. Status post hysterectomy and omentectomy.   Aortic Atherosclerosis (ICD10-I70.0).     03/21/2021 - 05/07/2021 Chemotherapy   Patient is on Treatment Plan : OVARIAN RECURRENT 3RD LINE Carboplatin D1 / Gemcitabine D1,8 (4/800) q21d     05/17/2021 Imaging   1. No new suspicious mass or lymphadenopathy identified in the chest, abdomen or pelvis. 2. Interval decreased size of previous enlarged superior mediastinal and left retroperitoneal lymph nodes. 3. Other ancillary findings as described.   05/21/2021 - 08/13/2021 Chemotherapy   Patient is on Treatment Plan : OVARIAN Gemcitabine D1,8 q21d      08/16/2021 Imaging   MRI brain  1. Regression of disease. Resolved marginal enhancement at the right middle frontal gyrus resection cavity and regressed enhancement at the treated left parietal lesion.   2. No new metastatic disease or acute intracranial abnormality.   08/19/2021 Imaging   Slight decrease in size of small left retroperitoneal and superior mediastinal lymph nodes.   No new or progressive disease within the chest, abdomen, or pelvis.   Aortic Atherosclerosis (ICD10-I70.0).     11/29/2021 Imaging   1. Slightly decreased size  of the small left retroperitoneal lymph node with stable superior mediastinal lymph node. 2. No new or progressive disease within the chest, abdomen or pelvis. 3.  Aortic Atherosclerosis (ICD10-I70.0).     03/27/2022 Imaging   1. No current findings of active malignancy. 2. Stable chronic atelectasis of the right middle lobe. 3. Mild aortoiliac atherosclerotic vascular calcification. 4. Degenerative subcortical cystic lesion along the left acetabulum. 5. Old healed left posterior sixth and seventh rib fractures. 6. Aortic atherosclerosis.   07/22/2022 Imaging   CT CHEST ABDOMEN PELVIS W CONTRAST  Result Date: 07/21/2022 CLINICAL DATA:  Restaging ovarian cancer.  * Tracking Code: BO *. EXAM: CT CHEST, ABDOMEN, AND PELVIS WITH CONTRAST TECHNIQUE: Multidetector CT imaging of the chest, abdomen and pelvis was performed following the standard protocol during bolus administration of intravenous contrast. RADIATION DOSE REDUCTION: This exam was performed according to the departmental dose-optimization program which includes automated exposure control, adjustment of the mA and/or kV according to patient size and/or use of iterative reconstruction technique. CONTRAST:  80mL OMNIPAQUE IOHEXOL 300 MG/ML  SOLN COMPARISON:  03/25/2022 FINDINGS: CT CHEST FINDINGS Cardiovascular: Mild cardiac enlargement. No pericardial effusion. Aortic atherosclerotic calcifications. Mediastinum/Nodes: Thyroid gland, trachea, and esophagus are unremarkable. No enlarged mediastinal or hilar lymph nodes. No axillary adenopathy. Lungs/Pleura: No pleural fluid. Stable appearance of atelectasis involving the right middle lobe. No suspicious pulmonary nodules identified. Musculoskeletal: T3 vertebral hemangioma. No aggressive or acute osseous findings. Remote left posterior rib fractures. CT ABDOMEN PELVIS FINDINGS Hepatobiliary: No focal liver abnormality is seen. No gallstones, gallbladder wall thickening, or biliary dilatation. Pancreas:  Unremarkable. No pancreatic ductal dilatation or surrounding inflammatory changes. Spleen: Normal in size without focal abnormality. Adrenals/Urinary Tract: Normal adrenal glands. No kidney mass  or hydronephrosis identified. Urinary bladder is unremarkable. Stomach/Bowel: Stomach appears normal. No pathologic dilatation of the large or small bowel loops. No bowel wall thickening or inflammation. Vascular/Lymphatic: Aortic atherosclerosis. No aneurysm. New soft tissue nodule or lymph node along the gastrohepatic ligament measures 0.7 cm, image 61/2. Adjacent nodule is also new measuring 0.8 cm, image 60/2. The previously referenced right gastric lymph node measuring 8 mm is unchanged, image 66/3. No enlarged pelvic or inguinal lymph nodes. Reproductive: Status post hysterectomy. No adnexal masses. Other: Status post prior omentectomy. No ascites. No discrete fluid collections identified. Musculoskeletal: No acute or significant osseous findings. IMPRESSION: 1. New soft tissue nodules or lymph nodes along the gastrohepatic ligament are identified measuring up to 0.8 cm. Recurrent peritoneal disease or nodal disease cannot be excluded. 2. Stable appearance of previously referenced right gastric lymph node measuring 8 mm. 3. No signs of metastatic disease to the chest. 4.  Aortic Atherosclerosis (ICD10-I70.0). Electronically Signed   By: Signa Kell M.D.   On: 07/21/2022 09:47       CNS Oncologic History 01/22/21: R frontal craniotomy, resection with Dr. Jake Samples 02/21/21: Post-op SRS and SRS to L parietal target Kathrynn Running)  Interval History: [name] [name] presents for follow up.  Vertigo, dizziness are unchanged from prior.  Denies further seizures aside from 2-3 episodes of facial numbness.  Did develop a mild rash after the MRI this time.  Continues on Keppra 1000mg  twice per day.  Continues on observation with Dr. Bertis Ruddy.  H+P (12/25/20) Patient presented to the ED on 12/22/20 with twitching of her  left side of her face for 1 to 2 minutes, accompanied by slurring of speech.  There was no tongue biting, bowel or bladder incontinence.  Workup demonstrated two brain lesions consistent with suspected metastases.  She was started on Keppra (no decadron) and discharged two days later with no further seizure like events.  Priro to admission she had also been complaining of dizziness, confusion, impaired memory.  Has been seeing Dr. Bertis Ruddy since 2021 for primary peritoneal carcinoma.   Medications: Current Outpatient Medications on File Prior to Visit  Medication Sig Dispense Refill   Azelaic Acid 15 % cream Apply 1 application topically daily.     cholecalciferol (VITAMIN D3) 25 MCG (1000 UNIT) tablet Take 2,000 Units by mouth daily.     gabapentin (NEURONTIN) 300 MG capsule Take 300 mg by mouth 3 (three) times daily.     levETIRAcetam (KEPPRA) 500 MG tablet TAKE 2 TABLETS(1000 MG) BY MOUTH TWICE DAILY 120 tablet 3   lidocaine-prilocaine (EMLA) cream Apply to affected area(s) as needed. 30 g 3   LORazepam (ATIVAN) 2 MG tablet Take 1 tablet (2 mg total) by mouth every 6 (six) hours as needed for seizure. 10 tablet 0   Magnesium 250 MG TABS Take 250 mg by mouth daily as needed.     prochlorperazine (COMPAZINE) 10 MG tablet Take 1 tablet (10 mg total) by mouth every 6 (six) hours as needed (Nausea or vomiting). (Patient not taking: Reported on 04/07/2022) 30 tablet 1   Sulfacetamide Sodium, Acne, 10 % LOTN Apply 1 application topically at bedtime.     No current facility-administered medications on file prior to visit.    Allergies:  Allergies  Allergen Reactions   Carboplatin Itching and Rash    Patient has a hypersensitivity reaction to carboplatin.  See note from 04/30/2021 at 1439.  Reaction symptoms included itching of mouth and ears, burning in chest, hypotension, and rash to neck  and arms.  Patient unable to complete infusion.   Fentanyl Palpitations    Palpitations , fever ,headahe, N/V   " loud pounding heart beat"    Vancomycin Rash   Milk (Cow) Diarrhea and Nausea And Vomiting   Brimonidine Tartrate     Other reaction(s): blurred vision   Brimonidine Tartrate     Other reaction(s): blurred vision   Ditropan [Oxybutynin]    Milk-Related Compounds Diarrhea and Nausea And Vomiting   Other Other (See Comments)   Oxybutynin Chloride Other (See Comments)    Ditropan  Pt doesn't remember    Thimerosal (Thiomersal) Other (See Comments)    Pt doesn't remember   Tramadol Other (See Comments)    Pt doesn't remember   Avelox [Moxifloxacin Hcl In Nacl] Rash   Doxycycline Rash   Erythromycin Rash   Metronidazole Rash   Minocycline Rash   Penicillins Rash    Did it involve swelling of the face/tongue/throat, SOB, or low BP? No Did it involve sudden or severe rash/hives, skin peeling, or any reaction on the inside of your mouth or nose? Yes, rash. Did you need to seek medical attention at a hospital or doctor's office? No When did it last happen?      Childhood allergy If all above answers are "NO", may proceed with cephalosporin use.    Quinolones Rash   Sulfonamide Derivatives Rash   Past Medical History:  Past Medical History:  Diagnosis Date   Allergic rhinitis    Aortic atherosclerosis (HCC)    Aortic insufficiency    Mild echo 2024   Aortic insufficiency    mild by echo 10/2021   Cervical dysplasia 1980   Cervicalgia    Eczema    Rosacea,dermatitis-Dr Stinehelfer   Esophageal reflux    Family history of colon cancer    Family history of melanoma    Family history of ovarian cancer    Family history of pancreatic cancer    Family history of prostate cancer    Family history of uterine cancer    Fibromyalgia    GERD (gastroesophageal reflux disease)    Glaucoma, narrow-angle    Heart murmur    Hiatal hernia    Irritable bowel syndrome    LBBB (left bundle branch block)    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), unspecified site    DCIS   Mass of lung     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 Edwyna Shell   Mitral valve disorders(424.0)    No MVP and trivial MR by echo 10/2021   Osteoporosis 12/2016   T score -2.3, 2014 T score -2.5 AP spine   PAC (premature atrial contraction) 2012   ,PVC's, and nonsustained atril tachycardia w aberration by heart monitor    Pulmonic valve insufficiency    mild by echo 8/23   PVC (premature ventricular contraction)    Rosacea    Stricture and stenosis of esophagus    Tricuspid insufficiency    mild by echo 10/2021   Past Surgical History:  Past Surgical History:  Procedure Laterality Date   APPLICATION OF CRANIAL NAVIGATION N/A 01/22/2021   Procedure: APPLICATION OF CRANIAL NAVIGATION;  Surgeon: Bethann Goo, DO;  Location: [facility] OR;  Service: Neurosurgery;  Laterality: N/A;   BREAST LUMPECTOMY  1998   right breast with radiation   CERVICAL DISCECTOMY     C3-7 with bone graft, in three different surgeries, have titanium plate and screws   CRANIOTOMY N/A 01/22/2021   Procedure: STEREOTACTIC RIGHT  FRONTAL CRANIOTOMY FOR RESECTION OF TUMOR;  Surgeon: Dawley, Alan Mulder, DO;  Location: [facility] OR;  Service: Neurosurgery;  Laterality: N/A;   GYNECOLOGIC CRYOSURGERY  1980   IR IMAGING GUIDED PORT INSERTION  12/24/2018   NECK SURGERY  1993-1997   bone graft and fusion   OMENTECTOMY  02/17/2019   REFRACTIVE SURGERY     narrow angle glucoma   RHINOPLASTY  1976   sterotactic large core needle biopsy right breast Right 10/15/1996   TUBAL LIGATION  1977   Social History:  Social History   Socioeconomic History   Marital status: Married    Spouse name: Hessie Diener   Number of children: 0   Years of education: Not on file   Highest education level: Not on file  Occupational History   Occupation: Educational psychologist: [employer]  Tobacco Use   Smoking status: Never   Smokeless tobacco: Never  Vaping Use   Vaping Use: Never used  Substance and Sexual Activity   Alcohol use: Yes    Alcohol/week: 2.0  standard drinks of alcohol    Types: 2 Standard drinks or equivalent per week    Comment: Social    Drug use: No   Sexual activity: Not Currently    Birth control/protection: Post-menopausal    Comment: 1st intercourse 73 yo- More than 5 partners  Other Topics Concern   Not on file  Social History Narrative   Not on file   Social Determinants of Health   Financial Resource Strain: Not on file  Food Insecurity: Not on file  Transportation Needs: Not on file  Physical Activity: Not on file  Stress: Not on file  Social Connections: Not on file  Intimate Partner Violence: Not on file   Family History:  Family History  Problem Relation Age of Onset   Heart disease Mother    Depression Mother    Hypertension Mother    Heart disease Father    Hypertension Father    Aneurysm Father    Uterine cancer Sister 67   Hypertension Sister    Ovarian cancer Sister 54   Melanoma Sister 68   Depression Sister    Melanoma Sister 39   Heart disease Paternal Grandfather    Stroke Maternal Grandmother    Cancer Maternal Grandfather 63       Pancreatic   Stroke Paternal Grandmother    Cancer Paternal Uncle        multiple myeloma, prostate cancer and melanoma (separate primaries)   Colon cancer Neg Hx     Review of Systems: Constitutional: Doesn't report fevers, chills or abnormal weight loss Eyes: Doesn't report blurriness of vision Ears, nose, mouth, throat, and face: Doesn't report sore throat Respiratory: Doesn't report cough, dyspnea or wheezes Cardiovascular: Doesn't report palpitation, chest discomfort  Gastrointestinal:  Doesn't report nausea, constipation, diarrhea GU: Doesn't report incontinence Skin: Doesn't report skin rashes Neurological: Per HPI Musculoskeletal: Doesn't report joint pain Behavioral/Psych: Doesn't report anxiety  Physical Exam: Wt Readings from Last 3 Encounters:  07/22/22 97 lb (44 kg)  04/07/22 99 lb (44.9 kg)  03/31/22 98 lb 12.8 oz (44.8 kg)    Temp Readings from Last 3 Encounters:  07/22/22 97.7 F (36.5 C) (Tympanic)  03/31/22 98.4 F (36.9 C) (Temporal)  03/27/22 97.6 F (36.4 C) (Tympanic)   BP Readings from Last 3 Encounters:  07/22/22 (!) 139/58  04/07/22 125/66  03/31/22 135/69   Pulse Readings from Last 3 Encounters:  07/22/22 77  04/07/22  83  03/31/22 84   KPS: 80. General: Alert, cooperative, pleasant, in no acute distress Head: Normal EENT: No conjunctival injection or scleral icterus.  Lungs: Resp effort normal Cardiac: Regular rate Abdomen: Non-distended abdomen Skin: No rashes cyanosis or petechiae. Extremities: No clubbing or edema  Neurologic Exam: Mental Status: Awake, alert, attentive to examiner. Oriented to self and environment. Language is fluent with intact comprehension.  Cranial Nerves: Visual acuity is grossly normal. Visual fields are full. Extra-ocular movements intact. No ptosis. Face is symmetric Motor: Tone and bulk are normal. Power is full in both arms and legs. Reflexes are symmetric, no pathologic reflexes present.  Sensory: Intact to light touch Gait: Normal.   Labs: I have reviewed the data as listed    Component Value Date/Time   NA 137 07/17/2022 0733   K 3.8 07/17/2022 0733   CL 101 07/17/2022 0733   CO2 29 07/17/2022 0733   GLUCOSE 92 07/17/2022 0733   BUN 10 07/17/2022 0733   CREATININE 0.53 07/17/2022 0733   CALCIUM 9.3 07/17/2022 0733   PROT 7.3 07/17/2022 0733   ALBUMIN 4.5 07/17/2022 0733   AST 22 07/17/2022 0733   ALT 15 07/17/2022 0733   ALKPHOS 67 07/17/2022 0733   BILITOT 0.5 07/17/2022 0733   GFRNONAA >60 07/17/2022 0733   GFRAA >60 12/05/2019 1145   Lab Results  Component Value Date   WBC 6.0 07/17/2022   NEUTROABS 3.8 07/17/2022   HGB 11.9 (L) 07/17/2022   HCT 35.1 (L) 07/17/2022   MCV 90.0 07/17/2022   PLT 216 07/17/2022    Imaging:  [facility] Clinician Interpretation: I have personally reviewed the CNS images as listed.  My  interpretation, in the context of the patient's clinical presentation, is stable disease  CT CHEST ABDOMEN PELVIS W CONTRAST  Result Date: 07/21/2022 CLINICAL DATA:  Restaging ovarian cancer.  * Tracking Code: BO *. EXAM: CT CHEST, ABDOMEN, AND PELVIS WITH CONTRAST TECHNIQUE: Multidetector CT imaging of the chest, abdomen and pelvis was performed following the standard protocol during bolus administration of intravenous contrast. RADIATION DOSE REDUCTION: This exam was performed according to the departmental dose-optimization program which includes automated exposure control, adjustment of the mA and/or kV according to patient size and/or use of iterative reconstruction technique. CONTRAST:  80mL OMNIPAQUE IOHEXOL 300 MG/ML  SOLN COMPARISON:  03/25/2022 FINDINGS: CT CHEST FINDINGS Cardiovascular: Mild cardiac enlargement. No pericardial effusion. Aortic atherosclerotic calcifications. Mediastinum/Nodes: Thyroid gland, trachea, and esophagus are unremarkable. No enlarged mediastinal or hilar lymph nodes. No axillary adenopathy. Lungs/Pleura: No pleural fluid. Stable appearance of atelectasis involving the right middle lobe. No suspicious pulmonary nodules identified. Musculoskeletal: T3 vertebral hemangioma. No aggressive or acute osseous findings. Remote left posterior rib fractures. CT ABDOMEN PELVIS FINDINGS Hepatobiliary: No focal liver abnormality is seen. No gallstones, gallbladder wall thickening, or biliary dilatation. Pancreas: Unremarkable. No pancreatic ductal dilatation or surrounding inflammatory changes. Spleen: Normal in size without focal abnormality. Adrenals/Urinary Tract: Normal adrenal glands. No kidney mass or hydronephrosis identified. Urinary bladder is unremarkable. Stomach/Bowel: Stomach appears normal. No pathologic dilatation of the large or small bowel loops. No bowel wall thickening or inflammation. Vascular/Lymphatic: Aortic atherosclerosis. No aneurysm. New soft tissue nodule or  lymph node along the gastrohepatic ligament measures 0.7 cm, image 61/2. Adjacent nodule is also new measuring 0.8 cm, image 60/2. The previously referenced right gastric lymph node measuring 8 mm is unchanged, image 66/3. No enlarged pelvic or inguinal lymph nodes. Reproductive: Status post hysterectomy. No adnexal masses. Other: Status post prior  omentectomy. No ascites. No discrete fluid collections identified. Musculoskeletal: No acute or significant osseous findings. IMPRESSION: 1. New soft tissue nodules or lymph nodes along the gastrohepatic ligament are identified measuring up to 0.8 cm. Recurrent peritoneal disease or nodal disease cannot be excluded. 2. Stable appearance of previously referenced right gastric lymph node measuring 8 mm. 3. No signs of metastatic disease to the chest. 4.  Aortic Atherosclerosis (ICD10-I70.0). Electronically Signed   By: Signa Kell M.D.   On: 07/21/2022 09:47     Assessment/Plan Secondary malignant neoplasm of brain [facility])  Peripheral neuropathy due to chemotherapy [facility])  Seizure (HCC)  [name] [name] is clinically and radiographically stable today.  For focal seizures, will con't Keppra 1000mg  BID.  Ativan 2mg  will be provided as emergency/rescue seizure medication.   Will con't to follow with Dr. Bertis Ruddy as well.  Recommended referral to palliative care given survivorship quality of life concerns, fatigue, depression.  We appreciate the opportunity to participate in the care of [name] [name].  We ask that [name] [name] return to clinic in 6 months following next brain MRI, or sooner as needed.  She may dose prednisone 50mg  x3 days following MRI this next time.    All questions were answered. The patient knows to call the clinic with any problems, questions or concerns. No barriers to learning were detected.  The total time spent in the encounter was 30 minutes and more than 50% was on counseling and review of test results   Henreitta Leber, MD Medical Director of Neuro-Oncology [facility] 08/04/22 10:26 AM

## 2022-08-12 ENCOUNTER — Telehealth: Payer: Self-pay

## 2022-08-12 ENCOUNTER — Other Ambulatory Visit: Payer: Self-pay | Admitting: Radiation Therapy

## 2022-08-12 NOTE — Telephone Encounter (Signed)
Pt called to cancel her palliative appt, reporting that she is trying lifestyle changes prior to seeing palliative care. Pt also had questions about insurance coverage, RN educated pt on what palliative care is, how it can help, insurance coverage, as well as how to get back in touch with [name]. Pt verbalized understanding, no further needs at this time.

## 2022-08-13 DIAGNOSIS — C7931 Secondary malignant neoplasm of brain: Secondary | ICD-10-CM | POA: Diagnosis not present

## 2022-08-13 DIAGNOSIS — Z6820 Body mass index (BMI) 20.0-20.9, adult: Secondary | ICD-10-CM | POA: Diagnosis not present

## 2022-09-01 ENCOUNTER — Other Ambulatory Visit: Payer: Self-pay | Admitting: Internal Medicine

## 2022-09-04 DIAGNOSIS — H9202 Otalgia, left ear: Secondary | ICD-10-CM | POA: Diagnosis not present

## 2022-09-04 DIAGNOSIS — H6992 Unspecified Eustachian tube disorder, left ear: Secondary | ICD-10-CM | POA: Diagnosis not present

## 2022-09-04 DIAGNOSIS — H9312 Tinnitus, left ear: Secondary | ICD-10-CM | POA: Diagnosis not present

## 2022-09-04 DIAGNOSIS — M950 Acquired deformity of nose: Secondary | ICD-10-CM | POA: Diagnosis not present

## 2022-09-11 ENCOUNTER — Other Ambulatory Visit: Payer: Self-pay

## 2022-09-11 ENCOUNTER — Inpatient Hospital Stay: Payer: Medicare Other | Attending: Gynecologic Oncology

## 2022-09-11 DIAGNOSIS — C482 Malignant neoplasm of peritoneum, unspecified: Secondary | ICD-10-CM | POA: Diagnosis present

## 2022-09-11 DIAGNOSIS — Z452 Encounter for adjustment and management of vascular access device: Secondary | ICD-10-CM | POA: Insufficient documentation

## 2022-09-11 MED ORDER — SODIUM CHLORIDE 0.9% FLUSH
10.0000 mL | Freq: Once | INTRAVENOUS | Status: AC
  Administered 2022-09-11: 10 mL

## 2022-09-11 MED ORDER — HEPARIN SOD (PORK) LOCK FLUSH 100 UNIT/ML IV SOLN
500.0000 [IU] | Freq: Once | INTRAVENOUS | Status: AC
  Administered 2022-09-11: 500 [IU]

## 2022-09-29 ENCOUNTER — Telehealth: Payer: Self-pay

## 2022-09-29 NOTE — Telephone Encounter (Signed)
Returned her call. Given billing #, she has questions regarding a letter she received from Medicare.

## 2022-09-30 ENCOUNTER — Telehealth: Payer: Self-pay

## 2022-09-30 NOTE — Telephone Encounter (Signed)
Returned her call. She is concerned about August CT being approved. Told her the CT is approved. She verbalized understanding.

## 2022-10-07 DIAGNOSIS — Z1211 Encounter for screening for malignant neoplasm of colon: Secondary | ICD-10-CM | POA: Diagnosis not present

## 2022-10-07 DIAGNOSIS — C482 Malignant neoplasm of peritoneum, unspecified: Secondary | ICD-10-CM | POA: Diagnosis not present

## 2022-10-07 DIAGNOSIS — I499 Cardiac arrhythmia, unspecified: Secondary | ICD-10-CM | POA: Diagnosis not present

## 2022-10-07 DIAGNOSIS — M797 Fibromyalgia: Secondary | ICD-10-CM | POA: Diagnosis not present

## 2022-10-07 DIAGNOSIS — F4323 Adjustment disorder with mixed anxiety and depressed mood: Secondary | ICD-10-CM | POA: Diagnosis not present

## 2022-10-07 DIAGNOSIS — Z7185 Encounter for immunization safety counseling: Secondary | ICD-10-CM | POA: Diagnosis not present

## 2022-10-07 DIAGNOSIS — G63 Polyneuropathy in diseases classified elsewhere: Secondary | ICD-10-CM | POA: Diagnosis not present

## 2022-10-07 DIAGNOSIS — M810 Age-related osteoporosis without current pathological fracture: Secondary | ICD-10-CM | POA: Diagnosis not present

## 2022-10-07 DIAGNOSIS — T148XXA Other injury of unspecified body region, initial encounter: Secondary | ICD-10-CM | POA: Diagnosis not present

## 2022-10-07 DIAGNOSIS — R569 Unspecified convulsions: Secondary | ICD-10-CM | POA: Diagnosis not present

## 2022-11-06 ENCOUNTER — Ambulatory Visit (HOSPITAL_COMMUNITY)
Admission: RE | Admit: 2022-11-06 | Discharge: 2022-11-06 | Disposition: A | Discharge status: Home / Self Care | Payer: Medicare Other | Source: Ambulatory Visit | Attending: Hematology and Oncology | Admitting: Hematology and Oncology

## 2022-11-06 ENCOUNTER — Inpatient Hospital Stay: Payer: Medicare Other | Attending: Gynecologic Oncology

## 2022-11-06 DIAGNOSIS — C482 Malignant neoplasm of peritoneum, unspecified: Secondary | ICD-10-CM | POA: Insufficient documentation

## 2022-11-06 DIAGNOSIS — I700 Atherosclerosis of aorta: Secondary | ICD-10-CM | POA: Diagnosis not present

## 2022-11-06 DIAGNOSIS — R918 Other nonspecific abnormal finding of lung field: Secondary | ICD-10-CM | POA: Diagnosis not present

## 2022-11-06 DIAGNOSIS — C569 Malignant neoplasm of unspecified ovary: Secondary | ICD-10-CM | POA: Diagnosis not present

## 2022-11-06 LAB — CMP (CANCER CENTER ONLY)
ALT: 25 U/L (ref 0–44)
AST: 33 U/L (ref 15–41)
Albumin: 4.3 g/dL (ref 3.5–5.0)
Alkaline Phosphatase: 64 U/L (ref 38–126)
Anion gap: 11 (ref 5–15)
BUN: 8 mg/dL (ref 8–23)
CO2: 26 mmol/L (ref 22–32)
Calcium: 9.4 mg/dL (ref 8.9–10.3)
Chloride: 100 mmol/L (ref 98–111)
Creatinine: 0.57 mg/dL (ref 0.44–1.00)
GFR, Estimated: >60 mL/min (ref >60)
Glucose, Bld: 93 mg/dL (ref 70–99)
Potassium: 3.7 mmol/L (ref 3.5–5.1)
Sodium: 137 mmol/L (ref 135–145)
Total Bilirubin: 0.5 mg/dL (ref 0.3–1.2)
Total Protein: 7.3 g/dL (ref 6.5–8.1)

## 2022-11-06 LAB — CBC WITH DIFFERENTIAL/PLATELET
Abs Immature Granulocytes: 0.01 10*3/uL (ref 0.00–0.07)
Basophils Absolute: 0 10*3/uL (ref 0.0–0.1)
Basophils Relative: 1 %
Eosinophils Absolute: 0 10*3/uL (ref 0.0–0.5)
Eosinophils Relative: 1 %
HCT: 34.2 % — ABNORMAL LOW (ref 36.0–46.0)
Hemoglobin: 11.7 g/dL — ABNORMAL LOW (ref 12.0–15.0)
Immature Granulocytes: 0 %
Lymphocytes Relative: 26 %
Lymphs Abs: 1.4 10*3/uL (ref 0.7–4.0)
MCH: 30.6 pg (ref 26.0–34.0)
MCHC: 34.2 g/dL (ref 30.0–36.0)
MCV: 89.5 fL (ref 80.0–100.0)
Monocytes Absolute: 0.3 10*3/uL (ref 0.1–1.0)
Monocytes Relative: 6 %
Neutro Abs: 3.5 10*3/uL (ref 1.7–7.7)
Neutrophils Relative %: 66 %
Platelets: 219 10*3/uL (ref 150–400)
RBC: 3.82 MIL/uL — ABNORMAL LOW (ref 3.87–5.11)
RDW: 12.7 % (ref 11.5–15.5)
WBC: 5.3 10*3/uL (ref 4.0–10.5)
nRBC: 0 % (ref 0.0–0.2)

## 2022-11-06 MED ORDER — SODIUM CHLORIDE 0.9% FLUSH
10.0000 mL | Freq: Once | INTRAVENOUS | Status: AC
  Administered 2022-11-06: 10 mL

## 2022-11-06 MED ORDER — HEPARIN SOD (PORK) LOCK FLUSH 100 UNIT/ML IV SOLN
INTRAVENOUS | Status: AC
  Filled 2022-11-06: qty 5

## 2022-11-06 MED ORDER — HEPARIN SOD (PORK) LOCK FLUSH 100 UNIT/ML IV SOLN
500.0000 [IU] | Freq: Once | INTRAVENOUS | Status: AC
  Administered 2022-11-06: 500 [IU] via INTRAVENOUS

## 2022-11-06 MED ORDER — IOHEXOL 9 MG/ML PO SOLN
1000.0000 mL | Freq: Once | ORAL | Status: AC
  Administered 2022-11-06: 1000 mL via ORAL

## 2022-11-06 MED ORDER — SODIUM CHLORIDE (PF) 0.9 % IJ SOLN
INTRAMUSCULAR | Status: AC
  Filled 2022-11-06: qty 50

## 2022-11-06 MED ORDER — IOHEXOL 300 MG/ML  SOLN
100.0000 mL | Freq: Once | INTRAMUSCULAR | Status: AC | PRN
  Administered 2022-11-06: 80 mL via INTRAVENOUS

## 2022-11-08 LAB — CA 125: Cancer Antigen (CA) 125: 8.2 U/mL (ref 0.0–38.1)

## 2022-11-11 ENCOUNTER — Encounter: Payer: Self-pay | Admitting: Hematology and Oncology

## 2022-11-11 ENCOUNTER — Inpatient Hospital Stay: Payer: Medicare Other | Admitting: Hematology and Oncology

## 2022-11-11 VITALS — BP 127/52 | HR 81 | Temp 98.4°F | Resp 18 | Ht 65.0 in | Wt 97.4 lb

## 2022-11-11 DIAGNOSIS — D649 Anemia, unspecified: Secondary | ICD-10-CM | POA: Diagnosis not present

## 2022-11-11 DIAGNOSIS — C482 Malignant neoplasm of peritoneum, unspecified: Secondary | ICD-10-CM

## 2022-11-11 DIAGNOSIS — R911 Solitary pulmonary nodule: Secondary | ICD-10-CM

## 2022-11-11 NOTE — Progress Notes (Signed)
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[facility]) I have reviewed multiple imaging studies with the patient She has no evidence of cancer recurrence Her last chemotherapy treatment was more than a year ago I will space out her future imaging to every 4 months She will continue port flushes for maintenance  Lesion of lung She has lung collapse noted on her prior imaging dated back to 2020 I reviewed multiple chest imaging with the patient and reassured her  Mild chronic anemia She has mild chronic stable anemia, could be related to dietary deficiency and others She is not symptomatic We discussed workup required in the patient is not keen to have additional labs right now We discussed importance of increase dietary iron intake for now  Orders Placed This Encounter  Procedures   CT CHEST ABDOMEN PELVIS W CONTRAST    Standing Status:   Future    Standing Expiration Date:   11/11/2023    Order Specific Question:   If indicated for the ordered procedure, I authorize the administration of contrast media per Radiology protocol    Answer:   Yes    Order Specific Question:   Does the patient have a contrast media/X-ray dye allergy?    Answer:   No    Order Specific Question:   Preferred imaging location?    Answer:   [facility]    Order Specific Question:   If indicated for the ordered procedure, I authorize the administration of oral contrast media per Radiology protocol    Answer:   Yes    All questions were answered. The patient knows to call the clinic with any problems, questions or concerns. The total time spent in the appointment was 40 minutes encounter with patients including review of chart and various tests results, discussions about plan of care and coordination of care plan   Artis Delay, MD 11/11/2022 1:43 PM  INTERVAL HISTORY: Please see below for problem oriented charting. she returns for surveillance follow-up and results of her recent imaging She is not symptomatic She continues to complain of chronic fatigue She has mild intermittent vague abdominal discomfort We spent a lot of time reviewing multiple imaging studies  REVIEW OF SYSTEMS:   Constitutional: Denies fevers, chills or abnormal weight loss Eyes: Denies blurriness of vision Ears, nose, mouth, throat, and face: Denies mucositis or sore throat Respiratory: Denies cough, dyspnea or wheezes Cardiovascular: Denies palpitation, chest discomfort or lower extremity swelling Gastrointestinal:  Denies nausea, heartburn or change in bowel habits Skin: Denies abnormal skin rashes Lymphatics: Denies new lymphadenopathy or easy bruising Neurological:Denies numbness, tingling or new weaknesses Behavioral/Psych: Mood is stable, no new changes  All other systems were reviewed with the patient and are negative.  I have reviewed the past medical history, past surgical history, social history and family history with the patient and they are unchanged from previous note.  ALLERGIES:  is allergic to carboplatin, fentanyl, vancomycin, milk (cow), brimonidine tartrate, brimonidine tartrate, ditropan [oxybutynin], milk-related compounds, other, oxybutynin chloride, thimerosal (thiomersal), tramadol, avelox [moxifloxacin hcl in nacl], doxycycline, erythromycin, metronidazole, minocycline, penicillins, quinolones, and sulfonamide derivatives.  MEDICATIONS:  Current Outpatient Medications  Medication Sig Dispense Refill   Azelaic Acid 15 % cream Apply 1 application topically daily.     cholecalciferol (VITAMIN D3) 25 MCG (1000 UNIT) tablet Take  2,000 Units by mouth daily. (Patient not taking: Reported on 08/04/2022)     gabapentin (NEURONTIN) 300 MG capsule Take 300 mg by mouth 3 (three) times daily.     levETIRAcetam  (KEPPRA) 500 MG tablet TAKE 2 TABLETS(1000 MG) BY MOUTH TWICE DAILY 120 tablet 3   lidocaine-prilocaine (EMLA) cream Apply to affected area(s) as needed. (Patient not taking: Reported on 08/04/2022) 30 g 3   LORazepam (ATIVAN) 2 MG tablet Take 1 tablet (2 mg total) by mouth every 6 (six) hours as needed for seizure. (Patient not taking: Reported on 08/04/2022) 10 tablet 0   Magnesium 250 MG TABS Take 250 mg by mouth daily as needed. (Patient not taking: Reported on 08/04/2022)     Sulfacetamide Sodium, Acne, 10 % LOTN Apply 1 application topically at bedtime.     No current facility-administered medications for this visit.    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 Findings highly suspicious for peritoneal carcinomatosis. Recommend paracentesis for therapeutic and diagnostic purposes. I do not see an obvious primary lesion but there is some irregular enhancing soft tissue in the right adnexal area. CA 125 level may be helpful. 2. Mild surface irregularity involving the liver but I do not see any obvious changes of cirrhosis. No hepatic lesions.   11/23/2018 Tumor Marker   Patient's tumor was tested for the following markers: CA-125 Results of the tumor marker test revealed 253.   11/25/2018 Initial Diagnosis   Peritoneal carcinoma (HCC)   11/25/2018 Imaging   US paracentesis Successful ultrasound-guided paracentesis yielding 3.4 L of peritoneal fluid.   12/01/2018 Cancer Staging   Staging form: Ovary, Fallopian Tube, and Primary Peritoneal Carcinoma, AJCC 8th Edition - Clinical stage from 12/01/2018: Stage IVA (rcT3, cN0, pM1a) - Signed by Artis Delay, MD on 02/14/2021 Stage prefix: Recurrence   12/02/2018 Tumor Marker   Patient's tumor was tested for the following markers:  CA-125 Results of the tumor marker test revealed 267   12/06/2018 - 05/16/2019 Chemotherapy   The patient had carboplatin and taxol for chemotherapy treatment.     12/08/2018 Procedure   Successful ultrasound-guided therapeutic paracentesis yielding 1.7 liters of peritoneal fluid.   12/24/2018 Procedure   Successful placement of a right IJ approach Power Port with ultrasound and fluoroscopic guidance. The catheter is ready for use   01/17/2019 Tumor Marker   Patient's tumor was tested for the following markers: CA-125 Results of the tumor marker test revealed 57.2   01/31/2019 Imaging   Significant decrease in peritoneal carcinomatosis since previous study. Interval resolution of ascites.   Focal area of parenchymal consolidation in central right middle lobe, which measures 3 cm. Differential diagnosis includes infectious or inflammatory process, atelectasis, and neoplasm. Recommend short-term follow-up by chest CT in 2-3 months.     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   Surgeon: Quinn Axe    Operative Findings:  : grossly normal uterus, ovaries normal, few scattered peritoneal nodules (1mm) on serosa of uterus and tubes. The omentum (gastrocolic) was tethered to the mesentery with tumor (thin rind). Complete (optimal) resection of tumor with no gross  residual disease.     02/17/2019 Pathology Results   FINAL MICROSCOPIC DIAGNOSIS: A. UTERUS, CERVIX, BILATERAL FALLOPIAN TUBES AND OVARIES, HYSTERECTOMY WITH SALPINGOOOPHORECTOMY: - Uterus: Endometrium: Inactive endometrium. No hyperplasia or malignancy. Myometrium: Unremarkable. No malignancy. Serosa: Metastatic carcinoma. No malignancy. - Cervix: Benign squamous and  endocervical mucosa. No dysplasia or malignancy. - Left ovary and fallopian tube: Metastatic carcinoma. - Right ovary: No malignancy identified. - Right fallopian tube: Luminal tumor, see comment. B. OMENTUM, RESECTION: - High grade serous carcinoma. - Deposits up to at least 3 cm. - See oncology table. ONCOLOGY TABLE: OVARY or FALLOPIAN TUBE or PRIMARY PERITONEUM: Procedure: Hysterectomy with bilateral salpingo-oophorectomy and omentectomy. Specimen Integrity: Intact Tumor Site: Peritoneum Ovarian Surface Involvement (required only if applicable): Left ovary Fallopian Tube Surface Involvement (required only if applicable): Left Fallopian tube Tumor Size: Largest deposit 3 cm Histologic Type: High-grade serous carcinoma Histologic Grade: High-grade Implants (required for advanced stage serous/seromucinous borderline tumors only): Uterine serosa, left fallopian tube and ovary, omentum Other Tissue/ Organ Involvement: As above Largest Extrapelvic Peritoneal Focus (required only if applicable): 3 cm Peritoneal/Ascitic Fluid: Pre neoadjuvant [id number] positive for carcinoma. Treatment Effect (required only for high-grade serous carcinomas): Probably partial treatment effect in omental tissue. Regional Lymph Nodes: No lymph nodes submitted or found Pathologic Stage Classification (pTNM, AJCC 8th Edition): ypT3c, ypNX Representative Tumor Block: B5 Comment(s): There is surface involvement of the left ovary and fallopian tube but no primary tumor. Within the right fallopian tube lumen there is detached fragments of tumor but again no precursor lesion is noted in the right fallopian tube. Thus, the tumor is presumed primary peritoneal and staged as such.   03/21/2019 Tumor Marker   Patient's tumor was tested for the following markers: CA-125 Results of the tumor marker test revealed 15.1    Genetic Testing   Negative genetic testing. No pathogenic variants identified on the [employer] CancerNext +  RNAinsight panel. VUS in ATM called [id number] identified. The report date is 05/12/2019. TumorNext HRD was originally ordered but there was not enough sample to complete this testing.   The CancerNext+RNAinsight gene panel offered by [employer] includes sequencing and rearrangement analysis for the following 36 genes: APC*, ATM*, AXIN2, BARD1, BMPR1A, BRCA1*, BRCA2*, BRIP1*, CDH1*, CDK4, CDKN2A, CHEK2*, DICER1, MLH1*, MSH2*, MSH3, MSH6*, MUTYH*, NBN, NF1*, NTHL1, PALB2*, PMS2*, PTEN*, RAD51C*, RAD51D*, RECQL, SMAD4, SMARCA4, STK11 and TP53* (sequencing and deletion/duplication); HOXB13, POLD1 and POLE (sequencing only); EPCAM and GREM1 (deletion/duplication only). DNA and RNA analyses performed for * genes.    05/16/2019 Tumor Marker   Patient's tumor was tested for the following markers: CA-125 Results of the tumor marker test revealed 10.7   06/16/2019 Imaging   1. No evidence of residual or recurrent metastatic disease in the chest, abdomen or pelvis. 2. Masslike focus of consolidation in the right middle lobe along the major and minor fissures with associated volume loss is stable since 01/31/2019. Indolent primary pulmonary neoplasm not excluded. PET-CT may be considered for further characterization. 3. Aortic Atherosclerosis (ICD10-I70.0).   06/16/2019 Tumor Marker   Patient's tumor was tested for the following markers: CA-125 Results of the tumor marker test revealed 9.1.   12/05/2019 Tumor Marker   Patient's tumor was tested for the following markers: CA-125. Results of the tumor marker test revealed 9.0.   02/29/2020 Tumor Marker   Patient's tumor was tested for the following markers: CA-`125 Results of the tumor marker test revealed 9.7.   05/28/2020 Tumor Marker   Patient's tumor was tested for the  following markers: CA-125 Results of the tumor marker test revealed 9.5   08/16/2020 Tumor Marker   Patient's tumor was tested for the following markers: CA-125 Results of the tumor  marker test revealed 10.9   11/29/2020 Tumor Marker   Patient's tumor was tested for the following markers: CA-125. Results of the tumor marker test revealed 10.2.   01/22/2021 Pathology Results   SURGICAL PATHOLOGY  CASE: [phone]  PATIENT: Concha Se  Surgical Pathology Report   Clinical History: brain metastasis (cm)    FINAL MICROSCOPIC DIAGNOSIS:   A. 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    03/20/2021 Imaging   1. Unchanged enlarged midline superior mediastinal and left retroperitoneal lymph nodes, which remain suspicious for metastatic disease. 2. Unchanged masslike consolidation and volume loss of the right middle lobe measuring 3.8 x 1.9 cm, likely chronic scarring. Continued attention on follow-up. 3. No new evidence of metastatic disease in the chest, abdomen, or pelvis. 4. Status post hysterectomy and omentectomy.   Aortic Atherosclerosis (ICD10-I70.0).     03/21/2021 - 05/07/2021 Chemotherapy   Patient is on Treatment Plan : OVARIAN RECURRENT 3RD LINE Carboplatin D1 / Gemcitabine D1,8 (4/800) q21d     05/17/2021 Imaging   1. No new suspicious mass or lymphadenopathy identified in the chest, abdomen or pelvis. 2. Interval decreased size of previous enlarged superior mediastinal and left retroperitoneal lymph nodes. 3. Other ancillary findings as described.   05/21/2021 - 08/13/2021 Chemotherapy   Patient is on Treatment Plan : OVARIAN Gemcitabine D1,8 q21d      08/16/2021 Imaging   MRI brain  1. Regression of disease. Resolved marginal enhancement at the right middle frontal gyrus resection cavity and regressed enhancement at the treated left parietal lesion.   2. No new metastatic disease or acute intracranial abnormality.   08/19/2021 Imaging   Slight decrease in size of small left retroperitoneal and superior mediastinal  lymph nodes.   No new or progressive disease within the chest, abdomen, or pelvis.   Aortic Atherosclerosis (ICD10-I70.0).     11/29/2021 Imaging   1. Slightly decreased size of the small left retroperitoneal lymph node with stable superior mediastinal lymph node. 2. No new or progressive disease within the chest, abdomen or pelvis. 3.  Aortic Atherosclerosis (ICD10-I70.0).     03/27/2022 Imaging   1. No current findings of active malignancy. 2. Stable chronic atelectasis of the right middle lobe. 3. Mild aortoiliac atherosclerotic vascular calcification. 4. Degenerative subcortical cystic lesion along the left acetabulum. 5. Old healed left posterior sixth and seventh rib fractures. 6. Aortic atherosclerosis.   07/22/2022 Imaging   CT CHEST ABDOMEN PELVIS W CONTRAST  Result Date: 07/21/2022 CLINICAL DATA:  Restaging ovarian cancer.  * Tracking Code: BO *. EXAM: CT CHEST, ABDOMEN, AND PELVIS WITH CONTRAST TECHNIQUE: Multidetector CT imaging of the chest, abdomen and pelvis was performed following the standard protocol during bolus administration of intravenous contrast. RADIATION DOSE REDUCTION: This exam was performed according to the departmental dose-optimization program which includes automated exposure control, adjustment of the mA and/or kV according to patient size and/or use of iterative reconstruction technique. CONTRAST:  80mL OMNIPAQUE IOHEXOL 300 MG/ML  SOLN COMPARISON:  03/25/2022 FINDINGS: CT CHEST FINDINGS Cardiovascular: Mild cardiac enlargement. No pericardial effusion. Aortic atherosclerotic calcifications. Mediastinum/Nodes: Thyroid gland, trachea, and esophagus are unremarkable. No enlarged mediastinal or hilar lymph nodes. No axillary adenopathy. Lungs/Pleura:  No pleural fluid. Stable appearance of atelectasis involving the right middle lobe. No suspicious pulmonary nodules identified. Musculoskeletal: T3 vertebral hemangioma. No aggressive or acute osseous findings. Remote  left posterior rib fractures. CT ABDOMEN PELVIS FINDINGS Hepatobiliary: No focal liver abnormality is seen. No gallstones, gallbladder wall thickening, or biliary dilatation. Pancreas: Unremarkable. No pancreatic ductal dilatation or surrounding inflammatory changes. Spleen: Normal in size without focal abnormality. Adrenals/Urinary Tract: Normal adrenal glands. No kidney mass or hydronephrosis identified. Urinary bladder is unremarkable. Stomach/Bowel: Stomach appears normal. No pathologic dilatation of the large or small bowel loops. No bowel wall thickening or inflammation. Vascular/Lymphatic: Aortic atherosclerosis. No aneurysm. New soft tissue nodule or lymph node along the gastrohepatic ligament measures 0.7 cm, image 61/2. Adjacent nodule is also new measuring 0.8 cm, image 60/2. The previously referenced right gastric lymph node measuring 8 mm is unchanged, image 66/3. No enlarged pelvic or inguinal lymph nodes. Reproductive: Status post hysterectomy. No adnexal masses. Other: Status post prior omentectomy. No ascites. No discrete fluid collections identified. Musculoskeletal: No acute or significant osseous findings. IMPRESSION: 1. New soft tissue nodules or lymph nodes along the gastrohepatic ligament are identified measuring up to 0.8 cm. Recurrent peritoneal disease or nodal disease cannot be excluded. 2. Stable appearance of previously referenced right gastric lymph node measuring 8 mm. 3. No signs of metastatic disease to the chest. 4.  Aortic Atherosclerosis (ICD10-I70.0). Electronically Signed   By: Signa Kell M.D.   On: 07/21/2022 09:47      11/09/2022 Tumor Marker   Patient's tumor was tested for the following markers: CA-125. Results of the tumor marker test revealed 8.2.   11/11/2022 Imaging   CT CHEST ABDOMEN PELVIS W CONTRAST  Result Date: 11/10/2022 CLINICAL DATA:  Ovarian cancer restaging.  * Tracking Code: BO *. EXAM: CT CHEST, ABDOMEN, AND PELVIS WITH CONTRAST TECHNIQUE:  Multidetector CT imaging of the chest, abdomen and pelvis was performed following the standard protocol during bolus administration of intravenous contrast. RADIATION DOSE REDUCTION: This exam was performed according to the departmental dose-optimization program which includes automated exposure control, adjustment of the mA and/or kV according to patient size and/or use of iterative reconstruction technique. CONTRAST:  80mL OMNIPAQUE IOHEXOL 300 MG/ML  SOLN COMPARISON:  None Available. FINDINGS: CT CHEST FINDINGS Cardiovascular: The heart size is normal. No substantial pericardial effusion. Right Port-A-Cath tip is positioned at the SVC/RA junction. Mediastinum/Nodes: No mediastinal lymphadenopathy. There is no hilar lymphadenopathy. The esophagus has normal imaging features. There is no axillary lymphadenopathy. Lungs/Pleura: Biapical pleuroparenchymal scarring evident. Right middle lobe collapse/consolidation is stable. No new suspicious pulmonary nodule or mass. Musculoskeletal: No worrisome lytic or sclerotic osseous abnormality. CT ABDOMEN PELVIS FINDINGS Hepatobiliary: No suspicious focal abnormality within the liver parenchyma. There is no evidence for gallstones, gallbladder wall thickening, or pericholecystic fluid. No intrahepatic or extrahepatic biliary dilation. Pancreas: No focal mass lesion. No dilatation of the main duct. No intraparenchymal cyst. No peripancreatic edema. Spleen: No splenomegaly. No suspicious focal mass lesion. Adrenals/Urinary Tract: No adrenal nodule or mass. Kidneys unremarkable. No evidence for hydroureter. The urinary bladder appears normal for the degree of distention. Stomach/Bowel: Stomach is unremarkable. No gastric wall thickening. No evidence of outlet obstruction. Duodenum is normally positioned as is the ligament of Treitz. No small bowel wall thickening. No small bowel dilatation. The terminal ileum is normal. The appendix is normal. No gross colonic mass. No colonic  wall thickening. Vascular/Lymphatic: There is mild atherosclerotic calcification of the abdominal aorta without aneurysm. 8 mm short axis lymph  nodes in the gastrohepatic ligament (images 59 in 60 of series 2 are stable in the interval. Small left para-aortic lymph nodes are stable. No retroperitoneal or mesenteric lymphadenopathy. No pelvic sidewall lymphadenopathy. Reproductive: Hysterectomy.  There is no adnexal mass. Other: No intraperitoneal free fluid. Musculoskeletal: No worrisome lytic or sclerotic osseous abnormality. IMPRESSION: 1. Stable exam. No new or progressive findings to suggest recurrent or metastatic disease in the chest, abdomen, or pelvis. 2. Small lymph nodes in the gastrohepatic ligament identified previously are stable in the interval. Continued attention on follow-up recommended. 3. Stable right middle lobe collapse/consolidation. 4.  Aortic Atherosclerosis (ICD10-I70.0). Electronically Signed   By: Kennith Center M.D.   On: 11/10/2022 14:58        PHYSICAL EXAMINATION: ECOG PERFORMANCE STATUS: 1 - Symptomatic but completely ambulatory  Vitals:   11/11/22 0836  BP: (!) 127/52  Pulse: 81  Resp: 18  Temp: 98.4 F (36.9 C)  SpO2: 100%   Filed Weights   11/11/22 0836  Weight: 97 lb 6.4 oz (44.2 kg)    GENERAL:alert, no distress and comfortable NEURO: alert & oriented x 3 with fluent speech, no focal motor/sensory deficits  LABORATORY DATA:  I have reviewed the data as listed    Component Value Date/Time   NA 137 11/06/2022 0741   K 3.7 11/06/2022 0741   CL 100 11/06/2022 0741   CO2 26 11/06/2022 0741   GLUCOSE 93 11/06/2022 0741   BUN 8 11/06/2022 0741   CREATININE 0.57 11/06/2022 0741   CALCIUM 9.4 11/06/2022 0741   PROT 7.3 11/06/2022 0741   ALBUMIN 4.3 11/06/2022 0741   AST 33 11/06/2022 0741   ALT 25 11/06/2022 0741   ALKPHOS 64 11/06/2022 0741   BILITOT 0.5 11/06/2022 0741   GFRNONAA >60 11/06/2022 0741   GFRAA >60 12/05/2019 1145    No results  found for: "SPEP", "UPEP"  Lab Results  Component Value Date   WBC 5.3 11/06/2022   NEUTROABS 3.5 11/06/2022   HGB 11.7 (L) 11/06/2022   HCT 34.2 (L) 11/06/2022   MCV 89.5 11/06/2022   PLT 219 11/06/2022      Chemistry      Component Value Date/Time   NA 137 11/06/2022 0741   K 3.7 11/06/2022 0741   CL 100 11/06/2022 0741   CO2 26 11/06/2022 0741   BUN 8 11/06/2022 0741   CREATININE 0.57 11/06/2022 0741      Component Value Date/Time   CALCIUM 9.4 11/06/2022 0741   ALKPHOS 64 11/06/2022 0741   AST 33 11/06/2022 0741   ALT 25 11/06/2022 0741   BILITOT 0.5 11/06/2022 0741       RADIOGRAPHIC STUDIES: I have reviewed multiple imaging studies with the patient I have personally reviewed the radiological images as listed and agreed with the findings in the report. CT CHEST ABDOMEN PELVIS W CONTRAST  Result Date: 11/10/2022 CLINICAL DATA:  Ovarian cancer restaging.  * Tracking Code: BO *. EXAM: CT CHEST, ABDOMEN, AND PELVIS WITH CONTRAST TECHNIQUE: Multidetector CT imaging of the chest, abdomen and pelvis was performed following the standard protocol during bolus administration of intravenous contrast. RADIATION DOSE REDUCTION: This exam was performed according to the departmental dose-optimization program which includes automated exposure control, adjustment of the mA and/or kV according to patient size and/or use of iterative reconstruction technique. CONTRAST:  80mL OMNIPAQUE IOHEXOL 300 MG/ML  SOLN COMPARISON:  None Available. FINDINGS: CT CHEST FINDINGS Cardiovascular: The heart size is normal. No substantial pericardial effusion. Right Port-A-Cath tip  is positioned at the SVC/RA junction. Mediastinum/Nodes: No mediastinal lymphadenopathy. There is no hilar lymphadenopathy. The esophagus has normal imaging features. There is no axillary lymphadenopathy. Lungs/Pleura: Biapical pleuroparenchymal scarring evident. Right middle lobe collapse/consolidation is stable. No new suspicious  pulmonary nodule or mass. Musculoskeletal: No worrisome lytic or sclerotic osseous abnormality. CT ABDOMEN PELVIS FINDINGS Hepatobiliary: No suspicious focal abnormality within the liver parenchyma. There is no evidence for gallstones, gallbladder wall thickening, or pericholecystic fluid. No intrahepatic or extrahepatic biliary dilation. Pancreas: No focal mass lesion. No dilatation of the main duct. No intraparenchymal cyst. No peripancreatic edema. Spleen: No splenomegaly. No suspicious focal mass lesion. Adrenals/Urinary Tract: No adrenal nodule or mass. Kidneys unremarkable. No evidence for hydroureter. The urinary bladder appears normal for the degree of distention. Stomach/Bowel: Stomach is unremarkable. No gastric wall thickening. No evidence of outlet obstruction. Duodenum is normally positioned as is the ligament of Treitz. No small bowel wall thickening. No small bowel dilatation. The terminal ileum is normal. The appendix is normal. No gross colonic mass. No colonic wall thickening. Vascular/Lymphatic: There is mild atherosclerotic calcification of the abdominal aorta without aneurysm. 8 mm short axis lymph nodes in the gastrohepatic ligament (images 59 in 60 of series 2 are stable in the interval. Small left para-aortic lymph nodes are stable. No retroperitoneal or mesenteric lymphadenopathy. No pelvic sidewall lymphadenopathy. Reproductive: Hysterectomy.  There is no adnexal mass. Other: No intraperitoneal free fluid. Musculoskeletal: No worrisome lytic or sclerotic osseous abnormality. IMPRESSION: 1. Stable exam. No new or progressive findings to suggest recurrent or metastatic disease in the chest, abdomen, or pelvis. 2. Small lymph nodes in the gastrohepatic ligament identified previously are stable in the interval. Continued attention on follow-up recommended. 3. Stable right middle lobe collapse/consolidation. 4.  Aortic Atherosclerosis (ICD10-I70.0). Electronically Signed   By: Kennith Center M.D.    On: 11/10/2022 14:58

## 2022-11-11 NOTE — Assessment & Plan Note (Signed)
I have reviewed multiple imaging studies with the patient She has no evidence of cancer recurrence Her last chemotherapy treatment was more than a year ago I will space out her future imaging to every 4 months She will continue port flushes for maintenance

## 2022-11-11 NOTE — Assessment & Plan Note (Signed)
She has lung collapse noted on her prior imaging dated back to 2020 I reviewed multiple chest imaging with the patient and reassured her

## 2022-11-11 NOTE — Assessment & Plan Note (Signed)
She has mild chronic stable anemia, could be related to dietary deficiency and others She is not symptomatic We discussed workup required in the patient is not keen to have additional labs right now We discussed importance of increase dietary iron intake for now

## 2022-11-19 DIAGNOSIS — Z23 Encounter for immunization: Secondary | ICD-10-CM | POA: Diagnosis not present

## 2022-12-15 DIAGNOSIS — Z23 Encounter for immunization: Secondary | ICD-10-CM | POA: Diagnosis not present

## 2022-12-18 DIAGNOSIS — L718 Other rosacea: Secondary | ICD-10-CM | POA: Diagnosis not present

## 2022-12-18 DIAGNOSIS — D225 Melanocytic nevi of trunk: Secondary | ICD-10-CM | POA: Diagnosis not present

## 2022-12-18 DIAGNOSIS — D692 Other nonthrombocytopenic purpura: Secondary | ICD-10-CM | POA: Diagnosis not present

## 2022-12-18 DIAGNOSIS — D2239 Melanocytic nevi of other parts of face: Secondary | ICD-10-CM | POA: Diagnosis not present

## 2022-12-18 DIAGNOSIS — L720 Epidermal cyst: Secondary | ICD-10-CM | POA: Diagnosis not present

## 2022-12-18 DIAGNOSIS — L821 Other seborrheic keratosis: Secondary | ICD-10-CM | POA: Diagnosis not present

## 2022-12-18 DIAGNOSIS — L814 Other melanin hyperpigmentation: Secondary | ICD-10-CM | POA: Diagnosis not present

## 2022-12-18 DIAGNOSIS — D1801 Hemangioma of skin and subcutaneous tissue: Secondary | ICD-10-CM | POA: Diagnosis not present

## 2023-01-06 ENCOUNTER — Other Ambulatory Visit: Payer: Self-pay | Admitting: Internal Medicine

## 2023-01-07 ENCOUNTER — Inpatient Hospital Stay: Payer: Medicare Other | Attending: Gynecologic Oncology

## 2023-01-07 ENCOUNTER — Encounter: Payer: Self-pay | Admitting: Hematology and Oncology

## 2023-01-07 ENCOUNTER — Other Ambulatory Visit: Payer: Self-pay

## 2023-01-07 DIAGNOSIS — C482 Malignant neoplasm of peritoneum, unspecified: Secondary | ICD-10-CM | POA: Insufficient documentation

## 2023-01-07 DIAGNOSIS — Z452 Encounter for adjustment and management of vascular access device: Secondary | ICD-10-CM | POA: Insufficient documentation

## 2023-01-07 MED ORDER — SODIUM CHLORIDE 0.9% FLUSH
10.0000 mL | Freq: Once | INTRAVENOUS | Status: AC
  Administered 2023-01-07: 10 mL

## 2023-01-07 MED ORDER — HEPARIN SOD (PORK) LOCK FLUSH 100 UNIT/ML IV SOLN
500.0000 [IU] | Freq: Once | INTRAVENOUS | Status: AC
  Administered 2023-01-07: 500 [IU]

## 2023-01-21 DIAGNOSIS — Z1231 Encounter for screening mammogram for malignant neoplasm of breast: Secondary | ICD-10-CM | POA: Diagnosis not present

## 2023-01-21 DIAGNOSIS — M8588 Other specified disorders of bone density and structure, other site: Secondary | ICD-10-CM | POA: Diagnosis not present

## 2023-01-21 DIAGNOSIS — Z853 Personal history of malignant neoplasm of breast: Secondary | ICD-10-CM | POA: Diagnosis not present

## 2023-01-21 DIAGNOSIS — Z7952 Long term (current) use of systemic steroids: Secondary | ICD-10-CM | POA: Diagnosis not present

## 2023-01-27 ENCOUNTER — Encounter: Payer: Self-pay | Admitting: Hematology and Oncology

## 2023-01-27 DIAGNOSIS — H40033 Anatomical narrow angle, bilateral: Secondary | ICD-10-CM | POA: Diagnosis not present

## 2023-01-27 DIAGNOSIS — H401421 Capsular glaucoma with pseudoexfoliation of lens, left eye, mild stage: Secondary | ICD-10-CM | POA: Diagnosis not present

## 2023-01-27 DIAGNOSIS — H43813 Vitreous degeneration, bilateral: Secondary | ICD-10-CM | POA: Diagnosis not present

## 2023-01-27 DIAGNOSIS — H2513 Age-related nuclear cataract, bilateral: Secondary | ICD-10-CM | POA: Diagnosis not present

## 2023-01-29 ENCOUNTER — Ambulatory Visit
Admission: RE | Admit: 2023-01-29 | Discharge: 2023-01-29 | Disposition: A | Discharge status: Home / Self Care | Payer: Medicare Other | Source: Ambulatory Visit | Attending: Internal Medicine | Admitting: Internal Medicine

## 2023-01-29 DIAGNOSIS — C729 Malignant neoplasm of central nervous system, unspecified: Secondary | ICD-10-CM | POA: Diagnosis not present

## 2023-01-29 DIAGNOSIS — C7931 Secondary malignant neoplasm of brain: Secondary | ICD-10-CM

## 2023-01-29 MED ORDER — GADOPICLENOL 0.5 MMOL/ML IV SOLN
5.0000 mL | Freq: Once | INTRAVENOUS | Status: AC | PRN
Start: 2023-01-29 — End: 2023-01-29
  Administered 2023-01-29: 5 mL via INTRAVENOUS

## 2023-02-02 ENCOUNTER — Other Ambulatory Visit (HOSPITAL_COMMUNITY): Payer: Self-pay

## 2023-02-02 ENCOUNTER — Other Ambulatory Visit: Payer: Self-pay | Admitting: Hematology and Oncology

## 2023-02-02 ENCOUNTER — Encounter: Payer: Self-pay | Admitting: Hematology and Oncology

## 2023-02-02 MED ORDER — LIDOCAINE-PRILOCAINE 2.5-2.5 % EX CREA
1.0000 | TOPICAL_CREAM | CUTANEOUS | 3 refills | Status: AC | PRN
  Filled 2023-02-02: qty 30, 30d supply, fill #0

## 2023-02-03 ENCOUNTER — Ambulatory Visit: Payer: Medicare Other | Admitting: Internal Medicine

## 2023-02-05 ENCOUNTER — Inpatient Hospital Stay: Payer: Medicare Other | Attending: Gynecologic Oncology | Admitting: Internal Medicine

## 2023-02-05 VITALS — BP 129/54 | HR 85 | Temp 97.9°F | Resp 16 | Wt 97.6 lb

## 2023-02-05 DIAGNOSIS — R569 Unspecified convulsions: Secondary | ICD-10-CM | POA: Diagnosis not present

## 2023-02-05 DIAGNOSIS — C482 Malignant neoplasm of peritoneum, unspecified: Secondary | ICD-10-CM | POA: Diagnosis present

## 2023-02-05 DIAGNOSIS — C7931 Secondary malignant neoplasm of brain: Secondary | ICD-10-CM

## 2023-02-05 DIAGNOSIS — G620 Drug-induced polyneuropathy: Secondary | ICD-10-CM

## 2023-02-05 DIAGNOSIS — T451X5A Adverse effect of antineoplastic and immunosuppressive drugs, initial encounter: Secondary | ICD-10-CM

## 2023-02-05 DIAGNOSIS — Z79899 Other long term (current) drug therapy: Secondary | ICD-10-CM | POA: Insufficient documentation

## 2023-02-05 NOTE — Progress Notes (Signed)
[facility] at [facility] [address]  [place], [place] [address] [phone]   Interval Evaluation  Date of Service: 02/05/23 Patient Name: [name] [name] Patient MRN: [id number] Patient DOB: 11/25/1950 Provider: Henreitta Leber, MD  Identifying Statement:  [name] [name] is a 72 y.o. 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 Findings highly suspicious for peritoneal carcinomatosis. Recommend paracentesis for therapeutic and diagnostic purposes. I do not see an obvious primary lesion but there is some irregular enhancing soft tissue in the right adnexal area. CA 125 level may be helpful. 2. Mild surface irregularity involving the liver but I do not see any obvious changes of cirrhosis. No hepatic lesions.   11/23/2018 Tumor Marker   Patient's tumor was tested for the following markers: CA-125 Results of the tumor marker test revealed 253.   11/25/2018 Initial Diagnosis   Peritoneal carcinoma (HCC)   11/25/2018 Imaging   US paracentesis Successful ultrasound-guided paracentesis yielding 3.4 L of peritoneal fluid.   12/01/2018 Cancer Staging   Staging form: Ovary, Fallopian Tube, and Primary Peritoneal Carcinoma, AJCC 8th Edition - Clinical stage from 12/01/2018: Stage IVA (rcT3, cN0, pM1a) - Signed by Artis Delay, MD on 02/14/2021 Stage prefix: Recurrence   12/02/2018 Tumor Marker   Patient's tumor was tested for the following markers: CA-125 Results of the tumor marker test revealed 267   12/06/2018 - 05/16/2019 Chemotherapy   The patient had carboplatin and taxol for chemotherapy treatment.     12/08/2018 Procedure    Successful ultrasound-guided therapeutic paracentesis yielding 1.7 liters of peritoneal fluid.   12/24/2018 Procedure   Successful placement of a right IJ approach Power Port with ultrasound and fluoroscopic guidance. The catheter is ready for use   01/17/2019 Tumor Marker   Patient's tumor was tested for the following markers: CA-125 Results of the tumor marker test revealed 57.2   01/31/2019 Imaging   Significant decrease in peritoneal carcinomatosis since previous study. Interval resolution of ascites.   Focal area of parenchymal consolidation in central right middle lobe, which measures 3 cm. Differential diagnosis includes infectious or inflammatory process, atelectasis, and neoplasm. Recommend short-term follow-up by chest CT in 2-3 months.     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   Surgeon: Quinn Axe    Operative Findings:  : grossly normal uterus, ovaries normal, few scattered peritoneal nodules (1mm) on serosa of uterus and tubes. The omentum (gastrocolic) was tethered to the mesentery with tumor (thin rind). Complete (optimal) resection of tumor with no gross residual disease.     02/17/2019 Pathology Results   FINAL MICROSCOPIC DIAGNOSIS: A. UTERUS, CERVIX, BILATERAL FALLOPIAN TUBES AND OVARIES, HYSTERECTOMY WITH SALPINGOOOPHORECTOMY: - Uterus: Endometrium: Inactive endometrium. No hyperplasia or malignancy. Myometrium: Unremarkable. No malignancy. Serosa: Metastatic carcinoma. No malignancy. - Cervix: Benign squamous and endocervical mucosa. No dysplasia or malignancy. - Left ovary and fallopian tube: Metastatic carcinoma. - Right ovary: No malignancy identified. - Right fallopian tube: Luminal tumor, see  comment. B. OMENTUM, RESECTION: -  High grade serous carcinoma. - Deposits up to at least 3 cm. - See oncology table. ONCOLOGY TABLE: OVARY or FALLOPIAN TUBE or PRIMARY PERITONEUM: Procedure: Hysterectomy with bilateral salpingo-oophorectomy and omentectomy. Specimen Integrity: Intact Tumor Site: Peritoneum Ovarian Surface Involvement (required only if applicable): Left ovary Fallopian Tube Surface Involvement (required only if applicable): Left Fallopian tube Tumor Size: Largest deposit 3 cm Histologic Type: High-grade serous carcinoma Histologic Grade: High-grade Implants (required for advanced stage serous/seromucinous borderline tumors only): Uterine serosa, left fallopian tube and ovary, omentum Other Tissue/ Organ Involvement: As above Largest Extrapelvic Peritoneal Focus (required only if applicable): 3 cm Peritoneal/Ascitic Fluid: Pre neoadjuvant [id number] positive for carcinoma. Treatment Effect (required only for high-grade serous carcinomas): Probably partial treatment effect in omental tissue. Regional Lymph Nodes: No lymph nodes submitted or found Pathologic Stage Classification (pTNM, AJCC 8th Edition): ypT3c, ypNX Representative Tumor Block: B5 Comment(s): There is surface involvement of the left ovary and fallopian tube but no primary tumor. Within the right fallopian tube lumen there is detached fragments of tumor but again no precursor lesion is noted in the right fallopian tube. Thus, the tumor is presumed primary peritoneal and staged as such.   03/21/2019 Tumor Marker   Patient's tumor was tested for the following markers: CA-125 Results of the tumor marker test revealed 15.1    Genetic Testing   Negative genetic testing. No pathogenic variants identified on the [employer] CancerNext + RNAinsight panel. VUS in ATM called [id number] identified. The report date is 05/12/2019. TumorNext HRD was originally ordered but there was not enough sample to complete this testing.   The  CancerNext+RNAinsight gene panel offered by [employer] includes sequencing and rearrangement analysis for the following 36 genes: APC*, ATM*, AXIN2, BARD1, BMPR1A, BRCA1*, BRCA2*, BRIP1*, CDH1*, CDK4, CDKN2A, CHEK2*, DICER1, MLH1*, MSH2*, MSH3, MSH6*, MUTYH*, NBN, NF1*, NTHL1, PALB2*, PMS2*, PTEN*, RAD51C*, RAD51D*, RECQL, SMAD4, SMARCA4, STK11 and TP53* (sequencing and deletion/duplication); HOXB13, POLD1 and POLE (sequencing only); EPCAM and GREM1 (deletion/duplication only). DNA and RNA analyses performed for * genes.    05/16/2019 Tumor Marker   Patient's tumor was tested for the following markers: CA-125 Results of the tumor marker test revealed 10.7   06/16/2019 Imaging   1. No evidence of residual or recurrent metastatic disease in the chest, abdomen or pelvis. 2. Masslike focus of consolidation in the right middle lobe along the major and minor fissures with associated volume loss is stable since 01/31/2019. Indolent primary pulmonary neoplasm not excluded. PET-CT may be considered for further characterization. 3. Aortic Atherosclerosis (ICD10-I70.0).   06/16/2019 Tumor Marker   Patient's tumor was tested for the following markers: CA-125 Results of the tumor marker test revealed 9.1.   12/05/2019 Tumor Marker   Patient's tumor was tested for the following markers: CA-125. Results of the tumor marker test revealed 9.0.   02/29/2020 Tumor Marker   Patient's tumor was tested for the following markers: CA-`125 Results of the tumor marker test revealed 9.7.   05/28/2020 Tumor Marker   Patient's tumor was tested for the following markers: CA-125 Results of the tumor marker test revealed 9.5   08/16/2020 Tumor Marker   Patient's tumor was tested for the following markers: CA-125 Results of the tumor marker test revealed 10.9   11/29/2020 Tumor Marker   Patient's tumor was tested for the following markers: CA-125. Results of the tumor marker test revealed 10.2.   01/22/2021 Pathology  Results   SURGICAL PATHOLOGY  CASE: [phone]  PATIENT: [name] [name]  Surgical Pathology Report  Clinical History: brain metastasis (cm)    FINAL MICROSCOPIC DIAGNOSIS:   A. 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    03/20/2021 Imaging   1. Unchanged enlarged midline superior mediastinal and left retroperitoneal lymph nodes, which remain suspicious for metastatic disease. 2. Unchanged masslike consolidation and volume loss of the right middle lobe measuring 3.8 x 1.9 cm, likely chronic scarring. Continued attention on follow-up. 3. No new evidence of metastatic disease in the chest, abdomen, or pelvis. 4. Status post hysterectomy and omentectomy.   Aortic Atherosclerosis (ICD10-I70.0).     03/21/2021 - 05/07/2021 Chemotherapy   Patient is on Treatment Plan : OVARIAN RECURRENT 3RD LINE Carboplatin D1 / Gemcitabine D1,8 (4/800) q21d     05/17/2021 Imaging   1. No new suspicious mass or lymphadenopathy identified in the chest, abdomen or pelvis. 2. Interval decreased size of previous enlarged superior mediastinal and left retroperitoneal lymph nodes. 3. Other ancillary findings as described.   05/21/2021 - 08/13/2021 Chemotherapy   Patient is on Treatment Plan : OVARIAN Gemcitabine D1,8 q21d      08/16/2021 Imaging   MRI brain  1. Regression of disease. Resolved marginal enhancement at the right middle frontal gyrus resection cavity and regressed enhancement at the treated left parietal lesion.   2. No new metastatic disease or acute intracranial abnormality.   08/19/2021 Imaging   Slight decrease in size of small left retroperitoneal and superior mediastinal lymph nodes.   No new or progressive disease within the chest, abdomen, or pelvis.   Aortic Atherosclerosis (ICD10-I70.0).     11/29/2021 Imaging   1. Slightly decreased size of the  small left retroperitoneal lymph node with stable superior mediastinal lymph node. 2. No new or progressive disease within the chest, abdomen or pelvis. 3.  Aortic Atherosclerosis (ICD10-I70.0).     03/27/2022 Imaging   1. No current findings of active malignancy. 2. Stable chronic atelectasis of the right middle lobe. 3. Mild aortoiliac atherosclerotic vascular calcification. 4. Degenerative subcortical cystic lesion along the left acetabulum. 5. Old healed left posterior sixth and seventh rib fractures. 6. Aortic atherosclerosis.   07/22/2022 Imaging   CT CHEST ABDOMEN PELVIS W CONTRAST  Result Date: 07/21/2022 CLINICAL DATA:  Restaging ovarian cancer.  * Tracking Code: BO *. EXAM: CT CHEST, ABDOMEN, AND PELVIS WITH CONTRAST TECHNIQUE: Multidetector CT imaging of the chest, abdomen and pelvis was performed following the standard protocol during bolus administration of intravenous contrast. RADIATION DOSE REDUCTION: This exam was performed according to the departmental dose-optimization program which includes automated exposure control, adjustment of the mA and/or kV according to patient size and/or use of iterative reconstruction technique. CONTRAST:  80mL OMNIPAQUE IOHEXOL 300 MG/ML  SOLN COMPARISON:  03/25/2022 FINDINGS: CT CHEST FINDINGS Cardiovascular: Mild cardiac enlargement. No pericardial effusion. Aortic atherosclerotic calcifications. Mediastinum/Nodes: Thyroid gland, trachea, and esophagus are unremarkable. No enlarged mediastinal or hilar lymph nodes. No axillary adenopathy. Lungs/Pleura: No pleural fluid. Stable appearance of atelectasis involving the right middle lobe. No suspicious pulmonary nodules identified. Musculoskeletal: T3 vertebral hemangioma. No aggressive or acute osseous findings. Remote left posterior rib fractures. CT ABDOMEN PELVIS FINDINGS Hepatobiliary: No focal liver abnormality is seen. No gallstones, gallbladder wall thickening, or biliary dilatation. Pancreas:  Unremarkable. No pancreatic ductal dilatation or surrounding inflammatory changes. Spleen: Normal in size without focal abnormality. Adrenals/Urinary Tract: Normal adrenal glands. No kidney mass or hydronephrosis identified. Urinary bladder  is unremarkable. Stomach/Bowel: Stomach appears normal. No pathologic dilatation of the large or small bowel loops. No bowel wall thickening or inflammation. Vascular/Lymphatic: Aortic atherosclerosis. No aneurysm. New soft tissue nodule or lymph node along the gastrohepatic ligament measures 0.7 cm, image 61/2. Adjacent nodule is also new measuring 0.8 cm, image 60/2. The previously referenced right gastric lymph node measuring 8 mm is unchanged, image 66/3. No enlarged pelvic or inguinal lymph nodes. Reproductive: Status post hysterectomy. No adnexal masses. Other: Status post prior omentectomy. No ascites. No discrete fluid collections identified. Musculoskeletal: No acute or significant osseous findings. IMPRESSION: 1. New soft tissue nodules or lymph nodes along the gastrohepatic ligament are identified measuring up to 0.8 cm. Recurrent peritoneal disease or nodal disease cannot be excluded. 2. Stable appearance of previously referenced right gastric lymph node measuring 8 mm. 3. No signs of metastatic disease to the chest. 4.  Aortic Atherosclerosis (ICD10-I70.0). Electronically Signed   By: Signa Kell M.D.   On: 07/21/2022 09:47      11/09/2022 Tumor Marker   Patient's tumor was tested for the following markers: CA-125. Results of the tumor marker test revealed 8.2.   11/11/2022 Imaging   CT CHEST ABDOMEN PELVIS W CONTRAST  Result Date: 11/10/2022 CLINICAL DATA:  Ovarian cancer restaging.  * Tracking Code: BO *. EXAM: CT CHEST, ABDOMEN, AND PELVIS WITH CONTRAST TECHNIQUE: Multidetector CT imaging of the chest, abdomen and pelvis was performed following the standard protocol during bolus administration of intravenous contrast. RADIATION DOSE REDUCTION: This exam  was performed according to the departmental dose-optimization program which includes automated exposure control, adjustment of the mA and/or kV according to patient size and/or use of iterative reconstruction technique. CONTRAST:  80mL OMNIPAQUE IOHEXOL 300 MG/ML  SOLN COMPARISON:  None Available. FINDINGS: CT CHEST FINDINGS Cardiovascular: The heart size is normal. No substantial pericardial effusion. Right Port-A-Cath tip is positioned at the SVC/RA junction. Mediastinum/Nodes: No mediastinal lymphadenopathy. There is no hilar lymphadenopathy. The esophagus has normal imaging features. There is no axillary lymphadenopathy. Lungs/Pleura: Biapical pleuroparenchymal scarring evident. Right middle lobe collapse/consolidation is stable. No new suspicious pulmonary nodule or mass. Musculoskeletal: No worrisome lytic or sclerotic osseous abnormality. CT ABDOMEN PELVIS FINDINGS Hepatobiliary: No suspicious focal abnormality within the liver parenchyma. There is no evidence for gallstones, gallbladder wall thickening, or pericholecystic fluid. No intrahepatic or extrahepatic biliary dilation. Pancreas: No focal mass lesion. No dilatation of the main duct. No intraparenchymal cyst. No peripancreatic edema. Spleen: No splenomegaly. No suspicious focal mass lesion. Adrenals/Urinary Tract: No adrenal nodule or mass. Kidneys unremarkable. No evidence for hydroureter. The urinary bladder appears normal for the degree of distention. Stomach/Bowel: Stomach is unremarkable. No gastric wall thickening. No evidence of outlet obstruction. Duodenum is normally positioned as is the ligament of Treitz. No small bowel wall thickening. No small bowel dilatation. The terminal ileum is normal. The appendix is normal. No gross colonic mass. No colonic wall thickening. Vascular/Lymphatic: There is mild atherosclerotic calcification of the abdominal aorta without aneurysm. 8 mm short axis lymph nodes in the gastrohepatic ligament (images 59 in  60 of series 2 are stable in the interval. Small left para-aortic lymph nodes are stable. No retroperitoneal or mesenteric lymphadenopathy. No pelvic sidewall lymphadenopathy. Reproductive: Hysterectomy.  There is no adnexal mass. Other: No intraperitoneal free fluid. Musculoskeletal: No worrisome lytic or sclerotic osseous abnormality. IMPRESSION: 1. Stable exam. No new or progressive findings to suggest recurrent or metastatic disease in the chest, abdomen, or pelvis. 2. Small lymph nodes in the gastrohepatic  ligament identified previously are stable in the interval. Continued attention on follow-up recommended. 3. Stable right middle lobe collapse/consolidation. 4.  Aortic Atherosclerosis (ICD10-I70.0). Electronically Signed   By: Kennith Center M.D.   On: 11/10/2022 14:58       CNS Oncologic History 01/22/21: R frontal craniotomy, resection with Dr. Jake Samples 02/21/21: Post-op SRS and SRS to L parietal target [name] [name])  Interval History: [name] [name] presents for follow up.  Vertigo, dizziness are unchanged from prior.  No further seizures.  Does have ongoing short term memory issues, fatigue.  Continues on Keppra 1000mg  twice per day.  Continues on observation with Dr. Bertis Ruddy.  H+P (12/25/20) Patient presented to the ED on 12/22/20 with twitching of her left side of her face for 1 to 2 minutes, accompanied by slurring of speech.  There was no tongue biting, bowel or bladder incontinence.  Workup demonstrated two brain lesions consistent with suspected metastases.  She was started on Keppra (no decadron) and discharged two days later with no further seizure like events.  Priro to admission she had also been complaining of dizziness, confusion, impaired memory.  Has been seeing Dr. Bertis Ruddy since 2021 for primary peritoneal carcinoma.   Medications: Current Outpatient Medications on File Prior to Visit  Medication Sig Dispense Refill   Azelaic Acid 15 % cream Apply 1 application topically daily.      gabapentin (NEURONTIN) 300 MG capsule Take 300 mg by mouth 3 (three) times daily.     levETIRAcetam (KEPPRA) 500 MG tablet TAKE 2 TABLETS(1000 MG) BY MOUTH TWICE DAILY 120 tablet 3   Magnesium 250 MG TABS Take 250 mg by mouth daily as needed.     cholecalciferol (VITAMIN D3) 25 MCG (1000 UNIT) tablet Take 2,000 Units by mouth daily.     lidocaine-prilocaine (EMLA) cream Apply to affected area(s) as needed. 30 g 3   LORazepam (ATIVAN) 2 MG tablet Take 1 tablet (2 mg total) by mouth every 6 (six) hours as needed for seizure. (Patient not taking: Reported on 08/04/2022) 10 tablet 0   Sulfacetamide Sodium, Acne, 10 % LOTN Apply 1 application topically at bedtime. (Patient not taking: Reported on 02/05/2023)     No current facility-administered medications on file prior to visit.    Allergies:  Allergies  Allergen Reactions   Carboplatin Itching and Rash    Patient has a hypersensitivity reaction to carboplatin.  See note from 04/30/2021 at 1439.  Reaction symptoms included itching of mouth and ears, burning in chest, hypotension, and rash to neck and arms.  Patient unable to complete infusion.   Fentanyl Palpitations    Palpitations , fever ,headahe, N/V  " loud pounding heart beat"    Vancomycin Rash   Milk (Cow) Diarrhea and Nausea And Vomiting   Brimonidine Tartrate     Other reaction(s): blurred vision   Brimonidine Tartrate     Other reaction(s): blurred vision   Ditropan [Oxybutynin]    Milk-Related Compounds Diarrhea and Nausea And Vomiting   Other Other (See Comments)   Oxybutynin Chloride Other (See Comments)    Ditropan  Pt doesn't remember    Thimerosal (Thiomersal) Other (See Comments)    Pt doesn't remember   Tramadol Other (See Comments)    Pt doesn't remember   Avelox [Moxifloxacin Hcl In Nacl] Rash   Doxycycline Rash   Erythromycin Rash   Metronidazole Rash   Minocycline Rash   Penicillins Rash    Did it involve swelling of the face/tongue/throat, SOB, or low BP?  No Did it involve sudden or severe rash/hives, skin peeling, or any reaction on the inside of your mouth or nose? Yes, rash. Did you need to seek medical attention at a hospital or doctor's office? No When did it last happen?      Childhood allergy If all above answers are "NO", may proceed with cephalosporin use.    Quinolones Rash   Sulfonamide Derivatives Rash   Past Medical History:  Past Medical History:  Diagnosis Date   Allergic rhinitis    Aortic atherosclerosis (HCC)    Aortic insufficiency    Mild echo 2024   Aortic insufficiency    mild by echo 10/2021   Cervical dysplasia 1980   Cervicalgia    Eczema    Rosacea,dermatitis-Dr Stinehelfer   Esophageal reflux    Family history of colon cancer    Family history of melanoma    Family history of ovarian cancer    Family history of pancreatic cancer    Family history of prostate cancer    Family history of uterine cancer    Fibromyalgia    GERD (gastroesophageal reflux disease)    Glaucoma, narrow-angle    Heart murmur    Hiatal hernia    Irritable bowel syndrome    LBBB (left bundle branch block)    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), unspecified site    DCIS   Mass of lung    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 Edwyna Shell   Mitral valve disorders(424.0)    No MVP and trivial MR by echo 10/2021   Osteoporosis 12/2016   T score -2.3, 2014 T score -2.5 AP spine   PAC (premature atrial contraction) 2012   ,PVC's, and nonsustained atril tachycardia w aberration by heart monitor    Pulmonic valve insufficiency    mild by echo 8/23   PVC (premature ventricular contraction)    Rosacea    Stricture and stenosis of esophagus    Tricuspid insufficiency    mild by echo 10/2021   Past Surgical History:  Past Surgical History:  Procedure Laterality Date   APPLICATION OF CRANIAL NAVIGATION N/A 01/22/2021   Procedure: APPLICATION OF CRANIAL NAVIGATION;  Surgeon: Bethann Goo, DO;  Location: [facility] OR;  Service:  Neurosurgery;  Laterality: N/A;   BREAST LUMPECTOMY  1998   right breast with radiation   CERVICAL DISCECTOMY     C3-7 with bone graft, in three different surgeries, have titanium plate and screws   CRANIOTOMY N/A 01/22/2021   Procedure: STEREOTACTIC RIGHT FRONTAL CRANIOTOMY FOR RESECTION OF TUMOR;  Surgeon: Bethann Goo, DO;  Location: [facility] OR;  Service: Neurosurgery;  Laterality: N/A;   GYNECOLOGIC CRYOSURGERY  1980   IR IMAGING GUIDED PORT INSERTION  12/24/2018   NECK SURGERY  1993-1997   bone graft and fusion   OMENTECTOMY  02/17/2019   REFRACTIVE SURGERY     narrow angle glucoma   RHINOPLASTY  1976   sterotactic large core needle biopsy right breast Right 10/15/1996   TUBAL LIGATION  1977   Social History:  Social History   Socioeconomic History   Marital status: Married    Spouse name: Hessie Diener   Number of children: 0   Years of education: Not on file   Highest education level: Not on file  Occupational History   Occupation: Educational psychologist: [employer]  Tobacco Use   Smoking status: Never   Smokeless tobacco: Never  Vaping Use   Vaping status: Never Used  Substance and Sexual Activity   Alcohol use: Yes    Alcohol/week: 2.0 standard drinks of alcohol    Types: 2 Standard drinks or equivalent per week    Comment: Social    Drug use: No   Sexual activity: Not Currently    Birth control/protection: Post-menopausal    Comment: 1st intercourse 71 yo- More than 5 partners  Other Topics Concern   Not on file  Social History Narrative   Not on file   Social Determinants of Health   Financial Resource Strain: Not on file  Food Insecurity: Not on file  Transportation Needs: Not on file  Physical Activity: Not on file  Stress: Not on file  Social Connections: Unknown (07/23/2021)   Received from [facility]   Social Network    Social Network: Not on file  Intimate Partner Violence: Unknown (06/21/2021)   Received from [facility],  [facility]   HITS    Physically Hurt: Not on file    Insult or Talk Down To: Not on file    Threaten Physical Harm: Not on file    Scream or Curse: Not on file   Family History:  Family History  Problem Relation Age of Onset   Heart disease Mother    Depression Mother    Hypertension Mother    Heart disease Father    Hypertension Father    Aneurysm Father    Uterine cancer Sister 42   Hypertension Sister    Ovarian cancer Sister 81   Melanoma Sister 29   Depression Sister    Melanoma Sister 87   Heart disease Paternal Grandfather    Stroke Maternal Grandmother    Cancer Maternal Grandfather 40       Pancreatic   Stroke Paternal Grandmother    Cancer Paternal Uncle        multiple myeloma, prostate cancer and melanoma (separate primaries)   Colon cancer Neg Hx     Review of Systems: Constitutional: Doesn't report fevers, chills or abnormal weight loss Eyes: Doesn't report blurriness of vision Ears, nose, mouth, throat, and face: Doesn't report sore throat Respiratory: Doesn't report cough, dyspnea or wheezes Cardiovascular: Doesn't report palpitation, chest discomfort  Gastrointestinal:  Doesn't report nausea, constipation, diarrhea GU: Doesn't report incontinence Skin: Doesn't report skin rashes Neurological: Per HPI Musculoskeletal: Doesn't report joint pain Behavioral/Psych: Doesn't report anxiety  Physical Exam: Wt Readings from Last 3 Encounters:  02/05/23 97 lb 9.6 oz (44.3 kg)  11/11/22 97 lb 6.4 oz (44.2 kg)  08/04/22 98 lb 4.8 oz (44.6 kg)   Temp Readings from Last 3 Encounters:  02/05/23 97.9 F (36.6 C) (Oral)  11/11/22 98.4 F (36.9 C) (Tympanic)  08/04/22 98.2 F (36.8 C) (Temporal)   BP Readings from Last 3 Encounters:  02/05/23 (!) 129/54  11/11/22 (!) 127/52  08/04/22 (!) 127/50   Pulse Readings from Last 3 Encounters:  02/05/23 85  11/11/22 81  08/04/22 63   KPS: 80. General: Alert, cooperative, pleasant, in no acute  distress Head: Normal EENT: No conjunctival injection or scleral icterus.  Lungs: Resp effort normal Cardiac: Regular rate Abdomen: Non-distended abdomen Skin: No rashes cyanosis or petechiae. Extremities: No clubbing or edema  Neurologic Exam: Mental Status: Awake, alert, attentive to examiner. Oriented to self and environment. Language is fluent with intact comprehension.  Cranial Nerves: Visual acuity is grossly normal. Visual fields are full. Extra-ocular movements intact. No ptosis. Face is symmetric Motor: Tone and bulk are normal. Power is  full in both arms and legs. Reflexes are symmetric, no pathologic reflexes present.  Sensory: Intact to light touch Gait: Normal.   Labs: I have reviewed the data as listed    Component Value Date/Time   NA 137 11/06/2022 0741   K 3.7 11/06/2022 0741   CL 100 11/06/2022 0741   CO2 26 11/06/2022 0741   GLUCOSE 93 11/06/2022 0741   BUN 8 11/06/2022 0741   CREATININE 0.57 11/06/2022 0741   CALCIUM 9.4 11/06/2022 0741   PROT 7.3 11/06/2022 0741   ALBUMIN 4.3 11/06/2022 0741   AST 33 11/06/2022 0741   ALT 25 11/06/2022 0741   ALKPHOS 64 11/06/2022 0741   BILITOT 0.5 11/06/2022 0741   GFRNONAA >60 11/06/2022 0741   GFRAA >60 12/05/2019 1145   Lab Results  Component Value Date   WBC 5.3 11/06/2022   NEUTROABS 3.5 11/06/2022   HGB 11.7 (L) 11/06/2022   HCT 34.2 (L) 11/06/2022   MCV 89.5 11/06/2022   PLT 219 11/06/2022    Imaging:  [facility] Clinician Interpretation: I have personally reviewed the CNS images as listed.  My interpretation, in the context of the patient's clinical presentation, is stable disease  MR BRAIN W WO CONTRAST  Result Date: 01/29/2023 CLINICAL DATA:  CNS neoplasm follow-up EXAM: MRI HEAD WITHOUT AND WITH CONTRAST TECHNIQUE: Multiplanar, multiecho pulse sequences of the brain and surrounding structures were obtained without and with intravenous contrast. CONTRAST:  5 cc of vueway intravenous COMPARISON:   07/31/2022 FINDINGS: Brain: No suspicious or recurrent enhancement at the right frontal resection site. No new lesion. 2 mm nodular enhancement just above the foramen magnum, near the left hypoglossal canal is unchanged and may be an benign enhancing lesion of the foramen magnum or meningioma. No incidental infarct, hemorrhage, hydrocephalus, or collection. Vascular: Major flow voids and vascular enhancements are preserved Skull and upper cervical spine: Unremarkable right craniotomy site. Sinuses/Orbits: Unremarkable IMPRESSION: No evidence of residual or recurrent disease. Electronically Signed   By: Tiburcio Pea M.D.   On: 01/29/2023 10:09     Assessment/Plan Secondary malignant neoplasm of brain [facility])  Peripheral neuropathy due to chemotherapy [facility])  [place] is clinically and radiographically stable today.  No clinical changes.  For focal seizures, will con't Keppra 1000mg  BID.  Ativan 2mg  will be provided as emergency/rescue seizure medication.   Will con't to follow with Dr. Bertis Ruddy as well.  We appreciate the opportunity to participate in the care of [name] [name].    We ask that [name] [name] return to clinic in 6 months following next brain MRI, or sooner as needed.    All questions were answered. The patient knows to call the clinic with any problems, questions or concerns. No barriers to learning were detected.  The total time spent in the encounter was 40 minutes and more than 50% was on counseling and review of test results   Henreitta Leber, MD Medical Director of Neuro-Oncology [facility] [place] 02/05/23 9:56 AM

## 2023-02-06 ENCOUNTER — Other Ambulatory Visit: Payer: Self-pay | Admitting: Radiology

## 2023-02-18 DIAGNOSIS — C7931 Secondary malignant neoplasm of brain: Secondary | ICD-10-CM | POA: Diagnosis not present

## 2023-02-19 DIAGNOSIS — H6992 Unspecified Eustachian tube disorder, left ear: Secondary | ICD-10-CM | POA: Diagnosis not present

## 2023-02-24 ENCOUNTER — Inpatient Hospital Stay: Payer: Medicare Other | Attending: Gynecologic Oncology

## 2023-02-24 DIAGNOSIS — Z452 Encounter for adjustment and management of vascular access device: Secondary | ICD-10-CM | POA: Insufficient documentation

## 2023-02-24 DIAGNOSIS — C7931 Secondary malignant neoplasm of brain: Secondary | ICD-10-CM | POA: Diagnosis present

## 2023-02-24 DIAGNOSIS — C482 Malignant neoplasm of peritoneum, unspecified: Secondary | ICD-10-CM | POA: Insufficient documentation

## 2023-02-24 MED ORDER — SODIUM CHLORIDE 0.9% FLUSH
10.0000 mL | Freq: Once | INTRAVENOUS | Status: AC
  Administered 2023-02-24: 10 mL

## 2023-02-24 MED ORDER — HEPARIN SOD (PORK) LOCK FLUSH 100 UNIT/ML IV SOLN
500.0000 [IU] | Freq: Once | INTRAVENOUS | Status: AC
  Administered 2023-02-24: 500 [IU]

## 2023-03-24 ENCOUNTER — Inpatient Hospital Stay: Payer: Medicare Other | Attending: Gynecologic Oncology

## 2023-03-24 ENCOUNTER — Ambulatory Visit (HOSPITAL_COMMUNITY)
Admission: RE | Admit: 2023-03-24 | Discharge: 2023-03-24 | Disposition: A | Discharge status: Home / Self Care | Payer: Medicare Other | Source: Ambulatory Visit | Attending: Hematology and Oncology | Admitting: Hematology and Oncology

## 2023-03-24 DIAGNOSIS — C7931 Secondary malignant neoplasm of brain: Secondary | ICD-10-CM | POA: Insufficient documentation

## 2023-03-24 DIAGNOSIS — G620 Drug-induced polyneuropathy: Secondary | ICD-10-CM | POA: Diagnosis not present

## 2023-03-24 DIAGNOSIS — I700 Atherosclerosis of aorta: Secondary | ICD-10-CM | POA: Diagnosis not present

## 2023-03-24 DIAGNOSIS — Z452 Encounter for adjustment and management of vascular access device: Secondary | ICD-10-CM | POA: Diagnosis not present

## 2023-03-24 DIAGNOSIS — K760 Fatty (change of) liver, not elsewhere classified: Secondary | ICD-10-CM | POA: Diagnosis not present

## 2023-03-24 DIAGNOSIS — C482 Malignant neoplasm of peritoneum, unspecified: Secondary | ICD-10-CM | POA: Insufficient documentation

## 2023-03-24 DIAGNOSIS — R911 Solitary pulmonary nodule: Secondary | ICD-10-CM | POA: Insufficient documentation

## 2023-03-24 DIAGNOSIS — M810 Age-related osteoporosis without current pathological fracture: Secondary | ICD-10-CM | POA: Insufficient documentation

## 2023-03-24 DIAGNOSIS — C569 Malignant neoplasm of unspecified ovary: Secondary | ICD-10-CM | POA: Diagnosis not present

## 2023-03-24 LAB — COMPREHENSIVE METABOLIC PANEL
ALT: 16 U/L (ref 0–44)
AST: 23 U/L (ref 15–41)
Albumin: 4.5 g/dL (ref 3.5–5.0)
Alkaline Phosphatase: 61 U/L (ref 38–126)
Anion gap: 7 (ref 5–15)
BUN: 11 mg/dL (ref 8–23)
CO2: 29 mmol/L (ref 22–32)
Calcium: 9.5 mg/dL (ref 8.9–10.3)
Chloride: 101 mmol/L (ref 98–111)
Creatinine, Ser: 0.54 mg/dL (ref 0.44–1.00)
GFR, Estimated: >60 mL/min (ref >60)
Glucose, Bld: 89 mg/dL (ref 70–99)
Potassium: 3.7 mmol/L (ref 3.5–5.1)
Sodium: 137 mmol/L (ref 135–145)
Total Bilirubin: 0.7 mg/dL (ref 0.0–1.2)
Total Protein: 7.4 g/dL (ref 6.5–8.1)

## 2023-03-24 LAB — CBC WITH DIFFERENTIAL/PLATELET
Abs Immature Granulocytes: 0.01 10*3/uL (ref 0.00–0.07)
Basophils Absolute: 0 10*3/uL (ref 0.0–0.1)
Basophils Relative: 1 %
Eosinophils Absolute: 0.1 10*3/uL (ref 0.0–0.5)
Eosinophils Relative: 1 %
HCT: 35.7 % — ABNORMAL LOW (ref 36.0–46.0)
Hemoglobin: 12 g/dL (ref 12.0–15.0)
Immature Granulocytes: 0 %
Lymphocytes Relative: 26 %
Lymphs Abs: 1.7 10*3/uL (ref 0.7–4.0)
MCH: 29.9 pg (ref 26.0–34.0)
MCHC: 33.6 g/dL (ref 30.0–36.0)
MCV: 89 fL (ref 80.0–100.0)
Monocytes Absolute: 0.5 10*3/uL (ref 0.1–1.0)
Monocytes Relative: 8 %
Neutro Abs: 4 10*3/uL (ref 1.7–7.7)
Neutrophils Relative %: 64 %
Platelets: 227 10*3/uL (ref 150–400)
RBC: 4.01 MIL/uL (ref 3.87–5.11)
RDW: 12.7 % (ref 11.5–15.5)
WBC: 6.2 10*3/uL (ref 4.0–10.5)
nRBC: 0 % (ref 0.0–0.2)

## 2023-03-24 MED ORDER — IOHEXOL 300 MG/ML  SOLN
100.0000 mL | Freq: Once | INTRAMUSCULAR | Status: AC | PRN
  Administered 2023-03-24: 100 mL via INTRAVENOUS

## 2023-03-24 MED ORDER — HEPARIN SOD (PORK) LOCK FLUSH 100 UNIT/ML IV SOLN
INTRAVENOUS | Status: AC
  Filled 2023-03-24: qty 5

## 2023-03-24 MED ORDER — SODIUM CHLORIDE 0.9% FLUSH
10.0000 mL | Freq: Once | INTRAVENOUS | Status: AC
Start: 2023-03-24 — End: 2023-03-24
  Administered 2023-03-24: 10 mL

## 2023-03-24 MED ORDER — IOHEXOL 300 MG/ML  SOLN
30.0000 mL | Freq: Once | INTRAMUSCULAR | Status: AC | PRN
  Administered 2023-03-24: 30 mL via ORAL

## 2023-03-24 MED ORDER — HEPARIN SOD (PORK) LOCK FLUSH 100 UNIT/ML IV SOLN
500.0000 [IU] | Freq: Once | INTRAVENOUS | Status: AC
  Administered 2023-03-24: 500 [IU] via INTRAVENOUS

## 2023-03-25 LAB — CA 125: Cancer Antigen (CA) 125: 8.9 U/mL (ref 0.0–38.1)

## 2023-03-31 ENCOUNTER — Inpatient Hospital Stay (HOSPITAL_BASED_OUTPATIENT_CLINIC_OR_DEPARTMENT_OTHER): Payer: Medicare Other | Admitting: Hematology and Oncology

## 2023-03-31 ENCOUNTER — Encounter: Payer: Self-pay | Admitting: Hematology and Oncology

## 2023-03-31 VITALS — BP 105/70 | HR 78 | Temp 98.7°F | Resp 18 | Ht 65.0 in | Wt 98.2 lb

## 2023-03-31 DIAGNOSIS — G620 Drug-induced polyneuropathy: Secondary | ICD-10-CM | POA: Diagnosis not present

## 2023-03-31 DIAGNOSIS — M810 Age-related osteoporosis without current pathological fracture: Secondary | ICD-10-CM

## 2023-03-31 DIAGNOSIS — C482 Malignant neoplasm of peritoneum, unspecified: Secondary | ICD-10-CM | POA: Diagnosis not present

## 2023-03-31 DIAGNOSIS — T451X5A Adverse effect of antineoplastic and immunosuppressive drugs, initial encounter: Secondary | ICD-10-CM | POA: Diagnosis not present

## 2023-03-31 DIAGNOSIS — R911 Solitary pulmonary nodule: Secondary | ICD-10-CM | POA: Diagnosis not present

## 2023-03-31 NOTE — Assessment & Plan Note (Signed)
 I have reviewed multiple imaging studies with the patient The area of concern that appears to be slightly enlarged is located in an area where biopsy is not assessable Her tumor marker was not helpful She is not symptomatic I recommend close surveillance with repeat imaging study in April I do not recommend treatment right now We will focus on lifestyle changes and supportive care

## 2023-03-31 NOTE — Assessment & Plan Note (Signed)
 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

## 2023-03-31 NOTE — Progress Notes (Signed)
 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[facility]) I have reviewed multiple imaging studies with the patient The area of concern that appears to be slightly enlarged is located in an area where biopsy is not assessable Her tumor marker was not helpful She is not symptomatic I recommend close surveillance with repeat imaging study in April I do not recommend treatment right now We will focus on lifestyle changes and supportive care  Lesion of lung She has lung collapse noted on her prior imaging dated back to 2020 I reviewed multiple chest imaging with the patient which showed no changes to her lungs and with stability   Osteoporosis She was recently diagnosed with osteoporosis She is concerned about taking medications for this We discussed other lifestyle changes such as increase dietary protein intake and weightbearing exercise She appears motivated for lifestyle changes  Peripheral neuropathy due to chemotherapy [facility]) She has residual neuropathy from prior treatment and is on high-dose gabapentin  We discussed risk and benefits of dose adjustment For now, she will continue on current dose Observe closely  Orders Placed This Encounter  Procedures   CT ABDOMEN PELVIS W CONTRAST    Standing Status:   Future    Expected Date:   06/29/2023    Expiration Date:   03/30/2024    Scheduling Instructions:     No need oral contrast    If indicated for the ordered procedure, I authorize the administration of contrast media per Radiology protocol:   Yes    Does the patient have a contrast media/X-ray dye allergy?:   No    Preferred imaging location?:   [facility]    If indicated for the ordered procedure, I authorize the administration of oral contrast media per Radiology protocol:   Yes    All  questions were answered. The patient knows to call the clinic with any problems, questions or concerns. The total time spent in the appointment was 40 minutes encounter with patients including review of chart and various tests results, discussions about plan of care and coordination of care plan   Almarie Bedford, MD 03/31/2023 10:13 AM  INTERVAL HISTORY: Please see below for problem oriented charting. she returns for surveillance follow-up and review of CT imaging results She is here accompanied by her husband She complained of chronic fatigue Her neuropathy is felt slightly worse recently She is also concerned about recent diagnosis of osteoporosis We spent a lot of time reviewing imaging study and discussed plan of care  REVIEW OF SYSTEMS:   Constitutional: Denies fevers, chills or abnormal weight loss Eyes: Denies blurriness of vision Ears, nose, mouth, throat, and face: Denies mucositis or sore throat Respiratory: Denies cough, dyspnea or wheezes Cardiovascular: Denies palpitation, chest discomfort or lower extremity swelling Gastrointestinal:  Denies nausea, heartburn or change in bowel habits Skin: Denies abnormal skin rashes Lymphatics: Denies new lymphadenopathy or easy bruising Behavioral/Psych: Mood is stable, no new changes  All other systems were reviewed with the patient and are negative.  I have reviewed the past medical history, past surgical history, social history and family history with the patient and they are unchanged from previous note.  ALLERGIES:  is allergic to carboplatin , fentanyl , vancomycin , milk (cow), brimonidine tartrate, brimonidine tartrate, ditropan [oxybutynin], milk-related compounds, other, oxybutynin chloride, thimerosal (thiomersal), tramadol, avelox [moxifloxacin hcl in nacl],  doxycycline, erythromycin, metronidazole, minocycline, penicillins, quinolones, and sulfonamide derivatives.  MEDICATIONS:  Current Outpatient Medications  Medication Sig  Dispense Refill   Azelaic Acid  15 % cream Apply 1 application topically daily.     Calcium Carb-Cholecalciferol (CALCIUM 600 + D PO) Take 600 mg by mouth daily. Calcium 600 mg with Vitamin D3 800 units     cholecalciferol (VITAMIN D3) 25 MCG (1000 UNIT) tablet Take 2,000 Units by mouth daily.     Cyanocobalamin (VITAMIN B 12) 500 MCG TABS Take 500 mcg by mouth daily.     gabapentin  (NEURONTIN ) 300 MG capsule Take 300 mg by mouth 3 (three) times daily.     levETIRAcetam  (KEPPRA ) 500 MG tablet TAKE 2 TABLETS(1000 MG) BY MOUTH TWICE DAILY 120 tablet 3   lidocaine -prilocaine  (EMLA ) cream Apply to affected area(s) as needed. 30 g 3   LORazepam  (ATIVAN ) 2 MG tablet Take 1 tablet (2 mg total) by mouth every 6 (six) hours as needed for seizure. (Patient not taking: Reported on 08/04/2022) 10 tablet 0   Magnesium 250 MG TABS Take 250 mg by mouth daily as needed.     No current facility-administered medications for this visit.    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 Findings highly suspicious for peritoneal carcinomatosis. Recommend paracentesis for therapeutic and diagnostic purposes. I do not see an obvious primary lesion but there is some irregular enhancing soft tissue in the right adnexal area. CA 125 level may be helpful. 2. Mild surface irregularity involving the liver but I do not see any obvious changes of cirrhosis. No hepatic lesions.   11/23/2018 Tumor Marker   Patient's tumor was tested for the following markers: CA-125 Results of the tumor marker test revealed 253.   11/25/2018 Initial Diagnosis   Peritoneal carcinoma (HCC)   11/25/2018 Imaging   US  paracentesis Successful ultrasound-guided paracentesis yielding 3.4 L of peritoneal fluid.   12/01/2018 Cancer Staging    Staging form: Ovary, Fallopian Tube, and Primary Peritoneal Carcinoma, AJCC 8th Edition - Clinical stage from 12/01/2018: Stage IVA (rcT3, cN0, pM1a) - Signed by Lonn Hicks, MD on 02/14/2021 Stage prefix: Recurrence   12/02/2018 Tumor Marker   Patient's tumor was tested for the following markers: CA-125 Results of the tumor marker test revealed 267   12/06/2018 - 05/16/2019 Chemotherapy   The patient had carboplatin  and taxol  for chemotherapy treatment.     12/08/2018 Procedure   Successful ultrasound-guided therapeutic paracentesis yielding 1.7 liters of peritoneal fluid.   12/24/2018 Procedure   Successful placement of a right IJ approach Power Port with ultrasound and fluoroscopic guidance. The catheter is ready for use   01/17/2019 Tumor Marker   Patient's tumor was tested for the following markers: CA-125 Results of the tumor marker test revealed 57.2   01/31/2019 Imaging   Significant decrease in peritoneal carcinomatosis since previous study. Interval resolution of ascites.   Focal area of parenchymal consolidation in central right middle lobe, which measures 3 cm. Differential diagnosis includes infectious or inflammatory process, atelectasis, and neoplasm. Recommend short-term follow-up by chest CT in 2-3 months.     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   Surgeon: Eloy Maurilio Fitch    Operative Findings:  : grossly normal uterus, ovaries normal, few scattered peritoneal nodules (1mm) on serosa of uterus and tubes. The omentum (gastrocolic) was tethered to the mesentery with tumor (thin rind). Complete (optimal) resection of tumor with no gross residual disease.     02/17/2019 Pathology Results   FINAL MICROSCOPIC DIAGNOSIS: A.  UTERUS, CERVIX, BILATERAL FALLOPIAN TUBES AND OVARIES, HYSTERECTOMY WITH SALPINGOOOPHORECTOMY: - Uterus: Endometrium: Inactive endometrium. No hyperplasia or malignancy. Myometrium: Unremarkable. No malignancy. Serosa: Metastatic carcinoma. No malignancy. - Cervix: Benign squamous and endocervical mucosa. No dysplasia or malignancy. - Left ovary and fallopian tube: Metastatic carcinoma. - Right ovary: No malignancy identified. - Right fallopian tube: Luminal tumor, see comment. B. OMENTUM, RESECTION: - High grade serous carcinoma. - Deposits up to at least 3 cm. - See oncology table. ONCOLOGY TABLE: OVARY or FALLOPIAN TUBE or PRIMARY PERITONEUM: Procedure: Hysterectomy with bilateral salpingo-oophorectomy and omentectomy. Specimen Integrity: Intact Tumor Site: Peritoneum Ovarian Surface Involvement (required only if applicable): Left ovary Fallopian Tube Surface Involvement (required only if applicable): Left Fallopian tube Tumor Size: Largest deposit 3 cm Histologic Type: High-grade serous carcinoma Histologic Grade: High-grade Implants (required for advanced stage serous/seromucinous borderline tumors only): Uterine serosa, left fallopian tube and ovary, omentum Other Tissue/ Organ Involvement: As above Largest Extrapelvic Peritoneal Focus (required only if applicable): 3 cm Peritoneal/Ascitic Fluid: Pre neoadjuvant [id number] positive for carcinoma. Treatment Effect (required only for high-grade serous carcinomas): Probably partial treatment effect in omental tissue. Regional Lymph Nodes: No lymph nodes submitted or found Pathologic Stage Classification (pTNM, AJCC 8th Edition): ypT3c, ypNX Representative Tumor Block: B5 Comment(s): There is surface involvement of the left ovary and fallopian tube but no primary tumor. Within the right fallopian tube lumen there is detached fragments of tumor but again no precursor lesion is noted in the right fallopian tube. Thus, the tumor is  presumed primary peritoneal and staged as such.   03/21/2019 Tumor Marker   Patient's tumor was tested for the following markers: CA-125 Results of the tumor marker test revealed 15.1    Genetic Testing   Negative genetic testing. No pathogenic variants identified on the [employer] CancerNext + RNAinsight panel. VUS in ATM called [id number] identified. The report date is 05/12/2019. TumorNext HRD was originally ordered but there was not enough sample to complete this testing.   The CancerNext+RNAinsight gene panel offered by [employer] includes sequencing and rearrangement analysis for the following 36 genes: APC*, ATM*, AXIN2, BARD1, BMPR1A, BRCA1*, BRCA2*, BRIP1*, CDH1*, CDK4, CDKN2A, CHEK2*, DICER1, MLH1*, MSH2*, MSH3, MSH6*, MUTYH*, NBN, NF1*, NTHL1, PALB2*, PMS2*, PTEN*, RAD51C*, RAD51D*, RECQL, SMAD4, SMARCA4, STK11 and TP53* (sequencing and deletion/duplication); HOXB13, POLD1 and POLE (sequencing only); EPCAM and GREM1 (deletion/duplication only). DNA and RNA analyses performed for * genes.    05/16/2019 Tumor Marker   Patient's tumor was tested for the following markers: CA-125 Results of the tumor marker test revealed 10.7   06/16/2019 Imaging   1. No evidence of residual or recurrent metastatic disease in the chest, abdomen or pelvis. 2. Masslike focus of consolidation in the right middle lobe along the major and minor fissures with associated volume loss is stable since 01/31/2019. Indolent primary pulmonary neoplasm not excluded. PET-CT may be considered for further characterization. 3. Aortic Atherosclerosis (ICD10-I70.0).   06/16/2019 Tumor Marker   Patient's tumor was tested for the following markers: CA-125 Results of the tumor marker test revealed 9.1.   12/05/2019 Tumor Marker   Patient's  tumor was tested for the following markers: CA-125. Results of the tumor marker test revealed 9.0.   02/29/2020 Tumor Marker   Patient's tumor was tested for the following markers:  CA-`125 Results of the tumor marker test revealed 9.7.   05/28/2020 Tumor Marker   Patient's tumor was tested for the following markers: CA-125 Results of the tumor marker test revealed 9.5   08/16/2020 Tumor Marker   Patient's tumor was tested for the following markers: CA-125 Results of the tumor marker test revealed 10.9   11/29/2020 Tumor Marker   Patient's tumor was tested for the following markers: CA-125. Results of the tumor marker test revealed 10.2.   01/22/2021 Pathology Results   SURGICAL PATHOLOGY  CASE: [phone]  PATIENT: CLARITA REEF  Surgical Pathology Report   Clinical History: brain metastasis (cm)    FINAL MICROSCOPIC DIAGNOSIS:   A. 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    03/20/2021 Imaging   1. Unchanged enlarged midline superior mediastinal and left retroperitoneal lymph nodes, which remain suspicious for metastatic disease. 2. Unchanged masslike consolidation and volume loss of the right middle lobe measuring 3.8 x 1.9 cm, likely chronic scarring. Continued attention on follow-up. 3. No new evidence of metastatic disease in the chest, abdomen, or pelvis. 4. Status post hysterectomy and omentectomy.   Aortic Atherosclerosis (ICD10-I70.0).     03/21/2021 - 05/07/2021 Chemotherapy   Patient is on Treatment Plan : OVARIAN RECURRENT 3RD LINE Carboplatin  D1 / Gemcitabine  D1,8 (4/800) q21d     05/17/2021 Imaging   1. No new suspicious mass or lymphadenopathy identified in the chest, abdomen or pelvis. 2. Interval decreased size of previous enlarged superior mediastinal and left retroperitoneal lymph nodes. 3. Other ancillary findings as described.   05/21/2021 - 08/13/2021 Chemotherapy   Patient is on Treatment Plan : OVARIAN Gemcitabine  D1,8 q21d      08/16/2021 Imaging   MRI brain  1. Regression of disease. Resolved  marginal enhancement at the right middle frontal gyrus resection cavity and regressed enhancement at the treated left parietal lesion.   2. No new metastatic disease or acute intracranial abnormality.   08/19/2021 Imaging   Slight decrease in size of small left retroperitoneal and superior mediastinal lymph nodes.   No new or progressive disease within the chest, abdomen, or pelvis.   Aortic Atherosclerosis (ICD10-I70.0).     11/29/2021 Imaging   1. Slightly decreased size of the small left retroperitoneal lymph node with stable superior mediastinal lymph node. 2. No new or progressive disease within the chest, abdomen or pelvis. 3.  Aortic Atherosclerosis (ICD10-I70.0).     03/27/2022 Imaging   1. No current findings of active malignancy. 2. Stable chronic atelectasis of the right middle lobe. 3. Mild aortoiliac atherosclerotic vascular calcification. 4. Degenerative subcortical cystic lesion along the left acetabulum. 5. Old healed left posterior sixth and seventh rib fractures. 6. Aortic atherosclerosis.   07/22/2022 Imaging   CT CHEST ABDOMEN PELVIS W CONTRAST  Result Date: 07/21/2022 CLINICAL DATA:  Restaging ovarian cancer.  * Tracking Code: BO *. EXAM: CT CHEST, ABDOMEN, AND PELVIS WITH CONTRAST TECHNIQUE: Multidetector CT imaging of the chest, abdomen and pelvis was performed following the standard protocol during bolus administration of intravenous contrast. RADIATION DOSE REDUCTION: This exam was performed according to the departmental dose-optimization program which includes automated exposure control, adjustment of the mA and/or kV according to  patient size and/or use of iterative reconstruction technique. CONTRAST:  80mL OMNIPAQUE  IOHEXOL  300 MG/ML  SOLN COMPARISON:  03/25/2022 FINDINGS: CT CHEST FINDINGS Cardiovascular: Mild cardiac enlargement. No pericardial effusion. Aortic atherosclerotic calcifications. Mediastinum/Nodes: Thyroid  gland, trachea, and esophagus are  unremarkable. No enlarged mediastinal or hilar lymph nodes. No axillary adenopathy. Lungs/Pleura: No pleural fluid. Stable appearance of atelectasis involving the right middle lobe. No suspicious pulmonary nodules identified. Musculoskeletal: T3 vertebral hemangioma. No aggressive or acute osseous findings. Remote left posterior rib fractures. CT ABDOMEN PELVIS FINDINGS Hepatobiliary: No focal liver abnormality is seen. No gallstones, gallbladder wall thickening, or biliary dilatation. Pancreas: Unremarkable. No pancreatic ductal dilatation or surrounding inflammatory changes. Spleen: Normal in size without focal abnormality. Adrenals/Urinary Tract: Normal adrenal glands. No kidney mass or hydronephrosis identified. Urinary bladder is unremarkable. Stomach/Bowel: Stomach appears normal. No pathologic dilatation of the large or small bowel loops. No bowel wall thickening or inflammation. Vascular/Lymphatic: Aortic atherosclerosis. No aneurysm. New soft tissue nodule or lymph node along the gastrohepatic ligament measures 0.7 cm, image 61/2. Adjacent nodule is also new measuring 0.8 cm, image 60/2. The previously referenced right gastric lymph node measuring 8 mm is unchanged, image 66/3. No enlarged pelvic or inguinal lymph nodes. Reproductive: Status post hysterectomy. No adnexal masses. Other: Status post prior omentectomy. No ascites. No discrete fluid collections identified. Musculoskeletal: No acute or significant osseous findings. IMPRESSION: 1. New soft tissue nodules or lymph nodes along the gastrohepatic ligament are identified measuring up to 0.8 cm. Recurrent peritoneal disease or nodal disease cannot be excluded. 2. Stable appearance of previously referenced right gastric lymph node measuring 8 mm. 3. No signs of metastatic disease to the chest. 4.  Aortic Atherosclerosis (ICD10-I70.0). Electronically Signed   By: Waddell Calk M.D.   On: 07/21/2022 09:47      11/09/2022 Tumor Marker   Patient's tumor  was tested for the following markers: CA-125. Results of the tumor marker test revealed 8.2.   11/11/2022 Imaging   CT CHEST ABDOMEN PELVIS W CONTRAST  Result Date: 11/10/2022 CLINICAL DATA:  Ovarian cancer restaging.  * Tracking Code: BO *. EXAM: CT CHEST, ABDOMEN, AND PELVIS WITH CONTRAST TECHNIQUE: Multidetector CT imaging of the chest, abdomen and pelvis was performed following the standard protocol during bolus administration of intravenous contrast. RADIATION DOSE REDUCTION: This exam was performed according to the departmental dose-optimization program which includes automated exposure control, adjustment of the mA and/or kV according to patient size and/or use of iterative reconstruction technique. CONTRAST:  80mL OMNIPAQUE  IOHEXOL  300 MG/ML  SOLN COMPARISON:  None Available. FINDINGS: CT CHEST FINDINGS Cardiovascular: The heart size is normal. No substantial pericardial effusion. Right Port-A-Cath tip is positioned at the SVC/RA junction. Mediastinum/Nodes: No mediastinal lymphadenopathy. There is no hilar lymphadenopathy. The esophagus has normal imaging features. There is no axillary lymphadenopathy. Lungs/Pleura: Biapical pleuroparenchymal scarring evident. Right middle lobe collapse/consolidation is stable. No new suspicious pulmonary nodule or mass. Musculoskeletal: No worrisome lytic or sclerotic osseous abnormality. CT ABDOMEN PELVIS FINDINGS Hepatobiliary: No suspicious focal abnormality within the liver parenchyma. There is no evidence for gallstones, gallbladder wall thickening, or pericholecystic fluid. No intrahepatic or extrahepatic biliary dilation. Pancreas: No focal mass lesion. No dilatation of the main duct. No intraparenchymal cyst. No peripancreatic edema. Spleen: No splenomegaly. No suspicious focal mass lesion. Adrenals/Urinary Tract: No adrenal nodule or mass. Kidneys unremarkable. No evidence for hydroureter. The urinary bladder appears normal for the degree of distention.  Stomach/Bowel: Stomach is unremarkable. No gastric wall thickening. No evidence of outlet obstruction.  Duodenum is normally positioned as is the ligament of Treitz. No small bowel wall thickening. No small bowel dilatation. The terminal ileum is normal. The appendix is normal. No gross colonic mass. No colonic wall thickening. Vascular/Lymphatic: There is mild atherosclerotic calcification of the abdominal aorta without aneurysm. 8 mm short axis lymph nodes in the gastrohepatic ligament (images 59 in 60 of series 2 are stable in the interval. Small left para-aortic lymph nodes are stable. No retroperitoneal or mesenteric lymphadenopathy. No pelvic sidewall lymphadenopathy. Reproductive: Hysterectomy.  There is no adnexal mass. Other: No intraperitoneal free fluid. Musculoskeletal: No worrisome lytic or sclerotic osseous abnormality. IMPRESSION: 1. Stable exam. No new or progressive findings to suggest recurrent or metastatic disease in the chest, abdomen, or pelvis. 2. Small lymph nodes in the gastrohepatic ligament identified previously are stable in the interval. Continued attention on follow-up recommended. 3. Stable right middle lobe collapse/consolidation. 4.  Aortic Atherosclerosis (ICD10-I70.0). Electronically Signed   By: Camellia Candle M.D.   On: 11/10/2022 14:58      03/24/2023 Imaging   CT CHEST ABDOMEN PELVIS W CONTRAST Result Date: 03/27/2023 CLINICAL DATA:  Staging ovarian cancer.  * Tracking Code: BO *. EXAM: CT CHEST, ABDOMEN, AND PELVIS WITH CONTRAST TECHNIQUE: Multidetector CT imaging of the chest, abdomen and pelvis was performed following the standard protocol during bolus administration of intravenous contrast. RADIATION DOSE REDUCTION: This exam was performed according to the departmental dose-optimization program which includes automated exposure control, adjustment of the mA and/or kV according to patient size and/or use of iterative reconstruction technique. CONTRAST:  OMNIPAQUE   IOHEXOL  300 MG/ML  SOLN COMPARISON:  11/06/2022 and older. FINDINGS: CT CHEST FINDINGS Cardiovascular: Stable mild cardiac enlargement. Trace pericardial fluid. The thoracic aorta has a normal course and caliber. Slight intimal thickening and plaque. Right upper chest port is accessed with the tip extending to the upper right atrium. Prominent pulmonary arteries. Mediastinum/Nodes: Preserved thyroid  gland. Slightly patulous thoracic esophagus. No specific abnormal lymph node enlargement seen in the axillary region, hilum mediastinum. Small right anterior cardiophrenic angle node on series 2, image 50 is similar to previous measuring 5 mm in short axis. Lungs/Pleura: There is some linear opacity seen along bases likely scar or atelectasis. No pneumothorax or effusion. Stable juxtapleural left lower lobe 3 mm lung nodule on series 6, image 115. There is bandlike atelectasis seen along the posterosuperior aspect of the middle lobe. Also unchanged from previous. Minimal dependent atelectasis as well elsewhere. Musculoskeletal: No chest wall mass or suspicious bone lesions identified. CT ABDOMEN PELVIS FINDINGS Hepatobiliary: No focal liver abnormality is seen. Fatty liver infiltration diffusely. No gallstones, gallbladder wall thickening, or biliary dilatation. Pancreas: Unremarkable. No pancreatic ductal dilatation or surrounding inflammatory changes. Spleen: Normal in size without focal abnormality. Adrenals/Urinary Tract: Adrenal glands are unremarkable. Kidneys are normal, without renal calculi, focal lesion, or hydronephrosis. Bladder is unremarkable. Stomach/Bowel: Stomach is within normal limits. Appendix appears normal. No evidence of bowel wall thickening, distention, or inflammatory changes. Vascular/Lymphatic: Aortic atherosclerosis. Circumaortic renal vein. Abnormal nodes again seen in the upper abdomen.One is slightly larger now measuring 12 mm on image 62 of series 2. Previously 8 mm. Second small node  just medial and superior to the first is similar at 8 mm on image 60. No new lymph node enlargement elsewhere. Few nonpathologic retroperitoneal nodes more caudal near the aortic bifurcation. Reproductive: Status post hysterectomy. No adnexal masses. Other: No abdominal wall hernia or abnormality. No abdominopelvic ascites. Musculoskeletal: No acute or significant osseous findings. IMPRESSION:  Upper abdominal abnormal nodes are again seen. One is slightly larger today than previous. No new nodal enlargement. No developing ascites. Fatty liver infiltration. Stable segmental collapse of the middle lobe. Electronically Signed   By: Ranell Bring M.D.   On: 03/27/2023 13:53      03/26/2023 Tumor Marker   Patient's tumor was tested for the following markers: CA-125. Results of the tumor marker test revealed 8.9.     PHYSICAL EXAMINATION: ECOG PERFORMANCE STATUS: 1 - Symptomatic but completely ambulatory  Vitals:   03/31/23 0844  BP: 105/70  Pulse: 78  Resp: 18  Temp: 98.7 F (37.1 C)  SpO2: 99%   Filed Weights   03/31/23 0844  Weight: 98 lb 3.2 oz (44.5 kg)    GENERAL:alert, no distress and comfortable  LABORATORY DATA:  I have reviewed the data as listed    Component Value Date/Time   NA 137 03/24/2023 0732   K 3.7 03/24/2023 0732   CL 101 03/24/2023 0732   CO2 29 03/24/2023 0732   GLUCOSE 89 03/24/2023 0732   BUN 11 03/24/2023 0732   CREATININE 0.54 03/24/2023 0732   CREATININE 0.57 11/06/2022 0741   CALCIUM 9.5 03/24/2023 0732   PROT 7.4 03/24/2023 0732   ALBUMIN 4.5 03/24/2023 0732   AST 23 03/24/2023 0732   AST 33 11/06/2022 0741   ALT 16 03/24/2023 0732   ALT 25 11/06/2022 0741   ALKPHOS 61 03/24/2023 0732   BILITOT 0.7 03/24/2023 0732   BILITOT 0.5 11/06/2022 0741   GFRNONAA >60 03/24/2023 0732   GFRNONAA >60 11/06/2022 0741   GFRAA >60 12/05/2019 1145    No results found for: SPEP, UPEP  Lab Results  Component Value Date   WBC 6.2 03/24/2023    NEUTROABS 4.0 03/24/2023   HGB 12.0 03/24/2023   HCT 35.7 (L) 03/24/2023   MCV 89.0 03/24/2023   PLT 227 03/24/2023      Chemistry      Component Value Date/Time   NA 137 03/24/2023 0732   K 3.7 03/24/2023 0732   CL 101 03/24/2023 0732   CO2 29 03/24/2023 0732   BUN 11 03/24/2023 0732   CREATININE 0.54 03/24/2023 0732   CREATININE 0.57 11/06/2022 0741      Component Value Date/Time   CALCIUM 9.5 03/24/2023 0732   ALKPHOS 61 03/24/2023 0732   AST 23 03/24/2023 0732   AST 33 11/06/2022 0741   ALT 16 03/24/2023 0732   ALT 25 11/06/2022 0741   BILITOT 0.7 03/24/2023 0732   BILITOT 0.5 11/06/2022 0741       RADIOGRAPHIC STUDIES: I have personally reviewed the radiological images as listed and agreed with the findings in the report. CT CHEST ABDOMEN PELVIS W CONTRAST Result Date: 03/27/2023 CLINICAL DATA:  Staging ovarian cancer.  * Tracking Code: BO *. EXAM: CT CHEST, ABDOMEN, AND PELVIS WITH CONTRAST TECHNIQUE: Multidetector CT imaging of the chest, abdomen and pelvis was performed following the standard protocol during bolus administration of intravenous contrast. RADIATION DOSE REDUCTION: This exam was performed according to the departmental dose-optimization program which includes automated exposure control, adjustment of the mA and/or kV according to patient size and/or use of iterative reconstruction technique. CONTRAST:  OMNIPAQUE  IOHEXOL  300 MG/ML  SOLN COMPARISON:  11/06/2022 and older. FINDINGS: CT CHEST FINDINGS Cardiovascular: Stable mild cardiac enlargement. Trace pericardial fluid. The thoracic aorta has a normal course and caliber. Slight intimal thickening and plaque. Right upper chest port is accessed with the tip extending to the  upper right atrium. Prominent pulmonary arteries. Mediastinum/Nodes: Preserved thyroid  gland. Slightly patulous thoracic esophagus. No specific abnormal lymph node enlargement seen in the axillary region, hilum mediastinum. Small right  anterior cardiophrenic angle node on series 2, image 50 is similar to previous measuring 5 mm in short axis. Lungs/Pleura: There is some linear opacity seen along bases likely scar or atelectasis. No pneumothorax or effusion. Stable juxtapleural left lower lobe 3 mm lung nodule on series 6, image 115. There is bandlike atelectasis seen along the posterosuperior aspect of the middle lobe. Also unchanged from previous. Minimal dependent atelectasis as well elsewhere. Musculoskeletal: No chest wall mass or suspicious bone lesions identified. CT ABDOMEN PELVIS FINDINGS Hepatobiliary: No focal liver abnormality is seen. Fatty liver infiltration diffusely. No gallstones, gallbladder wall thickening, or biliary dilatation. Pancreas: Unremarkable. No pancreatic ductal dilatation or surrounding inflammatory changes. Spleen: Normal in size without focal abnormality. Adrenals/Urinary Tract: Adrenal glands are unremarkable. Kidneys are normal, without renal calculi, focal lesion, or hydronephrosis. Bladder is unremarkable. Stomach/Bowel: Stomach is within normal limits. Appendix appears normal. No evidence of bowel wall thickening, distention, or inflammatory changes. Vascular/Lymphatic: Aortic atherosclerosis. Circumaortic renal vein. Abnormal nodes again seen in the upper abdomen.One is slightly larger now measuring 12 mm on image 62 of series 2. Previously 8 mm. Second small node just medial and superior to the first is similar at 8 mm on image 60. No new lymph node enlargement elsewhere. Few nonpathologic retroperitoneal nodes more caudal near the aortic bifurcation. Reproductive: Status post hysterectomy. No adnexal masses. Other: No abdominal wall hernia or abnormality. No abdominopelvic ascites. Musculoskeletal: No acute or significant osseous findings. IMPRESSION: Upper abdominal abnormal nodes are again seen. One is slightly larger today than previous. No new nodal enlargement. No developing ascites. Fatty liver  infiltration. Stable segmental collapse of the middle lobe. Electronically Signed   By: Ranell Bring M.D.   On: 03/27/2023 13:53

## 2023-03-31 NOTE — Assessment & Plan Note (Signed)
 She has lung collapse noted on her prior imaging dated back to 2020 I reviewed multiple chest imaging with the patient which showed no changes to her lungs and with stability

## 2023-03-31 NOTE — Assessment & Plan Note (Signed)
 She was recently diagnosed with osteoporosis She is concerned about taking medications for this We discussed other lifestyle changes such as increase dietary protein intake and weightbearing exercise She appears motivated for lifestyle changes

## 2023-04-07 DIAGNOSIS — M810 Age-related osteoporosis without current pathological fracture: Secondary | ICD-10-CM | POA: Diagnosis not present

## 2023-04-07 DIAGNOSIS — E559 Vitamin D deficiency, unspecified: Secondary | ICD-10-CM | POA: Diagnosis not present

## 2023-04-07 DIAGNOSIS — Z23 Encounter for immunization: Secondary | ICD-10-CM | POA: Diagnosis not present

## 2023-04-15 ENCOUNTER — Ambulatory Visit: Payer: Medicare Other | Attending: Cardiology | Admitting: Cardiology

## 2023-04-15 ENCOUNTER — Encounter: Payer: Self-pay | Admitting: Cardiology

## 2023-04-15 VITALS — BP 122/70 | HR 73 | Ht 65.0 in | Wt 97.2 lb

## 2023-04-15 DIAGNOSIS — I493 Ventricular premature depolarization: Secondary | ICD-10-CM | POA: Diagnosis not present

## 2023-04-15 DIAGNOSIS — I059 Rheumatic mitral valve disease, unspecified: Secondary | ICD-10-CM | POA: Diagnosis not present

## 2023-04-15 DIAGNOSIS — I071 Rheumatic tricuspid insufficiency: Secondary | ICD-10-CM | POA: Insufficient documentation

## 2023-04-15 DIAGNOSIS — I351 Nonrheumatic aortic (valve) insufficiency: Secondary | ICD-10-CM | POA: Insufficient documentation

## 2023-04-15 DIAGNOSIS — I371 Nonrheumatic pulmonary valve insufficiency: Secondary | ICD-10-CM | POA: Insufficient documentation

## 2023-04-15 DIAGNOSIS — I447 Left bundle-branch block, unspecified: Secondary | ICD-10-CM | POA: Insufficient documentation

## 2023-04-15 DIAGNOSIS — I4719 Other supraventricular tachycardia: Secondary | ICD-10-CM | POA: Diagnosis not present

## 2023-04-15 DIAGNOSIS — Z136 Encounter for screening for cardiovascular disorders: Secondary | ICD-10-CM | POA: Diagnosis not present

## 2023-04-15 NOTE — Progress Notes (Addendum)
Cardiology Office Note:    Date:  04/15/2023   ID:  [name] [name], DOB Apr 03, 1950, MRN [id number]  PCP:  Joycelyn Rua, MD  Cardiologist:  Armanda Magic, MD   Electrophysiologist:  None   Referring MD: Joycelyn Rua, MD   Chief Complaint  Patient presents with   Follow-up    Mitral valve prolapse, MR, AI, TR, PVCs, PAT, left bundle branch block     History of Present Illness:    [name] [name] is a 73 y.o. female with: Paroxysmal atrial tachycardia PACs/PVCs Mitral valve prolapse LBBB GERD EF 50 to 55% Mild aortic insufficiency  [name] [name] is a 73yo female with a hx of PAT, PAC/sPVCs, LBBB and MVP.  Remote echocardiogram for SOB demonstrated an EF of 45-50%.  Myoview demonstrated no ischemia and  EF was normal in 2016.  2D echo 10/2021 showed EF 50 to 55% with abnormal septal motion due to left bundle branch block and PVCs.  There was trivial MR and mild AI with AVSC. Cor cal score was 0 in 05/2022.  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  She had been chemo but had to stop due to toxicity.  Followup head MRI and Abd and pelvic CT 03/2022 with no reoccurence. She has some residual dizziness from her brain surgery.    She is here today for followup and is doing well.  She denies any chest pain or pressure, SOB, DOE, PND, orthopnea, LE edema, or syncope. She occasionally has some mild dizziness but it is stable and is more of a feeling of being off balance. She occasionally will feel her PACs and PVCs.  She is compliant with her meds and is tolerating meds with no SE.    Prior CV studies:   The following studies were reviewed today:  Myoview 07/25/2014 Myocardial perfusion is abnormal. There is a moderate-sized fixed septal defect, secondary to LBBB. No reversible ischemia This is a low risk study. Overall left ventricular systolic function was normal. The left ventricular ejection  fraction is normal (55-65%). There is no prior study for comparison.  Echocardiogram 07/25/2014 EF 45-50  Echocardiogram 11/06/2021 -EF 50 to 55% with trivial MR and mild AR  Past Medical History:  Diagnosis Date   Allergic rhinitis    Aortic atherosclerosis (HCC)    Aortic insufficiency    Mild echo 2024   Aortic insufficiency    mild by echo 10/2021   Cervical dysplasia 1980   Cervicalgia    Eczema    Rosacea,dermatitis-Dr Stinehelfer   Esophageal reflux    Family history of colon cancer    Family history of melanoma    Family history of ovarian cancer    Family history of pancreatic cancer    Family history of prostate cancer    Family history of uterine cancer    Fibromyalgia    GERD (gastroesophageal reflux disease)    Glaucoma, narrow-angle    Heart murmur    Hiatal hernia    Irritable bowel syndrome    LBBB (left bundle branch block)    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), unspecified site    DCIS   Mass of lung    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 Edwyna Shell   Mitral valve disorders(424.0)    No MVP and trivial MR by echo 10/2021   Osteoporosis 12/2016   T score -2.3, 2014 T score -2.5 AP spine   PAC (premature atrial contraction) 2012   ,  PVC's, and nonsustained atril tachycardia w aberration by heart monitor    Pulmonic valve insufficiency    mild by echo 8/23   PVC (premature ventricular contraction)    Rosacea    Stricture and stenosis of esophagus    Tricuspid insufficiency    mild by echo 10/2021   Surgical Hx: The patient  has a past surgical history that includes Neck surgery (1610-9604); Rhinoplasty (1976); Tubal ligation (1977); Breast lumpectomy (1998); Refractive surgery; Cervical discectomy; Gynecologic cryosurgery (1980); IR IMAGING GUIDED PORT INSERTION (12/24/2018); Omentectomy (02/17/2019); sterotactic large core needle biopsy right breast (Right, 10/15/1996); Craniotomy (N/A, 01/22/2021); and Application of cranial navigation (N/A, 01/22/2021).    Current Medications: Current Meds  Medication Sig   Azelaic Acid 15 % cream Apply 1 application topically daily.   Calcium Carb-Cholecalciferol (CALCIUM 600 + D PO) Take 600 mg by mouth daily. Calcium 600 mg with Vitamin D3 800 units   cholecalciferol (VITAMIN D3) 25 MCG (1000 UNIT) tablet Take 2,000 Units by mouth daily.   Cyanocobalamin (VITAMIN B 12) 500 MCG TABS Take 500 mcg by mouth daily.   gabapentin (NEURONTIN) 300 MG capsule Take 300 mg by mouth 3 (three) times daily.   levETIRAcetam (KEPPRA) 500 MG tablet TAKE 2 TABLETS(1000 MG) BY MOUTH TWICE DAILY   lidocaine-prilocaine (EMLA) cream Apply to affected area(s) as needed.   LORazepam (ATIVAN) 2 MG tablet Take 1 tablet (2 mg total) by mouth every 6 (six) hours as needed for seizure.   Magnesium 250 MG TABS Take 250 mg by mouth daily as needed.     Allergies:   Carboplatin, Fentanyl, Vancomycin, Milk (cow), Brimonidine tartrate, Brimonidine tartrate, Ditropan [oxybutynin], Milk-related compounds, Other, Oxybutynin chloride, Thimerosal (thiomersal), Tramadol, Avelox [moxifloxacin hcl in nacl], Doxycycline, Erythromycin, Metronidazole, Minocycline, Penicillins, Quinolones, and Sulfonamide derivatives   Social History   Tobacco Use   Smoking status: Never   Smokeless tobacco: Never  Vaping Use   Vaping status: Never Used  Substance Use Topics   Alcohol use: Yes    Alcohol/week: 2.0 standard drinks of alcohol    Types: 2 Standard drinks or equivalent per week    Comment: Social    Drug use: No     Family Hx: The patient's family history includes Aneurysm in her father; Cancer in her paternal uncle; Cancer (age of onset: 48) in her maternal grandfather; Depression in her mother and sister; Heart disease in her father, mother, and paternal grandfather; Hypertension in her father, mother, and sister; Melanoma (age of onset: 50) in her sister; Melanoma (age of onset: 31) in her sister; Ovarian cancer (age of onset: 73) in her  sister; Stroke in her maternal grandmother and paternal grandmother; Uterine cancer (age of onset: 62) in her sister. There is no history of Colon cancer.  ROS:   Please see the history of present illness.    ROS All other systems reviewed and are negative.   EKGs/Labs/Other Test Reviewed:    EKG Interpretation Date/Time:  Wednesday April 15 2023 09:36:50 EST Ventricular Rate:  73 PR Interval:  156 QRS Duration:  114 QT Interval:  424 QTC Calculation: 467 R Axis:   -68  Text Interpretation: Normal sinus rhythm Left axis deviation Incomplete left bundle branch block Minimal voltage criteria for LVH, may be normal variant ( Cornell product ) Anterior infarct age undetermined Possible Lateral infarct , age undetermined When compared with ECG of 20-Apr-2008 14:33, the incomplete LBBB is new Confirmed by Armanda Magic [phone]) on 04/15/2023 9:50:21 AM  >>compared to  EKG 03/2022 the incomplete LBBB is unchanged and inferior T wave abnormality has improved  Recent Labs: 03/24/2023: ALT 16; BUN 11; Creatinine, Ser 0.54; Hemoglobin 12.0; Platelets 227; Potassium 3.7; Sodium 137   Recent Lipid Panel Lab Results  Component Value Date/Time   CHOL 223 (H) 04/11/2022 09:14 AM   TRIG 52 04/11/2022 09:14 AM   HDL 97 04/11/2022 09:14 AM   CHOLHDL 2.3 04/11/2022 09:14 AM   LDLCALC 117 (H) 04/11/2022 09:14 AM    Physical Exam:    VS:  BP 122/70 (BP Location: Left Arm, Patient Position: Sitting, Cuff Size: Small)   Pulse 73   Ht 5\' 5"  (1.651 m)   Wt 97 lb 3.2 oz (44.1 kg)   BMI 16.17 kg/m     Wt Readings from Last 3 Encounters:  04/15/23 97 lb 3.2 oz (44.1 kg)  03/31/23 98 lb 3.2 oz (44.5 kg)  02/05/23 97 lb 9.6 oz (44.3 kg)    GEN: Well nourished, well developed in no acute distress HEENT: Normal NECK: No JVD; No carotid bruits LYMPHATICS: No lymphadenopathy CARDIAC:RRR, no murmurs, rubs, gallops RESPIRATORY:  Clear to auscultation without rales, wheezing or rhonchi  ABDOMEN: Soft,  non-tender, non-distended MUSCULOSKELETAL:  No edema; No deformity  SKIN: Warm and dry NEUROLOGIC:  Alert and oriented x 3 PSYCHIATRIC:  Normal affect  ASSESSMENT & PLAN:     Mitral valve disorder -2D echo 2023 showed no MVP and trivial MR -2D echo from 04/2022 showed EF 50 to 55% with trivial MR and no MVP -She does not require antibiotic prophylaxis.  PVC (premature ventricular contraction)/PAT -Her palpitations are very stable and have not required any medication for suppression  Incomplete LBBB (left bundle branch block) -this is chronic on EKG -no ischemia on Leixscan myoview in 2016 -She denies any anginal symptoms of chest pain or shortness of breath -EF on echo 10/2021 showed EF 50 to 55% in the setting of abnormal septal wall motion from left bundle branch block -Review of 2D echo 04/2022 showed EF 50 to 55% with G1 DD  Aortic/Pulmonic and Tricuspid insufficiency -2D echo 04/2022 showed mild AI and trivial MR and TR>>this is all stable compared to echo 2023 -she has a repeat echo next month  Dispo:  Followup with me in 1 year  Medication Adjustments/Labs and Tests Ordered: Current medicines are reviewed at length with the patient today.  Concerns regarding medicines are outlined above.  Tests Ordered: Orders Placed This Encounter  Procedures   EKG 12-Lead   Medication Changes: No orders of the defined types were placed in this encounter.   Signed, Armanda Magic, MD  04/15/2023 9:54 AM    [facility] [address] [place], [place], [place]  [address] Phone: [phone]; Fax: [phone]

## 2023-04-15 NOTE — Patient Instructions (Signed)
Medication Instructions:   *If you need a refill on your cardiac medications before your next appointment, please call your pharmacy*   Lab Work:  If you have labs (blood work) drawn today and your tests are completely normal, you will receive your results only by: MyChart Message (if you have MyChart) OR A paper copy in the mail If you have any lab test that is abnormal or we need to change your treatment, we will call you to review the results.   Testing/Procedures:    Follow-Up: At [facility], you and your health needs are our priority.  As part of our continuing mission to provide you with exceptional heart care, we have created designated Provider Care Teams.  These Care Teams include your primary Cardiologist (physician) and Advanced Practice Providers (APPs -  Physician Assistants and Nurse Practitioners) who all work together to provide you with the care you need, when you need it.  We recommend signing up for the patient portal called "MyChart".  Sign up information is provided on this After Visit Summary.  MyChart is used to connect with patients for Virtual Visits (Telemedicine).  Patients are able to view lab/test results, encounter notes, upcoming appointments, etc.  Non-urgent messages can be sent to your provider as well.   To learn more about what you can do with MyChart, go to [email or web address].    Your next appointment:  ONE YEAR

## 2023-05-05 ENCOUNTER — Other Ambulatory Visit (HOSPITAL_COMMUNITY): Payer: Medicare Other

## 2023-05-06 ENCOUNTER — Ambulatory Visit (HOSPITAL_COMMUNITY): Payer: Medicare Other | Attending: Cardiology

## 2023-05-06 ENCOUNTER — Encounter: Payer: Self-pay | Admitting: Cardiology

## 2023-05-06 DIAGNOSIS — I341 Nonrheumatic mitral (valve) prolapse: Secondary | ICD-10-CM

## 2023-05-06 LAB — ECHOCARDIOGRAM COMPLETE
Area-P 1/2: 2.83 cm2
P 1/2 time: 574 ms
S' Lateral: 2.3 cm

## 2023-05-07 ENCOUNTER — Ambulatory Visit (HOSPITAL_COMMUNITY): Payer: Medicare Other

## 2023-05-08 ENCOUNTER — Other Ambulatory Visit: Payer: Self-pay | Admitting: Internal Medicine

## 2023-05-12 ENCOUNTER — Inpatient Hospital Stay: Payer: Medicare Other | Attending: Gynecologic Oncology

## 2023-05-12 VITALS — BP 118/57 | HR 76 | Temp 97.9°F | Resp 16

## 2023-05-12 DIAGNOSIS — C482 Malignant neoplasm of peritoneum, unspecified: Secondary | ICD-10-CM | POA: Diagnosis present

## 2023-05-12 DIAGNOSIS — Z452 Encounter for adjustment and management of vascular access device: Secondary | ICD-10-CM | POA: Diagnosis present

## 2023-05-12 MED ORDER — HEPARIN SOD (PORK) LOCK FLUSH 100 UNIT/ML IV SOLN
500.0000 [IU] | Freq: Once | INTRAVENOUS | Status: AC
  Administered 2023-05-12: 500 [IU]

## 2023-05-12 MED ORDER — SODIUM CHLORIDE 0.9% FLUSH
10.0000 mL | Freq: Once | INTRAVENOUS | Status: AC
  Administered 2023-05-12: 10 mL

## 2023-05-13 NOTE — Telephone Encounter (Signed)
 Called patient and advised results of echo. Pt agrees with plan of care and has no further questions at this time.

## 2023-05-14 DIAGNOSIS — Z23 Encounter for immunization: Secondary | ICD-10-CM | POA: Diagnosis not present

## 2023-06-09 DIAGNOSIS — E559 Vitamin D deficiency, unspecified: Secondary | ICD-10-CM | POA: Diagnosis not present

## 2023-06-09 DIAGNOSIS — Z23 Encounter for immunization: Secondary | ICD-10-CM | POA: Diagnosis not present

## 2023-06-29 ENCOUNTER — Inpatient Hospital Stay: Payer: Medicare Other | Attending: Gynecologic Oncology

## 2023-06-29 ENCOUNTER — Encounter (HOSPITAL_COMMUNITY): Payer: Self-pay

## 2023-06-29 ENCOUNTER — Ambulatory Visit (HOSPITAL_COMMUNITY)
Admission: RE | Admit: 2023-06-29 | Discharge: 2023-06-29 | Disposition: A | Discharge status: Home / Self Care | Payer: Medicare Other | Source: Ambulatory Visit | Attending: Hematology and Oncology | Admitting: Hematology and Oncology

## 2023-06-29 DIAGNOSIS — C482 Malignant neoplasm of peritoneum, unspecified: Secondary | ICD-10-CM | POA: Diagnosis not present

## 2023-06-29 DIAGNOSIS — C7951 Secondary malignant neoplasm of bone: Secondary | ICD-10-CM | POA: Insufficient documentation

## 2023-06-29 DIAGNOSIS — Z90710 Acquired absence of both cervix and uterus: Secondary | ICD-10-CM | POA: Diagnosis not present

## 2023-06-29 DIAGNOSIS — Z452 Encounter for adjustment and management of vascular access device: Secondary | ICD-10-CM | POA: Insufficient documentation

## 2023-06-29 LAB — COMPREHENSIVE METABOLIC PANEL WITH GFR
ALT: 17 U/L (ref 0–44)
AST: 25 U/L (ref 15–41)
Albumin: 4.5 g/dL (ref 3.5–5.0)
Alkaline Phosphatase: 63 U/L (ref 38–126)
Anion gap: 7 (ref 5–15)
BUN: 9 mg/dL (ref 8–23)
CO2: 29 mmol/L (ref 22–32)
Calcium: 9.5 mg/dL (ref 8.9–10.3)
Chloride: 102 mmol/L (ref 98–111)
Creatinine, Ser: 0.52 mg/dL (ref 0.44–1.00)
GFR, Estimated: >60 mL/min (ref >60)
Glucose, Bld: 90 mg/dL (ref 70–99)
Potassium: 3.9 mmol/L (ref 3.5–5.1)
Sodium: 138 mmol/L (ref 135–145)
Total Bilirubin: 0.7 mg/dL (ref 0.0–1.2)
Total Protein: 7.3 g/dL (ref 6.5–8.1)

## 2023-06-29 LAB — CBC WITH DIFFERENTIAL/PLATELET
Abs Immature Granulocytes: 0.01 10*3/uL (ref 0.00–0.07)
Basophils Absolute: 0.1 10*3/uL (ref 0.0–0.1)
Basophils Relative: 1 %
Eosinophils Absolute: 0.1 10*3/uL (ref 0.0–0.5)
Eosinophils Relative: 1 %
HCT: 35.1 % — ABNORMAL LOW (ref 36.0–46.0)
Hemoglobin: 12 g/dL (ref 12.0–15.0)
Immature Granulocytes: 0 %
Lymphocytes Relative: 31 %
Lymphs Abs: 1.7 10*3/uL (ref 0.7–4.0)
MCH: 30.5 pg (ref 26.0–34.0)
MCHC: 34.2 g/dL (ref 30.0–36.0)
MCV: 89.1 fL (ref 80.0–100.0)
Monocytes Absolute: 0.4 10*3/uL (ref 0.1–1.0)
Monocytes Relative: 7 %
Neutro Abs: 3.4 10*3/uL (ref 1.7–7.7)
Neutrophils Relative %: 60 %
Platelets: 224 10*3/uL (ref 150–400)
RBC: 3.94 MIL/uL (ref 3.87–5.11)
RDW: 12.8 % (ref 11.5–15.5)
WBC: 5.6 10*3/uL (ref 4.0–10.5)
nRBC: 0 % (ref 0.0–0.2)

## 2023-06-29 MED ORDER — HEPARIN SOD (PORK) LOCK FLUSH 100 UNIT/ML IV SOLN
500.0000 [IU] | Freq: Once | INTRAVENOUS | Status: AC
  Administered 2023-06-29: 500 [IU] via INTRAVENOUS

## 2023-06-29 MED ORDER — IOHEXOL 9 MG/ML PO SOLN
ORAL | Status: AC
  Filled 2023-06-29: qty 500

## 2023-06-29 MED ORDER — IOHEXOL 300 MG/ML  SOLN
80.0000 mL | Freq: Once | INTRAMUSCULAR | Status: AC | PRN
  Administered 2023-06-29: 80 mL via INTRAVENOUS

## 2023-06-29 MED ORDER — IOHEXOL 9 MG/ML PO SOLN
1000.0000 mL | ORAL | Status: AC
  Administered 2023-06-29: 1000 mL via ORAL

## 2023-06-29 MED ORDER — SODIUM CHLORIDE (PF) 0.9 % IJ SOLN
INTRAMUSCULAR | Status: AC
  Filled 2023-06-29: qty 50

## 2023-06-29 MED ORDER — SODIUM CHLORIDE 0.9% FLUSH
10.0000 mL | Freq: Once | INTRAVENOUS | Status: AC
  Administered 2023-06-29: 10 mL

## 2023-06-29 MED ORDER — HEPARIN SOD (PORK) LOCK FLUSH 100 UNIT/ML IV SOLN
INTRAVENOUS | Status: AC
  Filled 2023-06-29: qty 5

## 2023-06-30 LAB — CA 125: Cancer Antigen (CA) 125: 8.1 U/mL (ref 0.0–38.1)

## 2023-07-07 ENCOUNTER — Telehealth: Payer: Self-pay

## 2023-07-07 ENCOUNTER — Encounter: Payer: Self-pay | Admitting: Hematology and Oncology

## 2023-07-07 ENCOUNTER — Inpatient Hospital Stay (HOSPITAL_BASED_OUTPATIENT_CLINIC_OR_DEPARTMENT_OTHER): Payer: Medicare Other | Admitting: Hematology and Oncology

## 2023-07-07 VITALS — BP 121/59 | HR 79 | Temp 98.1°F | Resp 18 | Ht 65.0 in | Wt 95.2 lb

## 2023-07-07 DIAGNOSIS — C7931 Secondary malignant neoplasm of brain: Secondary | ICD-10-CM | POA: Diagnosis not present

## 2023-07-07 DIAGNOSIS — C7951 Secondary malignant neoplasm of bone: Secondary | ICD-10-CM | POA: Diagnosis not present

## 2023-07-07 DIAGNOSIS — C482 Malignant neoplasm of peritoneum, unspecified: Secondary | ICD-10-CM

## 2023-07-07 DIAGNOSIS — Z452 Encounter for adjustment and management of vascular access device: Secondary | ICD-10-CM | POA: Diagnosis not present

## 2023-07-07 DIAGNOSIS — R634 Abnormal weight loss: Secondary | ICD-10-CM | POA: Insufficient documentation

## 2023-07-07 NOTE — Assessment & Plan Note (Addendum)
 She will continue active surveillance as directed by neuro oncologist

## 2023-07-07 NOTE — Assessment & Plan Note (Addendum)
 She has unintentional weight loss through poor oral intake We discussed importance of increasing oral intake and nutritional supplement

## 2023-07-07 NOTE — Progress Notes (Signed)
 [facility] OFFICE PROGRESS NOTE  Patient Care Team: Wyn Heater, MD as PCP - General (Family Medicine) Jacqueline Matsu, MD as PCP - Cardiology (Cardiology)  Assessment & Plan Malignant tumor of peritoneum [facility]) The patient was originally diagnosed with primary peritoneal cancer in September 2020, status post neoadjuvant chemotherapy followed by surgery and completion of chemotherapy in March 2021 Pathology: Negative genetics, High grade serous, ER 70% PR 5%, PD-L1 CPS 1%  She developed metastatic disease to her brain in November 2022, status post craniotomy and resection, radiation therapy and chemotherapy. 04/30/21: She developed carboplatin  allergy, completed all chemotherapy by May 2023, with complete response with no evidence of disease   I have reviewed multiple imaging studies with the patient The area of abnormal lymph node of concern is no longer enlarged, indicative of benign nature Her tumor marker was normal She is not symptomatic With stability of her imaging for the past 2 years, I recommend spacing it out to every 9 months We discussed port removal but the patient would like to think about it We will focus on lifestyle changes and supportive care Secondary malignant neoplasm of brain [facility]) She will continue active surveillance as directed by neuro oncologist Weight loss, non-intentional She has unintentional weight loss through poor oral intake We discussed importance of increasing oral intake and nutritional supplement  Orders Placed This Encounter  Procedures   CT CHEST ABDOMEN PELVIS W CONTRAST    Standing Status:   Future    Expected Date:   03/31/2024    Expiration Date:   07/06/2024    If indicated for the ordered procedure, I authorize the administration of contrast media per Radiology protocol:   Yes    Does the patient have a contrast media/X-ray dye allergy?:   No    If indicated for the ordered procedure, I authorize the administration of  oral contrast media per Radiology protocol:   No    Reason for no oral contrast::   no need oral contrast    Preferred imaging location?:   [facility]     [name] Jacobs, MD  INTERVAL HISTORY: she returns for surveillance follow-up She is here accompanied by her husband We spent 45 minutes on reviewing blood work and imaging studies She is not symptomatic She has lost some weight due to inadequate oral intake, especially protein intake  PHYSICAL EXAMINATION: ECOG PERFORMANCE STATUS: 1 - Symptomatic but completely ambulatory  Vitals:   07/07/23 0903  BP: (!) 121/59  Pulse: 79  Resp: 18  Temp: 98.1 F (36.7 C)  SpO2: 100%   Filed Weights   07/07/23 0903  Weight: 95 lb 3.2 oz (43.2 kg)    Relevant data reviewed during this visit included CBC, CMP, CA125 and CT imaging from April 2025 in comparison with her imaging from January 2025 and 2020

## 2023-07-07 NOTE — Telephone Encounter (Signed)
 Returned her call. She is asking what type of CT would Dr. Marton Sleeper order the next time. Told her the office will find out and call her back.

## 2023-07-07 NOTE — Assessment & Plan Note (Addendum)
 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 04/30/21: She developed carboplatin  allergy, completed all chemotherapy by May 2023, with complete response with no evidence of disease   I have reviewed multiple imaging studies with the patient The area of abnormal lymph node of concern is no longer enlarged, indicative of benign nature Her tumor marker was normal She is not symptomatic With stability of her imaging for the past 2 years, I recommend spacing it out to every 9 months We discussed port removal but the patient would like to think about it We will focus on lifestyle changes and supportive care

## 2023-07-15 ENCOUNTER — Telehealth: Payer: Self-pay

## 2023-07-15 NOTE — Telephone Encounter (Signed)
 Pt called and LVM asking to speak with Cornelius Dill, RN and that she needed to "have a discussion with Cornelius Dill."   Called pt back and she states this is non-urgent and she prefers to speak with Cornelius Dill since she is "familiar with what is going on." Asked pt if there is something I can help with and she declined, asking for Cornelius Dill to call her when she is back in the office. This message forwarded to Cornelius Dill, Charity fundraiser.

## 2023-07-20 ENCOUNTER — Telehealth: Payer: Self-pay

## 2023-07-20 NOTE — Telephone Encounter (Signed)
 Returned her call. She has decided to keep her port until after the next CT scan and then she will decide if she wants to remove the port. Scheduled port flush appts q 8 weeks. She is aware of appts.

## 2023-07-21 ENCOUNTER — Telehealth: Payer: Self-pay

## 2023-07-21 NOTE — Telephone Encounter (Signed)
 Pt called stating she needs to schedule an appointment with Dr.Tucker for November (pt says she discussed with Dr.Gorsuch) last appointment 01/2022. Pt is scheduled on 01/29/24.   She has a concern and wants advice from Dr.Tucker,   About 4-7 weeks ago she started experiencing inflammation all in her vaginal area especially around the labia and urethra. It is red and swollen, tender when anything touches it. Getting worse instead of better. She is having to wear loose clothing. She has even noticed her urine stream is not as strong and fears if things get anymore swollen she will not be able to pee at all. Reports no UTI S&S (pain, pressure, burning or frequency), no odor/no discharge. No bleeding. Reports no trauma to area, nothing in the vagina. She states she has used Mupirocin  ointment, with short term relief.   Pt asking if Dr.Tucker can advise or does she need to see her PCP?

## 2023-07-21 NOTE — Telephone Encounter (Signed)
 Pt is scheduled to see Dr.Jackson-Moore tomorrow at 10:15

## 2023-07-22 ENCOUNTER — Inpatient Hospital Stay: Admitting: Obstetrics & Gynecology

## 2023-07-22 ENCOUNTER — Inpatient Hospital Stay: Attending: Gynecologic Oncology

## 2023-07-22 ENCOUNTER — Encounter: Payer: Self-pay | Admitting: Obstetrics & Gynecology

## 2023-07-22 VITALS — BP 128/63 | HR 88 | Temp 98.4°F | Resp 16 | Ht 65.0 in | Wt 95.4 lb

## 2023-07-22 DIAGNOSIS — N9089 Other specified noninflammatory disorders of vulva and perineum: Secondary | ICD-10-CM

## 2023-07-22 DIAGNOSIS — C482 Malignant neoplasm of peritoneum, unspecified: Secondary | ICD-10-CM

## 2023-07-22 DIAGNOSIS — Z808 Family history of malignant neoplasm of other organs or systems: Secondary | ICD-10-CM | POA: Diagnosis not present

## 2023-07-22 LAB — WET PREP, GENITAL
Clue Cells Wet Prep HPF POC: NONE SEEN
Sperm: NONE SEEN
Trich, Wet Prep: NONE SEEN
WBC, Wet Prep HPF POC: <10 (ref <10)
Yeast Wet Prep HPF POC: NONE SEEN

## 2023-07-22 NOTE — Progress Notes (Addendum)
 Follow Up Note: Gyn-Onc  Loyola Rummage 73 y.o. female  CC: Vulva "swelling"    HPI: Discussed the use of AI scribe software for clinical note transcription with the patient, who gave verbal consent to proceed.  History of Present Illness KELCY ALLREAD is a 73 year old female who presents with swelling and discomfort in the "urethral area".  Over the past six to eight weeks, she has experienced significant swelling and changes in the appearance of the area around her urethra, describing it as 'very different looking' and 'much bigger.' The swelling is inflamed and causes discomfort, particularly when wearing clothing that touches the area, leading her to wear her slacks lower on her waist.  She reports no incontinence of stool or urine but notes a decreased urine stream, which she attributes to the swelling. She is concerned about potential obstruction of urination due to the swelling. There is no discharge, and she has not changed her laundry detergent or body soap, continuing to use hypoallergenic unscented products.  She has not used baby wipes or moisturizers in the area but recently applied mupirocin  ointment, fearing it might block her urinary tract if applied too high. She describes the area as appearing 'red' and 'different than it ever looked before,' with the clitoris appearing larger and more sensitive, causing it to rub against clothing.  She is vigilant about changes in her genital area due to a family history of vulvar melanoma, as her sister died from this condition. She frequently examines the area to monitor for any signs of melanoma.      Review of Systems  Review of Systems  Constitutional:  Negative for malaise/fatigue and weight loss.  Respiratory:  Negative for shortness of breath and wheezing.   Cardiovascular:  Negative for chest pain and leg swelling.  Gastrointestinal:  Negative for abdominal pain, blood in stool, constipation, nausea and vomiting.   Genitourinary:  See above.  Musculoskeletal:  Negative for joint pain and myalgias.  Neurological:  Negative for weakness.  Psychiatric/Behavioral:  Negative for depression. The patient does not have insomnia.    Current medications, allergy, social history, past surgical history, past medical history, family history were all reviewed.    Vitals:  Blood pressure 128/63, pulse 88, temperature 98.4 F (36.9 C), temperature source Oral, resp. rate 16, height 5\' 5"  (1.651 m), weight 95 lb 6.4 oz (43.3 kg), SpO2 99%.  Physical Exam:  Physical Exam Exam conducted with a chaperone present.  Constitutional:      General: She is not in acute distress. Cardiovascular:     Rate and Rhythm: Normal rate and regular rhythm.  Pulmonary:     Effort: Pulmonary effort is normal.     Breath sounds: Normal breath sounds. No wheezing or rhonchi.  Abdominal:     Palpations: Abdomen is soft.     Tenderness: There is no abdominal tenderness. There is no right CVA tenderness or left CVA tenderness.     Hernia: No hernia is present.  Genitourinary:    General: Normal vulva. Minimal edema--clitoral hood, frenulum; ?prominent clitoris    Urethra: No urethral lesion.     Vagina: No lesions. No bleeding Musculoskeletal:     Cervical back: Neck supple.     Right lower leg: No edema.     Left lower leg: No edema.  Lymphadenopathy:     Upper Body:     Right upper body: No supraclavicular adenopathy.     Left upper body: No supraclavicular adenopathy.  Lower Body: No right inguinal adenopathy. No left inguinal adenopathy.  Skin:    Findings: No rash.  Neurological:     Mental Status: She is oriented to person, place, and time.   Assessment/Plan:  Acute vulva irritative sxs Clitoral swelling and tenderness for 6-8 weeks without lesions or significant swelling. Differential includes dermatitis, yeast infection.  - Perform wet prep for yeast infection. - Recommend Vaseline or Aquaphor for  moisture. - Advise loose-fitting, breathable clothing and cotton underwear. - Instruct to avoid soap, use water only. - Suggest warm water with Epsom salt soaks. - Consider topical estrogen cream if persistent. - Schedule follow-up if no improvement in a couple of months. Assessment & Plan Swelling and tenderness of clitoris Clitoral swelling and tenderness for 6-8 weeks without lesions or significant swelling. Differential includes irritation from trauma/dermatitis, lichenoid process, GSM, normal anatomic variation (age related) or yeast infection. No signs/sxs of androgen excess.  - Perform wet prep for yeast infection. - Recommend Vaseline or Aquaphor for moisture. - Advise loose-fitting, breathable clothing and cotton underwear. - Gentle skin care - Instruct to avoid soap, use water only. - Suggest warm water with Epsom salt soaks. - Education materials provided - Consider topical estrogen cream if persistent. - Schedule follow-up if no improvement in a couple of months.   I personally spent 25 minutes face-to-face and non-face-to-face in the care of this patient, which includes all pre, intra, and post visit time on the date of service.    Abdul Hodgkin, MD

## 2023-07-22 NOTE — Assessment & Plan Note (Signed)
 [name] [name] is a 73 y.o. 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 Therapy completed April, 2021. Complete clinical response.  Somatic and germline testing negative. Recurrence in 12/2020 with brain metastases, mediastinal and retroperitoneal adenopathy. S/p stereotactic right frontal craniotomy in 01/2021, palliative RT, and carbo/gemzar  completed in 07/2021.   Overall doing well and is NED on exam today.  She follows with Dr. Marton Sleeper as well as Dr. Mark Sil given side effects secondary to cancer recurrence and brain surgery.   She desires annual speculum examination due to her sister's history of vulvar melanoma.  We discussed continuing with regular follow-up every 6 months which will include a pelvic exam.  She follows regularly with a dermatologist.   CA-125 was not a marker for her recent recurrence.  We discussed that this lab could be followed still as it was a marker for her on presentation at the time of her initial diagnosis.  Since it was not drawn at the time of her port flush today, plan to get it when she comes in January.   Per NCCN surveillance recommendations, we will continue with visits every 3 months.  She sees Dr. Marton Sleeper in mid January.  I will plan to see her back in early April.  I stressed the importance of calling if she develops any new and concerning symptoms before her next visit.

## 2023-07-22 NOTE — Patient Instructions (Addendum)
 Healthy vulval hygiene practices Avoid Substitute  Clothing  Pantyhose Stockings with a garter belt Thigh-high or knee-high stockings   Synthetic underwear Cotton underwear or no underwear  Jeans and other tight pants Loose pants, skirts, dresses  Swimsuits, leotards, thongs, lycra garments Loose-fitting cotton garments  Cleansing products  Scented soaps or shampoos Fragrance-free pH neutral soap  Bubble bath Tub baths in the morning and at night without additives and at a comfortable temperature  Scented detergents Unscented detergents  Baby wipes or flushable wipes Rinse with water using sports water bottle or perineal irrigation bottle  Feminine sprays, douches, powders These are not necessary products and can be omitted from personal practices  Other  Washcloths Use fingertips for washing; pat dry, do not rub dry  Panty liners Tampons or cotton pads  Dyed toilet articles Toilet articles without dyes  Hair dryers to dry vulva skin without contact Dry vulva by gentle patting  Vulvar/Vaginal Moisturizers  Moisturizer Options: Vitamin E oil: pump or capsule form Vitamin E cream (Gene's vitamin E cream) Coconut oil: bottle or bead form Shea butter Blossom Organic Lubricant (organic and all natural; www.blossomorganics.com) PE suppository(coconut oil/vitamin E/palm oil) [employer] Aloe Glide      Consider the ingredients of the product - the fewer the ingredients the better!  Directions for Use: Clean and dry your hands Gently dab the vulvar/vaginal area dry as needed Apply a "pea-sized" amount of the moisturizer onto your fingertip Using you other hand, open the labia   Apply the moisturizer to the vulvar/vaginal tissues Wear loose fitting underwear/clothing if possible following application  Use moisturize 2-3 times daily as desired.   Return as needed or for scheduled visit with Dr. Orvil Bland.

## 2023-07-27 ENCOUNTER — Telehealth: Payer: Self-pay | Admitting: *Deleted

## 2023-07-27 NOTE — Telephone Encounter (Signed)
-----   Message from Suellyn Emory sent at 07/27/2023  2:28 PM EDT ----- Please let the patient know her wet prep from her visit with Dr. Nydia Belfast was normal.

## 2023-07-27 NOTE — Telephone Encounter (Signed)
 Spoke with [name] and relayed message from Vira Grieves, NP that patient's wet prep from her visit with Dr. Gaylin Ke is normal. Pt verbalized understanding and thanked the office for calling.

## 2023-08-04 ENCOUNTER — Ambulatory Visit
Admission: RE | Admit: 2023-08-04 | Discharge: 2023-08-04 | Disposition: A | Discharge status: Home / Self Care | Payer: Medicare Other | Source: Ambulatory Visit | Attending: Internal Medicine | Admitting: Internal Medicine

## 2023-08-04 DIAGNOSIS — C7931 Secondary malignant neoplasm of brain: Secondary | ICD-10-CM

## 2023-08-04 DIAGNOSIS — C569 Malignant neoplasm of unspecified ovary: Secondary | ICD-10-CM | POA: Diagnosis not present

## 2023-08-04 MED ORDER — GADOPICLENOL 0.5 MMOL/ML IV SOLN
5.0000 mL | Freq: Once | INTRAVENOUS | Status: AC | PRN
  Administered 2023-08-04: 5 mL via INTRAVENOUS

## 2023-08-11 ENCOUNTER — Inpatient Hospital Stay (HOSPITAL_BASED_OUTPATIENT_CLINIC_OR_DEPARTMENT_OTHER): Payer: Medicare Other | Admitting: Internal Medicine

## 2023-08-11 VITALS — BP 138/63 | HR 79 | Temp 98.4°F | Resp 18 | Wt 95.8 lb

## 2023-08-11 DIAGNOSIS — C7931 Secondary malignant neoplasm of brain: Secondary | ICD-10-CM

## 2023-08-11 DIAGNOSIS — N9089 Other specified noninflammatory disorders of vulva and perineum: Secondary | ICD-10-CM | POA: Diagnosis not present

## 2023-08-11 DIAGNOSIS — C482 Malignant neoplasm of peritoneum, unspecified: Secondary | ICD-10-CM | POA: Diagnosis not present

## 2023-08-11 DIAGNOSIS — Z808 Family history of malignant neoplasm of other organs or systems: Secondary | ICD-10-CM | POA: Diagnosis not present

## 2023-08-11 NOTE — Progress Notes (Signed)
 [facility] at [facility] [address]  [place], [place] [address] [phone]   Interval Evaluation  Date of Service: 08/11/23 Patient Name: [name] [name] Patient MRN: [id number] Patient DOB: [birth year] Provider: Mamie Searles, MD  Identifying Statement:  ISABEAU MCCALLA is a 73 y.o. 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 Findings highly suspicious for peritoneal carcinomatosis. Recommend paracentesis for therapeutic and diagnostic purposes. I do not see an obvious primary lesion but there is some irregular enhancing soft tissue in the right adnexal area. CA 125 level may be helpful. 2. Mild surface irregularity involving the liver but I do not see any obvious changes of cirrhosis. No hepatic lesions.   11/23/2018 Tumor Marker   Patient's tumor was tested for the following markers: CA-125 Results of the tumor marker test revealed 253.   11/25/2018 Initial Diagnosis   Peritoneal carcinoma (HCC)   11/25/2018 Imaging   US  paracentesis Successful ultrasound-guided paracentesis yielding 3.4 L of peritoneal fluid.   12/01/2018 Cancer Staging   Staging form: Ovary, Fallopian Tube, and Primary Peritoneal Carcinoma, AJCC 8th Edition - Clinical stage from 12/01/2018: Stage IVA (rcT3, cN0, pM1a) - Signed by Almeda Jacobs, MD on 02/14/2021 Stage prefix: Recurrence   12/02/2018 Tumor Marker   Patient's tumor was tested for the following markers: CA-125 Results of the tumor marker test revealed 267   12/06/2018 - 05/16/2019 Chemotherapy   The patient had carboplatin  and taxol  for chemotherapy treatment.     12/08/2018 Procedure   Successful ultrasound-guided therapeutic paracentesis  yielding 1.7 liters of peritoneal fluid.   12/24/2018 Procedure   Successful placement of a right IJ approach Power Port with ultrasound and fluoroscopic guidance. The catheter is ready for use   01/17/2019 Tumor Marker   Patient's tumor was tested for the following markers: CA-125 Results of the tumor marker test revealed 57.2   01/31/2019 Imaging   Significant decrease in peritoneal carcinomatosis since previous study. Interval resolution of ascites.   Focal area of parenchymal consolidation in central right middle lobe, which measures 3 cm. Differential diagnosis includes infectious or inflammatory process, atelectasis, and neoplasm. Recommend short-term follow-up by chest CT in 2-3 months.     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   Surgeon: Diania Fortes    Operative Findings:  : grossly normal uterus, ovaries normal, few scattered peritoneal nodules (1mm) on serosa of uterus and tubes. The omentum (gastrocolic) was tethered to the mesentery with tumor (thin rind). Complete (optimal) resection of tumor with no gross residual disease.     02/17/2019 Pathology Results   FINAL MICROSCOPIC DIAGNOSIS: A. UTERUS, CERVIX, BILATERAL FALLOPIAN TUBES AND OVARIES, HYSTERECTOMY WITH SALPINGOOOPHORECTOMY: - Uterus: Endometrium: Inactive endometrium. No hyperplasia or malignancy. Myometrium: Unremarkable. No malignancy. Serosa: Metastatic carcinoma. No malignancy. - Cervix: Benign squamous and endocervical mucosa. No dysplasia or malignancy. - Left ovary and fallopian tube: Metastatic carcinoma. - Right ovary: No malignancy identified. - Right fallopian tube: Luminal tumor, see comment. B. OMENTUM, RESECTION: - High grade serous  carcinoma. - Deposits  up to at least 3 cm. - See oncology table. ONCOLOGY TABLE: OVARY or FALLOPIAN TUBE or PRIMARY PERITONEUM: Procedure: Hysterectomy with bilateral salpingo-oophorectomy and omentectomy. Specimen Integrity: Intact Tumor Site: Peritoneum Ovarian Surface Involvement (required only if applicable): Left ovary Fallopian Tube Surface Involvement (required only if applicable): Left Fallopian tube Tumor Size: Largest deposit 3 cm Histologic Type: High-grade serous carcinoma Histologic Grade: High-grade Implants (required for advanced stage serous/seromucinous borderline tumors only): Uterine serosa, left fallopian tube and ovary, omentum Other Tissue/ Organ Involvement: As above Largest Extrapelvic Peritoneal Focus (required only if applicable): 3 cm Peritoneal/Ascitic Fluid: Pre neoadjuvant [id number] positive for carcinoma. Treatment Effect (required only for high-grade serous carcinomas): Probably partial treatment effect in omental tissue. Regional Lymph Nodes: No lymph nodes submitted or found Pathologic Stage Classification (pTNM, AJCC 8th Edition): ypT3c, ypNX Representative Tumor Block: B5 Comment(s): There is surface involvement of the left ovary and fallopian tube but no primary tumor. Within the right fallopian tube lumen there is detached fragments of tumor but again no precursor lesion is noted in the right fallopian tube. Thus, the tumor is presumed primary peritoneal and staged as such.   03/21/2019 Tumor Marker   Patient's tumor was tested for the following markers: CA-125 Results of the tumor marker test revealed 15.1    Genetic Testing   Negative genetic testing. No pathogenic variants identified on the [employer] CancerNext + RNAinsight panel. VUS in ATM called [id number] identified. The report date is 05/12/2019. TumorNext HRD was originally ordered but there was not enough sample to complete this testing.   The CancerNext+RNAinsight gene panel offered by [employer] includes sequencing and rearrangement analysis for the following 36 genes: APC*, ATM*, AXIN2, BARD1, BMPR1A, BRCA1*, BRCA2*, BRIP1*, CDH1*, CDK4, CDKN2A, CHEK2*, DICER1, MLH1*, MSH2*, MSH3, MSH6*, MUTYH*, NBN, NF1*, NTHL1, PALB2*, PMS2*, PTEN*, RAD51C*, RAD51D*, RECQL, SMAD4, SMARCA4, STK11 and TP53* (sequencing and deletion/duplication); HOXB13, POLD1 and POLE (sequencing only); EPCAM and GREM1 (deletion/duplication only). DNA and RNA analyses performed for * genes.    05/16/2019 Tumor Marker   Patient's tumor was tested for the following markers: CA-125 Results of the tumor marker test revealed 10.7   06/16/2019 Imaging   1. No evidence of residual or recurrent metastatic disease in the chest, abdomen or pelvis. 2. Masslike focus of consolidation in the right middle lobe along the major and minor fissures with associated volume loss is stable since 01/31/2019. Indolent primary pulmonary neoplasm not excluded. PET-CT may be considered for further characterization. 3. Aortic Atherosclerosis (ICD10-I70.0).   06/16/2019 Tumor Marker   Patient's tumor was tested for the following markers: CA-125 Results of the tumor marker test revealed 9.1.   12/05/2019 Tumor Marker   Patient's tumor was tested for the following markers: CA-125. Results of the tumor marker test revealed 9.0.   02/29/2020 Tumor Marker   Patient's tumor was tested for the following markers: CA-`125 Results of the tumor marker test revealed 9.7.   05/28/2020 Tumor Marker   Patient's tumor was tested for the following markers: CA-125 Results of the tumor marker test revealed 9.5   08/16/2020 Tumor Marker   Patient's tumor was tested for the following markers: CA-125 Results of the tumor marker test revealed 10.9   11/29/2020 Tumor Marker   Patient's tumor was tested for the following markers: CA-125. Results of the tumor marker test revealed 10.2.   01/22/2021 Pathology Results   SURGICAL PATHOLOGY  CASE: [phone]   PATIENT: Austine Blunt  Surgical Pathology Report   Clinical History: brain metastasis (cm)  FINAL MICROSCOPIC DIAGNOSIS:   A. 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    03/20/2021 Imaging   1. Unchanged enlarged midline superior mediastinal and left retroperitoneal lymph nodes, which remain suspicious for metastatic disease. 2. Unchanged masslike consolidation and volume loss of the right middle lobe measuring 3.8 x 1.9 cm, likely chronic scarring. Continued attention on follow-up. 3. No new evidence of metastatic disease in the chest, abdomen, or pelvis. 4. Status post hysterectomy and omentectomy.   Aortic Atherosclerosis (ICD10-I70.0).     03/21/2021 - 05/07/2021 Chemotherapy   Patient is on Treatment Plan : OVARIAN RECURRENT 3RD LINE Carboplatin  D1 / Gemcitabine  D1,8 (4/800) q21d     05/17/2021 Imaging   1. No new suspicious mass or lymphadenopathy identified in the chest, abdomen or pelvis. 2. Interval decreased size of previous enlarged superior mediastinal and left retroperitoneal lymph nodes. 3. Other ancillary findings as described.   05/21/2021 - 08/13/2021 Chemotherapy   Patient is on Treatment Plan : OVARIAN Gemcitabine  D1,8 q21d      08/16/2021 Imaging   MRI brain  1. Regression of disease. Resolved marginal enhancement at the right middle frontal gyrus resection cavity and regressed enhancement at the treated left parietal lesion.   2. No new metastatic disease or acute intracranial abnormality.   08/19/2021 Imaging   Slight decrease in size of small left retroperitoneal and superior mediastinal lymph nodes.   No new or progressive disease within the chest, abdomen, or pelvis.   Aortic Atherosclerosis (ICD10-I70.0).     11/29/2021 Imaging   1. Slightly decreased size of the small left retroperitoneal lymph node with stable  superior mediastinal lymph node. 2. No new or progressive disease within the chest, abdomen or pelvis. 3.  Aortic Atherosclerosis (ICD10-I70.0).     03/27/2022 Imaging   1. No current findings of active malignancy. 2. Stable chronic atelectasis of the right middle lobe. 3. Mild aortoiliac atherosclerotic vascular calcification. 4. Degenerative subcortical cystic lesion along the left acetabulum. 5. Old healed left posterior sixth and seventh rib fractures. 6. Aortic atherosclerosis.   07/22/2022 Imaging   CT CHEST ABDOMEN PELVIS W CONTRAST  Result Date: 07/21/2022 CLINICAL DATA:  Restaging ovarian cancer.  * Tracking Code: BO *. EXAM: CT CHEST, ABDOMEN, AND PELVIS WITH CONTRAST TECHNIQUE: Multidetector CT imaging of the chest, abdomen and pelvis was performed following the standard protocol during bolus administration of intravenous contrast. RADIATION DOSE REDUCTION: This exam was performed according to the departmental dose-optimization program which includes automated exposure control, adjustment of the mA and/or kV according to patient size and/or use of iterative reconstruction technique. CONTRAST:  80mL OMNIPAQUE  IOHEXOL  300 MG/ML  SOLN COMPARISON:  03/25/2022 FINDINGS: CT CHEST FINDINGS Cardiovascular: Mild cardiac enlargement. No pericardial effusion. Aortic atherosclerotic calcifications. Mediastinum/Nodes: Thyroid  gland, trachea, and esophagus are unremarkable. No enlarged mediastinal or hilar lymph nodes. No axillary adenopathy. Lungs/Pleura: No pleural fluid. Stable appearance of atelectasis involving the right middle lobe. No suspicious pulmonary nodules identified. Musculoskeletal: T3 vertebral hemangioma. No aggressive or acute osseous findings. Remote left posterior rib fractures. CT ABDOMEN PELVIS FINDINGS Hepatobiliary: No focal liver abnormality is seen. No gallstones, gallbladder wall thickening, or biliary dilatation. Pancreas: Unremarkable. No pancreatic ductal dilatation or  surrounding inflammatory changes. Spleen: Normal in size without focal abnormality. Adrenals/Urinary Tract: Normal adrenal glands. No kidney mass or hydronephrosis identified. Urinary bladder is unremarkable. Stomach/Bowel: Stomach appears normal. No pathologic  dilatation of the large or small bowel loops. No bowel wall thickening or inflammation. Vascular/Lymphatic: Aortic atherosclerosis. No aneurysm. New soft tissue nodule or lymph node along the gastrohepatic ligament measures 0.7 cm, image 61/2. Adjacent nodule is also new measuring 0.8 cm, image 60/2. The previously referenced right gastric lymph node measuring 8 mm is unchanged, image 66/3. No enlarged pelvic or inguinal lymph nodes. Reproductive: Status post hysterectomy. No adnexal masses. Other: Status post prior omentectomy. No ascites. No discrete fluid collections identified. Musculoskeletal: No acute or significant osseous findings. IMPRESSION: 1. New soft tissue nodules or lymph nodes along the gastrohepatic ligament are identified measuring up to 0.8 cm. Recurrent peritoneal disease or nodal disease cannot be excluded. 2. Stable appearance of previously referenced right gastric lymph node measuring 8 mm. 3. No signs of metastatic disease to the chest. 4.  Aortic Atherosclerosis (ICD10-I70.0). Electronically Signed   By: Kimberley Penman M.D.   On: 07/21/2022 09:47      11/09/2022 Tumor Marker   Patient's tumor was tested for the following markers: CA-125. Results of the tumor marker test revealed 8.2.   11/11/2022 Imaging   CT CHEST ABDOMEN PELVIS W CONTRAST  Result Date: 11/10/2022 CLINICAL DATA:  Ovarian cancer restaging.  * Tracking Code: BO *. EXAM: CT CHEST, ABDOMEN, AND PELVIS WITH CONTRAST TECHNIQUE: Multidetector CT imaging of the chest, abdomen and pelvis was performed following the standard protocol during bolus administration of intravenous contrast. RADIATION DOSE REDUCTION: This exam was performed according to the departmental  dose-optimization program which includes automated exposure control, adjustment of the mA and/or kV according to patient size and/or use of iterative reconstruction technique. CONTRAST:  80mL OMNIPAQUE  IOHEXOL  300 MG/ML  SOLN COMPARISON:  None Available. FINDINGS: CT CHEST FINDINGS Cardiovascular: The heart size is normal. No substantial pericardial effusion. Right Port-A-Cath tip is positioned at the SVC/RA junction. Mediastinum/Nodes: No mediastinal lymphadenopathy. There is no hilar lymphadenopathy. The esophagus has normal imaging features. There is no axillary lymphadenopathy. Lungs/Pleura: Biapical pleuroparenchymal scarring evident. Right middle lobe collapse/consolidation is stable. No new suspicious pulmonary nodule or mass. Musculoskeletal: No worrisome lytic or sclerotic osseous abnormality. CT ABDOMEN PELVIS FINDINGS Hepatobiliary: No suspicious focal abnormality within the liver parenchyma. There is no evidence for gallstones, gallbladder wall thickening, or pericholecystic fluid. No intrahepatic or extrahepatic biliary dilation. Pancreas: No focal mass lesion. No dilatation of the main duct. No intraparenchymal cyst. No peripancreatic edema. Spleen: No splenomegaly. No suspicious focal mass lesion. Adrenals/Urinary Tract: No adrenal nodule or mass. Kidneys unremarkable. No evidence for hydroureter. The urinary bladder appears normal for the degree of distention. Stomach/Bowel: Stomach is unremarkable. No gastric wall thickening. No evidence of outlet obstruction. Duodenum is normally positioned as is the ligament of Treitz. No small bowel wall thickening. No small bowel dilatation. The terminal ileum is normal. The appendix is normal. No gross colonic mass. No colonic wall thickening. Vascular/Lymphatic: There is mild atherosclerotic calcification of the abdominal aorta without aneurysm. 8 mm short axis lymph nodes in the gastrohepatic ligament (images 59 in 60 of series 2 are stable in the interval.  Small left para-aortic lymph nodes are stable. No retroperitoneal or mesenteric lymphadenopathy. No pelvic sidewall lymphadenopathy. Reproductive: Hysterectomy.  There is no adnexal mass. Other: No intraperitoneal free fluid. Musculoskeletal: No worrisome lytic or sclerotic osseous abnormality. IMPRESSION: 1. Stable exam. No new or progressive findings to suggest recurrent or metastatic disease in the chest, abdomen, or pelvis. 2. Small lymph nodes in the gastrohepatic ligament identified previously are stable in the interval.  Continued attention on follow-up recommended. 3. Stable right middle lobe collapse/consolidation. 4.  Aortic Atherosclerosis (ICD10-I70.0). Electronically Signed   By: Donnal Fusi M.D.   On: 11/10/2022 14:58      03/24/2023 Imaging   CT CHEST ABDOMEN PELVIS W CONTRAST Result Date: 03/27/2023 CLINICAL DATA:  Staging ovarian cancer.  * Tracking Code: BO *. EXAM: CT CHEST, ABDOMEN, AND PELVIS WITH CONTRAST TECHNIQUE: Multidetector CT imaging of the chest, abdomen and pelvis was performed following the standard protocol during bolus administration of intravenous contrast. RADIATION DOSE REDUCTION: This exam was performed according to the departmental dose-optimization program which includes automated exposure control, adjustment of the mA and/or kV according to patient size and/or use of iterative reconstruction technique. CONTRAST:  OMNIPAQUE  IOHEXOL  300 MG/ML  SOLN COMPARISON:  11/06/2022 and older. FINDINGS: CT CHEST FINDINGS Cardiovascular: Stable mild cardiac enlargement. Trace pericardial fluid. The thoracic aorta has a normal course and caliber. Slight intimal thickening and plaque. Right upper chest port is accessed with the tip extending to the upper right atrium. Prominent pulmonary arteries. Mediastinum/Nodes: Preserved thyroid  gland. Slightly patulous thoracic esophagus. No specific abnormal lymph node enlargement seen in the axillary region, hilum mediastinum. Small right  anterior cardiophrenic angle node on series 2, image 50 is similar to previous measuring 5 mm in short axis. Lungs/Pleura: There is some linear opacity seen along bases likely scar or atelectasis. No pneumothorax or effusion. Stable juxtapleural left lower lobe 3 mm lung nodule on series 6, image 115. There is bandlike atelectasis seen along the posterosuperior aspect of the middle lobe. Also unchanged from previous. Minimal dependent atelectasis as well elsewhere. Musculoskeletal: No chest wall mass or suspicious bone lesions identified. CT ABDOMEN PELVIS FINDINGS Hepatobiliary: No focal liver abnormality is seen. Fatty liver infiltration diffusely. No gallstones, gallbladder wall thickening, or biliary dilatation. Pancreas: Unremarkable. No pancreatic ductal dilatation or surrounding inflammatory changes. Spleen: Normal in size without focal abnormality. Adrenals/Urinary Tract: Adrenal glands are unremarkable. Kidneys are normal, without renal calculi, focal lesion, or hydronephrosis. Bladder is unremarkable. Stomach/Bowel: Stomach is within normal limits. Appendix appears normal. No evidence of bowel wall thickening, distention, or inflammatory changes. Vascular/Lymphatic: Aortic atherosclerosis. Circumaortic renal vein. Abnormal nodes again seen in the upper abdomen.One is slightly larger now measuring 12 mm on image 62 of series 2. Previously 8 mm. Second small node just medial and superior to the first is similar at 8 mm on image 60. No new lymph node enlargement elsewhere. Few nonpathologic retroperitoneal nodes more caudal near the aortic bifurcation. Reproductive: Status post hysterectomy. No adnexal masses. Other: No abdominal wall hernia or abnormality. No abdominopelvic ascites. Musculoskeletal: No acute or significant osseous findings. IMPRESSION: Upper abdominal abnormal nodes are again seen. One is slightly larger today than previous. No new nodal enlargement. No developing ascites. Fatty liver  infiltration. Stable segmental collapse of the middle lobe. Electronically Signed   By: Adrianna Horde M.D.   On: 03/27/2023 13:53      03/26/2023 Tumor Marker   Patient's tumor was tested for the following markers: CA-125. Results of the tumor marker test revealed 8.9.   06/29/2023 Imaging   *No acute findings in the abdomen or pelvis. *Moderate amount of residual fecal material throughout the colon without obstruction or constipation. *Status post hysterectomy. No adnexal masses.      CNS Oncologic History 01/22/21: R frontal craniotomy, resection with Dr. Julane Ny 02/21/21: Post-op SRS and SRS to L parietal target Lorri Rota)  Interval History: AIREONA TORELLI presents for follow up. She does  describe several seizure "auras" characterized by left arm and face numbness, starting day of the MRI scan.  This seems to have tapered off in past couple of days.  Does have ongoing short term memory issues, fatigue.  Continues on Keppra  1000mg  twice per day.  Continues on observation with Dr. Marton Sleeper.  H+P (12/25/20) Patient presented to the ED on 12/22/20 with twitching of her left side of her face for 1 to 2 minutes, accompanied by slurring of speech.  There was no tongue biting, bowel or bladder incontinence.  Workup demonstrated two brain lesions consistent with suspected metastases.  She was started on Keppra  (no decadron ) and discharged two days later with no further seizure like events.  Priro to admission she had also been complaining of dizziness, confusion, impaired memory.  Has been seeing Dr. Marton Sleeper since 2021 for primary peritoneal carcinoma.   Medications: Current Outpatient Medications on File Prior to Visit  Medication Sig Dispense Refill   Azelaic Acid  15 % cream Apply 1 application topically daily.     Calcium Carb-Cholecalciferol (CALCIUM 600 + D PO) Take 600 mg by mouth daily. Calcium 600 mg with Vitamin D3 800 units     cholecalciferol (VITAMIN D3) 25 MCG (1000 UNIT) tablet Take  5,000 Units by mouth daily.     Cyanocobalamin (VITAMIN B 12) 500 MCG TABS Take 500 mcg by mouth daily.     gabapentin  (NEURONTIN ) 300 MG capsule Take 300 mg by mouth 3 (three) times daily.     levETIRAcetam  (KEPPRA ) 500 MG tablet TAKE 2 TABLETS(1000 MG) BY MOUTH TWICE DAILY 120 tablet 3   lidocaine -prilocaine  (EMLA ) cream Apply to affected area(s) as needed. 30 g 3   LORazepam  (ATIVAN ) 2 MG tablet Take 1 tablet (2 mg total) by mouth every 6 (six) hours as needed for seizure. 10 tablet 0   No current facility-administered medications on file prior to visit.    Allergies:  Allergies  Allergen Reactions   Carboplatin  Itching and Rash    Patient has a hypersensitivity reaction to carboplatin .  See note from 04/30/2021 at 1439.  Reaction symptoms included itching of mouth and ears, burning in chest, hypotension, and rash to neck and arms.  Patient unable to complete infusion.   Fentanyl  Palpitations    Palpitations , fever ,headahe, N/V  " loud pounding heart beat"    Vancomycin  Rash   Milk (Cow) Diarrhea and Nausea And Vomiting   Brimonidine Tartrate     Other reaction(s): blurred vision   Brimonidine Tartrate     Other reaction(s): blurred vision   Ditropan [Oxybutynin]    Milk-Related Compounds Diarrhea and Nausea And Vomiting   Other Other (See Comments)   Oxybutynin Chloride Other (See Comments)    Ditropan  Pt doesn't remember    Thimerosal (Thiomersal) Other (See Comments)    Pt doesn't remember   Tramadol Other (See Comments)    Pt doesn't remember   Avelox [Moxifloxacin Hcl In Nacl] Rash   Doxycycline Rash   Erythromycin Rash   Metronidazole Rash   Minocycline Rash   Penicillins Rash    Did it involve swelling of the face/tongue/throat, SOB, or low BP? No Did it involve sudden or severe rash/hives, skin peeling, or any reaction on the inside of your mouth or nose? Yes, rash. Did you need to seek medical attention at a hospital or doctor's office? No When did it last  happen?      Childhood allergy If all above answers are "NO", may proceed with  cephalosporin use.    Quinolones Rash   Sulfonamide Derivatives Rash   Past Medical History:  Past Medical History:  Diagnosis Date   Allergic rhinitis    Aortic atherosclerosis (HCC)    Aortic insufficiency    mild by echo 04/2023   Cervical dysplasia 1980   Cervicalgia    Eczema    Rosacea,dermatitis-Dr Stinehelfer   Esophageal reflux    Family history of colon cancer    Family history of melanoma    Family history of ovarian cancer    Family history of pancreatic cancer    Family history of prostate cancer    Family history of uterine cancer    Fibromyalgia    GERD (gastroesophageal reflux disease)    Glaucoma, narrow-angle    Heart murmur    Hiatal hernia    Irritable bowel syndrome    LBBB (left bundle branch block)    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), unspecified site    DCIS   Mass of lung    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 Percy Bracken   Mitral valve disorders(424.0)    No MVP and trivial MR by echo 10/2021   Osteoporosis 12/2016   T score -2.3, 2014 T score -2.5 AP spine   PAC (premature atrial contraction) 2012   ,PVC's, and nonsustained atril tachycardia w aberration by heart monitor    Pulmonic valve insufficiency    mild by echo 8/23   PVC (premature ventricular contraction)    Rosacea    Stricture and stenosis of esophagus    Tricuspid insufficiency    mild by echo 10/2021   Past Surgical History:  Past Surgical History:  Procedure Laterality Date   APPLICATION OF CRANIAL NAVIGATION N/A 01/22/2021   Procedure: APPLICATION OF CRANIAL NAVIGATION;  Surgeon: Pincus Bridgeman, DO;  Location: [facility] OR;  Service: Neurosurgery;  Laterality: N/A;   BREAST LUMPECTOMY  1998   right breast with radiation   CERVICAL DISCECTOMY     C3-7 with bone graft, in three different surgeries, have titanium plate and screws   CRANIOTOMY N/A 01/22/2021   Procedure: STEREOTACTIC RIGHT FRONTAL  CRANIOTOMY FOR RESECTION OF TUMOR;  Surgeon: Pincus Bridgeman, DO;  Location: [facility] OR;  Service: Neurosurgery;  Laterality: N/A;   GYNECOLOGIC CRYOSURGERY  1980   IR IMAGING GUIDED PORT INSERTION  12/24/2018   NECK SURGERY  1993-1997   bone graft and fusion   OMENTECTOMY  02/17/2019   REFRACTIVE SURGERY     narrow angle glucoma   RHINOPLASTY  1976   sterotactic large core needle biopsy right breast Right 10/15/1996   TUBAL LIGATION  1977   Social History:  Social History   Socioeconomic History   Marital status: Married    Spouse name: Debborah Fairly   Number of children: 0   Years of education: Not on file   Highest education level: Not on file  Occupational History   Occupation: Educational psychologist: [employer]  Tobacco Use   Smoking status: Never   Smokeless tobacco: Never  Vaping Use   Vaping status: Never Used  Substance and Sexual Activity   Alcohol use: Yes    Alcohol/week: 2.0 standard drinks of alcohol    Types: 2 Standard drinks or equivalent per week    Comment: Social    Drug use: No   Sexual activity: Not Currently    Birth control/protection: Post-menopausal    Comment: 1st intercourse 73 yo- More than 5 partners  Other Topics Concern  Not on file  Social History Narrative   Not on file   Social Drivers of Health   Financial Resource Strain: Not on file  Food Insecurity: Not on file  Transportation Needs: Not on file  Physical Activity: Not on file  Stress: Not on file  Social Connections: Unknown (07/23/2021)   Received [facility], [facility]   Social Network    Social Network: Not on file  Intimate Partner Violence: Unknown (06/21/2021)   Received from [facility], [facility]   HITS    Physically Hurt: Not on file    Insult or Talk Down To: Not on file    Threaten Physical Harm: Not on file    Scream or Curse: Not on file   Family History:  Family History  Problem Relation Age of Onset   Heart disease Mother    Depression  Mother    Hypertension Mother    Heart disease Father    Hypertension Father    Aneurysm Father    Uterine cancer Sister 40   Hypertension Sister    Ovarian cancer Sister 82   Melanoma Sister 51   Depression Sister    Melanoma Sister 73   Heart disease Paternal Grandfather    Stroke Maternal Grandmother    Cancer Maternal Grandfather 27       Pancreatic   Stroke Paternal Grandmother    Cancer Paternal Uncle        multiple myeloma, prostate cancer and melanoma (separate primaries)   Colon cancer Neg Hx     Review of Systems: Constitutional: Doesn't report fevers, chills or abnormal weight loss Eyes: Doesn't report blurriness of vision Ears, nose, mouth, throat, and face: Doesn't report sore throat Respiratory: Doesn't report cough, dyspnea or wheezes Cardiovascular: Doesn't report palpitation, chest discomfort  Gastrointestinal:  Doesn't report nausea, constipation, diarrhea GU: Doesn't report incontinence Skin: Doesn't report skin rashes Neurological: Per HPI Musculoskeletal: Doesn't report joint pain Behavioral/Psych: Doesn't report anxiety  Physical Exam: Wt Readings from Last 3 Encounters:  08/11/23 95 lb 12.8 oz (43.5 kg)  07/22/23 95 lb 6.4 oz (43.3 kg)  07/07/23 95 lb 3.2 oz (43.2 kg)   Temp Readings from Last 3 Encounters:  08/11/23 98.4 F (36.9 C) (Temporal)  07/22/23 98.4 F (36.9 C) (Oral)  07/07/23 98.1 F (36.7 C) (Tympanic)   BP Readings from Last 3 Encounters:  08/11/23 138/63  07/22/23 128/63  07/07/23 (!) 121/59   Pulse Readings from Last 3 Encounters:  08/11/23 79  07/22/23 88  07/07/23 79   KPS: 80. General: Alert, cooperative, pleasant, in no acute distress Head: Normal EENT: No conjunctival injection or scleral icterus.  Lungs: Resp effort normal Cardiac: Regular rate Abdomen: Non-distended abdomen Skin: No rashes cyanosis or petechiae. Extremities: No clubbing or edema  Neurologic Exam: Mental Status: Awake, alert, attentive  to examiner. Oriented to self and environment. Language is fluent with intact comprehension.  Cranial Nerves: Visual acuity is grossly normal. Visual fields are full. Extra-ocular movements intact. No ptosis. Face is symmetric Motor: Tone and bulk are normal. Power is full in both arms and legs. Reflexes are symmetric, no pathologic reflexes present.  Sensory: Intact to light touch Gait: Normal.   Labs: I have reviewed the data as listed    Component Value Date/Time   NA 138 06/29/2023 0732   K 3.9 06/29/2023 0732   CL 102 06/29/2023 0732   CO2 29 06/29/2023 0732   GLUCOSE 90 06/29/2023 0732   BUN 9 06/29/2023  0732   CREATININE 0.52 06/29/2023 0732   CREATININE 0.57 11/06/2022 0741   CALCIUM 9.5 06/29/2023 0732   PROT 7.3 06/29/2023 0732   ALBUMIN 4.5 06/29/2023 0732   AST 25 06/29/2023 0732   AST 33 11/06/2022 0741   ALT 17 06/29/2023 0732   ALT 25 11/06/2022 0741   ALKPHOS 63 06/29/2023 0732   BILITOT 0.7 06/29/2023 0732   BILITOT 0.5 11/06/2022 0741   GFRNONAA >60 06/29/2023 0732   GFRNONAA >60 11/06/2022 0741   GFRAA >60 12/05/2019 1145   Lab Results  Component Value Date   WBC 5.6 06/29/2023   NEUTROABS 3.4 06/29/2023   HGB 12.0 06/29/2023   HCT 35.1 (L) 06/29/2023   MCV 89.1 06/29/2023   PLT 224 06/29/2023    Imaging:  [facility] Clinician Interpretation: I have personally reviewed the CNS images as listed.  My interpretation, in the context of the patient's clinical presentation, is treatment effect vs true progression  MR BRAIN W WO CONTRAST Result Date: 08/04/2023 CLINICAL DATA:  Brain metastases.  Ovarian cancer. EXAM: MRI HEAD WITHOUT AND WITH CONTRAST TECHNIQUE: Multiplanar, multiecho pulse sequences of the brain and surrounding structures were obtained without and with intravenous contrast. CONTRAST:  5 mL Vueway  COMPARISON:  MR head without and with contrast 01/29/2023 and 07/31/2022. FINDINGS: Brain: Postoperative changes of the posterior right frontal lobe  are again noted. New focal enhancement is present along the posterior margin of the operative cavity measuring 7.5 x 6.5 x 9.0 mm. No significant change is present in the T2 hyperintensity surrounding the operative bed. The 3 mm focus of enhancement adjacent to the left hypoglossal canal is stable. No new areas of pathologic enhancement are present. Periventricular and scattered subcortical T2 hyperintensities are otherwise stable. Deep brain nuclei are within normal limits. The ventricles are of normal size. No significant extraaxial fluid collection is present. The brainstem and cerebellum are within normal limits. Midline structures are within normal limits. Vascular: Flow is present in the major intracranial arteries. Skull and upper cervical spine: The craniocervical junction is normal. Cervical spine fusion is again noted at C3-4. Marrow signal is unremarkable. Sinuses/Orbits: The paranasal sinuses and mastoid air cells are clear. The globes and orbits are within normal limits. IMPRESSION: 1. New focal enhancement along the posterior margin of the operative cavity measuring 7.5 x 6.5 x 9.0 mm is concerning for recurrent disease. 2. Stable 3 mm focus of enhancement adjacent to the left hypoglossal canal. 3. No new areas of pathologic enhancement. 4. Stable periventricular and scattered subcortical T2 hyperintensities bilaterally. This likely reflects the sequela of chronic microvascular ischemia. These results will be called to the ordering clinician or representative by the Radiologist Assistant, and communication documented in the PACS or [employer]. Electronically Signed   By: Audree Leas M.D.   On: 08/04/2023 15:48     Assessment/Plan Secondary malignant neoplasm of brain [facility])  LEANI MYRON is clinically stable today.  MRI brain demonstrates small focus of enhancement immediately adjacent to right frontal resection cavity.  Etiology is unclear; recurrent tumor vs delayed radiation  effect.  There is no mass effect or increase in T2 signal abnormality.  Recommended close imaging surveillance at this time, with repeat MRI brain in 6 weeks.  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  Recent uptick in auras will be monitored, unlikely related to small change on MRI.  Will con't to follow with Dr. Marton Sleeper, remains on observation.  We appreciate the opportunity to participate in the  care of Loyola Rummage.    We ask that JANIESHA DIEHL return to clinic in 6 weeks following next brain MRI, or sooner as needed.    All questions were answered. The patient knows to call the clinic with any problems, questions or concerns. No barriers to learning were detected.  The total time spent in the encounter was 40 minutes and more than 50% was on counseling and review of test results   Mamie Searles, MD Medical Director of Neuro-Oncology [facility] [place] 08/11/23 10:36 AM

## 2023-08-13 DIAGNOSIS — H43813 Vitreous degeneration, bilateral: Secondary | ICD-10-CM | POA: Diagnosis not present

## 2023-08-13 DIAGNOSIS — H2513 Age-related nuclear cataract, bilateral: Secondary | ICD-10-CM | POA: Diagnosis not present

## 2023-08-13 DIAGNOSIS — H35372 Puckering of macula, left eye: Secondary | ICD-10-CM | POA: Diagnosis not present

## 2023-08-13 DIAGNOSIS — H40033 Anatomical narrow angle, bilateral: Secondary | ICD-10-CM | POA: Diagnosis not present

## 2023-08-13 DIAGNOSIS — H401421 Capsular glaucoma with pseudoexfoliation of lens, left eye, mild stage: Secondary | ICD-10-CM | POA: Diagnosis not present

## 2023-08-17 ENCOUNTER — Inpatient Hospital Stay: Payer: Medicare Other | Attending: Gynecologic Oncology

## 2023-08-17 ENCOUNTER — Other Ambulatory Visit: Payer: Self-pay

## 2023-08-17 DIAGNOSIS — Z452 Encounter for adjustment and management of vascular access device: Secondary | ICD-10-CM | POA: Insufficient documentation

## 2023-08-17 DIAGNOSIS — C482 Malignant neoplasm of peritoneum, unspecified: Secondary | ICD-10-CM | POA: Insufficient documentation

## 2023-08-17 DIAGNOSIS — C7931 Secondary malignant neoplasm of brain: Secondary | ICD-10-CM | POA: Insufficient documentation

## 2023-08-21 DIAGNOSIS — Z1331 Encounter for screening for depression: Secondary | ICD-10-CM | POA: Diagnosis not present

## 2023-08-21 DIAGNOSIS — R051 Acute cough: Secondary | ICD-10-CM | POA: Diagnosis not present

## 2023-08-24 ENCOUNTER — Inpatient Hospital Stay

## 2023-08-24 VITALS — BP 125/48 | HR 62 | Temp 98.1°F | Resp 16

## 2023-08-24 DIAGNOSIS — C482 Malignant neoplasm of peritoneum, unspecified: Secondary | ICD-10-CM

## 2023-08-24 DIAGNOSIS — C7931 Secondary malignant neoplasm of brain: Secondary | ICD-10-CM | POA: Diagnosis not present

## 2023-08-24 DIAGNOSIS — Z452 Encounter for adjustment and management of vascular access device: Secondary | ICD-10-CM | POA: Diagnosis present

## 2023-08-24 MED ORDER — HEPARIN SOD (PORK) LOCK FLUSH 100 UNIT/ML IV SOLN
500.0000 [IU] | Freq: Once | INTRAVENOUS | Status: AC
  Administered 2023-08-24: 500 [IU]

## 2023-08-24 MED ORDER — SODIUM CHLORIDE 0.9% FLUSH
10.0000 mL | Freq: Once | INTRAVENOUS | Status: AC
  Administered 2023-08-24: 10 mL

## 2023-09-07 ENCOUNTER — Other Ambulatory Visit: Payer: Self-pay | Admitting: Internal Medicine

## 2023-09-16 ENCOUNTER — Other Ambulatory Visit

## 2023-09-17 ENCOUNTER — Ambulatory Visit
Admission: RE | Admit: 2023-09-17 | Discharge: 2023-09-17 | Disposition: A | Discharge status: Home / Self Care | Source: Ambulatory Visit | Attending: Internal Medicine | Admitting: Internal Medicine

## 2023-09-17 DIAGNOSIS — Z08 Encounter for follow-up examination after completed treatment for malignant neoplasm: Secondary | ICD-10-CM | POA: Diagnosis not present

## 2023-09-17 DIAGNOSIS — Z85841 Personal history of malignant neoplasm of brain: Secondary | ICD-10-CM | POA: Diagnosis not present

## 2023-09-17 DIAGNOSIS — C7931 Secondary malignant neoplasm of brain: Secondary | ICD-10-CM

## 2023-09-17 MED ORDER — GADOPICLENOL 0.5 MMOL/ML IV SOLN
5.0000 mL | Freq: Once | INTRAVENOUS | Status: AC | PRN
  Administered 2023-09-17: 5 mL via INTRAVENOUS

## 2023-09-17 MED ORDER — HEPARIN SOD (PORK) LOCK FLUSH 100 UNIT/ML IV SOLN: 500.0000 [IU] | Freq: Once | INTRAVENOUS | Status: DC

## 2023-09-17 MED ORDER — SODIUM CHLORIDE 0.9% FLUSH
10.0000 mL | INTRAVENOUS | Status: DC | PRN
Start: 2023-09-17 — End: 2023-09-18

## 2023-09-21 ENCOUNTER — Inpatient Hospital Stay

## 2023-09-22 ENCOUNTER — Inpatient Hospital Stay: Attending: Gynecologic Oncology | Admitting: Internal Medicine

## 2023-09-22 VITALS — BP 122/68 | HR 75 | Temp 97.3°F | Resp 18 | Wt 94.2 lb

## 2023-09-22 DIAGNOSIS — R569 Unspecified convulsions: Secondary | ICD-10-CM | POA: Insufficient documentation

## 2023-09-22 DIAGNOSIS — C482 Malignant neoplasm of peritoneum, unspecified: Secondary | ICD-10-CM | POA: Insufficient documentation

## 2023-09-22 DIAGNOSIS — C7931 Secondary malignant neoplasm of brain: Secondary | ICD-10-CM | POA: Insufficient documentation

## 2023-09-22 DIAGNOSIS — Z79899 Other long term (current) drug therapy: Secondary | ICD-10-CM | POA: Insufficient documentation

## 2023-09-22 NOTE — Progress Notes (Signed)
 [name] [address]  [name], [name] [phone]   Interval Evaluation  Date of Service: 09/22/23 Patient Name: [name] [name] Patient MRN: [id number] Patient DOB: 01-21-1951 Provider: Arthea MARLA Manns, MD  Identifying Statement:  [name] [name] is a 73 y.o. 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 Findings highly suspicious for peritoneal carcinomatosis. Recommend paracentesis for therapeutic and diagnostic purposes. I do not see an obvious primary lesion but there is some irregular enhancing soft tissue in the right adnexal area. CA 125 level may be helpful. 2. Mild surface irregularity involving the liver but I do not see any obvious changes of cirrhosis. No hepatic lesions.   11/23/2018 Tumor Marker   Patient's tumor was tested for the following markers: CA-125 Results of the tumor marker test revealed 253.   11/25/2018 Initial Diagnosis   Peritoneal carcinoma (HCC)   11/25/2018 Imaging   US  paracentesis Successful ultrasound-guided paracentesis yielding 3.4 L of peritoneal fluid.   12/01/2018 Cancer Staging   Staging form: Ovary, Fallopian Tube, and Primary Peritoneal Carcinoma, AJCC 8th Edition - Clinical stage from 12/01/2018: Stage IVA (rcT3, cN0, pM1a) - Signed by Lonn Hicks, MD on 02/14/2021 Stage prefix: Recurrence   12/02/2018 Tumor Marker   Patient's tumor was tested for the following markers: CA-125 Results of the tumor marker test revealed 267   12/06/2018 - 05/16/2019 Chemotherapy   The patient had carboplatin  and taxol  for chemotherapy treatment.     12/08/2018 Procedure   Successful ultrasound-guided  therapeutic paracentesis yielding 1.7 liters of peritoneal fluid.   12/24/2018 Procedure   Successful placement of a right IJ approach Power Port with ultrasound and fluoroscopic guidance. The catheter is ready for use   01/17/2019 Tumor Marker   Patient's tumor was tested for the following markers: CA-125 Results of the tumor marker test revealed 57.2   01/31/2019 Imaging   Significant decrease in peritoneal carcinomatosis since previous study. Interval resolution of ascites.   Focal area of parenchymal consolidation in central right middle lobe, which measures 3 cm. Differential diagnosis includes infectious or inflammatory process, atelectasis, and neoplasm. Recommend short-term follow-up by chest CT in 2-3 months.     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   Surgeon: Eloy Maurilio Fitch    Operative Findings:  : grossly normal uterus, ovaries normal, few scattered peritoneal nodules (1mm) on serosa of uterus and tubes. The omentum (gastrocolic) was tethered to the mesentery with tumor (thin rind). Complete (optimal) resection of tumor with no gross residual disease.     02/17/2019 Pathology Results   FINAL MICROSCOPIC DIAGNOSIS: A. UTERUS, CERVIX, BILATERAL FALLOPIAN TUBES AND OVARIES, HYSTERECTOMY WITH SALPINGOOOPHORECTOMY: - Uterus: Endometrium: Inactive endometrium. No hyperplasia or malignancy. Myometrium: Unremarkable. No malignancy. Serosa: Metastatic carcinoma. No malignancy. - Cervix: Benign squamous and endocervical mucosa. No dysplasia or malignancy. - Left ovary and fallopian tube: Metastatic carcinoma. - Right ovary: No malignancy identified. - Right fallopian tube: Luminal tumor, see comment. B. OMENTUM, RESECTION: - High  grade serous  carcinoma. - Deposits up to at least 3 cm. - See oncology table. ONCOLOGY TABLE: OVARY or FALLOPIAN TUBE or PRIMARY PERITONEUM: Procedure: Hysterectomy with bilateral salpingo-oophorectomy and omentectomy. Specimen Integrity: Intact Tumor Site: Peritoneum Ovarian Surface Involvement (required only if applicable): Left ovary Fallopian Tube Surface Involvement (required only if applicable): Left Fallopian tube Tumor Size: Largest deposit 3 cm Histologic Type: High-grade serous carcinoma Histologic Grade: High-grade Implants (required for advanced stage serous/seromucinous borderline tumors only): Uterine serosa, left fallopian tube and ovary, omentum Other Tissue/ Organ Involvement: As above Largest Extrapelvic Peritoneal Focus (required only if applicable): 3 cm Peritoneal/Ascitic Fluid: Pre neoadjuvant [id number] positive for carcinoma. Treatment Effect (required only for high-grade serous carcinomas): Probably partial treatment effect in omental tissue. Regional Lymph Nodes: No lymph nodes submitted or found Pathologic Stage Classification (pTNM, AJCC 8th Edition): ypT3c, ypNX Representative Tumor Block: B5 Comment(s): There is surface involvement of the left ovary and fallopian tube but no primary tumor. Within the right fallopian tube lumen there is detached fragments of tumor but again no precursor lesion is noted in the right fallopian tube. Thus, the tumor is presumed primary peritoneal and staged as such.   03/21/2019 Tumor Marker   Patient's tumor was tested for the following markers: CA-125 Results of the tumor marker test revealed 15.1    Genetic Testing   Negative genetic testing. No pathogenic variants identified on the [employer] CancerNext + RNAinsight panel. VUS in ATM called [id number] identified. The report date is 05/12/2019. TumorNext HRD was originally ordered but there was not enough sample to complete this testing.   The CancerNext+RNAinsight gene panel offered  by [employer] includes sequencing and rearrangement analysis for the following 36 genes: APC*, ATM*, AXIN2, BARD1, BMPR1A, BRCA1*, BRCA2*, BRIP1*, CDH1*, CDK4, CDKN2A, CHEK2*, DICER1, MLH1*, MSH2*, MSH3, MSH6*, MUTYH*, NBN, NF1*, NTHL1, PALB2*, PMS2*, PTEN*, RAD51C*, RAD51D*, RECQL, SMAD4, SMARCA4, STK11 and TP53* (sequencing and deletion/duplication); HOXB13, POLD1 and POLE (sequencing only); EPCAM and GREM1 (deletion/duplication only). DNA and RNA analyses performed for * genes.    05/16/2019 Tumor Marker   Patient's tumor was tested for the following markers: CA-125 Results of the tumor marker test revealed 10.7   06/16/2019 Imaging   1. No evidence of residual or recurrent metastatic disease in the chest, abdomen or pelvis. 2. Masslike focus of consolidation in the right middle lobe along the major and minor fissures with associated volume loss is stable since 01/31/2019. Indolent primary pulmonary neoplasm not excluded. PET-CT may be considered for further characterization. 3. Aortic Atherosclerosis (ICD10-I70.0).   06/16/2019 Tumor Marker   Patient's tumor was tested for the following markers: CA-125 Results of the tumor marker test revealed 9.1.   12/05/2019 Tumor Marker   Patient's tumor was tested for the following markers: CA-125. Results of the tumor marker test revealed 9.0.   02/29/2020 Tumor Marker   Patient's tumor was tested for the following markers: CA-`125 Results of the tumor marker test revealed 9.7.   05/28/2020 Tumor Marker   Patient's tumor was tested for the following markers: CA-125 Results of the tumor marker test revealed 9.5   08/16/2020 Tumor Marker   Patient's tumor was tested for the following markers: CA-125 Results of the tumor marker test revealed 10.9   11/29/2020 Tumor Marker   Patient's tumor was tested for the following markers: CA-125. Results of the tumor marker test revealed 10.2.   01/22/2021 Pathology Results   SURGICAL PATHOLOGY  CASE:  [phone]  PATIENT: CLARITA REEF  Surgical Pathology Report   Clinical History:  brain metastasis (cm)    FINAL MICROSCOPIC DIAGNOSIS:   A. 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    03/20/2021 Imaging   1. Unchanged enlarged midline superior mediastinal and left retroperitoneal lymph nodes, which remain suspicious for metastatic disease. 2. Unchanged masslike consolidation and volume loss of the right middle lobe measuring 3.8 x 1.9 cm, likely chronic scarring. Continued attention on follow-up. 3. No new evidence of metastatic disease in the chest, abdomen, or pelvis. 4. Status post hysterectomy and omentectomy.   Aortic Atherosclerosis (ICD10-I70.0).     03/21/2021 - 05/07/2021 Chemotherapy   Patient is on Treatment Plan : OVARIAN RECURRENT 3RD LINE Carboplatin  D1 / Gemcitabine  D1,8 (4/800) q21d     05/17/2021 Imaging   1. No new suspicious mass or lymphadenopathy identified in the chest, abdomen or pelvis. 2. Interval decreased size of previous enlarged superior mediastinal and left retroperitoneal lymph nodes. 3. Other ancillary findings as described.   05/21/2021 - 08/13/2021 Chemotherapy   Patient is on Treatment Plan : OVARIAN Gemcitabine  D1,8 q21d      08/16/2021 Imaging   MRI brain  1. Regression of disease. Resolved marginal enhancement at the right middle frontal gyrus resection cavity and regressed enhancement at the treated left parietal lesion.   2. No new metastatic disease or acute intracranial abnormality.   08/19/2021 Imaging   Slight decrease in size of small left retroperitoneal and superior mediastinal lymph nodes.   No new or progressive disease within the chest, abdomen, or pelvis.   Aortic Atherosclerosis (ICD10-I70.0).     11/29/2021 Imaging   1. Slightly decreased size of the small left retroperitoneal lymph node  with stable superior mediastinal lymph node. 2. No new or progressive disease within the chest, abdomen or pelvis. 3.  Aortic Atherosclerosis (ICD10-I70.0).     03/27/2022 Imaging   1. No current findings of active malignancy. 2. Stable chronic atelectasis of the right middle lobe. 3. Mild aortoiliac atherosclerotic vascular calcification. 4. Degenerative subcortical cystic lesion along the left acetabulum. 5. Old healed left posterior sixth and seventh rib fractures. 6. Aortic atherosclerosis.   07/22/2022 Imaging   CT CHEST ABDOMEN PELVIS W CONTRAST  Result Date: 07/21/2022 CLINICAL DATA:  Restaging ovarian cancer.  * Tracking Code: BO *. EXAM: CT CHEST, ABDOMEN, AND PELVIS WITH CONTRAST TECHNIQUE: Multidetector CT imaging of the chest, abdomen and pelvis was performed following the standard protocol during bolus administration of intravenous contrast. RADIATION DOSE REDUCTION: This exam was performed according to the departmental dose-optimization program which includes automated exposure control, adjustment of the mA and/or kV according to patient size and/or use of iterative reconstruction technique. CONTRAST:  80mL OMNIPAQUE  IOHEXOL  300 MG/ML  SOLN COMPARISON:  03/25/2022 FINDINGS: CT CHEST FINDINGS Cardiovascular: Mild cardiac enlargement. No pericardial effusion. Aortic atherosclerotic calcifications. Mediastinum/Nodes: Thyroid  gland, trachea, and esophagus are unremarkable. No enlarged mediastinal or hilar lymph nodes. No axillary adenopathy. Lungs/Pleura: No pleural fluid. Stable appearance of atelectasis involving the right middle lobe. No suspicious pulmonary nodules identified. Musculoskeletal: T3 vertebral hemangioma. No aggressive or acute osseous findings. Remote left posterior rib fractures. CT ABDOMEN PELVIS FINDINGS Hepatobiliary: No focal liver abnormality is seen. No gallstones, gallbladder wall thickening, or biliary dilatation. Pancreas: Unremarkable. No pancreatic ductal  dilatation or surrounding inflammatory changes. Spleen: Normal in size without focal abnormality. Adrenals/Urinary Tract: Normal adrenal glands. No kidney mass or hydronephrosis identified. Urinary bladder is unremarkable.  Stomach/Bowel: Stomach appears normal. No pathologic dilatation of the large or small bowel loops. No bowel wall thickening or inflammation. Vascular/Lymphatic: Aortic atherosclerosis. No aneurysm. New soft tissue nodule or lymph node along the gastrohepatic ligament measures 0.7 cm, image 61/2. Adjacent nodule is also new measuring 0.8 cm, image 60/2. The previously referenced right gastric lymph node measuring 8 mm is unchanged, image 66/3. No enlarged pelvic or inguinal lymph nodes. Reproductive: Status post hysterectomy. No adnexal masses. Other: Status post prior omentectomy. No ascites. No discrete fluid collections identified. Musculoskeletal: No acute or significant osseous findings. IMPRESSION: 1. New soft tissue nodules or lymph nodes along the gastrohepatic ligament are identified measuring up to 0.8 cm. Recurrent peritoneal disease or nodal disease cannot be excluded. 2. Stable appearance of previously referenced right gastric lymph node measuring 8 mm. 3. No signs of metastatic disease to the chest. 4.  Aortic Atherosclerosis (ICD10-I70.0). Electronically Signed   By: Waddell Calk M.D.   On: 07/21/2022 09:47      11/09/2022 Tumor Marker   Patient's tumor was tested for the following markers: CA-125. Results of the tumor marker test revealed 8.2.   11/11/2022 Imaging   CT CHEST ABDOMEN PELVIS W CONTRAST  Result Date: 11/10/2022 CLINICAL DATA:  Ovarian cancer restaging.  * Tracking Code: BO *. EXAM: CT CHEST, ABDOMEN, AND PELVIS WITH CONTRAST TECHNIQUE: Multidetector CT imaging of the chest, abdomen and pelvis was performed following the standard protocol during bolus administration of intravenous contrast. RADIATION DOSE REDUCTION: This exam was performed according to the  departmental dose-optimization program which includes automated exposure control, adjustment of the mA and/or kV according to patient size and/or use of iterative reconstruction technique. CONTRAST:  80mL OMNIPAQUE  IOHEXOL  300 MG/ML  SOLN COMPARISON:  None Available. FINDINGS: CT CHEST FINDINGS Cardiovascular: The heart size is normal. No substantial pericardial effusion. Right Port-A-Cath tip is positioned at the SVC/RA junction. Mediastinum/Nodes: No mediastinal lymphadenopathy. There is no hilar lymphadenopathy. The esophagus has normal imaging features. There is no axillary lymphadenopathy. Lungs/Pleura: Biapical pleuroparenchymal scarring evident. Right middle lobe collapse/consolidation is stable. No new suspicious pulmonary nodule or mass. Musculoskeletal: No worrisome lytic or sclerotic osseous abnormality. CT ABDOMEN PELVIS FINDINGS Hepatobiliary: No suspicious focal abnormality within the liver parenchyma. There is no evidence for gallstones, gallbladder wall thickening, or pericholecystic fluid. No intrahepatic or extrahepatic biliary dilation. Pancreas: No focal mass lesion. No dilatation of the main duct. No intraparenchymal cyst. No peripancreatic edema. Spleen: No splenomegaly. No suspicious focal mass lesion. Adrenals/Urinary Tract: No adrenal nodule or mass. Kidneys unremarkable. No evidence for hydroureter. The urinary bladder appears normal for the degree of distention. Stomach/Bowel: Stomach is unremarkable. No gastric wall thickening. No evidence of outlet obstruction. Duodenum is normally positioned as is the ligament of Treitz. No small bowel wall thickening. No small bowel dilatation. The terminal ileum is normal. The appendix is normal. No gross colonic mass. No colonic wall thickening. Vascular/Lymphatic: There is mild atherosclerotic calcification of the abdominal aorta without aneurysm. 8 mm short axis lymph nodes in the gastrohepatic ligament (images 59 in 60 of series 2 are stable in  the interval. Small left para-aortic lymph nodes are stable. No retroperitoneal or mesenteric lymphadenopathy. No pelvic sidewall lymphadenopathy. Reproductive: Hysterectomy.  There is no adnexal mass. Other: No intraperitoneal free fluid. Musculoskeletal: No worrisome lytic or sclerotic osseous abnormality. IMPRESSION: 1. Stable exam. No new or progressive findings to suggest recurrent or metastatic disease in the chest, abdomen, or pelvis. 2. Small lymph nodes in the gastrohepatic ligament identified  previously are stable in the interval. Continued attention on follow-up recommended. 3. Stable right middle lobe collapse/consolidation. 4.  Aortic Atherosclerosis (ICD10-I70.0). Electronically Signed   By: Camellia Candle M.D.   On: 11/10/2022 14:58      03/24/2023 Imaging   CT CHEST ABDOMEN PELVIS W CONTRAST Result Date: 03/27/2023 CLINICAL DATA:  Staging ovarian cancer.  * Tracking Code: BO *. EXAM: CT CHEST, ABDOMEN, AND PELVIS WITH CONTRAST TECHNIQUE: Multidetector CT imaging of the chest, abdomen and pelvis was performed following the standard protocol during bolus administration of intravenous contrast. RADIATION DOSE REDUCTION: This exam was performed according to the departmental dose-optimization program which includes automated exposure control, adjustment of the mA and/or kV according to patient size and/or use of iterative reconstruction technique. CONTRAST:  OMNIPAQUE  IOHEXOL  300 MG/ML  SOLN COMPARISON:  11/06/2022 and older. FINDINGS: CT CHEST FINDINGS Cardiovascular: Stable mild cardiac enlargement. Trace pericardial fluid. The thoracic aorta has a normal course and caliber. Slight intimal thickening and plaque. Right upper chest port is accessed with the tip extending to the upper right atrium. Prominent pulmonary arteries. Mediastinum/Nodes: Preserved thyroid  gland. Slightly patulous thoracic esophagus. No specific abnormal lymph node enlargement seen in the axillary region, hilum mediastinum.  Small right anterior cardiophrenic angle node on series 2, image 50 is similar to previous measuring 5 mm in short axis. Lungs/Pleura: There is some linear opacity seen along bases likely scar or atelectasis. No pneumothorax or effusion. Stable juxtapleural left lower lobe 3 mm lung nodule on series 6, image 115. There is bandlike atelectasis seen along the posterosuperior aspect of the middle lobe. Also unchanged from previous. Minimal dependent atelectasis as well elsewhere. Musculoskeletal: No chest wall mass or suspicious bone lesions identified. CT ABDOMEN PELVIS FINDINGS Hepatobiliary: No focal liver abnormality is seen. Fatty liver infiltration diffusely. No gallstones, gallbladder wall thickening, or biliary dilatation. Pancreas: Unremarkable. No pancreatic ductal dilatation or surrounding inflammatory changes. Spleen: Normal in size without focal abnormality. Adrenals/Urinary Tract: Adrenal glands are unremarkable. Kidneys are normal, without renal calculi, focal lesion, or hydronephrosis. Bladder is unremarkable. Stomach/Bowel: Stomach is within normal limits. Appendix appears normal. No evidence of bowel wall thickening, distention, or inflammatory changes. Vascular/Lymphatic: Aortic atherosclerosis. Circumaortic renal vein. Abnormal nodes again seen in the upper abdomen.One is slightly larger now measuring 12 mm on image 62 of series 2. Previously 8 mm. Second small node just medial and superior to the first is similar at 8 mm on image 60. No new lymph node enlargement elsewhere. Few nonpathologic retroperitoneal nodes more caudal near the aortic bifurcation. Reproductive: Status post hysterectomy. No adnexal masses. Other: No abdominal wall hernia or abnormality. No abdominopelvic ascites. Musculoskeletal: No acute or significant osseous findings. IMPRESSION: Upper abdominal abnormal nodes are again seen. One is slightly larger today than previous. No new nodal enlargement. No developing ascites. Fatty  liver infiltration. Stable segmental collapse of the middle lobe. Electronically Signed   By: Ranell Bring M.D.   On: 03/27/2023 13:53      03/26/2023 Tumor Marker   Patient's tumor was tested for the following markers: CA-125. Results of the tumor marker test revealed 8.9.   06/29/2023 Imaging   *No acute findings in the abdomen or pelvis. *Moderate amount of residual fecal material throughout the colon without obstruction or constipation. *Status post hysterectomy. No adnexal masses.      CNS Oncologic History 01/22/21: R frontal craniotomy, resection with Dr. Carollee 02/21/21: Post-op SRS and SRS to L parietal target Cinda)  Interval History: Clarita GORMAN Reef  presents for follow up. No further seizures or auras since prior visit.  Does have ongoing short term memory issues, fatigue.  Continues on Keppra  1000mg  twice per day.  Continues on observation with Dr. Lonn.  H+P (12/25/20) Patient presented to the ED on 12/22/20 with twitching of her left side of her face for 1 to 2 minutes, accompanied by slurring of speech.  There was no tongue biting, bowel or bladder incontinence.  Workup demonstrated two brain lesions consistent with suspected metastases.  She was started on Keppra  (no decadron ) and discharged two days later with no further seizure like events.  Priro to admission she had also been complaining of dizziness, confusion, impaired memory.  Has been seeing Dr. Lonn since 2021 for primary peritoneal carcinoma.   Medications: Current Outpatient Medications on File Prior to Visit  Medication Sig Dispense Refill   Azelaic Acid  15 % cream Apply 1 application topically daily.     Calcium Carb-Cholecalciferol (CALCIUM 600 + D PO) Take 600 mg by mouth daily. Calcium 600 mg with Vitamin D3 800 units     cholecalciferol (VITAMIN D3) 25 MCG (1000 UNIT) tablet Take 5,000 Units by mouth daily.     Cyanocobalamin (VITAMIN B 12) 500 MCG TABS Take 500 mcg by mouth daily.     gabapentin   (NEURONTIN ) 300 MG capsule Take 300 mg by mouth 3 (three) times daily.     levETIRAcetam  (KEPPRA ) 500 MG tablet TAKE 2 TABLETS(1000 MG) BY MOUTH TWICE DAILY 120 tablet 3   lidocaine -prilocaine  (EMLA ) cream Apply to affected area(s) as needed. 30 g 3   LORazepam  (ATIVAN ) 2 MG tablet Take 1 tablet (2 mg total) by mouth every 6 (six) hours as needed for seizure. 10 tablet 0   No current facility-administered medications on file prior to visit.    Allergies:  Allergies  Allergen Reactions   Carboplatin  Itching and Rash    Patient has a hypersensitivity reaction to carboplatin .  See note from 04/30/2021 at 1439.  Reaction symptoms included itching of mouth and ears, burning in chest, hypotension, and rash to neck and arms.  Patient unable to complete infusion.   Fentanyl  Palpitations    Palpitations , fever ,headahe, N/V   loud pounding heart beat    Vancomycin  Rash   Milk (Cow) Diarrhea and Nausea And Vomiting   Brimonidine Tartrate     Other reaction(s): blurred vision   Brimonidine Tartrate     Other reaction(s): blurred vision   Ditropan [Oxybutynin]    Milk-Related Compounds Diarrhea and Nausea And Vomiting   Other Other (See Comments)   Oxybutynin Chloride Other (See Comments)    Ditropan  Pt doesn't remember    Thimerosal (Thiomersal) Other (See Comments)    Pt doesn't remember   Tramadol Other (See Comments)    Pt doesn't remember   Avelox [Moxifloxacin Hcl In Nacl] Rash   Doxycycline Rash   Erythromycin Rash   Metronidazole Rash   Minocycline Rash   Penicillins Rash    Did it involve swelling of the face/tongue/throat, SOB, or low BP? No Did it involve sudden or severe rash/hives, skin peeling, or any reaction on the inside of your mouth or nose? Yes, rash. Did you need to seek medical attention at a hospital or doctor's office? No When did it last happen?      Childhood allergy If all above answers are NO, may proceed with cephalosporin use.    Quinolones Rash    Sulfonamide Derivatives Rash   Past Medical History:  Past Medical History:  Diagnosis Date   Allergic rhinitis    Aortic atherosclerosis (HCC)    Aortic insufficiency    mild by echo 04/2023   Cervical dysplasia 1980   Cervicalgia    Eczema    Rosacea,dermatitis-Dr Stinehelfer   Esophageal reflux    Family history of colon cancer    Family history of melanoma    Family history of ovarian cancer    Family history of pancreatic cancer    Family history of prostate cancer    Family history of uterine cancer    Fibromyalgia    GERD (gastroesophageal reflux disease)    Glaucoma, narrow-angle    Heart murmur    Hiatal hernia    Irritable bowel syndrome    LBBB (left bundle branch block)    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), unspecified site    DCIS   Mass of lung    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   Mitral valve disorders(424.0)    No MVP and trivial MR by echo 10/2021   Osteoporosis 12/2016   T score -2.3, 2014 T score -2.5 AP spine   PAC (premature atrial contraction) 2012   ,PVC's, and nonsustained atril tachycardia w aberration by heart monitor    Pulmonic valve insufficiency    mild by echo 8/23   PVC (premature ventricular contraction)    Rosacea    Stricture and stenosis of esophagus    Tricuspid insufficiency    mild by echo 10/2021   Past Surgical History:  Past Surgical History:  Procedure Laterality Date   APPLICATION OF CRANIAL NAVIGATION N/A 01/22/2021   Procedure: APPLICATION OF CRANIAL NAVIGATION;  Surgeon: Carollee Lani BROCKS, DO;  Location: [facility] OR;  Service: Neurosurgery;  Laterality: N/A;   BREAST LUMPECTOMY  1998   right breast with radiation   CERVICAL DISCECTOMY     C3-7 with bone graft, in three different surgeries, have titanium plate and screws   CRANIOTOMY N/A 01/22/2021   Procedure: STEREOTACTIC RIGHT FRONTAL CRANIOTOMY FOR RESECTION OF TUMOR;  Surgeon: Carollee Lani BROCKS, DO;  Location: [facility] OR;  Service: Neurosurgery;  Laterality: N/A;    GYNECOLOGIC CRYOSURGERY  1980   IR IMAGING GUIDED PORT INSERTION  12/24/2018   NECK SURGERY  1993-1997   bone graft and fusion   OMENTECTOMY  02/17/2019   REFRACTIVE SURGERY     narrow angle glucoma   RHINOPLASTY  1976   sterotactic large core needle biopsy right breast Right 10/15/1996   TUBAL LIGATION  1977   Social History:  Social History   Socioeconomic History   Marital status: Married    Spouse name: Dale   Number of children: 0   Years of education: Not on file   Highest education level: Not on file  Occupational History   Occupation: Educational psychologist: [employer]  Tobacco Use   Smoking status: Never   Smokeless tobacco: Never  Vaping Use   Vaping status: Never Used  Substance and Sexual Activity   Alcohol use: Yes    Alcohol/week: 2.0 standard drinks of alcohol    Types: 2 Standard drinks or equivalent per week    Comment: Social    Drug use: No   Sexual activity: Not Currently    Birth control/protection: Post-menopausal    Comment: 1st intercourse 73 yo- More than 5 partners  Other Topics Concern   Not on file  Social History Narrative   Not on file   Social Drivers  of Health   Financial Resource Strain: Not on file  Food Insecurity: Not on file  Transportation Needs: Not on file  Physical Activity: Not on file  Stress: Not on file  Social Connections: Unknown (07/23/2021)   [facility]   Social Network    Social Network: Not on file  Intimate Partner Violence: Unknown (06/21/2021)   Received from [facility]   HITS    Physically Hurt: Not on file    Insult or Talk Down To: Not on file    Threaten Physical Harm: Not on file    Scream or Curse: Not on file   Family History:  Family History  Problem Relation Age of Onset   Heart disease Mother    Depression Mother    Hypertension Mother    Heart disease Father    Hypertension Father    Aneurysm Father    Uterine cancer Sister 54   Hypertension Sister     Ovarian cancer Sister 43   Melanoma Sister 91   Depression Sister    Melanoma Sister 22   Heart disease Paternal Grandfather    Stroke Maternal Grandmother    Cancer Maternal Grandfather 44       Pancreatic   Stroke Paternal Grandmother    Cancer Paternal Uncle        multiple myeloma, prostate cancer and melanoma (separate primaries)   Colon cancer Neg Hx     Review of Systems: Constitutional: Doesn't report fevers, chills or abnormal weight loss Eyes: Doesn't report blurriness of vision Ears, nose, mouth, throat, and face: Doesn't report sore throat Respiratory: Doesn't report cough, dyspnea or wheezes Cardiovascular: Doesn't report palpitation, chest discomfort  Gastrointestinal:  Doesn't report nausea, constipation, diarrhea GU: Doesn't report incontinence Skin: Doesn't report skin rashes Neurological: Per HPI Musculoskeletal: Doesn't report joint pain Behavioral/Psych: Doesn't report anxiety  Physical Exam: Wt Readings from Last 3 Encounters:  09/22/23 94 lb 3.2 oz (42.7 kg)  08/11/23 95 lb 12.8 oz (43.5 kg)  07/22/23 95 lb 6.4 oz (43.3 kg)   Temp Readings from Last 3 Encounters:  09/22/23 (!) 97.3 F (36.3 C)  08/24/23 98.1 F (36.7 C) (Oral)  08/11/23 98.4 F (36.9 C) (Temporal)   BP Readings from Last 3 Encounters:  09/22/23 122/68  08/24/23 (!) 125/48  08/11/23 138/63   Pulse Readings from Last 3 Encounters:  09/22/23 75  08/24/23 62  08/11/23 79   KPS: 80. General: Alert, cooperative, pleasant, in no acute distress Head: Normal EENT: No conjunctival injection or scleral icterus.  Lungs: Resp effort normal Cardiac: Regular rate Abdomen: Non-distended abdomen Skin: No rashes cyanosis or petechiae. Extremities: No clubbing or edema  Neurologic Exam: Mental Status: Awake, alert, attentive to examiner. Oriented to self and environment. Language is fluent with intact comprehension.  Cranial Nerves: Visual acuity is grossly normal. Visual fields are  full. Extra-ocular movements intact. No ptosis. Face is symmetric Motor: Tone and bulk are normal. Power is full in both arms and legs. Reflexes are symmetric, no pathologic reflexes present.  Sensory: Intact to light touch Gait: Normal.   Labs: I have reviewed the data as listed    Component Value Date/Time   NA 138 06/29/2023 0732   K 3.9 06/29/2023 0732   CL 102 06/29/2023 0732   CO2 29 06/29/2023 0732   GLUCOSE 90 06/29/2023 0732   BUN 9 06/29/2023 0732   CREATININE 0.52 06/29/2023 0732   CREATININE 0.57 11/06/2022 0741   CALCIUM 9.5 06/29/2023 0732  PROT 7.3 06/29/2023 0732   ALBUMIN 4.5 06/29/2023 0732   AST 25 06/29/2023 0732   AST 33 11/06/2022 0741   ALT 17 06/29/2023 0732   ALT 25 11/06/2022 0741   ALKPHOS 63 06/29/2023 0732   BILITOT 0.7 06/29/2023 0732   BILITOT 0.5 11/06/2022 0741   GFRNONAA >60 06/29/2023 0732   GFRNONAA >60 11/06/2022 0741   GFRAA >60 12/05/2019 1145   Lab Results  Component Value Date   WBC 5.6 06/29/2023   NEUTROABS 3.4 06/29/2023   HGB 12.0 06/29/2023   HCT 35.1 (L) 06/29/2023   MCV 89.1 06/29/2023   PLT 224 06/29/2023    Imaging:  [facility] Clinician Interpretation: I have personally reviewed the CNS images as listed.  My interpretation, in the context of the patient's clinical presentation, is treatment effect vs true progression  MR BRAIN W WO CONTRAST Result Date: 09/17/2023 CLINICAL DATA:  Brain/CNS neoplasm, assess treatment response. History of metastatic ovarian cancer. EXAM: MRI HEAD WITHOUT AND WITH CONTRAST TECHNIQUE: Multiplanar, multiecho pulse sequences of the brain and surrounding structures were obtained without and with intravenous contrast. CONTRAST:  5 mL Vueway  COMPARISON:  Head MRI 08/04/2023 FINDINGS: Brain: New lesions: None. Larger lesions: 1. Partly nodular and partly curvilinear enhancement at the posterior aspect of the right frontal resection site has mildly increased in thickness, now measuring 10 x 6 mm on  sagittal images (series 16, image 10 and series 13, image 127, previously 9 x 4 mm when remeasured in a similar fashion). Mild surrounding T2 hyperintensity is unchanged without mass effect. Stable or smaller lesions: 1. Unchanged 2 mm focus of cortical/subcortical enhancement in the left parietal lobe at the site of a previously treated metastasis (series 13, image 107). Other brain findings: A 3 mm focus of extra-axial enhancement just above the foramen magnum near the left hypoglossal canal and posterior to the vertebral artery is unchanged and may reflect a benign enhancing foramen magnum lesion or tiny meningioma (series 13, image 24). No acute infarct, midline shift, extra-axial fluid collection, or hydrocephalus is evident. Scattered small T2 hyperintensities in the cerebral white matter bilaterally are nonspecific but compatible with mild chronic small vessel ischemic disease. There is mild cerebral and cerebellar atrophy. Vascular: Major intracranial vascular flow voids are preserved. Skull and upper cervical spine: Right frontoparietal craniotomy. No suspicious marrow lesion. Anterior fusion in the cervical spine. Sinuses/Orbits: Unremarkable orbits. Paranasal sinuses and mastoid air cells are clear. Other: None. IMPRESSION: 1. Mildly increased thickness of enhancement at the right frontal resection site, indeterminate for post treatment changes or recurrent tumor. 2. No evidence of new intracranial metastases. Electronically Signed   By: Dasie Hamburg M.D.   On: 09/17/2023 15:43     Assessment/Plan Secondary malignant neoplasm of brain [facility])  Seizure (HCC)  VERLEEN STUCKEY is clinically stable today.  MRI brain again demonstrates small focus of enhancement immediately adjacent to right frontal resection cavity, slightly increased.  Etiology remains unclear; recurrent tumor vs delayed radiation effect.  There is no mass effect or increase in T2 signal abnormality.  Based on discussion in CNS  tumor board, recommended close imaging surveillance at this time, with repeat MRI brain in 2 months.  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    Will con't to follow with Dr. Lonn, remains on observation.  We appreciate the opportunity to participate in the care of GAIA GULLIKSON.    We ask that ANYA MURPHEY return to clinic in 2 months following next  brain MRI, or sooner as needed.    All questions were answered. The patient knows to call the clinic with any problems, questions or concerns. No barriers to learning were detected.  The total time spent in the encounter was 40 minutes and more than 50% was on counseling and review of test results   Arthea MARLA Manns, MD Medical Director of Neuro-Oncology [facility] at [facility] 09/22/23 10:02 AM

## 2023-09-29 DIAGNOSIS — E559 Vitamin D deficiency, unspecified: Secondary | ICD-10-CM | POA: Diagnosis not present

## 2023-10-01 DIAGNOSIS — I890 Lymphedema, not elsewhere classified: Secondary | ICD-10-CM | POA: Diagnosis not present

## 2023-10-01 DIAGNOSIS — H9312 Tinnitus, left ear: Secondary | ICD-10-CM | POA: Diagnosis not present

## 2023-10-01 DIAGNOSIS — H6992 Unspecified Eustachian tube disorder, left ear: Secondary | ICD-10-CM | POA: Diagnosis not present

## 2023-10-19 ENCOUNTER — Inpatient Hospital Stay: Attending: Gynecologic Oncology

## 2023-10-19 DIAGNOSIS — Z79899 Other long term (current) drug therapy: Secondary | ICD-10-CM | POA: Diagnosis not present

## 2023-10-19 DIAGNOSIS — C7931 Secondary malignant neoplasm of brain: Secondary | ICD-10-CM | POA: Insufficient documentation

## 2023-10-19 DIAGNOSIS — R569 Unspecified convulsions: Secondary | ICD-10-CM | POA: Diagnosis not present

## 2023-10-19 DIAGNOSIS — C482 Malignant neoplasm of peritoneum, unspecified: Secondary | ICD-10-CM | POA: Diagnosis not present

## 2023-10-19 DIAGNOSIS — Z452 Encounter for adjustment and management of vascular access device: Secondary | ICD-10-CM | POA: Diagnosis not present

## 2023-10-19 MED ORDER — SODIUM CHLORIDE 0.9% FLUSH
10.0000 mL | Freq: Once | INTRAVENOUS | Status: AC
  Administered 2023-10-19: 10 mL

## 2023-10-30 ENCOUNTER — Other Ambulatory Visit: Payer: Self-pay

## 2023-11-19 ENCOUNTER — Ambulatory Visit
Admission: RE | Admit: 2023-11-19 | Discharge: 2023-11-19 | Disposition: A | Discharge status: Home / Self Care | Source: Ambulatory Visit | Attending: Internal Medicine | Admitting: Internal Medicine

## 2023-11-19 DIAGNOSIS — C7931 Secondary malignant neoplasm of brain: Secondary | ICD-10-CM

## 2023-11-19 MED ORDER — GADOPICLENOL 0.5 MMOL/ML IV SOLN
5.0000 mL | Freq: Once | INTRAVENOUS | Status: AC | PRN
  Administered 2023-11-19: 5 mL via INTRAVENOUS

## 2023-11-23 ENCOUNTER — Inpatient Hospital Stay

## 2023-11-24 ENCOUNTER — Inpatient Hospital Stay: Attending: Gynecologic Oncology | Admitting: Internal Medicine

## 2023-11-24 VITALS — BP 129/64 | HR 71 | Temp 97.7°F | Resp 20 | Wt 94.4 lb

## 2023-11-24 DIAGNOSIS — Z808 Family history of malignant neoplasm of other organs or systems: Secondary | ICD-10-CM | POA: Insufficient documentation

## 2023-11-24 DIAGNOSIS — J9811 Atelectasis: Secondary | ICD-10-CM | POA: Insufficient documentation

## 2023-11-24 DIAGNOSIS — Z885 Allergy status to narcotic agent status: Secondary | ICD-10-CM | POA: Insufficient documentation

## 2023-11-24 DIAGNOSIS — Z880 Allergy status to penicillin: Secondary | ICD-10-CM | POA: Insufficient documentation

## 2023-11-24 DIAGNOSIS — H4020X0 Unspecified primary angle-closure glaucoma, stage unspecified: Secondary | ICD-10-CM | POA: Insufficient documentation

## 2023-11-24 DIAGNOSIS — I700 Atherosclerosis of aorta: Secondary | ICD-10-CM | POA: Insufficient documentation

## 2023-11-24 DIAGNOSIS — M797 Fibromyalgia: Secondary | ICD-10-CM | POA: Diagnosis not present

## 2023-11-24 DIAGNOSIS — Z86000 Personal history of in-situ neoplasm of breast: Secondary | ICD-10-CM | POA: Insufficient documentation

## 2023-11-24 DIAGNOSIS — Z452 Encounter for adjustment and management of vascular access device: Secondary | ICD-10-CM | POA: Diagnosis present

## 2023-11-24 DIAGNOSIS — Z91011 Allergy to milk products: Secondary | ICD-10-CM | POA: Diagnosis not present

## 2023-11-24 DIAGNOSIS — C7931 Secondary malignant neoplasm of brain: Secondary | ICD-10-CM | POA: Diagnosis not present

## 2023-11-24 DIAGNOSIS — Z823 Family history of stroke: Secondary | ICD-10-CM | POA: Insufficient documentation

## 2023-11-24 DIAGNOSIS — D1809 Hemangioma of other sites: Secondary | ICD-10-CM | POA: Diagnosis not present

## 2023-11-24 DIAGNOSIS — Z90710 Acquired absence of both cervix and uterus: Secondary | ICD-10-CM | POA: Insufficient documentation

## 2023-11-24 DIAGNOSIS — Z882 Allergy status to sulfonamides status: Secondary | ICD-10-CM | POA: Diagnosis not present

## 2023-11-24 DIAGNOSIS — Z881 Allergy status to other antibiotic agents status: Secondary | ICD-10-CM | POA: Insufficient documentation

## 2023-11-24 DIAGNOSIS — I083 Combined rheumatic disorders of mitral, aortic and tricuspid valves: Secondary | ICD-10-CM | POA: Insufficient documentation

## 2023-11-24 DIAGNOSIS — G319 Degenerative disease of nervous system, unspecified: Secondary | ICD-10-CM | POA: Insufficient documentation

## 2023-11-24 DIAGNOSIS — Z8249 Family history of ischemic heart disease and other diseases of the circulatory system: Secondary | ICD-10-CM | POA: Insufficient documentation

## 2023-11-24 DIAGNOSIS — Z8049 Family history of malignant neoplasm of other genital organs: Secondary | ICD-10-CM | POA: Diagnosis not present

## 2023-11-24 DIAGNOSIS — R180 Malignant ascites: Secondary | ICD-10-CM | POA: Diagnosis not present

## 2023-11-24 DIAGNOSIS — Z8041 Family history of malignant neoplasm of ovary: Secondary | ICD-10-CM | POA: Insufficient documentation

## 2023-11-24 DIAGNOSIS — C482 Malignant neoplasm of peritoneum, unspecified: Secondary | ICD-10-CM | POA: Diagnosis present

## 2023-11-24 DIAGNOSIS — M810 Age-related osteoporosis without current pathological fracture: Secondary | ICD-10-CM | POA: Diagnosis not present

## 2023-11-24 DIAGNOSIS — Z87410 Personal history of cervical dysplasia: Secondary | ICD-10-CM | POA: Insufficient documentation

## 2023-11-24 DIAGNOSIS — Z818 Family history of other mental and behavioral disorders: Secondary | ICD-10-CM | POA: Insufficient documentation

## 2023-11-24 DIAGNOSIS — Z800 Family history of malignant neoplasm of digestive organs: Secondary | ICD-10-CM | POA: Insufficient documentation

## 2023-11-24 DIAGNOSIS — Z807 Family history of other malignant neoplasms of lymphoid, hematopoietic and related tissues: Secondary | ICD-10-CM | POA: Insufficient documentation

## 2023-11-24 DIAGNOSIS — Z79899 Other long term (current) drug therapy: Secondary | ICD-10-CM | POA: Insufficient documentation

## 2023-11-24 NOTE — Progress Notes (Signed)
 [facility] [address]  [place], [name] [phone]   Interval Evaluation  Date of Service: 11/24/23 Patient Name: [name] [name] Patient MRN: [id number] Patient DOB: [birth year] Provider: Arthea MARLA Manns, MD  Identifying Statement:  [name] [name] is a 73 y.o. 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 Findings highly suspicious for peritoneal carcinomatosis. Recommend paracentesis for therapeutic and diagnostic purposes. I do not see an obvious primary lesion but there is some irregular enhancing soft tissue in the right adnexal area. CA 125 level may be helpful. 2. Mild surface irregularity involving the liver but I do not see any obvious changes of cirrhosis. No hepatic lesions.   11/23/2018 Tumor Marker   Patient's tumor was tested for the following markers: CA-125 Results of the tumor marker test revealed 253.   11/25/2018 Initial Diagnosis   Peritoneal carcinoma (HCC)   11/25/2018 Imaging   US  paracentesis Successful ultrasound-guided paracentesis yielding 3.4 L of peritoneal fluid.   12/01/2018 Cancer Staging   Staging form: Ovary, Fallopian Tube, and Primary Peritoneal Carcinoma, AJCC 8th Edition - Clinical stage from 12/01/2018: Stage IVA (rcT3, cN0, pM1a) - Signed by Lonn Hicks, MD on 02/14/2021 Stage prefix: Recurrence   12/02/2018 Tumor Marker   Patient's tumor was tested for the following markers: CA-125 Results of the tumor marker test revealed 267   12/06/2018 - 05/16/2019 Chemotherapy   The patient had carboplatin  and taxol  for chemotherapy treatment.     12/08/2018 Procedure   Successful ultrasound-guided therapeutic paracentesis  yielding 1.7 liters of peritoneal fluid.   12/24/2018 Procedure   Successful placement of a right IJ approach Power Port with ultrasound and fluoroscopic guidance. The catheter is ready for use   01/17/2019 Tumor Marker   Patient's tumor was tested for the following markers: CA-125 Results of the tumor marker test revealed 57.2   01/31/2019 Imaging   Significant decrease in peritoneal carcinomatosis since previous study. Interval resolution of ascites.   Focal area of parenchymal consolidation in central right middle lobe, which measures 3 cm. Differential diagnosis includes infectious or inflammatory process, atelectasis, and neoplasm. Recommend short-term follow-up by chest CT in 2-3 months.     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   Surgeon: Eloy Maurilio Fitch    Operative Findings:  : grossly normal uterus, ovaries normal, few scattered peritoneal nodules (1mm) on serosa of uterus and tubes. The omentum (gastrocolic) was tethered to the mesentery with tumor (thin rind). Complete (optimal) resection of tumor with no gross residual disease.     02/17/2019 Pathology Results   FINAL MICROSCOPIC DIAGNOSIS: A. UTERUS, CERVIX, BILATERAL FALLOPIAN TUBES AND OVARIES, HYSTERECTOMY WITH SALPINGOOOPHORECTOMY: - Uterus: Endometrium: Inactive endometrium. No hyperplasia or malignancy. Myometrium: Unremarkable. No malignancy. Serosa: Metastatic carcinoma. No malignancy. - Cervix: Benign squamous and endocervical mucosa. No dysplasia or malignancy. - Left ovary and fallopian tube: Metastatic carcinoma. - Right ovary: No malignancy identified. - Right fallopian tube: Luminal tumor, see comment. B. OMENTUM, RESECTION: - High grade serous  carcinoma. - Deposits  up to at least 3 cm. - See oncology table. ONCOLOGY TABLE: OVARY or FALLOPIAN TUBE or PRIMARY PERITONEUM: Procedure: Hysterectomy with bilateral salpingo-oophorectomy and omentectomy. Specimen Integrity: Intact Tumor Site: Peritoneum Ovarian Surface Involvement (required only if applicable): Left ovary Fallopian Tube Surface Involvement (required only if applicable): Left Fallopian tube Tumor Size: Largest deposit 3 cm Histologic Type: High-grade serous carcinoma Histologic Grade: High-grade Implants (required for advanced stage serous/seromucinous borderline tumors only): Uterine serosa, left fallopian tube and ovary, omentum Other Tissue/ Organ Involvement: As above Largest Extrapelvic Peritoneal Focus (required only if applicable): 3 cm Peritoneal/Ascitic Fluid: Pre neoadjuvant [id number] positive for carcinoma. Treatment Effect (required only for high-grade serous carcinomas): Probably partial treatment effect in omental tissue. Regional Lymph Nodes: No lymph nodes submitted or found Pathologic Stage Classification (pTNM, AJCC 8th Edition): ypT3c, ypNX Representative Tumor Block: B5 Comment(s): There is surface involvement of the left ovary and fallopian tube but no primary tumor. Within the right fallopian tube lumen there is detached fragments of tumor but again no precursor lesion is noted in the right fallopian tube. Thus, the tumor is presumed primary peritoneal and staged as such.   03/21/2019 Tumor Marker   Patient's tumor was tested for the following markers: CA-125 Results of the tumor marker test revealed 15.1    Genetic Testing   Negative genetic testing. No pathogenic variants identified on the [employer] CancerNext + RNAinsight panel. VUS in ATM called [id number] identified. The report date is 05/12/2019. TumorNext HRD was originally ordered but there was not enough sample to complete this testing.   The CancerNext+RNAinsight gene panel offered by [employer] includes sequencing and rearrangement analysis for the following 36 genes: APC*, ATM*, AXIN2, BARD1, BMPR1A, BRCA1*, BRCA2*, BRIP1*, CDH1*, CDK4, CDKN2A, CHEK2*, DICER1, MLH1*, MSH2*, MSH3, MSH6*, MUTYH*, NBN, NF1*, NTHL1, PALB2*, PMS2*, PTEN*, RAD51C*, RAD51D*, RECQL, SMAD4, SMARCA4, STK11 and TP53* (sequencing and deletion/duplication); HOXB13, POLD1 and POLE (sequencing only); EPCAM and GREM1 (deletion/duplication only). DNA and RNA analyses performed for * genes.    05/16/2019 Tumor Marker   Patient's tumor was tested for the following markers: CA-125 Results of the tumor marker test revealed 10.7   06/16/2019 Imaging   1. No evidence of residual or recurrent metastatic disease in the chest, abdomen or pelvis. 2. Masslike focus of consolidation in the right middle lobe along the major and minor fissures with associated volume loss is stable since 01/31/2019. Indolent primary pulmonary neoplasm not excluded. PET-CT may be considered for further characterization. 3. Aortic Atherosclerosis (ICD10-I70.0).   06/16/2019 Tumor Marker   Patient's tumor was tested for the following markers: CA-125 Results of the tumor marker test revealed 9.1.   12/05/2019 Tumor Marker   Patient's tumor was tested for the following markers: CA-125. Results of the tumor marker test revealed 9.0.   02/29/2020 Tumor Marker   Patient's tumor was tested for the following markers: CA-`125 Results of the tumor marker test revealed 9.7.   05/28/2020 Tumor Marker   Patient's tumor was tested for the following markers: CA-125 Results of the tumor marker test revealed 9.5   08/16/2020 Tumor Marker   Patient's tumor was tested for the following markers: CA-125 Results of the tumor marker test revealed 10.9   11/29/2020 Tumor Marker   Patient's tumor was tested for the following markers: CA-125. Results of the tumor marker test revealed 10.2.   01/22/2021 Pathology Results   SURGICAL PATHOLOGY  CASE: [phone]   PATIENT: CLARITA REEF  Surgical Pathology Report   Clinical History: brain metastasis (cm)  FINAL MICROSCOPIC DIAGNOSIS:   A. 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    03/20/2021 Imaging   1. Unchanged enlarged midline superior mediastinal and left retroperitoneal lymph nodes, which remain suspicious for metastatic disease. 2. Unchanged masslike consolidation and volume loss of the right middle lobe measuring 3.8 x 1.9 cm, likely chronic scarring. Continued attention on follow-up. 3. No new evidence of metastatic disease in the chest, abdomen, or pelvis. 4. Status post hysterectomy and omentectomy.   Aortic Atherosclerosis (ICD10-I70.0).     03/21/2021 - 05/07/2021 Chemotherapy   Patient is on Treatment Plan : OVARIAN RECURRENT 3RD LINE Carboplatin  D1 / Gemcitabine  D1,8 (4/800) q21d     05/17/2021 Imaging   1. No new suspicious mass or lymphadenopathy identified in the chest, abdomen or pelvis. 2. Interval decreased size of previous enlarged superior mediastinal and left retroperitoneal lymph nodes. 3. Other ancillary findings as described.   05/21/2021 - 08/13/2021 Chemotherapy   Patient is on Treatment Plan : OVARIAN Gemcitabine  D1,8 q21d      08/16/2021 Imaging   MRI brain  1. Regression of disease. Resolved marginal enhancement at the right middle frontal gyrus resection cavity and regressed enhancement at the treated left parietal lesion.   2. No new metastatic disease or acute intracranial abnormality.   08/19/2021 Imaging   Slight decrease in size of small left retroperitoneal and superior mediastinal lymph nodes.   No new or progressive disease within the chest, abdomen, or pelvis.   Aortic Atherosclerosis (ICD10-I70.0).     11/29/2021 Imaging   1. Slightly decreased size of the small left retroperitoneal lymph node with stable  superior mediastinal lymph node. 2. No new or progressive disease within the chest, abdomen or pelvis. 3.  Aortic Atherosclerosis (ICD10-I70.0).     03/27/2022 Imaging   1. No current findings of active malignancy. 2. Stable chronic atelectasis of the right middle lobe. 3. Mild aortoiliac atherosclerotic vascular calcification. 4. Degenerative subcortical cystic lesion along the left acetabulum. 5. Old healed left posterior sixth and seventh rib fractures. 6. Aortic atherosclerosis.   07/22/2022 Imaging   CT CHEST ABDOMEN PELVIS W CONTRAST  Result Date: 07/21/2022 CLINICAL DATA:  Restaging ovarian cancer.  * Tracking Code: BO *. EXAM: CT CHEST, ABDOMEN, AND PELVIS WITH CONTRAST TECHNIQUE: Multidetector CT imaging of the chest, abdomen and pelvis was performed following the standard protocol during bolus administration of intravenous contrast. RADIATION DOSE REDUCTION: This exam was performed according to the departmental dose-optimization program which includes automated exposure control, adjustment of the mA and/or kV according to patient size and/or use of iterative reconstruction technique. CONTRAST:  80mL OMNIPAQUE  IOHEXOL  300 MG/ML  SOLN COMPARISON:  03/25/2022 FINDINGS: CT CHEST FINDINGS Cardiovascular: Mild cardiac enlargement. No pericardial effusion. Aortic atherosclerotic calcifications. Mediastinum/Nodes: Thyroid  gland, trachea, and esophagus are unremarkable. No enlarged mediastinal or hilar lymph nodes. No axillary adenopathy. Lungs/Pleura: No pleural fluid. Stable appearance of atelectasis involving the right middle lobe. No suspicious pulmonary nodules identified. Musculoskeletal: T3 vertebral hemangioma. No aggressive or acute osseous findings. Remote left posterior rib fractures. CT ABDOMEN PELVIS FINDINGS Hepatobiliary: No focal liver abnormality is seen. No gallstones, gallbladder wall thickening, or biliary dilatation. Pancreas: Unremarkable. No pancreatic ductal dilatation or  surrounding inflammatory changes. Spleen: Normal in size without focal abnormality. Adrenals/Urinary Tract: Normal adrenal glands. No kidney mass or hydronephrosis identified. Urinary bladder is unremarkable. Stomach/Bowel: Stomach appears normal. No pathologic  dilatation of the large or small bowel loops. No bowel wall thickening or inflammation. Vascular/Lymphatic: Aortic atherosclerosis. No aneurysm. New soft tissue nodule or lymph node along the gastrohepatic ligament measures 0.7 cm, image 61/2. Adjacent nodule is also new measuring 0.8 cm, image 60/2. The previously referenced right gastric lymph node measuring 8 mm is unchanged, image 66/3. No enlarged pelvic or inguinal lymph nodes. Reproductive: Status post hysterectomy. No adnexal masses. Other: Status post prior omentectomy. No ascites. No discrete fluid collections identified. Musculoskeletal: No acute or significant osseous findings. IMPRESSION: 1. New soft tissue nodules or lymph nodes along the gastrohepatic ligament are identified measuring up to 0.8 cm. Recurrent peritoneal disease or nodal disease cannot be excluded. 2. Stable appearance of previously referenced right gastric lymph node measuring 8 mm. 3. No signs of metastatic disease to the chest. 4.  Aortic Atherosclerosis (ICD10-I70.0). Electronically Signed   By: Waddell Calk M.D.   On: 07/21/2022 09:47      11/09/2022 Tumor Marker   Patient's tumor was tested for the following markers: CA-125. Results of the tumor marker test revealed 8.2.   11/11/2022 Imaging   CT CHEST ABDOMEN PELVIS W CONTRAST  Result Date: 11/10/2022 CLINICAL DATA:  Ovarian cancer restaging.  * Tracking Code: BO *. EXAM: CT CHEST, ABDOMEN, AND PELVIS WITH CONTRAST TECHNIQUE: Multidetector CT imaging of the chest, abdomen and pelvis was performed following the standard protocol during bolus administration of intravenous contrast. RADIATION DOSE REDUCTION: This exam was performed according to the departmental  dose-optimization program which includes automated exposure control, adjustment of the mA and/or kV according to patient size and/or use of iterative reconstruction technique. CONTRAST:  80mL OMNIPAQUE  IOHEXOL  300 MG/ML  SOLN COMPARISON:  None Available. FINDINGS: CT CHEST FINDINGS Cardiovascular: The heart size is normal. No substantial pericardial effusion. Right Port-A-Cath tip is positioned at the SVC/RA junction. Mediastinum/Nodes: No mediastinal lymphadenopathy. There is no hilar lymphadenopathy. The esophagus has normal imaging features. There is no axillary lymphadenopathy. Lungs/Pleura: Biapical pleuroparenchymal scarring evident. Right middle lobe collapse/consolidation is stable. No new suspicious pulmonary nodule or mass. Musculoskeletal: No worrisome lytic or sclerotic osseous abnormality. CT ABDOMEN PELVIS FINDINGS Hepatobiliary: No suspicious focal abnormality within the liver parenchyma. There is no evidence for gallstones, gallbladder wall thickening, or pericholecystic fluid. No intrahepatic or extrahepatic biliary dilation. Pancreas: No focal mass lesion. No dilatation of the main duct. No intraparenchymal cyst. No peripancreatic edema. Spleen: No splenomegaly. No suspicious focal mass lesion. Adrenals/Urinary Tract: No adrenal nodule or mass. Kidneys unremarkable. No evidence for hydroureter. The urinary bladder appears normal for the degree of distention. Stomach/Bowel: Stomach is unremarkable. No gastric wall thickening. No evidence of outlet obstruction. Duodenum is normally positioned as is the ligament of Treitz. No small bowel wall thickening. No small bowel dilatation. The terminal ileum is normal. The appendix is normal. No gross colonic mass. No colonic wall thickening. Vascular/Lymphatic: There is mild atherosclerotic calcification of the abdominal aorta without aneurysm. 8 mm short axis lymph nodes in the gastrohepatic ligament (images 59 in 60 of series 2 are stable in the interval.  Small left para-aortic lymph nodes are stable. No retroperitoneal or mesenteric lymphadenopathy. No pelvic sidewall lymphadenopathy. Reproductive: Hysterectomy.  There is no adnexal mass. Other: No intraperitoneal free fluid. Musculoskeletal: No worrisome lytic or sclerotic osseous abnormality. IMPRESSION: 1. Stable exam. No new or progressive findings to suggest recurrent or metastatic disease in the chest, abdomen, or pelvis. 2. Small lymph nodes in the gastrohepatic ligament identified previously are stable in the interval.  Continued attention on follow-up recommended. 3. Stable right middle lobe collapse/consolidation. 4.  Aortic Atherosclerosis (ICD10-I70.0). Electronically Signed   By: Camellia Candle M.D.   On: 11/10/2022 14:58      03/24/2023 Imaging   CT CHEST ABDOMEN PELVIS W CONTRAST Result Date: 03/27/2023 CLINICAL DATA:  Staging ovarian cancer.  * Tracking Code: BO *. EXAM: CT CHEST, ABDOMEN, AND PELVIS WITH CONTRAST TECHNIQUE: Multidetector CT imaging of the chest, abdomen and pelvis was performed following the standard protocol during bolus administration of intravenous contrast. RADIATION DOSE REDUCTION: This exam was performed according to the departmental dose-optimization program which includes automated exposure control, adjustment of the mA and/or kV according to patient size and/or use of iterative reconstruction technique. CONTRAST:  OMNIPAQUE  IOHEXOL  300 MG/ML  SOLN COMPARISON:  11/06/2022 and older. FINDINGS: CT CHEST FINDINGS Cardiovascular: Stable mild cardiac enlargement. Trace pericardial fluid. The thoracic aorta has a normal course and caliber. Slight intimal thickening and plaque. Right upper chest port is accessed with the tip extending to the upper right atrium. Prominent pulmonary arteries. Mediastinum/Nodes: Preserved thyroid  gland. Slightly patulous thoracic esophagus. No specific abnormal lymph node enlargement seen in the axillary region, hilum mediastinum. Small right  anterior cardiophrenic angle node on series 2, image 50 is similar to previous measuring 5 mm in short axis. Lungs/Pleura: There is some linear opacity seen along bases likely scar or atelectasis. No pneumothorax or effusion. Stable juxtapleural left lower lobe 3 mm lung nodule on series 6, image 115. There is bandlike atelectasis seen along the posterosuperior aspect of the middle lobe. Also unchanged from previous. Minimal dependent atelectasis as well elsewhere. Musculoskeletal: No chest wall mass or suspicious bone lesions identified. CT ABDOMEN PELVIS FINDINGS Hepatobiliary: No focal liver abnormality is seen. Fatty liver infiltration diffusely. No gallstones, gallbladder wall thickening, or biliary dilatation. Pancreas: Unremarkable. No pancreatic ductal dilatation or surrounding inflammatory changes. Spleen: Normal in size without focal abnormality. Adrenals/Urinary Tract: Adrenal glands are unremarkable. Kidneys are normal, without renal calculi, focal lesion, or hydronephrosis. Bladder is unremarkable. Stomach/Bowel: Stomach is within normal limits. Appendix appears normal. No evidence of bowel wall thickening, distention, or inflammatory changes. Vascular/Lymphatic: Aortic atherosclerosis. Circumaortic renal vein. Abnormal nodes again seen in the upper abdomen.One is slightly larger now measuring 12 mm on image 62 of series 2. Previously 8 mm. Second small node just medial and superior to the first is similar at 8 mm on image 60. No new lymph node enlargement elsewhere. Few nonpathologic retroperitoneal nodes more caudal near the aortic bifurcation. Reproductive: Status post hysterectomy. No adnexal masses. Other: No abdominal wall hernia or abnormality. No abdominopelvic ascites. Musculoskeletal: No acute or significant osseous findings. IMPRESSION: Upper abdominal abnormal nodes are again seen. One is slightly larger today than previous. No new nodal enlargement. No developing ascites. Fatty liver  infiltration. Stable segmental collapse of the middle lobe. Electronically Signed   By: Ranell Bring M.D.   On: 03/27/2023 13:53      03/26/2023 Tumor Marker   Patient's tumor was tested for the following markers: CA-125. Results of the tumor marker test revealed 8.9.   06/29/2023 Imaging   *No acute findings in the abdomen or pelvis. *Moderate amount of residual fecal material throughout the colon without obstruction or constipation. *Status post hysterectomy. No adnexal masses.      CNS Oncologic History 01/22/21: R frontal craniotomy, resection with Dr. Carollee 02/21/21: Post-op SRS and SRS to L parietal target Cinda)  Interval History: TRINE FREAD presents for follow up after recent  MRI brain. No reported seizures or auras since prior visit.  Does have ongoing short term memory issues, fatigue, overall unchanged from prior.  Continues on Keppra  1000mg  twice per day.  Continues on observation with Dr. Lonn.  H+P (12/25/20) Patient presented to the ED on 12/22/20 with twitching of her left side of her face for 1 to 2 minutes, accompanied by slurring of speech.  There was no tongue biting, bowel or bladder incontinence.  Workup demonstrated two brain lesions consistent with suspected metastases.  She was started on Keppra  (no decadron ) and discharged two days later with no further seizure like events.  Priro to admission she had also been complaining of dizziness, confusion, impaired memory.  Has been seeing Dr. Lonn since 2021 for primary peritoneal carcinoma.   Medications: Current Outpatient Medications on File Prior to Visit  Medication Sig Dispense Refill   Cholecalciferol ([employer] VITAMIN D3 DROPS/INFANT PO) Take 5,000 Units by mouth daily.     gabapentin  (NEURONTIN ) 300 MG capsule Take 300 mg by mouth 3 (three) times daily.     levETIRAcetam  (KEPPRA ) 500 MG tablet TAKE 2 TABLETS(1000 MG) BY MOUTH TWICE DAILY 120 tablet 3   lidocaine -prilocaine  (EMLA ) cream Apply to  affected area(s) as needed. 30 g 3   LORazepam  (ATIVAN ) 2 MG tablet Take 1 tablet (2 mg total) by mouth every 6 (six) hours as needed for seizure. 10 tablet 0   Azelaic Acid  15 % cream Apply 1 application topically daily.     cholecalciferol (VITAMIN D3) 25 MCG (1000 UNIT) tablet Take 5,000 Units by mouth daily.     No current facility-administered medications on file prior to visit.    Allergies:  Allergies  Allergen Reactions   Carboplatin  Itching and Rash    Patient has a hypersensitivity reaction to carboplatin .  See note from 04/30/2021 at 1439.  Reaction symptoms included itching of mouth and ears, burning in chest, hypotension, and rash to neck and arms.  Patient unable to complete infusion.   Fentanyl  Palpitations    Palpitations , fever ,headahe, N/V   loud pounding heart beat    Vancomycin  Rash   Milk (Cow) Diarrhea and Nausea And Vomiting   Brimonidine Tartrate     Other reaction(s): blurred vision   Brimonidine Tartrate     Other reaction(s): blurred vision   Ditropan [Oxybutynin]    Milk-Related Compounds Diarrhea and Nausea And Vomiting   Other Other (See Comments)   Oxybutynin Chloride Other (See Comments)    Ditropan  Pt doesn't remember    Thimerosal (Thiomersal) Other (See Comments)    Pt doesn't remember   Tramadol Other (See Comments)    Pt doesn't remember   Avelox [Moxifloxacin Hcl In Nacl] Rash   Doxycycline Rash   Erythromycin Rash   Metronidazole Rash   Minocycline Rash   Penicillins Rash    Did it involve swelling of the face/tongue/throat, SOB, or low BP? No Did it involve sudden or severe rash/hives, skin peeling, or any reaction on the inside of your mouth or nose? Yes, rash. Did you need to seek medical attention at a hospital or doctor's office? No When did it last happen?      Childhood allergy If all above answers are NO, may proceed with cephalosporin use.    Quinolones Rash   Sulfonamide Derivatives Rash   Past Medical History:   Past Medical History:  Diagnosis Date   Allergic rhinitis    Aortic atherosclerosis (HCC)    Aortic insufficiency  mild by echo 04/2023   Cervical dysplasia 1980   Cervicalgia    Eczema    Rosacea,dermatitis-Dr Stinehelfer   Esophageal reflux    Family history of colon cancer    Family history of melanoma    Family history of ovarian cancer    Family history of pancreatic cancer    Family history of prostate cancer    Family history of uterine cancer    Fibromyalgia    GERD (gastroesophageal reflux disease)    Glaucoma, narrow-angle    Heart murmur    Hiatal hernia    Irritable bowel syndrome    LBBB (left bundle branch block)    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), unspecified site    DCIS   Mass of lung    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   Mitral valve disorders(424.0)    No MVP and trivial MR by echo 10/2021   Osteoporosis 12/2016   T score -2.3, 2014 T score -2.5 AP spine   PAC (premature atrial contraction) 2012   ,PVC's, and nonsustained atril tachycardia w aberration by heart monitor    Pulmonic valve insufficiency    mild by echo 8/23   PVC (premature ventricular contraction)    Rosacea    Stricture and stenosis of esophagus    Tricuspid insufficiency    mild by echo 10/2021   Past Surgical History:  Past Surgical History:  Procedure Laterality Date   APPLICATION OF CRANIAL NAVIGATION N/A 01/22/2021   Procedure: APPLICATION OF CRANIAL NAVIGATION;  Surgeon: Carollee Lani BROCKS, DO;  Location: [facility] OR;  Service: Neurosurgery;  Laterality: N/A;   BREAST LUMPECTOMY  1998   right breast with radiation   CERVICAL DISCECTOMY     C3-7 with bone graft, in three different surgeries, have titanium plate and screws   CRANIOTOMY N/A 01/22/2021   Procedure: STEREOTACTIC RIGHT FRONTAL CRANIOTOMY FOR RESECTION OF TUMOR;  Surgeon: Carollee Lani BROCKS, DO;  Location: [facility] OR;  Service: Neurosurgery;  Laterality: N/A;   GYNECOLOGIC CRYOSURGERY  1980   IR IMAGING GUIDED PORT  INSERTION  12/24/2018   NECK SURGERY  1993-1997   bone graft and fusion   OMENTECTOMY  02/17/2019   REFRACTIVE SURGERY     narrow angle glucoma   RHINOPLASTY  1976   sterotactic large core needle biopsy right breast Right 10/15/1996   TUBAL LIGATION  1977   Social History:  Social History   Socioeconomic History   Marital status: Married    Spouse name: Dale   Number of children: 0   Years of education: Not on file   Highest education level: Not on file  Occupational History   Occupation: Educational psychologist: [employer]  Tobacco Use   Smoking status: Never   Smokeless tobacco: Never  Vaping Use   Vaping status: Never Used  Substance and Sexual Activity   Alcohol use: Yes    Alcohol/week: 2.0 standard drinks of alcohol    Types: 2 Standard drinks or equivalent per week    Comment: Social    Drug use: No   Sexual activity: Not Currently    Birth control/protection: Post-menopausal    Comment: 1st intercourse 73 yo- More than 5 partners  Other Topics Concern   Not on file  Social History Narrative   Not on file   Social Drivers of Health   Financial Resource Strain: Not on file  Food Insecurity: Not on file  Transportation Needs: Not on file  Physical  Activity: Not on file  Stress: Not on file  Social Connections: Unknown (07/23/2021)   [facility]   Social Network    Social Network: Not on file  Intimate Partner Violence: Unknown (06/21/2021)   Received from [facility]   HITS    Physically Hurt: Not on file    Insult or Talk Down To: Not on file    Threaten Physical Harm: Not on file    Scream or Curse: Not on file   Family History:  Family History  Problem Relation Age of Onset   Heart disease Mother    Depression Mother    Hypertension Mother    Heart disease Father    Hypertension Father    Aneurysm Father    Uterine cancer Sister 81   Hypertension Sister    Ovarian cancer Sister 1   Melanoma Sister 50    Depression Sister    Melanoma Sister 72   Heart disease Paternal Grandfather    Stroke Maternal Grandmother    Cancer Maternal Grandfather 50       Pancreatic   Stroke Paternal Grandmother    Cancer Paternal Uncle        multiple myeloma, prostate cancer and melanoma (separate primaries)   Colon cancer Neg Hx     Review of Systems: Constitutional: Doesn't report fevers, chills or abnormal weight loss Eyes: Doesn't report blurriness of vision Ears, nose, mouth, throat, and face: Doesn't report sore throat Respiratory: Doesn't report cough, dyspnea or wheezes Cardiovascular: Doesn't report palpitation, chest discomfort  Gastrointestinal:  Doesn't report nausea, constipation, diarrhea GU: Doesn't report incontinence Skin: Doesn't report skin rashes Neurological: Per HPI Musculoskeletal: Doesn't report joint pain Behavioral/Psych: Doesn't report anxiety  Physical Exam: Wt Readings from Last 3 Encounters:  11/24/23 94 lb 6.4 oz (42.8 kg)  09/22/23 94 lb 3.2 oz (42.7 kg)  08/11/23 95 lb 12.8 oz (43.5 kg)   Temp Readings from Last 3 Encounters:  11/24/23 97.7 F (36.5 C)  09/22/23 (!) 97.3 F (36.3 C)  08/24/23 98.1 F (36.7 C) (Oral)   BP Readings from Last 3 Encounters:  11/24/23 129/64  09/22/23 122/68  08/24/23 (!) 125/48   Pulse Readings from Last 3 Encounters:  11/24/23 71  09/22/23 75  08/24/23 62   KPS: 80. General: Alert, cooperative, pleasant, in no acute distress Head: Normal EENT: No conjunctival injection or scleral icterus.  Lungs: Resp effort normal Cardiac: Regular rate Abdomen: Non-distended abdomen Skin: No rashes cyanosis or petechiae. Extremities: No clubbing or edema  Neurologic Exam: Mental Status: Awake, alert, attentive to examiner. Oriented to self and environment. Language is fluent with intact comprehension.  Cranial Nerves: Visual acuity is grossly normal. Visual fields are full. Extra-ocular movements intact. No ptosis. Face is  symmetric Motor: Tone and bulk are normal. Power is full in both arms and legs. Reflexes are symmetric, no pathologic reflexes present.  Sensory: Intact to light touch Gait: Normal.   Labs: I have reviewed the data as listed    Component Value Date/Time   NA 138 06/29/2023 0732   K 3.9 06/29/2023 0732   CL 102 06/29/2023 0732   CO2 29 06/29/2023 0732   GLUCOSE 90 06/29/2023 0732   BUN 9 06/29/2023 0732   CREATININE 0.52 06/29/2023 0732   CREATININE 0.57 11/06/2022 0741   CALCIUM 9.5 06/29/2023 0732   PROT 7.3 06/29/2023 0732   ALBUMIN 4.5 06/29/2023 0732   AST 25 06/29/2023 0732   AST 33 11/06/2022 0741  ALT 17 06/29/2023 0732   ALT 25 11/06/2022 0741   ALKPHOS 63 06/29/2023 0732   BILITOT 0.7 06/29/2023 0732   BILITOT 0.5 11/06/2022 0741   GFRNONAA >60 06/29/2023 0732   GFRNONAA >60 11/06/2022 0741   GFRAA >60 12/05/2019 1145   Lab Results  Component Value Date   WBC 5.6 06/29/2023   NEUTROABS 3.4 06/29/2023   HGB 12.0 06/29/2023   HCT 35.1 (L) 06/29/2023   MCV 89.1 06/29/2023   PLT 224 06/29/2023    Imaging:  [facility] Clinician Interpretation: I have personally reviewed the CNS images as listed.  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 EXAM: MRI HEAD WITHOUT AND WITH CONTRAST TECHNIQUE: Multiplanar, multiecho pulse sequences of the brain and surrounding structures were obtained without and with intravenous contrast. CONTRAST:  Vueway  5 mL IV COMPARISON:  MRI of the head dated September 17, 2023. FINDINGS: Brain: There has been mild interval worsening of curvilinear nodular enhancement along the medial and posterior margins of the resection cavity in the right posterior frontal lobe. There is a stable focal area enhancement in the subcortical white matter of the left parietal lobe on  images 106 and 107 of series 14. There is age related atrophy and mild periventricular and deep cerebral white matter disease. There is a confluent zones of increased T2 signal adjacent to the treated lesion in the right posterior frontal lobe, which is unchanged. Normal flow voids. Skull and upper cervical spine: Status post right parietal craniotomy. Sinuses/Orbits: Negative. Other: Negative. IMPRESSION: 1. Mild interval worsening of curvilinear nodular enhancement along the medial and posterior margins of the patient's right posterior frontal resection cavity, concerning for residual or recurrent metastatic neoplasm. 2. Stable focal area of nodular enhancement in the subcortical white matter of the left parietal lobe, compatible with previously treated metastatic lesion. Electronically Signed   By: Evalene Coho M.D.   On: 11/19/2023 12:07     Assessment/Plan Secondary malignant neoplasm of brain [facility])  MEKHIA BROGAN is clinically stable today.  MRI brain again demonstrates small focus of enhancement immediately adjacent to right frontal resection cavity, this continues to grow very slowly now over 4 consecutive studies.  Etiology remains unclear; recurrent tumor vs delayed radiation effect or blood brain barrier contrast permeability.  There is no mass effect or increase in T2 signal abnormality.  Based on discussion in CNS tumor board, recommended close imaging surveillance at this time, with repeat MRI brain in 2 months.  She is agreeable to treat this with SRS if growth is demonstrated on the next study.  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    Will con't to follow with Dr. Lonn, remains on observation.  We appreciate the opportunity to participate in the care of DORYCE MCGREGORY.    We ask that ANTONIO WOODHAMS return to clinic in 2 months following next brain MRI, or sooner as needed.    All questions were answered. The patient knows to call the clinic with  any problems, questions or concerns. No barriers to learning were detected.  The total time spent in the encounter was 40 minutes and more than 50% was on counseling and review of test results   Arthea MARLA Manns, MD Medical Director of Neuro-Oncology [facility] 11/24/23 10:21 AM

## 2023-11-25 DIAGNOSIS — Z23 Encounter for immunization: Secondary | ICD-10-CM | POA: Diagnosis not present

## 2023-11-30 ENCOUNTER — Telehealth: Payer: Self-pay

## 2023-11-30 NOTE — Telephone Encounter (Signed)
 S/w patient regarding whether or not oral contrast would be required for next CT scan expected to be scheduled in January 2026. Patient aware that Dr. Lonn confirmed that she would not need oral contrast at this time.  Patient verbalized an understanding of the information and voiced appreciation for the call.

## 2023-12-14 ENCOUNTER — Inpatient Hospital Stay

## 2023-12-14 DIAGNOSIS — C482 Malignant neoplasm of peritoneum, unspecified: Secondary | ICD-10-CM | POA: Diagnosis not present

## 2023-12-14 DIAGNOSIS — G319 Degenerative disease of nervous system, unspecified: Secondary | ICD-10-CM | POA: Diagnosis not present

## 2023-12-14 DIAGNOSIS — M810 Age-related osteoporosis without current pathological fracture: Secondary | ICD-10-CM | POA: Diagnosis not present

## 2023-12-14 DIAGNOSIS — Z452 Encounter for adjustment and management of vascular access device: Secondary | ICD-10-CM | POA: Diagnosis not present

## 2023-12-14 DIAGNOSIS — D1809 Hemangioma of other sites: Secondary | ICD-10-CM | POA: Diagnosis not present

## 2023-12-14 DIAGNOSIS — C7931 Secondary malignant neoplasm of brain: Secondary | ICD-10-CM | POA: Diagnosis not present

## 2023-12-16 DIAGNOSIS — Z23 Encounter for immunization: Secondary | ICD-10-CM | POA: Diagnosis not present

## 2024-01-07 DIAGNOSIS — E559 Vitamin D deficiency, unspecified: Secondary | ICD-10-CM | POA: Diagnosis not present

## 2024-01-11 ENCOUNTER — Other Ambulatory Visit: Payer: Self-pay | Admitting: Internal Medicine

## 2024-01-14 ENCOUNTER — Encounter: Payer: Self-pay | Admitting: Hematology and Oncology

## 2024-01-14 DIAGNOSIS — D2261 Melanocytic nevi of right upper limb, including shoulder: Secondary | ICD-10-CM | POA: Diagnosis not present

## 2024-01-14 DIAGNOSIS — L821 Other seborrheic keratosis: Secondary | ICD-10-CM | POA: Diagnosis not present

## 2024-01-14 DIAGNOSIS — L814 Other melanin hyperpigmentation: Secondary | ICD-10-CM | POA: Diagnosis not present

## 2024-01-14 DIAGNOSIS — D2271 Melanocytic nevi of right lower limb, including hip: Secondary | ICD-10-CM | POA: Diagnosis not present

## 2024-01-14 DIAGNOSIS — D225 Melanocytic nevi of trunk: Secondary | ICD-10-CM | POA: Diagnosis not present

## 2024-01-14 DIAGNOSIS — D2239 Melanocytic nevi of other parts of face: Secondary | ICD-10-CM | POA: Diagnosis not present

## 2024-01-14 DIAGNOSIS — L718 Other rosacea: Secondary | ICD-10-CM | POA: Diagnosis not present

## 2024-01-14 DIAGNOSIS — D1801 Hemangioma of skin and subcutaneous tissue: Secondary | ICD-10-CM | POA: Diagnosis not present

## 2024-01-15 ENCOUNTER — Emergency Department (HOSPITAL_BASED_OUTPATIENT_CLINIC_OR_DEPARTMENT_OTHER)

## 2024-01-15 ENCOUNTER — Telehealth: Payer: Self-pay

## 2024-01-15 ENCOUNTER — Encounter (HOSPITAL_BASED_OUTPATIENT_CLINIC_OR_DEPARTMENT_OTHER): Payer: Self-pay | Admitting: Emergency Medicine

## 2024-01-15 ENCOUNTER — Other Ambulatory Visit: Payer: Self-pay

## 2024-01-15 ENCOUNTER — Emergency Department (HOSPITAL_BASED_OUTPATIENT_CLINIC_OR_DEPARTMENT_OTHER)
Admission: EM | Admit: 2024-01-15 | Discharge: 2024-01-15 | Disposition: A | Discharge status: Home / Self Care | Source: Ambulatory Visit | Attending: Emergency Medicine | Admitting: Emergency Medicine

## 2024-01-15 DIAGNOSIS — D7389 Other diseases of spleen: Secondary | ICD-10-CM | POA: Insufficient documentation

## 2024-01-15 DIAGNOSIS — N131 Hydronephrosis with ureteral stricture, not elsewhere classified: Secondary | ICD-10-CM | POA: Insufficient documentation

## 2024-01-15 DIAGNOSIS — R10A2 Flank pain, left side: Secondary | ICD-10-CM

## 2024-01-15 DIAGNOSIS — Z85841 Personal history of malignant neoplasm of brain: Secondary | ICD-10-CM | POA: Insufficient documentation

## 2024-01-15 DIAGNOSIS — Z8589 Personal history of malignant neoplasm of other organs and systems: Secondary | ICD-10-CM | POA: Diagnosis not present

## 2024-01-15 LAB — URINALYSIS, ROUTINE W REFLEX MICROSCOPIC
Bacteria, UA: NONE SEEN
Bilirubin Urine: NEGATIVE
Glucose, UA: NEGATIVE mg/dL
Ketones, ur: NEGATIVE mg/dL
Leukocytes,Ua: NEGATIVE
Nitrite: NEGATIVE
Specific Gravity, Urine: 1.017 (ref 1.005–1.030)
pH: 5.5 (ref 5.0–8.0)

## 2024-01-15 LAB — CBC
HCT: 39.3 % (ref 36.0–46.0)
Hemoglobin: 13.1 g/dL (ref 12.0–15.0)
MCH: 30 pg (ref 26.0–34.0)
MCHC: 33.3 g/dL (ref 30.0–36.0)
MCV: 90.1 fL (ref 80.0–100.0)
Platelets: 278 K/uL (ref 150–400)
RBC: 4.36 MIL/uL (ref 3.87–5.11)
RDW: 12.8 % (ref 11.5–15.5)
WBC: 10.4 K/uL (ref 4.0–10.5)
nRBC: 0 % (ref 0.0–0.2)

## 2024-01-15 LAB — LIPASE, BLOOD: Lipase: 25 U/L (ref 11–51)

## 2024-01-15 LAB — COMPREHENSIVE METABOLIC PANEL WITH GFR
ALT: 15 U/L (ref 0–44)
AST: 25 U/L (ref 15–41)
Albumin: 4.8 g/dL (ref 3.5–5.0)
Alkaline Phosphatase: 94 U/L (ref 38–126)
Anion gap: 13 (ref 5–15)
BUN: 9 mg/dL (ref 8–23)
CO2: 28 mmol/L (ref 22–32)
Calcium: 10.4 mg/dL — ABNORMAL HIGH (ref 8.9–10.3)
Chloride: 101 mmol/L (ref 98–111)
Creatinine, Ser: 0.59 mg/dL (ref 0.44–1.00)
GFR, Estimated: >60 mL/min (ref >60)
Glucose, Bld: 100 mg/dL — ABNORMAL HIGH (ref 70–99)
Potassium: 3.6 mmol/L (ref 3.5–5.1)
Sodium: 141 mmol/L (ref 135–145)
Total Bilirubin: 0.7 mg/dL (ref 0.0–1.2)
Total Protein: 8.1 g/dL (ref 6.5–8.1)

## 2024-01-15 MED ORDER — IOHEXOL 300 MG/ML  SOLN: 100.0000 mL | Freq: Once | INTRAMUSCULAR | Status: DC | PRN

## 2024-01-15 MED ORDER — LACTATED RINGERS IV BOLUS
500.0000 mL | Freq: Once | INTRAVENOUS | Status: AC
  Administered 2024-01-15: 500 mL via INTRAVENOUS

## 2024-01-15 NOTE — ED Notes (Signed)
 Patient transported to CT

## 2024-01-15 NOTE — Telephone Encounter (Signed)
 Called her back. Dr. Viktoria is unable to see her any earlier then scheduled appt. Instructed [name] [name] to go to the ER now. She will go to [facility] ER  this am to be evaluated.

## 2024-01-15 NOTE — ED Notes (Signed)
D&C IV 

## 2024-01-15 NOTE — ED Triage Notes (Signed)
 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 Pain is worse at night but improves as the day goes on.

## 2024-01-15 NOTE — Discharge Instructions (Addendum)
 You were seen today for left flank pain.  This correlates with the hydronephrosis seen on your CT scan.  Please call [facility] for follow-up next week. Return if you are having any new or worsening symptoms. Please follow-up with your oncologist regarding the new splenic lesion noted on the CT scan. Return if you are having any new or worsening symptoms. Your calcium was slightly elevated above normal at 10.4 with the upper limit of normal 10.3 today.  Please have this rechecked with your oncologist next week

## 2024-01-15 NOTE — Telephone Encounter (Signed)
 Returned her call. She has been having symptoms and feeling like she did prior to cancer diagnosis. Denies bloating but abdomen feels tight. Denies constipation and she may not be eating as well due to lack of appetite.  On Saturday/ Tuesday and Thursday night she had intense abdominal pain with nausea/ dry heaves. Denies vomiting.  The abdominal pain is on the left side of her abdomen from front to back. During the day her abdomen feels tender. She is taking her gabapentin  as usual and took Advil 1 night that did not help. She almost went to the ER because the pain was so bad but decided to call the office for recommendation.  She sees Dr. Viktoria on 11/13 and CT scan is scheduled on 1/15 with no oral contrast.

## 2024-01-15 NOTE — Telephone Encounter (Signed)
 I am booked today and post call I have to see patients in both hospitals Next week I am booked every day  I am not sure if Dr. Viktoria can see her sooner or she might need to go to the ER

## 2024-01-15 NOTE — ED Provider Notes (Signed)
 [place] EMERGENCY DEPARTMENT AT [facility] Provider Note   CSN: [id number] Arrival date & time: 01/15/24  1051     Patient presents with: Abdominal Pain   [name] [name] is a 73 y.o. female.   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  She has had pain in the left flank intermittently over the past week at night.  She describes pain on 3 nights this week with last occurring last night.  She describes it as resolving although some residual tenderness during the day.  She denies nausea, vomiting, diarrhea, urinary tract infectious symptoms.  Her weight has been stable.  She has not had a fever or chills.  She called her oncologist this morning and instructed to come to the ED.  Currently she is pain-free     Prior to Admission medications   Medication Sig Start Date End Date Taking? Authorizing Provider  Azelaic Acid  15 % cream Apply 1 application topically daily.    [provider]  Cholecalciferol ([employer] VITAMIN D3 DROPS/INFANT PO) Take 5,000 Units by mouth daily.    [provider]  cholecalciferol (VITAMIN D3) 25 MCG (1000 UNIT) tablet Take 5,000 Units by mouth daily.    [provider]  gabapentin  (NEURONTIN ) 300 MG capsule Take 300 mg by mouth 3 (three) times daily. 04/05/13   [provider]  levETIRAcetam  (KEPPRA ) 500 MG tablet TAKE 2 TABLETS(1000 MG) BY MOUTH TWICE DAILY 01/14/24   Vaslow, [name] K, MD  lidocaine -prilocaine  (EMLA ) cream Apply to affected area(s) as needed. 02/02/23   Lonn Hicks, MD  LORazepam  (ATIVAN ) 2 MG tablet Take 1 tablet (2 mg total) by mouth every 6 (six) hours as needed for seizure. 03/31/22   Vaslow, [name] K, MD    Allergies: Carboplatin , Fentanyl , Vancomycin , Milk (cow), Brimonidine tartrate, Brimonidine tartrate, Ditropan [oxybutynin], Milk-related compounds, Other, Oxybutynin chloride, Thimerosal (thiomersal), Tramadol, Avelox [moxifloxacin hcl in nacl], Doxycycline,  Erythromycin, Metronidazole, Minocycline, Penicillins, Quinolones, and Sulfonamide derivatives    Review of Systems  Updated Vital Signs BP 126/81 (BP Location: Right Arm)   Pulse 70   Temp 98.2 F (36.8 C) (Oral)   Resp 18   SpO2 100%   Physical Exam Vitals reviewed.  Constitutional:      Appearance: She is well-developed.  HENT:     Head: Normocephalic.     Mouth/Throat:     Mouth: Mucous membranes are moist.  Eyes:     Extraocular Movements: Extraocular movements intact.  Cardiovascular:     Rate and Rhythm: Regular rhythm. Tachycardia present.  Pulmonary:     Effort: Pulmonary effort is normal.     Breath sounds: Normal breath sounds.  Abdominal:     General: Abdomen is flat. Bowel sounds are normal.     Palpations: Abdomen is soft.     Tenderness: There is no abdominal tenderness. There is no right CVA tenderness, left CVA tenderness or guarding.     Hernia: No hernia is present.  Skin:    General: Skin is warm and dry.     Capillary Refill: Capillary refill takes less than 2 seconds.  Neurological:     General: No focal deficit present.     Mental Status: She is alert.  Psychiatric:        Mood and Affect: Mood normal.     (all labs ordered are listed, but only abnormal results are displayed) Labs Reviewed  COMPREHENSIVE METABOLIC PANEL WITH GFR - Abnormal; Notable for the following components:  Result Value   Glucose, Bld 100 (*)    Calcium 10.4 (*)    All other components within normal limits  URINALYSIS, ROUTINE W REFLEX MICROSCOPIC - Abnormal; Notable for the following components:   Hgb urine dipstick SMALL (*)    Protein, ur TRACE (*)    All other components within normal limits  LIPASE, BLOOD  CBC    EKG: None  Radiology: No results found.   Procedures   Medications Ordered in the ED  lactated ringers  bolus 500 mL ( Intravenous Stopped 01/15/24 1224)    Clinical Course as of 01/17/24 1446  Fri Jan 15, 2024  1224 CBC reviewed  interpreted within normal limits Complete metabolic panel reviewed interpreted for mild hypercalcemia with calcium 10.4 with upper limit of normal 10.3 Lipase reviewed interpreted and normal [DR]    Clinical Course User Index [DR] Levander Houston, MD                                 Medical Decision Making Amount and/or Complexity of Data Reviewed Labs: ordered. Radiology: ordered.  Risk Prescription drug management.  73 year old female history of peritoneal cancer with mets, with brain mets presents today complaining of abdominal pain and nausea.  On exam patient is soft no point tenderness is noted.  Patient was evaluated with labs, imaging, and physical exam. CBC is normal Some mild hypercalcemia is noted on her complete metabolic panel CT was obtained and is significant for Mild left sided hydroureteronephrosis with abrupt narrowing in the distal left ureter along the left pelvic wall. Patient is advised regarding follow-up for this.  Nonemergent CT urogram is recommended.  Patient is given referral to urology New hypoattenuating lesion noted in the splenic hilum.  Patient is advised of this and need for follow-up with oncologist Patient has remained hemodynamically stable here in the ED.  We have discussed the above.  She understands and need for follow-up. We have discussed return precautions and need for follow-up and she voices understanding.     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

## 2024-01-18 ENCOUNTER — Telehealth: Payer: Self-pay

## 2024-01-18 NOTE — Telephone Encounter (Signed)
 Called and scheduled appt with Dr. Lonn on 11/10 at 1040 for 40 mins. She is aware of appt.

## 2024-01-21 DIAGNOSIS — H9202 Otalgia, left ear: Secondary | ICD-10-CM | POA: Diagnosis not present

## 2024-01-21 DIAGNOSIS — H6122 Impacted cerumen, left ear: Secondary | ICD-10-CM | POA: Diagnosis not present

## 2024-01-22 DIAGNOSIS — R1010 Upper abdominal pain, unspecified: Secondary | ICD-10-CM | POA: Diagnosis not present

## 2024-01-22 DIAGNOSIS — N1330 Unspecified hydronephrosis: Secondary | ICD-10-CM | POA: Diagnosis not present

## 2024-01-25 ENCOUNTER — Encounter: Payer: Self-pay | Admitting: Hematology and Oncology

## 2024-01-25 ENCOUNTER — Inpatient Hospital Stay: Attending: Gynecologic Oncology | Admitting: Hematology and Oncology

## 2024-01-25 VITALS — BP 133/61 | HR 74 | Temp 98.9°F | Resp 18 | Ht 65.0 in | Wt 91.0 lb

## 2024-01-25 DIAGNOSIS — Z90722 Acquired absence of ovaries, bilateral: Secondary | ICD-10-CM | POA: Diagnosis not present

## 2024-01-25 DIAGNOSIS — Z9221 Personal history of antineoplastic chemotherapy: Secondary | ICD-10-CM | POA: Diagnosis not present

## 2024-01-25 DIAGNOSIS — Z90710 Acquired absence of both cervix and uterus: Secondary | ICD-10-CM | POA: Insufficient documentation

## 2024-01-25 DIAGNOSIS — C482 Malignant neoplasm of peritoneum, unspecified: Secondary | ICD-10-CM

## 2024-01-25 DIAGNOSIS — R10A2 Flank pain, left side: Secondary | ICD-10-CM | POA: Diagnosis not present

## 2024-01-25 DIAGNOSIS — Z8544 Personal history of malignant neoplasm of other female genital organs: Secondary | ICD-10-CM | POA: Diagnosis present

## 2024-01-25 DIAGNOSIS — Z923 Personal history of irradiation: Secondary | ICD-10-CM | POA: Insufficient documentation

## 2024-01-25 DIAGNOSIS — Z9079 Acquired absence of other genital organ(s): Secondary | ICD-10-CM | POA: Insufficient documentation

## 2024-01-25 DIAGNOSIS — R634 Abnormal weight loss: Secondary | ICD-10-CM | POA: Diagnosis not present

## 2024-01-25 NOTE — Assessment & Plan Note (Addendum)
 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 04/30/21: She developed carboplatin  allergy, completed all chemotherapy by May 2023, with complete response with no evidence of disease   I have reviewed multiple imaging studies with the patient Her recent imaging study was done without contrast The abnormal area near her spleen that was noted appears to be present from her January scan Overall, it is not consistent with pattern of recurrence Plan to keep her appointment as scheduled in January for blood work and imaging study

## 2024-01-25 NOTE — Assessment & Plan Note (Addendum)
 Her recent flank pain has resolved She is following up with urologist in regards to recent hydronephrosis

## 2024-01-25 NOTE — Assessment & Plan Note (Addendum)
 She appears to have lost weight since the last time we met We discussed importance of frequent small meals, high-protein intake and resistance training as tolerated

## 2024-01-25 NOTE — Progress Notes (Signed)
 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 04/30/21: She developed carboplatin  allergy, completed all chemotherapy by May 2023, with complete response with no evidence of disease   I have reviewed multiple imaging studies with the patient Her recent imaging study was done without contrast The abnormal area near her spleen that was noted appears to be present from her January scan Overall, it is not consistent with pattern of recurrence Plan to keep her appointment as scheduled in January for blood work and imaging study Left flank pain Her recent flank pain has resolved She is following up with urologist in regards to recent hydronephrosis Weight loss, non-intentional She appears to have lost weight since the last time we met We discussed importance of frequent small meals, high-protein intake and resistance training as tolerated  No orders of the defined types were placed in this encounter.    Almarie Bedford, MD  INTERVAL HISTORY: she returns for surveillance follow-up after recent ER visit She was complaining of severe left flank pain leading to an ER visit CT imaging was done without contrast which showed mild hydronephrosis but no signs of kidney stone There is an area of abnormality near the spleen and she is being seen today to address that In the meantime, she appears to have lost weight According  to the patient, she attempted to eat whenever possible but has minimal appetite She was told to take MiraLAX due to signs of constipation on her CT imaging  PHYSICAL EXAMINATION: ECOG PERFORMANCE STATUS: 1 - Symptomatic but completely ambulatory  Vitals:   01/25/24 1030  BP: 133/61  Pulse: 74  Resp: 18  Temp: 98.9 F (37.2 C)  SpO2: 100%   Filed Weights   01/25/24 1030  Weight: 91 lb (41.3 kg)    Relevant data reviewed during this visit included CT imaging from January 2025 and November 2025

## 2024-01-28 ENCOUNTER — Encounter: Payer: Self-pay | Admitting: Gynecologic Oncology

## 2024-01-28 ENCOUNTER — Inpatient Hospital Stay

## 2024-01-28 ENCOUNTER — Inpatient Hospital Stay: Admitting: Gynecologic Oncology

## 2024-01-28 VITALS — BP 134/58 | HR 78 | Temp 98.4°F | Resp 18 | Wt 92.2 lb

## 2024-01-28 DIAGNOSIS — Z8544 Personal history of malignant neoplasm of other female genital organs: Secondary | ICD-10-CM | POA: Diagnosis not present

## 2024-01-28 DIAGNOSIS — Z90722 Acquired absence of ovaries, bilateral: Secondary | ICD-10-CM | POA: Diagnosis not present

## 2024-01-28 DIAGNOSIS — Z9221 Personal history of antineoplastic chemotherapy: Secondary | ICD-10-CM | POA: Diagnosis not present

## 2024-01-28 DIAGNOSIS — Z923 Personal history of irradiation: Secondary | ICD-10-CM | POA: Diagnosis not present

## 2024-01-28 DIAGNOSIS — C482 Malignant neoplasm of peritoneum, unspecified: Secondary | ICD-10-CM

## 2024-01-28 DIAGNOSIS — Z90710 Acquired absence of both cervix and uterus: Secondary | ICD-10-CM | POA: Diagnosis not present

## 2024-01-28 DIAGNOSIS — Z9079 Acquired absence of other genital organ(s): Secondary | ICD-10-CM | POA: Diagnosis not present

## 2024-01-28 NOTE — Progress Notes (Signed)
 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 Findings highly suspicious for peritoneal carcinomatosis. Recommend paracentesis for therapeutic and diagnostic purposes. I do not see an obvious primary lesion but there is some irregular enhancing soft tissue in the right adnexal area. CA 125 level may be helpful. 2. Mild surface irregularity involving the liver but I do not see any obvious changes of cirrhosis. No hepatic lesions.   11/23/2018 Tumor Marker   Patient's tumor was tested for the following markers: CA-125 Results of the tumor marker test revealed 253.   11/25/2018 Initial Diagnosis   Peritoneal carcinoma (HCC)   11/25/2018 Imaging   US  paracentesis Successful ultrasound-guided paracentesis yielding 3.4 L of peritoneal fluid.   12/01/2018 Cancer Staging   Staging form: Ovary, Fallopian Tube, and Primary Peritoneal Carcinoma, AJCC 8th Edition - Clinical stage from 12/01/2018: Stage IVA (rcT3, cN0, pM1a) - Signed by Lonn Hicks, MD on 02/14/2021 Stage prefix: Recurrence   12/02/2018 Tumor Marker   Patient's tumor was tested for the following markers: CA-125 Results of the tumor marker test revealed 267   12/06/2018 - 05/16/2019 Chemotherapy   The patient had carboplatin  and taxol  for chemotherapy treatment.     12/08/2018 Procedure   Successful ultrasound-guided therapeutic paracentesis yielding 1.7 liters of peritoneal fluid.   12/24/2018 Procedure   Successful placement of a right IJ approach Power Port with ultrasound and fluoroscopic guidance. The catheter is ready for use   01/17/2019 Tumor Marker   Patient's tumor was tested for the following markers: CA-125 Results of the tumor marker test  revealed 57.2   01/31/2019 Imaging   Significant decrease in peritoneal carcinomatosis since previous study. Interval resolution of ascites.   Focal area of parenchymal consolidation in central right middle lobe, which measures 3 cm. Differential diagnosis includes infectious or inflammatory process, atelectasis, and neoplasm. Recommend short-term follow-up by chest CT in 2-3 months.     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   Surgeon: Eloy Maurilio Fitch    Operative Findings:  : grossly normal uterus, ovaries normal, few scattered peritoneal nodules (1mm) on serosa of uterus and tubes. The omentum (gastrocolic) was tethered to the mesentery with tumor (thin rind). Complete (optimal) resection of tumor with no gross residual disease.     02/17/2019 Pathology Results   FINAL MICROSCOPIC DIAGNOSIS: A. UTERUS, CERVIX, BILATERAL FALLOPIAN TUBES AND OVARIES, HYSTERECTOMY WITH SALPINGOOOPHORECTOMY: - Uterus: Endometrium: Inactive endometrium. No hyperplasia or malignancy. Myometrium: Unremarkable. No malignancy. Serosa: Metastatic carcinoma. No malignancy. - Cervix: Benign squamous and endocervical mucosa. No dysplasia or malignancy. - Left ovary and fallopian tube: Metastatic carcinoma. - Right ovary: No malignancy identified. - Right fallopian tube: Luminal tumor, see comment. B. OMENTUM, RESECTION: - High grade serous carcinoma. - Deposits up to at least 3 cm. - See oncology table. ONCOLOGY TABLE: OVARY or FALLOPIAN TUBE or PRIMARY PERITONEUM: Procedure: Hysterectomy with bilateral salpingo-oophorectomy and omentectomy. Specimen Integrity: Intact Tumor Site: Peritoneum Ovarian Surface Involvement (required only if applicable): Left  ovary Fallopian Tube Surface Involvement (required only if applicable): Left Fallopian tube Tumor Size:  Largest deposit 3 cm Histologic Type: High-grade serous carcinoma Histologic Grade: High-grade Implants (required for advanced stage serous/seromucinous borderline tumors only): Uterine serosa, left fallopian tube and ovary, omentum Other Tissue/ Organ Involvement: As above Largest Extrapelvic Peritoneal Focus (required only if applicable): 3 cm Peritoneal/Ascitic Fluid: Pre neoadjuvant [id number] positive for carcinoma. Treatment Effect (required only for high-grade serous carcinomas): Probably partial treatment effect in omental tissue. Regional Lymph Nodes: No lymph nodes submitted or found Pathologic Stage Classification (pTNM, AJCC 8th Edition): ypT3c, ypNX Representative Tumor Block: B5 Comment(s): There is surface involvement of the left ovary and fallopian tube but no primary tumor. Within the right fallopian tube lumen there is detached fragments of tumor but again no precursor lesion is noted in the right fallopian tube. Thus, the tumor is presumed primary peritoneal and staged as such.   03/21/2019 Tumor Marker   Patient's tumor was tested for the following markers: CA-125 Results of the tumor marker test revealed 15.1    Genetic Testing   Negative genetic testing. No pathogenic variants identified on the [facility] CancerNext + RNAinsight panel. VUS in ATM called [id number] identified. The report date is 05/12/2019. TumorNext HRD was originally ordered but there was not enough sample to complete this testing.   The CancerNext+RNAinsight gene panel offered by [employer] includes sequencing and rearrangement analysis for the following 36 genes: APC*, ATM*, AXIN2, BARD1, BMPR1A, BRCA1*, BRCA2*, BRIP1*, CDH1*, CDK4, CDKN2A, CHEK2*, DICER1, MLH1*, MSH2*, MSH3, MSH6*, MUTYH*, NBN, NF1*, NTHL1, PALB2*, PMS2*, PTEN*, RAD51C*, RAD51D*, RECQL, SMAD4, SMARCA4, STK11 and TP53* (sequencing and  deletion/duplication); HOXB13, POLD1 and POLE (sequencing only); EPCAM and GREM1 (deletion/duplication only). DNA and RNA analyses performed for * genes.    05/16/2019 Tumor Marker   Patient's tumor was tested for the following markers: CA-125 Results of the tumor marker test revealed 10.7   06/16/2019 Imaging   1. No evidence of residual or recurrent metastatic disease in the chest, abdomen or pelvis. 2. Masslike focus of consolidation in the right middle lobe along the major and minor fissures with associated volume loss is stable since 01/31/2019. Indolent primary pulmonary neoplasm not excluded. PET-CT may be considered for further characterization. 3. Aortic Atherosclerosis (ICD10-I70.0).   06/16/2019 Tumor Marker   Patient's tumor was tested for the following markers: CA-125 Results of the tumor marker test revealed 9.1.   12/05/2019 Tumor Marker   Patient's tumor was tested for the following markers: CA-125. Results of the tumor marker test revealed 9.0.   02/29/2020 Tumor Marker   Patient's tumor was tested for the following markers: CA-`125 Results of the tumor marker test revealed 9.7.   05/28/2020 Tumor Marker   Patient's tumor was tested for the following markers: CA-125 Results of the tumor marker test revealed 9.5   08/16/2020 Tumor Marker   Patient's tumor was tested for the following markers: CA-125 Results of the tumor marker test revealed 10.9   11/29/2020 Tumor Marker   Patient's tumor was tested for the following markers: CA-125. Results of the tumor marker test revealed 10.2.   01/22/2021 Pathology Results   SURGICAL PATHOLOGY  CASE: [id number]  PATIENT: CLARITA REEF  Surgical Pathology Report   Clinical History: brain metastasis (cm)    FINAL MICROSCOPIC DIAGNOSIS:   A. 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     03/20/2021 Imaging   1. Unchanged enlarged midline superior mediastinal and left retroperitoneal  lymph nodes, which remain suspicious for metastatic disease. 2. Unchanged masslike consolidation and volume loss of the right middle lobe measuring 3.8 x 1.9 cm, likely chronic scarring. Continued attention on follow-up. 3. No new evidence of metastatic disease in the chest, abdomen, or pelvis. 4. Status post hysterectomy and omentectomy.   Aortic Atherosclerosis (ICD10-I70.0).     03/21/2021 - 05/07/2021 Chemotherapy   Patient is on Treatment Plan : OVARIAN RECURRENT 3RD LINE Carboplatin  D1 / Gemcitabine  D1,8 (4/800) q21d     05/17/2021 Imaging   1. No new suspicious mass or lymphadenopathy identified in the chest, abdomen or pelvis. 2. Interval decreased size of previous enlarged superior mediastinal and left retroperitoneal lymph nodes. 3. Other ancillary findings as described.   05/21/2021 - 08/13/2021 Chemotherapy   Patient is on Treatment Plan : OVARIAN Gemcitabine  D1,8 q21d      08/16/2021 Imaging   MRI brain  1. Regression of disease. Resolved marginal enhancement at the right middle frontal gyrus resection cavity and regressed enhancement at the treated left parietal lesion.   2. No new metastatic disease or acute intracranial abnormality.   08/19/2021 Imaging   Slight decrease in size of small left retroperitoneal and superior mediastinal lymph nodes.   No new or progressive disease within the chest, abdomen, or pelvis.   Aortic Atherosclerosis (ICD10-I70.0).     11/29/2021 Imaging   1. Slightly decreased size of the small left retroperitoneal lymph node with stable superior mediastinal lymph node. 2. No new or progressive disease within the chest, abdomen or pelvis. 3.  Aortic Atherosclerosis (ICD10-I70.0).     03/27/2022 Imaging   1. No current findings of active malignancy. 2. Stable chronic atelectasis of the right middle lobe. 3. Mild aortoiliac atherosclerotic vascular  calcification. 4. Degenerative subcortical cystic lesion along the left acetabulum. 5. Old healed left posterior sixth and seventh rib fractures. 6. Aortic atherosclerosis.   07/22/2022 Imaging   CT CHEST ABDOMEN PELVIS W CONTRAST  Result Date: 07/21/2022 CLINICAL DATA:  Restaging ovarian cancer.  * Tracking Code: BO *. EXAM: CT CHEST, ABDOMEN, AND PELVIS WITH CONTRAST TECHNIQUE: Multidetector CT imaging of the chest, abdomen and pelvis was performed following the standard protocol during bolus administration of intravenous contrast. RADIATION DOSE REDUCTION: This exam was performed according to the departmental dose-optimization program which includes automated exposure control, adjustment of the mA and/or kV according to patient size and/or use of iterative reconstruction technique. CONTRAST:  80mL OMNIPAQUE  IOHEXOL  300 MG/ML  SOLN COMPARISON:  03/25/2022 FINDINGS: CT CHEST FINDINGS Cardiovascular: Mild cardiac enlargement. No pericardial effusion. Aortic atherosclerotic calcifications. Mediastinum/Nodes: Thyroid  gland, trachea, and esophagus are unremarkable. No enlarged mediastinal or hilar lymph nodes. No axillary adenopathy. Lungs/Pleura: No pleural fluid. Stable appearance of atelectasis involving the right middle lobe. No suspicious pulmonary nodules identified. Musculoskeletal: T3 vertebral hemangioma. No aggressive or acute osseous findings. Remote left posterior rib fractures. CT ABDOMEN PELVIS FINDINGS Hepatobiliary: No focal liver abnormality is seen. No gallstones, gallbladder wall thickening, or biliary dilatation. Pancreas: Unremarkable. No pancreatic ductal dilatation or surrounding inflammatory changes. Spleen: Normal in size without focal abnormality. Adrenals/Urinary Tract: Normal adrenal glands. No kidney mass or hydronephrosis identified. Urinary bladder is unremarkable. Stomach/Bowel: Stomach appears normal. No pathologic dilatation of the large or small bowel loops. No bowel wall  thickening or inflammation. Vascular/Lymphatic: Aortic atherosclerosis. No aneurysm. New soft tissue nodule or lymph node along the gastrohepatic ligament measures 0.7 cm, image 61/2. Adjacent nodule is also new measuring 0.8 cm, image 60/2. The previously referenced right gastric lymph  node measuring 8 mm is unchanged, image 66/3. No enlarged pelvic or inguinal lymph nodes. Reproductive: Status post hysterectomy. No adnexal masses. Other: Status post prior omentectomy. No ascites. No discrete fluid collections identified. Musculoskeletal: No acute or significant osseous findings. IMPRESSION: 1. New soft tissue nodules or lymph nodes along the gastrohepatic ligament are identified measuring up to 0.8 cm. Recurrent peritoneal disease or nodal disease cannot be excluded. 2. Stable appearance of previously referenced right gastric lymph node measuring 8 mm. 3. No signs of metastatic disease to the chest. 4.  Aortic Atherosclerosis (ICD10-I70.0). Electronically Signed   By: Waddell Calk M.D.   On: 07/21/2022 09:47      11/09/2022 Tumor Marker   Patient's tumor was tested for the following markers: CA-125. Results of the tumor marker test revealed 8.2.   11/11/2022 Imaging   CT CHEST ABDOMEN PELVIS W CONTRAST  Result Date: 11/10/2022 CLINICAL DATA:  Ovarian cancer restaging.  * Tracking Code: BO *. EXAM: CT CHEST, ABDOMEN, AND PELVIS WITH CONTRAST TECHNIQUE: Multidetector CT imaging of the chest, abdomen and pelvis was performed following the standard protocol during bolus administration of intravenous contrast. RADIATION DOSE REDUCTION: This exam was performed according to the departmental dose-optimization program which includes automated exposure control, adjustment of the mA and/or kV according to patient size and/or use of iterative reconstruction technique. CONTRAST:  80mL OMNIPAQUE  IOHEXOL  300 MG/ML  SOLN COMPARISON:  None Available. FINDINGS: CT CHEST FINDINGS Cardiovascular: The heart size is normal.  No substantial pericardial effusion. Right Port-A-Cath tip is positioned at the SVC/RA junction. Mediastinum/Nodes: No mediastinal lymphadenopathy. There is no hilar lymphadenopathy. The esophagus has normal imaging features. There is no axillary lymphadenopathy. Lungs/Pleura: Biapical pleuroparenchymal scarring evident. Right middle lobe collapse/consolidation is stable. No new suspicious pulmonary nodule or mass. Musculoskeletal: No worrisome lytic or sclerotic osseous abnormality. CT ABDOMEN PELVIS FINDINGS Hepatobiliary: No suspicious focal abnormality within the liver parenchyma. There is no evidence for gallstones, gallbladder wall thickening, or pericholecystic fluid. No intrahepatic or extrahepatic biliary dilation. Pancreas: No focal mass lesion. No dilatation of the main duct. No intraparenchymal cyst. No peripancreatic edema. Spleen: No splenomegaly. No suspicious focal mass lesion. Adrenals/Urinary Tract: No adrenal nodule or mass. Kidneys unremarkable. No evidence for hydroureter. The urinary bladder appears normal for the degree of distention. Stomach/Bowel: Stomach is unremarkable. No gastric wall thickening. No evidence of outlet obstruction. Duodenum is normally positioned as is the ligament of Treitz. No small bowel wall thickening. No small bowel dilatation. The terminal ileum is normal. The appendix is normal. No gross colonic mass. No colonic wall thickening. Vascular/Lymphatic: There is mild atherosclerotic calcification of the abdominal aorta without aneurysm. 8 mm short axis lymph nodes in the gastrohepatic ligament (images 59 in 60 of series 2 are stable in the interval. Small left para-aortic lymph nodes are stable. No retroperitoneal or mesenteric lymphadenopathy. No pelvic sidewall lymphadenopathy. Reproductive: Hysterectomy.  There is no adnexal mass. Other: No intraperitoneal free fluid. Musculoskeletal: No worrisome lytic or sclerotic osseous abnormality. IMPRESSION: 1. Stable exam. No  new or progressive findings to suggest recurrent or metastatic disease in the chest, abdomen, or pelvis. 2. Small lymph nodes in the gastrohepatic ligament identified previously are stable in the interval. Continued attention on follow-up recommended. 3. Stable right middle lobe collapse/consolidation. 4.  Aortic Atherosclerosis (ICD10-I70.0). Electronically Signed   By: Camellia Candle M.D.   On: 11/10/2022 14:58      03/24/2023 Imaging   CT CHEST ABDOMEN PELVIS W CONTRAST Result Date: 03/27/2023 CLINICAL DATA:  Staging ovarian cancer.  * Tracking Code: BO *. EXAM: CT CHEST, ABDOMEN, AND PELVIS WITH CONTRAST TECHNIQUE: Multidetector CT imaging of the chest, abdomen and pelvis was performed following the standard protocol during bolus administration of intravenous contrast. RADIATION DOSE REDUCTION: This exam was performed according to the departmental dose-optimization program which includes automated exposure control, adjustment of the mA and/or kV according to patient size and/or use of iterative reconstruction technique. CONTRAST:  OMNIPAQUE  IOHEXOL  300 MG/ML  SOLN COMPARISON:  11/06/2022 and older. FINDINGS: CT CHEST FINDINGS Cardiovascular: Stable mild cardiac enlargement. Trace pericardial fluid. The thoracic aorta has a normal course and caliber. Slight intimal thickening and plaque. Right upper chest port is accessed with the tip extending to the upper right atrium. Prominent pulmonary arteries. Mediastinum/Nodes: Preserved thyroid  gland. Slightly patulous thoracic esophagus. No specific abnormal lymph node enlargement seen in the axillary region, hilum mediastinum. Small right anterior cardiophrenic angle node on series 2, image 50 is similar to previous measuring 5 mm in short axis. Lungs/Pleura: There is some linear opacity seen along bases likely scar or atelectasis. No pneumothorax or effusion. Stable juxtapleural left lower lobe 3 mm lung nodule on series 6, image 115. There is bandlike  atelectasis seen along the posterosuperior aspect of the middle lobe. Also unchanged from previous. Minimal dependent atelectasis as well elsewhere. Musculoskeletal: No chest wall mass or suspicious bone lesions identified. CT ABDOMEN PELVIS FINDINGS Hepatobiliary: No focal liver abnormality is seen. Fatty liver infiltration diffusely. No gallstones, gallbladder wall thickening, or biliary dilatation. Pancreas: Unremarkable. No pancreatic ductal dilatation or surrounding inflammatory changes. Spleen: Normal in size without focal abnormality. Adrenals/Urinary Tract: Adrenal glands are unremarkable. Kidneys are normal, without renal calculi, focal lesion, or hydronephrosis. Bladder is unremarkable. Stomach/Bowel: Stomach is within normal limits. Appendix appears normal. No evidence of bowel wall thickening, distention, or inflammatory changes. Vascular/Lymphatic: Aortic atherosclerosis. Circumaortic renal vein. Abnormal nodes again seen in the upper abdomen.One is slightly larger now measuring 12 mm on image 62 of series 2. Previously 8 mm. Second small node just medial and superior to the first is similar at 8 mm on image 60. No new lymph node enlargement elsewhere. Few nonpathologic retroperitoneal nodes more caudal near the aortic bifurcation. Reproductive: Status post hysterectomy. No adnexal masses. Other: No abdominal wall hernia or abnormality. No abdominopelvic ascites. Musculoskeletal: No acute or significant osseous findings. IMPRESSION: Upper abdominal abnormal nodes are again seen. One is slightly larger today than previous. No new nodal enlargement. No developing ascites. Fatty liver infiltration. Stable segmental collapse of the middle lobe. Electronically Signed   By: Ranell Bring M.D.   On: 03/27/2023 13:53      03/26/2023 Tumor Marker   Patient's tumor was tested for the following markers: CA-125. Results of the tumor marker test revealed 8.9.   06/29/2023 Imaging   *No acute findings in the  abdomen or pelvis. *Moderate amount of residual fecal material throughout the colon without obstruction or constipation. *Status post hysterectomy. No adnexal masses.     01/15/2024 Imaging   CT RENAL STONE STUDY Result Date: 01/15/2024 CLINICAL DATA:  Abdominal/flank pain, stone suspected. History of ovarian cancer. EXAM: CT ABDOMEN AND PELVIS WITHOUT CONTRAST TECHNIQUE: Multidetector CT imaging of the abdomen and pelvis was performed following the standard protocol without IV contrast. RADIATION DOSE REDUCTION: This exam was performed according to the departmental dose-optimization program which includes automated exposure control, adjustment of the mA and/or kV according to patient size and/or use of iterative reconstruction technique. COMPARISON:  06/29/2023 FINDINGS:  Of note, the lack of intravenous contrast limits evaluation of the solid organ parenchyma and vascularity. Lower chest: No focal airspace consolidation or pleural effusion. Hepatobiliary: No mass.No radiopaque stones or wall thickening of the gallbladder. No intrahepatic or extrahepatic biliary ductal dilation. Pancreas: No mass or main ductal dilation. No peripancreatic inflammation or fluid collection. Spleen: New, hypoattenuating mass within the splenic hilum measuring 1.7 x 1.6 cm (axial 12). Adrenals/Urinary Tract: No adrenal masses. No renal mass. Mild left-sided hydroureteronephrosis. The urinary bladder is completely decompressed. Stomach/Bowel: The stomach is decompressed without focal abnormality. No small bowel wall thickening or inflammation. No small bowel obstruction.Normal appendix. Vascular/Lymphatic: No aortic aneurysm. Scattered aortoiliac atherosclerosis. No intraabdominal or pelvic lymphadenopathy. Reproductive: Hysterectomy. No concerning adnexal mass. No free pelvic fluid. Other: No pneumoperitoneum, ascites, or mesenteric inflammation. Musculoskeletal: No acute fracture or destructive lesion. IMPRESSION: 1. Mild  left-sided hydroureteronephrosis with abrupt narrowing in the distal left ureter along the left pelvic sidewall (axial 54). No obstructive ureteral calculi. This could represent changes from an ascending urinary tract infection or recently passed ureteral calculus. Nonemergent CT urogram should be considered to evaluate for either intraluminal lesion or extraluminal compression. 2. In the interim, a new hypoattenuating lesion has developed in the splenic hilum measuring 1.7 x 1.6 cm (axial 12). Given the patient's history of ovarian cancer, this is worrisome for new metastatic disease. Aortic Atherosclerosis (ICD10-I70.0). Electronically Signed   By: Rogelia Myers M.D.   On: 01/15/2024 13:59        Interval History: Reports decreased appetite, abdominal bloating and feeling full.  Continues to endorse just not feeling well, which is her baseline since treatment.  Endorses good bowel function.  Was using MiraLAX after recent CT scan per urologist recommendation.  Denies any bladder symptoms.  Continues to have pain on her vulva that requires her to wear her pants lower.  Has been using Aquaphor and being careful with hypoallergenic products, which has moved her symptoms some.  Denies any bleeding or discharge.  Past Medical/Surgical History: Past Medical History:  Diagnosis Date   Allergic rhinitis    Aortic atherosclerosis    Aortic insufficiency    mild by echo 04/2023   Cervical dysplasia 1980   Cervicalgia    Eczema    Rosacea,dermatitis-Dr Stinehelfer   Esophageal reflux    Family history of colon cancer    Family history of melanoma    Family history of ovarian cancer    Family history of pancreatic cancer    Family history of prostate cancer    Family history of uterine cancer    Fibromyalgia    GERD (gastroesophageal reflux disease)    Glaucoma, narrow-angle    Heart murmur    Hiatal hernia    Irritable bowel syndrome    LBBB (left bundle branch block)    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), unspecified site    DCIS   Mass of lung    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   Mitral valve disorders(424.0)    No MVP and trivial MR by echo 10/2021   Osteoporosis 12/2016   T score -2.3, 2014 T score -2.5 AP spine   PAC (premature atrial contraction) 2012   ,PVC's, and nonsustained atril tachycardia w aberration by heart monitor    Pulmonic valve insufficiency    mild by echo 8/23   PVC (premature ventricular contraction)    Rosacea    Stricture and stenosis of esophagus    Tricuspid insufficiency  mild by echo 10/2021    Past Surgical History:  Procedure Laterality Date   APPLICATION OF CRANIAL NAVIGATION N/A 01/22/2021   Procedure: APPLICATION OF CRANIAL NAVIGATION;  Surgeon: Carollee Lani BROCKS, DO;  Location: [facility] OR;  Service: Neurosurgery;  Laterality: N/A;   BREAST LUMPECTOMY  1998   right breast with radiation   CERVICAL DISCECTOMY     C3-7 with bone graft, in three different surgeries, have titanium plate and screws   CRANIOTOMY N/A 01/22/2021   Procedure: STEREOTACTIC RIGHT FRONTAL CRANIOTOMY FOR RESECTION OF TUMOR;  Surgeon: Carollee Lani BROCKS, DO;  Location: [facility] OR;  Service: Neurosurgery;  Laterality: N/A;   GYNECOLOGIC CRYOSURGERY  1980   IR IMAGING GUIDED PORT INSERTION  12/24/2018   NECK SURGERY  1993-1997   bone graft and fusion   OMENTECTOMY  02/17/2019   REFRACTIVE SURGERY     narrow angle glucoma   RHINOPLASTY  1976   sterotactic large core needle biopsy right breast Right 10/15/1996   TUBAL LIGATION  1977    Family History  Problem Relation Age of Onset   Heart disease Mother    Depression Mother    Hypertension Mother    Heart disease Father    Hypertension Father    Aneurysm Father    Uterine cancer Sister 71   Hypertension Sister    Ovarian cancer Sister 78   Melanoma Sister 73   Depression Sister    Melanoma Sister 65   Heart disease Paternal Grandfather    Stroke Maternal Grandmother    Cancer Maternal Grandfather 89        Pancreatic   Stroke Paternal Grandmother    Cancer Paternal Uncle        multiple myeloma, prostate cancer and melanoma (separate primaries)   Colon cancer Neg Hx     Social History   Socioeconomic History   Marital status: Married    Spouse name: Dale   Number of children: 0   Years of education: Not on file   Highest education level: Not on file  Occupational History   Occupation: Educational Psychologist: [employer]  Tobacco Use   Smoking status: Never   Smokeless tobacco: Never  Vaping Use   Vaping status: Never Used  Substance and Sexual Activity   Alcohol use: Yes    Alcohol/week: 2.0 standard drinks of alcohol    Types: 2 Standard drinks or equivalent per week    Comment: Social    Drug use: No   Sexual activity: Not Currently    Birth control/protection: Post-menopausal    Comment: 1st intercourse 73 yo- More than 5 partners  Other Topics Concern   Not on file  Social History Narrative   Not on file   Social Drivers of Health   Financial Resource Strain: Not on file  Food Insecurity: Not on file  Transportation Needs: Not on file  Physical Activity: Not on file  Stress: Not on file  Social Connections: Unknown (07/23/2021)   Received from [facility]   Social Network    Social Network: Not on file    Current Medications:  Current Outpatient Medications:    Azelaic Acid  15 % cream, Apply 1 application topically daily., Disp: , Rfl:    cromolyn  (NASALCROM ) 5.2 MG/ACT nasal spray, Place 1 spray into both nostrils., Disp: , Rfl:    cyanocobalamin (VITAMIN B12) 500 MCG tablet, Take 500 mcg by mouth daily., Disp: , Rfl:    gabapentin  (NEURONTIN ) 300 MG capsule,  1 capsule., Disp: , Rfl:    levETIRAcetam  (KEPPRA ) 500 MG tablet, TAKE 2 TABLETS(1000 MG) BY MOUTH TWICE DAILY, Disp: 360 tablet, Rfl: 1   levETIRAcetam  (KEPPRA ) 500 MG tablet, 2 tablets Orally every 12 hrs; Duration: 30 day(s) (Patient taking differently: 500 mg 2 (two) times  daily.), Disp: , Rfl:    lidocaine -prilocaine  (EMLA ) cream, Apply to affected area(s) as needed., Disp: 30 g, Rfl: 3   LORazepam  (ATIVAN ) 2 MG tablet, Take 1 tablet (2 mg total) by mouth every 6 (six) hours as needed for seizure., Disp: 10 tablet, Rfl: 0  Review of Systems: Denies fevers, chills, fatigue, unexplained weight changes. Denies hearing loss, neck lumps or masses, mouth sores, ringing in ears or voice changes. Denies cough or wheezing.  Denies shortness of breath. Denies chest pain or palpitations. Denies leg swelling. Denies blood in stools, constipation, diarrhea, nausea, vomiting, or early satiety. Denies pain with intercourse, dysuria, frequency, hematuria or incontinence. Denies hot flashes, pelvic pain, vaginal bleeding or vaginal discharge.   Denies joint pain, back pain or muscle pain/cramps. Denies itching, rash, or wounds. Denies dizziness, headaches, numbness or seizures. Denies swollen lymph nodes or glands, denies easy bruising or bleeding. Denies anxiety, depression, confusion, or decreased concentration.  Physical Exam: BP (!) 134/58 (BP Location: Right Arm, Patient Position: Sitting)   Pulse 78   Temp 98.4 F (36.9 C) (Oral)   Resp 18   Wt 92 lb 3.2 oz (41.8 kg)   SpO2 100%   BMI 15.34 kg/m  General: Alert, oriented, no acute distress. HEENT: Normocephalic, atraumatic, sclera anicteric. Chest: Clear to auscultation bilaterally.  No wheezes or rhonchi. Cardiovascular: Regular rate and rhythm, no murmurs. Breast: No masses, lumps, or skin changes.  No adenopathy. Abdomen: soft, nontender.  Normoactive bowel sounds.  No masses or hepatosplenomegaly appreciated.  Well-healed incisions. Extremities: Grossly normal range of motion.  Warm, well perfused.  No edema bilaterally. Skin: No rashes or lesions noted. Lymphatics: No cervical, supraclavicular, or inguinal adenopathy. GU: Normal appearing external genitalia without erythema, excoriation, or lesions.   Some loss of architecture of the vulva posteriorly.  No areas of obvious skin thinning. Speculum exam reveals moderately atrophic vaginal mucosa, cuff intact, no lesions.  Bimanual exam reveals cuff intact, no masses or nodularity.  Rectovaginal exam confirms findings.  Laboratory & Radiologic Studies:          Component Ref Range & Units (hover) 7 mo ago (06/29/23) 10 mo ago (03/24/23) 1 yr ago (11/06/22) 1 yr ago (03/25/22) 3 yr ago (12/23/20) 3 yr ago (11/29/20) 3 yr ago (08/16/20)  Cancer Antigen (CA) 125 8.1 8.9 CM 8.2 CM 10.7 CM 10.0 CM 10.2 CM 10.9 CM     MRI brain 11/19/23: 1. Mild interval worsening of curvilinear nodular enhancement along the medial and posterior margins of the patient's right posterior frontal resection cavity, concerning for residual or recurrent metastatic neoplasm. 2. Stable focal area of nodular enhancement in the subcortical white matter of the left parietal lobe, compatible with previously treated metastatic lesion.  Assessment & Plan: [name] [name] is a 73 y.o. 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 Therapy completed April, 2021. Complete clinical response.  Somatic and germline testing negative. Recurrence in 12/2020 with brain metastases, mediastinal and retroperitoneal adenopathy. S/p stereotactic right frontal craniotomy in 01/2021, palliative RT, and carbo/gemzar  completed in 07/2021. Recent imaging in late October 2025 for abdominal/flank pain showed  changes suggestive of ascending urinary tract infection or recently passed ureteral calculus.  New hypoattenuating lesion has developed in the splenic hilum measuring up to 1.7 cm that is worrisome for new metastatic disease.   Overall doing well and is NED on exam today.  She follows with Dr. Lonn as well as Dr. [name] given side effects secondary to cancer recurrence and brain  surgery.  Reviewed findings on most recent CT scan.  Discussed difficulty of comparing noncontrast to contrast scans.  I agree with Dr. Lonn that there appears to be a lesion in a similar location in the spleen seen on prior imaging earlier this year.  It is difficult to compare contrast and noncontrasted studies.  Plan is for repeat imaging in January.  We also discussed her most recent brain MRI which had mild worsening of a nodular enhancement along prior resection cavity that is concerning for residual or recurrent disease.  She follows closely with Dr. [name] for this.  She desires annual speculum examination due to her sister's history of vulvar melanoma.  We discussed continuing with regular follow-up every 6 months which will include a pelvic exam -her preference today is to return in 1 year.  She follows regularly with a dermatologist.   CA-125 was not a marker for her recent recurrence but was a marker for her initial diagnosis.  This was ordered today.   She sees Dr. Lonn in January with plan for repeat imaging.  I stressed the importance of calling if she develops any new and concerning symptoms before her next visit.  Discussed vulvar symptoms.  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  Patient wanted to think about both of these options and will let me know if she would like to proceed with either.  32 minutes of total time was spent for this patient encounter, including preparation, face-to-face c38ounseling with the patient and coordination of care, and documentation of the encounter.  Comer Dollar, MD  Division of Gynecologic Oncology  Department of Obstetrics and Gynecology  [facility]

## 2024-01-28 NOTE — Patient Instructions (Signed)
 It was good to see you today.    I do not see any concerning spots on your vulva or in your vagina.  We will see you for follow-up in 12 months.  As always, if you develop any new and concerning symptoms before your next visit, please call to see me sooner.  We discussed possible trial of vaginal estrogen. The two names of this product are vaginal estrace and vaginal premarin.  If you decide you would like me to send a referral to a clinic that specializes in vulvar pain, please let me know.

## 2024-01-29 ENCOUNTER — Ambulatory Visit: Payer: Self-pay | Admitting: Gynecologic Oncology

## 2024-01-29 ENCOUNTER — Ambulatory Visit: Admitting: Gynecologic Oncology

## 2024-01-29 LAB — CA 125: Cancer Antigen (CA) 125: 25.7 U/mL (ref 0.0–38.1)

## 2024-02-03 ENCOUNTER — Ambulatory Visit
Admission: RE | Admit: 2024-02-03 | Discharge: 2024-02-03 | Disposition: A | Source: Ambulatory Visit | Attending: Internal Medicine

## 2024-02-03 DIAGNOSIS — C719 Malignant neoplasm of brain, unspecified: Secondary | ICD-10-CM | POA: Diagnosis not present

## 2024-02-03 DIAGNOSIS — C7931 Secondary malignant neoplasm of brain: Secondary | ICD-10-CM

## 2024-02-03 MED ORDER — GADOPICLENOL 0.5 MMOL/ML IV SOLN
4.0000 mL | Freq: Once | INTRAVENOUS | Status: AC | PRN
  Administered 2024-02-03: 4 mL via INTRAVENOUS

## 2024-02-05 DIAGNOSIS — N131 Hydronephrosis with ureteral stricture, not elsewhere classified: Secondary | ICD-10-CM | POA: Diagnosis not present

## 2024-02-08 ENCOUNTER — Inpatient Hospital Stay

## 2024-02-08 ENCOUNTER — Inpatient Hospital Stay: Admitting: Internal Medicine

## 2024-02-08 ENCOUNTER — Encounter

## 2024-02-08 VITALS — BP 138/64 | HR 82 | Temp 97.9°F | Resp 17 | Wt 91.1 lb

## 2024-02-08 DIAGNOSIS — Z8544 Personal history of malignant neoplasm of other female genital organs: Secondary | ICD-10-CM | POA: Diagnosis not present

## 2024-02-08 DIAGNOSIS — Z9079 Acquired absence of other genital organ(s): Secondary | ICD-10-CM | POA: Diagnosis not present

## 2024-02-08 DIAGNOSIS — Z90710 Acquired absence of both cervix and uterus: Secondary | ICD-10-CM | POA: Diagnosis not present

## 2024-02-08 DIAGNOSIS — C7931 Secondary malignant neoplasm of brain: Secondary | ICD-10-CM | POA: Diagnosis not present

## 2024-02-08 DIAGNOSIS — Z9221 Personal history of antineoplastic chemotherapy: Secondary | ICD-10-CM | POA: Diagnosis not present

## 2024-02-08 DIAGNOSIS — Z923 Personal history of irradiation: Secondary | ICD-10-CM | POA: Diagnosis not present

## 2024-02-08 DIAGNOSIS — Z90722 Acquired absence of ovaries, bilateral: Secondary | ICD-10-CM | POA: Diagnosis not present

## 2024-02-08 NOTE — Progress Notes (Signed)
 [facility] [address]  [place], [name] [phone]   Interval Evaluation  Date of Service: 02/08/24 Patient Name: [name] [name] Patient MRN: [id number] Patient DOB: April 07, 1950 Provider: Arthea MARLA Manns, MD  Identifying Statement:  [name] [name] is a 73 y.o. 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 Findings highly suspicious for peritoneal carcinomatosis. Recommend paracentesis for therapeutic and diagnostic purposes. I do not see an obvious primary lesion but there is some irregular enhancing soft tissue in the right adnexal area. CA 125 level may be helpful. 2. Mild surface irregularity involving the liver but I do not see any obvious changes of cirrhosis. No hepatic lesions.   11/23/2018 Tumor Marker   Patient's tumor was tested for the following markers: CA-125 Results of the tumor marker test revealed 253.   11/25/2018 Initial Diagnosis   Peritoneal carcinoma (HCC)   11/25/2018 Imaging   US  paracentesis Successful ultrasound-guided paracentesis yielding 3.4 L of peritoneal fluid.   12/01/2018 Cancer Staging   Staging form: Ovary, Fallopian Tube, and Primary Peritoneal Carcinoma, AJCC 8th Edition - Clinical stage from 12/01/2018: Stage IVA (rcT3, cN0, pM1a) - Signed by Lonn Hicks, MD on 02/14/2021 Stage prefix: Recurrence   12/02/2018 Tumor Marker   Patient's tumor was tested for the following markers: CA-125 Results of the tumor marker test revealed 267   12/06/2018 - 05/16/2019 Chemotherapy   The patient had carboplatin  and taxol  for chemotherapy treatment.     12/08/2018 Procedure   Successful ultrasound-guided therapeutic paracentesis  yielding 1.7 liters of peritoneal fluid.   12/24/2018 Procedure   Successful placement of a right IJ approach Power Port with ultrasound and fluoroscopic guidance. The catheter is ready for use   01/17/2019 Tumor Marker   Patient's tumor was tested for the following markers: CA-125 Results of the tumor marker test revealed 57.2   01/31/2019 Imaging   Significant decrease in peritoneal carcinomatosis since previous study. Interval resolution of ascites.   Focal area of parenchymal consolidation in central right middle lobe, which measures 3 cm. Differential diagnosis includes infectious or inflammatory process, atelectasis, and neoplasm. Recommend short-term follow-up by chest CT in 2-3 months.     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   Surgeon: Eloy Maurilio Fitch    Operative Findings:  : grossly normal uterus, ovaries normal, few scattered peritoneal nodules (1mm) on serosa of uterus and tubes. The omentum (gastrocolic) was tethered to the mesentery with tumor (thin rind). Complete (optimal) resection of tumor with no gross residual disease.     02/17/2019 Pathology Results   FINAL MICROSCOPIC DIAGNOSIS: A. UTERUS, CERVIX, BILATERAL FALLOPIAN TUBES AND OVARIES, HYSTERECTOMY WITH SALPINGOOOPHORECTOMY: - Uterus: Endometrium: Inactive endometrium. No hyperplasia or malignancy. Myometrium: Unremarkable. No malignancy. Serosa: Metastatic carcinoma. No malignancy. - Cervix: Benign squamous and endocervical mucosa. No dysplasia or malignancy. - Left ovary and fallopian tube: Metastatic carcinoma. - Right ovary: No malignancy identified. - Right fallopian tube: Luminal tumor, see comment. B. OMENTUM, RESECTION: - High grade serous  carcinoma. - Deposits up to  at least 3 cm. - See oncology table. ONCOLOGY TABLE: OVARY or FALLOPIAN TUBE or PRIMARY PERITONEUM: Procedure: Hysterectomy with bilateral salpingo-oophorectomy and omentectomy. Specimen Integrity: Intact Tumor Site: Peritoneum Ovarian Surface Involvement (required only if applicable): Left ovary Fallopian Tube Surface Involvement (required only if applicable): Left Fallopian tube Tumor Size: Largest deposit 3 cm Histologic Type: High-grade serous carcinoma Histologic Grade: High-grade Implants (required for advanced stage serous/seromucinous borderline tumors only): Uterine serosa, left fallopian tube and ovary, omentum Other Tissue/ Organ Involvement: As above Largest Extrapelvic Peritoneal Focus (required only if applicable): 3 cm Peritoneal/Ascitic Fluid: Pre neoadjuvant [id number] positive for carcinoma. Treatment Effect (required only for high-grade serous carcinomas): Probably partial treatment effect in omental tissue. Regional Lymph Nodes: No lymph nodes submitted or found Pathologic Stage Classification (pTNM, AJCC 8th Edition): ypT3c, ypNX Representative Tumor Block: B5 Comment(s): There is surface involvement of the left ovary and fallopian tube but no primary tumor. Within the right fallopian tube lumen there is detached fragments of tumor but again no precursor lesion is noted in the right fallopian tube. Thus, the tumor is presumed primary peritoneal and staged as such.   03/21/2019 Tumor Marker   Patient's tumor was tested for the following markers: CA-125 Results of the tumor marker test revealed 15.1    Genetic Testing   Negative genetic testing. No pathogenic variants identified on the [employer] CancerNext + RNAinsight panel. VUS in ATM called [id number] identified. The report date is 05/12/2019. TumorNext HRD was originally ordered but there was not enough sample to complete this testing.   The CancerNext+RNAinsight gene panel offered by [employer] includes sequencing and rearrangement analysis for the following 36 genes: APC*, ATM*, AXIN2, BARD1, BMPR1A, BRCA1*, BRCA2*, BRIP1*, CDH1*, CDK4, CDKN2A, CHEK2*, DICER1, MLH1*, MSH2*, MSH3, MSH6*, MUTYH*, NBN, NF1*, NTHL1, PALB2*, PMS2*, PTEN*, RAD51C*, RAD51D*, RECQL, SMAD4, SMARCA4, STK11 and TP53* (sequencing and deletion/duplication); HOXB13, POLD1 and POLE (sequencing only); EPCAM and GREM1 (deletion/duplication only). DNA and RNA analyses performed for * genes.    05/16/2019 Tumor Marker   Patient's tumor was tested for the following markers: CA-125 Results of the tumor marker test revealed 10.7   06/16/2019 Imaging   1. No evidence of residual or recurrent metastatic disease in the chest, abdomen or pelvis. 2. Masslike focus of consolidation in the right middle lobe along the major and minor fissures with associated volume loss is stable since 01/31/2019. Indolent primary pulmonary neoplasm not excluded. PET-CT may be considered for further characterization. 3. Aortic Atherosclerosis (ICD10-I70.0).   06/16/2019 Tumor Marker   Patient's tumor was tested for the following markers: CA-125 Results of the tumor marker test revealed 9.1.   12/05/2019 Tumor Marker   Patient's tumor was tested for the following markers: CA-125. Results of the tumor marker test revealed 9.0.   02/29/2020 Tumor Marker   Patient's tumor was tested for the following markers: CA-`125 Results of the tumor marker test revealed 9.7.   05/28/2020 Tumor Marker   Patient's tumor was tested for the following markers: CA-125 Results of the tumor marker test revealed 9.5   08/16/2020 Tumor Marker   Patient's tumor was tested for the following markers: CA-125 Results of the tumor marker test revealed 10.9   11/29/2020 Tumor Marker   Patient's tumor was tested for the following markers: CA-125. Results of the tumor marker test revealed 10.2.   01/22/2021 Pathology Results   SURGICAL PATHOLOGY  CASE: [id number]   PATIENT: CLARITA REEF  Surgical Pathology Report   Clinical History: brain metastasis (cm)  FINAL MICROSCOPIC DIAGNOSIS:   A. 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    03/20/2021 Imaging   1. Unchanged enlarged midline superior mediastinal and left retroperitoneal lymph nodes, which remain suspicious for metastatic disease. 2. Unchanged masslike consolidation and volume loss of the right middle lobe measuring 3.8 x 1.9 cm, likely chronic scarring. Continued attention on follow-up. 3. No new evidence of metastatic disease in the chest, abdomen, or pelvis. 4. Status post hysterectomy and omentectomy.   Aortic Atherosclerosis (ICD10-I70.0).     03/21/2021 - 05/07/2021 Chemotherapy   Patient is on Treatment Plan : OVARIAN RECURRENT 3RD LINE Carboplatin  D1 / Gemcitabine  D1,8 (4/800) q21d     05/17/2021 Imaging   1. No new suspicious mass or lymphadenopathy identified in the chest, abdomen or pelvis. 2. Interval decreased size of previous enlarged superior mediastinal and left retroperitoneal lymph nodes. 3. Other ancillary findings as described.   05/21/2021 - 08/13/2021 Chemotherapy   Patient is on Treatment Plan : OVARIAN Gemcitabine  D1,8 q21d      08/16/2021 Imaging   MRI brain  1. Regression of disease. Resolved marginal enhancement at the right middle frontal gyrus resection cavity and regressed enhancement at the treated left parietal lesion.   2. No new metastatic disease or acute intracranial abnormality.   08/19/2021 Imaging   Slight decrease in size of small left retroperitoneal and superior mediastinal lymph nodes.   No new or progressive disease within the chest, abdomen, or pelvis.   Aortic Atherosclerosis (ICD10-I70.0).     11/29/2021 Imaging   1. Slightly decreased size of the small left retroperitoneal lymph node with stable  superior mediastinal lymph node. 2. No new or progressive disease within the chest, abdomen or pelvis. 3.  Aortic Atherosclerosis (ICD10-I70.0).     03/27/2022 Imaging   1. No current findings of active malignancy. 2. Stable chronic atelectasis of the right middle lobe. 3. Mild aortoiliac atherosclerotic vascular calcification. 4. Degenerative subcortical cystic lesion along the left acetabulum. 5. Old healed left posterior sixth and seventh rib fractures. 6. Aortic atherosclerosis.   07/22/2022 Imaging   CT CHEST ABDOMEN PELVIS W CONTRAST  Result Date: 07/21/2022 CLINICAL DATA:  Restaging ovarian cancer.  * Tracking Code: BO *. EXAM: CT CHEST, ABDOMEN, AND PELVIS WITH CONTRAST TECHNIQUE: Multidetector CT imaging of the chest, abdomen and pelvis was performed following the standard protocol during bolus administration of intravenous contrast. RADIATION DOSE REDUCTION: This exam was performed according to the departmental dose-optimization program which includes automated exposure control, adjustment of the mA and/or kV according to patient size and/or use of iterative reconstruction technique. CONTRAST:  80mL OMNIPAQUE  IOHEXOL  300 MG/ML  SOLN COMPARISON:  03/25/2022 FINDINGS: CT CHEST FINDINGS Cardiovascular: Mild cardiac enlargement. No pericardial effusion. Aortic atherosclerotic calcifications. Mediastinum/Nodes: Thyroid  gland, trachea, and esophagus are unremarkable. No enlarged mediastinal or hilar lymph nodes. No axillary adenopathy. Lungs/Pleura: No pleural fluid. Stable appearance of atelectasis involving the right middle lobe. No suspicious pulmonary nodules identified. Musculoskeletal: T3 vertebral hemangioma. No aggressive or acute osseous findings. Remote left posterior rib fractures. CT ABDOMEN PELVIS FINDINGS Hepatobiliary: No focal liver abnormality is seen. No gallstones, gallbladder wall thickening, or biliary dilatation. Pancreas: Unremarkable. No pancreatic ductal dilatation or  surrounding inflammatory changes. Spleen: Normal in size without focal abnormality. Adrenals/Urinary Tract: Normal adrenal glands. No kidney mass or hydronephrosis identified. Urinary bladder is unremarkable. Stomach/Bowel: Stomach appears normal. No pathologic  dilatation of the large or small bowel loops. No bowel wall thickening or inflammation. Vascular/Lymphatic: Aortic atherosclerosis. No aneurysm. New soft tissue nodule or lymph node along the gastrohepatic ligament measures 0.7 cm, image 61/2. Adjacent nodule is also new measuring 0.8 cm, image 60/2. The previously referenced right gastric lymph node measuring 8 mm is unchanged, image 66/3. No enlarged pelvic or inguinal lymph nodes. Reproductive: Status post hysterectomy. No adnexal masses. Other: Status post prior omentectomy. No ascites. No discrete fluid collections identified. Musculoskeletal: No acute or significant osseous findings. IMPRESSION: 1. New soft tissue nodules or lymph nodes along the gastrohepatic ligament are identified measuring up to 0.8 cm. Recurrent peritoneal disease or nodal disease cannot be excluded. 2. Stable appearance of previously referenced right gastric lymph node measuring 8 mm. 3. No signs of metastatic disease to the chest. 4.  Aortic Atherosclerosis (ICD10-I70.0). Electronically Signed   By: Waddell Calk M.D.   On: 07/21/2022 09:47      11/09/2022 Tumor Marker   Patient's tumor was tested for the following markers: CA-125. Results of the tumor marker test revealed 8.2.   11/11/2022 Imaging   CT CHEST ABDOMEN PELVIS W CONTRAST  Result Date: 11/10/2022 CLINICAL DATA:  Ovarian cancer restaging.  * Tracking Code: BO *. EXAM: CT CHEST, ABDOMEN, AND PELVIS WITH CONTRAST TECHNIQUE: Multidetector CT imaging of the chest, abdomen and pelvis was performed following the standard protocol during bolus administration of intravenous contrast. RADIATION DOSE REDUCTION: This exam was performed according to the departmental  dose-optimization program which includes automated exposure control, adjustment of the mA and/or kV according to patient size and/or use of iterative reconstruction technique. CONTRAST:  80mL OMNIPAQUE  IOHEXOL  300 MG/ML  SOLN COMPARISON:  None Available. FINDINGS: CT CHEST FINDINGS Cardiovascular: The heart size is normal. No substantial pericardial effusion. Right Port-A-Cath tip is positioned at the SVC/RA junction. Mediastinum/Nodes: No mediastinal lymphadenopathy. There is no hilar lymphadenopathy. The esophagus has normal imaging features. There is no axillary lymphadenopathy. Lungs/Pleura: Biapical pleuroparenchymal scarring evident. Right middle lobe collapse/consolidation is stable. No new suspicious pulmonary nodule or mass. Musculoskeletal: No worrisome lytic or sclerotic osseous abnormality. CT ABDOMEN PELVIS FINDINGS Hepatobiliary: No suspicious focal abnormality within the liver parenchyma. There is no evidence for gallstones, gallbladder wall thickening, or pericholecystic fluid. No intrahepatic or extrahepatic biliary dilation. Pancreas: No focal mass lesion. No dilatation of the main duct. No intraparenchymal cyst. No peripancreatic edema. Spleen: No splenomegaly. No suspicious focal mass lesion. Adrenals/Urinary Tract: No adrenal nodule or mass. Kidneys unremarkable. No evidence for hydroureter. The urinary bladder appears normal for the degree of distention. Stomach/Bowel: Stomach is unremarkable. No gastric wall thickening. No evidence of outlet obstruction. Duodenum is normally positioned as is the ligament of Treitz. No small bowel wall thickening. No small bowel dilatation. The terminal ileum is normal. The appendix is normal. No gross colonic mass. No colonic wall thickening. Vascular/Lymphatic: There is mild atherosclerotic calcification of the abdominal aorta without aneurysm. 8 mm short axis lymph nodes in the gastrohepatic ligament (images 59 in 60 of series 2 are stable in the interval.  Small left para-aortic lymph nodes are stable. No retroperitoneal or mesenteric lymphadenopathy. No pelvic sidewall lymphadenopathy. Reproductive: Hysterectomy.  There is no adnexal mass. Other: No intraperitoneal free fluid. Musculoskeletal: No worrisome lytic or sclerotic osseous abnormality. IMPRESSION: 1. Stable exam. No new or progressive findings to suggest recurrent or metastatic disease in the chest, abdomen, or pelvis. 2. Small lymph nodes in the gastrohepatic ligament identified previously are stable in the interval.  Continued attention on follow-up recommended. 3. Stable right middle lobe collapse/consolidation. 4.  Aortic Atherosclerosis (ICD10-I70.0). Electronically Signed   By: Camellia Candle M.D.   On: 11/10/2022 14:58      03/24/2023 Imaging   CT CHEST ABDOMEN PELVIS W CONTRAST Result Date: 03/27/2023 CLINICAL DATA:  Staging ovarian cancer.  * Tracking Code: BO *. EXAM: CT CHEST, ABDOMEN, AND PELVIS WITH CONTRAST TECHNIQUE: Multidetector CT imaging of the chest, abdomen and pelvis was performed following the standard protocol during bolus administration of intravenous contrast. RADIATION DOSE REDUCTION: This exam was performed according to the departmental dose-optimization program which includes automated exposure control, adjustment of the mA and/or kV according to patient size and/or use of iterative reconstruction technique. CONTRAST:  OMNIPAQUE  IOHEXOL  300 MG/ML  SOLN COMPARISON:  11/06/2022 and older. FINDINGS: CT CHEST FINDINGS Cardiovascular: Stable mild cardiac enlargement. Trace pericardial fluid. The thoracic aorta has a normal course and caliber. Slight intimal thickening and plaque. Right upper chest port is accessed with the tip extending to the upper right atrium. Prominent pulmonary arteries. Mediastinum/Nodes: Preserved thyroid  gland. Slightly patulous thoracic esophagus. No specific abnormal lymph node enlargement seen in the axillary region, hilum mediastinum. Small right  anterior cardiophrenic angle node on series 2, image 50 is similar to previous measuring 5 mm in short axis. Lungs/Pleura: There is some linear opacity seen along bases likely scar or atelectasis. No pneumothorax or effusion. Stable juxtapleural left lower lobe 3 mm lung nodule on series 6, image 115. There is bandlike atelectasis seen along the posterosuperior aspect of the middle lobe. Also unchanged from previous. Minimal dependent atelectasis as well elsewhere. Musculoskeletal: No chest wall mass or suspicious bone lesions identified. CT ABDOMEN PELVIS FINDINGS Hepatobiliary: No focal liver abnormality is seen. Fatty liver infiltration diffusely. No gallstones, gallbladder wall thickening, or biliary dilatation. Pancreas: Unremarkable. No pancreatic ductal dilatation or surrounding inflammatory changes. Spleen: Normal in size without focal abnormality. Adrenals/Urinary Tract: Adrenal glands are unremarkable. Kidneys are normal, without renal calculi, focal lesion, or hydronephrosis. Bladder is unremarkable. Stomach/Bowel: Stomach is within normal limits. Appendix appears normal. No evidence of bowel wall thickening, distention, or inflammatory changes. Vascular/Lymphatic: Aortic atherosclerosis. Circumaortic renal vein. Abnormal nodes again seen in the upper abdomen.One is slightly larger now measuring 12 mm on image 62 of series 2. Previously 8 mm. Second small node just medial and superior to the first is similar at 8 mm on image 60. No new lymph node enlargement elsewhere. Few nonpathologic retroperitoneal nodes more caudal near the aortic bifurcation. Reproductive: Status post hysterectomy. No adnexal masses. Other: No abdominal wall hernia or abnormality. No abdominopelvic ascites. Musculoskeletal: No acute or significant osseous findings. IMPRESSION: Upper abdominal abnormal nodes are again seen. One is slightly larger today than previous. No new nodal enlargement. No developing ascites. Fatty liver  infiltration. Stable segmental collapse of the middle lobe. Electronically Signed   By: Ranell Bring M.D.   On: 03/27/2023 13:53      03/26/2023 Tumor Marker   Patient's tumor was tested for the following markers: CA-125. Results of the tumor marker test revealed 8.9.   06/29/2023 Imaging   *No acute findings in the abdomen or pelvis. *Moderate amount of residual fecal material throughout the colon without obstruction or constipation. *Status post hysterectomy. No adnexal masses.     01/15/2024 Imaging   CT RENAL STONE STUDY Result Date: 01/15/2024 CLINICAL DATA:  Abdominal/flank pain, stone suspected. History of ovarian cancer. EXAM: CT ABDOMEN AND PELVIS WITHOUT CONTRAST TECHNIQUE: Multidetector CT imaging of  the abdomen and pelvis was performed following the standard protocol without IV contrast. RADIATION DOSE REDUCTION: This exam was performed according to the departmental dose-optimization program which includes automated exposure control, adjustment of the mA and/or kV according to patient size and/or use of iterative reconstruction technique. COMPARISON:  06/29/2023 FINDINGS: Of note, the lack of intravenous contrast limits evaluation of the solid organ parenchyma and vascularity. Lower chest: No focal airspace consolidation or pleural effusion. Hepatobiliary: No mass.No radiopaque stones or wall thickening of the gallbladder. No intrahepatic or extrahepatic biliary ductal dilation. Pancreas: No mass or main ductal dilation. No peripancreatic inflammation or fluid collection. Spleen: New, hypoattenuating mass within the splenic hilum measuring 1.7 x 1.6 cm (axial 12). Adrenals/Urinary Tract: No adrenal masses. No renal mass. Mild left-sided hydroureteronephrosis. The urinary bladder is completely decompressed. Stomach/Bowel: The stomach is decompressed without focal abnormality. No small bowel wall thickening or inflammation. No small bowel obstruction.Normal appendix. Vascular/Lymphatic: No  aortic aneurysm. Scattered aortoiliac atherosclerosis. No intraabdominal or pelvic lymphadenopathy. Reproductive: Hysterectomy. No concerning adnexal mass. No free pelvic fluid. Other: No pneumoperitoneum, ascites, or mesenteric inflammation. Musculoskeletal: No acute fracture or destructive lesion. IMPRESSION: 1. Mild left-sided hydroureteronephrosis with abrupt narrowing in the distal left ureter along the left pelvic sidewall (axial 54). No obstructive ureteral calculi. This could represent changes from an ascending urinary tract infection or recently passed ureteral calculus. Nonemergent CT urogram should be considered to evaluate for either intraluminal lesion or extraluminal compression. 2. In the interim, a new hypoattenuating lesion has developed in the splenic hilum measuring 1.7 x 1.6 cm (axial 12). Given the patient's history of ovarian cancer, this is worrisome for new metastatic disease. Aortic Atherosclerosis (ICD10-I70.0). Electronically Signed   By: Rogelia Myers M.D.   On: 01/15/2024 13:59       CNS Oncologic History 01/22/21: R frontal craniotomy, resection with Dr. Carollee 02/21/21: Post-op SRS and SRS to L parietal target Cinda)  Interval History: Ericah Scotto presents for follow up after recent MRI brain. No further seizures, nor new or progressive changes.  Does have ongoing short term memory issues, fatigue, overall unchanged from prior.  Continues on Keppra  1000mg  twice per day.  Continues on observation with Dr. Lonn.  H+P (12/25/20) Patient presented to the ED on 12/22/20 with twitching of her left side of her face for 1 to 2 minutes, accompanied by slurring of speech.  There was no tongue biting, bowel or bladder incontinence.  Workup demonstrated two brain lesions consistent with suspected metastases.  She was started on Keppra  (no decadron ) and discharged two days later with no further seizure like events.  Priro to admission she had also been complaining of  dizziness, confusion, impaired memory.  Has been seeing Dr. Lonn since 2021 for primary peritoneal carcinoma.   Medications: Current Outpatient Medications on File Prior to Visit  Medication Sig Dispense Refill   Azelaic Acid  15 % cream Apply 1 application topically daily.     cromolyn  (NASALCROM ) 5.2 MG/ACT nasal spray Place 1 spray into both nostrils. (Patient taking differently: Place 1 spray into both nostrils 2 (two) times daily as needed.)     cyanocobalamin (VITAMIN B12) 500 MCG tablet Take 500 mcg by mouth daily.     gabapentin  (NEURONTIN ) 300 MG capsule 1 capsule. (Patient taking differently: Take 300 mg by mouth 2 (two) times daily.)     levETIRAcetam  (KEPPRA ) 500 MG tablet 2 tablets Orally every 12 hrs; Duration: 30 day(s)     lidocaine -prilocaine  (EMLA ) cream Apply to affected area(s)  as needed. 30 g 3   LORazepam  (ATIVAN ) 2 MG tablet Take 1 tablet (2 mg total) by mouth every 6 (six) hours as needed for seizure. (Patient not taking: Reported on 02/08/2024) 10 tablet 0   No current facility-administered medications on file prior to visit.    Allergies:  Allergies  Allergen Reactions   Carboplatin  Itching and Rash    Patient has a hypersensitivity reaction to carboplatin .  See note from 04/30/2021 at 1439.  Reaction symptoms included itching of mouth and ears, burning in chest, hypotension, and rash to neck and arms.  Patient unable to complete infusion.   Fentanyl  Palpitations    Palpitations , fever ,headahe, N/V   loud pounding heart beat    Vancomycin  Rash   Milk (Cow) Diarrhea and Nausea And Vomiting   Brimonidine Tartrate     Other reaction(s): blurred vision   Brimonidine Tartrate     Other reaction(s): blurred vision   Ditropan [Oxybutynin]    Milk-Related Compounds Diarrhea and Nausea And Vomiting   Other Other (See Comments)   Oxybutynin Chloride Other (See Comments)    Ditropan  Pt doesn't remember    Thimerosal (Thiomersal) Other (See Comments)    Pt  doesn't remember   Tramadol Other (See Comments)    Pt doesn't remember   Avelox [Moxifloxacin Hcl In Nacl] Rash   Doxycycline Rash   Erythromycin Rash   Metronidazole Rash   Minocycline Rash   Penicillins Rash    Did it involve swelling of the face/tongue/throat, SOB, or low BP? No Did it involve sudden or severe rash/hives, skin peeling, or any reaction on the inside of your mouth or nose? Yes, rash. Did you need to seek medical attention at a hospital or doctor's office? No When did it last happen?      Childhood allergy If all above answers are NO, may proceed with cephalosporin use.    Quinolones Rash   Sulfonamide Derivatives Rash   Past Medical History:  Past Medical History:  Diagnosis Date   Allergic rhinitis    Aortic atherosclerosis    Aortic insufficiency    mild by echo 04/2023   Cervical dysplasia 1980   Cervicalgia    Eczema    Rosacea,dermatitis-Dr Stinehelfer   Esophageal reflux    Family history of colon cancer    Family history of melanoma    Family history of ovarian cancer    Family history of pancreatic cancer    Family history of prostate cancer    Family history of uterine cancer    Fibromyalgia    GERD (gastroesophageal reflux disease)    Glaucoma, narrow-angle    Heart murmur    Hiatal hernia    Irritable bowel syndrome    LBBB (left bundle branch block)    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), unspecified site    DCIS   Mass of lung    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   Mitral valve disorders(424.0)    No MVP and trivial MR by echo 10/2021   Osteoporosis 12/2016   T score -2.3, 2014 T score -2.5 AP spine   PAC (premature atrial contraction) 2012   ,PVC's, and nonsustained atril tachycardia w aberration by heart monitor    Pulmonic valve insufficiency    mild by echo 8/23   PVC (premature ventricular contraction)    Rosacea    Stricture and stenosis of esophagus    Tricuspid insufficiency    mild by echo 10/2021  Past  Surgical History:  Past Surgical History:  Procedure Laterality Date   APPLICATION OF CRANIAL NAVIGATION N/A 01/22/2021   Procedure: APPLICATION OF CRANIAL NAVIGATION;  Surgeon: Dawley, Lani BROCKS, DO;  Location: [facility] OR;  Service: Neurosurgery;  Laterality: N/A;   BREAST LUMPECTOMY  1998   right breast with radiation   CERVICAL DISCECTOMY     C3-7 with bone graft, in three different surgeries, have titanium plate and screws   CRANIOTOMY N/A 01/22/2021   Procedure: STEREOTACTIC RIGHT FRONTAL CRANIOTOMY FOR RESECTION OF TUMOR;  Surgeon: Carollee Lani BROCKS, DO;  Location: [facility] OR;  Service: Neurosurgery;  Laterality: N/A;   GYNECOLOGIC CRYOSURGERY  1980   IR IMAGING GUIDED PORT INSERTION  12/24/2018   NECK SURGERY  1993-1997   bone graft and fusion   OMENTECTOMY  02/17/2019   REFRACTIVE SURGERY     narrow angle glucoma   RHINOPLASTY  1976   sterotactic large core needle biopsy right breast Right 10/15/1996   TUBAL LIGATION  1977   Social History:  Social History   Socioeconomic History   Marital status: Married    Spouse name: Dale   Number of children: 0   Years of education: Not on file   Highest education level: Not on file  Occupational History   Occupation: Educational Psychologist: [employer]  Tobacco Use   Smoking status: Never   Smokeless tobacco: Never  Vaping Use   Vaping status: Never Used  Substance and Sexual Activity   Alcohol use: Yes    Alcohol/week: 2.0 standard drinks of alcohol    Types: 2 Standard drinks or equivalent per week    Comment: Social    Drug use: No   Sexual activity: Not Currently    Birth control/protection: Post-menopausal    Comment: 1st intercourse 73 yo- More than 5 partners  Other Topics Concern   Not on file  Social History Narrative   Not on file   Social Drivers of Health   Financial Resource Strain: Not on file  Food Insecurity: Not on file  Transportation Needs: Not on file  Physical Activity: Not on file  Stress: Not on  file  Social Connections: Unknown (07/23/2021)   [facility]   Social Network    Social Network: Not on file  Intimate Partner Violence: Unknown (06/21/2021)   Received from [facility]   HITS    Physically Hurt: Not on file    Insult or Talk Down To: Not on file    Threaten Physical Harm: Not on file    Scream or Curse: Not on file   Family History:  Family History  Problem Relation Age of Onset   Heart disease Mother    Depression Mother    Hypertension Mother    Heart disease Father    Hypertension Father    Aneurysm Father    Uterine cancer Sister 50   Hypertension Sister    Ovarian cancer Sister 50   Melanoma Sister 74   Depression Sister    Melanoma Sister 31   Heart disease Paternal Grandfather    Stroke Maternal Grandmother    Cancer Maternal Grandfather 14       Pancreatic   Stroke Paternal Grandmother    Cancer Paternal Uncle        multiple myeloma, prostate cancer and melanoma (separate primaries)   Colon cancer Neg Hx     Review of Systems: Constitutional: Doesn't report fevers, chills or abnormal weight loss  Eyes: Doesn't report blurriness of vision Ears, nose, mouth, throat, and face: Doesn't report sore throat Respiratory: Doesn't report cough, dyspnea or wheezes Cardiovascular: Doesn't report palpitation, chest discomfort  Gastrointestinal:  Doesn't report nausea, constipation, diarrhea GU: Doesn't report incontinence Skin: Doesn't report skin rashes Neurological: Per HPI Musculoskeletal: Doesn't report joint pain Behavioral/Psych: Doesn't report anxiety  Physical Exam: Wt Readings from Last 3 Encounters:  02/08/24 91 lb 1.6 oz (41.3 kg)  01/28/24 92 lb 3.2 oz (41.8 kg)  01/25/24 91 lb (41.3 kg)   Temp Readings from Last 3 Encounters:  02/08/24 97.9 F (36.6 C) (Temporal)  01/28/24 98.4 F (36.9 C) (Oral)  01/25/24 98.9 F (37.2 C) (Tympanic)   BP Readings from Last 3 Encounters:  02/08/24 138/64  01/28/24 (!) 134/58   01/25/24 133/61   Pulse Readings from Last 3 Encounters:  02/08/24 82  01/28/24 78  01/25/24 74   KPS: 80. General: Alert, cooperative, pleasant, in no acute distress Head: Normal EENT: No conjunctival injection or scleral icterus.  Lungs: Resp effort normal Cardiac: Regular rate Abdomen: Non-distended abdomen Skin: No rashes cyanosis or petechiae. Extremities: No clubbing or edema  Neurologic Exam: Mental Status: Awake, alert, attentive to examiner. Oriented to self and environment. Language is fluent with intact comprehension.  Cranial Nerves: Visual acuity is grossly normal. Visual fields are full. Extra-ocular movements intact. No ptosis. Face is symmetric Motor: Tone and bulk are normal. Power is full in both arms and legs. Reflexes are symmetric, no pathologic reflexes present.  Sensory: Intact to light touch Gait: Normal.   Labs: I have reviewed the data as listed    Component Value Date/Time   NA 141 01/15/2024 1109   K 3.6 01/15/2024 1109   CL 101 01/15/2024 1109   CO2 28 01/15/2024 1109   GLUCOSE 100 (H) 01/15/2024 1109   BUN 9 01/15/2024 1109   CREATININE 0.59 01/15/2024 1109   CREATININE 0.57 11/06/2022 0741   CALCIUM 10.4 (H) 01/15/2024 1109   PROT 8.1 01/15/2024 1109   ALBUMIN 4.8 01/15/2024 1109   AST 25 01/15/2024 1109   AST 33 11/06/2022 0741   ALT 15 01/15/2024 1109   ALT 25 11/06/2022 0741   ALKPHOS 94 01/15/2024 1109   BILITOT 0.7 01/15/2024 1109   BILITOT 0.5 11/06/2022 0741   GFRNONAA >60 01/15/2024 1109   GFRNONAA >60 11/06/2022 0741   GFRAA >60 12/05/2019 1145   Lab Results  Component Value Date   WBC 10.4 01/15/2024   NEUTROABS 3.4 06/29/2023   HGB 13.1 01/15/2024   HCT 39.3 01/15/2024   MCV 90.1 01/15/2024   PLT 278 01/15/2024    Imaging:  [facility] Clinician Interpretation: I have personally reviewed the CNS images as listed.  My interpretation, in the context of the patient's clinical presentation, is treatment effect vs true  progression  MR BRAIN W WO CONTRAST Result Date: 02/03/2024 EXAM: MRI BRAIN WITH AND WITHOUT CONTRAST 02/03/2024 12:10:35 PM TECHNIQUE: Multiplanar multisequence MRI of the head/brain was performed with and without the administration of intravenous contrast. COMPARISON: MRI 11/19/2023 and earlier studies. CLINICAL HISTORY: 73 year old female with treated brain metastases. Metastatic right frontal brain tumor resection in 01/2021, SRS to that lesion and left parietal lesion. Restaging. FINDINGS: BRAIN AND VENTRICLES: No acute infarct. No mass effect or midline shift. No hydrocephalus. The sella is unremarkable. Normal flow voids. Following contrast, the major dural venous sinuses are enhancing and appear to be patent. No new intracranial blood products. Chronic right superior craniotomy. Underlying resection  cavity with irregular rim enhancement up to 7 mm thickness on coronal images (series 15 image 23), mildly progressed since July this year, subtle progression since July on axial images (such as series 14 image 128), minimally increased since September. Regional T2 and FLAIR hyperintensity is stable since July, moderate. No regional mass effect. Regional hemosiderin is stable. Contralateral small area of T2 and FLAIR hyperintensity in the left parietal lobe (series 10 image 40) corresponds to a post-treatment site there with unchanged minimal enhancement since 2023 (series 14 image 119). No new abnormal enhancement. No dural thickening. Partially visible chronic cervical spine ACDF. Negative visible cervical spinal cord. Negative visible internal auditory structures. ORBITS: No acute abnormality. SINUSES: No acute abnormality. BONES AND SOFT TISSUES: Normal bone marrow signal and enhancement. Bone marrow signal stable and with interval limits. No acute soft tissue abnormality. Chronic right superior vertex craniotomy. IMPRESSION: 1. Mildly increased since July and minimally increased since September  enhancement at chronic treated right frontal lesion site. Stable regional T2/FLAIR hyperintensity, without regional mass effect. 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 2. Stable minimal enhancement of treated left parietal lesion. 3. No new intracranial metastasis. Electronically signed by: Helayne Hurst MD 02/03/2024 12:34 PM EST RP Workstation: [id number]   CT RENAL STONE STUDY Result Date: 01/15/2024 CLINICAL DATA:  Abdominal/flank pain, stone suspected. History of ovarian cancer. EXAM: CT ABDOMEN AND PELVIS WITHOUT CONTRAST TECHNIQUE: Multidetector CT imaging of the abdomen and pelvis was performed following the standard protocol without IV contrast. RADIATION DOSE REDUCTION: This exam was performed according to the departmental dose-optimization program which includes automated exposure control, adjustment of the mA and/or kV according to patient size and/or use of iterative reconstruction technique. COMPARISON:  06/29/2023 FINDINGS: Of note, the lack of intravenous contrast limits evaluation of the solid organ parenchyma and vascularity. Lower chest: No focal airspace consolidation or pleural effusion. Hepatobiliary: No mass.No radiopaque stones or wall thickening of the gallbladder. No intrahepatic or extrahepatic biliary ductal dilation. Pancreas: No mass or main ductal dilation. No peripancreatic inflammation or fluid collection. Spleen: New, hypoattenuating mass within the splenic hilum measuring 1.7 x 1.6 cm (axial 12). Adrenals/Urinary Tract: No adrenal masses. No renal mass. Mild left-sided hydroureteronephrosis. The urinary bladder is completely decompressed. Stomach/Bowel: The stomach is decompressed without focal abnormality. No small bowel wall thickening or inflammation. No small bowel obstruction.Normal appendix. Vascular/Lymphatic: No aortic aneurysm. Scattered aortoiliac atherosclerosis. No intraabdominal or pelvic lymphadenopathy.  Reproductive: Hysterectomy. No concerning adnexal mass. No free pelvic fluid. Other: No pneumoperitoneum, ascites, or mesenteric inflammation. Musculoskeletal: No acute fracture or destructive lesion. IMPRESSION: 1. Mild left-sided hydroureteronephrosis with abrupt narrowing in the distal left ureter along the left pelvic sidewall (axial 54). No obstructive ureteral calculi. This could represent changes from an ascending urinary tract infection or recently passed ureteral calculus. Nonemergent CT urogram should be considered to evaluate for either intraluminal lesion or extraluminal compression. 2. In the interim, a new hypoattenuating lesion has developed in the splenic hilum measuring 1.7 x 1.6 cm (axial 12). Given the patient's history of ovarian cancer, this is worrisome for new metastatic disease. Aortic Atherosclerosis (ICD10-I70.0). Electronically Signed   By: Rogelia Myers M.D.   On: 01/15/2024 13:59     Assessment/Plan Secondary malignant neoplasm of brain (HCC)  Danaysha Kirn is clinically stable today.  MRI brain again demonstrates small focus of enhancement immediately adjacent to right frontal resection cavity, this continues to grow very slowly now over 4 consecutive studies.  The growth rate has  decreased compared to earlier intervals.  Etiology remains unclear, but favor treatment effect due to decrease growth rate and lack of T2 correlate.  Based on discussion in CNS tumor board, recommended close imaging surveillance at this time, with repeat MRI brain in 3 months.    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    Will con't to follow with Dr. Lonn, remains on observation.  We appreciate the opportunity to participate in the care of Anshu Wehner.    We ask that Solei Wubben return to clinic in 3 months following next brain MRI, or sooner as needed.    All questions were answered. The patient knows to call the clinic with any problems, questions or concerns.  No barriers to learning were detected.  The total time spent in the encounter was 40 minutes and more than 50% was on counseling and review of test results   Arthea MARLA Manns, MD Medical Director of Neuro-Oncology [facility] 02/08/24 9:52 AM

## 2024-02-09 ENCOUNTER — Telehealth: Payer: Self-pay | Admitting: Internal Medicine

## 2024-02-09 ENCOUNTER — Other Ambulatory Visit: Payer: Self-pay | Admitting: Urology

## 2024-02-09 NOTE — Telephone Encounter (Signed)
 Scheduled patient for next appointment. Called and spoke with the patient, she is aware.

## 2024-02-15 ENCOUNTER — Other Ambulatory Visit: Payer: Self-pay | Admitting: Radiation Therapy

## 2024-02-17 DIAGNOSIS — Z1231 Encounter for screening mammogram for malignant neoplasm of breast: Secondary | ICD-10-CM | POA: Diagnosis not present

## 2024-02-18 ENCOUNTER — Encounter: Payer: Self-pay | Admitting: Hematology and Oncology

## 2024-02-22 ENCOUNTER — Encounter (HOSPITAL_COMMUNITY): Payer: Self-pay

## 2024-02-22 NOTE — Patient Instructions (Signed)
 SURGICAL WAITING ROOM VISITATION  Patients having surgery or a procedure may have no more than 2 support people in the waiting area - these visitors may rotate.    Children under the age of 64 must have an adult with them who is not the patient.  Visitors with respiratory illnesses are discouraged from visiting and should remain at home.  If the patient needs to stay at the hospital during part of their recovery, the visitor guidelines for inpatient rooms apply. Pre-op nurse will coordinate an appropriate time for 1 support person to accompany patient in pre-op.  This support person may not rotate.    Please refer to the [facility] website for the visitor guidelines for Inpatients (after your surgery is over and you are in a regular room).       Your procedure is scheduled on: 02-29-24   Report to [facility] Main Entrance    Report to admitting at      0715  AM   Call this number if you have problems the morning of surgery [phone]   Do not eat food   or drink liquids   :After Midnight.                               If you have questions, please contact your surgeon's office.   FOLLOW ANY ADDITIONAL PRE OP INSTRUCTIONS YOU RECEIVED FROM YOUR SURGEON'S OFFICE!!!     Oral Hygiene is also important to reduce your risk of infection.                                    Remember - BRUSH YOUR TEETH THE MORNING OF SURGERY WITH YOUR REGULAR TOOTHPASTE  DENTURES WILL BE REMOVED PRIOR TO SURGERY PLEASE DO NOT APPLY Poly grip OR ADHESIVES!!!   Do NOT smoke after Midnight   Stop all vitamins and herbal supplements 7 days before surgery.   Take these medicines the morning of surgery with A SIP OF WATER: Keppra , gabapentin   DO NOT TAKE ANY ORAL DIABETIC MEDICATIONS DAY OF YOUR SURGERY  Bring CPAP mask and tubing day of surgery.                              You may not have any metal on your body including hair pins, jewelry, and body piercing             Do not  wear make-up, lotions, powders, perfumes/cologne, or deodorant  Do not wear nail polish including gel and S&S, artificial/acrylic nails, or any other type of covering on natural nails including finger and toenails. If you have artificial nails, gel coating, etc. that needs to be removed by a nail salon please have this removed prior to surgery or surgery may need to be canceled/ delayed if the surgeon/ anesthesia feels like they are unable to be safely monitored.   Do not shave  48 hours prior to surgery.               Do not bring valuables to the hospital. [facility] IS NOT             RESPONSIBLE   FOR VALUABLES.   Contacts, glasses, dentures or bridgework may not be worn into surgery.   Bring small overnight bag day of surgery.  DO NOT BRING YOUR HOME MEDICATIONS TO THE HOSPITAL. PHARMACY WILL DISPENSE MEDICATIONS LISTED ON YOUR MEDICATION LIST TO YOU DURING YOUR ADMISSION IN THE HOSPITAL!    Patients discharged on the day of surgery will not be allowed to drive home.  Someone NEEDS to stay with you for the first 24 hours after anesthesia.   Special Instructions: Bring a copy of your healthcare power of attorney and living will documents the day of surgery if you haven't scanned them before.              Please read over the following fact sheets you were given: IF YOU HAVE QUESTIONS ABOUT YOUR PRE-OP INSTRUCTIONS PLEASE CALL [phone].    If you test positive for Covid or have been in contact with anyone that has tested positive in the last 10 days please notify you surgeon.    [facility] - Preparing for Surgery Before surgery, you can play an important role.  Because skin is not sterile, your skin needs to be as free of germs as possible.  You can reduce the number of germs on your skin by washing with CHG (chlorahexidine gluconate) soap before surgery.  CHG is an antiseptic cleaner which kills germs and bonds with the skin to continue killing germs even after washing. Please DO  NOT use if you have an allergy to CHG or antibacterial soaps.  If your skin becomes reddened/irritated stop using the CHG and inform your nurse when you arrive at Short Stay. Do not shave (including legs and underarms) for at least 48 hours prior to the first CHG shower.  You may shave your face/neck.  Please follow these instructions carefully:  1.  Shower with CHG Soap the night before surgery and the morning of surgery.  2.  If you choose to wash your hair, wash your hair first as usual with your normal  shampoo.  3.  After you shampoo, rinse your hair and body thoroughly to remove the shampoo.                             4.  Use CHG as you would any other liquid soap.  You can apply chg directly to the skin and wash.  Gently with a scrungie or clean washcloth.  5.  Apply the CHG Soap to your body ONLY FROM THE NECK DOWN.   Do  not use on face/ open                           Wound or open sores. Avoid contact with eyes, ears mouth and genitals (private parts).                       Wash face,  Genitals (private parts) with your normal soap.             6.  Wash thoroughly, paying special attention to the area where your  surgery  will be performed.  7.  Thoroughly rinse your body with warm water from the neck down.  8.  DO NOT shower/wash with your normal soap after using and rinsing off the CHG Soap.                9.  Pat yourself dry with a clean towel.            10.  Wear clean pajamas.  11.  Place clean sheets on your bed the night of your first shower and do not  sleep with pets. Day of Surgery : Do not apply any CHG, lotions/deodorants the morning of surgery.  Please wear clean clothes to the hospital/surgery center.  FAILURE TO FOLLOW THESE INSTRUCTIONS MAY RESULT IN THE CANCELLATION OF YOUR SURGERY  PATIENT SIGNATURE_________________________________  NURSE  SIGNATURE__________________________________  ________________________________________________________________________

## 2024-02-22 NOTE — Progress Notes (Addendum)
 PCP - [name] COME Cardiologist - Traci Turner,MD LOV 04-15-23 epic Onco- [name], [name],MD LOV 02-08-24 PPM/ICD -  Device Orders -  Rep Notified -   Chest x-ray - CT chest abd/pelvis 03-27-23 epic EKG - 04-15-23 epic Stress Test -  ECHO - 05-06-23 epic Cardiac Cath -   Sleep Study -  CPAP -   Fasting Blood Sugar -  Checks Blood Sugar _____ times a day  Blood Thinner Instructions: Aspirin Instructions:  ERAS Protcol - PRE-SURGERY Ensure or G2-    COVID vaccine -  Activity-- Anesthesia review: LBBB, Murmur, PVC, peritoneal carcinoma  Patient denies shortness of breath, fever, cough and chest pain at PAT appointment   All instructions explained to the patient, with a verbal understanding of the material. Patient agrees to go over the instructions while at home for a better understanding. Patient also instructed to self quarantine after being tested for COVID-19. The opportunity to ask questions was provided.

## 2024-02-24 ENCOUNTER — Encounter (HOSPITAL_COMMUNITY): Payer: Self-pay

## 2024-02-24 ENCOUNTER — Encounter (HOSPITAL_COMMUNITY)
Admission: RE | Admit: 2024-02-24 | Discharge: 2024-02-24 | Disposition: A | Source: Ambulatory Visit | Attending: Urology

## 2024-02-24 ENCOUNTER — Other Ambulatory Visit: Payer: Self-pay

## 2024-02-24 VITALS — BP 137/72 | HR 77 | Temp 98.5°F | Ht 65.0 in | Wt 89.6 lb

## 2024-02-24 DIAGNOSIS — Z9221 Personal history of antineoplastic chemotherapy: Secondary | ICD-10-CM | POA: Insufficient documentation

## 2024-02-24 DIAGNOSIS — N131 Hydronephrosis with ureteral stricture, not elsewhere classified: Secondary | ICD-10-CM | POA: Insufficient documentation

## 2024-02-24 DIAGNOSIS — Z01818 Encounter for other preprocedural examination: Secondary | ICD-10-CM

## 2024-02-24 DIAGNOSIS — F341 Dysthymic disorder: Secondary | ICD-10-CM

## 2024-02-24 DIAGNOSIS — Z01812 Encounter for preprocedural laboratory examination: Secondary | ICD-10-CM | POA: Diagnosis not present

## 2024-02-24 DIAGNOSIS — Z8589 Personal history of malignant neoplasm of other organs and systems: Secondary | ICD-10-CM | POA: Insufficient documentation

## 2024-02-24 DIAGNOSIS — I351 Nonrheumatic aortic (valve) insufficiency: Secondary | ICD-10-CM | POA: Diagnosis not present

## 2024-02-24 DIAGNOSIS — Z85841 Personal history of malignant neoplasm of brain: Secondary | ICD-10-CM | POA: Diagnosis not present

## 2024-02-24 DIAGNOSIS — M810 Age-related osteoporosis without current pathological fracture: Secondary | ICD-10-CM | POA: Insufficient documentation

## 2024-02-24 HISTORY — DX: Anxiety disorder, unspecified: F41.9

## 2024-02-24 LAB — CBC
HCT: 35.1 % — ABNORMAL LOW (ref 36.0–46.0)
Hemoglobin: 11.5 g/dL — ABNORMAL LOW (ref 12.0–15.0)
MCH: 29.7 pg (ref 26.0–34.0)
MCHC: 32.8 g/dL (ref 30.0–36.0)
MCV: 90.7 fL (ref 80.0–100.0)
Platelets: 260 K/uL (ref 150–400)
RBC: 3.87 MIL/uL (ref 3.87–5.11)
RDW: 12.6 % (ref 11.5–15.5)
WBC: 6.5 K/uL (ref 4.0–10.5)
nRBC: 0 % (ref 0.0–0.2)

## 2024-02-24 LAB — BASIC METABOLIC PANEL WITH GFR
Anion gap: 11 (ref 5–15)
BUN: 13 mg/dL (ref 8–23)
CO2: 28 mmol/L (ref 22–32)
Calcium: 10 mg/dL (ref 8.9–10.3)
Chloride: 100 mmol/L (ref 98–111)
Creatinine, Ser: 0.81 mg/dL (ref 0.44–1.00)
GFR, Estimated: >60 mL/min (ref >60)
Glucose, Bld: 87 mg/dL (ref 70–99)
Potassium: 3.8 mmol/L (ref 3.5–5.1)
Sodium: 140 mmol/L (ref 135–145)

## 2024-02-25 NOTE — Anesthesia Preprocedure Evaluation (Signed)
 Anesthesia Evaluation  Patient identified by MRN, date of birth, ID band Patient awake    Reviewed: Allergy & Precautions, NPO status , Patient's Chart, lab work & pertinent test results  Airway Mallampati: II  TM Distance: >3 FB Neck ROM: Full    Dental no notable dental hx.    Pulmonary    Pulmonary exam normal        Cardiovascular negative cardio ROS + dysrhythmias  Rhythm:Regular Rate:Normal     Neuro/Psych  Headaches, Seizures -,   Anxiety Depression       GI/Hepatic Neg liver ROS, hiatal hernia,GERD  ,,  Endo/Other  negative endocrine ROS    Renal/GU negative Renal ROS  negative genitourinary   Musculoskeletal  (+)  Fibromyalgia -  Abdominal Normal abdominal exam  (+)   Peds  Hematology  (+) Blood dyscrasia, anemia   Anesthesia Other Findings   Reproductive/Obstetrics                              Anesthesia Physical Anesthesia Plan  ASA: 2  Anesthesia Plan: General   Post-op Pain Management:    Induction: Intravenous  PONV Risk Score and Plan: 3 and Ondansetron , Dexamethasone  and Treatment may vary due to age or medical condition  Airway Management Planned: Mask and LMA  Additional Equipment: None  Intra-op Plan:   Post-operative Plan: Extubation in OR  Informed Consent: I have reviewed the patients History and Physical, chart, labs and discussed the procedure including the risks, benefits and alternatives for the proposed anesthesia with the patient or authorized representative who has indicated his/her understanding and acceptance.     Dental advisory given  Plan Discussed with: CRNA  Anesthesia Plan Comments: (See PAT note from 12/10)         Anesthesia Quick Evaluation

## 2024-02-25 NOTE — Progress Notes (Addendum)
 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 Last seen on 01/25/24. She is status post neoadjuvant chemotherapy followed by surgery and completion of chemotherapy in March 2021. Per Dr. 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 04/30/21: She developed carboplatin  allergy, completed all chemotherapy by May 2023, with complete response with no evidence of disease  Recent imaging in late October 2025 for abdominal/flank pain showed changes suggestive of ascending urinary tract infection or recently passed ureteral calculus.  New hypoattenuating lesion has developed in the splenic hilum measuring up to 1.7 cm that is worrisome for new metastatic disease. Now scheduled for surgery above.  Follows with Cardiology for mild valvular disease and LBBB. Last seen by Dr. Shlomo on 04/15/23. Noted to be doing well from cardiac standpoint. Echo in 04/2023 showed normal LVEF, trivial MR, mild AI.  VS: BP 137/72   Pulse 77   Temp 36.9 C (Oral)   Ht 5' 5 (1.651 m)   Wt 40.6 kg   SpO2 99%   BMI 14.91 kg/m   PROVIDERS: Nanci Senior, MD   LABS: Labs reviewed: Acceptable for surgery. (all labs ordered are listed, but only  abnormal results are displayed)  Labs Reviewed  CBC - Abnormal; Notable for the following components:      Result Value   Hemoglobin 11.5 (*)    HCT 35.1 (*)    All other components within normal limits  BASIC METABOLIC PANEL WITH GFR      Echo 05/06/23:  IMPRESSIONS    1. Left ventricular ejection fraction, by estimation, is 50 to 55%. Left ventricular ejection fraction by 3D volume is 53 %. The left ventricle has low normal function. The left ventricle has no regional wall motion abnormalities. Left ventricular diastolic parameters are indeterminate.  2. Right ventricular systolic function is normal. The right ventricular size is normal. There is normal pulmonary artery systolic pressure. The estimated right ventricular systolic pressure is 24.5 mmHg.  3. The mitral valve is grossly normal. Trivial mitral valve regurgitation. No evidence of mitral stenosis.  4. The aortic valve is tricuspid. Aortic valve regurgitation is mild. No aortic stenosis is present.  5. The inferior vena cava is normal in size with greater than 50% respiratory variability, suggesting right atrial pressure of 3 mmHg.  CT calcium score 05/21/2022:   IMPRESSION: No visible coronary artery calcifications. Total coronary calcium score of 0.   Scattered aortic atherosclerosis.   Stable chronic right middle lobe atelectasis.   No acute extra cardiac abnormality Past Medical History:  Diagnosis Date   Allergic rhinitis    Anxiety    Aortic atherosclerosis    Aortic insufficiency    mild by echo 04/2023   Cervical dysplasia 1980   Cervicalgia  Eczema    Rosacea,dermatitis-Dr Stinehelfer   Esophageal reflux    Family history of colon cancer    Family history of melanoma    Family history of ovarian cancer    Family history of pancreatic cancer    Family history of prostate cancer    Family history of uterine cancer    Fibromyalgia    GERD (gastroesophageal reflux disease)    Glaucoma,  narrow-angle    Heart murmur    a long time ago eco 2025   Hiatal hernia    Irritable bowel syndrome    pt. 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   Mitral valve disorders(424.0)    No MVP and trivial MR by echo 10/2021   Osteoporosis 12/2016   T score -2.3, 2014 T score -2.5 AP spine   PAC (premature atrial contraction) 2012   ,PVC's, and nonsustained atril tachycardia w aberration by heart monitor    Pulmonic valve insufficiency    mild by echo 8/23   PVC (premature ventricular contraction)    Rosacea    Stricture and stenosis of esophagus    Tricuspid insufficiency    mild by echo 10/2021    Past Surgical History:  Procedure Laterality Date   ABDOMINAL HYSTERECTOMY     APPLICATION OF CRANIAL NAVIGATION N/A 01/22/2021   Procedure: APPLICATION OF CRANIAL NAVIGATION;  Surgeon: Carollee Lani BROCKS, DO;  Location: [facility];  Service: Neurosurgery;  Laterality: N/A;   BREAST LUMPECTOMY  1998   right breast with radiation   CERVICAL DISCECTOMY     C3-7 with bone graft, in three different surgeries, have titanium plate and screws   Crainotomy     removal of malignant tumor posterior R brain and frontal lobe   CRANIOTOMY N/A 01/22/2021   Procedure: STEREOTACTIC RIGHT FRONTAL CRANIOTOMY FOR RESECTION OF TUMOR;  Surgeon: Carollee Lani BROCKS, DO;  Location: [facility];  Service: Neurosurgery;  Laterality: N/A;   Dental bone surgery     EYE SURGERY     peripheral iridectomy bil laser   GYNECOLOGIC CRYOSURGERY  1980   IR IMAGING GUIDED PORT INSERTION  12/24/2018   NECK SURGERY  1993-1997   bone graft and fusion  fused c3-c7   Omenetectomy     OMENTECTOMY  02/17/2019   PARACENTESIS     to remove fliud due to peritoneal cancer   port of cath     REFRACTIVE SURGERY      narrow angle glucoma   RHINOPLASTY  1976   sterotactic large core needle biopsy right breast Right 10/15/1996   TUBAL LIGATION  1977    MEDICATIONS:  Azelaic Acid  15 % cream   Cholecalciferol (VITAMIN D3 PO)   cromolyn  (NASALCROM ) 5.2 MG/ACT nasal spray   cyanocobalamin (VITAMIN B12) 500 MCG tablet   gabapentin  (NEURONTIN ) 300 MG capsule   levETIRAcetam  (KEPPRA ) 500 MG tablet   lidocaine -prilocaine  (EMLA ) cream   No current facility-administered medications for this encounter.   Burnard CHRISTELLA Odis DEVONNA [facility] Short Stay/Anesthesiology [facility] Phone [phone] 02/25/2024 9:29 AM

## 2024-02-29 ENCOUNTER — Other Ambulatory Visit: Payer: Self-pay

## 2024-02-29 ENCOUNTER — Ambulatory Visit (HOSPITAL_COMMUNITY): Admitting: Registered Nurse

## 2024-02-29 ENCOUNTER — Ambulatory Visit (HOSPITAL_COMMUNITY)

## 2024-02-29 ENCOUNTER — Ambulatory Visit (HOSPITAL_COMMUNITY): Admission: RE | Admit: 2024-02-29 | Discharge: 2024-02-29 | Disposition: A | Attending: Urology | Admitting: Urology

## 2024-02-29 ENCOUNTER — Encounter (HOSPITAL_COMMUNITY): Payer: Self-pay | Admitting: Medical

## 2024-02-29 ENCOUNTER — Encounter (HOSPITAL_COMMUNITY): Payer: Self-pay | Admitting: Urology

## 2024-02-29 ENCOUNTER — Encounter (HOSPITAL_COMMUNITY): Admission: RE | Disposition: A | Payer: Self-pay | Source: Home / Self Care | Attending: Urology

## 2024-02-29 DIAGNOSIS — N131 Hydronephrosis with ureteral stricture, not elsewhere classified: Secondary | ICD-10-CM | POA: Insufficient documentation

## 2024-02-29 DIAGNOSIS — R569 Unspecified convulsions: Secondary | ICD-10-CM | POA: Diagnosis not present

## 2024-02-29 DIAGNOSIS — N135 Crossing vessel and stricture of ureter without hydronephrosis: Secondary | ICD-10-CM | POA: Diagnosis not present

## 2024-02-29 HISTORY — PX: CYSTOSCOPY/RETROGRADE/URETEROSCOPY: SHX5316

## 2024-02-29 HISTORY — PX: CYSTOSCOPY WITH BIOPSY: SHX5122

## 2024-02-29 HISTORY — PX: CYSTOSCOPY WITH URETEROSCOPY AND STENT PLACEMENT: SHX6377

## 2024-02-29 SURGERY — CYSTOURETEROSCOPY, WITH STENT INSERTION
Anesthesia: General | Site: Pelvis

## 2024-02-29 MED ORDER — PROPOFOL 10 MG/ML IV BOLUS
INTRAVENOUS | Status: AC
  Filled 2024-02-29: qty 20

## 2024-02-29 MED ORDER — CHLORHEXIDINE GLUCONATE 0.12 % MT SOLN
15.0000 mL | Freq: Once | OROMUCOSAL | Status: AC
  Administered 2024-02-29: 08:00:00 15 mL via OROMUCOSAL

## 2024-02-29 MED ORDER — ORAL CARE MOUTH RINSE: 15.0000 mL | Freq: Once | OROMUCOSAL | Status: AC

## 2024-02-29 MED ORDER — LACTATED RINGERS IV SOLN: INTRAVENOUS | Status: DC

## 2024-02-29 MED ORDER — FENTANYL CITRATE (PF) 100 MCG/2ML IJ SOLN
INTRAMUSCULAR | Status: AC
  Filled 2024-02-29: qty 2

## 2024-02-29 MED ORDER — HYDROMORPHONE HCL 1 MG/ML IJ SOLN: 0.2500 mg | INTRAMUSCULAR | Status: DC | PRN

## 2024-02-29 MED ORDER — FENTANYL CITRATE (PF) 100 MCG/2ML IJ SOLN
INTRAMUSCULAR | Status: DC | PRN
  Administered 2024-02-29: 11:00:00 25 ug via INTRAVENOUS

## 2024-02-29 MED ORDER — LIDOCAINE HCL (CARDIAC) PF 100 MG/5ML IV SOSY
PREFILLED_SYRINGE | INTRAVENOUS | Status: DC | PRN
  Administered 2024-02-29: 10:00:00 40 mg via INTRAVENOUS

## 2024-02-29 MED ORDER — LIDOCAINE HCL (PF) 2 % IJ SOLN
INTRAMUSCULAR | Status: AC
  Filled 2024-02-29: qty 5

## 2024-02-29 MED ORDER — SODIUM CHLORIDE 0.9 % IR SOLN
Status: DC | PRN
  Administered 2024-02-29: 10:00:00 3000 mL via INTRAVESICAL

## 2024-02-29 MED ORDER — DROPERIDOL 2.5 MG/ML IJ SOLN: 0.6250 mg | Freq: Once | INTRAMUSCULAR | Status: DC | PRN

## 2024-02-29 MED ORDER — ACETAMINOPHEN 10 MG/ML IV SOLN
INTRAVENOUS | Status: DC | PRN
  Administered 2024-02-29: 10:00:00 1000 mg via INTRAVENOUS

## 2024-02-29 MED ORDER — ONDANSETRON HCL 4 MG/2ML IJ SOLN
INTRAMUSCULAR | Status: DC | PRN
  Administered 2024-02-29: 10:00:00 4 mg via INTRAVENOUS

## 2024-02-29 MED ORDER — ACETAMINOPHEN 10 MG/ML IV SOLN: 1000.0000 mg | Freq: Once | INTRAVENOUS | Status: DC | PRN

## 2024-02-29 MED ORDER — PROPOFOL 1000 MG/100ML IV EMUL
INTRAVENOUS | Status: AC
  Filled 2024-02-29: qty 100

## 2024-02-29 MED ORDER — IOHEXOL 300 MG/ML  SOLN
INTRAMUSCULAR | Status: DC | PRN
  Administered 2024-02-29: 10:00:00 30 mL via URETHRAL

## 2024-02-29 MED ORDER — TAMSULOSIN HCL 0.4 MG PO CAPS: 0.4000 mg | ORAL_CAPSULE | Freq: Every day | ORAL | 1 refills | Status: AC

## 2024-02-29 MED ORDER — CEFAZOLIN SODIUM-DEXTROSE 2-4 GM/100ML-% IV SOLN
2.0000 g | INTRAVENOUS | Status: AC
  Administered 2024-02-29: 10:00:00 2 g via INTRAVENOUS
  Filled 2024-02-29: qty 100

## 2024-02-29 MED ORDER — HYDROCODONE-ACETAMINOPHEN 5-325 MG PO TABS: 1.0000 | ORAL_TABLET | Freq: Four times a day (QID) | ORAL | 0 refills | Status: AC | PRN

## 2024-02-29 MED ORDER — PROPOFOL 10 MG/ML IV BOLUS
INTRAVENOUS | Status: DC | PRN
  Administered 2024-02-29: 10:00:00 100 ug/kg/min via INTRAVENOUS
  Administered 2024-02-29: 11:00:00 50 mg via INTRAVENOUS
  Administered 2024-02-29: 10:00:00 80 mg via INTRAVENOUS

## 2024-02-29 MED ORDER — DEXAMETHASONE SODIUM PHOSPHATE 4 MG/ML IJ SOLN
INTRAMUSCULAR | Status: DC | PRN
  Administered 2024-02-29: 10:00:00 4 mg via INTRAVENOUS

## 2024-02-29 SURGICAL SUPPLY — 32 items
BAG URINE DRAIN 2000ML AR STRL (UROLOGICAL SUPPLIES) ×2 IMPLANT
BAG URO CATCHER STRL LF (MISCELLANEOUS) ×2 IMPLANT
BALLOON NEPHROSTOMY (BALLOONS) IMPLANT
BASKET LASER NITINOL 1.9FR (BASKET) IMPLANT
BASKET ZERO TIP NITINOL 2.4FR (BASKET) IMPLANT
CATH FOLEY 2WAY SLVR 5CC 18FR (CATHETERS) IMPLANT
CATH URETERAL DUAL LUMEN 10F (MISCELLANEOUS) IMPLANT
CATH URETL OPEN END 6FR 70 (CATHETERS) ×2 IMPLANT
CLOTH BEACON ORANGE TIMEOUT ST (SAFETY) ×2 IMPLANT
DRAPE FOOT SWITCH (DRAPES) ×2 IMPLANT
ELECT REM PT RETURN 15FT ADLT (MISCELLANEOUS) IMPLANT
GLOVE BIO SURGEON STRL SZ7.5 (GLOVE) ×2 IMPLANT
GOWN STRL REUS W/ TWL XL LVL3 (GOWN DISPOSABLE) ×2 IMPLANT
GUIDEWIRE ANG ZIPWIRE 038X150 (WIRE) IMPLANT
GUIDEWIRE STR DUAL SENSOR (WIRE) ×2 IMPLANT
KIT BALLIN UROMAX 15FX10 (LABEL) IMPLANT
KIT TURNOVER KIT A (KITS) ×2 IMPLANT
LOOP CUT BIPOLAR 24F LRG (ELECTROSURGICAL) IMPLANT
MANIFOLD NEPTUNE II (INSTRUMENTS) ×2 IMPLANT
NS IRRIG 1000ML POUR BTL (IV SOLUTION) IMPLANT
PACK CYSTO (CUSTOM PROCEDURE TRAY) ×2 IMPLANT
PAD TELFA 2X3 NADH STRL (GAUZE/BANDAGES/DRESSINGS) IMPLANT
PLUG CATH AND CAP STRL 200 (CATHETERS) IMPLANT
SCALPEL HALF MOON BLADE (MISCELLANEOUS) ×2 IMPLANT
SHEATH NAVIGATOR HD 11/13X28 (SHEATH) IMPLANT
SHEATH NAVIGATOR HD 11/13X36 (SHEATH) IMPLANT
STENT URET 6FRX24 CONTOUR (STENTS) IMPLANT
SYRINGE TOOMEY IRRIG 70ML (MISCELLANEOUS) IMPLANT
TRACTIP FLEXIVA PULS ID 200XHI (Laser) IMPLANT
TUBING CONNECTING 10 (TUBING) ×2 IMPLANT
TUBING UROLOGY SET (TUBING) ×2 IMPLANT
WATER STERILE IRR 3000ML UROMA (IV SOLUTION) ×2 IMPLANT

## 2024-02-29 NOTE — Anesthesia Postprocedure Evaluation (Signed)
 Anesthesia Post Note  Patient: [name] [name]  Procedure(s) Performed: ERNESTA, WITH STENT INSERTION (Left: Pelvis) CYSTOSCOPY, WITH BIOPSY (Bladder) CYSTOSCOPY/RETROGRADE/URETEROSCOPY (Left: Pelvis)     Patient location during evaluation: PACU Anesthesia Type: General Level of consciousness: awake and alert Pain management: pain level controlled Vital Signs Assessment: post-procedure vital signs reviewed and stable Respiratory status: spontaneous breathing, nonlabored ventilation, respiratory function stable and patient connected to nasal cannula oxygen Cardiovascular status: blood pressure returned to baseline and stable Postop Assessment: no apparent nausea or vomiting Anesthetic complications: no   No notable events documented.  Last Vitals:  Vitals:   02/29/24 1122 02/29/24 1130  BP: (!) 147/74 (!) 154/67  Pulse:  61  Resp: 16   Temp: 36.4 C   SpO2:  100%    Last Pain:  Vitals:   02/29/24 1130  TempSrc:   PainSc: 0-No pain                 [name]

## 2024-02-29 NOTE — Anesthesia Procedure Notes (Signed)
 Procedure Name: LMA Insertion Date/Time: 02/29/2024 10:01 AM  Performed by: Lanning Cena RAMAN, CRNAPre-anesthesia Checklist: Patient identified, Emergency Drugs available, Suction available and Patient being monitored Patient Re-evaluated:Patient Re-evaluated prior to induction Oxygen Delivery Method: Circle system utilized Preoxygenation: Pre-oxygenation with 100% oxygen Induction Type: IV induction LMA: LMA inserted LMA Size: 3.0 Number of attempts: 1 Placement Confirmation: positive ETCO2 Tube secured with: Tape Dental Injury: Teeth and Oropharynx as per pre-operative assessment

## 2024-02-29 NOTE — Transfer of Care (Signed)
 Immediate Anesthesia Transfer of Care Note  Patient: [name] [name]  Procedure(s) Performed: ERNESTA, WITH STENT INSERTION (Left: Pelvis) CYSTOSCOPY, WITH BIOPSY (Bladder) CYSTOSCOPY/RETROGRADE/URETEROSCOPY (Left: Pelvis)  Patient Location: PACU  Anesthesia Type:General  Level of Consciousness: drowsy  Airway & Oxygen Therapy: Patient Spontanous Breathing and Patient connected to face mask oxygen  Post-op Assessment: Report given to RN and Post -op Vital signs reviewed and stable  Post vital signs: Reviewed and stable  Last Vitals:  Vitals Value Taken Time  BP 129/62 02/29/24 10:45  Temp    Pulse 72 02/29/24 10:48  Resp 13 02/29/24 10:48  SpO2 100 % 02/29/24 10:48  Vitals shown include unfiled device data.  Last Pain:  Vitals:   02/29/24 0731  TempSrc:   PainSc: 0-No pain      Patients Stated Pain Goal: 5 (02/29/24 0731)  Complications: No notable events documented.

## 2024-02-29 NOTE — Interval H&P Note (Signed)
 History and Physical Interval Note:  02/29/2024 9:24 AM  [name] [name]  has presented today for surgery, with the diagnosis of HYDRONEPHROSIS.  The various methods of treatment have been discussed with the patient and family. After consideration of risks, benefits and other options for treatment, the patient has consented to cystoscopy with left diagnostic ureteroscopy with possible interventions such as balloon dilation, biopsy, lithotripsy, ureteral stent placement as a surgical intervention.  The patient's history has been reviewed, patient examined, no change in status, stable for surgery.  I have reviewed the patient's chart and labs.  Questions were answered to the patient's satisfaction.     Sherwood [name], III

## 2024-02-29 NOTE — Op Note (Signed)
 Operative Note  Preoperative diagnosis:  1.  Left hydronephrosis  Postoperative diagnosis: 1.  Left hydronephrosis secondary to distal ureteral stricture  Procedure(s): 1.  Cystoscopy with left retrograde pyelogram, left diagnostic ureteroscopy with balloon dilation of ureteral stricture, ureteral stent placement  Surgeon: Sherwood Edison, MD  Assistants: None  Anesthesia: General  Complications: None immediate  EBL: Minimal  Specimens: 1.  None  Drains/Catheters: 1.  6 x 26 double-J ureteral stent  Intraoperative findings: 1.  Normal urethra and bladder mucosa 2.  Left retrograde pyelogram revealed mid to distal 1 cm ureteral stricture with upstream hydroureteronephrosis. 3.  Ureteroscopy confirmed a mid to distal ureteral stricture that was dilated with a 15 French ureteral balloon for 5 minutes.  Ureteroscopy after dilation revealed an open ureter.  The strictured segment was about 1 cm.  Remainder of ureter clear of stricture, stone, and there were no tumors throughout.  Indication: 73 year old female with left hydronephrosis presents for the previously mentioned operation.  Description of procedure:  The patient was identified and consent was obtained.  The patient was taken to the operating room and placed in the supine position.  The patient was placed under general anesthesia.  Perioperative antibiotics were administered.  The patient was placed in dorsal lithotomy.  Patient was prepped and draped in a standard sterile fashion and a timeout was performed.  A 21 French rigid cystoscope was advanced into the urethra and into the bladder.  Complete cystoscopy was performed as noted above.  Left ureter was cannulated with a sensor wire which was passed up to the kidney under fluoroscopic guidance.  Semirigid ureteroscopy was performed alongside the wire up to the strictured segment.  I was not able to pass it with the scope.  I shot a retrograde pyelogram through the scope but  confirmed an approximate 1 cm short strictured segment.  I withdrew the scope and advanced a 10 cm balloon past the strictured segment and inflated the balloon to a pressure of 18.  I kept this dilated for approximately 5 minutes but then released the balloon and removed it.  Semirigid ureteroscopy was again performed and I was easily able to transversed the strictured segment and it appeared wide open.  This segment was approximately 1 cm in length.  There was no tumor or stone present.  I advanced scope up to the renal pelvis and no other abnormality was seen.  I shot a retrograde pyelogram again through the scope that showed some mild to moderate hydronephrosis but no filling defect in the kidney.  There is mild kinking at the proximal ureter.  I withdrew the scope and backloaded the wire onto a rigid cystoscope and passed that into the bladder followed by routine placement of a 6 x 24 double-J ureteral stent.  Fluoroscopy confirmed proximal placement and direct visualization confirmed a good coil within the bladder.  I drained the bladder and withdrew the scope.  Patient tolerated the procedure well was stable postoperatively.  Plan: Follow-up in 2 to 4 weeks for stent removal

## 2024-02-29 NOTE — Discharge Instructions (Signed)

## 2024-03-01 ENCOUNTER — Encounter (HOSPITAL_COMMUNITY): Payer: Self-pay | Admitting: Urology

## 2024-03-31 ENCOUNTER — Other Ambulatory Visit

## 2024-03-31 ENCOUNTER — Telehealth: Payer: Self-pay

## 2024-03-31 ENCOUNTER — Inpatient Hospital Stay: Attending: Gynecologic Oncology

## 2024-03-31 ENCOUNTER — Ambulatory Visit (HOSPITAL_COMMUNITY)
Admission: RE | Admit: 2024-03-31 | Discharge: 2024-03-31 | Disposition: A | Discharge status: Home / Self Care | Source: Ambulatory Visit | Attending: Hematology and Oncology | Admitting: Hematology and Oncology

## 2024-03-31 DIAGNOSIS — N1330 Unspecified hydronephrosis: Secondary | ICD-10-CM | POA: Diagnosis not present

## 2024-03-31 DIAGNOSIS — C482 Malignant neoplasm of peritoneum, unspecified: Secondary | ICD-10-CM | POA: Insufficient documentation

## 2024-03-31 DIAGNOSIS — G629 Polyneuropathy, unspecified: Secondary | ICD-10-CM | POA: Diagnosis not present

## 2024-03-31 DIAGNOSIS — C7931 Secondary malignant neoplasm of brain: Secondary | ICD-10-CM | POA: Diagnosis not present

## 2024-03-31 DIAGNOSIS — Z5111 Encounter for antineoplastic chemotherapy: Secondary | ICD-10-CM | POA: Insufficient documentation

## 2024-03-31 LAB — CBC WITH DIFFERENTIAL/PLATELET
Abs Immature Granulocytes: 0.01 K/uL (ref 0.00–0.07)
Basophils Absolute: 0 K/uL (ref 0.0–0.1)
Basophils Relative: 1 %
Eosinophils Absolute: 0 K/uL (ref 0.0–0.5)
Eosinophils Relative: 0 %
HCT: 31.6 % — ABNORMAL LOW (ref 36.0–46.0)
Hemoglobin: 10.8 g/dL — ABNORMAL LOW (ref 12.0–15.0)
Immature Granulocytes: 0 %
Lymphocytes Relative: 27 %
Lymphs Abs: 1.7 K/uL (ref 0.7–4.0)
MCH: 29.3 pg (ref 26.0–34.0)
MCHC: 34.2 g/dL (ref 30.0–36.0)
MCV: 85.6 fL (ref 80.0–100.0)
Monocytes Absolute: 0.4 K/uL (ref 0.1–1.0)
Monocytes Relative: 7 %
Neutro Abs: 4.1 K/uL (ref 1.7–7.7)
Neutrophils Relative %: 65 %
Platelets: 234 K/uL (ref 150–400)
RBC: 3.69 MIL/uL — ABNORMAL LOW (ref 3.87–5.11)
RDW: 12.4 % (ref 11.5–15.5)
WBC: 6.2 K/uL (ref 4.0–10.5)
nRBC: 0 % (ref 0.0–0.2)

## 2024-03-31 LAB — COMPREHENSIVE METABOLIC PANEL WITH GFR
ALT: 15 U/L (ref 0–44)
AST: 28 U/L (ref 15–41)
Albumin: 4.5 g/dL (ref 3.5–5.0)
Alkaline Phosphatase: 83 U/L (ref 38–126)
Anion gap: 13 (ref 5–15)
BUN: 12 mg/dL (ref 8–23)
CO2: 26 mmol/L (ref 22–32)
Calcium: 9.5 mg/dL (ref 8.9–10.3)
Chloride: 95 mmol/L — ABNORMAL LOW (ref 98–111)
Creatinine, Ser: 0.72 mg/dL (ref 0.44–1.00)
GFR, Estimated: >60 mL/min
Glucose, Bld: 89 mg/dL (ref 70–99)
Potassium: 3.8 mmol/L (ref 3.5–5.1)
Sodium: 134 mmol/L — ABNORMAL LOW (ref 135–145)
Total Bilirubin: 0.5 mg/dL (ref 0.0–1.2)
Total Protein: 7.6 g/dL (ref 6.5–8.1)

## 2024-03-31 MED ORDER — IOHEXOL 300 MG/ML  SOLN: 100.0000 mL | Freq: Once | INTRAMUSCULAR | Status: DC | PRN

## 2024-03-31 MED ORDER — HEPARIN SOD (PORK) LOCK FLUSH 100 UNIT/ML IV SOLN
500.0000 [IU] | Freq: Once | INTRAVENOUS | Status: AC
  Administered 2024-03-31: 500 [IU] via INTRAVENOUS

## 2024-03-31 MED ORDER — IOHEXOL 300 MG/ML  SOLN
80.0000 mL | Freq: Once | INTRAMUSCULAR | Status: AC | PRN
  Administered 2024-03-31: 80 mL via INTRAVENOUS

## 2024-03-31 MED ORDER — HEPARIN SOD (PORK) LOCK FLUSH 100 UNIT/ML IV SOLN
INTRAVENOUS | Status: AC
  Filled 2024-03-31: qty 5

## 2024-03-31 NOTE — Telephone Encounter (Signed)
 Called and scheduled appt tomorrow at 1:30 pm to review CT. She is aware of appt.

## 2024-04-01 ENCOUNTER — Inpatient Hospital Stay: Admitting: Hematology and Oncology

## 2024-04-01 ENCOUNTER — Inpatient Hospital Stay (HOSPITAL_BASED_OUTPATIENT_CLINIC_OR_DEPARTMENT_OTHER): Admitting: Hematology and Oncology

## 2024-04-01 VITALS — BP 151/75 | HR 88 | Resp 18 | Ht 65.0 in | Wt 88.0 lb

## 2024-04-01 DIAGNOSIS — C482 Malignant neoplasm of peritoneum, unspecified: Secondary | ICD-10-CM

## 2024-04-01 DIAGNOSIS — K5909 Other constipation: Secondary | ICD-10-CM | POA: Diagnosis not present

## 2024-04-01 DIAGNOSIS — N1330 Unspecified hydronephrosis: Secondary | ICD-10-CM

## 2024-04-01 DIAGNOSIS — Z5111 Encounter for antineoplastic chemotherapy: Secondary | ICD-10-CM | POA: Diagnosis not present

## 2024-04-01 LAB — CA 125: Cancer Antigen (CA) 125: 51.2 U/mL — ABNORMAL HIGH (ref 0.0–38.1)

## 2024-04-01 MED ORDER — OXYCODONE HCL 5 MG PO TABS: 5.0000 mg | ORAL_TABLET | ORAL | 0 refills | Status: AC | PRN

## 2024-04-01 NOTE — Progress Notes (Signed)
 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 04/30/21: She developed carboplatin  allergy, completed all chemotherapy by May 2023, with complete response with no evidence of disease  I have spent extensive amount of time reviewing multiple imaging study comparing her imaging study from 2025 with her latest imaging from January 2026 Overall, she has definitive signs of cancer recurrence with new disease in the pelvic lymph node, abnormal areas of her spleen, hydronephrosis of the left kidney and elevated tumor marker At this point in time, I do not believe we need biopsy to confirm recurrent disease  The patient had developed carboplatin  allergy in May 2023 We discussed combination chemotherapy with bevacizumab with either docetaxel, gemcitabine , paclitaxel  or topotecan We have extensive discussions about side effects to be expected Ultimately, she is undecided The patient is somewhat reluctant to consider taxanes due to risk of peripheral neuropathy She has 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  Hydronephrosis of left kidney She had recent hydronephrosis of the left kidney She underwent cystoscopy and stent placement recently but that  was removed approximately 10 days ago CT imaging from yesterday showed persistent left-sided hydronephrosis The patient recalls significant discomfort with stent placement We discussed risk and benefits of pursuing repeat stent placement but given normal renal function, I think it is reasonable not to pursue additional treatment Ultimately, she agrees not to pursue repeat procedure I will reach out to her urologist to inform him of recent CT finding and our discussion today Other constipation She felt bloated with recent poor appetite CT imaging reveals signs of constipation We discussed importance of laxative therapy and changes in her diet  No orders of the defined types were placed in this encounter.    Almarie Bedford, MD  INTERVAL HISTORY: she returns for surveillance follow-up with her husband We have a very long discussion today, amounting to total of 70 minutes She had recent discomfort on the left flank and underwent left retrograde pyelogram, left diagnostic ureteroscopy with balloon dilation of the ureteric stricture and stent placement Her stent was subsequently removed She still having some intermittent abdominal discomfort I reviewed blood work and imaging study with the patient and her husband and we have long discussion about treatment options  PHYSICAL EXAMINATION: ECOG PERFORMANCE STATUS: 1 - Symptomatic but completely ambulatory  Vitals:   04/01/24 1301  BP: (!) 151/75  Pulse: 88  Resp: 18  SpO2: 100%   Filed Weights   04/01/24 1301  Weight: 88 lb (39.9 kg)    Relevant data reviewed during this visit included CBC, CMP, CT imaging

## 2024-04-01 NOTE — Assessment & Plan Note (Addendum)
 She felt bloated with recent poor appetite CT imaging reveals signs of constipation We discussed importance of laxative therapy and changes in her diet

## 2024-04-01 NOTE — Assessment & Plan Note (Addendum)
 She had recent hydronephrosis of the left kidney She underwent cystoscopy and stent placement recently but that was removed approximately 10 days ago CT imaging from yesterday showed persistent left-sided hydronephrosis The patient recalls significant discomfort with stent placement We discussed risk and benefits of pursuing repeat stent placement but given normal renal function, I think it is reasonable not to pursue additional treatment Ultimately, she agrees not to pursue repeat procedure I will reach out to her urologist to inform him of recent CT finding and our discussion today

## 2024-04-01 NOTE — Assessment & Plan Note (Addendum)
 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 04/30/21: She developed carboplatin  allergy, completed all chemotherapy by May 2023, with complete response with no evidence of disease  I have spent extensive amount of time reviewing multiple imaging study comparing her imaging study from 2025 with her latest imaging from January 2026 Overall, she has definitive signs of cancer recurrence with new disease in the pelvic lymph node, abnormal areas of her spleen, hydronephrosis of the left kidney and elevated tumor marker At this point in time, I do not believe we need biopsy to confirm recurrent disease  The patient had developed carboplatin  allergy in May 2023 We discussed combination chemotherapy with bevacizumab with either docetaxel, gemcitabine , paclitaxel  or topotecan We have extensive discussions about side effects to be expected Ultimately, she is undecided The patient is somewhat reluctant to consider taxanes due to risk of peripheral neuropathy She has 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

## 2024-04-04 ENCOUNTER — Other Ambulatory Visit: Payer: Self-pay | Admitting: Hematology and Oncology

## 2024-04-04 ENCOUNTER — Telehealth: Payer: Self-pay

## 2024-04-04 ENCOUNTER — Encounter: Payer: Self-pay | Admitting: Hematology and Oncology

## 2024-04-04 DIAGNOSIS — C482 Malignant neoplasm of peritoneum, unspecified: Secondary | ICD-10-CM

## 2024-04-04 MED ORDER — PROCHLORPERAZINE MALEATE 10 MG PO TABS: 10.0000 mg | ORAL_TABLET | Freq: Four times a day (QID) | ORAL | 1 refills | Status: AC | PRN

## 2024-04-04 MED ORDER — LIDOCAINE-PRILOCAINE 2.5-2.5 % EX CREA: TOPICAL_CREAM | CUTANEOUS | 3 refills | Status: AC

## 2024-04-04 MED ORDER — ONDANSETRON HCL 8 MG PO TABS: 8.0000 mg | ORAL_TABLET | Freq: Three times a day (TID) | ORAL | 1 refills | Status: AC | PRN

## 2024-04-04 NOTE — Telephone Encounter (Signed)
 I created chemo plan to start on 1/27 Please work with scheduler to get her appt scheduled for 3 treatment I do not need to see her on day 1

## 2024-04-04 NOTE — Telephone Encounter (Signed)
 Returned her call. She has decided to get Gemcitabine . She prefers Tuesdays and would like an am appt, but not too early.

## 2024-04-05 ENCOUNTER — Other Ambulatory Visit: Payer: Self-pay

## 2024-04-05 ENCOUNTER — Encounter: Payer: Self-pay | Admitting: Hematology and Oncology

## 2024-04-05 NOTE — Progress Notes (Signed)
 Pharmacist Chemotherapy Monitoring - Initial Assessment    Anticipated start date: 04/12/24   The following has been reviewed per standard work regarding the patient's treatment regimen: The patient's diagnosis, treatment plan and drug doses, and organ/hematologic function Lab orders and baseline tests specific to treatment regimen  The treatment plan start date, drug sequencing, and pre-medications Prior authorization status  Patient's documented medication list, including drug-drug interaction screen and prescriptions for anti-emetics and supportive care specific to the treatment regimen The drug concentrations, fluid compatibility, administration routes, and timing of the medications to be used The patient's access for treatment and lifetime cumulative dose history, if applicable  The patient's medication allergies and previous infusion related reactions, if applicable   Changes made to treatment plan:  N/A  Follow up needed:  N/A   [name] [name], Pharm.D., CPP 04/05/2024@3 :48 PM

## 2024-04-06 ENCOUNTER — Telehealth: Payer: Self-pay

## 2024-04-06 ENCOUNTER — Other Ambulatory Visit: Payer: Self-pay

## 2024-04-06 NOTE — Telephone Encounter (Signed)
 Called and requested to speak with Dr. Rufina nurse, Erminio, regarding an issue with scheduling

## 2024-04-08 ENCOUNTER — Inpatient Hospital Stay: Admitting: Hematology and Oncology

## 2024-04-12 ENCOUNTER — Inpatient Hospital Stay

## 2024-04-12 VITALS — BP 129/56 | HR 68 | Temp 98.1°F | Resp 18 | Wt 88.0 lb

## 2024-04-12 DIAGNOSIS — Z5111 Encounter for antineoplastic chemotherapy: Secondary | ICD-10-CM | POA: Diagnosis not present

## 2024-04-12 DIAGNOSIS — C482 Malignant neoplasm of peritoneum, unspecified: Secondary | ICD-10-CM

## 2024-04-12 LAB — CBC WITH DIFFERENTIAL (CANCER CENTER ONLY)
Abs Immature Granulocytes: 0.02 10*3/uL (ref 0.00–0.07)
Basophils Absolute: 0 10*3/uL (ref 0.0–0.1)
Basophils Relative: 1 %
Eosinophils Absolute: 0.1 10*3/uL (ref 0.0–0.5)
Eosinophils Relative: 1 %
HCT: 33.8 % — ABNORMAL LOW (ref 36.0–46.0)
Hemoglobin: 11.2 g/dL — ABNORMAL LOW (ref 12.0–15.0)
Immature Granulocytes: 0 %
Lymphocytes Relative: 26 %
Lymphs Abs: 2.1 10*3/uL (ref 0.7–4.0)
MCH: 29.1 pg (ref 26.0–34.0)
MCHC: 33.1 g/dL (ref 30.0–36.0)
MCV: 87.8 fL (ref 80.0–100.0)
Monocytes Absolute: 0.6 10*3/uL (ref 0.1–1.0)
Monocytes Relative: 7 %
Neutro Abs: 5.2 10*3/uL (ref 1.7–7.7)
Neutrophils Relative %: 65 %
Platelet Count: 249 10*3/uL (ref 150–400)
RBC: 3.85 MIL/uL — ABNORMAL LOW (ref 3.87–5.11)
RDW: 13.2 % (ref 11.5–15.5)
WBC Count: 8 10*3/uL (ref 4.0–10.5)
nRBC: 0 % (ref 0.0–0.2)

## 2024-04-12 LAB — CMP (CANCER CENTER ONLY)
ALT: 15 U/L (ref 0–44)
AST: 27 U/L (ref 15–41)
Albumin: 4.8 g/dL (ref 3.5–5.0)
Alkaline Phosphatase: 81 U/L (ref 38–126)
Anion gap: 12 (ref 5–15)
BUN: 12 mg/dL (ref 8–23)
CO2: 27 mmol/L (ref 22–32)
Calcium: 9.8 mg/dL (ref 8.9–10.3)
Chloride: 98 mmol/L (ref 98–111)
Creatinine: 0.79 mg/dL (ref 0.44–1.00)
GFR, Estimated: >60 mL/min
Glucose, Bld: 89 mg/dL (ref 70–99)
Potassium: 3.9 mmol/L (ref 3.5–5.1)
Sodium: 137 mmol/L (ref 135–145)
Total Bilirubin: 0.4 mg/dL (ref 0.0–1.2)
Total Protein: 7.8 g/dL (ref 6.5–8.1)

## 2024-04-12 MED ORDER — SODIUM CHLORIDE 0.9 % IV SOLN
800.0000 mg/m2 | Freq: Once | INTRAVENOUS | Status: AC
  Administered 2024-04-12: 1064 mg via INTRAVENOUS
  Filled 2024-04-12: qty 27.98

## 2024-04-12 MED ORDER — SODIUM CHLORIDE 0.9 % IV SOLN: INTRAVENOUS | Status: DC

## 2024-04-12 MED ORDER — PROCHLORPERAZINE MALEATE 10 MG PO TABS
10.0000 mg | ORAL_TABLET | Freq: Once | ORAL | Status: AC
  Administered 2024-04-12: 10 mg via ORAL
  Filled 2024-04-12: qty 1

## 2024-04-12 NOTE — Patient Instructions (Signed)

## 2024-04-14 ENCOUNTER — Ambulatory Visit: Admitting: Hematology and Oncology

## 2024-04-15 ENCOUNTER — Telehealth: Payer: Self-pay

## 2024-04-15 ENCOUNTER — Other Ambulatory Visit (HOSPITAL_COMMUNITY): Payer: Self-pay

## 2024-04-15 NOTE — Telephone Encounter (Signed)
 Returned her call. She spoke with pharmacy and they told her to EMLA  cream needs a PA. Forwarded a message to the PA team.

## 2024-04-15 NOTE — Telephone Encounter (Signed)
 Oral Oncology Patient Advocate Encounter   Received notification that prior authorization for Lido/Prilo Cream is required.   PA submitted on 04/15/2024 Key [id number] Status is pending     Charlott Hamilton,  CPhT-Adv  she/her/hers [facility]  [facility] Pharmacy Technician Patient Advocate Specialist III [facility] Phone: [phone]  Fax: [phone] [email or web address]

## 2024-04-18 NOTE — Telephone Encounter (Signed)
 Oral Oncology Patient Advocate Encounter  Prior Authorization for Lid/Prilo Cream  has been approved.    PA# [id number] Effective dates: 04/15/2024 through 04/15/2025   Patient has been notified via MyChart   Charlott Hamilton,  CPhT-Adv  she/her/hers [facility]  [facility] Pharmacy Technician Patient Advocate Specialist III [facility] Phone: [phone]  Fax: [phone] [email or web address]

## 2024-04-26 ENCOUNTER — Inpatient Hospital Stay

## 2024-04-26 ENCOUNTER — Inpatient Hospital Stay: Admitting: Hematology and Oncology

## 2024-04-26 ENCOUNTER — Inpatient Hospital Stay: Attending: Gynecologic Oncology

## 2024-05-02 ENCOUNTER — Inpatient Hospital Stay

## 2024-05-05 ENCOUNTER — Other Ambulatory Visit

## 2024-05-09 ENCOUNTER — Inpatient Hospital Stay: Admitting: Internal Medicine

## 2024-05-09 ENCOUNTER — Inpatient Hospital Stay

## 2024-05-10 ENCOUNTER — Inpatient Hospital Stay: Admitting: Hematology and Oncology

## 2024-05-10 ENCOUNTER — Inpatient Hospital Stay

## 2024-05-10 ENCOUNTER — Inpatient Hospital Stay: Attending: Gynecologic Oncology

## 2024-07-28 ENCOUNTER — Inpatient Hospital Stay: Admitting: Gynecologic Oncology

## 2024-07-28 ENCOUNTER — Inpatient Hospital Stay

## 2025-01-27 ENCOUNTER — Inpatient Hospital Stay

## 2025-01-27 ENCOUNTER — Inpatient Hospital Stay: Admitting: Gynecologic Oncology
